# Patient Record
Sex: Male | Born: 1955
Health system: Southern US, Community
[De-identification: ages and names within clinical notes are randomized; demographics above are authoritative.]

## PROBLEM LIST (undated history)

## (undated) DIAGNOSIS — G4733 Obstructive sleep apnea (adult) (pediatric): Secondary | ICD-10-CM

## (undated) DIAGNOSIS — J45909 Unspecified asthma, uncomplicated: Secondary | ICD-10-CM

## (undated) DIAGNOSIS — E039 Hypothyroidism, unspecified: Secondary | ICD-10-CM

## (undated) DIAGNOSIS — I82409 Acute embolism and thrombosis of unspecified deep veins of unspecified lower extremity: Secondary | ICD-10-CM

## (undated) DIAGNOSIS — A078 Other specified protozoal intestinal diseases: Secondary | ICD-10-CM

## (undated) DIAGNOSIS — Z9289 Personal history of other medical treatment: Secondary | ICD-10-CM

## (undated) DIAGNOSIS — B354 Tinea corporis: Secondary | ICD-10-CM

## (undated) DIAGNOSIS — I498 Other specified cardiac arrhythmias: Secondary | ICD-10-CM

## (undated) DIAGNOSIS — E119 Type 2 diabetes mellitus without complications: Secondary | ICD-10-CM

## (undated) DIAGNOSIS — M1990 Unspecified osteoarthritis, unspecified site: Secondary | ICD-10-CM

## (undated) DIAGNOSIS — G5600 Carpal tunnel syndrome, unspecified upper limb: Secondary | ICD-10-CM

## (undated) DIAGNOSIS — F17200 Nicotine dependence, unspecified, uncomplicated: Secondary | ICD-10-CM

## (undated) DIAGNOSIS — T7840XA Allergy, unspecified, initial encounter: Secondary | ICD-10-CM

## (undated) DIAGNOSIS — E785 Hyperlipidemia, unspecified: Secondary | ICD-10-CM

## (undated) DIAGNOSIS — N419 Inflammatory disease of prostate, unspecified: Secondary | ICD-10-CM

## (undated) DIAGNOSIS — K219 Gastro-esophageal reflux disease without esophagitis: Secondary | ICD-10-CM

## (undated) DIAGNOSIS — G4730 Sleep apnea, unspecified: Secondary | ICD-10-CM

## (undated) HISTORY — DX: Personal history of other medical treatment: Z92.89

## (undated) HISTORY — DX: Other specified cardiac arrhythmias: I49.8

## (undated) HISTORY — DX: Inflammatory disease of prostate, unspecified: N41.9

## (undated) HISTORY — DX: Gastro-esophageal reflux disease without esophagitis: K21.9

## (undated) HISTORY — DX: Unspecified osteoarthritis, unspecified site: M19.90

## (undated) HISTORY — DX: Obstructive sleep apnea (adult) (pediatric): G47.33

## (undated) HISTORY — PX: COLONOSCOPY: SHX174

## (undated) HISTORY — DX: Type 2 diabetes mellitus without complications: E11.9

## (undated) HISTORY — DX: Allergy, unspecified, initial encounter: T78.40XA

## (undated) HISTORY — DX: Hypothyroidism, unspecified: E03.9

## (undated) HISTORY — DX: Tinea corporis: B35.4

## (undated) HISTORY — DX: Sleep apnea, unspecified: G47.30

## (undated) HISTORY — DX: Other specified protozoal intestinal diseases: A07.8

## (undated) HISTORY — DX: Unspecified asthma, uncomplicated: J45.909

## (undated) HISTORY — DX: Nicotine dependence, unspecified, uncomplicated: F17.200

## (undated) HISTORY — DX: Acute embolism and thrombosis of unspecified deep veins of unspecified lower extremity: I82.409

## (undated) HISTORY — DX: Carpal tunnel syndrome, unspecified upper limb: G56.00

## (undated) HISTORY — DX: Hyperlipidemia, unspecified: E78.5

## (undated) HISTORY — PX: TONSILLECTOMY: SUR1361

## (undated) HISTORY — PX: BILATERAL KNEE ARTHROSCOPY: SUR91

## (undated) HISTORY — PX: UPPER GASTROINTESTINAL ENDOSCOPY: SHX188

---

## 1998-07-01 ENCOUNTER — Ambulatory Visit (HOSPITAL_COMMUNITY): Admission: RE | Admit: 1998-07-01 | Discharge: 1998-07-01 | Payer: Self-pay | Admitting: Surgery

## 1998-07-01 ENCOUNTER — Encounter: Payer: Self-pay | Admitting: Surgery

## 1998-08-05 ENCOUNTER — Encounter: Payer: Self-pay | Admitting: Surgery

## 1998-08-09 ENCOUNTER — Ambulatory Visit (HOSPITAL_COMMUNITY): Admission: RE | Admit: 1998-08-09 | Discharge: 1998-08-09 | Payer: Self-pay | Admitting: Surgery

## 1999-01-17 ENCOUNTER — Other Ambulatory Visit: Admission: RE | Admit: 1999-01-17 | Discharge: 1999-01-17 | Payer: Self-pay | Admitting: Orthopedic Surgery

## 2000-05-16 ENCOUNTER — Emergency Department (HOSPITAL_COMMUNITY): Admission: EM | Admit: 2000-05-16 | Discharge: 2000-05-16 | Payer: Self-pay | Admitting: Emergency Medicine

## 2000-05-16 ENCOUNTER — Encounter: Payer: Self-pay | Admitting: Emergency Medicine

## 2000-10-29 ENCOUNTER — Emergency Department (HOSPITAL_COMMUNITY): Admission: EM | Admit: 2000-10-29 | Discharge: 2000-10-29 | Payer: Self-pay | Admitting: Emergency Medicine

## 2002-05-14 HISTORY — PX: ROTATOR CUFF REPAIR: SHX139

## 2003-05-15 HISTORY — PX: PARATHYROIDECTOMY: SHX19

## 2004-05-14 HISTORY — PX: TOTAL HIP ARTHROPLASTY: SHX124

## 2004-05-14 HISTORY — PX: OTHER SURGICAL HISTORY: SHX169

## 2004-12-21 ENCOUNTER — Inpatient Hospital Stay (HOSPITAL_COMMUNITY): Admission: RE | Admit: 2004-12-21 | Discharge: 2004-12-24 | Payer: Self-pay | Admitting: Orthopedic Surgery

## 2005-10-04 ENCOUNTER — Emergency Department (HOSPITAL_COMMUNITY): Admission: EM | Admit: 2005-10-04 | Discharge: 2005-10-04 | Payer: Self-pay | Admitting: Emergency Medicine

## 2007-02-12 ENCOUNTER — Emergency Department (HOSPITAL_COMMUNITY): Admission: EM | Admit: 2007-02-12 | Discharge: 2007-02-13 | Payer: Self-pay | Admitting: Emergency Medicine

## 2007-05-15 HISTORY — PX: OTHER SURGICAL HISTORY: SHX169

## 2007-05-15 HISTORY — PX: TOTAL HIP ARTHROPLASTY: SHX124

## 2007-12-02 ENCOUNTER — Inpatient Hospital Stay (HOSPITAL_COMMUNITY): Admission: RE | Admit: 2007-12-02 | Discharge: 2007-12-07 | Payer: Self-pay | Admitting: Orthopedic Surgery

## 2009-01-28 ENCOUNTER — Ambulatory Visit (HOSPITAL_COMMUNITY): Admission: RE | Admit: 2009-01-28 | Discharge: 2009-01-28 | Payer: Self-pay | Admitting: Orthopedic Surgery

## 2010-09-26 NOTE — Op Note (Signed)
NAMEBRAXTON, [name]               ACCOUNT NO.:  [id number]   MEDICAL RECORD NO.:  [id number]          PATIENT TYPE:  INP   LOCATION:  0003                         FACILITY:  [facility]   PHYSICIAN:  Deidre Ala, M.D.    DATE OF BIRTH:  January 13, 1956   DATE OF PROCEDURE:  12/02/2007  DATE OF DISCHARGE:                               OPERATIVE REPORT   PREOPERATIVE DIAGNOSIS:  End-stage degenerative joint disease left hip.   POSTOPERATIVE DIAGNOSIS:  End-stage degenerative joint disease left hip.   PROCEDURE:  Left total hip arthroplasty using metal-on-metal DePuy  components ASR uncemented.   SURGEON:  1. Charlesetta Delacruz, M.D.   ASSISTANT:  Phineas Semen, P.A.-C.   ANESTHESIA:  General with endotracheal.   CULTURES:  None.   DRAINS:  Two medium Hemovacs.   ESTIMATED BLOOD LOSS:  700 mL.   REPLACED:  Without.   PATHOLOGIC FINDINGS AND HISTORY:  Kyle Delacruz has had a longstanding history  of end-stage DJD left hip, finally getting to the symptomatic point  where he could not stand it any longer.  He had a successful right total  hip arthroplasty about 3 years ago using DePuy components with a ceramic  on poly head with a 10 degree liner.  We achieved excellent stability  with similar sizing on the left using metal-on-metal ASR cup size 56, a  size 49 femoral head and a #4 stem with the appropriate anteversion on  the cup and flexion as well as on the femoral component with no push-  pull instability and with hip flexion and internal rotation stability up  to 75-80 degrees.   PROCEDURE:  With adequate anesthesia obtained using endotracheal  technique, 2 grams Ancef given IV prophylaxis and Foley catheter placed,  the patient was placed in the right lateral decubitus position with the  left side up with the hip positioner.  After standard prepping and  draping, a posterior Romilda Joy type incision was made.  Incision was  deepened sharply with a knife and hemostasis attained using the  Bovie  electrocoagulator.  Dissection was carried down through the rather thick  subcutaneum to the iliotibial band and gluteus fascia which was incised  longitudinally.  We then placed Charnley retractor and retractors with  Homan's and cobra's around the hip.  We then identified the piriformis,  tagged it and released it.  I then removed the short external rotators,  then tagged the hip capsule and took it off at the neck and reflected it  medially.  We then dislocated the femoral head.  Using the template for  the neck cut, we made a neck cut at 1 fingerbreadth in the appropriate  angle and took out the femoral head.  We then exposed the acetabulum and  a labrectomy was carried out.  To wing retractors were placed and later  removed.  We then further debrided labrum and any additional material  and started reaming and reamed up to a 55.  The 56 cup then barely did  not bottom out with the 56 trial so we in appropriate anteversion and  flexion, impacted in  the ASR cup with good fit and tightness.  Then  exposed the proximal femur, used the starter reamer, lateralizing  reamer, canal finder and subsequently reamed up to a 4.5 and broached to  a 4 with the appropriate anteversion.  We trialed with a +5 neck which  we had done on the other side with excellent stability.  Trial  components were then removed.  We then irrigated the canal and cleared  the prosthetic acetabulum.  I then implanted the femoral stem 12/14  taper, DuoFix high offset 140 mm size 4 stem in the appropriate  anteversion and impacted it down.  I then placed on the size 49 head and  articulated the hip through the range of motion with the above listed  findings.  Additional irrigation was carried out with jet lavage on the  adipose sidewalls and wounds.  Hemovac drains were placed deep and  brought out the distal portals.  The wound was then closed in layers on  the iliotibial band and gluteus fascia with #1 Vicryl  vertical mattress  sutures, then a layer of 0 quills and 2-0 quills on the deep subcu, some  3-0 Vicryl and skin staples.  We then charged one limb of the Hemovac  into the wound with 20 mL of a mixture with 40 mg of gentamicin in that  amount.  Bulky sterile compressive dressing was applied.  Hemovac was  attached but not charged and will be charged 2 hours later with rubber  shod clamp put in place so that the antibiotic will be in the wound.  A  knee immobilizer was placed on the left leg.  The patient then, having  the procedure well, was awakened and taken to the recovery room in  satisfactory condition for routine postoperative care and management.           ______________________________  Kyle Delacruz, M.D.     VEP/MEDQ  D:  12/02/2007  T:  12/02/2007  Job:  [id number]   cc:   Dr. ___________

## 2010-09-26 NOTE — Consult Note (Signed)
Kyle Delacruz, Kyle Delacruz               ACCOUNT NO.:  [id number]   MEDICAL RECORD NO.:  [id number]          PATIENT TYPE:  INP   LOCATION:  1540                         FACILITY:  [facility]   PHYSICIAN:  Isidor Holts, M.D.  DATE OF BIRTH:  [birth year]   DATE OF CONSULTATION:  12/04/2007  DATE OF DISCHARGE:                                 CONSULTATION   PRIMARY MEDICAL DOCTOR:  Dr. Charlesetta Shanks.   REASON FOR CONSULTATION:  Tachycardia.   HISTORY OF PRESENT ILLNESS:  This is a 55 year old male. For past  medical history, see below.  The patient was admitted on December 02, 2007  for end-stage osteoarthritis of the left hip.  He is now status post  left total hip arthroplasty December 02, 2007.  We were requested to consult  for tachycardia up to 140 per minute, without chest pain, this p.m.   PAST MEDICAL HISTORY:  1. Osteoarthritis status post right hip arthroplasty August 2006.      Status post bilateral knee arthroscopies.  2. Gout.  3. Dyslipidemia.  4. Primary hyperparathyroidism, status post partial parathyroidectomy      in 2004.  5. GERD.  6. Low back pain/radiculopathy.  7. Smoking history.   PREADMISSION MEDICATIONS:  1. Lipitor 20 mg p.o. daily.  2. Allopurinol 300 mg p.o. daily.  3. Nexium 40 mg p.o. daily.  4. Neurontin 600 mg p.o. nightly.  5. Etodolac 300 mg p.o. daily.   CURRENT MEDICATIONS:  1. Colace 100 mg p.o. b.i.d.  2. Senna 1 p.o. b.i.d.  3. Zocor 40 mg p.o. nightly.  4. Protonix 80 mg p.o. nightly.  5. Ferrous sulfate 325 mg p.o. t.i.d.  6. Neurontin 600 mg p.o. nightly.  7. Allopurinol 300 mg p.o. daily.  8. Dilaudid discontinued December 03, 2007.   ALLERGIES:  PENICILLIN.   REVIEW OF SYSTEMS:  The patient complains of constipation.  As it turns  out, he was being seen by the physical therapist this p.m., and on  attempting to get up, developed tachycardia, which was documented to be  140 per minute.  He denies any chest pain or shortness of breath at that  time, and has had no further symptoms since.   SOCIAL HISTORY:  The patient is a smoker.  Smokes about 4 cigarettes a  day since age 42 years.   FAMILY HISTORY:  Noncontributory.   PHYSICAL EXAMINATION:  VITAL SIGNS:  Temperature 99.9, pulse 106 per  minute and regular, respirations 18, BP 110/73 mmHg.  Pulse oxymetry 98%  on room air.  CONSTITUTIONAL:  The patient did not appear to be in acute discomfort at  the time of this evaluation.  Alert and communicative.  HEENT:  Mild pallor.  No jaundice.  No conjunctival injection.  Hydration status is fair.  NECK:  Supple.  JVP is not seen.  No palpable lymphadenopathy.  No  palpable goiter.  CHEST:  Completely clear to auscultation.  No wheezes or crackles.  HEART:  Sounds S1 and S2 heard.  Normal, regular.  No murmurs.  ABDOMEN:  Full, soft, mild epigastric discomfort.  No palpable  organomegaly.  No palpable masses.  Normal bowel sounds.  LOWER EXTREMITIES:  The patient has ice pack over the left hip at the  surgical site, a mild postoperative edema of the left thigh.  Right  lower extremity appears unremarkable.  CNS:  There was no focal neurologic deficit on gross examination.   INVESTIGATIONS:  CBC, WBC 11.8, hemoglobin 10.5, hematocrit 30.5,  platelets 172.  Electrolytes sodium 135, potassium 3.8, chloride 100,  CO2 27, BUN 7, creatinine 0.9, glucose 132.   ASSESSMENT AND PLAN/RECOMMENDATIONS:  1. Transient sinus tachycardia.  This occurred on getting up to work      with PT/OT, and appears to have resolved.  The patient has no      symptoms of shortness of breath or chest pain.  A 12-lead EKG shows      sinus rhythm, regular, 107 per minute.  Normal axis.  No acute      ischemic changes.  The patient has already been started on      intravenous fluids, which I agree with, although clinically and per      BUN/creatinine, does not appear to be dehydrated.  Differential      diagnostic considerations, however, include myocardial  ischemia,      pulmonary embolism, and deconditioning.  We shall, therefore, do a      chest x-ray, chest CT, angiogram, cycle cardiac enzymes, do TSH,      and observe for now.   1. Constipation.  This likely secondary to opioid analgesics.  We      shall add Miralax to the patient's laxatives.   1. Gastroesophageal reflux disease.  The patient is currently on high      dose proton pump inhibitor, which we shall continue.   1. Dyslipidemia.  The patient is on a Statin.   Note:  I do not think telemetric monitoring is indicated at this time.  We shall follow up with you.  Thank you for the consultation.      Isidor Holts, M.D.  Electronically Signed    CO/MEDQ  D:  12/04/2007  T:  12/04/2007  Job:  [id number]   cc:   Dr. Charlesetta Shanks

## 2010-09-29 NOTE — Discharge Summary (Signed)
NAMEBOYDE, GRIECO               ACCOUNT NO.:  [id number]   MEDICAL RECORD NO.:  [id number]          PATIENT TYPE:  INP   LOCATION:  5031                         FACILITY:  [facility]   PHYSICIAN:  Deidre Ala, M.D.    DATE OF BIRTH:  05/09/1956   DATE OF ADMISSION:  12/21/2004  DATE OF DISCHARGE:  12/24/2004                                 DISCHARGE SUMMARY   ADMISSION DIAGNOSES:  1.  End-stage osteoarthritis of the right hip.  2.  Hypercholesterolemia.  3.  History of gout.   DISCHARGE DIAGNOSES:  1.  End-stage osteoarthritis of the right hip.  2.  Hypercholesterolemia.  3.  History of gout.  4.  Postoperative hemorrhagic anemia stable at the time of discharge.   PROCEDURE:  Patient was taken to the operating room on December 21, 2004 and  underwent a right total hip arthroplasty DePuy type.  Surgeon was Dr. Charlesetta Shanks.  Assistant was [employer], P.A.-C.  Surgery was done under general  anesthesia.  A Hemovac drain x1 was placed at the time of surgery.   CONSULTS:  1.  Physical therapy  2.  Occupational therapy  3.  Social work case management   BRIEF HISTORY:  Patient is a 55 year old male with a history of right hip  pain.  Radiographs were taken in the office which showed basically bone on  bone contact of the right hip.  Upon reviewing these findings and failure of  conservative treatment and patient's continued discomfort Dr. Renae Fickle felt it  was best to proceed with a right total hip arthroplasty.  The patient  agreed.  The risks and benefits of the surgery were discussed with the  patient and patient wished to proceed.   LABORATORY DATA:  CBC on admission showed a hemoglobin of 14.6, hematocrit  43.3, white blood cell count 8.1, red blood cell count 4.71.  Serial H&Hs  were followed throughout hospital stay.  Hemoglobin and hematocrit did  decline to 10.9 and 30.4, respectively at the time of discharge and were  stable.  Differential on admission was all within  normal limits.  Coagulation studies on admission were all within normal limits.  PT/INR at  the time of were 17.5 and 1.4, respectively on Coumadin therapy.  Routine  chemistry on admission showed glucose high at 108.  Serial chemistries were  followed throughout hospital stay.  Sodium declined to 134 on August 11,  back in the normal range the following day and then declined to 132 on  August 13, but was stable at the time of discharge.  Glucose ranged from a  low of 108 at the time of admission to a high of 148.  BUN fell to a low of  5, but was stable at the time of discharge.  Urinalysis on admission showed  moderate amount of bilirubin.  Patient's blood type is B+ with antibody  screen negative.  EKG showed normal sinus rhythm with a normal EKG.  Postoperative hip films showed no complications status post right total hip  arthroplasty with severe left-sided osteoarthritis.   HOSPITAL COURSE:  The  patient was admitted to [facility] and taken  to the operating room.  He underwent the above stated procedure without  complications.  Patient tolerated procedure well.  Allowed to return to  recovery room, orthopedic floor for continued postoperative care.  Postoperative day #1 patient was resting comfortably.  Tmax was 102.4.  Hemoglobin and hematocrit were 11.7 and 33.5.  His dressing was clean, dry,  and intact.  Patient worked with physical therapy and occupational therapy.  Postoperative day #2 patient was doing okay.  Tmax was 101.3.  Hemoglobin  and hematocrit were 10.6 and 30.9.  Foley drain and PCA were discontinued at  this date.  Dressing was changed.  August 13 postoperative day #3 patient  was doing well.  Had progressed well with therapy and was ready to go home.  Tmax was 101.3.  Hemoglobin and hematocrit 10.5 and 30.4.  Incision was  clean, dry, and intact.  He was neurovascularly intact to right lower  extremity.  Patient was discharged home on this date.    DISPOSITION:  Patient was discharged home on December 24, 2004.   DISCHARGE MEDICATIONS:  1.  Percocet 5/325 one to two p.o. q.4-6h. p.r.n. pain.  2.  Robaxin 750 mg one p.o. q.6-8h. p.r.n. spasm.  3.  Coumadin per pharmacy protocol.   DIET:  As tolerated.   ACTIVITY:  Patient is partial weightbearing to the right lower extremity.   WOUND CARE:  Patient is to perform daily dressing changes __________ no  drainage.  He may shower when there is no drainage.   FOLLOW-UP:  Patient is to follow up with Dr. Renae Fickle two weeks from the date of  surgery.  He is to call the office for an appointment at [phone].   CONDITION ON DISCHARGE:  Stable and improved.      Clarene Reamer, P.A.-C.    ______________________________  Seth Bake. Charlesetta Shanks, M.D.    SW/MEDQ  D:  02/23/2005  T:  02/23/2005  Job:  [id number]

## 2010-09-29 NOTE — Op Note (Signed)
NAMEHUEY, SCALIA               ACCOUNT NO.:  [id number]   MEDICAL RECORD NO.:  [id number]          PATIENT TYPE:  INP   LOCATION:  2856                         FACILITY:  [facility]   PHYSICIAN:  Deidre Ala, M.D.    DATE OF BIRTH:  May 02, 1956   DATE OF PROCEDURE:  12/21/2004  DATE OF DISCHARGE:                                 OPERATIVE REPORT   PREOPERATIVE DIAGNOSIS:  End stage degenerative joint disease, right hip.   POSTOPERATIVE DIAGNOSIS:  End stage degenerative joint disease, right hip.   OPERATION PERFORMED:  Right total hip arthroplasty using Depuy uncemented  components, metal back acetabulum, poly liner with lip and  HA coated stem  with ceramic head.   SURGEON:  1.  Charlesetta Shanks, M.D.   ASSISTANT:  Clarene Reamer, P.A.-C.   ANESTHESIA:  General endotracheal.   CULTURES:  None.   DRAINS:  Two medium Hemovacs to self suction.   ESTIMATED BLOOD LOSS:  400 mL.  Replacement without.   PATHOLOGIC FINDINGS AND HISTORY:  Kyle Delacruz has had a long term history of  bilateral hip and osteoarthritis, mitigated and actually enhanced somewhat  by gout.  At surgery, he was a very stout fellow with extremely hard  subchondral bone that made his case somewhat laborious.  We did end up with  appropriate geometry, placing in a 56 acetabular cup, Pinnacle type. We  elected not to go metal on metal because I felt his geometry was not quite  enough with his tight wound.  In order to get the pure geometry I would like  so that when he was in flexion, internal rotation, he came out at about 45  degrees with a standard lip, so was added an acetabular liner lip 20  degrees, dialed at 9 o'clock, 32 mm inner diameter, used a 32 mm ceramic  femoral head, a +5 tapered Summit stem size 4 and with the appropriate  anteversion with a very stable hip with flexion internal rotation up to  about 80 degrees.   DESCRIPTION OF PROCEDURE:  With adequate anesthesia obtained using  endotracheal technique,  1 g vancomycin given IV prophylaxis, the patient was  placed in left lateral decubitus position with the right side up.  After  standard prepping and draping of the right hip, the standard Romilda Joy  type hip procedure incision was made.  Incision was deepened sharply with a  knife and hemostasis obtained using Bovie electrocoagulator.  We then  dissected down to the iliotibial band and gluteus fibers and split them  longitudinally.  Charnley retractor was in place.  Key retractors were  placed around the hip. We then released the capsule with a tag as well as  the piriformis.  We then dislocated the hip and made our neck cut and  freshened it. We then exposed the acetabulum  doing a labrectomy.  We then  successively reamed up to a 55 for placement of a 56 cup.  We lightly  touched the rim with a 56 and then impacted in the appropriate geometry, the  acetabular cup, Pinnacle type with firm fit.  The subchondral bone was  extremely dense and it took a lot of reaming to get it to our position with  bleeding bone.  We then put the non-lipped liner in, then exposed the femur.  We used a starter reamer and then reamed up to a 4, broached to a 4 with  anteversion, placed the trials in, felt we needed more stability in internal  rotation and flexion, so then placed the lip liner in place, tied the 32 mm  head.  We were happy with this.  We then implanted the lipped acetabular  liner.  We then implanted the tapered stem and then trialed the head with a  +5 ceramic 32, then placed the ceramic head actual component in place,  tapped it on, reduced the hip with no push pull instability, full hip  extension with knee flexion, no instability in external rotation and  extension and hip flexion internal rotation brought up to about 80 degrees.  Thorough irrigation was carried out.  Hemovac drains were laid deep in the  wound.  The posterior capsule was brought back to the posterior abductors  and  trochanter with a drill hole and horizontal mattress suture made.  Wound  was then closed in layers with #1 Vicryl and #1 PDS on the fascial layer, 0  and 2-0 Vicyrl and #1 Vicryl running locking #1 0, 2-0 in order from deep to  superficial and then skin staples.  Hemovac hooked up to Autovac.  Bulky  sterile compressive dressing was applied.  The patient then had a knee  immobilizer placed to disallow hip flexion and the patient having tolerated  the procedure well was awakened and taken to recovery room in satisfactory  condition for routine postoperative care.       VEP/MEDQ  D:  12/21/2004  T:  12/21/2004  Job:  [id number]

## 2010-09-29 NOTE — Discharge Summary (Signed)
[name], Kyle Delacruz               ACCOUNT NO.:  [id number]   MEDICAL RECORD NO.:  [id number]          PATIENT TYPE:  INP   LOCATION:  1540                         FACILITY:  [facility]   PHYSICIAN:  Deidre Ala, M.D.    DATE OF BIRTH:  09-01-1955   DATE OF ADMISSION:  12/02/2007  DATE OF DISCHARGE:  12/07/2007                               DISCHARGE SUMMARY   FINAL DIAGNOSES:  1. Degenerative joint disease, right hip.  2. Gout.  3. Hyperlipidemia.  4. Primary hyperparathyroidism status post partial parathyroidectomy      in 2004.  5. Gastroesophageal reflux disease.  6. Low neck pain with radiculopathy.  7. History of smoking.   PROCEDURES:  December 02, 2007:  Total hip arthroplasty, left hip.   SURGEON:  1. Charlesetta Shanks, M.D.   HISTORY:  This is a 55 year old African American male patient of Dr.  Ollen Gross who was diagnosed as having DJD of the left hip.  He has a  positive history of injury to his right hip in the past and had total  hip arthroplasty on that side.  He has had right rotator cuff repair in  the past.  He reported his hip pain finally got to the point where  medical management is no longer helping and he is ready for a total hip  arthroplasty.  He was subsequently scheduled and on December 02, 2007,  admitted to [facility] for a total hip arthroplasty of the  left hip.  The patient tolerated the procedure well.  No intraoperative  complications occurred.  The patient's hospital course was complicated  only by transient tachycardia.  Consultation was done and Dr.  Isidor Holts saw this patient.  Workup was noted for his tachycardia  which did resolve any further problems.  No other untoward events  occurred during his stay.  He did not have to receive any blood  products.  He was working with Physical Therapy and was up and walking  by the 3rd postoperative day.  He continued to do well.  On July 26th,  he was ready for discharge.  At that time, the patient was  doing well.  He was having no complications.   DISCHARGE MEDICATIONS:  1. Lipitor 20 mg daily.  2. Allopurinol 300 mg daily.  3. Nexium 40 mg daily.  4. Neurontin 600 mg nightly.  5. Etodolac 300 mg daily.  6. He will continue with Lovenox 40 mg subcu daily for the next 10      days.  7. He was given Percocet 500/325 1 to 2 p.o. q.4-6 hours p.r.n. for      pain.  8. He was given Robaxin 500 mg 1 p.o. q.i.d. p.r.n.  for muscle spasm.   DISCHARGE FOLLOWUP:  He is to follow up with Dr. Renae Fickle in 10 days.  He is  subsequently discharged home in satisfactory condition on 12/07/2007.      Phineas Semen, P.A.    ______________________________  Seth Bake. Charlesetta Shanks, M.D.    CL/MEDQ  D:  12/30/2007  T:  12/30/2007  Job:  [id number]

## 2011-02-09 LAB — CBC
HCT: 30.5 — ABNORMAL LOW
HCT: 32.1 — ABNORMAL LOW
HCT: 41.8
Hemoglobin: 10.5 — ABNORMAL LOW
Hemoglobin: 11.2 — ABNORMAL LOW
Hemoglobin: 9.4 — ABNORMAL LOW
MCHC: 33.7
MCHC: 33.9
MCHC: 34.6
MCV: 95.1
MCV: 96
Platelets: 172
Platelets: 201
Platelets: 211
RBC: 2.9 — ABNORMAL LOW
RBC: 3.42 — ABNORMAL LOW
RDW: 12.8
RDW: 13.6
RDW: 13.7
RDW: 13.8
WBC: 10.9 — ABNORMAL HIGH

## 2011-02-09 LAB — BASIC METABOLIC PANEL
BUN: 7
CO2: 27
CO2: 29
Calcium: 8.4
Calcium: 8.6
Chloride: 100
Creatinine, Ser: 0.77
GFR calc Af Amer: >60
GFR calc Af Amer: >60
GFR calc non Af Amer: >60
GFR calc non Af Amer: >60
GFR calc non Af Amer: >60
Glucose, Bld: 132 — ABNORMAL HIGH
Glucose, Bld: 135 — ABNORMAL HIGH
Potassium: 3.8
Potassium: 4
Sodium: 135
Sodium: 136
Sodium: 138

## 2011-02-09 LAB — URINALYSIS, ROUTINE W REFLEX MICROSCOPIC
Glucose, UA: NEGATIVE
Hgb urine dipstick: NEGATIVE
Ketones, ur: NEGATIVE
Protein, ur: NEGATIVE

## 2011-02-09 LAB — APTT: aPTT: 25

## 2011-02-09 LAB — URINE CULTURE

## 2011-02-09 LAB — ABO/RH: ABO/RH(D): AB POS

## 2011-02-09 LAB — DIFFERENTIAL
Basophils Absolute: 0
Lymphocytes Relative: 30
Monocytes Absolute: 0.5
Monocytes Relative: 7
Neutro Abs: 4.9
Neutrophils Relative %: 57

## 2011-02-09 LAB — CARDIAC PANEL(CRET KIN+CKTOT+MB+TROPI)
CK, MB: 1.2
CK, MB: 1.4
CK, MB: 1.8
Relative Index: 0.6
Total CK: 197
Total CK: 273 — ABNORMAL HIGH
Troponin I: 0.02
Troponin I: <0.01

## 2011-02-09 LAB — COMPREHENSIVE METABOLIC PANEL
Albumin: 4
BUN: 13
Creatinine, Ser: 0.96
Total Protein: 6.5

## 2011-02-09 LAB — TYPE AND SCREEN: ABO/RH(D): AB POS

## 2011-02-09 LAB — PROTIME-INR: INR: 0.9

## 2011-02-22 LAB — URINALYSIS, ROUTINE W REFLEX MICROSCOPIC
Ketones, ur: NEGATIVE
Nitrite: NEGATIVE
Specific Gravity, Urine: 1.02
pH: 5.5

## 2011-02-22 LAB — DIFFERENTIAL
Eosinophils Relative: 5
Lymphocytes Relative: 34
Lymphs Abs: 3.3

## 2011-02-22 LAB — CBC
HCT: 42.8
Platelets: 218
WBC: 9.7

## 2011-02-22 LAB — BASIC METABOLIC PANEL
BUN: 8
GFR calc non Af Amer: >60
Potassium: 3.8
Sodium: 137

## 2011-06-13 DIAGNOSIS — I10 Essential (primary) hypertension: Secondary | ICD-10-CM | POA: Diagnosis present

## 2011-06-27 ENCOUNTER — Encounter: Payer: Self-pay | Admitting: Gastroenterology

## 2011-07-10 ENCOUNTER — Encounter: Payer: Self-pay | Admitting: Gastroenterology

## 2011-07-16 ENCOUNTER — Encounter: Payer: Self-pay | Admitting: Gastroenterology

## 2011-07-16 ENCOUNTER — Ambulatory Visit (AMBULATORY_SURGERY_CENTER): Payer: Federal, State, Local not specified - PPO | Admitting: *Deleted

## 2011-07-16 VITALS — Ht 70.0 in | Wt 246.3 lb

## 2011-07-16 DIAGNOSIS — Z1211 Encounter for screening for malignant neoplasm of colon: Secondary | ICD-10-CM

## 2011-07-16 MED ORDER — PEG-KCL-NACL-NASULF-NA ASC-C 100 G PO SOLR: ORAL | Status: DC

## 2011-07-30 ENCOUNTER — Encounter: Payer: Self-pay | Admitting: Gastroenterology

## 2011-07-30 ENCOUNTER — Ambulatory Visit (AMBULATORY_SURGERY_CENTER): Payer: Federal, State, Local not specified - PPO | Admitting: Gastroenterology

## 2011-07-30 VITALS — BP 148/93 | HR 69 | Temp 97.2°F | Resp 18 | Ht 70.0 in | Wt 246.0 lb

## 2011-07-30 DIAGNOSIS — Z1211 Encounter for screening for malignant neoplasm of colon: Secondary | ICD-10-CM

## 2011-07-30 DIAGNOSIS — K5730 Diverticulosis of large intestine without perforation or abscess without bleeding: Secondary | ICD-10-CM | POA: Insufficient documentation

## 2011-07-30 MED ORDER — SODIUM CHLORIDE 0.9 % IV SOLN: 500.0000 mL | INTRAVENOUS | Status: DC

## 2011-07-30 NOTE — Progress Notes (Signed)
Patient did not experience any of the following events: a burn prior to discharge; a fall within the facility; wrong site/side/patient/procedure/implant event; or a hospital transfer or hospital admission upon discharge from the facility. (G8907) Patient did not have preoperative order for IV antibiotic SSI prophylaxis. (G8918)  

## 2011-07-30 NOTE — Patient Instructions (Signed)

## 2011-07-30 NOTE — Op Note (Addendum)
[facility] [address] [place], [place]  [address]  COLONOSCOPY PROCEDURE REPORT  PATIENT:  Kyle Delacruz, Kyle Delacruz  MR#:  [id number] BIRTHDATE:  May 08, 1956, 56 yrs. old  GENDER:  male ENDOSCOPIST:  Vania Rea. Jarold Motto, MD, [facility] REF. BY: PROCEDURE DATE:  07/30/2011 PROCEDURE:  Average-risk screening colonoscopy G0121 ASA CLASS:  Class II INDICATIONS:  Routine Risk Screening MEDICATIONS:   propofol (Diprivan) 250 mg IV  DESCRIPTION OF PROCEDURE:   After the risks and benefits and of the procedure were explained, informed consent was obtained. Digital rectal exam was performed and revealed no abnormalities. The [id number] endoscope was introduced through the anus and advanced to the cecum, which was identified by both the appendix and ileocecal valve.  The quality of the prep was excellent, using MoviPrep.  The instrument was then slowly withdrawn as the colon was fully examined. <<PROCEDUREIMAGES>>  FINDINGS:  There were mild diverticular changes in left colon. diverticulosis was found.  This was otherwise a normal examination of the colon.   Retroflexed views in the rectum revealed no abnormalities.    The scope was then withdrawn from the patient and the procedure completed.  COMPLICATIONS:  None ENDOSCOPIC IMPRESSION: 1) Diverticulosis,mild,left sided diverticulosis 2) Otherwise normal examination RECOMMENDATIONS: 1) metamucil or benefiber 2) High fiber diet 3) Continue current colorectal screening recommendations for "routine risk" patients with a repeat colonoscopy in 10 years.  REPEAT EXAM:  No  ______________________________ Vania Rea. Jarold Motto, MD, [facility]  CC:  Chilton Greathouse, MD  n. REVISED:  07/30/2011 12:01 PM eSIGNED:   Vania Rea. Oseph Imburgia at 07/30/2011 12:01 PM  Felton Clinton, [id number]

## 2011-07-30 NOTE — Progress Notes (Signed)
Pt had a burst of sinus tach hr 122 and within seconds the pt converted to sinus rhythm at hr 68.  Dr. Jarold Motto was made aware of this and he spoke with the pt asking the pt if he has had any irregular of his heart or palpitations and the pt denied any sx. Maw

## 2011-07-31 ENCOUNTER — Telehealth: Payer: Self-pay | Admitting: *Deleted

## 2011-07-31 NOTE — Telephone Encounter (Signed)
  Follow up Call-  Call back number 07/30/2011  Post procedure Call Back phone  # [phone]/ [phone]  Permission to leave phone message Yes     No answer at number patient left in admitting.

## 2012-05-14 HISTORY — PX: KNEE SURGERY: SHX244

## 2012-08-08 ENCOUNTER — Other Ambulatory Visit: Payer: Self-pay | Admitting: Orthopedic Surgery

## 2012-08-08 DIAGNOSIS — M25551 Pain in right hip: Secondary | ICD-10-CM

## 2012-08-16 ENCOUNTER — Other Ambulatory Visit: Payer: Federal, State, Local not specified - PPO

## 2012-08-18 ENCOUNTER — Ambulatory Visit
Admission: RE | Admit: 2012-08-18 | Discharge: 2012-08-18 | Disposition: A | Discharge status: Home / Self Care | Payer: Federal, State, Local not specified - PPO | Source: Ambulatory Visit | Attending: Orthopedic Surgery | Admitting: Orthopedic Surgery

## 2012-08-18 DIAGNOSIS — M25551 Pain in right hip: Secondary | ICD-10-CM

## 2013-07-31 ENCOUNTER — Other Ambulatory Visit: Payer: Self-pay | Admitting: Orthopedic Surgery

## 2013-07-31 ENCOUNTER — Other Ambulatory Visit: Payer: Self-pay | Admitting: *Deleted

## 2013-07-31 DIAGNOSIS — M25552 Pain in left hip: Secondary | ICD-10-CM

## 2014-02-22 ENCOUNTER — Emergency Department (INDEPENDENT_AMBULATORY_CARE_PROVIDER_SITE_OTHER)
Admission: EM | Admit: 2014-02-22 | Discharge: 2014-02-22 | Disposition: A | Discharge status: Home / Self Care | Payer: Federal, State, Local not specified - PPO | Source: Home / Self Care | Attending: Emergency Medicine | Admitting: Emergency Medicine

## 2014-02-22 ENCOUNTER — Encounter (HOSPITAL_COMMUNITY): Payer: Self-pay | Admitting: Emergency Medicine

## 2014-02-22 DIAGNOSIS — T17208A Unspecified foreign body in pharynx causing other injury, initial encounter: Secondary | ICD-10-CM

## 2014-02-22 NOTE — Discharge Instructions (Signed)
Swallowed Foreign Body, Adult °You have swallowed an object (foreign body). Once the foreign body has passed through the food tube (esophagus), which leads from the mouth to the stomach, it will usually continue through the body without problems. This is because the point where the esophagus enters into the stomach is the narrowest place through which the foreign body must pass. °Sometimes the foreign body gets stuck. The most common type of foreign body obstruction in adults is food impaction. Many times, bones from fish or meat products may become lodged in the esophagus or injure the throat on the way down. When there is an object that obstructs the esophagus, the most obvious symptoms are pain and the inability to swallow normally. In some cases, foreign bodies that can be life threatening are swallowed. Examples of these are certain medications and illicit drugs. Often in these instances, patients are afraid of telling what they swallowed. However, it is extremely important to tell the emergency caregiver what was swallowed because life-saving treatment may be needed.  °X-ray exams may be taken to find the location of the foreign body. However, some objects do not show up well or may be too small to be seen on an X-ray image. If the foreign body is too large or too sharp, it may be too dangerous to allow it to pass on its own. You may need to see a caregiver who specializes in the digestive system (gastroenterologist). In a few cases, a specialist may need to remove the object using a method called "endoscopy".  This involves passing a thin, soft, flexible tube into the food pipe to locate and remove the object. Follow up with your primary doctor or the referral you were given by the emergency caregiver. °HOME CARE INSTRUCTIONS  °· If your caregiver says it is safe for you to eat, then only have liquids and soft foods until your symptoms improve. °· Once you are eating normally: °¨ Cut food into small  pieces. °¨ Remove small bones from food. °¨ Remove large seeds and pits from fruit. °¨ Chew your food well. °¨ Do not talk, laugh, or engage in physical activity while eating or swallowing. °SEEK MEDICAL CARE IF: °· You develop worsening shortness of breath, uncontrollable coughing, chest pains or high fever, greater than 102° F (38.9° C). °· You are unable to eat or drink or you feel that food is getting stuck in your throat. °· You have choking symptoms or cannot stop drooling. °· You develop abdominal pain, vomiting (especially of blood), or rectal bleeding. °MAKE SURE YOU:  °· Understand these instructions. °· Will watch your condition. °· Will get help right away if you are not doing well or get worse. °Document Released: 10/18/2009 Document Revised: 07/23/2011 Document Reviewed: 10/18/2009 °[employer]® Patient Information ©2015 [employer]. This information is not intended to replace advice given to you by your health care provider. Make sure you discuss any questions you have with your health care provider. ° °

## 2014-02-22 NOTE — ED Provider Notes (Signed)
  Chief Complaint   Swallowed Foreign Body   History of Present Illness   Kyle Delacruz is a 58 year old male who 3 days ago was biting off the corner of a bag of [employer] chicken wings when he accidentally swallowed a small piece of plastic and it seemed to hang in his throat. It feels like it's just the left of the midline. It hurts slightly to swallow although he is able to swallow food, liquids, and has no trouble speaking or breathing. He went to Dr. Berle Mull office today and was sent over here. He denies any hemoptysis or shortness of breath. He estimates that the piece of plastic was no better than half of the posterior sac.  Review of Systems   Other than as noted above, the patient denies any of the following symptoms: Systemic:  No fevers or chills. Eye:  No redness, pain, discharge, itching, blurred vision, or diplopia. ENT:  No headache, nasal congestion, sneezing, itching, epistaxis, ear pain, decreased hearing, ringing in ears, vertigo, or tinnitus.  No oral lesions, sore throat, or hoarseness. Neck:  No neck pain or adenopathy. Skin:  No rash or itching.  [place]   Past medical history, family history, social history, meds, and allergies were reviewed.   Physical Examination     Vital signs:  BP 134/86  Pulse 84  Temp(Src) 99.6 F (37.6 C) (Oral)  Resp 16  SpO2 98% General:  Alert and oriented.  In no distress.  Skin warm and dry. Eye:  PERRL, full EOMs, lids and conjunctiva normal.   ENT:  TMs and canals clear.  Nasal mucosa not congested and without drainage.  Mucous membranes moist, no oral lesions, normal dentition, pharynx clear.  Nose unable to see any visible foreign body or piece of plastic. No cranial or facial pain to palplation. There is no pain or swelling over the mastoid. Neck:  Supple, full ROM.  No adenopathy, tenderness or mass.  Thyroid normal. Lungs:  Breath sounds clear and equal bilaterally.  No wheezes, rales or rhonchi. Heart:  Rhythm  regular, without extrasystoles.  No gallops or murmers. Skin:  Clear, warm and dry.   Assessment   The encounter diagnosis was Foreign body in throat, initial encounter.  Dr. Melony Overly who was on-call for Dr. Scherry Ran was called. He did not feel that there was any need for emergently removing the plastic foreign body and suggested he call Dr. Berle Mull office tomorrow morning at 9 AM.  Plan    1.  Meds:  The following meds were prescribed:   Discharge Medication List as of 02/22/2014  6:12 PM      2.  Patient Education/Counseling:  The patient was given appropriate handouts, self care instructions, and instructed in symptomatic relief.  Suggested a soft diet tonight.  3.  Follow up:  The patient was told to follow up here if no better in 3 to 4 days, or sooner if becoming worse in any way, and given some red flag symptoms such as difficulty breathing which would prompt immediate return.       Harden Mo, MD 02/22/14 [id number]

## 2014-02-22 NOTE — ED Notes (Signed)
Opening package of [employer] frozen chicken wings and swallowed a small piece of it on Saturday at 1700.  No cough.  Still feels something in his L side of his neck.  Tried eating bread and drinking fluids without relief.

## 2014-04-26 ENCOUNTER — Encounter (HOSPITAL_COMMUNITY): Payer: Self-pay | Admitting: Emergency Medicine

## 2014-04-26 ENCOUNTER — Emergency Department (HOSPITAL_COMMUNITY)
Admission: EM | Admit: 2014-04-26 | Discharge: 2014-04-26 | Disposition: A | Discharge status: Home / Self Care | Payer: Federal, State, Local not specified - PPO | Source: Home / Self Care | Attending: Emergency Medicine | Admitting: Emergency Medicine

## 2014-04-26 DIAGNOSIS — N451 Epididymitis: Secondary | ICD-10-CM

## 2014-04-26 LAB — POCT URINALYSIS DIP (DEVICE)
Bilirubin Urine: NEGATIVE
Glucose, UA: NEGATIVE mg/dL
Hgb urine dipstick: NEGATIVE
KETONES UR: NEGATIVE mg/dL
LEUKOCYTES UA: NEGATIVE
NITRITE: NEGATIVE
PH: 5.5 (ref 5.0–8.0)
PROTEIN: 100 mg/dL — AB
Specific Gravity, Urine: >=1.03 (ref 1.005–1.030)
UROBILINOGEN UA: 0.2 mg/dL (ref 0.0–1.0)

## 2014-04-26 MED ORDER — DOXYCYCLINE HYCLATE 100 MG PO CAPS: 100.0000 mg | ORAL_CAPSULE | Freq: Two times a day (BID) | ORAL | Status: DC

## 2014-04-26 NOTE — Discharge Instructions (Signed)
Epididymitis Epididymitis is a swelling (inflammation) of the epididymis. The epididymis is a cord-like structure along the back part of the testicle. Epididymitis is usually, but not always, caused by infection. This is usually a sudden problem beginning with chills, fever and pain behind the scrotum and in the testicle. There may be swelling and redness of the testicle. DIAGNOSIS  Physical examination will reveal a tender, swollen epididymis. Sometimes, cultures are obtained from the urine or from prostate secretions to help find out if there is an infection or if the cause is a different problem. Sometimes, blood work is performed to see if your white blood cell count is elevated and if a germ (bacterial) or viral infection is present. Using this knowledge, an appropriate medicine which kills germs (antibiotic) can be chosen by your caregiver. A viral infection causing epididymitis will most often go away (resolve) without treatment. HOME CARE INSTRUCTIONS   Ibuprofen for pain as needed.  Hot sitz baths for 20 minutes, 4 times per day, may help relieve pain.  Only take over-the-counter or prescription medicines for pain, discomfort or fever as directed by your caregiver.  Take all medicines, including antibiotics, as directed. Take the antibiotics for the full prescribed length of time even if you are feeling better.  It is very important to keep all follow-up appointments. SEEK IMMEDIATE MEDICAL CARE IF:   You have a fever.  You have pain not relieved with medicines.  You have any worsening of your problems.  Your pain seems to come and go.  You develop pain, redness, and swelling in the scrotum and surrounding areas. MAKE SURE YOU:   Understand these instructions.  Will watch your condition.  Will get help right away if you are not doing well or get worse. Document Released: 04/27/2000 Document Revised: 07/23/2011 Document Reviewed: 03/17/2009 [facility] Patient Information 2015  [place], [place]. This information is not intended to replace advice given to you by your health care provider. Make sure you discuss any questions you have with your health care provider.

## 2014-04-26 NOTE — ED Provider Notes (Signed)
CSN: [id number]     Arrival date & time 04/26/14  1029 History   First MD Initiated Contact with Patient 04/26/14 1144     Chief Complaint  Patient presents with  . Groin Pain   (Consider location/radiation/quality/duration/timing/severity/associated sxs/prior Treatment) HPI Comments: 58 year old male is complaining of pain in the groin with a gradual onset for 4 days. States he has been doing more sit-ups lately and this has been making it worse. He denies penile pain. Denies penile discharge. Denies dysuria. He has had some gradual increasing urinary frequency for 1 month. He has a history of prostatitis but states this does not feel like that episode that he had in 2011. Denies trauma.   Past Medical History  Diagnosis Date  . GERD (gastroesophageal reflux disease)   . Hyperlipidemia   . Gout   . Sleep apnea     cpap   Past Surgical History  Procedure Laterality Date  . Left hip replacement  2009  . Right hip replacement  2006  . Bilateral knee arthroscopy      right- 1997; left 2001  . Rotator cuff repair  2004    right  . Parathyroidectomy  2005  . Colonoscopy      screening, 2002  . Knee surgery Right 2014    torn R medial meniscus- arthroscopy done   Family History  Problem Relation Age of Onset  . Colon cancer Neg Hx   . Stomach cancer Neg Hx   . Stroke Mother   . Diabetes Father    History  Substance Use Topics  . Smoking status: Current Every Day Smoker -- 0.50 packs/day    Types: Cigarettes  . Smokeless tobacco: Never Used  . Alcohol Use: 8.4 oz/week    14 Cans of beer per week    Review of Systems  Constitutional: Positive for activity change. Negative for fever and fatigue.  Gastrointestinal: Negative.   Genitourinary: Positive for frequency and testicular pain. Negative for dysuria, urgency, hematuria, decreased urine volume, discharge, penile swelling, scrotal swelling, difficulty urinating and penile pain.  All other systems reviewed and are  negative.   Allergies  Iohexol and Penicillins  Home Medications   Prior to Admission medications   Medication Sig Start Date End Date Taking? Authorizing Provider  allopurinol (ZYLOPRIM) 300 MG tablet Take 300 mg by mouth daily.  06/30/11   Historical Provider, MD  aspirin 81 MG tablet Take 81 mg by mouth daily.    Historical Provider, MD  CRESTOR 20 MG tablet Take 20 mg by mouth daily.  06/30/11   Historical Provider, MD  doxycycline (VIBRAMYCIN) 100 MG capsule Take 1 capsule (100 mg total) by mouth 2 (two) times daily. 04/26/14   Janne Napoleon, NP  fluticasone (FLONASE) 50 MCG/ACT nasal spray Place 1 spray into the nose 2 (two) times daily.  06/30/11   Historical Provider, MD  hyoscyamine (LEVSIN SL) 0.125 MG SL tablet Take 0.125 mg by mouth every 6 (six) hours as needed.  07/10/11   Historical Provider, MD  levothyroxine (SYNTHROID, LEVOTHROID) 100 MCG tablet Take 100 mcg by mouth daily before breakfast.    Historical Provider, MD  naproxen (NAPROSYN) 500 MG tablet Take 500 mg by mouth daily.  05/14/11   Historical Provider, MD  NEXIUM 40 MG capsule Take 40 mg by mouth daily before breakfast.  06/30/11   Historical Provider, MD   BP 130/78 mmHg  Pulse 89  Temp(Src) 98 F (36.7 C) (Oral)  Resp 18  SpO2 97% Physical  Exam  Constitutional: He is oriented to person, place, and time. He appears well-developed and well-nourished. No distress.  Neck: Normal range of motion. Neck supple.  Pulmonary/Chest: Effort normal.  Genitourinary: Penis normal.  Prostate with minimal enlargement and some increasing firmness. Patient denies direct tenderness. Testicles descended, symmetric and oriented vertically. No evidence of fluid, cyst or other masses within the scrotum. Testicular palpation reveals tenderness to the bilateral posterior aspects. No upper pole epididymal tenderness. Negative for intra-inguinal hernia Reflex. No palpable inguinal lymphadenopathy. No swelling. No penile drainage. No external  lesions.  Musculoskeletal: He exhibits no edema.  Neurological: He is alert and oriented to person, place, and time.  Skin: Skin is warm and dry.  Psychiatric: He has a normal mood and affect.  Nursing note and vitals reviewed.   ED Course  Procedures (including critical care time) Labs Review Labs Reviewed  POCT URINALYSIS DIP (DEVICE) - Abnormal; Notable for the following:    Protein, ur 100 (*)    All other components within normal limits   Results for orders placed or performed during the hospital encounter of 04/26/14  POCT urinalysis dip (device)  Result Value Ref Range   Glucose, UA NEGATIVE NEGATIVE mg/dL   Bilirubin Urine NEGATIVE NEGATIVE   Ketones, ur NEGATIVE NEGATIVE mg/dL   Specific Gravity, Urine >=1.030 1.005 - 1.030   Hgb urine dipstick NEGATIVE NEGATIVE   pH 5.5 5.0 - 8.0   Protein, ur 100 (A) NEGATIVE mg/dL   Urobilinogen, UA 0.2 0.0 - 1.0 mg/dL   Nitrite NEGATIVE NEGATIVE   Leukocytes, UA NEGATIVE NEGATIVE    Imaging Review No results found.   MDM   1. Epididymitis, bilateral    Doxy 100 bid Warm compresses/bathes Ibuprofen F/u with your PCP or Dr. Kellie Simmering. Return if needed or worse.     Janne Napoleon, NP 04/26/14 1230

## 2014-04-26 NOTE — ED Notes (Signed)
Pt states that he has been working out and he has felt a strain in his groin area.

## 2014-05-24 ENCOUNTER — Other Ambulatory Visit: Payer: Self-pay | Admitting: Physical Medicine and Rehabilitation

## 2014-05-24 DIAGNOSIS — M545 Low back pain: Principal | ICD-10-CM

## 2014-05-24 DIAGNOSIS — G8929 Other chronic pain: Secondary | ICD-10-CM

## 2014-06-01 ENCOUNTER — Ambulatory Visit
Admission: RE | Admit: 2014-06-01 | Discharge: 2014-06-01 | Disposition: A | Discharge status: Home / Self Care | Payer: Federal, State, Local not specified - PPO | Source: Ambulatory Visit | Attending: Physical Medicine and Rehabilitation | Admitting: Physical Medicine and Rehabilitation

## 2014-06-01 DIAGNOSIS — G8929 Other chronic pain: Secondary | ICD-10-CM

## 2014-06-01 DIAGNOSIS — M545 Low back pain, unspecified: Secondary | ICD-10-CM

## 2014-09-13 DIAGNOSIS — H6062 Unspecified chronic otitis externa, left ear: Secondary | ICD-10-CM | POA: Diagnosis not present

## 2015-01-25 ENCOUNTER — Encounter: Payer: Self-pay | Admitting: Gastroenterology

## 2015-01-28 DIAGNOSIS — M25562 Pain in left knee: Secondary | ICD-10-CM | POA: Diagnosis not present

## 2015-01-28 DIAGNOSIS — R269 Unspecified abnormalities of gait and mobility: Secondary | ICD-10-CM | POA: Diagnosis not present

## 2015-01-28 DIAGNOSIS — M25561 Pain in right knee: Secondary | ICD-10-CM | POA: Diagnosis not present

## 2015-01-28 DIAGNOSIS — M170 Bilateral primary osteoarthritis of knee: Secondary | ICD-10-CM | POA: Diagnosis not present

## 2015-02-02 DIAGNOSIS — M25561 Pain in right knee: Secondary | ICD-10-CM | POA: Diagnosis not present

## 2015-02-02 DIAGNOSIS — M1711 Unilateral primary osteoarthritis, right knee: Secondary | ICD-10-CM | POA: Diagnosis not present

## 2015-02-04 DIAGNOSIS — M1712 Unilateral primary osteoarthritis, left knee: Secondary | ICD-10-CM | POA: Diagnosis not present

## 2015-02-04 DIAGNOSIS — M25562 Pain in left knee: Secondary | ICD-10-CM | POA: Diagnosis not present

## 2015-02-09 DIAGNOSIS — M25561 Pain in right knee: Secondary | ICD-10-CM | POA: Diagnosis not present

## 2015-02-09 DIAGNOSIS — M1711 Unilateral primary osteoarthritis, right knee: Secondary | ICD-10-CM | POA: Diagnosis not present

## 2015-02-11 DIAGNOSIS — M1712 Unilateral primary osteoarthritis, left knee: Secondary | ICD-10-CM | POA: Diagnosis not present

## 2015-02-11 DIAGNOSIS — M25562 Pain in left knee: Secondary | ICD-10-CM | POA: Diagnosis not present

## 2015-02-16 DIAGNOSIS — M25561 Pain in right knee: Secondary | ICD-10-CM | POA: Diagnosis not present

## 2015-02-16 DIAGNOSIS — M1711 Unilateral primary osteoarthritis, right knee: Secondary | ICD-10-CM | POA: Diagnosis not present

## 2015-02-18 DIAGNOSIS — M1712 Unilateral primary osteoarthritis, left knee: Secondary | ICD-10-CM | POA: Diagnosis not present

## 2015-02-18 DIAGNOSIS — M25562 Pain in left knee: Secondary | ICD-10-CM | POA: Diagnosis not present

## 2015-02-21 ENCOUNTER — Encounter (HOSPITAL_COMMUNITY): Payer: Self-pay | Admitting: *Deleted

## 2015-02-21 ENCOUNTER — Emergency Department (HOSPITAL_COMMUNITY): Payer: Medicare Other

## 2015-02-21 ENCOUNTER — Emergency Department (INDEPENDENT_AMBULATORY_CARE_PROVIDER_SITE_OTHER)
Admission: EM | Admit: 2015-02-21 | Discharge: 2015-02-21 | Disposition: A | Discharge status: Other Acute Inpatient Hospital | Payer: Medicare Other | Source: Home / Self Care | Attending: Family Medicine | Admitting: Family Medicine

## 2015-02-21 ENCOUNTER — Emergency Department (HOSPITAL_COMMUNITY)
Admission: EM | Admit: 2015-02-21 | Discharge: 2015-02-21 | Disposition: A | Discharge status: Home / Self Care | Payer: Medicare Other | Attending: Emergency Medicine | Admitting: Emergency Medicine

## 2015-02-21 DIAGNOSIS — Z79899 Other long term (current) drug therapy: Secondary | ICD-10-CM | POA: Diagnosis not present

## 2015-02-21 DIAGNOSIS — Z7982 Long term (current) use of aspirin: Secondary | ICD-10-CM | POA: Diagnosis not present

## 2015-02-21 DIAGNOSIS — R0789 Other chest pain: Secondary | ICD-10-CM

## 2015-02-21 DIAGNOSIS — Z880 Allergy status to penicillin: Secondary | ICD-10-CM | POA: Diagnosis not present

## 2015-02-21 DIAGNOSIS — Z9981 Dependence on supplemental oxygen: Secondary | ICD-10-CM | POA: Diagnosis not present

## 2015-02-21 DIAGNOSIS — Z720 Tobacco use: Secondary | ICD-10-CM | POA: Diagnosis not present

## 2015-02-21 DIAGNOSIS — Z8719 Personal history of other diseases of the digestive system: Secondary | ICD-10-CM | POA: Insufficient documentation

## 2015-02-21 DIAGNOSIS — Z8639 Personal history of other endocrine, nutritional and metabolic disease: Secondary | ICD-10-CM | POA: Diagnosis not present

## 2015-02-21 DIAGNOSIS — G4730 Sleep apnea, unspecified: Secondary | ICD-10-CM | POA: Insufficient documentation

## 2015-02-21 DIAGNOSIS — M109 Gout, unspecified: Secondary | ICD-10-CM | POA: Insufficient documentation

## 2015-02-21 DIAGNOSIS — R079 Chest pain, unspecified: Secondary | ICD-10-CM | POA: Diagnosis present

## 2015-02-21 LAB — CBC WITH DIFFERENTIAL/PLATELET
BASOS ABS: 0 10*3/uL (ref 0.0–0.1)
BASOS PCT: 0 %
Eosinophils Absolute: 0.3 10*3/uL (ref 0.0–0.7)
Eosinophils Relative: 4 %
HEMATOCRIT: 42.6 % (ref 39.0–52.0)
HEMOGLOBIN: 14.5 g/dL (ref 13.0–17.0)
Lymphocytes Relative: 31 %
Lymphs Abs: 2.4 10*3/uL (ref 0.7–4.0)
MCH: 31.7 pg (ref 26.0–34.0)
MCHC: 34 g/dL (ref 30.0–36.0)
MCV: 93 fL (ref 78.0–100.0)
Monocytes Absolute: 0.5 10*3/uL (ref 0.1–1.0)
Monocytes Relative: 7 %
NEUTROS ABS: 4.5 10*3/uL (ref 1.7–7.7)
NEUTROS PCT: 58 %
Platelets: 152 10*3/uL (ref 150–400)
RBC: 4.58 MIL/uL (ref 4.22–5.81)
RDW: 13.2 % (ref 11.5–15.5)
WBC: 7.8 10*3/uL (ref 4.0–10.5)

## 2015-02-21 LAB — TROPONIN I

## 2015-02-21 LAB — LIPASE, BLOOD: LIPASE: 35 U/L (ref 22–51)

## 2015-02-21 LAB — BRAIN NATRIURETIC PEPTIDE: B NATRIURETIC PEPTIDE 5: 18.2 pg/mL (ref 0.0–100.0)

## 2015-02-21 MED ORDER — GI COCKTAIL ~~LOC~~
30.0000 mL | Freq: Once | ORAL | Status: AC
Start: 2015-02-21 — End: 2015-02-21
  Administered 2015-02-21: 30 mL via ORAL
  Filled 2015-02-21: qty 30

## 2015-02-21 MED ORDER — SUCRALFATE 1 GM/10ML PO SUSP: 1.0000 g | Freq: Three times a day (TID) | ORAL | Status: DC

## 2015-02-21 NOTE — ED Notes (Signed)
Pt  Reports   Symptoms  Of  Chest  Tightness     And  Pain  Around  To  Back   With  Symptoms    X  4   Days   Pt  Has  A  History  Of  gerd      And  Has  Taken  nexium in past   For  The  Symptoms    Pt    Reports   Symptoms  Not        releived  By  pepsid    gax  s  X

## 2015-02-21 NOTE — Discharge Instructions (Signed)
As discussed, today's evaluation is largely reassuring. Your pain is likely coming to you from irritation of your stomach and esophagus. It is important to take all medication as directed, and be sure to follow-up with both your primary care physician and our gastroenterology colleagues.  Return here for concerning changes in your condition.

## 2015-02-21 NOTE — ED Provider Notes (Signed)
CSN: [id number]     Arrival date & time 02/21/15  1358 History   First MD Initiated Contact with Patient 02/21/15 1522     Chief Complaint  Patient presents with  . Chest Pain   (Consider location/radiation/quality/duration/timing/severity/associated sxs/prior Treatment) Patient is a 59 y.o. male presenting with chest pain. The history is provided by the patient.  Chest Pain Pain location:  Substernal area Pain quality: pressure   Pain quality comment:  Heaviness. Pain radiates to:  Does not radiate Pain radiates to the back: no   Pain severity:  Mild Onset quality:  Gradual Duration:  5 days Progression:  Unchanged Chronicity:  New Context comment:  Has had recent ent eval by dr Ernesto Rutherford., does swim regularly without sx. Relieved by:  None tried Ineffective treatments:  Antacids Associated symptoms: dizziness   Associated symptoms: no anorexia, no cough, no fever, no palpitations, no shortness of breath, not vomiting and no weakness   Risk factors: smoking     Past Medical History  Diagnosis Date  . GERD (gastroesophageal reflux disease)   . Hyperlipidemia   . Gout   . Sleep apnea     cpap   Past Surgical History  Procedure Laterality Date  . Left hip replacement  2009  . Right hip replacement  2006  . Bilateral knee arthroscopy      right- 1997; left 2001  . Rotator cuff repair  2004    right  . Parathyroidectomy  2005  . Colonoscopy      screening, 2002  . Knee surgery Right 2014    torn R medial meniscus- arthroscopy done   Family History  Problem Relation Age of Onset  . Colon cancer Neg Hx   . Stomach cancer Neg Hx   . Stroke Mother   . Diabetes Father    Social History  Substance Use Topics  . Smoking status: Current Every Day Smoker -- 0.50 packs/day    Types: Cigarettes  . Smokeless tobacco: Never Used  . Alcohol Use: 8.4 oz/week    14 Cans of beer per week    Review of Systems  Constitutional: Negative.  Negative for fever.  Respiratory:  Negative for cough, shortness of breath and wheezing.   Cardiovascular: Positive for chest pain. Negative for palpitations and leg swelling.  Gastrointestinal: Negative.  Negative for vomiting and anorexia.  Genitourinary: Negative.   Neurological: Positive for dizziness. Negative for weakness.  All other systems reviewed and are negative.   Allergies  Iohexol and Penicillins  Home Medications   Prior to Admission medications   Medication Sig Start Date End Date Taking? Authorizing Provider  allopurinol (ZYLOPRIM) 300 MG tablet Take 300 mg by mouth daily.  06/30/11   Historical Provider, MD  aspirin 81 MG tablet Take 81 mg by mouth daily.    Historical Provider, MD  CRESTOR 20 MG tablet Take 20 mg by mouth daily.  06/30/11   Historical Provider, MD  doxycycline (VIBRAMYCIN) 100 MG capsule Take 1 capsule (100 mg total) by mouth 2 (two) times daily. 04/26/14   Janne Napoleon, NP  fluticasone (FLONASE) 50 MCG/ACT nasal spray Place 1 spray into the nose 2 (two) times daily.  06/30/11   Historical Provider, MD  hyoscyamine (LEVSIN SL) 0.125 MG SL tablet Take 0.125 mg by mouth every 6 (six) hours as needed.  07/10/11   Historical Provider, MD  levothyroxine (SYNTHROID, LEVOTHROID) 100 MCG tablet Take 100 mcg by mouth daily before breakfast.    Historical Provider, MD  naproxen (NAPROSYN) 500 MG tablet Take 500 mg by mouth daily.  05/14/11   Historical Provider, MD  NEXIUM 40 MG capsule Take 40 mg by mouth daily before breakfast.  06/30/11   Historical Provider, MD   Meds Ordered and Administered this Visit  Medications - No data to display  BP 135/83 mmHg  Pulse 83  Temp(Src) 98.4 F (36.9 C) (Oral)  Resp 16  SpO2 98% No data found.   Physical Exam  Constitutional: He is oriented to person, place, and time. He appears well-developed and well-nourished.  HENT:  Mouth/Throat: Oropharynx is clear and moist.  Eyes: Pupils are equal, round, and reactive to light.  Neck: Normal range of motion.  Neck supple.  Cardiovascular: Normal rate, regular rhythm, normal heart sounds and intact distal pulses.   Pulmonary/Chest: Effort normal and breath sounds normal. He has no rales. He exhibits no tenderness.  Abdominal: Soft. Bowel sounds are normal. There is no tenderness.  Lymphadenopathy:    He has no cervical adenopathy.  Neurological: He is alert and oriented to person, place, and time.  Skin: Skin is warm and dry.  Nursing note and vitals reviewed.   ED Course  Procedures (including critical care time)  Labs Review Labs Reviewed - No data to display ED ECG REPORT   Date: 02/21/2015  Rate: 79  Rhythm: sinus arrhythmia  QRS Axis: normal  Intervals: normal  ST/T Wave abnormalities: normal  Conduction Disutrbances:none  Narrative Interpretation: bigeminy  Old EKG Reviewed: none available  I have personally reviewed the EKG tracing and agree with the computerized printout as noted.     Imaging Review No results found.   Visual Acuity Review  Right Eye Distance:   Left Eye Distance:   Bilateral Distance:    Right Eye Near:   Left Eye Near:    Bilateral Near:         MDM   1. Atypical chest pain    Sent for cardiac eval of cp --heaviness, not responding to usual nexium,pepcid or gas-x, is a smoker.    Billy Fischer, MD 02/21/15 [phone]

## 2015-02-21 NOTE — ED Notes (Signed)
MD at bedside. 

## 2015-02-21 NOTE — ED Notes (Signed)
Pt arrives via [facility] from [facility] c/o CP x 5 days with no radiation. No history of the same.

## 2015-02-21 NOTE — ED Provider Notes (Signed)
CSN: [id number]     Arrival date & time 02/21/15  1650 History   First MD Initiated Contact with Patient 02/21/15 1651     Chief Complaint  Patient presents with  . Chest Pain     (Consider location/radiation/quality/duration/timing/severity/associated sxs/prior Treatment) HPI Patient presents as a transfer from urgent care. Patient complains of ongoing sternal chest discomfort, described as pressure. Onset was about 4 days ago, since onset has been persistent, with no radiation. There is mild associated dyspnea, though the patient has continued to have daily swimming sessions, without increased chest pain, or dyspnea. He does describe some grease to dyspnea with walking down stairs. No new weight loss, weight gain, fever, chills, cough. Patient has a history of GERD, has been chronically taking PPI. He notes one prior similar episode, though that resolved with resumption of PPI. This episode, no clear alleviating, exacerbating factors beyond walking up stairs.  Past Medical History  Diagnosis Date  . GERD (gastroesophageal reflux disease)   . Hyperlipidemia   . Gout   . Sleep apnea     cpap   Past Surgical History  Procedure Laterality Date  . Left hip replacement  2009  . Right hip replacement  2006  . Bilateral knee arthroscopy      right- 1997; left 2001  . Rotator cuff repair  2004    right  . Parathyroidectomy  2005  . Colonoscopy      screening, 2002  . Knee surgery Right 2014    torn R medial meniscus- arthroscopy done   Family History  Problem Relation Age of Onset  . Colon cancer Neg Hx   . Stomach cancer Neg Hx   . Stroke Mother   . Diabetes Father    Social History  Substance Use Topics  . Smoking status: Current Every Day Smoker -- 0.50 packs/day    Types: Cigarettes  . Smokeless tobacco: Never Used  . Alcohol Use: 8.4 oz/week    14 Cans of beer per week    Review of Systems  Constitutional:       Per HPI, otherwise negative  HENT:   Per HPI, otherwise negative  Respiratory:       Per HPI, otherwise negative  Cardiovascular:       Per HPI, otherwise negative  Gastrointestinal: Negative for vomiting.  Endocrine:       Negative aside from HPI  Genitourinary:       Neg aside from HPI   Musculoskeletal:       Per HPI, otherwise negative  Skin: Negative.   Neurological: Negative for syncope.      Allergies  Iohexol; Penicillins; and Shellfish allergy  Home Medications   Prior to Admission medications   Medication Sig Start Date End Date Taking? Authorizing Provider  allopurinol (ZYLOPRIM) 300 MG tablet Take 300 mg by mouth daily.  06/30/11   Historical Provider, MD  aspirin 81 MG tablet Take 81 mg by mouth daily.    Historical Provider, MD  CRESTOR 20 MG tablet Take 20 mg by mouth daily.  06/30/11   Historical Provider, MD  doxycycline (VIBRAMYCIN) 100 MG capsule Take 1 capsule (100 mg total) by mouth 2 (two) times daily. 04/26/14   Janne Napoleon, NP  fluticasone (FLONASE) 50 MCG/ACT nasal spray Place 1 spray into the nose 2 (two) times daily.  06/30/11   Historical Provider, MD  hyoscyamine (LEVSIN SL) 0.125 MG SL tablet Take 0.125 mg by mouth every 6 (six) hours as needed.  07/10/11  Historical Provider, MD  levothyroxine (SYNTHROID, LEVOTHROID) 100 MCG tablet Take 100 mcg by mouth daily before breakfast.    Historical Provider, MD  naproxen (NAPROSYN) 500 MG tablet Take 500 mg by mouth daily.  05/14/11   Historical Provider, MD  NEXIUM 40 MG capsule Take 40 mg by mouth daily before breakfast.  06/30/11   Historical Provider, MD   BP 134/90 mmHg  Pulse 84  Temp(Src) 97.9 F (36.6 C) (Oral)  Resp 17  Ht 5' 9.5" (1.765 m)  SpO2 98% Physical Exam  Constitutional: He is oriented to person, place, and time. He appears well-developed. No distress.  HENT:  Head: Normocephalic and atraumatic.  Eyes: Conjunctivae and EOM are normal.  Cardiovascular: Normal rate and regular rhythm.   Pulmonary/Chest: Effort normal. No  stridor. No respiratory distress.  Abdominal: He exhibits no distension.  Musculoskeletal: He exhibits no edema.  Neurological: He is alert and oriented to person, place, and time.  Skin: Skin is warm and dry.  Psychiatric: He has a normal mood and affect.  Nursing note and vitals reviewed.  I discussed patient's initial presentation, care during transfer with our EMS colleagues. Patient was hemodynamically stable en route, with no new complaints.  ED Course  Procedures (including critical care time) Labs Review Labs Reviewed  LIPASE, BLOOD  BRAIN NATRIURETIC PEPTIDE  CBC WITH DIFFERENTIAL/PLATELET  TROPONIN I    Imaging Review Dg Chest 2 View  02/21/2015   CLINICAL DATA:  Chest discomfort for 4 days.  Congestion.  EXAM: CHEST  2 VIEW  COMPARISON:  12/04/2007  FINDINGS: Moderate thoracic spondylosis. Lateral view degraded by patient arm position. Mild right hemidiaphragm elevation and eventration anteriorly. Midline trachea. Mild cardiomegaly with a tortuous thoracic aorta. No pleural effusion or pneumothorax. Mild right base volume loss. No congestive failure.  IMPRESSION: No acute cardiopulmonary disease.  Cardiomegaly without congestive failure.  Right hemidiaphragm elevation and eventration.   Electronically Signed   By: Abigail Miyamoto M.D.   On: 02/21/2015 18:26   I have personally reviewed and evaluated these images and lab results as part of my medical decision-making.   EKG Interpretation   Date/Time:  Monday February 21 2015 16:59:13 EDT Ventricular Rate:  83 PR Interval:  154 QRS Duration: 76 QT Interval:  378 QTC Calculation: 444 R Axis:   55 Text Interpretation:  Sinus rhythm Atrial premature complexes Low voltage,  precordial leads Baseline wander in lead(s) V6 Sinus rhythm Artifact No  significant change since last tracing Abnormal ekg Confirmed by Carmin Muskrat  MD [phone]) on 02/21/2015 5:24:11 PM     I read EMR from [facility] for [facility]  8:02 PM Patient states  that he feels substantially better, no ongoing complaints.  MDM  History presents with ongoing chest and epigastric pain. Patient is awake, alert, in no distress. Patient's evaluation here, including troponin, EKG reassuring, with low suspicion for ongoing coronary ischemia. In addition, the patient's given history of continued exertion without discomfort or disability suggest no angina. No hypoxia / tachypnea / tachycardia c/w PE. Patient has a history of gastric irritation, started on a new course of topical medication, will continue his PPI, follow up with gastroenterology.   Carmin Muskrat, MD 02/21/15 2004

## 2015-02-21 NOTE — ED Notes (Signed)
Patient transported to X-ray 

## 2015-02-21 NOTE — ED Notes (Signed)
Iv  Ns  tko   20  Angio    r  Hand    1 att       Site patent        Cardiac  Monitor     Nasal  o2  2  l  /  Min

## 2015-02-23 DIAGNOSIS — M1711 Unilateral primary osteoarthritis, right knee: Secondary | ICD-10-CM | POA: Diagnosis not present

## 2015-02-23 DIAGNOSIS — M25561 Pain in right knee: Secondary | ICD-10-CM | POA: Diagnosis not present

## 2015-02-25 DIAGNOSIS — M1712 Unilateral primary osteoarthritis, left knee: Secondary | ICD-10-CM | POA: Diagnosis not present

## 2015-02-25 DIAGNOSIS — M25562 Pain in left knee: Secondary | ICD-10-CM | POA: Diagnosis not present

## 2015-02-28 ENCOUNTER — Telehealth: Payer: Self-pay | Admitting: Gastroenterology

## 2015-02-28 NOTE — Telephone Encounter (Signed)
Spoke with patient and told him we have no appointments for this week. He states he will call his PCP and see what they can do. Declined to schedule in December.

## 2015-03-15 ENCOUNTER — Ambulatory Visit (INDEPENDENT_AMBULATORY_CARE_PROVIDER_SITE_OTHER): Payer: Medicare Other | Admitting: Physician Assistant

## 2015-03-15 ENCOUNTER — Encounter: Payer: Self-pay | Admitting: Physician Assistant

## 2015-03-15 VITALS — BP 112/80 | HR 88 | Ht 68.5 in | Wt 248.0 lb

## 2015-03-15 DIAGNOSIS — K219 Gastro-esophageal reflux disease without esophagitis: Secondary | ICD-10-CM | POA: Diagnosis not present

## 2015-03-15 MED ORDER — ESOMEPRAZOLE MAGNESIUM 40 MG PO CPDR: 40.0000 mg | DELAYED_RELEASE_CAPSULE | Freq: Every day | ORAL | Status: DC

## 2015-03-15 NOTE — Progress Notes (Signed)
Patient ID: Kyle Delacruz, male   DOB: 1955/08/25, 59 y.o.   MRN: [id number]   Subjective:    Patient ID: Kyle Delacruz, male    DOB: 1956/04/07, 59 y.o.   MRN: [id number]  HPI  Harinder is a pleasant 59 year old male referred by Dr. Dagmar Hait after recent ER visit in October with complaints of chest pain which were felt to be esophageal in origin. Patient known to Dr. Sharlett Iles in last seen here in 2013 for colonoscopy which was done for screening. He did have left-sided diverticulosis, no polyps were found. He has not had prior EGD. Patient states that at the time of his ER visit he had been having a burning type chest pain ongoing for about a month. He says his will ease up at times but keeps coming back. Pain is located in his mid central chest without radiation to his back neck or arms. He does not have any exertional symptoms and is able to exercise without any change in his discomfort. He swims on a regular basis. Is not had any associated nausea or vomiting no dysphagia. He has noted an increased need for belching. He denies any abdominal pain. After ER visit he was given a course of Carafate though he is not sure this helped. He did have an episode last evening which seemed to be worse than usual after eating some "pepper [place]". He had previously been on Nexium for 6 or 7 years and more recently on OTC Nexium as his insurance would not cover and therefore was taking 20 mg per day. Over the past week he has been on proton X I believe 40 mg and thus far has not had any significant change in his symptoms. He does not take any regular aspirin or NSAIDs, he does drink some alcohol daily and is  a smoker.  Review of Systems Pertinent positive and negative review of systems were noted in the above HPI section.  All other review of systems was otherwise negative.  Outpatient Encounter Prescriptions as of 03/15/2015  Medication Sig  . allopurinol (ZYLOPRIM) 300 MG tablet Take 300 mg by mouth daily.   Marland Kitchen  aspirin 81 MG tablet Take 81 mg by mouth daily.  . CRESTOR 20 MG tablet Take 20 mg by mouth daily.   . fluticasone (FLONASE) 50 MCG/ACT nasal spray Place 1 spray into the nose 2 (two) times daily.   . hyoscyamine (LEVSIN SL) 0.125 MG SL tablet Take 0.125 mg by mouth every 6 (six) hours as needed.   Marland Kitchen levothyroxine (SYNTHROID, LEVOTHROID) 50 MCG tablet Take 1 tablet by mouth daily.  . pantoprazole (PROTONIX) 40 MG tablet Take 1 tablet by mouth daily.  Marland Kitchen esomeprazole (NEXIUM) 40 MG capsule Take 1 capsule (40 mg total) by mouth daily at 12 noon.  . [DISCONTINUED] levothyroxine (SYNTHROID, LEVOTHROID) 100 MCG tablet Take 100 mcg by mouth daily before breakfast.  . [DISCONTINUED] NEXIUM 40 MG capsule Take 40 mg by mouth daily before breakfast.   . [DISCONTINUED] sucralfate (CARAFATE) 1 GM/10ML suspension Take 10 mLs (1 g total) by mouth 4 (four) times daily -  with meals and at bedtime.   No facility-administered encounter medications on file as of 03/15/2015.   Allergies  Allergen Reactions  . Iohexol      Code: RASH, Desc: rash 2 years ago, needs 13 hr prep for future exams   . Penicillins Hives  . Shellfish Allergy Swelling   Patient Active Problem List   Diagnosis Date Noted  .  Special screening for malignant neoplasms, colon 07/30/2011  . Diverticulosis of colon (without mention of hemorrhage) 07/30/2011   Social History   Social History  . Marital Status: Single    Spouse Name: N/A  . Number of Children: 0  . Years of Education: N/A   Occupational History  . retired Therapist, sports    Social History Main Topics  . Smoking status: Current Every Day Smoker -- 0.50 packs/day    Types: Cigarettes  . Smokeless tobacco: Never Used  . Alcohol Use: 8.4 oz/week    14 Cans of beer, 0 Standard drinks or equivalent per week     Comment: 14 cans a week  . Drug Use: No  . Sexual Activity: Not on file   Other Topics Concern  . Not on file   Social History Narrative    [name]  family history includes Diabetes in his father and paternal grandmother; Heart disease in his father; Stroke in his mother. There is no history of Colon cancer or Stomach cancer.      Objective:    Filed Vitals:   03/15/15 0958  BP: 112/80  Pulse: 88    Physical Exam  well-developed older African-American male in no acute distress, blood pressure 112/80 pulse 88 height 5 foot 8 weight 248, BMI 37.1. HEENT; nontraumatic normocephalic EOMI PERRLA sclera anicteric, Supple ;no JVD, Cardiovascular; regular rate and rhythm with S1-S2 no murmur rub or gallop, Pulmonary; clear bilaterally, Abdomen ;soft nontender nondistended bowel sounds are active there is no palpable mass or hepatosplenomegaly bowel sounds are present, Rectal ;exam not done, Ext; no clubbing cyanosis or edema skin warm dry, Neuropsych ;mood and affect appropriate       Assessment & Plan:   #1 59 yo male with hx of chronic GERD with one month of burning chest pain, refractory to OTC Nexium- suspect GERD with esophagitis.  R/O Barretts , lesion- no prior EGD  #2 Diverticulosis- up to date with colon screening last done 2013   Plan; Antireflux regimen discussed and reinforced Schedule for EGD with Dr. Carlean Purl- procedure discussed in detail and he is agreeable to proceed. Start trial of Nexium 40 mg po qam -rx sent     Amy S Esterwood PA-C 03/15/2015   Cc: Kyle Solian, MD

## 2015-03-15 NOTE — Patient Instructions (Addendum)
You have been given a separate informational sheet regarding your tobacco use, the importance of quitting and local resources to help you quit. We sent a prescription for Nexium 40 mg to [facility] [address].  You have been scheduled for an endoscopy. Please follow written instructions given to you at your visit today. If you use inhalers (even only as needed), please bring them with you on the day of your procedure.

## 2015-03-17 ENCOUNTER — Ambulatory Visit (AMBULATORY_SURGERY_CENTER): Payer: Medicare Other | Admitting: Internal Medicine

## 2015-03-17 ENCOUNTER — Encounter: Payer: Self-pay | Admitting: Internal Medicine

## 2015-03-17 VITALS — BP 133/93 | HR 80 | Temp 98.0°F | Resp 31 | Ht 68.0 in | Wt 248.0 lb

## 2015-03-17 DIAGNOSIS — K219 Gastro-esophageal reflux disease without esophagitis: Secondary | ICD-10-CM

## 2015-03-17 DIAGNOSIS — G4733 Obstructive sleep apnea (adult) (pediatric): Secondary | ICD-10-CM | POA: Diagnosis not present

## 2015-03-17 MED ORDER — SODIUM CHLORIDE 0.9 % IV SOLN: 500.0000 mL | INTRAVENOUS | Status: DC

## 2015-03-17 NOTE — Patient Instructions (Addendum)
This looks normal but we still think acid reflux is causing the symptoms - I hope the Nexium makes the difference. If it does not make you better in 1-2 months let us know.  I appreciate the opportunity to care for you. Gatha Mayer, MD, FACG YOU HAD AN ENDOSCOPIC PROCEDURE TODAY AT [facility]:   Refer to the procedure report that was given to you for any specific questions about what was found during the examination.  If the procedure report does not answer your questions, please call your gastroenterologist to clarify.  If you requested that your care partner not be given the details of your procedure findings, then the procedure report has been included in a sealed envelope for you to review at your convenience later.  YOU SHOULD EXPECT: Some feelings of bloating in the abdomen. Passage of more gas than usual.  Walking can help get rid of the air that was put into your GI tract during the procedure and reduce the bloating. If you had a lower endoscopy (such as a colonoscopy or flexible sigmoidoscopy) you may notice spotting of blood in your stool or on the toilet paper. If you underwent a bowel prep for your procedure, you may not have a normal bowel movement for a few days.  Please Note:  You might notice some irritation and congestion in your nose or some drainage.  This is from the oxygen used during your procedure.  There is no need for concern and it should clear up in a day or so.  SYMPTOMS TO REPORT IMMEDIATELY:    Following upper endoscopy (EGD)  Vomiting of blood or coffee ground material  New chest pain or pain under the shoulder blades  Painful or persistently difficult swallowing  New shortness of breath  Fever of 100F or higher  Black, tarry-looking stools  For urgent or emergent issues, a gastroenterologist can be reached at any hour by calling [phone].   DIET: Your first meal following the procedure should be a small meal and then it is  ok to progress to your normal diet. Heavy or fried foods are harder to digest and may make you feel nauseous or bloated.  Likewise, meals heavy in dairy and vegetables can increase bloating.  Drink plenty of fluids but you should avoid alcoholic beverages for 24 hours.  ACTIVITY:  You should plan to take it easy for the rest of today and you should NOT DRIVE or use heavy machinery until tomorrow (because of the sedation medicines used during the test).    FOLLOW UP: Our staff will call the number listed on your records the next business day following your procedure to check on you and address any questions or concerns that you may have regarding the information given to you following your procedure. If we do not reach you, we will leave a message.  However, if you are feeling well and you are not experiencing any problems, there is no need to return our call.  We will assume that you have returned to your regular daily activities without incident.  If any biopsies were taken you will be contacted by phone or by letter within the next 1-3 weeks.  Please call us at [phone] if you have not heard about the biopsies in 3 weeks.    SIGNATURES/CONFIDENTIALITY: You and/or your care partner have signed paperwork which will be entered into your electronic medical record.  These signatures attest to the fact that that the  information above on your After Visit Summary has been reviewed and is understood.  Full responsibility of the confidentiality of this discharge information lies with you and/or your care-partner

## 2015-03-17 NOTE — Op Note (Signed)
[place]  [address] [place], [id number]   ENDOSCOPY PROCEDURE REPORT  PATIENT: Quince, Santana  MR#: [id number] BIRTHDATE: 09-03-1955 , 41  yrs. old GENDER: male ENDOSCOPIST: Gatha Mayer, MD, [facility] PROCEDURE DATE:  03/17/2015 PROCEDURE:  EGD, diagnostic ASA CLASS:     Class II INDICATIONS:  heartburn. MEDICATIONS: Propofol 200 mg IV and Monitored anesthesia care TOPICAL ANESTHETIC: none  DESCRIPTION OF PROCEDURE: After the risks benefits and alternatives of the procedure were thoroughly explained, informed consent was obtained.  The LB [id number] endoscope was introduced through the mouth and advanced to the second portion of the duodenum , Without limitations.  The instrument was slowly withdrawn as the mucosa was fully examined.      EXAM: The esophagus and gastroesophageal junction were completely normal in appearance.  The stomach was entered and closely examined.The antrum, angularis, and lesser curvature were well visualized, including a retroflexed view of the cardia and fundus. The stomach wall was normally distensable.  The scope passed easily through the pylorus into the duodenum.  Retroflexed views revealed no abnormalities.     The scope was then withdrawn from the patient and the procedure completed.  COMPLICATIONS: There were no immediate complications.  ENDOSCOPIC IMPRESSION: Normal appearing esophagus and GE junction, the stomach was well visualized and normal in appearance, normal appearing duodenum  RECOMMENDATIONS: Let's see if returning to Nexium 40 mg daily relieves symptoms he just filled Rx- if not after 1-2 months call back   eSigned:  Gatha Mayer, MD, [facility] 03/17/2015 4:10 PM    CC:the Patient

## 2015-03-17 NOTE — Progress Notes (Signed)
A/ox3 pleased with MAC, report to Jane RN 

## 2015-03-18 ENCOUNTER — Telehealth: Payer: Self-pay | Admitting: *Deleted

## 2015-03-18 NOTE — Telephone Encounter (Signed)
No answer, left message to call if questions or concerns. 

## 2015-03-23 NOTE — Progress Notes (Signed)
Agree with Ms. Esterwood's assessment and plan. Brendy Ficek E. Ilona Colley, MD, FACG   

## 2015-03-24 DIAGNOSIS — R2689 Other abnormalities of gait and mobility: Secondary | ICD-10-CM | POA: Diagnosis not present

## 2015-03-24 DIAGNOSIS — M25561 Pain in right knee: Secondary | ICD-10-CM | POA: Diagnosis not present

## 2015-03-24 DIAGNOSIS — M1711 Unilateral primary osteoarthritis, right knee: Secondary | ICD-10-CM | POA: Diagnosis not present

## 2015-06-01 DIAGNOSIS — M170 Bilateral primary osteoarthritis of knee: Secondary | ICD-10-CM | POA: Diagnosis not present

## 2015-06-02 DIAGNOSIS — M5416 Radiculopathy, lumbar region: Secondary | ICD-10-CM | POA: Diagnosis not present

## 2015-06-07 DIAGNOSIS — I10 Essential (primary) hypertension: Secondary | ICD-10-CM | POA: Diagnosis not present

## 2015-06-07 DIAGNOSIS — M545 Low back pain: Secondary | ICD-10-CM | POA: Diagnosis not present

## 2015-06-07 DIAGNOSIS — R102 Pelvic and perineal pain: Secondary | ICD-10-CM | POA: Diagnosis not present

## 2015-06-16 DIAGNOSIS — Z6835 Body mass index (BMI) 35.0-35.9, adult: Secondary | ICD-10-CM | POA: Diagnosis not present

## 2015-06-16 DIAGNOSIS — R102 Pelvic and perineal pain: Secondary | ICD-10-CM | POA: Diagnosis not present

## 2015-06-21 DIAGNOSIS — M47816 Spondylosis without myelopathy or radiculopathy, lumbar region: Secondary | ICD-10-CM | POA: Diagnosis not present

## 2015-06-28 DIAGNOSIS — R1030 Lower abdominal pain, unspecified: Secondary | ICD-10-CM | POA: Diagnosis not present

## 2015-06-28 DIAGNOSIS — Z96643 Presence of artificial hip joint, bilateral: Secondary | ICD-10-CM | POA: Diagnosis not present

## 2015-07-26 ENCOUNTER — Other Ambulatory Visit (HOSPITAL_COMMUNITY): Payer: Self-pay | Admitting: Orthopedic Surgery

## 2015-07-26 DIAGNOSIS — M25559 Pain in unspecified hip: Secondary | ICD-10-CM

## 2015-08-01 ENCOUNTER — Other Ambulatory Visit (HOSPITAL_COMMUNITY): Payer: Self-pay | Admitting: Orthopedic Surgery

## 2015-08-01 DIAGNOSIS — M25551 Pain in right hip: Secondary | ICD-10-CM

## 2015-08-01 DIAGNOSIS — M25552 Pain in left hip: Principal | ICD-10-CM

## 2015-08-19 ENCOUNTER — Other Ambulatory Visit: Payer: Self-pay | Admitting: Orthopedic Surgery

## 2015-08-22 ENCOUNTER — Other Ambulatory Visit: Payer: Self-pay | Admitting: Orthopedic Surgery

## 2015-08-22 DIAGNOSIS — M25552 Pain in left hip: Secondary | ICD-10-CM

## 2015-08-22 DIAGNOSIS — M25551 Pain in right hip: Secondary | ICD-10-CM

## 2015-08-26 ENCOUNTER — Ambulatory Visit
Admission: RE | Admit: 2015-08-26 | Discharge: 2015-08-26 | Disposition: A | Discharge status: Home / Self Care | Payer: Medicare Other | Source: Ambulatory Visit | Attending: Orthopedic Surgery | Admitting: Orthopedic Surgery

## 2015-08-26 DIAGNOSIS — M25552 Pain in left hip: Secondary | ICD-10-CM

## 2015-08-26 DIAGNOSIS — M25551 Pain in right hip: Secondary | ICD-10-CM | POA: Diagnosis not present

## 2015-09-01 DIAGNOSIS — M9902 Segmental and somatic dysfunction of thoracic region: Secondary | ICD-10-CM | POA: Diagnosis not present

## 2015-09-01 DIAGNOSIS — M5431 Sciatica, right side: Secondary | ICD-10-CM | POA: Diagnosis not present

## 2015-09-01 DIAGNOSIS — M9903 Segmental and somatic dysfunction of lumbar region: Secondary | ICD-10-CM | POA: Diagnosis not present

## 2015-09-01 DIAGNOSIS — M5135 Other intervertebral disc degeneration, thoracolumbar region: Secondary | ICD-10-CM | POA: Diagnosis not present

## 2015-09-01 DIAGNOSIS — M5137 Other intervertebral disc degeneration, lumbosacral region: Secondary | ICD-10-CM | POA: Diagnosis not present

## 2015-09-01 DIAGNOSIS — M9905 Segmental and somatic dysfunction of pelvic region: Secondary | ICD-10-CM | POA: Diagnosis not present

## 2015-09-06 DIAGNOSIS — M5137 Other intervertebral disc degeneration, lumbosacral region: Secondary | ICD-10-CM | POA: Diagnosis not present

## 2015-09-06 DIAGNOSIS — M5431 Sciatica, right side: Secondary | ICD-10-CM | POA: Diagnosis not present

## 2015-09-06 DIAGNOSIS — M9905 Segmental and somatic dysfunction of pelvic region: Secondary | ICD-10-CM | POA: Diagnosis not present

## 2015-09-06 DIAGNOSIS — M9903 Segmental and somatic dysfunction of lumbar region: Secondary | ICD-10-CM | POA: Diagnosis not present

## 2015-09-06 DIAGNOSIS — M5135 Other intervertebral disc degeneration, thoracolumbar region: Secondary | ICD-10-CM | POA: Diagnosis not present

## 2015-09-06 DIAGNOSIS — M9902 Segmental and somatic dysfunction of thoracic region: Secondary | ICD-10-CM | POA: Diagnosis not present

## 2015-09-08 DIAGNOSIS — M5431 Sciatica, right side: Secondary | ICD-10-CM | POA: Diagnosis not present

## 2015-09-08 DIAGNOSIS — M9903 Segmental and somatic dysfunction of lumbar region: Secondary | ICD-10-CM | POA: Diagnosis not present

## 2015-09-08 DIAGNOSIS — M9905 Segmental and somatic dysfunction of pelvic region: Secondary | ICD-10-CM | POA: Diagnosis not present

## 2015-09-08 DIAGNOSIS — M9902 Segmental and somatic dysfunction of thoracic region: Secondary | ICD-10-CM | POA: Diagnosis not present

## 2015-09-08 DIAGNOSIS — M5135 Other intervertebral disc degeneration, thoracolumbar region: Secondary | ICD-10-CM | POA: Diagnosis not present

## 2015-09-08 DIAGNOSIS — M5137 Other intervertebral disc degeneration, lumbosacral region: Secondary | ICD-10-CM | POA: Diagnosis not present

## 2015-09-13 DIAGNOSIS — M5137 Other intervertebral disc degeneration, lumbosacral region: Secondary | ICD-10-CM | POA: Diagnosis not present

## 2015-09-13 DIAGNOSIS — M9902 Segmental and somatic dysfunction of thoracic region: Secondary | ICD-10-CM | POA: Diagnosis not present

## 2015-09-13 DIAGNOSIS — M5431 Sciatica, right side: Secondary | ICD-10-CM | POA: Diagnosis not present

## 2015-09-13 DIAGNOSIS — M9905 Segmental and somatic dysfunction of pelvic region: Secondary | ICD-10-CM | POA: Diagnosis not present

## 2015-09-13 DIAGNOSIS — M9903 Segmental and somatic dysfunction of lumbar region: Secondary | ICD-10-CM | POA: Diagnosis not present

## 2015-09-13 DIAGNOSIS — M5135 Other intervertebral disc degeneration, thoracolumbar region: Secondary | ICD-10-CM | POA: Diagnosis not present

## 2015-09-15 DIAGNOSIS — M5137 Other intervertebral disc degeneration, lumbosacral region: Secondary | ICD-10-CM | POA: Diagnosis not present

## 2015-09-15 DIAGNOSIS — M9903 Segmental and somatic dysfunction of lumbar region: Secondary | ICD-10-CM | POA: Diagnosis not present

## 2015-09-15 DIAGNOSIS — M5431 Sciatica, right side: Secondary | ICD-10-CM | POA: Diagnosis not present

## 2015-09-15 DIAGNOSIS — M9902 Segmental and somatic dysfunction of thoracic region: Secondary | ICD-10-CM | POA: Diagnosis not present

## 2015-09-15 DIAGNOSIS — M9905 Segmental and somatic dysfunction of pelvic region: Secondary | ICD-10-CM | POA: Diagnosis not present

## 2015-09-15 DIAGNOSIS — M5135 Other intervertebral disc degeneration, thoracolumbar region: Secondary | ICD-10-CM | POA: Diagnosis not present

## 2015-09-20 DIAGNOSIS — M9902 Segmental and somatic dysfunction of thoracic region: Secondary | ICD-10-CM | POA: Diagnosis not present

## 2015-09-20 DIAGNOSIS — M9903 Segmental and somatic dysfunction of lumbar region: Secondary | ICD-10-CM | POA: Diagnosis not present

## 2015-09-20 DIAGNOSIS — M5431 Sciatica, right side: Secondary | ICD-10-CM | POA: Diagnosis not present

## 2015-09-20 DIAGNOSIS — M5135 Other intervertebral disc degeneration, thoracolumbar region: Secondary | ICD-10-CM | POA: Diagnosis not present

## 2015-09-20 DIAGNOSIS — M5137 Other intervertebral disc degeneration, lumbosacral region: Secondary | ICD-10-CM | POA: Diagnosis not present

## 2015-09-20 DIAGNOSIS — M9905 Segmental and somatic dysfunction of pelvic region: Secondary | ICD-10-CM | POA: Diagnosis not present

## 2015-09-22 DIAGNOSIS — M9905 Segmental and somatic dysfunction of pelvic region: Secondary | ICD-10-CM | POA: Diagnosis not present

## 2015-09-22 DIAGNOSIS — M9903 Segmental and somatic dysfunction of lumbar region: Secondary | ICD-10-CM | POA: Diagnosis not present

## 2015-09-22 DIAGNOSIS — M9902 Segmental and somatic dysfunction of thoracic region: Secondary | ICD-10-CM | POA: Diagnosis not present

## 2015-09-22 DIAGNOSIS — M5137 Other intervertebral disc degeneration, lumbosacral region: Secondary | ICD-10-CM | POA: Diagnosis not present

## 2015-09-22 DIAGNOSIS — M5135 Other intervertebral disc degeneration, thoracolumbar region: Secondary | ICD-10-CM | POA: Diagnosis not present

## 2015-09-22 DIAGNOSIS — M5431 Sciatica, right side: Secondary | ICD-10-CM | POA: Diagnosis not present

## 2015-09-27 DIAGNOSIS — M9905 Segmental and somatic dysfunction of pelvic region: Secondary | ICD-10-CM | POA: Diagnosis not present

## 2015-09-27 DIAGNOSIS — M9903 Segmental and somatic dysfunction of lumbar region: Secondary | ICD-10-CM | POA: Diagnosis not present

## 2015-09-27 DIAGNOSIS — M5137 Other intervertebral disc degeneration, lumbosacral region: Secondary | ICD-10-CM | POA: Diagnosis not present

## 2015-09-27 DIAGNOSIS — M5431 Sciatica, right side: Secondary | ICD-10-CM | POA: Diagnosis not present

## 2015-09-27 DIAGNOSIS — M5135 Other intervertebral disc degeneration, thoracolumbar region: Secondary | ICD-10-CM | POA: Diagnosis not present

## 2015-09-27 DIAGNOSIS — M9902 Segmental and somatic dysfunction of thoracic region: Secondary | ICD-10-CM | POA: Diagnosis not present

## 2015-09-29 DIAGNOSIS — M9902 Segmental and somatic dysfunction of thoracic region: Secondary | ICD-10-CM | POA: Diagnosis not present

## 2015-09-29 DIAGNOSIS — M5137 Other intervertebral disc degeneration, lumbosacral region: Secondary | ICD-10-CM | POA: Diagnosis not present

## 2015-09-29 DIAGNOSIS — M9903 Segmental and somatic dysfunction of lumbar region: Secondary | ICD-10-CM | POA: Diagnosis not present

## 2015-09-29 DIAGNOSIS — M9905 Segmental and somatic dysfunction of pelvic region: Secondary | ICD-10-CM | POA: Diagnosis not present

## 2015-09-29 DIAGNOSIS — M5431 Sciatica, right side: Secondary | ICD-10-CM | POA: Diagnosis not present

## 2015-09-29 DIAGNOSIS — M5135 Other intervertebral disc degeneration, thoracolumbar region: Secondary | ICD-10-CM | POA: Diagnosis not present

## 2015-10-04 DIAGNOSIS — M9905 Segmental and somatic dysfunction of pelvic region: Secondary | ICD-10-CM | POA: Diagnosis not present

## 2015-10-04 DIAGNOSIS — M5431 Sciatica, right side: Secondary | ICD-10-CM | POA: Diagnosis not present

## 2015-10-04 DIAGNOSIS — M9903 Segmental and somatic dysfunction of lumbar region: Secondary | ICD-10-CM | POA: Diagnosis not present

## 2015-10-04 DIAGNOSIS — M5137 Other intervertebral disc degeneration, lumbosacral region: Secondary | ICD-10-CM | POA: Diagnosis not present

## 2015-10-04 DIAGNOSIS — M9902 Segmental and somatic dysfunction of thoracic region: Secondary | ICD-10-CM | POA: Diagnosis not present

## 2015-10-04 DIAGNOSIS — M5135 Other intervertebral disc degeneration, thoracolumbar region: Secondary | ICD-10-CM | POA: Diagnosis not present

## 2015-10-05 DIAGNOSIS — M9905 Segmental and somatic dysfunction of pelvic region: Secondary | ICD-10-CM | POA: Diagnosis not present

## 2015-10-05 DIAGNOSIS — M9903 Segmental and somatic dysfunction of lumbar region: Secondary | ICD-10-CM | POA: Diagnosis not present

## 2015-10-05 DIAGNOSIS — M9902 Segmental and somatic dysfunction of thoracic region: Secondary | ICD-10-CM | POA: Diagnosis not present

## 2015-10-05 DIAGNOSIS — M5135 Other intervertebral disc degeneration, thoracolumbar region: Secondary | ICD-10-CM | POA: Diagnosis not present

## 2015-10-05 DIAGNOSIS — M5137 Other intervertebral disc degeneration, lumbosacral region: Secondary | ICD-10-CM | POA: Diagnosis not present

## 2015-10-05 DIAGNOSIS — M5431 Sciatica, right side: Secondary | ICD-10-CM | POA: Diagnosis not present

## 2015-12-09 DIAGNOSIS — G5602 Carpal tunnel syndrome, left upper limb: Secondary | ICD-10-CM | POA: Diagnosis not present

## 2015-12-09 DIAGNOSIS — M25512 Pain in left shoulder: Secondary | ICD-10-CM | POA: Diagnosis not present

## 2016-01-13 ENCOUNTER — Other Ambulatory Visit: Payer: Self-pay | Admitting: *Deleted

## 2016-01-13 DIAGNOSIS — K219 Gastro-esophageal reflux disease without esophagitis: Secondary | ICD-10-CM

## 2016-01-13 MED ORDER — ESOMEPRAZOLE MAGNESIUM 40 MG PO CPDR: 40.0000 mg | DELAYED_RELEASE_CAPSULE | Freq: Every day | ORAL | 3 refills | Status: DC

## 2016-02-10 ENCOUNTER — Telehealth: Payer: Self-pay | Admitting: *Deleted

## 2016-02-13 NOTE — Telephone Encounter (Signed)
Patient returned phone call. Best # [phone]

## 2016-02-13 NOTE — Telephone Encounter (Signed)
Left message for patient to call back  

## 2016-02-14 MED ORDER — PANTOPRAZOLE SODIUM 40 MG PO TBEC: 40.0000 mg | DELAYED_RELEASE_TABLET | Freq: Every day | ORAL | 3 refills | Status: DC

## 2016-02-14 NOTE — Telephone Encounter (Signed)
Patient reports nexium is no longer working.  He is having reflux type symptoms again.  Please advise alternate PPI.

## 2016-02-14 NOTE — Telephone Encounter (Signed)
Pantoprazole 40 mg qd before breakfast # 90 [phone] RF

## 2016-02-14 NOTE — Telephone Encounter (Signed)
Patient notified rx sent 

## 2016-03-05 ENCOUNTER — Telehealth: Payer: Self-pay | Admitting: Physician Assistant

## 2016-03-05 NOTE — Telephone Encounter (Signed)
Two PPI's not working suggest that his problem may not be reflux  Please ask him for an update on his sxs - heartburn? Chest pain? Daily sxs, intermittent?

## 2016-03-05 NOTE — Telephone Encounter (Signed)
He has been on Nexium and Pantoprazole. He is having reflux symptoms again. Please advise.

## 2016-03-06 NOTE — Telephone Encounter (Signed)
LEFT MESSAGE FOR THE PATIENT TO CALL BACK AND DISCUSS.

## 2016-03-06 NOTE — Telephone Encounter (Signed)
Left message for patient to call back  

## 2016-03-06 NOTE — Telephone Encounter (Signed)
Spoke with the patient. He agrees to come in for evaluation. He works out daily at [employer]. Feels better when he is swimming. He admits to eating late at night and lying down soon after. His complaint is pressure and burning that makes him feel like he needs to belch. Belching does give him temporary relief. Antiacids give him temporary relief.  Appointment Thursday 03/08/16 at 3 pm with Nicoletta Ba, PA-C

## 2016-03-08 ENCOUNTER — Ambulatory Visit (INDEPENDENT_AMBULATORY_CARE_PROVIDER_SITE_OTHER): Payer: Medicare Other | Admitting: Physician Assistant

## 2016-03-08 ENCOUNTER — Encounter: Payer: Self-pay | Admitting: Physician Assistant

## 2016-03-08 VITALS — BP 114/72 | HR 76 | Ht 68.5 in | Wt 235.4 lb

## 2016-03-08 DIAGNOSIS — R079 Chest pain, unspecified: Secondary | ICD-10-CM | POA: Diagnosis not present

## 2016-03-08 DIAGNOSIS — K219 Gastro-esophageal reflux disease without esophagitis: Secondary | ICD-10-CM

## 2016-03-08 MED ORDER — ESOMEPRAZOLE MAGNESIUM 40 MG PO CPDR: 40.0000 mg | DELAYED_RELEASE_CAPSULE | Freq: Two times a day (BID) | ORAL | 2 refills | Status: DC

## 2016-03-08 NOTE — Patient Instructions (Signed)
We have sent the following medications to your pharmacy for you to pick up at your convenience: generic Nexium (esomeprazole) 40mg  take 1 capsule twice a day for 1 month then back to once daily.  You have been scheduled for a Cardiology appointment with Dr. Richardson Dopp on 03/14/2016 at 3:15 pm at [place] on [facility].  We have given you information on Acid reflux to read and follow.

## 2016-03-08 NOTE — Progress Notes (Signed)
Subjective:    Patient ID: Kyle Delacruz, male    DOB: 09-Jun-1955, 60 y.o.   MRN: [id number]  HPI Kyle Delacruz is a pleasant 60 year old African-American male known to Dr. Carlean Purl. He has history of GERD and diverticulosis. He had undergone an EGD in November 2016 which was a normal exam no hiatal hernia. He had been placed on Nexium 40 mg by mouth every morning. Colonoscopy in March 2013 negative with the exception of mild left sided diverticulosis and was slated for 10 year interval follow-up. Patient comes in today with intermittent complaints of chest tightness. He says he had similar symptoms last year before he came to our office and had gone to the ER for evaluation in October 2016. At that time he had EKG chest x-ray and troponins which were negative area and he has not had any other cardiac evaluation. Says after he had been on placed on Nexium he didn't have any problems for a long time. Over the past week and a half he has been having intermittent chest discomfort during the day. He says he is able to do his swimming and does not get chest tightness with that or shortness of breath and has been walking on the treadmill about a mile a day without this exacerbating any symptoms. However he says that he has noticed more chest tightness with walking any distance and has had some mild exertional dyspnea as well. He says whenever he gets a chest tightness if he lays down he feels better. No complaints of dysphagia or odynophagia, he does not feel that his chest discomfort is worse postprandially or  after heavy meals. No abdominal pain ,no nausea. He has had some increased belching. He tried taking Protonix for a short period of time instead of Nexium with no change in symptoms ,and went back to Nexium a couple of days ago but has not been off of PPI therapy at all. No regular ASA or NSAIDS He is a smoker, is obese, and has family history of coronary artery disease in his father who had bypass surgery in  his early 60s.  Review of Systems Pertinent positive and negative review of systems were noted in the above HPI section.  All other review of systems was otherwise negative.  Outpatient Encounter Prescriptions as of 03/08/2016  Medication Sig  . allopurinol (ZYLOPRIM) 300 MG tablet Take 300 mg by mouth daily.   Marland Kitchen aspirin 81 MG tablet Take 81 mg by mouth daily.  . CRESTOR 20 MG tablet Take 20 mg by mouth daily.   . fluticasone (FLONASE) 50 MCG/ACT nasal spray Place 1 spray into the nose 2 (two) times daily.   . hyoscyamine (LEVSIN SL) 0.125 MG SL tablet Take 0.125 mg by mouth every 6 (six) hours as needed.   Marland Kitchen levothyroxine (SYNTHROID, LEVOTHROID) 50 MCG tablet Take 1 tablet by mouth daily.  . pantoprazole (PROTONIX) 40 MG tablet Take 1 tablet (40 mg total) by mouth daily.  . [DISCONTINUED] esomeprazole (NEXIUM) 40 MG capsule Take 1 capsule (40 mg total) by mouth daily at 12 noon.  Marland Kitchen esomeprazole (NEXIUM) 40 MG capsule Take 1 capsule (40 mg total) by mouth 2 (two) times daily before a meal.   No facility-administered encounter medications on file as of 03/08/2016.    Allergies  Allergen Reactions  . Iohexol      Code: RASH, Desc: rash 2 years ago, needs 13 hr prep for future exams   . Penicillins Hives  . Shellfish Allergy Swelling  Patient Active Problem List   Diagnosis Date Noted  . Special screening for malignant neoplasms, colon 07/30/2011  . Diverticulosis of colon (without mention of hemorrhage) 07/30/2011   Social History   Social History  . Marital status: Single    Spouse name: N/A  . Number of children: 0  . Years of education: N/A   Occupational History  . retired Therapist, sports    Social History Main Topics  . Smoking status: Current Every Day Smoker    Packs/day: 0.50    Types: Cigarettes  . Smokeless tobacco: Never Used  . Alcohol use 8.4 oz/week    14 Cans of beer per week     Comment: 14 cans a week  . Drug use: No  . Sexual activity: Not on file   Other  Topics Concern  . Not on file   Social History Narrative  . No narrative on file    [name] family history includes Diabetes in his father and paternal grandmother; Heart disease in his father; Stroke in his mother.      Objective:    Vitals:   03/08/16 1452  BP: 114/72  Pulse: 76    Physical Exam  well-developed older African-American male in no acute distress, pleasant blood pressure 114/72 pulse 76, Height 5 foot 8, weight 235, BMI 35.2. HEENT; nontraumatic normocephalic EOMI PERRLA sclera anicteric, Cardiovascular; regular rate and rhythm with S1-S2 no murmur or gallop, Pulmonary; clear bilaterally, Abdomen ;soft nontender nondistended bowel sounds are active there is no palpable mass or hepatosplenomegaly on Rectal ;exam not done, Extremities ;no clubbing cyanosis or edema skin warm and dry, Neuropsych; mood and affect appropriate       Assessment & Plan:   #32 60 year old African-American male with history of GERD on chronic Nexium who comes in with 1-1/2 week history of intermittent chest tightness. His chest tightness is somewhat atypical with some exertional features i.e. exacerbation with walking any distance and has noticed mild shortness of breath as well, but has been able to walk on the treadmill thus far without exacerbating his chest tightness. While this may be poorly controlled GERD, patient does have risk factors for coronary artery disease including tobacco use, obesity, hyperlipidemia, and family history of coronary artery disease in his father who had bypass surgery in his early 73s. I think he needs further cardiac evaluation with stress testing to be sure he is not having anginal symptoms  Plan; we will increase Nexium to 40 mg by mouth twice a day before meals breakfast and before meals dinner for a month to see if this has any effect on his symptoms. He has  tightened up his antireflux regimen and will continue to follow a stricter antireflux  regimen.  We will obtain cardiology consultation for him for further cardiac evaluation stress test. Patient was cautioned that should he have any worsening or prolonged chest pain or shortness of breath that he should proceed to the emergency room.  He will follow-up with Dr. Carlean Purl or myself on an as-needed basis, and asked him to follow up in the office if cardiac eval is negative and above regimen not helpful.    Subrina Vecchiarelli S Docia Klar PA-C 03/08/2016   Cc: Prince Solian, MD

## 2016-03-13 NOTE — Progress Notes (Signed)
Agree with Ms. Esterwood's assessment and plan. Inaya Gillham E. Luciano Cinquemani, MD, FACG   

## 2016-03-14 ENCOUNTER — Encounter: Payer: Self-pay | Admitting: Physician Assistant

## 2016-03-14 ENCOUNTER — Ambulatory Visit (INDEPENDENT_AMBULATORY_CARE_PROVIDER_SITE_OTHER): Payer: Medicare Other | Admitting: Physician Assistant

## 2016-03-14 VITALS — BP 136/78 | HR 77 | Ht 69.0 in | Wt 232.0 lb

## 2016-03-14 DIAGNOSIS — E7800 Pure hypercholesterolemia, unspecified: Secondary | ICD-10-CM | POA: Diagnosis not present

## 2016-03-14 DIAGNOSIS — K219 Gastro-esophageal reflux disease without esophagitis: Secondary | ICD-10-CM

## 2016-03-14 DIAGNOSIS — F1010 Alcohol abuse, uncomplicated: Secondary | ICD-10-CM

## 2016-03-14 DIAGNOSIS — Z720 Tobacco use: Secondary | ICD-10-CM

## 2016-03-14 DIAGNOSIS — R072 Precordial pain: Secondary | ICD-10-CM

## 2016-03-14 NOTE — Patient Instructions (Addendum)
Medication Instructions:  Your physician recommends that you continue on your current medications as directed. Please refer to the Current Medication list given to you today.  Labwork: NONE  Testing/Procedures: Your physician has requested that you have en exercise stress myoview. For further information please visit [email or web address]. Please follow instruction sheet, as given.  Follow-Up: AS NEEDED AT THIS TIME WITH DR. Radford Pax  Any Other Special Instructions Will Be Listed Below (If Applicable).  If you need a refill on your cardiac medications before your next appointment, please call your pharmacy.

## 2016-03-14 NOTE — Progress Notes (Signed)
Cardiology Office Note:    Date:  03/14/2016   ID:  Kyle Delacruz, DOB [birth year], MRN [id number]  PCP:  Tivis Ringer, MD  Cardiologist:  New - seen by Dr. Fransico Him today   Electrophysiologist:  n/a Gastroenterology:  Dr. Carlean Purl  Referring MD: Alfredia Ferguson, PA-C   Chief Complaint  Patient presents with  . Chest Pain    Referred by Nicoletta Ba, PA-C    History of Present Illness:    Kyle Delacruz is a 60 y.o. male with a hx of tobacco abuse, OSA, HL, FHx of CAD, hypothyroidism, GERD.   Followed by GI for GERD and diverticulosis.  He was seen by Nicoletta Ba, PA-C last week for complaints of chest pain with exertion.  He is referred for further evaluation.    He had chest pain 1 year ago that sent him to the ED for evaluation.  He was treated for GERD with improved symptoms.  He started having similar symptoms a few weeks ago.  He has been having substernal chest tightness.  This is present for most of the day and seems to go away when he lays down to rest at night. He swims at the [facility] 5 days a week for 30'.  He denies chest pain or shortness of breath with exercise.  He denies chest pain related to meals.  He denies dysphagia.  He denies melena.  He denies orthopnea, PND, edema.  He denies syncope.  He has gotten dizzy/lightheaded with straining to have a bowel movement in the past.    PAD Screen 03/14/2016  Previous PAD dx? No  Previous surgical procedure? No  Pain with walking? No  Feet/toe relief with dangling? No  Painful, non-healing ulcers? No  Extremities discolored? No     Prior CV studies that were reviewed today include:    None   Past Medical History:  Diagnosis Date  . Allergy   . Arthritis    osteo  . GERD (gastroesophageal reflux disease)   . Gout   . Hyperlipidemia   . Hypothyroidism   . Sleep apnea    cpap- not wearing    Past Surgical History:  Procedure Laterality Date  . BILATERAL KNEE ARTHROSCOPY     right- 1997; left 2001   . COLONOSCOPY  multiple last 2013  . KNEE SURGERY Right 2014   torn R medial meniscus- arthroscopy done  . left hip replacement  2009  . PARATHYROIDECTOMY  2005  . right hip replacement  2006  . ROTATOR CUFF REPAIR  2004   right  . TONSILLECTOMY    . UPPER GASTROINTESTINAL ENDOSCOPY      Current Medications: Current Meds  Medication Sig  . allopurinol (ZYLOPRIM) 300 MG tablet Take 300 mg by mouth daily.   Marland Kitchen aspirin 81 MG tablet Take 81 mg by mouth daily.  . CRESTOR 20 MG tablet Take 20 mg by mouth daily.   Marland Kitchen esomeprazole (NEXIUM) 40 MG capsule Take 1 capsule (40 mg total) by mouth 2 (two) times daily before a meal.  . fluticasone (FLONASE) 50 MCG/ACT nasal spray Place 1 spray into the nose 2 (two) times daily.   Marland Kitchen levothyroxine (SYNTHROID, LEVOTHROID) 50 MCG tablet Take 1 tablet by mouth daily.  . naproxen (NAPROSYN) 500 MG tablet TAKE 1 TABLET TWICE A DAY AS NEEDED FOR PAIN  . pantoprazole (PROTONIX) 40 MG tablet Take 1 tablet (40 mg total) by mouth daily.  . Probiotic Product (ALIGN PO) Take 1 tablet by  mouth daily. PATIENT NOT SURE OF DOSE (OTC PROBIOTIC)     Allergies:   Iohexol; Penicillins; and Shellfish allergy   Social History   Social History  . Marital status: Single    Spouse name: N/A  . Number of children: 0  . Years of education: N/A   Occupational History  . retired Therapist, sports    Social History Main Topics  . Smoking status: Current Every Day Smoker    Packs/day: 0.50    Types: Cigarettes  . Smokeless tobacco: Never Used  . Alcohol use 12.6 oz/week    21 Cans of beer per week     Comment: 14 cans a week  . Drug use: No  . Sexual activity: Not Asked   Other Topics Concern  . None   Social History Narrative   Retired - Astronomer   Single   No children   4 years in [employer], [place]        Family History:  The patient's family history includes Diabetes in his father and paternal grandmother; Heart disease in his father; Stroke  in his mother.   ROS:   Please see the history of present illness.    ROS All other systems reviewed and are negative.   EKGs/Labs/Other Test Reviewed:    EKG:  EKG is  ordered today.  The ekg ordered today demonstrates NSR, HR 77, normal axis, PACs, QTc 445 ms, no acute ST changes.   Recent Labs: No results found for requested labs within last 8760 hours.   Recent Lipid Panel No results found for: CHOL, TRIG, HDL, CHOLHDL, VLDL, LDLCALC, LDLDIRECT   Physical Exam:    VS:  BP 136/78 (BP Location: Left Arm, Patient Position: Sitting, Cuff Size: Normal)   Pulse 77   Ht 5\' 9"  (1.753 m)   Wt 232 lb (105.2 kg)   BMI 34.26 kg/m     Wt Readings from Last 3 Encounters:  03/14/16 232 lb (105.2 kg)  03/08/16 235 lb 6 oz (106.8 kg)  03/17/15 248 lb (112.5 kg)     Physical Exam  ASSESSMENT:    1. Precordial pain   2. Pure hypercholesterolemia   3. Gastroesophageal reflux disease without esophagitis   4. Tobacco abuse   5. Alcohol abuse    PLAN:    In order of problems listed above:  1. Chest pain - He has atypical > typical chest pain.  He is able to exercise without symptoms.  However, he does have CRFs including FHx of CAD, HL, smoking.  His ECG is normal.  Resuming treatment for GERD has not provided much relief.  -  Arrange ETT-Myoview  -  FU with Cardiology if abnormal or Gastroenterology if abnormal.  2. HL - Continue statin.  Managed by PCP.  3. GERD - Continue proton pump inhibitor and FU with Gastroenterology as planned.  4. Tobacco abuse - I have recommended cessation.  5. ETOH abuse - I have recommended he reduce his ETOH use to no more than 1 drink a day.   Medication Adjustments/Labs and Tests Ordered: Current medicines are reviewed at length with the patient today.  Concerns regarding medicines are outlined above.  Medication changes, Labs and Tests ordered today are outlined in the Patient Instructions noted below. Patient Instructions  Medication  Instructions:  Your physician recommends that you continue on your current medications as directed. Please refer to the Current Medication list given to you today.  Labwork: NONE  Testing/Procedures:  Your physician has requested that you have en exercise stress myoview. For further information please visit [email or web address]. Please follow instruction sheet, as given.  Follow-Up: AS NEEDED AT THIS TIME WITH DR. Radford Pax  Any Other Special Instructions Will Be Listed Below (If Applicable).  If you need a refill on your cardiac medications before your next appointment, please call your pharmacy.  Signed, Richardson Dopp, PA-C  03/14/2016 4:18 PM    [facility] [address], [place], [place]  [address] Phone: [phone]; Fax: [phone]

## 2016-03-15 DIAGNOSIS — Z125 Encounter for screening for malignant neoplasm of prostate: Secondary | ICD-10-CM | POA: Diagnosis not present

## 2016-03-15 DIAGNOSIS — M109 Gout, unspecified: Secondary | ICD-10-CM | POA: Diagnosis not present

## 2016-03-15 DIAGNOSIS — I10 Essential (primary) hypertension: Secondary | ICD-10-CM | POA: Diagnosis not present

## 2016-03-15 DIAGNOSIS — E119 Type 2 diabetes mellitus without complications: Secondary | ICD-10-CM | POA: Diagnosis not present

## 2016-03-15 DIAGNOSIS — E784 Other hyperlipidemia: Secondary | ICD-10-CM | POA: Diagnosis not present

## 2016-03-15 DIAGNOSIS — E038 Other specified hypothyroidism: Secondary | ICD-10-CM | POA: Diagnosis not present

## 2016-03-19 ENCOUNTER — Telehealth (HOSPITAL_COMMUNITY): Payer: Self-pay | Admitting: *Deleted

## 2016-03-19 NOTE — Telephone Encounter (Signed)
Left message on voicemail in reference to upcoming appointment scheduled for 03/21/16. Phone number given for a call back so details instructions can be given.  Hubbard Robinson. RN

## 2016-03-21 ENCOUNTER — Ambulatory Visit (HOSPITAL_COMMUNITY): Payer: Medicare Other | Attending: Cardiology

## 2016-03-21 ENCOUNTER — Telehealth: Payer: Self-pay | Admitting: *Deleted

## 2016-03-21 ENCOUNTER — Encounter: Payer: Self-pay | Admitting: Physician Assistant

## 2016-03-21 DIAGNOSIS — R072 Precordial pain: Secondary | ICD-10-CM

## 2016-03-21 DIAGNOSIS — R079 Chest pain, unspecified: Secondary | ICD-10-CM | POA: Diagnosis not present

## 2016-03-21 DIAGNOSIS — Z8249 Family history of ischemic heart disease and other diseases of the circulatory system: Secondary | ICD-10-CM | POA: Diagnosis not present

## 2016-03-21 LAB — MYOCARDIAL PERFUSION IMAGING
CHL CUP MPHR: 162 {beats}/min
CHL CUP NUCLEAR SDS: 1
CHL CUP NUCLEAR SRS: 2
CHL CUP RESTING HR STRESS: 82 {beats}/min
CSEPED: 6 min
CSEPEW: 7 METS
CSEPHR: 106 %
CSEPPHR: 173 {beats}/min
Exercise duration (sec): 0 s
LHR: 0.27
LV dias vol: 115 mL (ref 62–150)
LV sys vol: 53 mL
SSS: 3
TID: 0.92

## 2016-03-21 MED ORDER — TECHNETIUM TC 99M TETROFOSMIN IV KIT
10.2000 | PACK | Freq: Once | INTRAVENOUS | Status: AC | PRN
  Administered 2016-03-21: 10.2 via INTRAVENOUS
  Filled 2016-03-21: qty 11

## 2016-03-21 MED ORDER — TECHNETIUM TC 99M TETROFOSMIN IV KIT
30.0000 | PACK | Freq: Once | INTRAVENOUS | Status: AC | PRN
  Administered 2016-03-21: 30 via INTRAVENOUS
  Filled 2016-03-21: qty 30

## 2016-03-21 NOTE — Telephone Encounter (Signed)
Pt notified of Myoview results and findings about elevated BP during exercise on test. Pt is agreeable to f/u in regards to BP with is PCP, he will also f/u with GI.

## 2016-03-22 DIAGNOSIS — E038 Other specified hypothyroidism: Secondary | ICD-10-CM | POA: Diagnosis not present

## 2016-03-22 DIAGNOSIS — Z1389 Encounter for screening for other disorder: Secondary | ICD-10-CM | POA: Diagnosis not present

## 2016-03-22 DIAGNOSIS — E784 Other hyperlipidemia: Secondary | ICD-10-CM | POA: Diagnosis not present

## 2016-03-22 DIAGNOSIS — M109 Gout, unspecified: Secondary | ICD-10-CM | POA: Diagnosis not present

## 2016-03-22 DIAGNOSIS — I10 Essential (primary) hypertension: Secondary | ICD-10-CM | POA: Diagnosis not present

## 2016-03-22 DIAGNOSIS — R0789 Other chest pain: Secondary | ICD-10-CM | POA: Diagnosis not present

## 2016-03-22 DIAGNOSIS — E668 Other obesity: Secondary | ICD-10-CM | POA: Diagnosis not present

## 2016-03-22 DIAGNOSIS — R972 Elevated prostate specific antigen [PSA]: Secondary | ICD-10-CM | POA: Diagnosis not present

## 2016-03-22 DIAGNOSIS — G4733 Obstructive sleep apnea (adult) (pediatric): Secondary | ICD-10-CM | POA: Diagnosis not present

## 2016-03-22 DIAGNOSIS — Z0000 Encounter for general adult medical examination without abnormal findings: Secondary | ICD-10-CM | POA: Diagnosis not present

## 2016-03-22 DIAGNOSIS — Z1212 Encounter for screening for malignant neoplasm of rectum: Secondary | ICD-10-CM | POA: Diagnosis not present

## 2016-03-22 DIAGNOSIS — Z6833 Body mass index (BMI) 33.0-33.9, adult: Secondary | ICD-10-CM | POA: Diagnosis not present

## 2016-03-22 DIAGNOSIS — K219 Gastro-esophageal reflux disease without esophagitis: Secondary | ICD-10-CM | POA: Diagnosis not present

## 2016-04-13 DIAGNOSIS — K219 Gastro-esophageal reflux disease without esophagitis: Secondary | ICD-10-CM | POA: Diagnosis not present

## 2016-04-13 DIAGNOSIS — I10 Essential (primary) hypertension: Secondary | ICD-10-CM | POA: Diagnosis not present

## 2016-04-13 DIAGNOSIS — R0789 Other chest pain: Secondary | ICD-10-CM | POA: Diagnosis not present

## 2016-04-13 DIAGNOSIS — Z6833 Body mass index (BMI) 33.0-33.9, adult: Secondary | ICD-10-CM | POA: Diagnosis not present

## 2016-05-25 DIAGNOSIS — E119 Type 2 diabetes mellitus without complications: Secondary | ICD-10-CM | POA: Diagnosis not present

## 2016-05-25 DIAGNOSIS — Z0100 Encounter for examination of eyes and vision without abnormal findings: Secondary | ICD-10-CM | POA: Diagnosis not present

## 2016-05-27 ENCOUNTER — Emergency Department (HOSPITAL_COMMUNITY)
Admission: EM | Admit: 2016-05-27 | Discharge: 2016-05-27 | Disposition: A | Discharge status: Home / Self Care | Payer: Medicare Other | Attending: Dermatology | Admitting: Dermatology

## 2016-05-27 ENCOUNTER — Encounter (HOSPITAL_COMMUNITY): Payer: Self-pay | Admitting: Oncology

## 2016-05-27 DIAGNOSIS — Z5321 Procedure and treatment not carried out due to patient leaving prior to being seen by health care provider: Secondary | ICD-10-CM | POA: Insufficient documentation

## 2016-05-27 DIAGNOSIS — R05 Cough: Secondary | ICD-10-CM | POA: Insufficient documentation

## 2016-05-27 NOTE — ED Triage Notes (Signed)
Per pt he developed chills, body aches, cough and congestion as well as fever today.  Per pt he stopped at [employer] and the cashier told him to come to the ER immediately.  Pt is A&O x 4.  VSS.  Rates pain 7/10.

## 2016-05-28 DIAGNOSIS — J320 Chronic maxillary sinusitis: Secondary | ICD-10-CM | POA: Diagnosis not present

## 2016-05-28 DIAGNOSIS — J322 Chronic ethmoidal sinusitis: Secondary | ICD-10-CM | POA: Diagnosis not present

## 2016-05-28 DIAGNOSIS — J040 Acute laryngitis: Secondary | ICD-10-CM | POA: Diagnosis not present

## 2016-05-28 DIAGNOSIS — K210 Gastro-esophageal reflux disease with esophagitis: Secondary | ICD-10-CM | POA: Diagnosis not present

## 2016-05-28 DIAGNOSIS — R490 Dysphonia: Secondary | ICD-10-CM | POA: Diagnosis not present

## 2016-06-18 DIAGNOSIS — E119 Type 2 diabetes mellitus without complications: Secondary | ICD-10-CM | POA: Diagnosis not present

## 2016-06-18 DIAGNOSIS — R972 Elevated prostate specific antigen [PSA]: Secondary | ICD-10-CM | POA: Diagnosis not present

## 2016-06-18 DIAGNOSIS — I10 Essential (primary) hypertension: Secondary | ICD-10-CM | POA: Diagnosis not present

## 2016-06-25 DIAGNOSIS — E669 Obesity, unspecified: Secondary | ICD-10-CM | POA: Diagnosis not present

## 2016-06-25 DIAGNOSIS — F17200 Nicotine dependence, unspecified, uncomplicated: Secondary | ICD-10-CM | POA: Diagnosis not present

## 2016-06-25 DIAGNOSIS — M109 Gout, unspecified: Secondary | ICD-10-CM | POA: Diagnosis not present

## 2016-06-25 DIAGNOSIS — Z6832 Body mass index (BMI) 32.0-32.9, adult: Secondary | ICD-10-CM | POA: Diagnosis not present

## 2016-06-25 DIAGNOSIS — I10 Essential (primary) hypertension: Secondary | ICD-10-CM | POA: Diagnosis not present

## 2016-06-25 DIAGNOSIS — E119 Type 2 diabetes mellitus without complications: Secondary | ICD-10-CM | POA: Diagnosis not present

## 2016-06-25 DIAGNOSIS — K219 Gastro-esophageal reflux disease without esophagitis: Secondary | ICD-10-CM | POA: Diagnosis not present

## 2016-06-25 DIAGNOSIS — R972 Elevated prostate specific antigen [PSA]: Secondary | ICD-10-CM | POA: Diagnosis not present

## 2016-06-25 DIAGNOSIS — Z1389 Encounter for screening for other disorder: Secondary | ICD-10-CM | POA: Diagnosis not present

## 2016-07-03 DIAGNOSIS — M5431 Sciatica, right side: Secondary | ICD-10-CM | POA: Diagnosis not present

## 2016-07-03 DIAGNOSIS — M9905 Segmental and somatic dysfunction of pelvic region: Secondary | ICD-10-CM | POA: Diagnosis not present

## 2016-07-03 DIAGNOSIS — M5137 Other intervertebral disc degeneration, lumbosacral region: Secondary | ICD-10-CM | POA: Diagnosis not present

## 2016-07-03 DIAGNOSIS — M5135 Other intervertebral disc degeneration, thoracolumbar region: Secondary | ICD-10-CM | POA: Diagnosis not present

## 2016-07-03 DIAGNOSIS — M9902 Segmental and somatic dysfunction of thoracic region: Secondary | ICD-10-CM | POA: Diagnosis not present

## 2016-07-03 DIAGNOSIS — M9903 Segmental and somatic dysfunction of lumbar region: Secondary | ICD-10-CM | POA: Diagnosis not present

## 2016-07-05 DIAGNOSIS — M9905 Segmental and somatic dysfunction of pelvic region: Secondary | ICD-10-CM | POA: Diagnosis not present

## 2016-07-05 DIAGNOSIS — M9902 Segmental and somatic dysfunction of thoracic region: Secondary | ICD-10-CM | POA: Diagnosis not present

## 2016-07-05 DIAGNOSIS — M9903 Segmental and somatic dysfunction of lumbar region: Secondary | ICD-10-CM | POA: Diagnosis not present

## 2016-07-05 DIAGNOSIS — M5137 Other intervertebral disc degeneration, lumbosacral region: Secondary | ICD-10-CM | POA: Diagnosis not present

## 2016-07-05 DIAGNOSIS — M5431 Sciatica, right side: Secondary | ICD-10-CM | POA: Diagnosis not present

## 2016-07-05 DIAGNOSIS — M5135 Other intervertebral disc degeneration, thoracolumbar region: Secondary | ICD-10-CM | POA: Diagnosis not present

## 2016-07-09 DIAGNOSIS — M9902 Segmental and somatic dysfunction of thoracic region: Secondary | ICD-10-CM | POA: Diagnosis not present

## 2016-07-09 DIAGNOSIS — M5135 Other intervertebral disc degeneration, thoracolumbar region: Secondary | ICD-10-CM | POA: Diagnosis not present

## 2016-07-09 DIAGNOSIS — M5137 Other intervertebral disc degeneration, lumbosacral region: Secondary | ICD-10-CM | POA: Diagnosis not present

## 2016-07-09 DIAGNOSIS — M5431 Sciatica, right side: Secondary | ICD-10-CM | POA: Diagnosis not present

## 2016-07-09 DIAGNOSIS — M9903 Segmental and somatic dysfunction of lumbar region: Secondary | ICD-10-CM | POA: Diagnosis not present

## 2016-07-09 DIAGNOSIS — M9905 Segmental and somatic dysfunction of pelvic region: Secondary | ICD-10-CM | POA: Diagnosis not present

## 2016-07-12 DIAGNOSIS — M9905 Segmental and somatic dysfunction of pelvic region: Secondary | ICD-10-CM | POA: Diagnosis not present

## 2016-07-12 DIAGNOSIS — M9903 Segmental and somatic dysfunction of lumbar region: Secondary | ICD-10-CM | POA: Diagnosis not present

## 2016-07-12 DIAGNOSIS — M5431 Sciatica, right side: Secondary | ICD-10-CM | POA: Diagnosis not present

## 2016-07-12 DIAGNOSIS — M5137 Other intervertebral disc degeneration, lumbosacral region: Secondary | ICD-10-CM | POA: Diagnosis not present

## 2016-07-12 DIAGNOSIS — M5135 Other intervertebral disc degeneration, thoracolumbar region: Secondary | ICD-10-CM | POA: Diagnosis not present

## 2016-07-12 DIAGNOSIS — M9902 Segmental and somatic dysfunction of thoracic region: Secondary | ICD-10-CM | POA: Diagnosis not present

## 2016-07-17 DIAGNOSIS — M5137 Other intervertebral disc degeneration, lumbosacral region: Secondary | ICD-10-CM | POA: Diagnosis not present

## 2016-07-17 DIAGNOSIS — M5135 Other intervertebral disc degeneration, thoracolumbar region: Secondary | ICD-10-CM | POA: Diagnosis not present

## 2016-07-17 DIAGNOSIS — M9905 Segmental and somatic dysfunction of pelvic region: Secondary | ICD-10-CM | POA: Diagnosis not present

## 2016-07-17 DIAGNOSIS — M5431 Sciatica, right side: Secondary | ICD-10-CM | POA: Diagnosis not present

## 2016-07-17 DIAGNOSIS — M9902 Segmental and somatic dysfunction of thoracic region: Secondary | ICD-10-CM | POA: Diagnosis not present

## 2016-07-17 DIAGNOSIS — M9903 Segmental and somatic dysfunction of lumbar region: Secondary | ICD-10-CM | POA: Diagnosis not present

## 2016-07-24 DIAGNOSIS — M9903 Segmental and somatic dysfunction of lumbar region: Secondary | ICD-10-CM | POA: Diagnosis not present

## 2016-07-24 DIAGNOSIS — M5137 Other intervertebral disc degeneration, lumbosacral region: Secondary | ICD-10-CM | POA: Diagnosis not present

## 2016-07-24 DIAGNOSIS — M9902 Segmental and somatic dysfunction of thoracic region: Secondary | ICD-10-CM | POA: Diagnosis not present

## 2016-07-24 DIAGNOSIS — M5431 Sciatica, right side: Secondary | ICD-10-CM | POA: Diagnosis not present

## 2016-07-24 DIAGNOSIS — M5135 Other intervertebral disc degeneration, thoracolumbar region: Secondary | ICD-10-CM | POA: Diagnosis not present

## 2016-07-24 DIAGNOSIS — M9905 Segmental and somatic dysfunction of pelvic region: Secondary | ICD-10-CM | POA: Diagnosis not present

## 2016-07-26 DIAGNOSIS — R972 Elevated prostate specific antigen [PSA]: Secondary | ICD-10-CM | POA: Diagnosis not present

## 2016-07-26 DIAGNOSIS — M9902 Segmental and somatic dysfunction of thoracic region: Secondary | ICD-10-CM | POA: Diagnosis not present

## 2016-07-26 DIAGNOSIS — M5431 Sciatica, right side: Secondary | ICD-10-CM | POA: Diagnosis not present

## 2016-07-26 DIAGNOSIS — M5137 Other intervertebral disc degeneration, lumbosacral region: Secondary | ICD-10-CM | POA: Diagnosis not present

## 2016-07-26 DIAGNOSIS — M9905 Segmental and somatic dysfunction of pelvic region: Secondary | ICD-10-CM | POA: Diagnosis not present

## 2016-07-26 DIAGNOSIS — M5135 Other intervertebral disc degeneration, thoracolumbar region: Secondary | ICD-10-CM | POA: Diagnosis not present

## 2016-07-26 DIAGNOSIS — M9903 Segmental and somatic dysfunction of lumbar region: Secondary | ICD-10-CM | POA: Diagnosis not present

## 2016-07-31 DIAGNOSIS — M9905 Segmental and somatic dysfunction of pelvic region: Secondary | ICD-10-CM | POA: Diagnosis not present

## 2016-07-31 DIAGNOSIS — M9903 Segmental and somatic dysfunction of lumbar region: Secondary | ICD-10-CM | POA: Diagnosis not present

## 2016-07-31 DIAGNOSIS — M5137 Other intervertebral disc degeneration, lumbosacral region: Secondary | ICD-10-CM | POA: Diagnosis not present

## 2016-07-31 DIAGNOSIS — M9902 Segmental and somatic dysfunction of thoracic region: Secondary | ICD-10-CM | POA: Diagnosis not present

## 2016-07-31 DIAGNOSIS — M5431 Sciatica, right side: Secondary | ICD-10-CM | POA: Diagnosis not present

## 2016-07-31 DIAGNOSIS — M5135 Other intervertebral disc degeneration, thoracolumbar region: Secondary | ICD-10-CM | POA: Diagnosis not present

## 2016-08-07 DIAGNOSIS — M9905 Segmental and somatic dysfunction of pelvic region: Secondary | ICD-10-CM | POA: Diagnosis not present

## 2016-08-07 DIAGNOSIS — M5431 Sciatica, right side: Secondary | ICD-10-CM | POA: Diagnosis not present

## 2016-08-07 DIAGNOSIS — M5137 Other intervertebral disc degeneration, lumbosacral region: Secondary | ICD-10-CM | POA: Diagnosis not present

## 2016-08-07 DIAGNOSIS — M9903 Segmental and somatic dysfunction of lumbar region: Secondary | ICD-10-CM | POA: Diagnosis not present

## 2016-08-07 DIAGNOSIS — M9902 Segmental and somatic dysfunction of thoracic region: Secondary | ICD-10-CM | POA: Diagnosis not present

## 2016-08-07 DIAGNOSIS — M5135 Other intervertebral disc degeneration, thoracolumbar region: Secondary | ICD-10-CM | POA: Diagnosis not present

## 2016-08-14 DIAGNOSIS — M5431 Sciatica, right side: Secondary | ICD-10-CM | POA: Diagnosis not present

## 2016-08-14 DIAGNOSIS — M5135 Other intervertebral disc degeneration, thoracolumbar region: Secondary | ICD-10-CM | POA: Diagnosis not present

## 2016-08-14 DIAGNOSIS — M5137 Other intervertebral disc degeneration, lumbosacral region: Secondary | ICD-10-CM | POA: Diagnosis not present

## 2016-08-14 DIAGNOSIS — M9902 Segmental and somatic dysfunction of thoracic region: Secondary | ICD-10-CM | POA: Diagnosis not present

## 2016-08-14 DIAGNOSIS — M9905 Segmental and somatic dysfunction of pelvic region: Secondary | ICD-10-CM | POA: Diagnosis not present

## 2016-08-14 DIAGNOSIS — M9903 Segmental and somatic dysfunction of lumbar region: Secondary | ICD-10-CM | POA: Diagnosis not present

## 2016-08-21 DIAGNOSIS — M5431 Sciatica, right side: Secondary | ICD-10-CM | POA: Diagnosis not present

## 2016-08-21 DIAGNOSIS — M9903 Segmental and somatic dysfunction of lumbar region: Secondary | ICD-10-CM | POA: Diagnosis not present

## 2016-08-21 DIAGNOSIS — M9902 Segmental and somatic dysfunction of thoracic region: Secondary | ICD-10-CM | POA: Diagnosis not present

## 2016-08-21 DIAGNOSIS — M5137 Other intervertebral disc degeneration, lumbosacral region: Secondary | ICD-10-CM | POA: Diagnosis not present

## 2016-08-21 DIAGNOSIS — M5135 Other intervertebral disc degeneration, thoracolumbar region: Secondary | ICD-10-CM | POA: Diagnosis not present

## 2016-08-21 DIAGNOSIS — M9905 Segmental and somatic dysfunction of pelvic region: Secondary | ICD-10-CM | POA: Diagnosis not present

## 2016-09-10 DIAGNOSIS — N400 Enlarged prostate without lower urinary tract symptoms: Secondary | ICD-10-CM | POA: Diagnosis not present

## 2016-09-10 DIAGNOSIS — R972 Elevated prostate specific antigen [PSA]: Secondary | ICD-10-CM | POA: Diagnosis not present

## 2016-10-19 DIAGNOSIS — R972 Elevated prostate specific antigen [PSA]: Secondary | ICD-10-CM | POA: Diagnosis not present

## 2016-12-07 DIAGNOSIS — R972 Elevated prostate specific antigen [PSA]: Secondary | ICD-10-CM | POA: Diagnosis not present

## 2016-12-07 DIAGNOSIS — C61 Malignant neoplasm of prostate: Secondary | ICD-10-CM | POA: Diagnosis not present

## 2017-03-14 DIAGNOSIS — M940 Chondrocostal junction syndrome [Tietze]: Secondary | ICD-10-CM | POA: Diagnosis not present

## 2017-04-19 DIAGNOSIS — K219 Gastro-esophageal reflux disease without esophagitis: Secondary | ICD-10-CM | POA: Diagnosis not present

## 2017-04-19 DIAGNOSIS — E038 Other specified hypothyroidism: Secondary | ICD-10-CM | POA: Diagnosis not present

## 2017-04-19 DIAGNOSIS — R0789 Other chest pain: Secondary | ICD-10-CM | POA: Diagnosis not present

## 2017-04-19 DIAGNOSIS — Z6834 Body mass index (BMI) 34.0-34.9, adult: Secondary | ICD-10-CM | POA: Diagnosis not present

## 2017-04-19 DIAGNOSIS — R5383 Other fatigue: Secondary | ICD-10-CM | POA: Diagnosis not present

## 2017-04-19 DIAGNOSIS — M940 Chondrocostal junction syndrome [Tietze]: Secondary | ICD-10-CM | POA: Diagnosis not present

## 2017-04-19 DIAGNOSIS — I10 Essential (primary) hypertension: Secondary | ICD-10-CM | POA: Diagnosis not present

## 2017-04-19 DIAGNOSIS — G4733 Obstructive sleep apnea (adult) (pediatric): Secondary | ICD-10-CM | POA: Diagnosis not present

## 2017-05-13 DIAGNOSIS — M9903 Segmental and somatic dysfunction of lumbar region: Secondary | ICD-10-CM | POA: Diagnosis not present

## 2017-05-13 DIAGNOSIS — M9902 Segmental and somatic dysfunction of thoracic region: Secondary | ICD-10-CM | POA: Diagnosis not present

## 2017-05-13 DIAGNOSIS — M9905 Segmental and somatic dysfunction of pelvic region: Secondary | ICD-10-CM | POA: Diagnosis not present

## 2017-05-13 DIAGNOSIS — M5135 Other intervertebral disc degeneration, thoracolumbar region: Secondary | ICD-10-CM | POA: Diagnosis not present

## 2017-05-13 DIAGNOSIS — M5431 Sciatica, right side: Secondary | ICD-10-CM | POA: Diagnosis not present

## 2017-05-13 DIAGNOSIS — M5137 Other intervertebral disc degeneration, lumbosacral region: Secondary | ICD-10-CM | POA: Diagnosis not present

## 2017-05-15 DIAGNOSIS — M9903 Segmental and somatic dysfunction of lumbar region: Secondary | ICD-10-CM | POA: Diagnosis not present

## 2017-05-15 DIAGNOSIS — M5135 Other intervertebral disc degeneration, thoracolumbar region: Secondary | ICD-10-CM | POA: Diagnosis not present

## 2017-05-15 DIAGNOSIS — M5431 Sciatica, right side: Secondary | ICD-10-CM | POA: Diagnosis not present

## 2017-05-15 DIAGNOSIS — M9905 Segmental and somatic dysfunction of pelvic region: Secondary | ICD-10-CM | POA: Diagnosis not present

## 2017-05-15 DIAGNOSIS — M5137 Other intervertebral disc degeneration, lumbosacral region: Secondary | ICD-10-CM | POA: Diagnosis not present

## 2017-05-15 DIAGNOSIS — M9902 Segmental and somatic dysfunction of thoracic region: Secondary | ICD-10-CM | POA: Diagnosis not present

## 2017-05-17 DIAGNOSIS — M25551 Pain in right hip: Secondary | ICD-10-CM | POA: Diagnosis not present

## 2017-05-17 DIAGNOSIS — M545 Low back pain: Secondary | ICD-10-CM | POA: Diagnosis not present

## 2017-05-21 DIAGNOSIS — M5137 Other intervertebral disc degeneration, lumbosacral region: Secondary | ICD-10-CM | POA: Diagnosis not present

## 2017-05-21 DIAGNOSIS — M5431 Sciatica, right side: Secondary | ICD-10-CM | POA: Diagnosis not present

## 2017-05-21 DIAGNOSIS — M9903 Segmental and somatic dysfunction of lumbar region: Secondary | ICD-10-CM | POA: Diagnosis not present

## 2017-05-21 DIAGNOSIS — M5135 Other intervertebral disc degeneration, thoracolumbar region: Secondary | ICD-10-CM | POA: Diagnosis not present

## 2017-05-21 DIAGNOSIS — M9902 Segmental and somatic dysfunction of thoracic region: Secondary | ICD-10-CM | POA: Diagnosis not present

## 2017-05-21 DIAGNOSIS — M9905 Segmental and somatic dysfunction of pelvic region: Secondary | ICD-10-CM | POA: Diagnosis not present

## 2017-05-23 DIAGNOSIS — M9902 Segmental and somatic dysfunction of thoracic region: Secondary | ICD-10-CM | POA: Diagnosis not present

## 2017-05-23 DIAGNOSIS — M9903 Segmental and somatic dysfunction of lumbar region: Secondary | ICD-10-CM | POA: Diagnosis not present

## 2017-05-23 DIAGNOSIS — M5137 Other intervertebral disc degeneration, lumbosacral region: Secondary | ICD-10-CM | POA: Diagnosis not present

## 2017-05-23 DIAGNOSIS — M5135 Other intervertebral disc degeneration, thoracolumbar region: Secondary | ICD-10-CM | POA: Diagnosis not present

## 2017-05-23 DIAGNOSIS — M5431 Sciatica, right side: Secondary | ICD-10-CM | POA: Diagnosis not present

## 2017-05-23 DIAGNOSIS — M9905 Segmental and somatic dysfunction of pelvic region: Secondary | ICD-10-CM | POA: Diagnosis not present

## 2017-05-27 DIAGNOSIS — D23112 Other benign neoplasm of skin of right lower eyelid, including canthus: Secondary | ICD-10-CM | POA: Diagnosis not present

## 2017-05-28 DIAGNOSIS — M9902 Segmental and somatic dysfunction of thoracic region: Secondary | ICD-10-CM | POA: Diagnosis not present

## 2017-05-28 DIAGNOSIS — M9903 Segmental and somatic dysfunction of lumbar region: Secondary | ICD-10-CM | POA: Diagnosis not present

## 2017-05-28 DIAGNOSIS — M5137 Other intervertebral disc degeneration, lumbosacral region: Secondary | ICD-10-CM | POA: Diagnosis not present

## 2017-05-28 DIAGNOSIS — M5431 Sciatica, right side: Secondary | ICD-10-CM | POA: Diagnosis not present

## 2017-05-28 DIAGNOSIS — M9905 Segmental and somatic dysfunction of pelvic region: Secondary | ICD-10-CM | POA: Diagnosis not present

## 2017-05-28 DIAGNOSIS — M5135 Other intervertebral disc degeneration, thoracolumbar region: Secondary | ICD-10-CM | POA: Diagnosis not present

## 2017-06-04 DIAGNOSIS — M5431 Sciatica, right side: Secondary | ICD-10-CM | POA: Diagnosis not present

## 2017-06-04 DIAGNOSIS — M9905 Segmental and somatic dysfunction of pelvic region: Secondary | ICD-10-CM | POA: Diagnosis not present

## 2017-06-04 DIAGNOSIS — M5135 Other intervertebral disc degeneration, thoracolumbar region: Secondary | ICD-10-CM | POA: Diagnosis not present

## 2017-06-04 DIAGNOSIS — M5137 Other intervertebral disc degeneration, lumbosacral region: Secondary | ICD-10-CM | POA: Diagnosis not present

## 2017-06-04 DIAGNOSIS — M9902 Segmental and somatic dysfunction of thoracic region: Secondary | ICD-10-CM | POA: Diagnosis not present

## 2017-06-04 DIAGNOSIS — M9903 Segmental and somatic dysfunction of lumbar region: Secondary | ICD-10-CM | POA: Diagnosis not present

## 2017-06-11 DIAGNOSIS — M5135 Other intervertebral disc degeneration, thoracolumbar region: Secondary | ICD-10-CM | POA: Diagnosis not present

## 2017-06-11 DIAGNOSIS — M5137 Other intervertebral disc degeneration, lumbosacral region: Secondary | ICD-10-CM | POA: Diagnosis not present

## 2017-06-11 DIAGNOSIS — M9903 Segmental and somatic dysfunction of lumbar region: Secondary | ICD-10-CM | POA: Diagnosis not present

## 2017-06-11 DIAGNOSIS — M5431 Sciatica, right side: Secondary | ICD-10-CM | POA: Diagnosis not present

## 2017-06-11 DIAGNOSIS — M9905 Segmental and somatic dysfunction of pelvic region: Secondary | ICD-10-CM | POA: Diagnosis not present

## 2017-06-11 DIAGNOSIS — M9902 Segmental and somatic dysfunction of thoracic region: Secondary | ICD-10-CM | POA: Diagnosis not present

## 2017-06-14 DIAGNOSIS — M9902 Segmental and somatic dysfunction of thoracic region: Secondary | ICD-10-CM | POA: Diagnosis not present

## 2017-06-14 DIAGNOSIS — M9905 Segmental and somatic dysfunction of pelvic region: Secondary | ICD-10-CM | POA: Diagnosis not present

## 2017-06-14 DIAGNOSIS — M9903 Segmental and somatic dysfunction of lumbar region: Secondary | ICD-10-CM | POA: Diagnosis not present

## 2017-06-14 DIAGNOSIS — M5137 Other intervertebral disc degeneration, lumbosacral region: Secondary | ICD-10-CM | POA: Diagnosis not present

## 2017-06-14 DIAGNOSIS — M5431 Sciatica, right side: Secondary | ICD-10-CM | POA: Diagnosis not present

## 2017-06-14 DIAGNOSIS — M5135 Other intervertebral disc degeneration, thoracolumbar region: Secondary | ICD-10-CM | POA: Diagnosis not present

## 2017-06-17 DIAGNOSIS — D23112 Other benign neoplasm of skin of right lower eyelid, including canthus: Secondary | ICD-10-CM | POA: Diagnosis not present

## 2017-06-18 DIAGNOSIS — M5431 Sciatica, right side: Secondary | ICD-10-CM | POA: Diagnosis not present

## 2017-06-18 DIAGNOSIS — M5135 Other intervertebral disc degeneration, thoracolumbar region: Secondary | ICD-10-CM | POA: Diagnosis not present

## 2017-06-18 DIAGNOSIS — M9902 Segmental and somatic dysfunction of thoracic region: Secondary | ICD-10-CM | POA: Diagnosis not present

## 2017-06-18 DIAGNOSIS — M5137 Other intervertebral disc degeneration, lumbosacral region: Secondary | ICD-10-CM | POA: Diagnosis not present

## 2017-06-18 DIAGNOSIS — M9905 Segmental and somatic dysfunction of pelvic region: Secondary | ICD-10-CM | POA: Diagnosis not present

## 2017-06-18 DIAGNOSIS — M9903 Segmental and somatic dysfunction of lumbar region: Secondary | ICD-10-CM | POA: Diagnosis not present

## 2017-06-25 DIAGNOSIS — M5135 Other intervertebral disc degeneration, thoracolumbar region: Secondary | ICD-10-CM | POA: Diagnosis not present

## 2017-06-25 DIAGNOSIS — M9902 Segmental and somatic dysfunction of thoracic region: Secondary | ICD-10-CM | POA: Diagnosis not present

## 2017-06-25 DIAGNOSIS — M9903 Segmental and somatic dysfunction of lumbar region: Secondary | ICD-10-CM | POA: Diagnosis not present

## 2017-06-25 DIAGNOSIS — M9905 Segmental and somatic dysfunction of pelvic region: Secondary | ICD-10-CM | POA: Diagnosis not present

## 2017-06-25 DIAGNOSIS — M5137 Other intervertebral disc degeneration, lumbosacral region: Secondary | ICD-10-CM | POA: Diagnosis not present

## 2017-06-25 DIAGNOSIS — M5431 Sciatica, right side: Secondary | ICD-10-CM | POA: Diagnosis not present

## 2017-06-27 DIAGNOSIS — M9902 Segmental and somatic dysfunction of thoracic region: Secondary | ICD-10-CM | POA: Diagnosis not present

## 2017-06-27 DIAGNOSIS — M9905 Segmental and somatic dysfunction of pelvic region: Secondary | ICD-10-CM | POA: Diagnosis not present

## 2017-06-27 DIAGNOSIS — M5431 Sciatica, right side: Secondary | ICD-10-CM | POA: Diagnosis not present

## 2017-06-27 DIAGNOSIS — M5137 Other intervertebral disc degeneration, lumbosacral region: Secondary | ICD-10-CM | POA: Diagnosis not present

## 2017-06-27 DIAGNOSIS — M9903 Segmental and somatic dysfunction of lumbar region: Secondary | ICD-10-CM | POA: Diagnosis not present

## 2017-06-27 DIAGNOSIS — M5135 Other intervertebral disc degeneration, thoracolumbar region: Secondary | ICD-10-CM | POA: Diagnosis not present

## 2017-07-02 DIAGNOSIS — M5431 Sciatica, right side: Secondary | ICD-10-CM | POA: Diagnosis not present

## 2017-07-02 DIAGNOSIS — M5135 Other intervertebral disc degeneration, thoracolumbar region: Secondary | ICD-10-CM | POA: Diagnosis not present

## 2017-07-02 DIAGNOSIS — M5137 Other intervertebral disc degeneration, lumbosacral region: Secondary | ICD-10-CM | POA: Diagnosis not present

## 2017-07-02 DIAGNOSIS — M9903 Segmental and somatic dysfunction of lumbar region: Secondary | ICD-10-CM | POA: Diagnosis not present

## 2017-07-02 DIAGNOSIS — M9905 Segmental and somatic dysfunction of pelvic region: Secondary | ICD-10-CM | POA: Diagnosis not present

## 2017-07-02 DIAGNOSIS — M9902 Segmental and somatic dysfunction of thoracic region: Secondary | ICD-10-CM | POA: Diagnosis not present

## 2017-07-04 DIAGNOSIS — H10813 Pingueculitis, bilateral: Secondary | ICD-10-CM | POA: Diagnosis not present

## 2017-07-09 DIAGNOSIS — M5431 Sciatica, right side: Secondary | ICD-10-CM | POA: Diagnosis not present

## 2017-07-09 DIAGNOSIS — M9902 Segmental and somatic dysfunction of thoracic region: Secondary | ICD-10-CM | POA: Diagnosis not present

## 2017-07-09 DIAGNOSIS — M9903 Segmental and somatic dysfunction of lumbar region: Secondary | ICD-10-CM | POA: Diagnosis not present

## 2017-07-09 DIAGNOSIS — M9905 Segmental and somatic dysfunction of pelvic region: Secondary | ICD-10-CM | POA: Diagnosis not present

## 2017-07-09 DIAGNOSIS — M5135 Other intervertebral disc degeneration, thoracolumbar region: Secondary | ICD-10-CM | POA: Diagnosis not present

## 2017-07-09 DIAGNOSIS — M5137 Other intervertebral disc degeneration, lumbosacral region: Secondary | ICD-10-CM | POA: Diagnosis not present

## 2017-07-16 DIAGNOSIS — M9903 Segmental and somatic dysfunction of lumbar region: Secondary | ICD-10-CM | POA: Diagnosis not present

## 2017-07-16 DIAGNOSIS — M9902 Segmental and somatic dysfunction of thoracic region: Secondary | ICD-10-CM | POA: Diagnosis not present

## 2017-07-16 DIAGNOSIS — M5137 Other intervertebral disc degeneration, lumbosacral region: Secondary | ICD-10-CM | POA: Diagnosis not present

## 2017-07-16 DIAGNOSIS — M9905 Segmental and somatic dysfunction of pelvic region: Secondary | ICD-10-CM | POA: Diagnosis not present

## 2017-07-16 DIAGNOSIS — M5135 Other intervertebral disc degeneration, thoracolumbar region: Secondary | ICD-10-CM | POA: Diagnosis not present

## 2017-07-16 DIAGNOSIS — M5431 Sciatica, right side: Secondary | ICD-10-CM | POA: Diagnosis not present

## 2017-07-23 DIAGNOSIS — M5137 Other intervertebral disc degeneration, lumbosacral region: Secondary | ICD-10-CM | POA: Diagnosis not present

## 2017-07-23 DIAGNOSIS — M9903 Segmental and somatic dysfunction of lumbar region: Secondary | ICD-10-CM | POA: Diagnosis not present

## 2017-07-23 DIAGNOSIS — M9905 Segmental and somatic dysfunction of pelvic region: Secondary | ICD-10-CM | POA: Diagnosis not present

## 2017-07-23 DIAGNOSIS — M9902 Segmental and somatic dysfunction of thoracic region: Secondary | ICD-10-CM | POA: Diagnosis not present

## 2017-07-23 DIAGNOSIS — M5135 Other intervertebral disc degeneration, thoracolumbar region: Secondary | ICD-10-CM | POA: Diagnosis not present

## 2017-07-23 DIAGNOSIS — M5431 Sciatica, right side: Secondary | ICD-10-CM | POA: Diagnosis not present

## 2017-07-30 DIAGNOSIS — M9903 Segmental and somatic dysfunction of lumbar region: Secondary | ICD-10-CM | POA: Diagnosis not present

## 2017-07-30 DIAGNOSIS — M5137 Other intervertebral disc degeneration, lumbosacral region: Secondary | ICD-10-CM | POA: Diagnosis not present

## 2017-07-30 DIAGNOSIS — M5135 Other intervertebral disc degeneration, thoracolumbar region: Secondary | ICD-10-CM | POA: Diagnosis not present

## 2017-07-30 DIAGNOSIS — M9905 Segmental and somatic dysfunction of pelvic region: Secondary | ICD-10-CM | POA: Diagnosis not present

## 2017-07-30 DIAGNOSIS — M5431 Sciatica, right side: Secondary | ICD-10-CM | POA: Diagnosis not present

## 2017-07-30 DIAGNOSIS — M9902 Segmental and somatic dysfunction of thoracic region: Secondary | ICD-10-CM | POA: Diagnosis not present

## 2017-08-23 DIAGNOSIS — M545 Low back pain: Secondary | ICD-10-CM | POA: Diagnosis not present

## 2017-08-27 DIAGNOSIS — M9903 Segmental and somatic dysfunction of lumbar region: Secondary | ICD-10-CM | POA: Diagnosis not present

## 2017-08-27 DIAGNOSIS — M5432 Sciatica, left side: Secondary | ICD-10-CM | POA: Diagnosis not present

## 2017-08-27 DIAGNOSIS — M5136 Other intervertebral disc degeneration, lumbar region: Secondary | ICD-10-CM | POA: Diagnosis not present

## 2017-08-27 DIAGNOSIS — M5134 Other intervertebral disc degeneration, thoracic region: Secondary | ICD-10-CM | POA: Diagnosis not present

## 2017-08-27 DIAGNOSIS — M9905 Segmental and somatic dysfunction of pelvic region: Secondary | ICD-10-CM | POA: Diagnosis not present

## 2017-08-27 DIAGNOSIS — M9902 Segmental and somatic dysfunction of thoracic region: Secondary | ICD-10-CM | POA: Diagnosis not present

## 2017-09-03 DIAGNOSIS — M9902 Segmental and somatic dysfunction of thoracic region: Secondary | ICD-10-CM | POA: Diagnosis not present

## 2017-09-03 DIAGNOSIS — M5134 Other intervertebral disc degeneration, thoracic region: Secondary | ICD-10-CM | POA: Diagnosis not present

## 2017-09-03 DIAGNOSIS — M5136 Other intervertebral disc degeneration, lumbar region: Secondary | ICD-10-CM | POA: Diagnosis not present

## 2017-09-03 DIAGNOSIS — M5432 Sciatica, left side: Secondary | ICD-10-CM | POA: Diagnosis not present

## 2017-09-03 DIAGNOSIS — M9903 Segmental and somatic dysfunction of lumbar region: Secondary | ICD-10-CM | POA: Diagnosis not present

## 2017-09-03 DIAGNOSIS — M9905 Segmental and somatic dysfunction of pelvic region: Secondary | ICD-10-CM | POA: Diagnosis not present

## 2017-09-09 DIAGNOSIS — Z716 Tobacco abuse counseling: Secondary | ICD-10-CM | POA: Diagnosis not present

## 2017-09-09 DIAGNOSIS — F17210 Nicotine dependence, cigarettes, uncomplicated: Secondary | ICD-10-CM | POA: Diagnosis not present

## 2017-09-09 DIAGNOSIS — M47816 Spondylosis without myelopathy or radiculopathy, lumbar region: Secondary | ICD-10-CM | POA: Diagnosis not present

## 2017-09-12 DIAGNOSIS — M47816 Spondylosis without myelopathy or radiculopathy, lumbar region: Secondary | ICD-10-CM | POA: Diagnosis not present

## 2017-09-30 DIAGNOSIS — M791 Myalgia, unspecified site: Secondary | ICD-10-CM | POA: Diagnosis not present

## 2017-09-30 DIAGNOSIS — M47816 Spondylosis without myelopathy or radiculopathy, lumbar region: Secondary | ICD-10-CM | POA: Diagnosis not present

## 2017-10-02 DIAGNOSIS — M19011 Primary osteoarthritis, right shoulder: Secondary | ICD-10-CM | POA: Diagnosis not present

## 2017-10-11 ENCOUNTER — Other Ambulatory Visit: Payer: Self-pay | Admitting: Physical Medicine and Rehabilitation

## 2017-10-11 DIAGNOSIS — M5440 Lumbago with sciatica, unspecified side: Principal | ICD-10-CM

## 2017-10-11 DIAGNOSIS — G8929 Other chronic pain: Secondary | ICD-10-CM

## 2017-10-13 ENCOUNTER — Ambulatory Visit
Admission: RE | Admit: 2017-10-13 | Discharge: 2017-10-13 | Disposition: A | Discharge status: Home / Self Care | Payer: Medicare Other | Source: Ambulatory Visit | Attending: Physical Medicine and Rehabilitation | Admitting: Physical Medicine and Rehabilitation

## 2017-10-13 DIAGNOSIS — M48061 Spinal stenosis, lumbar region without neurogenic claudication: Secondary | ICD-10-CM | POA: Diagnosis not present

## 2017-10-13 DIAGNOSIS — M5440 Lumbago with sciatica, unspecified side: Principal | ICD-10-CM

## 2017-10-13 DIAGNOSIS — G8929 Other chronic pain: Secondary | ICD-10-CM

## 2017-10-15 DIAGNOSIS — M5416 Radiculopathy, lumbar region: Secondary | ICD-10-CM | POA: Diagnosis not present

## 2017-10-16 DIAGNOSIS — M5416 Radiculopathy, lumbar region: Secondary | ICD-10-CM | POA: Diagnosis not present

## 2017-10-29 DIAGNOSIS — M25552 Pain in left hip: Secondary | ICD-10-CM | POA: Diagnosis not present

## 2017-10-31 DIAGNOSIS — Z96643 Presence of artificial hip joint, bilateral: Secondary | ICD-10-CM | POA: Diagnosis not present

## 2017-10-31 DIAGNOSIS — M25552 Pain in left hip: Secondary | ICD-10-CM | POA: Diagnosis not present

## 2017-11-04 DIAGNOSIS — Z96643 Presence of artificial hip joint, bilateral: Secondary | ICD-10-CM | POA: Diagnosis not present

## 2017-11-04 DIAGNOSIS — M25552 Pain in left hip: Secondary | ICD-10-CM | POA: Diagnosis not present

## 2017-11-06 DIAGNOSIS — Z96643 Presence of artificial hip joint, bilateral: Secondary | ICD-10-CM | POA: Diagnosis not present

## 2017-11-06 DIAGNOSIS — M25552 Pain in left hip: Secondary | ICD-10-CM | POA: Diagnosis not present

## 2017-11-11 DIAGNOSIS — M25552 Pain in left hip: Secondary | ICD-10-CM | POA: Diagnosis not present

## 2017-11-11 DIAGNOSIS — Z96643 Presence of artificial hip joint, bilateral: Secondary | ICD-10-CM | POA: Diagnosis not present

## 2017-11-13 DIAGNOSIS — Z96643 Presence of artificial hip joint, bilateral: Secondary | ICD-10-CM | POA: Diagnosis not present

## 2017-11-13 DIAGNOSIS — M25552 Pain in left hip: Secondary | ICD-10-CM | POA: Diagnosis not present

## 2017-11-21 DIAGNOSIS — Z96643 Presence of artificial hip joint, bilateral: Secondary | ICD-10-CM | POA: Diagnosis not present

## 2017-11-21 DIAGNOSIS — M25552 Pain in left hip: Secondary | ICD-10-CM | POA: Diagnosis not present

## 2017-11-25 DIAGNOSIS — M25552 Pain in left hip: Secondary | ICD-10-CM | POA: Diagnosis not present

## 2017-11-25 DIAGNOSIS — Z96643 Presence of artificial hip joint, bilateral: Secondary | ICD-10-CM | POA: Diagnosis not present

## 2017-11-27 DIAGNOSIS — M25552 Pain in left hip: Secondary | ICD-10-CM | POA: Diagnosis not present

## 2017-11-27 DIAGNOSIS — Z96643 Presence of artificial hip joint, bilateral: Secondary | ICD-10-CM | POA: Diagnosis not present

## 2018-02-03 DIAGNOSIS — I10 Essential (primary) hypertension: Secondary | ICD-10-CM | POA: Diagnosis not present

## 2018-02-03 DIAGNOSIS — I499 Cardiac arrhythmia, unspecified: Secondary | ICD-10-CM | POA: Diagnosis not present

## 2018-02-03 DIAGNOSIS — J309 Allergic rhinitis, unspecified: Secondary | ICD-10-CM | POA: Diagnosis not present

## 2018-02-03 DIAGNOSIS — K219 Gastro-esophageal reflux disease without esophagitis: Secondary | ICD-10-CM | POA: Diagnosis not present

## 2018-02-03 DIAGNOSIS — Z6832 Body mass index (BMI) 32.0-32.9, adult: Secondary | ICD-10-CM | POA: Diagnosis not present

## 2018-02-19 DIAGNOSIS — Z6832 Body mass index (BMI) 32.0-32.9, adult: Secondary | ICD-10-CM | POA: Diagnosis not present

## 2018-02-19 DIAGNOSIS — M898X8 Other specified disorders of bone, other site: Secondary | ICD-10-CM | POA: Diagnosis not present

## 2018-02-19 DIAGNOSIS — Z23 Encounter for immunization: Secondary | ICD-10-CM | POA: Diagnosis not present

## 2018-02-24 DIAGNOSIS — Z23 Encounter for immunization: Secondary | ICD-10-CM | POA: Diagnosis not present

## 2018-09-04 DIAGNOSIS — M25551 Pain in right hip: Secondary | ICD-10-CM | POA: Diagnosis not present

## 2018-09-04 DIAGNOSIS — M545 Low back pain: Secondary | ICD-10-CM | POA: Diagnosis not present

## 2018-09-09 DIAGNOSIS — Z96641 Presence of right artificial hip joint: Secondary | ICD-10-CM | POA: Diagnosis not present

## 2018-09-09 DIAGNOSIS — M25651 Stiffness of right hip, not elsewhere classified: Secondary | ICD-10-CM | POA: Diagnosis not present

## 2018-09-10 DIAGNOSIS — M25651 Stiffness of right hip, not elsewhere classified: Secondary | ICD-10-CM | POA: Diagnosis not present

## 2018-09-10 DIAGNOSIS — Z96641 Presence of right artificial hip joint: Secondary | ICD-10-CM | POA: Diagnosis not present

## 2018-09-16 DIAGNOSIS — M25651 Stiffness of right hip, not elsewhere classified: Secondary | ICD-10-CM | POA: Diagnosis not present

## 2018-09-16 DIAGNOSIS — Z96641 Presence of right artificial hip joint: Secondary | ICD-10-CM | POA: Diagnosis not present

## 2018-09-18 DIAGNOSIS — Z96641 Presence of right artificial hip joint: Secondary | ICD-10-CM | POA: Diagnosis not present

## 2018-09-18 DIAGNOSIS — M25651 Stiffness of right hip, not elsewhere classified: Secondary | ICD-10-CM | POA: Diagnosis not present

## 2018-09-23 DIAGNOSIS — M7061 Trochanteric bursitis, right hip: Secondary | ICD-10-CM | POA: Diagnosis not present

## 2018-09-30 DIAGNOSIS — Z96641 Presence of right artificial hip joint: Secondary | ICD-10-CM | POA: Diagnosis not present

## 2018-09-30 DIAGNOSIS — M25651 Stiffness of right hip, not elsewhere classified: Secondary | ICD-10-CM | POA: Diagnosis not present

## 2018-10-09 DIAGNOSIS — M25651 Stiffness of right hip, not elsewhere classified: Secondary | ICD-10-CM | POA: Diagnosis not present

## 2018-10-09 DIAGNOSIS — Z96641 Presence of right artificial hip joint: Secondary | ICD-10-CM | POA: Diagnosis not present

## 2018-10-13 DIAGNOSIS — M25651 Stiffness of right hip, not elsewhere classified: Secondary | ICD-10-CM | POA: Diagnosis not present

## 2018-10-13 DIAGNOSIS — Z96641 Presence of right artificial hip joint: Secondary | ICD-10-CM | POA: Diagnosis not present

## 2018-10-15 DIAGNOSIS — Z96641 Presence of right artificial hip joint: Secondary | ICD-10-CM | POA: Diagnosis not present

## 2018-10-15 DIAGNOSIS — M25651 Stiffness of right hip, not elsewhere classified: Secondary | ICD-10-CM | POA: Diagnosis not present

## 2018-10-20 DIAGNOSIS — M25651 Stiffness of right hip, not elsewhere classified: Secondary | ICD-10-CM | POA: Diagnosis not present

## 2018-10-20 DIAGNOSIS — Z96641 Presence of right artificial hip joint: Secondary | ICD-10-CM | POA: Diagnosis not present

## 2018-10-21 DIAGNOSIS — N5201 Erectile dysfunction due to arterial insufficiency: Secondary | ICD-10-CM | POA: Diagnosis not present

## 2018-10-22 DIAGNOSIS — M25651 Stiffness of right hip, not elsewhere classified: Secondary | ICD-10-CM | POA: Diagnosis not present

## 2018-10-22 DIAGNOSIS — Z96641 Presence of right artificial hip joint: Secondary | ICD-10-CM | POA: Diagnosis not present

## 2018-10-29 DIAGNOSIS — M25651 Stiffness of right hip, not elsewhere classified: Secondary | ICD-10-CM | POA: Diagnosis not present

## 2018-10-29 DIAGNOSIS — Z96641 Presence of right artificial hip joint: Secondary | ICD-10-CM | POA: Diagnosis not present

## 2018-11-04 DIAGNOSIS — M25651 Stiffness of right hip, not elsewhere classified: Secondary | ICD-10-CM | POA: Diagnosis not present

## 2018-11-04 DIAGNOSIS — Z96641 Presence of right artificial hip joint: Secondary | ICD-10-CM | POA: Diagnosis not present

## 2018-11-07 DIAGNOSIS — Z96641 Presence of right artificial hip joint: Secondary | ICD-10-CM | POA: Diagnosis not present

## 2018-11-07 DIAGNOSIS — M25651 Stiffness of right hip, not elsewhere classified: Secondary | ICD-10-CM | POA: Diagnosis not present

## 2018-11-10 DIAGNOSIS — G4733 Obstructive sleep apnea (adult) (pediatric): Secondary | ICD-10-CM | POA: Diagnosis not present

## 2018-11-10 DIAGNOSIS — E669 Obesity, unspecified: Secondary | ICD-10-CM | POA: Diagnosis not present

## 2018-11-10 DIAGNOSIS — R102 Pelvic and perineal pain: Secondary | ICD-10-CM | POA: Diagnosis not present

## 2018-11-10 DIAGNOSIS — E038 Other specified hypothyroidism: Secondary | ICD-10-CM | POA: Diagnosis not present

## 2018-11-10 DIAGNOSIS — E785 Hyperlipidemia, unspecified: Secondary | ICD-10-CM | POA: Diagnosis not present

## 2018-11-10 DIAGNOSIS — E119 Type 2 diabetes mellitus without complications: Secondary | ICD-10-CM | POA: Diagnosis not present

## 2018-11-10 DIAGNOSIS — E039 Hypothyroidism, unspecified: Secondary | ICD-10-CM | POA: Diagnosis not present

## 2018-11-10 DIAGNOSIS — M109 Gout, unspecified: Secondary | ICD-10-CM | POA: Diagnosis not present

## 2018-11-10 DIAGNOSIS — R251 Tremor, unspecified: Secondary | ICD-10-CM | POA: Diagnosis not present

## 2018-11-10 DIAGNOSIS — R809 Proteinuria, unspecified: Secondary | ICD-10-CM | POA: Diagnosis not present

## 2018-11-10 DIAGNOSIS — I10 Essential (primary) hypertension: Secondary | ICD-10-CM | POA: Diagnosis not present

## 2018-11-10 DIAGNOSIS — F17200 Nicotine dependence, unspecified, uncomplicated: Secondary | ICD-10-CM | POA: Diagnosis not present

## 2018-11-10 DIAGNOSIS — Z1331 Encounter for screening for depression: Secondary | ICD-10-CM | POA: Diagnosis not present

## 2018-11-11 DIAGNOSIS — M25651 Stiffness of right hip, not elsewhere classified: Secondary | ICD-10-CM | POA: Diagnosis not present

## 2018-11-11 DIAGNOSIS — Z96641 Presence of right artificial hip joint: Secondary | ICD-10-CM | POA: Diagnosis not present

## 2018-12-23 DIAGNOSIS — M25552 Pain in left hip: Secondary | ICD-10-CM | POA: Diagnosis not present

## 2019-01-21 DIAGNOSIS — I10 Essential (primary) hypertension: Secondary | ICD-10-CM | POA: Diagnosis not present

## 2019-01-21 DIAGNOSIS — E119 Type 2 diabetes mellitus without complications: Secondary | ICD-10-CM | POA: Diagnosis not present

## 2019-01-21 DIAGNOSIS — M5380 Other specified dorsopathies, site unspecified: Secondary | ICD-10-CM | POA: Diagnosis not present

## 2019-01-21 DIAGNOSIS — J309 Allergic rhinitis, unspecified: Secondary | ICD-10-CM | POA: Diagnosis not present

## 2019-01-22 DIAGNOSIS — M545 Low back pain: Secondary | ICD-10-CM | POA: Diagnosis not present

## 2019-01-23 DIAGNOSIS — H2513 Age-related nuclear cataract, bilateral: Secondary | ICD-10-CM | POA: Diagnosis not present

## 2019-01-23 DIAGNOSIS — H04123 Dry eye syndrome of bilateral lacrimal glands: Secondary | ICD-10-CM | POA: Diagnosis not present

## 2019-01-23 DIAGNOSIS — H524 Presbyopia: Secondary | ICD-10-CM | POA: Diagnosis not present

## 2019-02-02 DIAGNOSIS — M47816 Spondylosis without myelopathy or radiculopathy, lumbar region: Secondary | ICD-10-CM | POA: Diagnosis not present

## 2019-02-05 DIAGNOSIS — M47816 Spondylosis without myelopathy or radiculopathy, lumbar region: Secondary | ICD-10-CM | POA: Diagnosis not present

## 2019-02-09 DIAGNOSIS — Z716 Tobacco abuse counseling: Secondary | ICD-10-CM | POA: Diagnosis not present

## 2019-02-09 DIAGNOSIS — F17210 Nicotine dependence, cigarettes, uncomplicated: Secondary | ICD-10-CM | POA: Diagnosis not present

## 2019-02-09 DIAGNOSIS — M48061 Spinal stenosis, lumbar region without neurogenic claudication: Secondary | ICD-10-CM | POA: Diagnosis not present

## 2019-02-12 DIAGNOSIS — M48061 Spinal stenosis, lumbar region without neurogenic claudication: Secondary | ICD-10-CM | POA: Diagnosis not present

## 2019-02-27 DIAGNOSIS — R102 Pelvic and perineal pain: Secondary | ICD-10-CM | POA: Diagnosis not present

## 2019-02-27 DIAGNOSIS — N3000 Acute cystitis without hematuria: Secondary | ICD-10-CM | POA: Diagnosis not present

## 2019-03-02 DIAGNOSIS — E119 Type 2 diabetes mellitus without complications: Secondary | ICD-10-CM | POA: Diagnosis not present

## 2019-03-02 DIAGNOSIS — H04123 Dry eye syndrome of bilateral lacrimal glands: Secondary | ICD-10-CM | POA: Diagnosis not present

## 2019-03-02 DIAGNOSIS — H35371 Puckering of macula, right eye: Secondary | ICD-10-CM | POA: Diagnosis not present

## 2019-03-02 DIAGNOSIS — H2513 Age-related nuclear cataract, bilateral: Secondary | ICD-10-CM | POA: Diagnosis not present

## 2019-03-03 DIAGNOSIS — M47817 Spondylosis without myelopathy or radiculopathy, lumbosacral region: Secondary | ICD-10-CM | POA: Diagnosis not present

## 2019-03-03 DIAGNOSIS — M549 Dorsalgia, unspecified: Secondary | ICD-10-CM | POA: Diagnosis not present

## 2019-03-03 DIAGNOSIS — M545 Low back pain: Secondary | ICD-10-CM | POA: Diagnosis not present

## 2019-03-09 ENCOUNTER — Other Ambulatory Visit: Payer: Self-pay | Admitting: Orthopaedic Surgery

## 2019-03-09 DIAGNOSIS — M47817 Spondylosis without myelopathy or radiculopathy, lumbosacral region: Secondary | ICD-10-CM

## 2019-03-10 ENCOUNTER — Other Ambulatory Visit: Payer: Self-pay

## 2019-03-10 ENCOUNTER — Ambulatory Visit
Admission: RE | Admit: 2019-03-10 | Discharge: 2019-03-10 | Disposition: A | Discharge status: Home / Self Care | Payer: Medicare Other | Source: Ambulatory Visit | Attending: Orthopaedic Surgery | Admitting: Orthopaedic Surgery

## 2019-03-10 DIAGNOSIS — M48061 Spinal stenosis, lumbar region without neurogenic claudication: Secondary | ICD-10-CM | POA: Diagnosis not present

## 2019-03-10 DIAGNOSIS — M47817 Spondylosis without myelopathy or radiculopathy, lumbosacral region: Secondary | ICD-10-CM

## 2019-03-19 DIAGNOSIS — M47817 Spondylosis without myelopathy or radiculopathy, lumbosacral region: Secondary | ICD-10-CM | POA: Diagnosis not present

## 2019-03-19 DIAGNOSIS — M48062 Spinal stenosis, lumbar region with neurogenic claudication: Secondary | ICD-10-CM | POA: Diagnosis not present

## 2019-03-19 DIAGNOSIS — Z6832 Body mass index (BMI) 32.0-32.9, adult: Secondary | ICD-10-CM | POA: Diagnosis not present

## 2019-03-19 DIAGNOSIS — M545 Low back pain: Secondary | ICD-10-CM | POA: Diagnosis not present

## 2019-03-24 DIAGNOSIS — M48062 Spinal stenosis, lumbar region with neurogenic claudication: Secondary | ICD-10-CM | POA: Diagnosis not present

## 2019-04-06 DIAGNOSIS — M67912 Unspecified disorder of synovium and tendon, left shoulder: Secondary | ICD-10-CM | POA: Diagnosis not present

## 2019-04-07 DIAGNOSIS — M545 Low back pain: Secondary | ICD-10-CM | POA: Diagnosis not present

## 2019-04-07 DIAGNOSIS — M47817 Spondylosis without myelopathy or radiculopathy, lumbosacral region: Secondary | ICD-10-CM | POA: Diagnosis not present

## 2019-04-07 DIAGNOSIS — M48062 Spinal stenosis, lumbar region with neurogenic claudication: Secondary | ICD-10-CM | POA: Diagnosis not present

## 2019-04-13 DIAGNOSIS — M48062 Spinal stenosis, lumbar region with neurogenic claudication: Secondary | ICD-10-CM | POA: Diagnosis not present

## 2019-05-22 DIAGNOSIS — M67912 Unspecified disorder of synovium and tendon, left shoulder: Secondary | ICD-10-CM | POA: Diagnosis not present

## 2019-05-26 ENCOUNTER — Other Ambulatory Visit: Payer: Self-pay | Admitting: Orthopedic Surgery

## 2019-05-26 DIAGNOSIS — M25512 Pain in left shoulder: Secondary | ICD-10-CM

## 2019-05-28 ENCOUNTER — Other Ambulatory Visit: Payer: Self-pay

## 2019-05-28 ENCOUNTER — Ambulatory Visit
Admission: RE | Admit: 2019-05-28 | Discharge: 2019-05-28 | Disposition: A | Discharge status: Home / Self Care | Payer: Medicare Other | Source: Ambulatory Visit | Attending: Orthopedic Surgery | Admitting: Orthopedic Surgery

## 2019-05-28 DIAGNOSIS — M75112 Incomplete rotator cuff tear or rupture of left shoulder, not specified as traumatic: Secondary | ICD-10-CM | POA: Diagnosis not present

## 2019-05-28 DIAGNOSIS — M25512 Pain in left shoulder: Secondary | ICD-10-CM

## 2019-06-03 DIAGNOSIS — M67912 Unspecified disorder of synovium and tendon, left shoulder: Secondary | ICD-10-CM | POA: Diagnosis not present

## 2019-06-04 DIAGNOSIS — R05 Cough: Secondary | ICD-10-CM | POA: Diagnosis not present

## 2019-06-04 DIAGNOSIS — Z20818 Contact with and (suspected) exposure to other bacterial communicable diseases: Secondary | ICD-10-CM | POA: Diagnosis not present

## 2019-08-03 DIAGNOSIS — K219 Gastro-esophageal reflux disease without esophagitis: Secondary | ICD-10-CM | POA: Diagnosis not present

## 2019-08-03 DIAGNOSIS — J45998 Other asthma: Secondary | ICD-10-CM | POA: Diagnosis not present

## 2019-08-03 DIAGNOSIS — R0789 Other chest pain: Secondary | ICD-10-CM | POA: Diagnosis not present

## 2019-08-03 DIAGNOSIS — F17200 Nicotine dependence, unspecified, uncomplicated: Secondary | ICD-10-CM | POA: Diagnosis not present

## 2019-08-18 ENCOUNTER — Encounter: Payer: Self-pay | Admitting: Internal Medicine

## 2019-08-19 ENCOUNTER — Encounter (HOSPITAL_COMMUNITY): Payer: Self-pay | Admitting: Emergency Medicine

## 2019-08-19 ENCOUNTER — Emergency Department (HOSPITAL_COMMUNITY)
Admission: EM | Admit: 2019-08-19 | Discharge: 2019-08-20 | Disposition: A | Discharge status: Home / Self Care | Payer: Medicare Other | Attending: Emergency Medicine | Admitting: Emergency Medicine

## 2019-08-19 ENCOUNTER — Other Ambulatory Visit: Payer: Self-pay

## 2019-08-19 ENCOUNTER — Emergency Department (HOSPITAL_COMMUNITY): Payer: Medicare Other

## 2019-08-19 DIAGNOSIS — R079 Chest pain, unspecified: Secondary | ICD-10-CM

## 2019-08-19 DIAGNOSIS — Z79899 Other long term (current) drug therapy: Secondary | ICD-10-CM | POA: Diagnosis not present

## 2019-08-19 DIAGNOSIS — K219 Gastro-esophageal reflux disease without esophagitis: Secondary | ICD-10-CM | POA: Diagnosis not present

## 2019-08-19 DIAGNOSIS — E039 Hypothyroidism, unspecified: Secondary | ICD-10-CM | POA: Diagnosis not present

## 2019-08-19 DIAGNOSIS — R0789 Other chest pain: Secondary | ICD-10-CM | POA: Insufficient documentation

## 2019-08-19 DIAGNOSIS — F17210 Nicotine dependence, cigarettes, uncomplicated: Secondary | ICD-10-CM | POA: Insufficient documentation

## 2019-08-19 DIAGNOSIS — Z7982 Long term (current) use of aspirin: Secondary | ICD-10-CM | POA: Diagnosis not present

## 2019-08-19 LAB — CBC
HCT: 44.5 % (ref 39.0–52.0)
Hemoglobin: 14.9 g/dL (ref 13.0–17.0)
MCH: 31.6 pg (ref 26.0–34.0)
MCHC: 33.5 g/dL (ref 30.0–36.0)
MCV: 94.3 fL (ref 80.0–100.0)
Platelets: 161 10*3/uL (ref 150–400)
RBC: 4.72 MIL/uL (ref 4.22–5.81)
RDW: 12.6 % (ref 11.5–15.5)
WBC: 6.5 10*3/uL (ref 4.0–10.5)
nRBC: 0 % (ref 0.0–0.2)

## 2019-08-19 LAB — TROPONIN I (HIGH SENSITIVITY)
Troponin I (High Sensitivity): 5 ng/L (ref <18)
Troponin I (High Sensitivity): 5 ng/L (ref <18)

## 2019-08-19 LAB — BASIC METABOLIC PANEL
Anion gap: 12 (ref 5–15)
BUN: 8 mg/dL (ref 8–23)
CO2: 25 mmol/L (ref 22–32)
Calcium: 9.4 mg/dL (ref 8.9–10.3)
Chloride: 103 mmol/L (ref 98–111)
Creatinine, Ser: 0.94 mg/dL (ref 0.61–1.24)
GFR calc Af Amer: >60 mL/min (ref >60)
GFR calc non Af Amer: >60 mL/min (ref >60)
Glucose, Bld: 112 mg/dL — ABNORMAL HIGH (ref 70–99)
Potassium: 4.4 mmol/L (ref 3.5–5.1)
Sodium: 140 mmol/L (ref 135–145)

## 2019-08-19 MED ORDER — ALUM & MAG HYDROXIDE-SIMETH 200-200-20 MG/5ML PO SUSP
30.0000 mL | Freq: Once | ORAL | Status: AC
  Administered 2019-08-20: 30 mL via ORAL
  Filled 2019-08-19: qty 30

## 2019-08-19 MED ORDER — LIDOCAINE VISCOUS HCL 2 % MT SOLN
15.0000 mL | Freq: Once | OROMUCOSAL | Status: AC
  Administered 2019-08-20: 15 mL via ORAL
  Filled 2019-08-19: qty 15

## 2019-08-19 NOTE — ED Provider Notes (Signed)
[facility] EMERGENCY DEPARTMENT Provider Note   CSN: [id number] Arrival date & time: 08/19/19  1351     History Chief Complaint  Patient presents with  . Chest Pain    Kyle Delacruz is a 64 y.o. male.  The history is provided by the patient and medical records.  Chest Pain   64 year old male with history of seasonal allergies, arthritis, GERD, hyperlipidemia, hypothyroidism, sleep apnea, presenting to the ED with intermittent chest pain for the past 3 weeks.  States he thinks it is related to his acid reflux.  Pain is localized to midsternal region when occurring, usually associated with a lot of belching and a burning sensation.  He has noticed certain foods seem to make pain worse such as tomatoes or anything spicy.  He denies any shortness of breath.  No radiation of the pain to the back.  He has noticed some relief when drinking milk.  He was recently switched from Prilosec to [place] which seems to be helping as well.  He has no known cardiac history.  He is an occasional smoker, usually if drinking alcohol.  He walks for exercise, no exertional chest pain with it.  He has an appointment set up with GI for endoscopy, however is transitioning his care to the [facility].  He spoke with his primary care doctor today who encouraged him to be seen to rule out any cardiac disease prior to going forward with endoscopy.  Past Medical History:  Diagnosis Date  . Allergy   . Arthritis    osteo  . GERD (gastroesophageal reflux disease)   . Gout   . History of nuclear stress test    Myoview 11/17: EF 54, no ST changes, hypertensive blood pressure response, no ischemia, low risk  . Hyperlipidemia   . Hypothyroidism   . Sleep apnea    cpap- not wearing    Patient Active Problem List   Diagnosis Date Noted  . Special screening for malignant neoplasms, colon 07/30/2011  . Diverticulosis of colon (without mention of hemorrhage) 07/30/2011    Past Surgical History:   Procedure Laterality Date  . BILATERAL KNEE ARTHROSCOPY     right- 1997; left 2001  . COLONOSCOPY  multiple last 2013  . KNEE SURGERY Right 2014   torn R medial meniscus- arthroscopy done  . left hip replacement  2009  . PARATHYROIDECTOMY  2005  . right hip replacement  2006  . ROTATOR CUFF REPAIR  2004   right  . TONSILLECTOMY    . UPPER GASTROINTESTINAL ENDOSCOPY         Family History  Problem Relation Age of Onset  . Stroke Mother   . Diabetes Father   . Heart disease Father        sp CABG  . Diabetes Paternal Grandmother   . Colon cancer Neg Hx   . Stomach cancer Neg Hx   . Esophageal cancer Neg Hx   . Rectal cancer Neg Hx   . Prostate cancer Neg Hx     Social History   Tobacco Use  . Smoking status: Current Every Day Smoker    Packs/day: 0.50    Types: Cigarettes  . Smokeless tobacco: Never Used  Substance Use Topics  . Alcohol use: Yes    Alcohol/week: 21.0 standard drinks    Types: 21 Cans of beer per week    Comment: 14 cans a week  . Drug use: No    Home Medications Prior to Admission medications  Medication Sig Start Date End Date Taking? Authorizing Provider  allopurinol (ZYLOPRIM) 300 MG tablet Take 300 mg by mouth daily.  06/30/11   [provider]  aspirin 81 MG tablet Take 81 mg by mouth daily.    [provider]  CRESTOR 20 MG tablet Take 20 mg by mouth daily.  06/30/11   [provider]  esomeprazole (NEXIUM) 40 MG capsule Take 1 capsule (40 mg total) by mouth 2 (two) times daily before a meal. 03/08/16   Esterwood, Amy S, PA-C  fluticasone (FLONASE) 50 MCG/ACT nasal spray Place 1 spray into the nose 2 (two) times daily.  06/30/11   [provider]  levothyroxine (SYNTHROID, LEVOTHROID) 50 MCG tablet Take 1 tablet by mouth daily. 02/18/15   [provider]  naproxen (NAPROSYN) 500 MG tablet TAKE 1 TABLET TWICE A DAY AS NEEDED FOR PAIN 12/29/15   [provider]  pantoprazole (PROTONIX) 40 MG  tablet Take 1 tablet (40 mg total) by mouth daily. 02/14/16   Gatha Mayer, MD  Probiotic Product (ALIGN PO) Take 1 tablet by mouth daily. PATIENT NOT SURE OF DOSE (OTC PROBIOTIC)    [provider]    Allergies    Iohexol, Penicillins, and Shellfish allergy  Review of Systems   Review of Systems  Cardiovascular: Positive for chest pain.  All other systems reviewed and are negative.   Physical Exam Updated Vital Signs BP 130/70 (BP Location: Left Arm)   Pulse 89   Temp 98.2 F (36.8 C) (Oral)   Resp 14   Ht 5\' 10"  (1.778 m)   Wt 99.8 kg   SpO2 100%   BMI 31.57 kg/m   Physical Exam Vitals and nursing note reviewed.  Constitutional:      Appearance: He is well-developed.  HENT:     Head: Normocephalic and atraumatic.  Eyes:     Conjunctiva/sclera: Conjunctivae normal.     Pupils: Pupils are equal, round, and reactive to light.  Cardiovascular:     Rate and Rhythm: Normal rate and regular rhythm.     Heart sounds: Normal heart sounds.  Pulmonary:     Effort: Pulmonary effort is normal.     Breath sounds: Normal breath sounds. No decreased breath sounds or wheezing.  Abdominal:     General: Bowel sounds are normal.     Palpations: Abdomen is soft.  Musculoskeletal:        General: Normal range of motion.     Cervical back: Normal range of motion.  Skin:    General: Skin is warm and dry.  Neurological:     Mental Status: He is alert and oriented to person, place, and time.     ED Results / Procedures / Treatments   Labs (all labs ordered are listed, but only abnormal results are displayed) Labs Reviewed  BASIC METABOLIC PANEL - Abnormal; Notable for the following components:      Result Value   Glucose, Bld 112 (*)    All other components within normal limits  CBC  TROPONIN I (HIGH SENSITIVITY)  TROPONIN I (HIGH SENSITIVITY)    EKG None  Radiology DG Chest 2 View  Result Date: 08/19/2019 CLINICAL DATA:  Chest pain and tightness for the  past 2 weeks. EXAM: CHEST - 2 VIEW COMPARISON:  Chest x-ray dated February 27, 2015. FINDINGS: The heart size and mediastinal contours are within normal limits. Atherosclerotic calcification of the aortic arch. Normal pulmonary vascularity. Unchanged elevation of the right hemidiaphragm. Chronic  scarring at the right lung base. No focal consolidation, pleural effusion, or pneumothorax. No acute osseous abnormality. IMPRESSION: No active cardiopulmonary disease. Electronically Signed   By: Titus Dubin M.D.   On: 08/19/2019 14:40    Procedures Procedures (including critical care time)  Medications Ordered in ED Medications  alum & mag hydroxide-simeth (MAALOX/MYLANTA) 200-200-20 MG/5ML suspension 30 mL (30 mLs Oral Given 08/20/19 0033)    And  lidocaine (XYLOCAINE) 2 % viscous mouth solution 15 mL (15 mLs Oral Given 08/20/19 0033)    ED Course  I have reviewed the triage vital signs and the nursing notes.  Pertinent labs & imaging results that were available during my care of the patient were reviewed by me and considered in my medical decision making (see chart for details).    MDM Rules/Calculators/A&P  64 year old male presenting to the ED with chest pain.  This is been intermittent for several weeks now.  He feels like it is related to his acid reflux.  He was recently switched to [place] from [place] and has been taking Pepcid in the evening which has helped.  He also drinks milk of pain is severe which has been helping to.  He is due for EGD with GI at the [place] [facility] very soon, however his PCP was concerned about his symptoms and wanted him evaluated from a cardiac standpoint first.  He denies any known cardiac history.  He walks for exercise, no exertional pain.  No shortness of breath.  His EKG here is nonischemic.  Labs are reassuring including troponin x2.  Chest x-ray is clear.  After talking with patient about his symptoms, I feel this is likely GI related.  Will give GI cocktail and  reassess.  12:55 AM Total relief of symptoms with GI cocktail.  Feel strongly that this is GI related.  Lower suspicion for ACS, PE, dissection, or other acute cardiac event at this time given negative work-up.  Can follow-up with PCP and GI for endoscopy as scheduled.  Copies of labs given for [facility] review.  Patient will continue his home Dexilant and Pepcid, advised he can continue drinking milk or use over-the-counter Maalox for immediate relief if needed.  Should monitor diet to reduce any acidic or spicy foods as this will likely exacerbate symptoms.  He will return here for any new or acute changes.  Final Clinical Impression(s) / ED Diagnoses Final diagnoses:  Chest pain in adult  Gastroesophageal reflux disease without esophagitis    Rx / DC Orders ED Discharge Orders    None       Larene Pickett, PA-C 08/20/19 0130    Mesner, Corene Cornea, MD 08/20/19 [phone]

## 2019-08-19 NOTE — ED Triage Notes (Signed)
Pt endorses chest tightness and SOB intermittently for 3 weeks. Has GI consult for endoscope soon.

## 2019-08-20 DIAGNOSIS — R0789 Other chest pain: Secondary | ICD-10-CM | POA: Diagnosis not present

## 2019-08-20 NOTE — ED Notes (Signed)
Patient verbalizes understanding of discharge instructions. Opportunity for questioning and answers were provided. Armband removed by staff, pt discharged from ED ambulatory.   

## 2019-08-20 NOTE — Discharge Instructions (Addendum)
Can continue your dexilent and pepcid.  If you need immediate relief can try over the counter maalox or drink a glass of milk like you have been doing. Follow-up with GI for your endoscopy.  Copies of cardiac labs on back for physician review. Return here for any new/acute changes.

## 2019-09-09 DIAGNOSIS — Z125 Encounter for screening for malignant neoplasm of prostate: Secondary | ICD-10-CM | POA: Diagnosis not present

## 2019-09-09 DIAGNOSIS — E038 Other specified hypothyroidism: Secondary | ICD-10-CM | POA: Diagnosis not present

## 2019-09-09 DIAGNOSIS — E119 Type 2 diabetes mellitus without complications: Secondary | ICD-10-CM | POA: Diagnosis not present

## 2019-09-09 DIAGNOSIS — E7849 Other hyperlipidemia: Secondary | ICD-10-CM | POA: Diagnosis not present

## 2019-09-09 DIAGNOSIS — M109 Gout, unspecified: Secondary | ICD-10-CM | POA: Diagnosis not present

## 2019-09-14 DIAGNOSIS — E669 Obesity, unspecified: Secondary | ICD-10-CM | POA: Diagnosis not present

## 2019-09-14 DIAGNOSIS — E785 Hyperlipidemia, unspecified: Secondary | ICD-10-CM | POA: Diagnosis not present

## 2019-09-14 DIAGNOSIS — G4733 Obstructive sleep apnea (adult) (pediatric): Secondary | ICD-10-CM | POA: Diagnosis not present

## 2019-09-14 DIAGNOSIS — Z1331 Encounter for screening for depression: Secondary | ICD-10-CM | POA: Diagnosis not present

## 2019-09-14 DIAGNOSIS — E039 Hypothyroidism, unspecified: Secondary | ICD-10-CM | POA: Diagnosis not present

## 2019-09-14 DIAGNOSIS — E119 Type 2 diabetes mellitus without complications: Secondary | ICD-10-CM | POA: Diagnosis not present

## 2019-09-14 DIAGNOSIS — J45998 Other asthma: Secondary | ICD-10-CM | POA: Diagnosis not present

## 2019-09-14 DIAGNOSIS — F17200 Nicotine dependence, unspecified, uncomplicated: Secondary | ICD-10-CM | POA: Diagnosis not present

## 2019-09-14 DIAGNOSIS — R0789 Other chest pain: Secondary | ICD-10-CM | POA: Diagnosis not present

## 2019-09-14 DIAGNOSIS — R82998 Other abnormal findings in urine: Secondary | ICD-10-CM | POA: Diagnosis not present

## 2019-09-14 DIAGNOSIS — M109 Gout, unspecified: Secondary | ICD-10-CM | POA: Diagnosis not present

## 2019-09-14 DIAGNOSIS — Z0000 Encounter for general adult medical examination without abnormal findings: Secondary | ICD-10-CM | POA: Diagnosis not present

## 2019-09-14 DIAGNOSIS — R809 Proteinuria, unspecified: Secondary | ICD-10-CM | POA: Diagnosis not present

## 2019-09-14 DIAGNOSIS — I10 Essential (primary) hypertension: Secondary | ICD-10-CM | POA: Diagnosis not present

## 2019-09-17 DIAGNOSIS — Z1212 Encounter for screening for malignant neoplasm of rectum: Secondary | ICD-10-CM | POA: Diagnosis not present

## 2019-09-24 DIAGNOSIS — M545 Low back pain: Secondary | ICD-10-CM | POA: Diagnosis not present

## 2019-10-01 ENCOUNTER — Ambulatory Visit: Payer: Medicare Other | Admitting: Internal Medicine

## 2019-10-06 DIAGNOSIS — M25552 Pain in left hip: Secondary | ICD-10-CM | POA: Diagnosis not present

## 2019-10-07 ENCOUNTER — Other Ambulatory Visit: Payer: Self-pay | Admitting: Orthopedic Surgery

## 2019-10-07 DIAGNOSIS — M25552 Pain in left hip: Secondary | ICD-10-CM

## 2019-10-08 DIAGNOSIS — M545 Low back pain: Secondary | ICD-10-CM | POA: Diagnosis not present

## 2019-10-08 DIAGNOSIS — M48062 Spinal stenosis, lumbar region with neurogenic claudication: Secondary | ICD-10-CM | POA: Diagnosis not present

## 2019-10-08 DIAGNOSIS — M7918 Myalgia, other site: Secondary | ICD-10-CM | POA: Diagnosis not present

## 2019-10-26 DIAGNOSIS — M48062 Spinal stenosis, lumbar region with neurogenic claudication: Secondary | ICD-10-CM | POA: Diagnosis not present

## 2019-11-11 ENCOUNTER — Ambulatory Visit
Admission: RE | Admit: 2019-11-11 | Discharge: 2019-11-11 | Disposition: A | Discharge status: Home / Self Care | Payer: Medicare Other | Source: Ambulatory Visit | Attending: Orthopedic Surgery | Admitting: Orthopedic Surgery

## 2019-11-11 ENCOUNTER — Other Ambulatory Visit: Payer: Self-pay

## 2019-11-11 DIAGNOSIS — M25552 Pain in left hip: Secondary | ICD-10-CM

## 2019-11-17 ENCOUNTER — Encounter: Payer: Self-pay | Admitting: Internal Medicine

## 2019-11-17 ENCOUNTER — Ambulatory Visit (INDEPENDENT_AMBULATORY_CARE_PROVIDER_SITE_OTHER): Payer: Medicare Other | Admitting: Internal Medicine

## 2019-11-17 VITALS — BP 134/73 | HR 73 | Ht 70.0 in | Wt 226.0 lb

## 2019-11-17 DIAGNOSIS — R195 Other fecal abnormalities: Secondary | ICD-10-CM | POA: Diagnosis not present

## 2019-11-17 DIAGNOSIS — K219 Gastro-esophageal reflux disease without esophagitis: Secondary | ICD-10-CM

## 2019-11-17 MED ORDER — PEG-KCL-NACL-NASULF-NA ASC-C 100 G PO SOLR: 1.0000 | Freq: Once | ORAL | 0 refills | Status: AC

## 2019-11-17 NOTE — Progress Notes (Signed)
Kyle Delacruz 64 y.o. [birth year] [id number]  Assessment & Plan:   Encounter Diagnoses  Name Primary?  . Heme + stool Yes  . Gastroesophageal reflux disease, unspecified whether esophagitis present     Evaluate with colonoscopy. The risks and benefits as well as alternatives of endoscopic procedure(s) have been discussed and reviewed. All questions answered. The patient agrees to proceed.  Continue PPI (Dexilant) for GERD.  I appreciate the opportunity to care for this patient. CC: [name], [name], MD  Subjective:   Chief Complaint: Heme positive stool  HPI Patient is here both because he had a heme positive stool on recent testing through primary care and he has been having some chest pressure or tightness. 08/19/2019 ED chest pain neg cardiac tests - relief w/ GI cocktail He has been changed from Prilosec to [place] and he is asymptomatic regarding the chest tightness now.  He is not having any rectal bleeding or change in bowel habits. He had a negative screening colonoscopy by Dr. Verl Blalock in 2013.  Prior GI and other history as below 2016 EGD NL me - GERD "chest tightness" 2017 neg Myoview  Qd then bid PPI 2017 2013 colonoscopy NL DRP  Allergies  Allergen Reactions  . Iohexol      Code: RASH, Desc: rash 2 years ago, needs 13 hr prep for future exams   . Penicillins Hives  . Shellfish Allergy Swelling   Current Meds  Medication Sig  . allopurinol (ZYLOPRIM) 300 MG tablet Take 300 mg by mouth daily.   Marland Kitchen aspirin 81 MG tablet Take 81 mg by mouth daily.  . CRESTOR 20 MG tablet Take 20 mg by mouth daily.   Marland Kitchen esomeprazole (NEXIUM) 40 MG capsule Take 1 capsule (40 mg total) by mouth 2 (two) times daily before a meal.  . fluticasone (FLONASE) 50 MCG/ACT nasal spray Place 1 spray into the nose 2 (two) times daily.   Marland Kitchen levothyroxine (SYNTHROID, LEVOTHROID) 50 MCG tablet Take 1 tablet by mouth daily.  . naproxen (NAPROSYN) 500 MG tablet TAKE 1 TABLET TWICE  A DAY AS NEEDED FOR PAIN  . Probiotic Product (ALIGN PO) Take 1 tablet by mouth daily. PATIENT NOT SURE OF DOSE (OTC PROBIOTIC)   Past Medical History:  Diagnosis Date  . Allergy   . Arthritis    osteo  . CTS (carpal tunnel syndrome)   . DJD (degenerative joint disease)   . DM2 (diabetes mellitus, type 2) ([place])   . GERD (gastroesophageal reflux disease)   . Gout   . History of nuclear stress test    Myoview 11/17: EF 54, no ST changes, hypertensive blood pressure response, no ischemia, low risk  . Hyperlipidemia   . Hypothyroidism   . Nicotine addiction   . OSA (obstructive sleep apnea)   . Prostatitis   . Reactive airway disease   . Sarcocystosis   . Sinus arrhythmia   . Sleep apnea    cpap- not wearing  . Tinea corporis    Past Surgical History:  Procedure Laterality Date  . BILATERAL KNEE ARTHROSCOPY     right- 1997; left 2001  . COLONOSCOPY  multiple last 2013  . KNEE SURGERY Right 2014   torn R medial meniscus- arthroscopy done  . PARATHYROIDECTOMY  2005  . ROTATOR CUFF REPAIR  2004   right  . TONSILLECTOMY    . TOTAL HIP ARTHROPLASTY Left 2009  . TOTAL HIP ARTHROPLASTY Right 2006  . UPPER GASTROINTESTINAL ENDOSCOPY     Social  History   Social History Narrative   Retired/disabled (bilateral hip replacements)- Astronomer   Single   No children   4 years in [place] [employer] - [employer] [place], [place]   Former smoker 1 alcoholic beverage daily no other tobacco or drug use   family history includes Diabetes in his father and paternal grandmother; Heart disease in his father; Stroke in his mother.   Review of Systems As above. The remainder of the review of systems is negative.  Objective:   Physical Exam BP 134/73   Pulse 73   Ht 5\' 10"  (1.778 m)   Wt 226 lb (102.5 kg)   BMI 32.43 kg/m  NAD Lungs CTA Cor NL abd soft NT BS + no HSM/mass Ext no c/c/e

## 2019-11-17 NOTE — Patient Instructions (Signed)
If you are age 62 or older, your body mass index should be between 23-30. Your Body mass index is 32.43 kg/m. If this is out of the aforementioned range listed, please consider follow up with your Primary Care Provider.  If you are age 68 or younger, your body mass index should be between 19-25. Your Body mass index is 32.43 kg/m. If this is out of the aformentioned range listed, please consider follow up with your Primary Care Provider.   You have been scheduled for a colonoscopy. Please follow written instructions given to you at your visit today.  Please pick up your prep supplies at the pharmacy within the next 1-3 days. If you use inhalers (even only as needed), please bring them with you on the day of your procedure.  Due to recent changes in healthcare laws, you may see the results of your imaging and laboratory studies on MyChart before your provider has had a chance to review them.  We understand that in some cases there may be results that are confusing or concerning to you. Not all laboratory results come back in the same time frame and the provider may be waiting for multiple results in order to interpret others.  Please give [place] 48 hours in order for your provider to thoroughly review all the results before contacting the office for clarification of your results.   I appreciate the opportunity to care for you. Silvano Rusk, MD, [facility]

## 2019-12-21 ENCOUNTER — Encounter: Payer: Self-pay | Admitting: Internal Medicine

## 2019-12-21 ENCOUNTER — Other Ambulatory Visit: Payer: Self-pay

## 2019-12-21 ENCOUNTER — Ambulatory Visit (AMBULATORY_SURGERY_CENTER): Payer: Medicare Other | Admitting: Internal Medicine

## 2019-12-21 VITALS — BP 113/77 | HR 77 | Temp 97.9°F | Resp 19 | Ht 70.0 in | Wt 226.0 lb

## 2019-12-21 DIAGNOSIS — K219 Gastro-esophageal reflux disease without esophagitis: Secondary | ICD-10-CM | POA: Diagnosis not present

## 2019-12-21 DIAGNOSIS — R195 Other fecal abnormalities: Secondary | ICD-10-CM | POA: Diagnosis not present

## 2019-12-21 DIAGNOSIS — K5730 Diverticulosis of large intestine without perforation or abscess without bleeding: Secondary | ICD-10-CM | POA: Diagnosis not present

## 2019-12-21 DIAGNOSIS — D122 Benign neoplasm of ascending colon: Secondary | ICD-10-CM

## 2019-12-21 DIAGNOSIS — G4733 Obstructive sleep apnea (adult) (pediatric): Secondary | ICD-10-CM | POA: Diagnosis not present

## 2019-12-21 DIAGNOSIS — E039 Hypothyroidism, unspecified: Secondary | ICD-10-CM | POA: Diagnosis not present

## 2019-12-21 DIAGNOSIS — D125 Benign neoplasm of sigmoid colon: Secondary | ICD-10-CM

## 2019-12-21 MED ORDER — SODIUM CHLORIDE 0.9 % IV SOLN: 500.0000 mL | Freq: Once | INTRAVENOUS | Status: DC

## 2019-12-21 NOTE — Progress Notes (Signed)
Report to PACU, RN, vss, BBS= Clear.  

## 2019-12-21 NOTE — Patient Instructions (Addendum)
I found and removed two polyps that look benign - at least one is likely pre-cancerous (not cancer but might have become if not removed).  You also have a condition called diverticulosis - common and not usually a problem. Please read the handout provided.  I will let you know pathology results and when to have another routine colonoscopy by mail and/or My Chart.  I appreciate the opportunity to care for you. Gatha Mayer, MD, [facility]  No aspirin, ibuprofen, naproxen, or other non-steriodal anti-inflammatory drugs for 2 weeks after polyp removal. (Including daily aspirin 81mg .)  Handouts provided on polyps and diverticulosis.    YOU HAD AN ENDOSCOPIC PROCEDURE TODAY AT [facility]:   Refer to the procedure report that was given to you for any specific questions about what was found during the examination.  If the procedure report does not answer your questions, please call your gastroenterologist to clarify.  If you requested that your care partner not be given the details of your procedure findings, then the procedure report has been included in a sealed envelope for you to review at your convenience later.  YOU SHOULD EXPECT: Some feelings of bloating in the abdomen. Passage of more gas than usual.  Walking can help get rid of the air that was put into your GI tract during the procedure and reduce the bloating. If you had a lower endoscopy (such as a colonoscopy or flexible sigmoidoscopy) you may notice spotting of blood in your stool or on the toilet paper. If you underwent a bowel prep for your procedure, you may not have a normal bowel movement for a few days.  Please Note:  You might notice some irritation and congestion in your nose or some drainage.  This is from the oxygen used during your procedure.  There is no need for concern and it should clear up in a day or so.  SYMPTOMS TO REPORT IMMEDIATELY:   Following lower endoscopy (colonoscopy or flexible  sigmoidoscopy):  Excessive amounts of blood in the stool  Significant tenderness or worsening of abdominal pains  Swelling of the abdomen that is new, acute  Fever of 100F or higher  For urgent or emergent issues, a gastroenterologist can be reached at any hour by calling [phone]. Do not use MyChart messaging for urgent concerns.    DIET:  We do recommend a small meal at first, but then you may proceed to your regular diet.  Drink plenty of fluids but you should avoid alcoholic beverages for 24 hours.  ACTIVITY:  You should plan to take it easy for the rest of today and you should NOT DRIVE or use heavy machinery until tomorrow (because of the sedation medicines used during the test).    FOLLOW UP: Our staff will call the number listed on your records 48-72 hours following your procedure to check on you and address any questions or concerns that you may have regarding the information given to you following your procedure. If we do not reach you, we will leave a message.  We will attempt to reach you two times.  During this call, we will ask if you have developed any symptoms of COVID 19. If you develop any symptoms (ie: fever, flu-like symptoms, shortness of breath, cough etc.) before then, please call [phone].  If you test positive for Covid 19 in the 2 weeks post procedure, please call and report this information to [place].    If any biopsies were taken you will  be contacted by phone or by letter within the next 1-3 weeks.  Please call us at [phone] if you have not heard about the biopsies in 3 weeks.    SIGNATURES/CONFIDENTIALITY: You and/or your care partner have signed paperwork which will be entered into your electronic medical record.  These signatures attest to the fact that that the information above on your After Visit Summary has been reviewed and is understood.  Full responsibility of the confidentiality of this discharge information lies with you and/or your  care-partner.

## 2019-12-21 NOTE — Progress Notes (Signed)
Called to room to assist during endoscopic procedure.  Patient ID and intended procedure confirmed with present staff. Received instructions for my participation in the procedure from the performing physician.  

## 2019-12-21 NOTE — Op Note (Signed)
[place] Patient Name: Kyle Delacruz Procedure Date: 12/21/2019 10:20 AM MRN: [id number] Endoscopist: Gatha Mayer , MD Age: 64 Referring MD:  Date of Birth: Jan 15, 1956 Gender: Male Account #: [id number] Procedure:                Colonoscopy Indications:              Positive fecal immunochemical test Medicines:                Propofol per Anesthesia, Monitored Anesthesia Care Procedure:                Pre-Anesthesia Assessment:                           - Prior to the procedure, a History and Physical                            was performed, and patient medications and                            allergies were reviewed. The patient's tolerance of                            previous anesthesia was also reviewed. The risks                            and benefits of the procedure and the sedation                            options and risks were discussed with the patient.                            All questions were answered, and informed consent                            was obtained. Prior Anticoagulants: The patient has                            taken no previous anticoagulant or antiplatelet                            agents. ASA Grade Assessment: III - A patient with                            severe systemic disease. After reviewing the risks                            and benefits, the patient was deemed in                            satisfactory condition to undergo the procedure.                           After obtaining informed consent, the colonoscope  was passed under direct vision. Throughout the                            procedure, the patient's blood pressure, pulse, and                            oxygen saturations were monitored continuously. The                            Colonoscope was introduced through the anus and                            advanced to the the cecum, identified by                             appendiceal orifice and ileocecal valve. The                            colonoscopy was performed without difficulty. The                            patient tolerated the procedure well. The quality                            of the bowel preparation was good. The ileocecal                            valve, appendiceal orifice, and rectum were                            photographed. The bowel preparation used was                            MoviPrep via split dose instruction. Scope In: 10:44:55 AM Scope Out: 10:58:56 AM Scope Withdrawal Time: 0 hours 10 minutes 2 seconds  Total Procedure Duration: 0 hours 14 minutes 1 second  Findings:                 The perianal and digital rectal examinations were                            normal. Pertinent negatives include normal prostate                            (size, shape, and consistency).                           A 10 mm polyp was found in the proximal sigmoid                            colon. The polyp was pedunculated. The polyp was                            removed with a hot snare. Resection and retrieval  were complete. Verification of patient                            identification for the specimen was done. Estimated                            blood loss: none.                           A diminutive polyp was found in the ascending                            colon. The polyp was pedunculated. The polyp was                            removed with a cold snare. Resection and retrieval                            were complete. Verification of patient                            identification for the specimen was done. Estimated                            blood loss was minimal.                           Multiple diverticula were found in the sigmoid                            colon.                           The exam was otherwise without abnormality on                            direct and retroflexion  views. Complications:            No immediate complications. Estimated Blood Loss:     Estimated blood loss was minimal. Impression:               - One 10 mm polyp in the proximal sigmoid colon,                            removed with a hot snare. Resected and retrieved.                           - One diminutive polyp in the ascending colon,                            removed with a cold snare. Resected and retrieved.                           - Diverticulosis in the sigmoid colon.                           -  The examination was otherwise normal on direct                            and retroflexion views. Recommendation:           - Patient has a contact number available for                            emergencies. The signs and symptoms of potential                            delayed complications were discussed with the                            patient. Return to normal activities tomorrow.                            Written discharge instructions were provided to the                            patient.                           - Resume previous diet.                           - Continue present medications.                           - No aspirin, ibuprofen, naproxen, or other                            non-steroidal anti-inflammatory drugs for 2 weeks                            after polyp removal.                           - Repeat colonoscopy is recommended for                            surveillance. The colonoscopy date will be                            determined after pathology results from today's                            exam become available for review. Gatha Mayer, MD 12/21/2019 11:07:58 AM This report has been signed electronically.

## 2019-12-21 NOTE — Progress Notes (Signed)
Pt's states no medical or surgical changes since previsit or office visit.  Courtney- Vitals 

## 2019-12-23 ENCOUNTER — Telehealth: Payer: Self-pay | Admitting: *Deleted

## 2019-12-23 ENCOUNTER — Telehealth: Payer: Self-pay

## 2019-12-23 NOTE — Telephone Encounter (Signed)
1. Have you developed a fever since your procedure? no  2.   Have you had an respiratory symptoms (SOB or cough) since your procedure? no  3.   Have you tested positive for COVID 19 since your procedure no  4.   Have you had any family members/close contacts diagnosed with the COVID 19 since your procedure?  no   If yes to any of these questions please route to Joylene John, RN and Joella Prince, RN   Follow up Call-  Call back number 12/21/2019  Post procedure Call Back phone  # [phone]  Permission to leave phone message Yes  Some recent data might be hidden     Patient questions:  Do you have a fever, pain , or abdominal swelling? No. Pain Score  0 *  Have you tolerated food without any problems? Yes.    Have you been able to return to your normal activities? Yes.    Do you have any questions about your discharge instructions: Diet   No. Medications  No. Follow up visit  No.  Do you have questions or concerns about your Care? No.  Actions: * If pain score is 4 or above: No action needed, pain <4.

## 2019-12-23 NOTE — Telephone Encounter (Signed)
First post procedure follow up call, no answer 

## 2019-12-29 ENCOUNTER — Encounter: Payer: Self-pay | Admitting: Internal Medicine

## 2019-12-29 DIAGNOSIS — Z860101 Personal history of adenomatous and serrated colon polyps: Secondary | ICD-10-CM | POA: Insufficient documentation

## 2019-12-29 DIAGNOSIS — Z86010 Personal history of colonic polyps: Secondary | ICD-10-CM

## 2019-12-29 HISTORY — DX: Personal history of colonic polyps: Z86.010

## 2019-12-29 HISTORY — DX: Personal history of adenomatous and serrated colon polyps: Z86.0101

## 2020-02-01 DIAGNOSIS — F17210 Nicotine dependence, cigarettes, uncomplicated: Secondary | ICD-10-CM | POA: Diagnosis not present

## 2020-02-01 DIAGNOSIS — M48061 Spinal stenosis, lumbar region without neurogenic claudication: Secondary | ICD-10-CM | POA: Diagnosis not present

## 2020-02-16 DIAGNOSIS — M48061 Spinal stenosis, lumbar region without neurogenic claudication: Secondary | ICD-10-CM | POA: Diagnosis not present

## 2020-02-23 DIAGNOSIS — M5442 Lumbago with sciatica, left side: Secondary | ICD-10-CM | POA: Diagnosis not present

## 2020-02-23 DIAGNOSIS — R0789 Other chest pain: Secondary | ICD-10-CM | POA: Diagnosis not present

## 2020-02-23 DIAGNOSIS — J40 Bronchitis, not specified as acute or chronic: Secondary | ICD-10-CM | POA: Diagnosis not present

## 2020-02-23 DIAGNOSIS — R0602 Shortness of breath: Secondary | ICD-10-CM | POA: Diagnosis not present

## 2020-04-18 DIAGNOSIS — J189 Pneumonia, unspecified organism: Secondary | ICD-10-CM | POA: Diagnosis not present

## 2020-04-18 DIAGNOSIS — R0602 Shortness of breath: Secondary | ICD-10-CM | POA: Diagnosis not present

## 2020-04-18 DIAGNOSIS — Z1152 Encounter for screening for COVID-19: Secondary | ICD-10-CM | POA: Diagnosis not present

## 2020-04-18 DIAGNOSIS — J45998 Other asthma: Secondary | ICD-10-CM | POA: Diagnosis not present

## 2020-04-23 IMAGING — MR MR SHOULDER*L* W/O CM
4 of 5 series · 20 of 40 positions shown · non-contrast
Comparison: 04/06/2019 radiographs

CLINICAL DATA: Worsening left shoulder pain over the last month.

EXAM:
MRI OF THE LEFT SHOULDER WITHOUT CONTRAST
TECHNIQUE: Multiplanar, multisequence MR imaging of the shoulder was performed.
No intravenous contrast was administered.

[Series 6: PD fat-sat · axial · left · 4.0mm · 0.44mm/px · z∈[-110,-3]mm · 8 of 24 slices shown (1 of 2)]
[im 1/24]
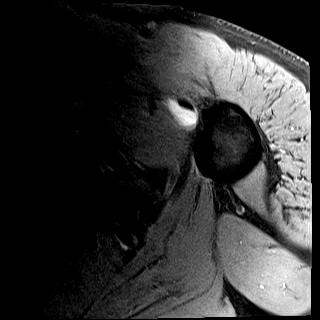
[im 4/24]
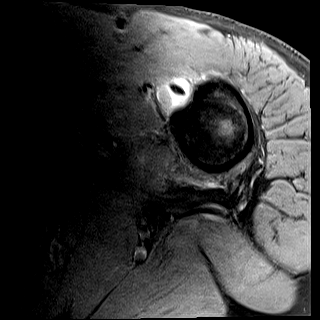
[im 7/24]
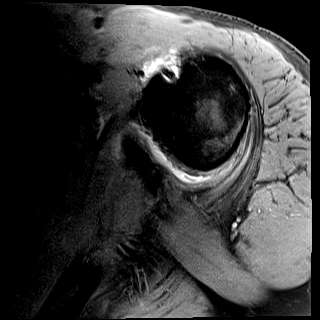
[im 10/24]
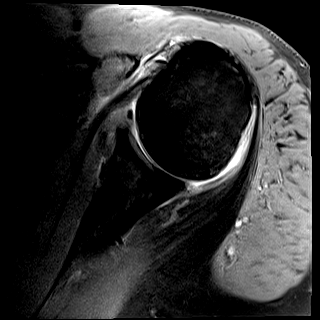
[im 14/24]
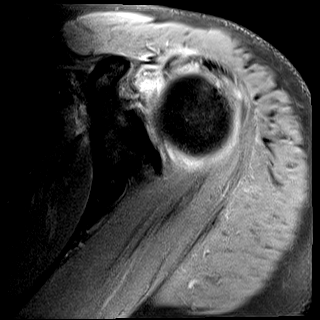
[im 17/24]
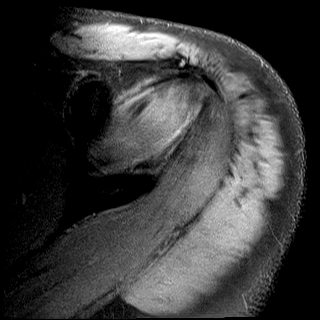
[im 20/24]
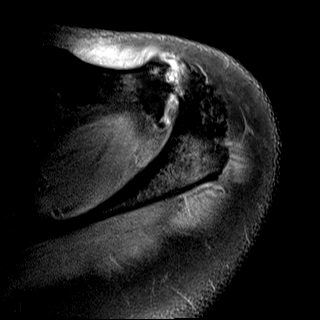
[im 24/24]
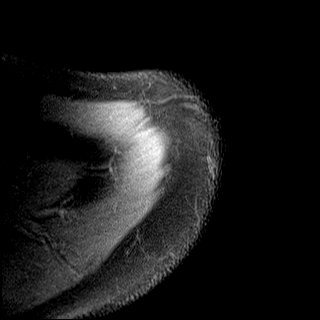

[Series 9: T2 fat-sat · oblique · left · 4.0mm · 0.22mm/px · 3 of 21 slices shown (1 of 2)]
[im 4/21]
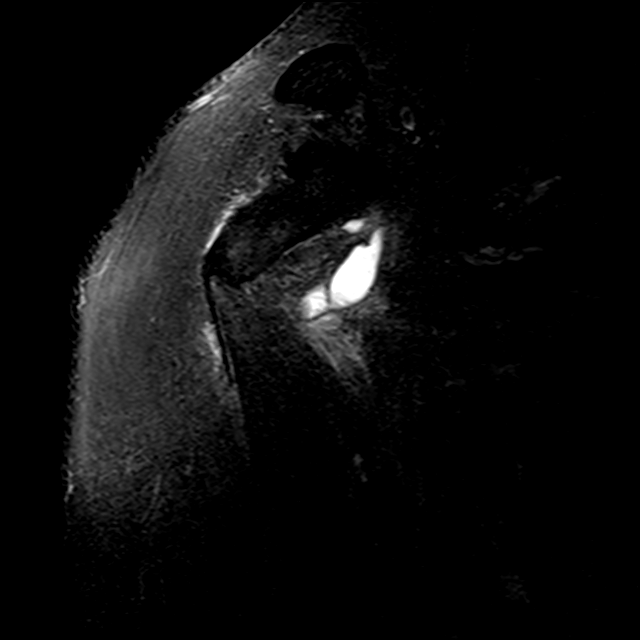
[im 11/21]
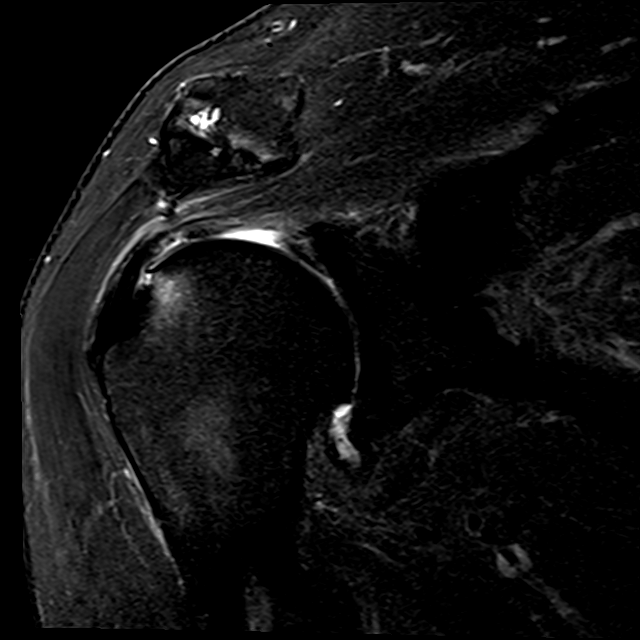
[im 17/21]
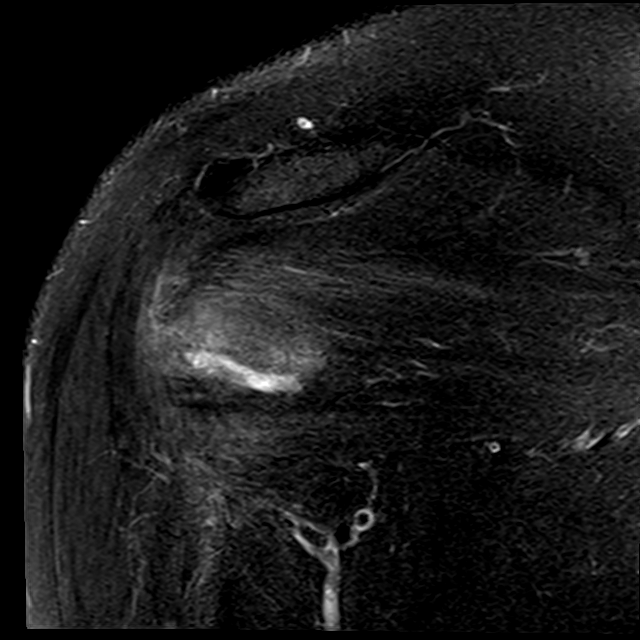

[Series 10: PD fat-sat · oblique · left · 4.0mm · 0.22mm/px · 6 of 21 slices shown (2 of 2)]
[im 1/21]
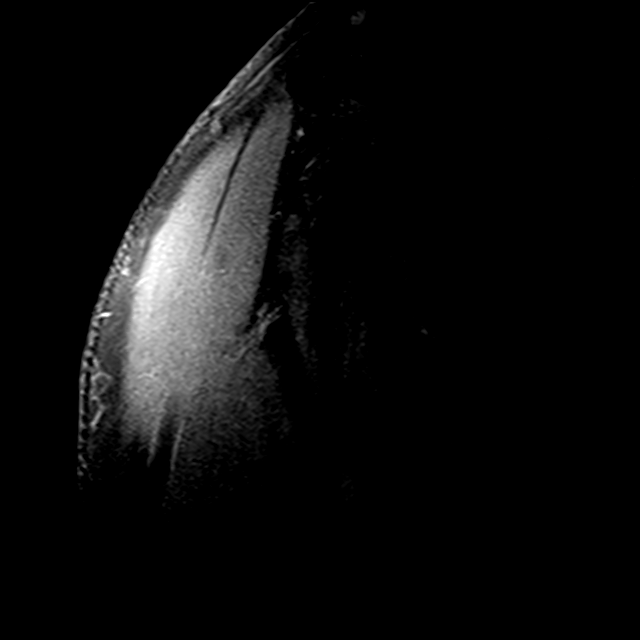
[im 4/21]
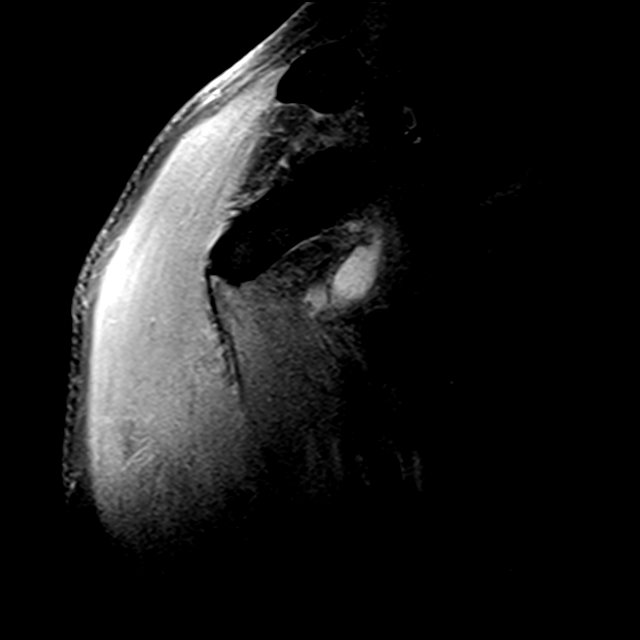
[im 7/21]
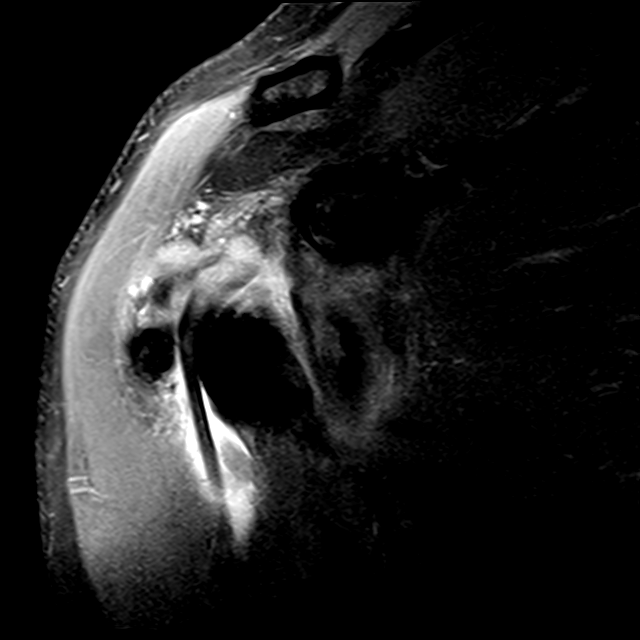
[im 11/21]
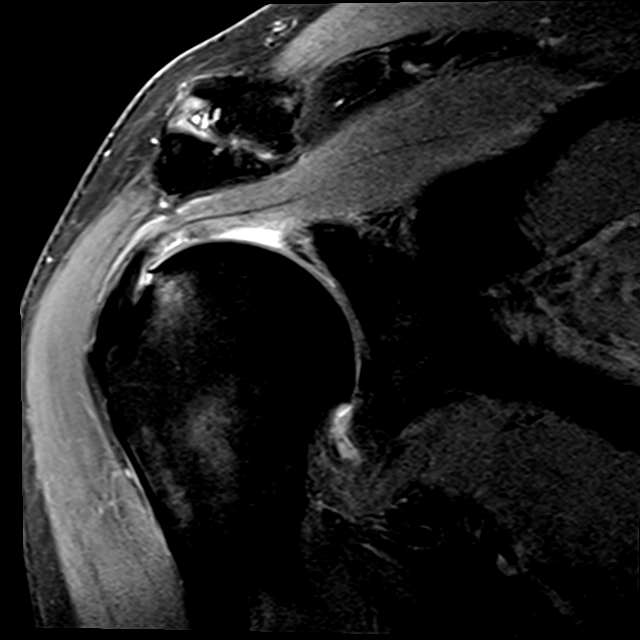
[im 14/21]
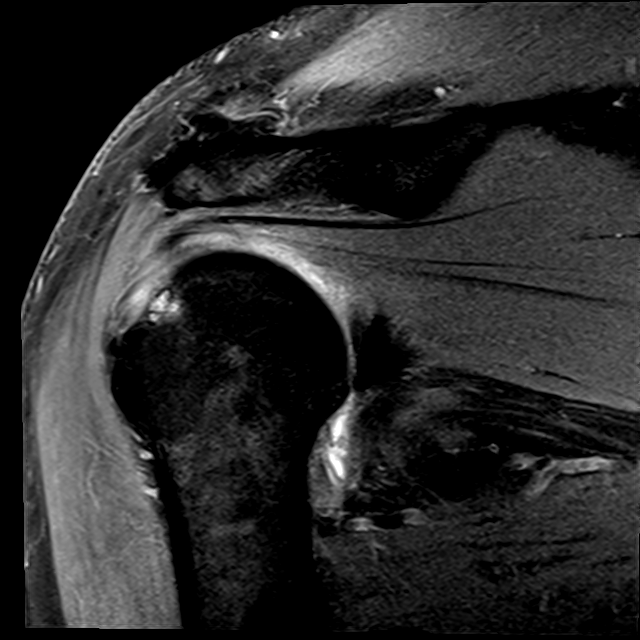
[im 17/21]
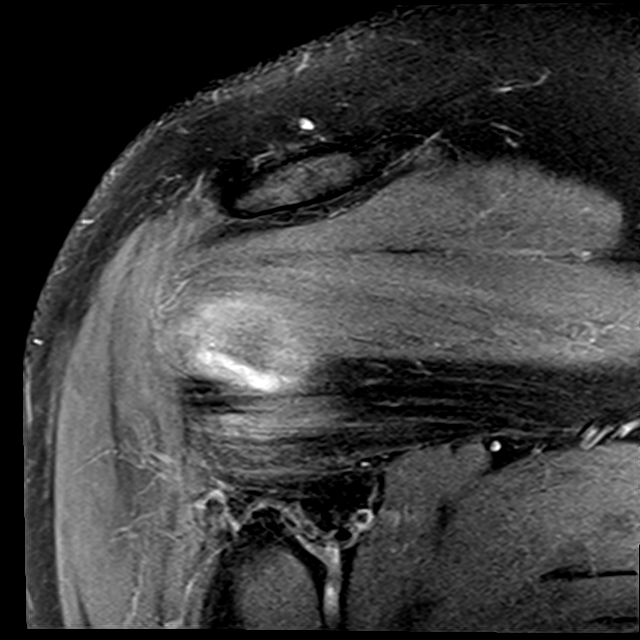

[Series 11: T2 fat-sat · oblique · left · 4.0mm · 0.44mm/px · 3 of 25 slices shown (2 of 2)]
[im 4/25]
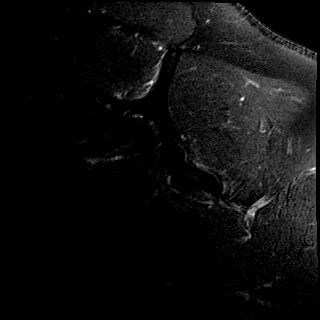
[im 13/25]
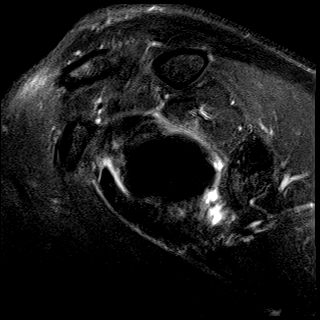
[im 22/25]
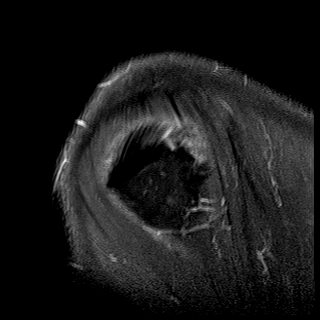

[20 of 40 positions shown; findings below may reference images not displayed]

FINDINGS: Rotator cuff: Partial thickness articular surface tearing of the
distal supraspinatus tendon for example on images 11-12 of series 9
with adjacent moderate supraspinatus tendinopathy. There is moderate
infraspinatus tendinopathy.

Muscles: Marked fatty replacement of the teres minor muscle, likely
the result of prior quadrilateral space syndrome. Low-level edema
tracks along the myotendinous junction of the infraspinatus muscle.

Biceps long head: Mild to moderate tendinopathy of the
intra-articular segment.

Acromioclavicular Joint: Considerable AC joint spurring with small
amount of fluid in the joint as well as subcortical marrow edema.
Type I acromion. Trace subacromial subdeltoid bursitis

Glenohumeral Joint: Mild degenerative chondral thinning. Mild
spurring of the humeral head and glenoid rim. Upper normal amount of
fluid in the glenohumeral joint and biceps recess potentially with
mild associated synovitis.

Labrum: Accentuated signal along the posterior and posterosuperior
labrum favoring labral degeneration but without a well-defined tear.

Bones: No significant extra-articular osseous abnormalities
identified.

Other: No supplemental non-categorized findings.
IMPRESSION: 1. Partial thickness articular surface tearing of the distal
supraspinatus tendon with adjacent moderate supraspinatus
tendinopathy.
2. Moderate infraspinatus tendinopathy.
3. Mild to moderate tendinopathy of the intra-articular segment of
the long head of the biceps.
4. Marked fatty replacement of the teres minor muscle, likely the
result of prior quadrilateral space syndrome.
5. Mild degenerative chondral thinning in the glenohumeral joint
with mild spurring of the humeral head and glenoid rim. Moderate
degenerative AC joint arthropathy.
6. Upper normal amount of fluid in the glenohumeral joint and biceps
recess potentially with mild associated synovitis.
7. Trace subacromial subdeltoid bursitis.

## 2020-04-29 DIAGNOSIS — M7661 Achilles tendinitis, right leg: Secondary | ICD-10-CM | POA: Diagnosis not present

## 2020-08-23 DIAGNOSIS — M654 Radial styloid tenosynovitis [de Quervain]: Secondary | ICD-10-CM | POA: Diagnosis not present

## 2020-08-23 DIAGNOSIS — M25532 Pain in left wrist: Secondary | ICD-10-CM | POA: Diagnosis not present

## 2020-09-07 DIAGNOSIS — M6281 Muscle weakness (generalized): Secondary | ICD-10-CM | POA: Diagnosis not present

## 2020-09-07 DIAGNOSIS — M6289 Other specified disorders of muscle: Secondary | ICD-10-CM | POA: Diagnosis not present

## 2020-09-13 DIAGNOSIS — M6281 Muscle weakness (generalized): Secondary | ICD-10-CM | POA: Diagnosis not present

## 2020-09-13 DIAGNOSIS — M6289 Other specified disorders of muscle: Secondary | ICD-10-CM | POA: Diagnosis not present

## 2020-09-20 DIAGNOSIS — M6289 Other specified disorders of muscle: Secondary | ICD-10-CM | POA: Diagnosis not present

## 2020-09-20 DIAGNOSIS — M6281 Muscle weakness (generalized): Secondary | ICD-10-CM | POA: Diagnosis not present

## 2020-10-06 DIAGNOSIS — M25531 Pain in right wrist: Secondary | ICD-10-CM | POA: Diagnosis not present

## 2020-10-06 DIAGNOSIS — M67911 Unspecified disorder of synovium and tendon, right shoulder: Secondary | ICD-10-CM | POA: Diagnosis not present

## 2020-10-17 DIAGNOSIS — M5136 Other intervertebral disc degeneration, lumbar region: Secondary | ICD-10-CM | POA: Diagnosis not present

## 2020-10-17 DIAGNOSIS — M5032 Other cervical disc degeneration, mid-cervical region, unspecified level: Secondary | ICD-10-CM | POA: Diagnosis not present

## 2020-10-17 DIAGNOSIS — M9901 Segmental and somatic dysfunction of cervical region: Secondary | ICD-10-CM | POA: Diagnosis not present

## 2020-10-17 DIAGNOSIS — M5134 Other intervertebral disc degeneration, thoracic region: Secondary | ICD-10-CM | POA: Diagnosis not present

## 2020-10-17 DIAGNOSIS — M9903 Segmental and somatic dysfunction of lumbar region: Secondary | ICD-10-CM | POA: Diagnosis not present

## 2020-10-17 DIAGNOSIS — M9902 Segmental and somatic dysfunction of thoracic region: Secondary | ICD-10-CM | POA: Diagnosis not present

## 2020-10-18 DIAGNOSIS — M6281 Muscle weakness (generalized): Secondary | ICD-10-CM | POA: Diagnosis not present

## 2020-10-18 DIAGNOSIS — M6289 Other specified disorders of muscle: Secondary | ICD-10-CM | POA: Diagnosis not present

## 2020-10-19 DIAGNOSIS — M5134 Other intervertebral disc degeneration, thoracic region: Secondary | ICD-10-CM | POA: Diagnosis not present

## 2020-10-19 DIAGNOSIS — M9903 Segmental and somatic dysfunction of lumbar region: Secondary | ICD-10-CM | POA: Diagnosis not present

## 2020-10-19 DIAGNOSIS — M9901 Segmental and somatic dysfunction of cervical region: Secondary | ICD-10-CM | POA: Diagnosis not present

## 2020-10-19 DIAGNOSIS — M5136 Other intervertebral disc degeneration, lumbar region: Secondary | ICD-10-CM | POA: Diagnosis not present

## 2020-10-19 DIAGNOSIS — M5032 Other cervical disc degeneration, mid-cervical region, unspecified level: Secondary | ICD-10-CM | POA: Diagnosis not present

## 2020-10-19 DIAGNOSIS — M9902 Segmental and somatic dysfunction of thoracic region: Secondary | ICD-10-CM | POA: Diagnosis not present

## 2020-10-20 DIAGNOSIS — M6281 Muscle weakness (generalized): Secondary | ICD-10-CM | POA: Diagnosis not present

## 2020-10-20 DIAGNOSIS — M6289 Other specified disorders of muscle: Secondary | ICD-10-CM | POA: Diagnosis not present

## 2020-10-24 DIAGNOSIS — M5032 Other cervical disc degeneration, mid-cervical region, unspecified level: Secondary | ICD-10-CM | POA: Diagnosis not present

## 2020-10-24 DIAGNOSIS — M9901 Segmental and somatic dysfunction of cervical region: Secondary | ICD-10-CM | POA: Diagnosis not present

## 2020-10-24 DIAGNOSIS — M5136 Other intervertebral disc degeneration, lumbar region: Secondary | ICD-10-CM | POA: Diagnosis not present

## 2020-10-24 DIAGNOSIS — M9903 Segmental and somatic dysfunction of lumbar region: Secondary | ICD-10-CM | POA: Diagnosis not present

## 2020-10-24 DIAGNOSIS — M9902 Segmental and somatic dysfunction of thoracic region: Secondary | ICD-10-CM | POA: Diagnosis not present

## 2020-10-24 DIAGNOSIS — M5134 Other intervertebral disc degeneration, thoracic region: Secondary | ICD-10-CM | POA: Diagnosis not present

## 2020-10-26 DIAGNOSIS — M5136 Other intervertebral disc degeneration, lumbar region: Secondary | ICD-10-CM | POA: Diagnosis not present

## 2020-10-26 DIAGNOSIS — M5134 Other intervertebral disc degeneration, thoracic region: Secondary | ICD-10-CM | POA: Diagnosis not present

## 2020-10-26 DIAGNOSIS — M5032 Other cervical disc degeneration, mid-cervical region, unspecified level: Secondary | ICD-10-CM | POA: Diagnosis not present

## 2020-10-26 DIAGNOSIS — M9901 Segmental and somatic dysfunction of cervical region: Secondary | ICD-10-CM | POA: Diagnosis not present

## 2020-10-26 DIAGNOSIS — M9902 Segmental and somatic dysfunction of thoracic region: Secondary | ICD-10-CM | POA: Diagnosis not present

## 2020-10-26 DIAGNOSIS — M9903 Segmental and somatic dysfunction of lumbar region: Secondary | ICD-10-CM | POA: Diagnosis not present

## 2020-10-27 DIAGNOSIS — M6289 Other specified disorders of muscle: Secondary | ICD-10-CM | POA: Diagnosis not present

## 2020-10-27 DIAGNOSIS — M6281 Muscle weakness (generalized): Secondary | ICD-10-CM | POA: Diagnosis not present

## 2020-10-31 DIAGNOSIS — M9902 Segmental and somatic dysfunction of thoracic region: Secondary | ICD-10-CM | POA: Diagnosis not present

## 2020-10-31 DIAGNOSIS — M5136 Other intervertebral disc degeneration, lumbar region: Secondary | ICD-10-CM | POA: Diagnosis not present

## 2020-10-31 DIAGNOSIS — M9901 Segmental and somatic dysfunction of cervical region: Secondary | ICD-10-CM | POA: Diagnosis not present

## 2020-10-31 DIAGNOSIS — M9903 Segmental and somatic dysfunction of lumbar region: Secondary | ICD-10-CM | POA: Diagnosis not present

## 2020-10-31 DIAGNOSIS — M5134 Other intervertebral disc degeneration, thoracic region: Secondary | ICD-10-CM | POA: Diagnosis not present

## 2020-10-31 DIAGNOSIS — M5032 Other cervical disc degeneration, mid-cervical region, unspecified level: Secondary | ICD-10-CM | POA: Diagnosis not present

## 2020-11-02 DIAGNOSIS — M5032 Other cervical disc degeneration, mid-cervical region, unspecified level: Secondary | ICD-10-CM | POA: Diagnosis not present

## 2020-11-02 DIAGNOSIS — M9902 Segmental and somatic dysfunction of thoracic region: Secondary | ICD-10-CM | POA: Diagnosis not present

## 2020-11-02 DIAGNOSIS — M5136 Other intervertebral disc degeneration, lumbar region: Secondary | ICD-10-CM | POA: Diagnosis not present

## 2020-11-02 DIAGNOSIS — M9901 Segmental and somatic dysfunction of cervical region: Secondary | ICD-10-CM | POA: Diagnosis not present

## 2020-11-02 DIAGNOSIS — M9903 Segmental and somatic dysfunction of lumbar region: Secondary | ICD-10-CM | POA: Diagnosis not present

## 2020-11-02 DIAGNOSIS — M5134 Other intervertebral disc degeneration, thoracic region: Secondary | ICD-10-CM | POA: Diagnosis not present

## 2020-11-04 DIAGNOSIS — M6281 Muscle weakness (generalized): Secondary | ICD-10-CM | POA: Diagnosis not present

## 2020-11-04 DIAGNOSIS — M6289 Other specified disorders of muscle: Secondary | ICD-10-CM | POA: Diagnosis not present

## 2020-11-07 DIAGNOSIS — M5134 Other intervertebral disc degeneration, thoracic region: Secondary | ICD-10-CM | POA: Diagnosis not present

## 2020-11-07 DIAGNOSIS — M9902 Segmental and somatic dysfunction of thoracic region: Secondary | ICD-10-CM | POA: Diagnosis not present

## 2020-11-07 DIAGNOSIS — M5032 Other cervical disc degeneration, mid-cervical region, unspecified level: Secondary | ICD-10-CM | POA: Diagnosis not present

## 2020-11-07 DIAGNOSIS — M9903 Segmental and somatic dysfunction of lumbar region: Secondary | ICD-10-CM | POA: Diagnosis not present

## 2020-11-07 DIAGNOSIS — M5136 Other intervertebral disc degeneration, lumbar region: Secondary | ICD-10-CM | POA: Diagnosis not present

## 2020-11-07 DIAGNOSIS — M9901 Segmental and somatic dysfunction of cervical region: Secondary | ICD-10-CM | POA: Diagnosis not present

## 2020-11-08 DIAGNOSIS — M9903 Segmental and somatic dysfunction of lumbar region: Secondary | ICD-10-CM | POA: Diagnosis not present

## 2020-11-08 DIAGNOSIS — M5136 Other intervertebral disc degeneration, lumbar region: Secondary | ICD-10-CM | POA: Diagnosis not present

## 2020-11-08 DIAGNOSIS — M5032 Other cervical disc degeneration, mid-cervical region, unspecified level: Secondary | ICD-10-CM | POA: Diagnosis not present

## 2020-11-08 DIAGNOSIS — M9902 Segmental and somatic dysfunction of thoracic region: Secondary | ICD-10-CM | POA: Diagnosis not present

## 2020-11-08 DIAGNOSIS — M9901 Segmental and somatic dysfunction of cervical region: Secondary | ICD-10-CM | POA: Diagnosis not present

## 2020-11-08 DIAGNOSIS — M5134 Other intervertebral disc degeneration, thoracic region: Secondary | ICD-10-CM | POA: Diagnosis not present

## 2020-11-15 DIAGNOSIS — M6281 Muscle weakness (generalized): Secondary | ICD-10-CM | POA: Diagnosis not present

## 2020-11-15 DIAGNOSIS — M6289 Other specified disorders of muscle: Secondary | ICD-10-CM | POA: Diagnosis not present

## 2020-11-16 DIAGNOSIS — M5136 Other intervertebral disc degeneration, lumbar region: Secondary | ICD-10-CM | POA: Diagnosis not present

## 2020-11-16 DIAGNOSIS — M9902 Segmental and somatic dysfunction of thoracic region: Secondary | ICD-10-CM | POA: Diagnosis not present

## 2020-11-16 DIAGNOSIS — M9903 Segmental and somatic dysfunction of lumbar region: Secondary | ICD-10-CM | POA: Diagnosis not present

## 2020-11-16 DIAGNOSIS — M5032 Other cervical disc degeneration, mid-cervical region, unspecified level: Secondary | ICD-10-CM | POA: Diagnosis not present

## 2020-11-16 DIAGNOSIS — M5134 Other intervertebral disc degeneration, thoracic region: Secondary | ICD-10-CM | POA: Diagnosis not present

## 2020-11-16 DIAGNOSIS — M9901 Segmental and somatic dysfunction of cervical region: Secondary | ICD-10-CM | POA: Diagnosis not present

## 2020-11-21 DIAGNOSIS — M9902 Segmental and somatic dysfunction of thoracic region: Secondary | ICD-10-CM | POA: Diagnosis not present

## 2020-11-21 DIAGNOSIS — M9901 Segmental and somatic dysfunction of cervical region: Secondary | ICD-10-CM | POA: Diagnosis not present

## 2020-11-21 DIAGNOSIS — M9903 Segmental and somatic dysfunction of lumbar region: Secondary | ICD-10-CM | POA: Diagnosis not present

## 2020-11-21 DIAGNOSIS — M5136 Other intervertebral disc degeneration, lumbar region: Secondary | ICD-10-CM | POA: Diagnosis not present

## 2020-11-21 DIAGNOSIS — M5032 Other cervical disc degeneration, mid-cervical region, unspecified level: Secondary | ICD-10-CM | POA: Diagnosis not present

## 2020-11-21 DIAGNOSIS — M5134 Other intervertebral disc degeneration, thoracic region: Secondary | ICD-10-CM | POA: Diagnosis not present

## 2020-11-22 DIAGNOSIS — M6281 Muscle weakness (generalized): Secondary | ICD-10-CM | POA: Diagnosis not present

## 2020-11-22 DIAGNOSIS — M6289 Other specified disorders of muscle: Secondary | ICD-10-CM | POA: Diagnosis not present

## 2020-11-29 DIAGNOSIS — M6281 Muscle weakness (generalized): Secondary | ICD-10-CM | POA: Diagnosis not present

## 2020-11-29 DIAGNOSIS — M6289 Other specified disorders of muscle: Secondary | ICD-10-CM | POA: Diagnosis not present

## 2020-12-05 DIAGNOSIS — M9902 Segmental and somatic dysfunction of thoracic region: Secondary | ICD-10-CM | POA: Diagnosis not present

## 2020-12-05 DIAGNOSIS — M9903 Segmental and somatic dysfunction of lumbar region: Secondary | ICD-10-CM | POA: Diagnosis not present

## 2020-12-05 DIAGNOSIS — M5134 Other intervertebral disc degeneration, thoracic region: Secondary | ICD-10-CM | POA: Diagnosis not present

## 2020-12-05 DIAGNOSIS — M5136 Other intervertebral disc degeneration, lumbar region: Secondary | ICD-10-CM | POA: Diagnosis not present

## 2020-12-05 DIAGNOSIS — M9901 Segmental and somatic dysfunction of cervical region: Secondary | ICD-10-CM | POA: Diagnosis not present

## 2020-12-05 DIAGNOSIS — M5032 Other cervical disc degeneration, mid-cervical region, unspecified level: Secondary | ICD-10-CM | POA: Diagnosis not present

## 2020-12-06 DIAGNOSIS — M6289 Other specified disorders of muscle: Secondary | ICD-10-CM | POA: Diagnosis not present

## 2020-12-06 DIAGNOSIS — M6281 Muscle weakness (generalized): Secondary | ICD-10-CM | POA: Diagnosis not present

## 2020-12-07 DIAGNOSIS — M9901 Segmental and somatic dysfunction of cervical region: Secondary | ICD-10-CM | POA: Diagnosis not present

## 2020-12-07 DIAGNOSIS — M5134 Other intervertebral disc degeneration, thoracic region: Secondary | ICD-10-CM | POA: Diagnosis not present

## 2020-12-07 DIAGNOSIS — M5032 Other cervical disc degeneration, mid-cervical region, unspecified level: Secondary | ICD-10-CM | POA: Diagnosis not present

## 2020-12-07 DIAGNOSIS — M5136 Other intervertebral disc degeneration, lumbar region: Secondary | ICD-10-CM | POA: Diagnosis not present

## 2020-12-07 DIAGNOSIS — M9902 Segmental and somatic dysfunction of thoracic region: Secondary | ICD-10-CM | POA: Diagnosis not present

## 2020-12-07 DIAGNOSIS — M9903 Segmental and somatic dysfunction of lumbar region: Secondary | ICD-10-CM | POA: Diagnosis not present

## 2020-12-08 DIAGNOSIS — M6289 Other specified disorders of muscle: Secondary | ICD-10-CM | POA: Diagnosis not present

## 2020-12-08 DIAGNOSIS — M6281 Muscle weakness (generalized): Secondary | ICD-10-CM | POA: Diagnosis not present

## 2020-12-09 DIAGNOSIS — M25532 Pain in left wrist: Secondary | ICD-10-CM | POA: Diagnosis not present

## 2020-12-09 DIAGNOSIS — M19012 Primary osteoarthritis, left shoulder: Secondary | ICD-10-CM | POA: Diagnosis not present

## 2020-12-09 DIAGNOSIS — M654 Radial styloid tenosynovitis [de Quervain]: Secondary | ICD-10-CM | POA: Diagnosis not present

## 2020-12-12 DIAGNOSIS — M5032 Other cervical disc degeneration, mid-cervical region, unspecified level: Secondary | ICD-10-CM | POA: Diagnosis not present

## 2020-12-12 DIAGNOSIS — M9902 Segmental and somatic dysfunction of thoracic region: Secondary | ICD-10-CM | POA: Diagnosis not present

## 2020-12-12 DIAGNOSIS — M9901 Segmental and somatic dysfunction of cervical region: Secondary | ICD-10-CM | POA: Diagnosis not present

## 2020-12-12 DIAGNOSIS — M9903 Segmental and somatic dysfunction of lumbar region: Secondary | ICD-10-CM | POA: Diagnosis not present

## 2020-12-12 DIAGNOSIS — M5136 Other intervertebral disc degeneration, lumbar region: Secondary | ICD-10-CM | POA: Diagnosis not present

## 2020-12-12 DIAGNOSIS — M5134 Other intervertebral disc degeneration, thoracic region: Secondary | ICD-10-CM | POA: Diagnosis not present

## 2020-12-14 DIAGNOSIS — M5032 Other cervical disc degeneration, mid-cervical region, unspecified level: Secondary | ICD-10-CM | POA: Diagnosis not present

## 2020-12-14 DIAGNOSIS — M5136 Other intervertebral disc degeneration, lumbar region: Secondary | ICD-10-CM | POA: Diagnosis not present

## 2020-12-14 DIAGNOSIS — M5134 Other intervertebral disc degeneration, thoracic region: Secondary | ICD-10-CM | POA: Diagnosis not present

## 2020-12-14 DIAGNOSIS — M9902 Segmental and somatic dysfunction of thoracic region: Secondary | ICD-10-CM | POA: Diagnosis not present

## 2020-12-14 DIAGNOSIS — M9903 Segmental and somatic dysfunction of lumbar region: Secondary | ICD-10-CM | POA: Diagnosis not present

## 2020-12-14 DIAGNOSIS — M9901 Segmental and somatic dysfunction of cervical region: Secondary | ICD-10-CM | POA: Diagnosis not present

## 2020-12-16 DIAGNOSIS — Z20822 Contact with and (suspected) exposure to covid-19: Secondary | ICD-10-CM | POA: Diagnosis not present

## 2020-12-28 DIAGNOSIS — M6289 Other specified disorders of muscle: Secondary | ICD-10-CM | POA: Diagnosis not present

## 2020-12-28 DIAGNOSIS — M6281 Muscle weakness (generalized): Secondary | ICD-10-CM | POA: Diagnosis not present

## 2021-01-03 DIAGNOSIS — M6289 Other specified disorders of muscle: Secondary | ICD-10-CM | POA: Diagnosis not present

## 2021-01-03 DIAGNOSIS — M6281 Muscle weakness (generalized): Secondary | ICD-10-CM | POA: Diagnosis not present

## 2021-01-05 DIAGNOSIS — M6289 Other specified disorders of muscle: Secondary | ICD-10-CM | POA: Diagnosis not present

## 2021-01-05 DIAGNOSIS — M6281 Muscle weakness (generalized): Secondary | ICD-10-CM | POA: Diagnosis not present

## 2021-01-10 DIAGNOSIS — M6289 Other specified disorders of muscle: Secondary | ICD-10-CM | POA: Diagnosis not present

## 2021-01-10 DIAGNOSIS — M6281 Muscle weakness (generalized): Secondary | ICD-10-CM | POA: Diagnosis not present

## 2021-01-11 DIAGNOSIS — M5032 Other cervical disc degeneration, mid-cervical region, unspecified level: Secondary | ICD-10-CM | POA: Diagnosis not present

## 2021-01-11 DIAGNOSIS — M5134 Other intervertebral disc degeneration, thoracic region: Secondary | ICD-10-CM | POA: Diagnosis not present

## 2021-01-11 DIAGNOSIS — M5136 Other intervertebral disc degeneration, lumbar region: Secondary | ICD-10-CM | POA: Diagnosis not present

## 2021-01-11 DIAGNOSIS — M9901 Segmental and somatic dysfunction of cervical region: Secondary | ICD-10-CM | POA: Diagnosis not present

## 2021-01-11 DIAGNOSIS — M9902 Segmental and somatic dysfunction of thoracic region: Secondary | ICD-10-CM | POA: Diagnosis not present

## 2021-01-11 DIAGNOSIS — M9903 Segmental and somatic dysfunction of lumbar region: Secondary | ICD-10-CM | POA: Diagnosis not present

## 2021-01-18 DIAGNOSIS — M9902 Segmental and somatic dysfunction of thoracic region: Secondary | ICD-10-CM | POA: Diagnosis not present

## 2021-01-18 DIAGNOSIS — M6281 Muscle weakness (generalized): Secondary | ICD-10-CM | POA: Diagnosis not present

## 2021-01-18 DIAGNOSIS — M5032 Other cervical disc degeneration, mid-cervical region, unspecified level: Secondary | ICD-10-CM | POA: Diagnosis not present

## 2021-01-18 DIAGNOSIS — M5134 Other intervertebral disc degeneration, thoracic region: Secondary | ICD-10-CM | POA: Diagnosis not present

## 2021-01-18 DIAGNOSIS — M5136 Other intervertebral disc degeneration, lumbar region: Secondary | ICD-10-CM | POA: Diagnosis not present

## 2021-01-18 DIAGNOSIS — M9903 Segmental and somatic dysfunction of lumbar region: Secondary | ICD-10-CM | POA: Diagnosis not present

## 2021-01-18 DIAGNOSIS — M6289 Other specified disorders of muscle: Secondary | ICD-10-CM | POA: Diagnosis not present

## 2021-01-18 DIAGNOSIS — M9901 Segmental and somatic dysfunction of cervical region: Secondary | ICD-10-CM | POA: Diagnosis not present

## 2021-01-25 DIAGNOSIS — M9903 Segmental and somatic dysfunction of lumbar region: Secondary | ICD-10-CM | POA: Diagnosis not present

## 2021-01-25 DIAGNOSIS — M5032 Other cervical disc degeneration, mid-cervical region, unspecified level: Secondary | ICD-10-CM | POA: Diagnosis not present

## 2021-01-25 DIAGNOSIS — M5136 Other intervertebral disc degeneration, lumbar region: Secondary | ICD-10-CM | POA: Diagnosis not present

## 2021-01-25 DIAGNOSIS — M5134 Other intervertebral disc degeneration, thoracic region: Secondary | ICD-10-CM | POA: Diagnosis not present

## 2021-01-25 DIAGNOSIS — M9902 Segmental and somatic dysfunction of thoracic region: Secondary | ICD-10-CM | POA: Diagnosis not present

## 2021-01-25 DIAGNOSIS — M9901 Segmental and somatic dysfunction of cervical region: Secondary | ICD-10-CM | POA: Diagnosis not present

## 2021-02-01 DIAGNOSIS — M9902 Segmental and somatic dysfunction of thoracic region: Secondary | ICD-10-CM | POA: Diagnosis not present

## 2021-02-01 DIAGNOSIS — M5032 Other cervical disc degeneration, mid-cervical region, unspecified level: Secondary | ICD-10-CM | POA: Diagnosis not present

## 2021-02-01 DIAGNOSIS — M5136 Other intervertebral disc degeneration, lumbar region: Secondary | ICD-10-CM | POA: Diagnosis not present

## 2021-02-01 DIAGNOSIS — M9901 Segmental and somatic dysfunction of cervical region: Secondary | ICD-10-CM | POA: Diagnosis not present

## 2021-02-01 DIAGNOSIS — M5134 Other intervertebral disc degeneration, thoracic region: Secondary | ICD-10-CM | POA: Diagnosis not present

## 2021-02-01 DIAGNOSIS — M9903 Segmental and somatic dysfunction of lumbar region: Secondary | ICD-10-CM | POA: Diagnosis not present

## 2021-02-03 DIAGNOSIS — M791 Myalgia, unspecified site: Secondary | ICD-10-CM | POA: Diagnosis not present

## 2021-02-03 DIAGNOSIS — M48061 Spinal stenosis, lumbar region without neurogenic claudication: Secondary | ICD-10-CM | POA: Diagnosis not present

## 2021-02-08 DIAGNOSIS — M5136 Other intervertebral disc degeneration, lumbar region: Secondary | ICD-10-CM | POA: Diagnosis not present

## 2021-02-08 DIAGNOSIS — M5032 Other cervical disc degeneration, mid-cervical region, unspecified level: Secondary | ICD-10-CM | POA: Diagnosis not present

## 2021-02-08 DIAGNOSIS — M9901 Segmental and somatic dysfunction of cervical region: Secondary | ICD-10-CM | POA: Diagnosis not present

## 2021-02-08 DIAGNOSIS — M9902 Segmental and somatic dysfunction of thoracic region: Secondary | ICD-10-CM | POA: Diagnosis not present

## 2021-02-08 DIAGNOSIS — M5134 Other intervertebral disc degeneration, thoracic region: Secondary | ICD-10-CM | POA: Diagnosis not present

## 2021-02-08 DIAGNOSIS — M9903 Segmental and somatic dysfunction of lumbar region: Secondary | ICD-10-CM | POA: Diagnosis not present

## 2021-02-15 DIAGNOSIS — M9903 Segmental and somatic dysfunction of lumbar region: Secondary | ICD-10-CM | POA: Diagnosis not present

## 2021-02-15 DIAGNOSIS — M9901 Segmental and somatic dysfunction of cervical region: Secondary | ICD-10-CM | POA: Diagnosis not present

## 2021-02-15 DIAGNOSIS — M5134 Other intervertebral disc degeneration, thoracic region: Secondary | ICD-10-CM | POA: Diagnosis not present

## 2021-02-15 DIAGNOSIS — M5032 Other cervical disc degeneration, mid-cervical region, unspecified level: Secondary | ICD-10-CM | POA: Diagnosis not present

## 2021-02-15 DIAGNOSIS — M9902 Segmental and somatic dysfunction of thoracic region: Secondary | ICD-10-CM | POA: Diagnosis not present

## 2021-02-15 DIAGNOSIS — M5136 Other intervertebral disc degeneration, lumbar region: Secondary | ICD-10-CM | POA: Diagnosis not present

## 2021-02-20 DIAGNOSIS — Z96641 Presence of right artificial hip joint: Secondary | ICD-10-CM | POA: Diagnosis not present

## 2021-02-20 DIAGNOSIS — Z471 Aftercare following joint replacement surgery: Secondary | ICD-10-CM | POA: Diagnosis not present

## 2021-02-20 DIAGNOSIS — Z96642 Presence of left artificial hip joint: Secondary | ICD-10-CM | POA: Diagnosis not present

## 2021-03-07 DIAGNOSIS — J209 Acute bronchitis, unspecified: Secondary | ICD-10-CM | POA: Diagnosis not present

## 2021-03-10 ENCOUNTER — Emergency Department (HOSPITAL_COMMUNITY): Payer: Medicare Other

## 2021-03-10 ENCOUNTER — Encounter (HOSPITAL_COMMUNITY): Payer: Self-pay | Admitting: *Deleted

## 2021-03-10 ENCOUNTER — Ambulatory Visit (HOSPITAL_COMMUNITY)
Admission: EM | Admit: 2021-03-10 | Discharge: 2021-03-10 | Disposition: A | Discharge status: Home / Self Care | Payer: Medicare Other

## 2021-03-10 ENCOUNTER — Other Ambulatory Visit: Payer: Self-pay

## 2021-03-10 ENCOUNTER — Encounter (HOSPITAL_COMMUNITY): Payer: Self-pay | Admitting: Emergency Medicine

## 2021-03-10 ENCOUNTER — Observation Stay (HOSPITAL_COMMUNITY): Payer: Medicare Other

## 2021-03-10 ENCOUNTER — Observation Stay (HOSPITAL_COMMUNITY)
Admission: EM | Admit: 2021-03-10 | Discharge: 2021-03-11 | Disposition: A | Discharge status: Home / Self Care | Payer: Medicare Other | Attending: Internal Medicine | Admitting: Internal Medicine

## 2021-03-10 DIAGNOSIS — Z79899 Other long term (current) drug therapy: Secondary | ICD-10-CM | POA: Insufficient documentation

## 2021-03-10 DIAGNOSIS — J45909 Unspecified asthma, uncomplicated: Secondary | ICD-10-CM | POA: Insufficient documentation

## 2021-03-10 DIAGNOSIS — E785 Hyperlipidemia, unspecified: Secondary | ICD-10-CM | POA: Diagnosis present

## 2021-03-10 DIAGNOSIS — Z96643 Presence of artificial hip joint, bilateral: Secondary | ICD-10-CM | POA: Insufficient documentation

## 2021-03-10 DIAGNOSIS — S199XXA Unspecified injury of neck, initial encounter: Secondary | ICD-10-CM | POA: Diagnosis not present

## 2021-03-10 DIAGNOSIS — M545 Low back pain, unspecified: Secondary | ICD-10-CM | POA: Diagnosis not present

## 2021-03-10 DIAGNOSIS — S0990XA Unspecified injury of head, initial encounter: Secondary | ICD-10-CM | POA: Diagnosis not present

## 2021-03-10 DIAGNOSIS — R109 Unspecified abdominal pain: Secondary | ICD-10-CM | POA: Diagnosis not present

## 2021-03-10 DIAGNOSIS — Z20822 Contact with and (suspected) exposure to covid-19: Secondary | ICD-10-CM | POA: Diagnosis not present

## 2021-03-10 DIAGNOSIS — E039 Hypothyroidism, unspecified: Secondary | ICD-10-CM | POA: Diagnosis not present

## 2021-03-10 DIAGNOSIS — M542 Cervicalgia: Secondary | ICD-10-CM | POA: Diagnosis not present

## 2021-03-10 DIAGNOSIS — S3991XA Unspecified injury of abdomen, initial encounter: Secondary | ICD-10-CM | POA: Diagnosis not present

## 2021-03-10 DIAGNOSIS — K4020 Bilateral inguinal hernia, without obstruction or gangrene, not specified as recurrent: Secondary | ICD-10-CM | POA: Diagnosis not present

## 2021-03-10 DIAGNOSIS — Z87891 Personal history of nicotine dependence: Secondary | ICD-10-CM | POA: Insufficient documentation

## 2021-03-10 DIAGNOSIS — R55 Syncope and collapse: Principal | ICD-10-CM | POA: Insufficient documentation

## 2021-03-10 DIAGNOSIS — M47812 Spondylosis without myelopathy or radiculopathy, cervical region: Secondary | ICD-10-CM | POA: Diagnosis not present

## 2021-03-10 DIAGNOSIS — Z96653 Presence of artificial knee joint, bilateral: Secondary | ICD-10-CM | POA: Insufficient documentation

## 2021-03-10 DIAGNOSIS — I498 Other specified cardiac arrhythmias: Secondary | ICD-10-CM

## 2021-03-10 DIAGNOSIS — N200 Calculus of kidney: Secondary | ICD-10-CM | POA: Diagnosis not present

## 2021-03-10 DIAGNOSIS — M2578 Osteophyte, vertebrae: Secondary | ICD-10-CM | POA: Diagnosis not present

## 2021-03-10 DIAGNOSIS — H0520 Unspecified exophthalmos: Secondary | ICD-10-CM | POA: Diagnosis not present

## 2021-03-10 DIAGNOSIS — M546 Pain in thoracic spine: Secondary | ICD-10-CM

## 2021-03-10 DIAGNOSIS — G8929 Other chronic pain: Secondary | ICD-10-CM | POA: Diagnosis present

## 2021-03-10 DIAGNOSIS — E119 Type 2 diabetes mellitus without complications: Secondary | ICD-10-CM | POA: Insufficient documentation

## 2021-03-10 DIAGNOSIS — M549 Dorsalgia, unspecified: Secondary | ICD-10-CM | POA: Diagnosis present

## 2021-03-10 LAB — BASIC METABOLIC PANEL
Anion gap: 11 (ref 5–15)
BUN: 17 mg/dL (ref 8–23)
CO2: 26 mmol/L (ref 22–32)
Calcium: 9.9 mg/dL (ref 8.9–10.3)
Chloride: 100 mmol/L (ref 98–111)
Creatinine, Ser: 1.11 mg/dL (ref 0.61–1.24)
GFR, Estimated: >60 mL/min (ref >60)
Glucose, Bld: 158 mg/dL — ABNORMAL HIGH (ref 70–99)
Potassium: 4.1 mmol/L (ref 3.5–5.1)
Sodium: 137 mmol/L (ref 135–145)

## 2021-03-10 LAB — CBC WITH DIFFERENTIAL/PLATELET
Abs Immature Granulocytes: 0.04 10*3/uL (ref 0.00–0.07)
Basophils Absolute: 0 10*3/uL (ref 0.0–0.1)
Basophils Relative: 0 %
Eosinophils Absolute: 0 10*3/uL (ref 0.0–0.5)
Eosinophils Relative: 0 %
HCT: 42.7 % (ref 39.0–52.0)
Hemoglobin: 14.4 g/dL (ref 13.0–17.0)
Immature Granulocytes: 0 %
Lymphocytes Relative: 16 %
Lymphs Abs: 1.6 10*3/uL (ref 0.7–4.0)
MCH: 31.4 pg (ref 26.0–34.0)
MCHC: 33.7 g/dL (ref 30.0–36.0)
MCV: 93.2 fL (ref 80.0–100.0)
Monocytes Absolute: 0.8 10*3/uL (ref 0.1–1.0)
Monocytes Relative: 8 %
Neutro Abs: 7.4 10*3/uL (ref 1.7–7.7)
Neutrophils Relative %: 76 %
Platelets: 161 10*3/uL (ref 150–400)
RBC: 4.58 MIL/uL (ref 4.22–5.81)
RDW: 13 % (ref 11.5–15.5)
WBC: 9.7 10*3/uL (ref 4.0–10.5)
nRBC: 0 % (ref 0.0–0.2)

## 2021-03-10 LAB — RESP PANEL BY RT-PCR (FLU A&B, COVID) ARPGX2
Influenza A by PCR: NEGATIVE
Influenza B by PCR: NEGATIVE
SARS Coronavirus 2 by RT PCR: NEGATIVE

## 2021-03-10 LAB — TROPONIN I (HIGH SENSITIVITY): Troponin I (High Sensitivity): 5 ng/L (ref <18)

## 2021-03-10 MED ORDER — PANTOPRAZOLE SODIUM 40 MG PO TBEC
40.0000 mg | DELAYED_RELEASE_TABLET | Freq: Every day | ORAL | Status: DC
  Administered 2021-03-11: 40 mg via ORAL
  Filled 2021-03-10: qty 1

## 2021-03-10 MED ORDER — ACETAMINOPHEN 650 MG RE SUPP: 650.0000 mg | Freq: Four times a day (QID) | RECTAL | Status: DC | PRN

## 2021-03-10 MED ORDER — UMECLIDINIUM BROMIDE 62.5 MCG/ACT IN AEPB
1.0000 | INHALATION_SPRAY | Freq: Every day | RESPIRATORY_TRACT | Status: DC
  Administered 2021-03-11: 1 via RESPIRATORY_TRACT
  Filled 2021-03-10: qty 7

## 2021-03-10 MED ORDER — ROSUVASTATIN CALCIUM 20 MG PO TABS
20.0000 mg | ORAL_TABLET | Freq: Every day | ORAL | Status: DC
  Administered 2021-03-11: 20 mg via ORAL
  Filled 2021-03-10: qty 1

## 2021-03-10 MED ORDER — IBUPROFEN 400 MG PO TABS
400.0000 mg | ORAL_TABLET | Freq: Once | ORAL | Status: AC
  Administered 2021-03-10: 400 mg via ORAL
  Filled 2021-03-10: qty 1

## 2021-03-10 MED ORDER — ACETAMINOPHEN 325 MG PO TABS
650.0000 mg | ORAL_TABLET | Freq: Four times a day (QID) | ORAL | Status: DC | PRN
  Administered 2021-03-11: 650 mg via ORAL
  Filled 2021-03-10: qty 2

## 2021-03-10 MED ORDER — ACETAMINOPHEN 325 MG PO TABS
650.0000 mg | ORAL_TABLET | Freq: Once | ORAL | Status: AC
  Administered 2021-03-10: 650 mg via ORAL
  Filled 2021-03-10: qty 2

## 2021-03-10 MED ORDER — ENOXAPARIN SODIUM 40 MG/0.4ML IJ SOSY
40.0000 mg | PREFILLED_SYRINGE | Freq: Every day | INTRAMUSCULAR | Status: DC
  Administered 2021-03-11: 40 mg via SUBCUTANEOUS
  Filled 2021-03-10: qty 0.4

## 2021-03-10 MED ORDER — ALLOPURINOL 300 MG PO TABS
300.0000 mg | ORAL_TABLET | Freq: Every day | ORAL | Status: DC
  Administered 2021-03-11: 300 mg via ORAL
  Filled 2021-03-10: qty 3

## 2021-03-10 MED ORDER — LEVOTHYROXINE SODIUM 50 MCG PO TABS
50.0000 ug | ORAL_TABLET | Freq: Every day | ORAL | Status: DC
  Administered 2021-03-11: 50 ug via ORAL
  Filled 2021-03-10: qty 1

## 2021-03-10 MED ORDER — MOMETASONE FURO-FORMOTEROL FUM 200-5 MCG/ACT IN AERO
2.0000 | INHALATION_SPRAY | Freq: Two times a day (BID) | RESPIRATORY_TRACT | Status: DC
  Administered 2021-03-11: 2 via RESPIRATORY_TRACT
  Filled 2021-03-10: qty 8.8

## 2021-03-10 NOTE — ED Provider Notes (Signed)
Emergency Medicine Provider Triage Evaluation Note  Kyle Delacruz , a 65 y.o. male  was evaluated in triage.  Pt complains of syncope. States on Wednesday he was standing in the kitchen doing dishes and woke up on the floor. Endorses hitting his head on the floor and having some initial headache which has subsided. Denies any pre-syncopal symptoms such as chest pain, shortness of breath, headache, auras, tunnel vision, diaphoresis, lightheadedness or dizziness. States he was recently started on prednisone and Zpack day prior, and had consumed one alcoholic beverage prior to incident. Denies any symptoms since. States he does have some back stiffness since.   Review of Systems  Positive: Syncope, back pain Negative: Headache, neck pain, dizziness, lightheadedness, chest pain, shortness of breath   Physical Exam  BP (!) 131/91 (BP Location: Right Arm)   Pulse 80   Temp 98.4 F (36.9 C) (Oral)   Resp 16   SpO2 98%  Gen:   Awake, no distress   Resp:  Normal effort, CTA bilaterally  MSK:   Moves extremities without difficulty  Other:  S1/S2 without murmurs, rubs, gallops. Pulses 2+ and equal. No focal neurological deficits. No head trauma noted on exam No midline spinal tenderness  TTP of L-spine paraspinal muscles without obvious injury.   Medical Decision Making  Medically screening exam initiated at 9:37 AM.  Appropriate orders placed.  [name] [facility] was informed that the remainder of the evaluation will be completed by another provider, this initial triage assessment does not replace that evaluation, and the importance of remaining in the ED until their evaluation is complete.     Mickie Hillier, PA-C 03/10/21 1594    Regan Lemming, MD 03/10/21 1048

## 2021-03-10 NOTE — ED Notes (Signed)
Stepdaughter Kyle Delacruz [phone] would like an update immediately

## 2021-03-10 NOTE — H&P (Addendum)
And possib  History and Physical    Kyle Delacruz KKX:[id number] DOB: 06-24-1955 DOA: 03/10/2021  PCP: Prince Solian, MD  Patient coming from: Home.  Chief Complaint: Loss of consciousness and mid back pain.  HPI: Kyle Delacruz is a 65 y.o. male with history of hypothyroidism, hyperlipidemia, alcohol use with possible bronchitis was recently started on Z-Pak and prednisone for acute bronchitis had a syncopal episode about 3 days ago when patient was at his home.  Patient states he had lunch and was standing in his kitchen when he suddenly lost consciousness and fell onto the floor hit his head and regained consciousness.  He did not have any prodrome.  Denies of any chest pain or palpitations or shortness of breath prior to the episode or after.  Since the fall patient has been having mid back pain.  The pain has been worsening.  Has no associated incontinence of urine or bowel.  The pain does not radiate to the legs.  ED Course: In the ER EKG shows normal sinus rhythm with premature atrial complexes.  QTC of 429 ms.  High sensitive troponins were negative.  CT head was unremarkable.  CT spine shows severe spinal canal stenosis with impingement of the spinal cord.  CT abdomen pelvis was done which was unremarkable.  CBC and metabolic panel unremarkable COVID test was negative patient admitted for further management of syncope and mid back pain.  Review of Systems: As per HPI, rest all negative.   Past Medical History:  Diagnosis Date   Allergy    Arthritis    osteo   CTS (carpal tunnel syndrome)    DJD (degenerative joint disease)    DM2 (diabetes mellitus, type 2) (HCC)    GERD (gastroesophageal reflux disease)    Gout    History of adenomatous polyps of colon 12/29/2019   History of nuclear stress test    Myoview 11/17: EF 54, no ST changes, hypertensive blood pressure response, no ischemia, low risk   Hyperlipidemia    Hypothyroidism    Nicotine addiction    OSA (obstructive  sleep apnea)    Prostatitis    Reactive airway disease    Sarcocystosis    Sinus arrhythmia    Sleep apnea    cpap- not wearing   Tinea corporis     Past Surgical History:  Procedure Laterality Date   BILATERAL KNEE ARTHROSCOPY     right- 1997; left 2001   COLONOSCOPY  multiple last 2013   KNEE SURGERY Right 2014   torn R medial meniscus- arthroscopy done   PARATHYROIDECTOMY  2005   ROTATOR CUFF REPAIR  2004   right   TONSILLECTOMY     TOTAL HIP ARTHROPLASTY Left 2009   TOTAL HIP ARTHROPLASTY Right 2006   UPPER GASTROINTESTINAL ENDOSCOPY       reports that he has quit smoking. His smoking use included cigarettes. He smoked an average of .5 packs per day. He has never used smokeless tobacco. He reports current alcohol use of about 21.0 standard drinks per week. He reports that he does not use drugs.  Allergies  Allergen Reactions   Iohexol      Code: RASH, Desc: rash 2 years ago, needs 13 hr prep for future exams    Penicillins Hives   Shellfish Allergy Swelling    Family History  Problem Relation Age of Onset   Stroke Mother    Diabetes Father    Heart disease Father  sp CABG   Diabetes Paternal Grandmother    Colon cancer Neg Hx    Stomach cancer Neg Hx    Esophageal cancer Neg Hx    Rectal cancer Neg Hx    Prostate cancer Neg Hx     Prior to Admission medications   Medication Sig Start Date End Date Taking? Authorizing Provider  albuterol (VENTOLIN HFA) 108 (90 Base) MCG/ACT inhaler Inhale 2 puffs into the lungs every 4 (four) hours. 03/07/21  Yes [provider]  allopurinol (ZYLOPRIM) 300 MG tablet Take 300 mg by mouth daily.  06/30/11  Yes [provider]  azithromycin (ZITHROMAX) 250 MG tablet Take 250-500 mg by mouth as directed. 03/07/21  Yes [provider]  CRESTOR 20 MG tablet Take 20 mg by mouth daily.  06/30/11  Yes [provider]  ergocalciferol (VITAMIN D2) 1.25 MG (50000 UT) capsule Take 50,000 Units by  mouth once a week. Monday 08/15/20  Yes [provider]  fluticasone (FLONASE) 50 MCG/ACT nasal spray Place 1 spray into the nose 2 (two) times daily as needed for allergies. 06/30/11  Yes [provider]  levothyroxine (SYNTHROID, LEVOTHROID) 50 MCG tablet Take 50 mcg by mouth daily. 02/18/15  Yes [provider]  methylPREDNISolone (MEDROL DOSEPAK) 4 MG TBPK tablet Take 4-20 mg by mouth See admin instructions. 03/07/21  Yes [provider]  naproxen sodium (ALEVE) 220 MG tablet Take 440 mg by mouth 2 (two) times daily as needed (pain).   Yes [provider]  omeprazole (PRILOSEC) 40 MG capsule Take 40 mg by mouth 2 (two) times daily. 01/31/21  Yes [provider]  SPIRIVA HANDIHALER 18 MCG inhalation capsule Place 1 capsule into inhaler and inhale daily. 01/30/21  Yes [provider]  Grant Ruts INHUB 250-50 MCG/ACT AEPB Inhale 1 puff into the lungs 2 (two) times daily. 01/30/21  Yes [provider]  chlorpheniramine-HYDROcodone (TUSSIONEX) 10-8 MG/5ML SUER Take 5 mLs by mouth every 12 (twelve) hours as needed for cough. 03/07/21   [provider]    Physical Exam: Constitutional: Moderately built and nourished. Vitals:   03/10/21 1800 03/10/21 1815 03/10/21 1850 03/10/21 2011  BP: (!) 135/94 (!) 132/96 (!) 157/113   Pulse: 76 78 93   Resp:   (!) 21   Temp:      TempSrc:      SpO2: 99% 99% 100% 100%   Eyes: Anicteric no pallor. ENMT: No discharge from the ears eyes nose and mouth. Neck: No mass felt.  No neck rigidity. Respiratory: No rhonchi or crepitations. Cardiovascular: S1-S2 heard. Abdomen: Soft nontender bowel sound present. Musculoskeletal: No edema.  Patient has pain on palpation of the mid back. Skin: No rash. Neurologic: Alert awake oriented to time place and person.  Moves all extremities 5 x 5. Psychiatric: Appears normal.  Normal affect.   Labs on Admission: I have personally reviewed following  labs and imaging studies  CBC: Recent Labs  Lab 03/10/21 0937  WBC 9.7  NEUTROABS 7.4  HGB 14.4  HCT 42.7  MCV 93.2  PLT 956   Basic Metabolic Panel: Recent Labs  Lab 03/10/21 0937  NA 137  K 4.1  CL 100  CO2 26  GLUCOSE 158*  BUN 17  CREATININE 1.11  CALCIUM 9.9   GFR: CrCl cannot be calculated (Unknown ideal weight.). Liver Function Tests: No results for input(s): AST, ALT, ALKPHOS, BILITOT, PROT, ALBUMIN in the last 168 hours. No results for input(s): LIPASE, AMYLASE in the last 168  hours. No results for input(s): AMMONIA in the last 168 hours. Coagulation Profile: No results for input(s): INR, PROTIME in the last 168 hours. Cardiac Enzymes: No results for input(s): CKTOTAL, CKMB, CKMBINDEX, TROPONINI in the last 168 hours. BNP (last 3 results) No results for input(s): PROBNP in the last 8760 hours. HbA1C: No results for input(s): HGBA1C in the last 72 hours. CBG: No results for input(s): GLUCAP in the last 168 hours. Lipid Profile: No results for input(s): CHOL, HDL, LDLCALC, TRIG, CHOLHDL, LDLDIRECT in the last 72 hours. Thyroid Function Tests: No results for input(s): TSH, T4TOTAL, FREET4, T3FREE, THYROIDAB in the last 72 hours. Anemia Panel: No results for input(s): VITAMINB12, FOLATE, FERRITIN, TIBC, IRON, RETICCTPCT in the last 72 hours. Urine analysis:    Component Value Date/Time   COLORURINE YELLOW 11/27/2007 0820   APPEARANCEUR CLEAR 11/27/2007 0820   LABSPEC >=1.030 04/26/2014 1212   PHURINE 5.5 04/26/2014 1212   GLUCOSEU NEGATIVE 04/26/2014 1212   HGBUR NEGATIVE 04/26/2014 1212   BILIRUBINUR NEGATIVE 04/26/2014 1212   KETONESUR NEGATIVE 04/26/2014 1212   PROTEINUR 100 (A) 04/26/2014 1212   UROBILINOGEN 0.2 04/26/2014 1212   NITRITE NEGATIVE 04/26/2014 1212   LEUKOCYTESUR NEGATIVE 04/26/2014 1212   Sepsis Labs: @LABRCNTIP (procalcitonin:4,lacticidven:4) ) Recent Results (from the past 240 hour(s))  Resp Panel by RT-PCR (Flu A&B, Covid)  Nasopharyngeal Swab     Status: None   Collection Time: 03/10/21  5:27 PM   Specimen: Nasopharyngeal Swab; Nasopharyngeal(NP) swabs in vial transport medium  Result Value Ref Range Status   SARS Coronavirus 2 by RT PCR NEGATIVE NEGATIVE Final    Comment: (NOTE) SARS-CoV-2 target nucleic acids are NOT DETECTED.  The SARS-CoV-2 RNA is generally detectable in upper respiratory specimens during the acute phase of infection. The lowest concentration of SARS-CoV-2 viral copies this assay can detect is 138 copies/mL. A negative result does not preclude SARS-Cov-2 infection and should not be used as the sole basis for treatment or other patient management decisions. A negative result may occur with  improper specimen collection/handling, submission of specimen other than nasopharyngeal swab, presence of viral mutation(s) within the areas targeted by this assay, and inadequate number of viral copies(<138 copies/mL). A negative result must be combined with clinical observations, patient history, and epidemiological information. The expected result is Negative.  Fact Sheet for Patients:  EntrepreneurPulse.com.au  Fact Sheet for Healthcare Providers:  IncredibleEmployment.be  This test is no t yet approved or cleared by the [place] FDA and  has been authorized for detection and/or diagnosis of SARS-CoV-2 by FDA under an Emergency Use Authorization (EUA). This EUA will remain  in effect (meaning this test can be used) for the duration of the COVID-19 declaration under Section 564(b)(1) of the Act, 21 U.S.C.section 360bbb-3(b)(1), unless the authorization is terminated  or revoked sooner.       Influenza A by PCR NEGATIVE NEGATIVE Final   Influenza B by PCR NEGATIVE NEGATIVE Final    Comment: (NOTE) The Xpert Xpress SARS-CoV-2/FLU/RSV plus assay is intended as an aid in the diagnosis of influenza from Nasopharyngeal swab specimens and should not be  used as a sole basis for treatment. Nasal washings and aspirates are unacceptable for Xpert Xpress SARS-CoV-2/FLU/RSV testing.  Fact Sheet for Patients: EntrepreneurPulse.com.au  Fact Sheet for Healthcare Providers: IncredibleEmployment.be  This test is not yet approved or cleared by the [place] FDA and has been authorized for detection and/or diagnosis of SARS-CoV-2 by FDA under an Emergency Use Authorization (EUA). This EUA will remain  in effect (meaning this test can be used) for the duration of the COVID-19 declaration under Section 564(b)(1) of the Act, 21 U.S.C. section 360bbb-3(b)(1), unless the authorization is terminated or revoked.  Performed at [facility], [place] [address], [place], [place] [address]      Radiological Exams on Admission: CT Abdomen Pelvis Wo Contrast  Result Date: 03/10/2021 CLINICAL DATA:  Abdominal trauma. Bilateral flank pain after fall. Back pain after syncope. EXAM: CT ABDOMEN AND PELVIS WITHOUT CONTRAST TECHNIQUE: Multidetector CT imaging of the abdomen and pelvis was performed following the standard protocol without IV contrast. COMPARISON:  None. FINDINGS: Lower chest: Linear atelectasis in the right lower lobe. No basilar pleural effusion or pneumothorax. Coronary artery calcifications/stent. Mild elevation of right hemidiaphragm. Hepatobiliary: Mild hepatic steatosis. No evidence of focal hepatic abnormality or hepatic injury, evaluation for injury is limited in the absence of IV contrast. Gallbladder physiologically distended, no calcified stone. No biliary dilatation. Pancreas: No evidence of injury. No ductal dilatation or inflammation. Spleen: No evidence of injury on this unenhanced exam. Normal in size. Adrenals/Urinary Tract: No adrenal hemorrhage or nodule. There are multiple bilateral intrarenal calculi. No hydronephrosis or renal obstruction. Slight bilateral perinephric edema is symmetric in  typically chronic. Low-density lesion in the anterior upper left kidney measures approximately 17 mm. This cannot be definitively characterized as simple cyst. No evidence of renal hematoma. Unremarkable urinary bladder. Stomach/Bowel: No evidence of bowel injury or mesenteric hematoma. There is no bowel wall thickening, free air or free fluid. The stomach is decompressed. Normal appendix. Moderate colonic stool burden with colonic redundancy. Occasional descending colonic diverticula. No diverticulitis. Vascular/Lymphatic: Aortic atherosclerosis. There is no retroperitoneal fluid to suggest vascular injury. No abdominopelvic adenopathy. Reproductive: Enlarged prostate gland. Other: No free air, free fluid, or focal fluid collection. No confluent body wall contusion. Fat containing bilateral inguinal hernias. Musculoskeletal: Bilateral hip arthroplasties without periprosthetic lucency or fracture. Diffuse degenerative change throughout the spine. No evidence of spine or pelvic fracture. No fracture of the included ribs. IMPRESSION: 1. No evidence of acute traumatic injury to the abdomen or pelvis. 2. Bilateral nonobstructing nephrolithiasis. 3. Low-density lesion in the anterior upper left kidney measures 17 mm, cannot be definitively characterized as simple cyst. Recommend nonemergent renal protocol MRI on an elective basis for further assessment. 4. Incidental findings of colonic diverticulosis without diverticulitis, enlarged prostate gland, and fat containing bilateral inguinal hernias. Aortic Atherosclerosis (ICD10-I70.0). Electronically Signed   By: Keith Rake M.D.   On: 03/10/2021 19:15   DG Thoracic Spine 2 View  Result Date: 03/10/2021 CLINICAL DATA:  Fall, back pain EXAM: THORACIC SPINE 2 VIEWS COMPARISON:  None. FINDINGS: Normal alignment. No acute fracture or listhesis. Vertebral body height is preserved. Degenerative changes with flowing disc osteophytes throughout the thoracic spine are  identified. The paraspinal soft tissues are unremarkable. Incidental note is made of eventration of the right hemidiaphragm. IMPRESSION: Degenerative change.  No acute fracture or listhesis. Electronically Signed   By: Fidela Salisbury M.D.   On: 03/10/2021 22:00   CT Head Wo Contrast  Result Date: 03/10/2021 CLINICAL DATA:  Head trauma, minor, normal mental status. Neck trauma, uncomplicated. Additional history provided: syncope (with head trauma) EXAM: CT HEAD WITHOUT CONTRAST CT CERVICAL SPINE WITHOUT CONTRAST TECHNIQUE: Multidetector CT imaging of the head and cervical spine was performed following the standard protocol without intravenous contrast. Multiplanar CT image reconstructions of the cervical spine were also generated. COMPARISON:  None FINDINGS: CT HEAD FINDINGS Brain: Cerebral volume is normal  for age. There is no acute intracranial hemorrhage. No demarcated cortical infarct. No extra-axial fluid collection. No evidence of an intracranial mass. No midline shift. Vascular: No hyperdense vessel. Atherosclerotic calcifications Skull: Normal. Negative for fracture or focal lesion. Sinuses/Orbits: Bilateral proptosis. Trace mucosal thickening within the bilateral ethmoid and right maxillary sinuses at the imaged levels. CT CERVICAL SPINE FINDINGS Alignment: Straightening of the expected cervical lordosis. No significant spondylolisthesis. Skull base and vertebrae: The basion-dental and atlanto-dental intervals are maintained.No evidence of acute fracture to the cervical spine. Soft tissues and spinal canal: No prevertebral fluid or swelling. No visible canal hematoma. Disc levels: Cervical spondylosis. No more than mild disc space narrowing. Multilevel shallow disc bulges, endplate spurring and uncovertebral hypertrophy. Ventrolateral osteophytes throughout the cervical and visualized upper thoracic spine, some of which are bridging. These osteophytes are most prominent at the C2-C3 through C7-T1  levels. A disc bulge, endplate spurring and prominent ossification of the posterior longitudinal ligament contribute to severe spinal canal stenosis at C4-C5. Multilevel bony neural foraminal narrowing. Probable facet joint ankylosis on the right at T1-T2 and T2-T3. Upper chest: No consolidation within the imaged lung apices. No visible pneumothorax. IMPRESSION: CT head: 1. No evidence of acute intracranial abnormality. 2. Incidentally noted bilateral proptosis. CT cervical spine: 1. No evidence of acute fracture to the cervical spine. 2. Straightening of the expected cervical lordosis. 3. Cervical spondylosis and ossification of the posterior longitudinal ligament, as described. Spondylosis and prominent ossification of the posterior longitudinal ligament at C4-C5 result in severe spinal canal stenosis with suspected spinal cord impingement. A cervical spine MRI is recommended for further evaluation. Also of note, there are ventrolateral osteophytes throughout the cervical and visualized upper thoracic spine, some of which are bridging and many of which are prominent. These findings raise suspicion for diffuse idiopathic skeletal hyperostosis (DISH). Suspected facet joint ankylosis on the right at T1-T2 and T2-T3. Electronically Signed   By: Kellie Simmering D.O.   On: 03/10/2021 11:11   CT Cervical Spine Wo Contrast  Result Date: 03/10/2021 CLINICAL DATA:  Head trauma, minor, normal mental status. Neck trauma, uncomplicated. Additional history provided: syncope (with head trauma) EXAM: CT HEAD WITHOUT CONTRAST CT CERVICAL SPINE WITHOUT CONTRAST TECHNIQUE: Multidetector CT imaging of the head and cervical spine was performed following the standard protocol without intravenous contrast. Multiplanar CT image reconstructions of the cervical spine were also generated. COMPARISON:  None FINDINGS: CT HEAD FINDINGS Brain: Cerebral volume is normal for age. There is no acute intracranial hemorrhage. No demarcated cortical  infarct. No extra-axial fluid collection. No evidence of an intracranial mass. No midline shift. Vascular: No hyperdense vessel. Atherosclerotic calcifications Skull: Normal. Negative for fracture or focal lesion. Sinuses/Orbits: Bilateral proptosis. Trace mucosal thickening within the bilateral ethmoid and right maxillary sinuses at the imaged levels. CT CERVICAL SPINE FINDINGS Alignment: Straightening of the expected cervical lordosis. No significant spondylolisthesis. Skull base and vertebrae: The basion-dental and atlanto-dental intervals are maintained.No evidence of acute fracture to the cervical spine. Soft tissues and spinal canal: No prevertebral fluid or swelling. No visible canal hematoma. Disc levels: Cervical spondylosis. No more than mild disc space narrowing. Multilevel shallow disc bulges, endplate spurring and uncovertebral hypertrophy. Ventrolateral osteophytes throughout the cervical and visualized upper thoracic spine, some of which are bridging. These osteophytes are most prominent at the C2-C3 through C7-T1 levels. A disc bulge, endplate spurring and prominent ossification of the posterior longitudinal ligament contribute to severe spinal canal stenosis at C4-C5. Multilevel bony neural foraminal narrowing. Probable facet  joint ankylosis on the right at T1-T2 and T2-T3. Upper chest: No consolidation within the imaged lung apices. No visible pneumothorax. IMPRESSION: CT head: 1. No evidence of acute intracranial abnormality. 2. Incidentally noted bilateral proptosis. CT cervical spine: 1. No evidence of acute fracture to the cervical spine. 2. Straightening of the expected cervical lordosis. 3. Cervical spondylosis and ossification of the posterior longitudinal ligament, as described. Spondylosis and prominent ossification of the posterior longitudinal ligament at C4-C5 result in severe spinal canal stenosis with suspected spinal cord impingement. A cervical spine MRI is recommended for further  evaluation. Also of note, there are ventrolateral osteophytes throughout the cervical and visualized upper thoracic spine, some of which are bridging and many of which are prominent. These findings raise suspicion for diffuse idiopathic skeletal hyperostosis (DISH). Suspected facet joint ankylosis on the right at T1-T2 and T2-T3. Electronically Signed   By: Kellie Simmering D.O.   On: 03/10/2021 11:11    EKG: Independently reviewed.  Normal sinus rhythm with premature atrial complexes QTC of 429 ms.  Assessment/Plan Principal Problem:   Syncope Active Problems:   HLD (hyperlipidemia)   Hypothyroidism   Back pain    Syncope cause not clear.  Has had no prodrome.  We will closely monitor telemetry likely will need Holter monitor. Mid back pain after fall.  Patient does not want MRI to be done.  We will get x-ray of the T-spine. Abnormal lesion in the left kidney will need further work-up as outpatient. Spinal canal stenosis with impingement of the cord will need follow-up with neurosurgery.  Patient does complain of some numbness in the fourth finger of both hands. Hyperlipidemia on statins. Hypothyroidism on Synthroid. Alcohol use we will keep patient on CIWA. History of gout on allopurinol. Recently treated for bronchitis. History of prostate cancer being followed at [place].  Presently not on any medications.   DVT prophylaxis: Lovenox. Code Status: Full code. Family Communication: Discussed with patient. Disposition Plan: Home. Consults called: Physical therapy. Admission status: Observation.   Rise Patience MD [facility] Pager [phone].  If 7PM-7AM, please contact night-coverage [email or web address] Password [facility]  03/10/2021, 10:38 PM

## 2021-03-10 NOTE — ED Provider Notes (Signed)
[place]    CSN: [id number] Arrival date & time: 03/10/21  4782      History   Chief Complaint Chief Complaint  Patient presents with   Loss of Consciousness   Neck Pain    HPI Kyle Delacruz is a 66 y.o. male.   Patient here for evaluation for having a syncopal episode on Wednesday.  Reports that he was standing in his kitchen when he suddenly passed out, fell backwards, and hit his head.  Reports still having a headache as well as neck and back pain, and tingling in his hands.  Denies any dizziness, blurred vision, nausea, or vomiting.  Denies any specific alleviating or aggravating factors.  Denies any fevers, chest pain, shortness of breath, N/V/D, weakness, abdominal pain.    The history is provided by the patient.  Loss of Consciousness Associated symptoms: headaches   Associated symptoms: no fever   Neck Pain Associated symptoms: headaches and syncope   Associated symptoms: no fever    Past Medical History:  Diagnosis Date   Allergy    Arthritis    osteo   CTS (carpal tunnel syndrome)    DJD (degenerative joint disease)    DM2 (diabetes mellitus, type 2) (HCC)    GERD (gastroesophageal reflux disease)    Gout    History of adenomatous polyps of colon 12/29/2019   History of nuclear stress test    Myoview 11/17: EF 54, no ST changes, hypertensive blood pressure response, no ischemia, low risk   Hyperlipidemia    Hypothyroidism    Nicotine addiction    OSA (obstructive sleep apnea)    Prostatitis    Reactive airway disease    Sarcocystosis    Sinus arrhythmia    Sleep apnea    cpap- not wearing   Tinea corporis     Patient Active Problem List   Diagnosis Date Noted   History of adenomatous polyps of colon 12/29/2019    Past Surgical History:  Procedure Laterality Date   BILATERAL KNEE ARTHROSCOPY     right- 1997; left 2001   COLONOSCOPY  multiple last 2013   KNEE SURGERY Right 2014   torn R medial meniscus- arthroscopy done    PARATHYROIDECTOMY  2005   ROTATOR CUFF REPAIR  2004   right   TONSILLECTOMY     TOTAL HIP ARTHROPLASTY Left 2009   TOTAL HIP ARTHROPLASTY Right 2006   UPPER GASTROINTESTINAL ENDOSCOPY         Home Medications    Prior to Admission medications   Medication Sig Start Date End Date Taking? Authorizing Provider  allopurinol (ZYLOPRIM) 300 MG tablet Take 300 mg by mouth daily.  06/30/11   [provider]  aspirin 81 MG tablet Take 81 mg by mouth daily.    [provider]  CRESTOR 20 MG tablet Take 20 mg by mouth daily.  06/30/11   [provider]  esomeprazole (NEXIUM) 40 MG capsule Take 1 capsule (40 mg total) by mouth 2 (two) times daily before a meal. 03/08/16   Esterwood, Amy S, PA-C  fluticasone (FLONASE) 50 MCG/ACT nasal spray Place 1 spray into the nose 2 (two) times daily.  Patient not taking: Reported on 12/21/2019 06/30/11   [provider]  levothyroxine (SYNTHROID, LEVOTHROID) 50 MCG tablet Take 1 tablet by mouth daily. 02/18/15   [provider]  naproxen (NAPROSYN) 500 MG tablet TAKE 1 TABLET TWICE A DAY AS NEEDED FOR PAIN Patient not taking: Reported on 12/21/2019 12/29/15  [provider]  Probiotic Product (ALIGN PO) Take 1 tablet by mouth daily. PATIENT NOT SURE OF DOSE (OTC PROBIOTIC)    [provider]    Family History Family History  Problem Relation Age of Onset   Stroke Mother    Diabetes Father    Heart disease Father        sp CABG   Diabetes Paternal Grandmother    Colon cancer Neg Hx    Stomach cancer Neg Hx    Esophageal cancer Neg Hx    Rectal cancer Neg Hx    Prostate cancer Neg Hx     Social History Social History   Tobacco Use   Smoking status: Former    Packs/day: 0.50    Types: Cigarettes   Smokeless tobacco: Never   Tobacco comments:    Quit March 2020  Vaping Use   Vaping Use: Never used  Substance Use Topics   Alcohol use: Yes    Alcohol/week: 21.0 standard drinks     Types: 21 Cans of beer per week    Comment: 14 cans a week   Drug use: No     Allergies   Iohexol, Penicillins, and Shellfish allergy   Review of Systems Review of Systems  Constitutional:  Negative for fever.  Cardiovascular:  Positive for syncope.  Musculoskeletal:  Positive for back pain and neck pain.  Neurological:  Positive for syncope and headaches.  All other systems reviewed and are negative.   Physical Exam Triage Vital Signs ED Triage Vitals  Enc Vitals Group     BP 03/10/21 0837 (!) 147/95     Pulse Rate 03/10/21 0837 93     Resp 03/10/21 0837 20     Temp 03/10/21 0837 98.1 F (36.7 C)     Temp src --      SpO2 03/10/21 0837 97 %     Weight --      Height --      Head Circumference --      Peak Flow --      Pain Score 03/10/21 0838 9     Pain Loc --      Pain Edu? --      Excl. in [place]? --    No data found.  Updated Vital Signs BP (!) 147/95   Pulse 93   Temp 98.1 F (36.7 C)   Resp 20   SpO2 97%   Visual Acuity Right Eye Distance:   Left Eye Distance:   Bilateral Distance:    Right Eye Near:   Left Eye Near:    Bilateral Near:     Physical Exam Vitals and nursing note reviewed.  Constitutional:      General: He is not in acute distress.    Appearance: Normal appearance. He is not ill-appearing, toxic-appearing or diaphoretic.  HENT:     Head: Normocephalic and atraumatic.  Eyes:     Conjunctiva/sclera: Conjunctivae normal.  Cardiovascular:     Rate and Rhythm: Normal rate.     Pulses: Normal pulses.     Heart sounds: Normal heart sounds.  Pulmonary:     Effort: Pulmonary effort is normal.     Breath sounds: Normal breath sounds.  Abdominal:     General: Abdomen is flat.  Musculoskeletal:        General: Normal range of motion.     Cervical back: Normal range of motion.  Skin:    General: Skin is warm and dry.  Neurological:  General: No focal deficit present.     Mental Status: He is alert and oriented to person, place,  and time.  Psychiatric:        Mood and Affect: Mood normal.     UC Treatments / Results  Labs (all labs ordered are listed, but only abnormal results are displayed) Labs Reviewed - No data to display  EKG   Radiology No results found.  Procedures Procedures (including critical care time)  Medications Ordered in UC Medications - No data to display  Initial Impression / Assessment and Plan / UC Course  I have reviewed the triage vital signs and the nursing notes.  Pertinent labs & imaging results that were available during my care of the patient were reviewed by me and considered in my medical decision making (see chart for details).    As patient is still having a headache following a syncopal episode and hitting his head, it was recommended that he go to the emergency room for further evaluation.  Patient is also complaining of some numbness and tingling in his hands as well as neck and back pain.  Patient is in need of evaluation at a higher level of care.  Patient verbalized understanding.  Vital signs are stable so patient may transport himself to ED via private vehicle with family member. Final Clinical Impressions(s) / UC Diagnoses   Final diagnoses:  Syncope, unspecified syncope type  Neck pain   Discharge Instructions   None    ED Prescriptions   None    PDMP not reviewed this encounter.   Pearson Forster, NP 03/10/21 [phone]

## 2021-03-10 NOTE — ED Notes (Signed)
Transported to radiology 

## 2021-03-10 NOTE — ED Notes (Signed)
Admit provider at bedside 

## 2021-03-10 NOTE — ED Provider Notes (Signed)
[facility] EMERGENCY DEPARTMENT Provider Note   CSN: [id number] Arrival date & time: 03/10/21  4268     History Chief Complaint  Patient presents with   Loss of Consciousness    Kyle Delacruz is a 65 y.o. male.  Patient presents chief complaint of bilateral low back pain and syncopal episode.  He states that 3 days ago he was standing up in his house looking at the window and the next he knew he was waking up on the ground.  He thinks he hurt his back at the time has been having back pain for the past 2 days now.  Denies headache or neck pain.  Denies any chest pain.  Denies fevers or cough or vomiting or diarrhea.  No prior syncopal episodes per patient.      Past Medical History:  Diagnosis Date   Allergy    Arthritis    osteo   CTS (carpal tunnel syndrome)    DJD (degenerative joint disease)    DM2 (diabetes mellitus, type 2) (HCC)    GERD (gastroesophageal reflux disease)    Gout    History of adenomatous polyps of colon 12/29/2019   History of nuclear stress test    Myoview 11/17: EF 54, no ST changes, hypertensive blood pressure response, no ischemia, low risk   Hyperlipidemia    Hypothyroidism    Nicotine addiction    OSA (obstructive sleep apnea)    Prostatitis    Reactive airway disease    Sarcocystosis    Sinus arrhythmia    Sleep apnea    cpap- not wearing   Tinea corporis     Patient Active Problem List   Diagnosis Date Noted   Syncope 03/10/2021   History of adenomatous polyps of colon 12/29/2019    Past Surgical History:  Procedure Laterality Date   BILATERAL KNEE ARTHROSCOPY     right- 1997; left 2001   COLONOSCOPY  multiple last 2013   KNEE SURGERY Right 2014   torn R medial meniscus- arthroscopy done   PARATHYROIDECTOMY  2005   [place]  2004   right   TONSILLECTOMY     TOTAL HIP ARTHROPLASTY Left 2009   TOTAL HIP ARTHROPLASTY Right 2006   UPPER GASTROINTESTINAL ENDOSCOPY         Family History   Problem Relation Age of Onset   Stroke Mother    Diabetes Father    Heart disease Father        sp CABG   Diabetes Paternal Grandmother    Colon cancer Neg Hx    Stomach cancer Neg Hx    Esophageal cancer Neg Hx    Rectal cancer Neg Hx    Prostate cancer Neg Hx     Social History   Tobacco Use   Smoking status: Former    Packs/day: 0.50    Types: Cigarettes   Smokeless tobacco: Never   Tobacco comments:    Quit March 2020  Vaping Use   Vaping Use: Never used  Substance Use Topics   Alcohol use: Yes    Alcohol/week: 21.0 standard drinks    Types: 21 Cans of beer per week    Comment: 14 cans a week   Drug use: No    Home Medications Prior to Admission medications   Medication Sig Start Date End Date Taking? Authorizing Provider  albuterol (VENTOLIN HFA) 108 (90 Base) MCG/ACT inhaler Inhale 2 puffs into the lungs every 4 (four) hours. 03/07/21  Yes  [provider]  allopurinol (ZYLOPRIM) 300 MG tablet Take 300 mg by mouth daily.  06/30/11  Yes [provider]  azithromycin (ZITHROMAX) 250 MG tablet Take 250-500 mg by mouth as directed. 03/07/21  Yes [provider]  CRESTOR 20 MG tablet Take 20 mg by mouth daily.  06/30/11  Yes [provider]  ergocalciferol (VITAMIN D2) 1.25 MG (50000 UT) capsule Take 50,000 Units by mouth once a week. Monday 08/15/20  Yes [provider]  fluticasone (FLONASE) 50 MCG/ACT nasal spray Place 1 spray into the nose 2 (two) times daily as needed for allergies. 06/30/11  Yes [provider]  levothyroxine (SYNTHROID, LEVOTHROID) 50 MCG tablet Take 50 mcg by mouth daily. 02/18/15  Yes [provider]  methylPREDNISolone (MEDROL DOSEPAK) 4 MG TBPK tablet Take 4-20 mg by mouth See admin instructions. 03/07/21  Yes [provider]  naproxen sodium (ALEVE) 220 MG tablet Take 440 mg by mouth 2 (two) times daily as needed (pain).   Yes [provider]  omeprazole (PRILOSEC) 40  MG capsule Take 40 mg by mouth 2 (two) times daily. 01/31/21  Yes [provider]  SPIRIVA HANDIHALER 18 MCG inhalation capsule Place 1 capsule into inhaler and inhale daily. 01/30/21  Yes [provider]  Grant Ruts INHUB 250-50 MCG/ACT AEPB Inhale 1 puff into the lungs 2 (two) times daily. 01/30/21  Yes [provider]  chlorpheniramine-HYDROcodone (TUSSIONEX) 10-8 MG/5ML SUER Take 5 mLs by mouth every 12 (twelve) hours as needed for cough. 03/07/21   [provider]    Allergies    Iohexol, Penicillins, and Shellfish allergy  Review of Systems   Review of Systems  Constitutional:  Negative for fever.  HENT:  Negative for ear pain and sore throat.   Eyes:  Negative for pain.  Respiratory:  Negative for cough.   Cardiovascular:  Negative for chest pain.  Gastrointestinal:  Negative for abdominal pain.  Genitourinary:  Negative for flank pain.  Musculoskeletal:  Positive for back pain.  Skin:  Negative for color change and rash.  Neurological:  Negative for syncope.  All other systems reviewed and are negative.  Physical Exam Updated Vital Signs BP (!) 157/113   Pulse 93   Temp 98.4 F (36.9 C) (Oral)   Resp (!) 21   SpO2 100%   Physical Exam Constitutional:      Appearance: He is well-developed.  HENT:     Head: Normocephalic.     Nose: Nose normal.  Eyes:     Extraocular Movements: Extraocular movements intact.  Cardiovascular:     Rate and Rhythm: Normal rate.  Pulmonary:     Effort: Pulmonary effort is normal.  Musculoskeletal:     Comments: No C or T spine midline step-offs or tenderness noted.  No L-spine midline step-offs or tenderness noted.  He does have L3-4 bilateral paraspinal tenderness present.  Otherwise no extremity pain or discomfort or abnormal range of motion.  Skin:    Coloration: Skin is not jaundiced.  Neurological:     General: No focal deficit present.     Mental Status: He is alert and oriented to person,  place, and time. Mental status is at baseline.    ED Results / Procedures / Treatments   Labs (all labs ordered are listed, but only abnormal results are displayed) Labs Reviewed  BASIC METABOLIC PANEL - Abnormal; Notable for the following components:      Result Value   Glucose, Bld 158 (*)  All other components within normal limits  RESP PANEL BY RT-PCR (FLU A&B, COVID) ARPGX2  CBC WITH DIFFERENTIAL/PLATELET  CBG MONITORING, ED  TROPONIN I (HIGH SENSITIVITY)    EKG EKG Interpretation  Date/Time:  Friday March 10 2021 09:28:25 EDT Ventricular Rate:  97 PR Interval:  118 QRS Duration: 64 QT Interval:  338 QTC Calculation: 429 R Axis:   3 Text Interpretation: Sinus rhythm with Premature atrial complexes Inferior infarct , age undetermined Abnormal ECG Confirmed by Thamas Jaegers ([id number]) on 03/10/2021 5:18:58 PM  Radiology CT Abdomen Pelvis Wo Contrast  Result Date: 03/10/2021 CLINICAL DATA:  Abdominal trauma. Bilateral flank pain after fall. Back pain after syncope. EXAM: CT ABDOMEN AND PELVIS WITHOUT CONTRAST TECHNIQUE: Multidetector CT imaging of the abdomen and pelvis was performed following the standard protocol without IV contrast. COMPARISON:  None. FINDINGS: Lower chest: Linear atelectasis in the right lower lobe. No basilar pleural effusion or pneumothorax. Coronary artery calcifications/stent. Mild elevation of right hemidiaphragm. Hepatobiliary: Mild hepatic steatosis. No evidence of focal hepatic abnormality or hepatic injury, evaluation for injury is limited in the absence of IV contrast. Gallbladder physiologically distended, no calcified stone. No biliary dilatation. Pancreas: No evidence of injury. No ductal dilatation or inflammation. Spleen: No evidence of injury on this unenhanced exam. Normal in size. Adrenals/Urinary Tract: No adrenal hemorrhage or nodule. There are multiple bilateral intrarenal calculi. No hydronephrosis or renal obstruction. Slight bilateral  perinephric edema is symmetric in typically chronic. Low-density lesion in the anterior upper left kidney measures approximately 17 mm. This cannot be definitively characterized as simple cyst. No evidence of renal hematoma. Unremarkable urinary bladder. Stomach/Bowel: No evidence of bowel injury or mesenteric hematoma. There is no bowel wall thickening, free air or free fluid. The stomach is decompressed. Normal appendix. Moderate colonic stool burden with colonic redundancy. Occasional descending colonic diverticula. No diverticulitis. Vascular/Lymphatic: Aortic atherosclerosis. There is no retroperitoneal fluid to suggest vascular injury. No abdominopelvic adenopathy. Reproductive: Enlarged prostate gland. Other: No free air, free fluid, or focal fluid collection. No confluent body wall contusion. Fat containing bilateral inguinal hernias. Musculoskeletal: Bilateral hip arthroplasties without periprosthetic lucency or fracture. Diffuse degenerative change throughout the spine. No evidence of spine or pelvic fracture. No fracture of the included ribs. IMPRESSION: 1. No evidence of acute traumatic injury to the abdomen or pelvis. 2. Bilateral nonobstructing nephrolithiasis. 3. Low-density lesion in the anterior upper left kidney measures 17 mm, cannot be definitively characterized as simple cyst. Recommend nonemergent renal protocol MRI on an elective basis for further assessment. 4. Incidental findings of colonic diverticulosis without diverticulitis, enlarged prostate gland, and fat containing bilateral inguinal hernias. Aortic Atherosclerosis (ICD10-I70.0). Electronically Signed   By: Keith Rake M.D.   On: 03/10/2021 19:15   CT Head Wo Contrast  Result Date: 03/10/2021 CLINICAL DATA:  Head trauma, minor, normal mental status. Neck trauma, uncomplicated. Additional history provided: syncope (with head trauma) EXAM: CT HEAD WITHOUT CONTRAST CT CERVICAL SPINE WITHOUT CONTRAST TECHNIQUE: Multidetector CT  imaging of the head and cervical spine was performed following the standard protocol without intravenous contrast. Multiplanar CT image reconstructions of the cervical spine were also generated. COMPARISON:  None FINDINGS: CT HEAD FINDINGS Brain: Cerebral volume is normal for age. There is no acute intracranial hemorrhage. No demarcated cortical infarct. No extra-axial fluid collection. No evidence of an intracranial mass. No midline shift. Vascular: No hyperdense vessel. Atherosclerotic calcifications Skull: Normal. Negative for fracture or focal lesion. Sinuses/Orbits: Bilateral proptosis. Trace mucosal thickening within the bilateral ethmoid and right maxillary  sinuses at the imaged levels. CT CERVICAL SPINE FINDINGS Alignment: Straightening of the expected cervical lordosis. No significant spondylolisthesis. Skull base and vertebrae: The basion-dental and atlanto-dental intervals are maintained.No evidence of acute fracture to the cervical spine. Soft tissues and spinal canal: No prevertebral fluid or swelling. No visible canal hematoma. Disc levels: Cervical spondylosis. No more than mild disc space narrowing. Multilevel shallow disc bulges, endplate spurring and uncovertebral hypertrophy. Ventrolateral osteophytes throughout the cervical and visualized upper thoracic spine, some of which are bridging. These osteophytes are most prominent at the C2-C3 through C7-T1 levels. A disc bulge, endplate spurring and prominent ossification of the posterior longitudinal ligament contribute to severe spinal canal stenosis at C4-C5. Multilevel bony neural foraminal narrowing. Probable facet joint ankylosis on the right at T1-T2 and T2-T3. Upper chest: No consolidation within the imaged lung apices. No visible pneumothorax. IMPRESSION: CT head: 1. No evidence of acute intracranial abnormality. 2. Incidentally noted bilateral proptosis. CT cervical spine: 1. No evidence of acute fracture to the cervical spine. 2.  Straightening of the expected cervical lordosis. 3. Cervical spondylosis and ossification of the posterior longitudinal ligament, as described. Spondylosis and prominent ossification of the posterior longitudinal ligament at C4-C5 result in severe spinal canal stenosis with suspected spinal cord impingement. A cervical spine MRI is recommended for further evaluation. Also of note, there are ventrolateral osteophytes throughout the cervical and visualized upper thoracic spine, some of which are bridging and many of which are prominent. These findings raise suspicion for diffuse idiopathic skeletal hyperostosis (DISH). Suspected facet joint ankylosis on the right at T1-T2 and T2-T3. Electronically Signed   By: Kellie Simmering D.O.   On: 03/10/2021 11:11   CT Cervical Spine Wo Contrast  Result Date: 03/10/2021 CLINICAL DATA:  Head trauma, minor, normal mental status. Neck trauma, uncomplicated. Additional history provided: syncope (with head trauma) EXAM: CT HEAD WITHOUT CONTRAST CT CERVICAL SPINE WITHOUT CONTRAST TECHNIQUE: Multidetector CT imaging of the head and cervical spine was performed following the standard protocol without intravenous contrast. Multiplanar CT image reconstructions of the cervical spine were also generated. COMPARISON:  None FINDINGS: CT HEAD FINDINGS Brain: Cerebral volume is normal for age. There is no acute intracranial hemorrhage. No demarcated cortical infarct. No extra-axial fluid collection. No evidence of an intracranial mass. No midline shift. Vascular: No hyperdense vessel. Atherosclerotic calcifications Skull: Normal. Negative for fracture or focal lesion. Sinuses/Orbits: Bilateral proptosis. Trace mucosal thickening within the bilateral ethmoid and right maxillary sinuses at the imaged levels. CT CERVICAL SPINE FINDINGS Alignment: Straightening of the expected cervical lordosis. No significant spondylolisthesis. Skull base and vertebrae: The basion-dental and atlanto-dental  intervals are maintained.No evidence of acute fracture to the cervical spine. Soft tissues and spinal canal: No prevertebral fluid or swelling. No visible canal hematoma. Disc levels: Cervical spondylosis. No more than mild disc space narrowing. Multilevel shallow disc bulges, endplate spurring and uncovertebral hypertrophy. Ventrolateral osteophytes throughout the cervical and visualized upper thoracic spine, some of which are bridging. These osteophytes are most prominent at the C2-C3 through C7-T1 levels. A disc bulge, endplate spurring and prominent ossification of the posterior longitudinal ligament contribute to severe spinal canal stenosis at C4-C5. Multilevel bony neural foraminal narrowing. Probable facet joint ankylosis on the right at T1-T2 and T2-T3. Upper chest: No consolidation within the imaged lung apices. No visible pneumothorax. IMPRESSION: CT head: 1. No evidence of acute intracranial abnormality. 2. Incidentally noted bilateral proptosis. CT cervical spine: 1. No evidence of acute fracture to the cervical spine. 2. Straightening of  the expected cervical lordosis. 3. Cervical spondylosis and ossification of the posterior longitudinal ligament, as described. Spondylosis and prominent ossification of the posterior longitudinal ligament at C4-C5 result in severe spinal canal stenosis with suspected spinal cord impingement. A cervical spine MRI is recommended for further evaluation. Also of note, there are ventrolateral osteophytes throughout the cervical and visualized upper thoracic spine, some of which are bridging and many of which are prominent. These findings raise suspicion for diffuse idiopathic skeletal hyperostosis (DISH). Suspected facet joint ankylosis on the right at T1-T2 and T2-T3. Electronically Signed   By: Kellie Simmering D.O.   On: 03/10/2021 11:11    Procedures Procedures   Medications Ordered in ED Medications  acetaminophen (TYLENOL) tablet 650 mg (650 mg Oral Given 03/10/21  1847)  ibuprofen (ADVIL) tablet 400 mg (400 mg Oral Given 03/10/21 1847)    ED Course  I have reviewed the triage vital signs and the nursing notes.  Pertinent labs & imaging results that were available during my care of the patient were reviewed by me and considered in my medical decision making (see chart for details).    MDM Rules/Calculators/A&P                           EKG is concerning for sinus arrhythmia and episodes of sinus bigeminy.  Labs otherwise unremarkable troponin is negative CT imaging shows evidence of cervical disease but the patient has no neck pain or discomfort and no focal weakness further imaging will be deferred for this time.  Given abnormal EKG and syncopal episode, medicine consulted for admission. Final Clinical Impression(s) / ED Diagnoses Final diagnoses:  Syncope and collapse  Sinus arrhythmia    Rx / DC Orders ED Discharge Orders     None        Luna Fuse, MD 03/10/21 1949

## 2021-03-10 NOTE — ED Notes (Signed)
Received verbal report from Bailey B RN at this time 

## 2021-03-10 NOTE — ED Notes (Signed)
This RN received approval from the pt to give his step daughter Isabel Caprice an update

## 2021-03-10 NOTE — ED Notes (Signed)
Chanel Edwards (step daughter) updated

## 2021-03-10 NOTE — ED Triage Notes (Signed)
Pt reports he fell while in his kitchen on Wednesday.Pt hit his head and had LOC. Pt reports Neck pain ,tingling to bil. Ring fingers and back pain.

## 2021-03-10 NOTE — ED Triage Notes (Signed)
Patient here for evaluation of back pain after an unexpected syncope that caused him fall onto his back Wednesday. Patient states he was looking out a window his kitchen when suddenly blacked out, patient states he was standing and was not changing positions. Patient states his recollection is limited to remember looking out a window and then waking up on the floor.

## 2021-03-11 ENCOUNTER — Observation Stay (HOSPITAL_BASED_OUTPATIENT_CLINIC_OR_DEPARTMENT_OTHER): Payer: Medicare Other

## 2021-03-11 ENCOUNTER — Other Ambulatory Visit: Payer: Self-pay | Admitting: Medical

## 2021-03-11 DIAGNOSIS — M4802 Spinal stenosis, cervical region: Secondary | ICD-10-CM | POA: Diagnosis not present

## 2021-03-11 DIAGNOSIS — R55 Syncope and collapse: Secondary | ICD-10-CM

## 2021-03-11 DIAGNOSIS — Z9989 Dependence on other enabling machines and devices: Secondary | ICD-10-CM | POA: Diagnosis not present

## 2021-03-11 DIAGNOSIS — G4733 Obstructive sleep apnea (adult) (pediatric): Secondary | ICD-10-CM

## 2021-03-11 LAB — CBC
HCT: 39.7 % (ref 39.0–52.0)
Hemoglobin: 13.2 g/dL (ref 13.0–17.0)
MCH: 31.1 pg (ref 26.0–34.0)
MCHC: 33.2 g/dL (ref 30.0–36.0)
MCV: 93.4 fL (ref 80.0–100.0)
Platelets: 145 10*3/uL — ABNORMAL LOW (ref 150–400)
RBC: 4.25 MIL/uL (ref 4.22–5.81)
RDW: 13.1 % (ref 11.5–15.5)
WBC: 9.3 10*3/uL (ref 4.0–10.5)
nRBC: 0 % (ref 0.0–0.2)

## 2021-03-11 LAB — BASIC METABOLIC PANEL
Anion gap: 10 (ref 5–15)
BUN: 19 mg/dL (ref 8–23)
CO2: 26 mmol/L (ref 22–32)
Calcium: 9.3 mg/dL (ref 8.9–10.3)
Chloride: 101 mmol/L (ref 98–111)
Creatinine, Ser: 1.02 mg/dL (ref 0.61–1.24)
GFR, Estimated: >60 mL/min (ref >60)
Glucose, Bld: 153 mg/dL — ABNORMAL HIGH (ref 70–99)
Potassium: 3.2 mmol/L — ABNORMAL LOW (ref 3.5–5.1)
Sodium: 137 mmol/L (ref 135–145)

## 2021-03-11 LAB — CREATININE, SERUM
Creatinine, Ser: 1.02 mg/dL (ref 0.61–1.24)
GFR, Estimated: >60 mL/min (ref >60)

## 2021-03-11 LAB — ECHOCARDIOGRAM COMPLETE
Area-P 1/2: 2.8 cm2
S' Lateral: 3.1 cm

## 2021-03-11 LAB — HIV ANTIBODY (ROUTINE TESTING W REFLEX): HIV Screen 4th Generation wRfx: NONREACTIVE

## 2021-03-11 MED ORDER — OXYCODONE-ACETAMINOPHEN 5-325 MG PO TABS
1.0000 | ORAL_TABLET | Freq: Three times a day (TID) | ORAL | Status: DC | PRN
  Administered 2021-03-11: 1 via ORAL
  Filled 2021-03-11: qty 1

## 2021-03-11 MED ORDER — FOLIC ACID 1 MG PO TABS: 1.0000 mg | ORAL_TABLET | Freq: Every day | ORAL | Status: DC

## 2021-03-11 MED ORDER — CYCLOBENZAPRINE HCL 5 MG PO TABS
5.0000 mg | ORAL_TABLET | Freq: Three times a day (TID) | ORAL | Status: DC | PRN
  Administered 2021-03-11: 5 mg via ORAL
  Filled 2021-03-11: qty 1

## 2021-03-11 MED ORDER — OXYCODONE-ACETAMINOPHEN 5-325 MG PO TABS: 1.0000 | ORAL_TABLET | Freq: Three times a day (TID) | ORAL | 0 refills | Status: DC | PRN

## 2021-03-11 MED ORDER — THIAMINE HCL 100 MG PO TABS
100.0000 mg | ORAL_TABLET | Freq: Every day | ORAL | Status: DC
  Administered 2021-03-11: 100 mg via ORAL
  Filled 2021-03-11: qty 1

## 2021-03-11 MED ORDER — THIAMINE HCL 100 MG PO TABS: 100.0000 mg | ORAL_TABLET | Freq: Every day | ORAL | Status: DC

## 2021-03-11 MED ORDER — THIAMINE HCL 100 MG/ML IJ SOLN: 100.0000 mg | Freq: Every day | INTRAMUSCULAR | Status: DC

## 2021-03-11 MED ORDER — PERFLUTREN LIPID MICROSPHERE
1.0000 mL | INTRAVENOUS | Status: AC | PRN
  Administered 2021-03-11: 2 mL via INTRAVENOUS
  Filled 2021-03-11: qty 10

## 2021-03-11 MED ORDER — POTASSIUM CHLORIDE CRYS ER 20 MEQ PO TBCR
40.0000 meq | EXTENDED_RELEASE_TABLET | Freq: Once | ORAL | Status: AC
  Administered 2021-03-11: 40 meq via ORAL
  Filled 2021-03-11: qty 2

## 2021-03-11 MED ORDER — ADULT MULTIVITAMIN W/MINERALS CH
1.0000 | ORAL_TABLET | Freq: Every day | ORAL | Status: DC
  Administered 2021-03-11: 1 via ORAL
  Filled 2021-03-11: qty 1

## 2021-03-11 MED ORDER — LORAZEPAM 2 MG/ML IJ SOLN: 1.0000 mg | INTRAMUSCULAR | Status: DC | PRN

## 2021-03-11 MED ORDER — FOLIC ACID 1 MG PO TABS
1.0000 mg | ORAL_TABLET | Freq: Every day | ORAL | Status: DC
  Administered 2021-03-11: 1 mg via ORAL
  Filled 2021-03-11: qty 1

## 2021-03-11 MED ORDER — LORAZEPAM 1 MG PO TABS: 1.0000 mg | ORAL_TABLET | ORAL | Status: DC | PRN

## 2021-03-11 NOTE — ED Notes (Signed)
Report called  

## 2021-03-11 NOTE — Discharge Summary (Signed)
Discharge Summary  Kyle Delacruz [facility] Kyle Delacruz DOB: May 17, 1955  PCP: Prince Solian, MD  Admit date: 03/10/2021 Discharge date: 03/11/2021  Time spent: 75mins, more than 50% time spent on coordination of care.  Case discussed with neurosurgery and cardiology. Wife updated at bedside.  Recommendations for Outpatient Follow-up:  F/u with PCP within a week  for hospital discharge follow up, repeat cbc/bmp at follow up.  PCP to follow-up on left renal lesion Follow-up with [place] [facility] in 2 weeks for severe cervical spine stenosis Follow-up with cardiology for outpatient cardiac monitoring    Discharge Diagnoses:  Active Hospital Problems   Diagnosis Date Noted   Syncope 03/10/2021   HLD (hyperlipidemia) 03/10/2021   Hypothyroidism 03/10/2021   Back pain 03/10/2021    Resolved Hospital Problems  No resolved problems to display.    Discharge Condition: stable  Diet recommendation: heart healthy  There were no vitals filed for this visit.  History of present illness: (Per admitting MD Dr. Hal Hope) Chief Complaint: Loss of consciousness and mid back pain.   HPI: Kyle Delacruz is a 65 y.o. male with history of hypothyroidism, hyperlipidemia, alcohol use with possible bronchitis was recently started on Z-Pak and prednisone for acute bronchitis had a syncopal episode about 3 days ago when patient was at his home.  Patient states he had lunch and was standing in his kitchen when he suddenly lost consciousness and fell onto the floor hit his head and regained consciousness.  He did not have any prodrome.  Denies of any chest pain or palpitations or shortness of breath prior to the episode or after.  Since the fall patient has been having mid back pain.  The pain has been worsening.  Has no associated incontinence of urine or bowel.  The pain does not radiate to the legs.   ED Course: In the ER EKG shows normal sinus rhythm with premature atrial  complexes.  QTC of 429 ms.  High sensitive troponins were negative.  CT head was unremarkable.  CT spine shows severe spinal canal stenosis with impingement of the spinal cord.  CT abdomen pelvis was done which was unremarkable.  CBC and metabolic panel unremarkable COVID test was negative patient admitted for further management of syncope and mid back pain.    Hospital Course:  Principal Problem:   Syncope Active Problems:   HLD (hyperlipidemia)   Hypothyroidism   Back pain  Syncope x1 without prodromal, no recurrence in hospital -CT head no acute findings -Troponin negative, EKG no acute findings, per chart review had a cardiac stress test in 2017 which is unremarkable -Orthostatic vital sign unremarkable -Echocardiogram no acute findings -Cardiac ultrasound unremarkable -Patient is feeling better, desires to go home, I have contacted cardiology who will arrange outpatient cardiac monitor and follow-up.  Severe C4-C5 cervical canal stenosis, does endorse numbness in fingers but does not appear to have weakness -Case discussed with neurosurgery on-call [name] over the phone who recommend outpatient follow up with neurosurgery in 2 weeks, patient's information/ chart sent to neurosurgery on call through secure chat as well  Mid back pain after fall -He declined MRI of thoracic spine, x-ray of T-spine no acute findings -Back pain has improved -F/u with neurosurgery, consider outpatient MRI if pain persists, currently ambulating without issues, no bowel and bladder incontinence   Low-density lesion in the anterior upper left kidney measures 17 mm, incidental finding on CT abdomen -Follow-up with PCP for Outpatient renal protocol MRI  Bilateral nonobstructing nephrolithiasis  Cr wnl  enlarged prostate gland, reports h/o prostate cancer  Has an appointment to have a prostate biopsy next week on Monday  Hypokalemia, replaced K  Hyperlipidemia on statins. Hypothyroidism on  Synthroid. Alcohol use , 14cans of beer weekly. History of gout on allopurinol. Recently treated for bronchitis.  OSA , encourage cpap compliance.  Procedures: Echocardiogram Carotid ultrasound  Consultations: Phone conversation with neurosurgery Secure chat messages with cardiology  Discharge Exam: BP 138/90 (BP Location: Right Arm)   Pulse (!) 101   Temp 98 F (36.7 C) (Oral)   Resp 16   SpO2 96%   General: NAD, very pleasant  Cardiovascular: RRR Respiratory: normal respiratory effort   Discharge Instructions You were cared for by a hospitalist during your hospital stay. If you have any questions about your discharge medications or the care you received while you were in the hospital after you are discharged, you can call the unit and asked to speak with the hospitalist on call if the hospitalist that took care of you is not available. Once you are discharged, your primary care physician will handle any further medical issues. Please note that NO REFILLS for any discharge medications will be authorized once you are discharged, as it is imperative that you return to your primary care physician (or establish a relationship with a primary care physician if you do not have one) for your aftercare needs so that they can reassess your need for medications and monitor your lab values.  Discharge Instructions     Diet - low sodium heart healthy   Complete by: As directed    Increase activity slowly   Complete by: As directed       Allergies as of 03/11/2021       Reactions   Iohexol     Code: RASH, Desc: rash 2 years ago, needs 13 hr prep for future exams   Penicillins Hives   Shellfish Allergy Swelling        Medication List     TAKE these medications    albuterol 108 (90 Base) MCG/ACT inhaler Commonly known as: VENTOLIN HFA Inhale 2 puffs into the lungs every 4 (four) hours.   allopurinol 300 MG tablet Commonly known as: ZYLOPRIM Take 300 mg by mouth daily.    azithromycin 250 MG tablet Commonly known as: ZITHROMAX Take 250-500 mg by mouth as directed.   chlorpheniramine-HYDROcodone 10-8 MG/5ML Suer Commonly known as: TUSSIONEX Take 5 mLs by mouth every 12 (twelve) hours as needed for cough.   Crestor 20 MG tablet Generic drug: rosuvastatin Take 20 mg by mouth daily.   ergocalciferol 1.25 MG (50000 UT) capsule Commonly known as: VITAMIN D2 Take 50,000 Units by mouth once a week. Monday   fluticasone 50 MCG/ACT nasal spray Commonly known as: FLONASE Place 1 spray into the nose 2 (two) times daily as needed for allergies.   folic acid 1 MG tablet Commonly known as: FOLVITE Take 1 tablet (1 mg total) by mouth daily. Start taking on: March 12, 2021   levothyroxine 50 MCG tablet Commonly known as: SYNTHROID Take 50 mcg by mouth daily.   methylPREDNISolone 4 MG Tbpk tablet Commonly known as: MEDROL DOSEPAK Take 4-20 mg by mouth See admin instructions.   naproxen sodium 220 MG tablet Commonly known as: ALEVE Take 440 mg by mouth 2 (two) times daily as needed (pain).   omeprazole 40 MG capsule Commonly known as: PRILOSEC Take 40 mg by mouth 2 (two) times daily.   oxyCODONE-acetaminophen 5-325  MG tablet Commonly known as: PERCOCET/ROXICET Take 1 tablet by mouth every 8 (eight) hours as needed for severe pain.   Spiriva HandiHaler 18 MCG inhalation capsule Generic drug: tiotropium Place 1 capsule into inhaler and inhale daily.   thiamine 100 MG tablet Take 1 tablet (100 mg total) by mouth daily. Start taking on: March 12, 2021   Wixela Inhub 250-50 MCG/ACT Aepb Generic drug: fluticasone-salmeterol Inhale 1 puff into the lungs 2 (two) times daily.       Allergies  Allergen Reactions   Iohexol      Code: RASH, Desc: rash 2 years ago, needs 13 hr prep for future exams    Penicillins Hives   Shellfish Allergy Swelling    Follow-up Information     [name], [name], MD Follow up in 1 week(s).   Specialty:  Internal Medicine Why: hospital discharge follow up, pcp to refer you to have outpatient MRI Low-density lesion in the anterior upper left kidney measures 31mm Contact information: [place] [address] [phone]         [place], [place] Follow up in 2 week(s).   Specialty: Neurosurgery Why: severe c4-5 spinal canal stenosis Contact information: [address] [place] [address] [phone]         Eleonore Chiquito, NP Follow up.   Specialty: Neurosurgery Contact information: [place] [address] [phone]         [place] [facility] Follow up.   Specialty: Cardiology Why: Our office will contact you to arrange the outpatient cardiac monitor and follow-up appointment to review your monitor results. Contact information: [address] [place] [phone]                 The results of significant diagnostics from this hospitalization (including imaging, microbiology, ancillary and laboratory) are listed below for reference.    Significant Diagnostic Studies: CT Abdomen Pelvis Wo Contrast  Result Date: 03/10/2021 CLINICAL DATA:  Abdominal trauma. Bilateral flank pain after fall. Back pain after syncope. EXAM: CT ABDOMEN AND PELVIS WITHOUT CONTRAST TECHNIQUE: Multidetector CT imaging of the abdomen and pelvis was performed following the standard protocol without IV contrast. COMPARISON:  None. FINDINGS: Lower chest: Linear atelectasis in the right lower lobe. No basilar pleural effusion or pneumothorax. Coronary artery calcifications/stent. Mild elevation of right hemidiaphragm. Hepatobiliary: Mild hepatic steatosis. No evidence of focal hepatic abnormality or hepatic injury, evaluation for injury is limited in the absence of IV contrast. Gallbladder physiologically distended, no calcified stone. No biliary  dilatation. Pancreas: No evidence of injury. No ductal dilatation or inflammation. Spleen: No evidence of injury on this unenhanced exam. Normal in size. Adrenals/Urinary Tract: No adrenal hemorrhage or nodule. There are multiple bilateral intrarenal calculi. No hydronephrosis or renal obstruction. Slight bilateral perinephric edema is symmetric in typically chronic. Low-density lesion in the anterior upper left kidney measures approximately 17 mm. This cannot be definitively characterized as simple cyst. No evidence of renal hematoma. Unremarkable urinary bladder. Stomach/Bowel: No evidence of bowel injury or mesenteric hematoma. There is no bowel wall thickening, free air or free fluid. The stomach is decompressed. Normal appendix. Moderate colonic stool burden with colonic redundancy. Occasional descending colonic diverticula. No diverticulitis. Vascular/Lymphatic: Aortic atherosclerosis. There is no retroperitoneal fluid to suggest vascular injury. No abdominopelvic adenopathy. Reproductive: Enlarged prostate gland. Other: No free air, free fluid, or focal fluid collection. No confluent body wall contusion.  Fat containing bilateral inguinal hernias. Musculoskeletal: Bilateral hip arthroplasties without periprosthetic lucency or fracture. Diffuse degenerative change throughout the spine. No evidence of spine or pelvic fracture. No fracture of the included ribs. IMPRESSION: 1. No evidence of acute traumatic injury to the abdomen or pelvis. 2. Bilateral nonobstructing nephrolithiasis. 3. Low-density lesion in the anterior upper left kidney measures 17 mm, cannot be definitively characterized as simple cyst. Recommend nonemergent renal protocol MRI on an elective basis for further assessment. 4. Incidental findings of colonic diverticulosis without diverticulitis, enlarged prostate gland, and fat containing bilateral inguinal hernias. Aortic Atherosclerosis (ICD10-I70.0). Electronically Signed   By: Keith Rake  M.D.   On: 03/10/2021 19:15   DG Thoracic Spine 2 View  Result Date: 03/10/2021 CLINICAL DATA:  Fall, back pain EXAM: THORACIC SPINE 2 VIEWS COMPARISON:  None. FINDINGS: Normal alignment. No acute fracture or listhesis. Vertebral body height is preserved. Degenerative changes with flowing disc osteophytes throughout the thoracic spine are identified. The paraspinal soft tissues are unremarkable. Incidental note is made of eventration of the right hemidiaphragm. IMPRESSION: Degenerative change.  No acute fracture or listhesis. Electronically Signed   By: Fidela Salisbury M.D.   On: 03/10/2021 22:00   CT Head Wo Contrast  Result Date: 03/10/2021 CLINICAL DATA:  Head trauma, minor, normal mental status. Neck trauma, uncomplicated. Additional history provided: syncope (with head trauma) EXAM: CT HEAD WITHOUT CONTRAST CT CERVICAL SPINE WITHOUT CONTRAST TECHNIQUE: Multidetector CT imaging of the head and cervical spine was performed following the standard protocol without intravenous contrast. Multiplanar CT image reconstructions of the cervical spine were also generated. COMPARISON:  None FINDINGS: CT HEAD FINDINGS Brain: Cerebral volume is normal for age. There is no acute intracranial hemorrhage. No demarcated cortical infarct. No extra-axial fluid collection. No evidence of an intracranial mass. No midline shift. Vascular: No hyperdense vessel. Atherosclerotic calcifications Skull: Normal. Negative for fracture or focal lesion. Sinuses/Orbits: Bilateral proptosis. Trace mucosal thickening within the bilateral ethmoid and right maxillary sinuses at the imaged levels. CT CERVICAL SPINE FINDINGS Alignment: Straightening of the expected cervical lordosis. No significant spondylolisthesis. Skull base and vertebrae: The basion-dental and atlanto-dental intervals are maintained.No evidence of acute fracture to the cervical spine. Soft tissues and spinal canal: No prevertebral fluid or swelling. No visible canal  hematoma. Disc levels: Cervical spondylosis. No more than mild disc space narrowing. Multilevel shallow disc bulges, endplate spurring and uncovertebral hypertrophy. Ventrolateral osteophytes throughout the cervical and visualized upper thoracic spine, some of which are bridging. These osteophytes are most prominent at the C2-C3 through C7-T1 levels. A disc bulge, endplate spurring and prominent ossification of the posterior longitudinal ligament contribute to severe spinal canal stenosis at C4-C5. Multilevel bony neural foraminal narrowing. Probable facet joint ankylosis on the right at T1-T2 and T2-T3. Upper chest: No consolidation within the imaged lung apices. No visible pneumothorax. IMPRESSION: CT head: 1. No evidence of acute intracranial abnormality. 2. Incidentally noted bilateral proptosis. CT cervical spine: 1. No evidence of acute fracture to the cervical spine. 2. Straightening of the expected cervical lordosis. 3. Cervical spondylosis and ossification of the posterior longitudinal ligament, as described. Spondylosis and prominent ossification of the posterior longitudinal ligament at C4-C5 result in severe spinal canal stenosis with suspected spinal cord impingement. A cervical spine MRI is recommended for further evaluation. Also of note, there are ventrolateral osteophytes throughout the cervical and visualized upper thoracic spine, some of which are bridging and many of which are prominent. These findings raise suspicion for diffuse idiopathic skeletal hyperostosis (DISH).  Suspected facet joint ankylosis on the right at T1-T2 and T2-T3. Electronically Signed   By: Kellie Simmering D.O.   On: 03/10/2021 11:11   CT Cervical Spine Wo Contrast  Result Date: 03/10/2021 CLINICAL DATA:  Head trauma, minor, normal mental status. Neck trauma, uncomplicated. Additional history provided: syncope (with head trauma) EXAM: CT HEAD WITHOUT CONTRAST CT CERVICAL SPINE WITHOUT CONTRAST TECHNIQUE: Multidetector CT  imaging of the head and cervical spine was performed following the standard protocol without intravenous contrast. Multiplanar CT image reconstructions of the cervical spine were also generated. COMPARISON:  None FINDINGS: CT HEAD FINDINGS Brain: Cerebral volume is normal for age. There is no acute intracranial hemorrhage. No demarcated cortical infarct. No extra-axial fluid collection. No evidence of an intracranial mass. No midline shift. Vascular: No hyperdense vessel. Atherosclerotic calcifications Skull: Normal. Negative for fracture or focal lesion. Sinuses/Orbits: Bilateral proptosis. Trace mucosal thickening within the bilateral ethmoid and right maxillary sinuses at the imaged levels. CT CERVICAL SPINE FINDINGS Alignment: Straightening of the expected cervical lordosis. No significant spondylolisthesis. Skull base and vertebrae: The basion-dental and atlanto-dental intervals are maintained.No evidence of acute fracture to the cervical spine. Soft tissues and spinal canal: No prevertebral fluid or swelling. No visible canal hematoma. Disc levels: Cervical spondylosis. No more than mild disc space narrowing. Multilevel shallow disc bulges, endplate spurring and uncovertebral hypertrophy. Ventrolateral osteophytes throughout the cervical and visualized upper thoracic spine, some of which are bridging. These osteophytes are most prominent at the C2-C3 through C7-T1 levels. A disc bulge, endplate spurring and prominent ossification of the posterior longitudinal ligament contribute to severe spinal canal stenosis at C4-C5. Multilevel bony neural foraminal narrowing. Probable facet joint ankylosis on the right at T1-T2 and T2-T3. Upper chest: No consolidation within the imaged lung apices. No visible pneumothorax. IMPRESSION: CT head: 1. No evidence of acute intracranial abnormality. 2. Incidentally noted bilateral proptosis. CT cervical spine: 1. No evidence of acute fracture to the cervical spine. 2.  Straightening of the expected cervical lordosis. 3. Cervical spondylosis and ossification of the posterior longitudinal ligament, as described. Spondylosis and prominent ossification of the posterior longitudinal ligament at C4-C5 result in severe spinal canal stenosis with suspected spinal cord impingement. A cervical spine MRI is recommended for further evaluation. Also of note, there are ventrolateral osteophytes throughout the cervical and visualized upper thoracic spine, some of which are bridging and many of which are prominent. These findings raise suspicion for diffuse idiopathic skeletal hyperostosis (DISH). Suspected facet joint ankylosis on the right at T1-T2 and T2-T3. Electronically Signed   By: Kellie Simmering D.O.   On: 03/10/2021 11:11   ECHOCARDIOGRAM COMPLETE  Result Date: 03/11/2021    ECHOCARDIOGRAM REPORT   Patient Name:   OLLIE ESTY [facility] Date of Exam: 03/11/2021 Medical Rec #:  [id number]       Height:       70.0 in Accession #:    [id number]      Weight:       226.0 lb Date of Birth:  1955/09/03       BSA:          2.199 m Patient Age:    65 years        BP:           138/90 mmHg Patient Gender: M               HR:           88 bpm. Exam Location:  Inpatient Procedure: 2D Echo,  Cardiac Doppler and Color Doppler Indications:    Syncope  History:        Patient has no prior history of Echocardiogram examinations.                 Risk Factors:Dyslipidemia. Nicotine addiction (From Hx), OSA                 (obstructive sleep apnea) (From Hx).  Sonographer:    Alvino Chapel RCS Referring Phys: [id number] [place]  1. Left ventricular ejection fraction, by estimation, is 60%. The left ventricle has normal function. Left ventricular endocardial border not optimally defined to evaluate regional wall motion. There is mild concentric left ventricular hypertrophy. Left  ventricular diastolic parameters are consistent with Grade I diastolic dysfunction (impaired relaxation).  2. Right ventricular  systolic function is normal. The right ventricular size is normal. Mildly increased right ventricular wall thickness. Tricuspid regurgitation signal is inadequate for assessing PA pressure.  3. The mitral valve is normal in structure. No evidence of mitral valve regurgitation. No evidence of mitral stenosis.  4. The aortic valve is tricuspid. Aortic valve regurgitation is not visualized. No aortic stenosis is present.  5. The inferior vena cava is normal in size with greater than 50% respiratory variability, suggesting right atrial pressure of 3 mmHg. Comparison(s): No prior Echocardiogram. FINDINGS  Left Ventricle: Left ventricular ejection fraction, by estimation, is 60%. The left ventricle has normal function. Left ventricular endocardial border not optimally defined to evaluate regional wall motion. Definity contrast agent was given IV to delineate the left ventricular endocardial borders. The left ventricular internal cavity size was normal in size. There is mild concentric left ventricular hypertrophy. Left ventricular diastolic parameters are consistent with Grade I diastolic dysfunction (impaired relaxation). Right Ventricle: The right ventricular size is normal. Mildly increased right ventricular wall thickness. Right ventricular systolic function is normal. Tricuspid regurgitation signal is inadequate for assessing PA pressure. Left Atrium: Left atrial size was normal in size. Right Atrium: Right atrial size was normal in size. Pericardium: Trivial pericardial effusion is present. Mitral Valve: The mitral valve is normal in structure. No evidence of mitral valve regurgitation. No evidence of mitral valve stenosis. Tricuspid Valve: The tricuspid valve is normal in structure. Tricuspid valve regurgitation is not demonstrated. No evidence of tricuspid stenosis. Aortic Valve: The aortic valve is tricuspid. Aortic valve regurgitation is not visualized. No aortic stenosis is present. Pulmonic Valve: The pulmonic  valve was not well visualized. Pulmonic valve regurgitation is not visualized. No evidence of pulmonic stenosis. Aorta: The aortic root is normal in size and structure. Venous: The inferior vena cava is normal in size with greater than 50% respiratory variability, suggesting right atrial pressure of 3 mmHg. IAS/Shunts: The atrial septum is grossly normal.  LEFT VENTRICLE PLAX 2D LVIDd:         4.70 cm   Diastology LVIDs:         3.10 cm   LV e' medial:    4.35 cm/s LV PW:         1.10 cm   LV E/e' medial:  11.4 LV IVS:        1.10 cm   LV e' lateral:   4.46 cm/s LVOT diam:     1.90 cm   LV E/e' lateral: 11.1 LV SV:         45 LV SV Index:   20 LVOT Area:     2.84 cm  RIGHT VENTRICLE RV S prime:  12.60 cm/s TAPSE (M-mode): 2.0 cm LEFT ATRIUM             Index        RIGHT ATRIUM           Index LA diam:        3.00 cm 1.36 cm/m   RA Area:     13.50 cm LA Vol (A2C):   43.6 ml 19.83 ml/m  RA Volume:   31.00 ml  14.10 ml/m LA Vol (A4C):   36.6 ml 16.65 ml/m LA Biplane Vol: 41.7 ml 18.97 ml/m  AORTIC VALVE LVOT Vmax:   90.60 cm/s LVOT Vmean:  55.000 cm/s LVOT VTI:    0.158 m  AORTA Ao Root diam: 3.80 cm MITRAL VALVE MV Area (PHT): 2.80 cm    SHUNTS MV Decel Time: 271 msec    Systemic VTI:  0.16 m MV E velocity: 49.60 cm/s  Systemic Diam: 1.90 cm MV A velocity: 60.90 cm/s MV E/A ratio:  0.81 Rudean Haskell MD Electronically signed by Rudean Haskell MD Signature Date/Time: 03/11/2021/5:09:47 PM    Final    VAS US CAROTID  Result Date: 03/11/2021 Carotid Arterial Duplex Study Patient Name:  CHRISTINA WALDROP [facility]  Date of Exam:   03/11/2021 Medical Rec #: [id number]        Accession #:    [id number] Date of Birth: Jan 15, 1956        Patient Gender: M Patient Age:   75 years Exam Location:  [facility] Procedure:      VAS US CAROTID Referring Phys: Annamaria Boots Marysue Fait --------------------------------------------------------------------------------  Indications:       Syncope. Risk Factors:      Hyperlipidemia,  Diabetes, past history of smoking. Comparison Study:  No prior study Performing Technologist: Sharion Dove RVS  Examination Guidelines: A complete evaluation includes B-mode imaging, spectral Doppler, color Doppler, and power Doppler as needed of all accessible portions of each vessel. Bilateral testing is considered an integral part of a complete examination. Limited examinations for reoccurring indications may be performed as noted.  Right Carotid Findings: +----------+--------+--------+--------+------------------+------------------+           PSV cm/sEDV cm/sStenosisPlaque DescriptionComments           +----------+--------+--------+--------+------------------+------------------+ CCA Prox  103     19                                intimal thickening +----------+--------+--------+--------+------------------+------------------+ CCA Distal87      13                                intimal thickening +----------+--------+--------+--------+------------------+------------------+ ICA Prox  42      17              calcific                             +----------+--------+--------+--------+------------------+------------------+ ICA Distal80      29                                                   +----------+--------+--------+--------+------------------+------------------+ ECA       81      7                                                    +----------+--------+--------+--------+------------------+------------------+ +----------+--------+-------+--------+-------------------+  PSV cm/sEDV cmsDescribeArm Pressure (mmHG) +----------+--------+-------+--------+-------------------+ [id number]                                         +----------+--------+-------+--------+-------------------+ +---------+--------+--+--------+-+ VertebralPSV cm/s33EDV cm/s6 +---------+--------+--+--------+-+  Left Carotid Findings:  +----------+--------+--------+--------+------------------+------------------+           PSV cm/sEDV cm/sStenosisPlaque DescriptionComments           +----------+--------+--------+--------+------------------+------------------+ CCA Prox  94      14                                intimal thickening +----------+--------+--------+--------+------------------+------------------+ CCA Distal71      17                                intimal thickening +----------+--------+--------+--------+------------------+------------------+ ICA Prox  53      20              calcific                             +----------+--------+--------+--------+------------------+------------------+ ICA Distal72      27                                                   +----------+--------+--------+--------+------------------+------------------+ ECA       80      13                                                   +----------+--------+--------+--------+------------------+------------------+ +----------+--------+--------+--------+-------------------+           PSV cm/sEDV cm/sDescribeArm Pressure (mmHG) +----------+--------+--------+--------+-------------------+ [id number]                                         +----------+--------+--------+--------+-------------------+ +---------+--------+--+--------+--+ VertebralPSV cm/s48EDV cm/s15 +---------+--------+--+--------+--+   Summary: Right Carotid: Velocities in the right ICA are consistent with a 1-39% stenosis. Left Carotid: Velocities in the left ICA are consistent with a 1-39% stenosis. Vertebrals:  Bilateral vertebral arteries demonstrate antegrade flow. Subclavians: Normal flow hemodynamics were seen in bilateral subclavian              arteries. *See table(s) above for measurements and observations.     Preliminary     Microbiology: Recent Results (from the past 240 hour(s))  Resp Panel by RT-PCR (Flu A&B, Covid) Nasopharyngeal  Swab     Status: None   Collection Time: 03/10/21  5:27 PM   Specimen: Nasopharyngeal Swab; Nasopharyngeal(NP) swabs in vial transport medium  Result Value Ref Range Status   SARS Coronavirus 2 by RT PCR NEGATIVE NEGATIVE Final    Comment: (NOTE) SARS-CoV-2 target nucleic acids are NOT DETECTED.  The SARS-CoV-2 RNA is generally detectable in upper respiratory specimens during the acute phase of infection. The lowest concentration of SARS-CoV-2 viral copies this assay can detect is 138 copies/mL. A negative result does not preclude SARS-Cov-2 infection and  should not be used as the sole basis for treatment or other patient management decisions. A negative result may occur with  improper specimen collection/handling, submission of specimen other than nasopharyngeal swab, presence of viral mutation(s) within the areas targeted by this assay, and inadequate number of viral copies(<138 copies/mL). A negative result must be combined with clinical observations, patient history, and epidemiological information. The expected result is Negative.  Fact Sheet for Patients:  EntrepreneurPulse.com.au  Fact Sheet for Healthcare Providers:  IncredibleEmployment.be  This test is no t yet approved or cleared by the [place] FDA and  has been authorized for detection and/or diagnosis of SARS-CoV-2 by FDA under an Emergency Use Authorization (EUA). This EUA will remain  in effect (meaning this test can be used) for the duration of the COVID-19 declaration under Section 564(b)(1) of the Act, 21 U.S.C.section 360bbb-3(b)(1), unless the authorization is terminated  or revoked sooner.       Influenza A by PCR NEGATIVE NEGATIVE Final   Influenza B by PCR NEGATIVE NEGATIVE Final    Comment: (NOTE) The Xpert Xpress SARS-CoV-2/FLU/RSV plus assay is intended as an aid in the diagnosis of influenza from Nasopharyngeal swab specimens and should not be used as a sole  basis for treatment. Nasal washings and aspirates are unacceptable for Xpert Xpress SARS-CoV-2/FLU/RSV testing.  Fact Sheet for Patients: EntrepreneurPulse.com.au  Fact Sheet for Healthcare Providers: IncredibleEmployment.be  This test is not yet approved or cleared by the [place] FDA and has been authorized for detection and/or diagnosis of SARS-CoV-2 by FDA under an Emergency Use Authorization (EUA). This EUA will remain in effect (meaning this test can be used) for the duration of the COVID-19 declaration under Section 564(b)(1) of the Act, 21 U.S.C. section 360bbb-3(b)(1), unless the authorization is terminated or revoked.  Performed at [facility], [address] [place], [place] [address]      Labs: Basic Metabolic Panel: Recent Labs  Lab 03/10/21 0937 03/11/21 0336  NA 137 137  K 4.1 3.2*  CL 100 101  CO2 26 26  GLUCOSE 158* 153*  BUN 17 19  CREATININE 1.11 1.02  1.02  CALCIUM 9.9 9.3   Liver Function Tests: No results for input(s): AST, ALT, ALKPHOS, BILITOT, PROT, ALBUMIN in the last 168 hours. No results for input(s): LIPASE, AMYLASE in the last 168 hours. No results for input(s): AMMONIA in the last 168 hours. CBC: Recent Labs  Lab 03/10/21 0937 03/11/21 0336  WBC 9.7 9.3  NEUTROABS 7.4  --   HGB 14.4 13.2  HCT 42.7 39.7  MCV 93.2 93.4  PLT 161 145*   Cardiac Enzymes: No results for input(s): CKTOTAL, CKMB, CKMBINDEX, TROPONINI in the last 168 hours. BNP: BNP (last 3 results) No results for input(s): BNP in the last 8760 hours.  ProBNP (last 3 results) No results for input(s): PROBNP in the last 8760 hours.  CBG: No results for input(s): GLUCAP in the last 168 hours.     Signed:  Florencia Reasons MD, PhD, FACP  [facility] 03/11/2021, 5:50 PM

## 2021-03-11 NOTE — Progress Notes (Signed)
VASCULAR LAB    Carotid duplex has been performed.  See CV proc for preliminary results.   Dakayla Disanti, RVT 03/11/2021, 2:11 PM

## 2021-03-11 NOTE — Progress Notes (Signed)
   Cardiology asked to arrange a cardiac monitor to further evaluate syncopal episode. 30-day monitor order placed. Patient has remotely seen Dr. Radford Pax in the past. Will route to Dr. Radford Pax to read as she is covering the hospital this weekend. Will send a message to schedulers to arrange a new patient appt with Dr. Radford Pax for follow-up of his monitor in 2 months.   Abigail Butts, PA-C 03/11/21; 9:17 AM

## 2021-03-11 NOTE — Progress Notes (Signed)
*  PRELIMINARY RESULTS* Echocardiogram 2D Echocardiogram has been performed with Definity.  Kyle Delacruz 03/11/2021, 4:55 PM

## 2021-03-11 NOTE — Evaluation (Signed)
Physical Therapy Evaluation Patient Details Name: Kyle Delacruz MRN: [id number] DOB: [birth year] Today's Date: 03/11/2021  History of Present Illness  pt is a 65 y/o male admitted 10/28 with worsening back pain following  a syncopal episode about 3 days PTA with LOC and head hitting the floor.  PMHx:  DJD, DM2, gout, OSA, bil THA, bil knee arthroscopy, rotator cuff repair.  Clinical Impression  Pt is close to baseline functioning and should be safe at home with PRN assist as back strain pain decreases. There are no further acute PT needs.  Will sign off at this time.        Recommendations for follow up therapy are one component of a multi-disciplinary discharge planning process, led by the attending physician.  Recommendations may be updated based on patient status, additional functional criteria and insurance authorization.  Follow Up Recommendations No PT follow up    Assistance Recommended at Discharge PRN  Functional Status Assessment    Equipment Recommendations  None recommended by PT    Recommendations for Other Services       Precautions / Restrictions Precautions Precautions: None      Mobility  Bed Mobility               General bed mobility comments: OOB in chair on arrival    Transfers Overall transfer level: Needs assistance   Transfers: Sit to/from Stand Sit to Stand: Min guard           General transfer comment: guarded, but no assist needed    Ambulation/Gait Ambulation/Gait assistance: Min guard Gait Distance (Feet): 250 Feet Assistive device: None Gait Pattern/deviations: Step-through pattern   Gait velocity interpretation: <1.8 ft/sec, indicate of risk for recurrent falls General Gait Details: steady, but guarded.  Stairs Stairs: Yes Stairs assistance: Supervision Stair Management: One rail Right;Alternating pattern;Forwards Number of Stairs: 4 General stair comments: safe with rail  Wheelchair Mobility    Modified Rankin  (Stroke Patients Only)       Balance Overall balance assessment: No apparent balance deficits (not formally assessed)                                           Pertinent Vitals/Pain Pain Assessment: Faces Faces Pain Scale: Hurts even more Pain Location: mid back bil flanks Pain Descriptors / Indicators: Aching;Sharp;Sore Pain Intervention(s): Monitored during session;Heat applied    Home Living Family/patient expects to be discharged to:: Private residence Living Arrangements: Spouse/significant other;Other relatives Available Help at Discharge: Family;Available PRN/intermittently Type of Home: House Home Access: Stairs to enter Entrance Stairs-Rails: Right;Left Entrance Stairs-Number of Steps: 3 Alternate Level Stairs-Number of Steps: flight Home Layout: Two level;Bed/bath upstairs Home Equipment: Grab bars - toilet;Grab bars - tub/shower;Cane - single point;Shower seat      Prior Function Prior Level of Function : Independent/Modified Independent                     Hand Dominance        Extremity/Trunk Assessment   Upper Extremity Assessment Upper Extremity Assessment: Overall WFL for tasks assessed    Lower Extremity Assessment Lower Extremity Assessment: Overall WFL for tasks assessed    Cervical / Trunk Assessment Cervical / Trunk Assessment: Other exceptions (flexed posture due to back strain)  Communication   Communication: No difficulties  Cognition Arousal/Alertness: Awake/alert Behavior During Therapy: WFL for tasks assessed/performed Overall Cognitive  Status: Within Functional Limits for tasks assessed                                          General Comments General comments (skin integrity, edema, etc.): vss, no SOB, guarded posture    Exercises     Assessment/Plan    PT Assessment Patient does not need any further PT services  PT Problem List         PT Treatment Interventions      PT  Goals (Current goals can be found in the Care Plan section)  Acute Rehab PT Goals Patient Stated Goal: home with less pain PT Goal Formulation: All assessment and education complete, DC therapy    Frequency     Barriers to discharge        Co-evaluation               AM-PAC PT "6 Clicks" Mobility  Outcome Measure Help needed turning from your back to your side while in a flat bed without using bedrails?: A Little Help needed moving from lying on your back to sitting on the side of a flat bed without using bedrails?: A Little Help needed moving to and from a bed to a chair (including a wheelchair)?: A Little Help needed standing up from a chair using your arms (e.g., wheelchair or bedside chair)?: A Little Help needed to walk in hospital room?: A Little Help needed climbing 3-5 steps with a railing? : A Little 6 Click Score: 18    End of Session   Activity Tolerance: Patient tolerated treatment well;Patient limited by pain Patient left: in chair;with call bell/phone within reach;with family/visitor present Nurse Communication: Mobility status PT Visit Diagnosis: Pain;Other abnormalities of gait and mobility (R26.89) Pain - part of body:  (back)    Time: 6381-7711 PT Time Calculation (min) (ACUTE ONLY): 37 min   Charges:   PT Evaluation $PT Eval Low Complexity: 1 Low PT Treatments $Gait Training: 8-22 mins        03/11/2021  Kyle Delacruz., PT Acute Rehabilitation Services [phone]  (pager) [phone]  (office)  Kyle Delacruz 03/11/2021, 6:35 PM

## 2021-03-12 NOTE — Progress Notes (Signed)
Discharge instructions reviewed and given, pt/wife acknowledge understanding.

## 2021-03-22 DIAGNOSIS — E669 Obesity, unspecified: Secondary | ICD-10-CM | POA: Diagnosis not present

## 2021-03-22 DIAGNOSIS — M109 Gout, unspecified: Secondary | ICD-10-CM | POA: Diagnosis not present

## 2021-03-22 DIAGNOSIS — M4802 Spinal stenosis, cervical region: Secondary | ICD-10-CM | POA: Diagnosis not present

## 2021-03-22 DIAGNOSIS — C61 Malignant neoplasm of prostate: Secondary | ICD-10-CM | POA: Diagnosis not present

## 2021-03-22 DIAGNOSIS — E785 Hyperlipidemia, unspecified: Secondary | ICD-10-CM | POA: Diagnosis not present

## 2021-03-22 DIAGNOSIS — N289 Disorder of kidney and ureter, unspecified: Secondary | ICD-10-CM | POA: Diagnosis not present

## 2021-03-22 DIAGNOSIS — E119 Type 2 diabetes mellitus without complications: Secondary | ICD-10-CM | POA: Diagnosis not present

## 2021-03-22 DIAGNOSIS — E039 Hypothyroidism, unspecified: Secondary | ICD-10-CM | POA: Diagnosis not present

## 2021-03-22 DIAGNOSIS — I10 Essential (primary) hypertension: Secondary | ICD-10-CM | POA: Diagnosis not present

## 2021-03-22 DIAGNOSIS — R55 Syncope and collapse: Secondary | ICD-10-CM | POA: Diagnosis not present

## 2021-03-22 DIAGNOSIS — G4733 Obstructive sleep apnea (adult) (pediatric): Secondary | ICD-10-CM | POA: Diagnosis not present

## 2021-03-28 ENCOUNTER — Ambulatory Visit (INDEPENDENT_AMBULATORY_CARE_PROVIDER_SITE_OTHER): Payer: Medicare Other

## 2021-03-28 DIAGNOSIS — R55 Syncope and collapse: Secondary | ICD-10-CM | POA: Diagnosis not present

## 2021-04-10 ENCOUNTER — Encounter (HOSPITAL_COMMUNITY): Payer: Self-pay | Admitting: Radiology

## 2021-04-19 DIAGNOSIS — M25532 Pain in left wrist: Secondary | ICD-10-CM | POA: Diagnosis not present

## 2021-04-19 DIAGNOSIS — M19012 Primary osteoarthritis, left shoulder: Secondary | ICD-10-CM | POA: Diagnosis not present

## 2021-04-19 DIAGNOSIS — M654 Radial styloid tenosynovitis [de Quervain]: Secondary | ICD-10-CM | POA: Diagnosis not present

## 2021-04-24 ENCOUNTER — Ambulatory Visit: Payer: Medicare Other | Admitting: Cardiology

## 2021-04-24 DIAGNOSIS — M48062 Spinal stenosis, lumbar region with neurogenic claudication: Secondary | ICD-10-CM | POA: Diagnosis not present

## 2021-04-24 DIAGNOSIS — M9931 Osseous stenosis of neural canal of cervical region: Secondary | ICD-10-CM | POA: Diagnosis not present

## 2021-04-24 DIAGNOSIS — M545 Low back pain, unspecified: Secondary | ICD-10-CM | POA: Diagnosis not present

## 2021-04-25 ENCOUNTER — Other Ambulatory Visit: Payer: Self-pay | Admitting: Rehabilitation

## 2021-04-25 DIAGNOSIS — M542 Cervicalgia: Secondary | ICD-10-CM

## 2021-04-25 DIAGNOSIS — M5416 Radiculopathy, lumbar region: Secondary | ICD-10-CM

## 2021-04-26 ENCOUNTER — Other Ambulatory Visit: Payer: Self-pay | Admitting: Rehabilitation

## 2021-04-26 DIAGNOSIS — M48062 Spinal stenosis, lumbar region with neurogenic claudication: Secondary | ICD-10-CM

## 2021-05-02 DIAGNOSIS — M654 Radial styloid tenosynovitis [de Quervain]: Secondary | ICD-10-CM | POA: Diagnosis not present

## 2021-05-15 NOTE — Progress Notes (Signed)
Cardiology Office Note:    Date:  05/17/2021   ID:  Kyle Delacruz, DOB 12/09/1955, MRN [id number]  PCP:  Prince Solian, MD  Cardiologist:  None   Referring MD: Prince Solian, MD   Chief Complaint  Patient presents with   Loss of Consciousness    History of Present Illness:    Kyle Delacruz is a 66 y.o. male with a hx of tobacco abuse, OSA, HL, FHx of CAD, hypothyroidism, GERD, DM II, and recent syncope..   Since discharge from the hospital, he has had no recurrence of syncope.  Overall, he has done well.  He does admit to having sleep apnea but not being compliant with therapy/CPAP.  He completed the 30-day monitor  Past Medical History:  Diagnosis Date   Allergy    Arthritis    osteo   CTS (carpal tunnel syndrome)    DJD (degenerative joint disease)    DM2 (diabetes mellitus, type 2) (HCC)    GERD (gastroesophageal reflux disease)    Gout    History of adenomatous polyps of colon 12/29/2019   History of nuclear stress test    Myoview 11/17: EF 54, no ST changes, hypertensive blood pressure response, no ischemia, low risk   Hyperlipidemia    Hypothyroidism    Nicotine addiction    OSA (obstructive sleep apnea)    Prostatitis    Reactive airway disease    Sarcocystosis    Sinus arrhythmia    Sleep apnea    cpap- not wearing   Tinea corporis     Past Surgical History:  Procedure Laterality Date   BILATERAL KNEE ARTHROSCOPY     right- 1997; left 2001   COLONOSCOPY  multiple last 2013   KNEE SURGERY Right 2014   torn R medial meniscus- arthroscopy done   PARATHYROIDECTOMY  2005   ROTATOR CUFF REPAIR  2004   right   TONSILLECTOMY     TOTAL HIP ARTHROPLASTY Left 2009   TOTAL HIP ARTHROPLASTY Right 2006   UPPER GASTROINTESTINAL ENDOSCOPY      Current Medications: Current Meds  Medication Sig   albuterol (VENTOLIN HFA) 108 (90 Base) MCG/ACT inhaler Inhale 2 puffs into the lungs every 4 (four) hours.   allopurinol (ZYLOPRIM) 300 MG tablet Take 300  mg by mouth daily.    chlorpheniramine-HYDROcodone (TUSSIONEX) 10-8 MG/5ML SUER Take 5 mLs by mouth every 12 (twelve) hours as needed for cough.   CRESTOR 20 MG tablet Take 20 mg by mouth daily.    diazepam (VALIUM) 5 MG tablet Take 5 mg by mouth as needed.   fluticasone (FLONASE) 50 MCG/ACT nasal spray Place 1 spray into the nose 2 (two) times daily as needed for allergies.   levothyroxine (SYNTHROID, LEVOTHROID) 50 MCG tablet Take 50 mcg by mouth daily.   methylPREDNISolone (MEDROL DOSEPAK) 4 MG TBPK tablet Take 4-20 mg by mouth See admin instructions.   naproxen sodium (ALEVE) 220 MG tablet Take 440 mg by mouth 2 (two) times daily as needed (pain).   omeprazole (PRILOSEC) 40 MG capsule Take 40 mg by mouth 2 (two) times daily.   SPIRIVA HANDIHALER 18 MCG inhalation capsule Place 1 capsule into inhaler and inhale daily.   WIXELA INHUB 250-50 MCG/ACT AEPB Inhale 1 puff into the lungs 2 (two) times daily.     Allergies:   Iohexol, Penicillins, and Shellfish allergy   Social History   Socioeconomic History   Marital status: Married    Spouse name: Not on file  Number of children: 0   Years of education: Not on file   Highest education level: Not on file  Occupational History   Occupation: retired postal  Tobacco Use   Smoking status: Former    Packs/day: 0.50    Types: Cigarettes   Smokeless tobacco: Never   Tobacco comments:    Quit March 2020  Vaping Use   Vaping Use: Never used  Substance and Sexual Activity   Alcohol use: Yes    Alcohol/week: 21.0 standard drinks    Types: 21 Cans of beer per week    Comment: 14 cans a week   Drug use: No   Sexual activity: Not on file  Other Topics Concern   Not on file  Social History Narrative   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 There is no history of Colon cancer, Stomach cancer, Esophageal cancer, Rectal cancer, or Prostate cancer.  ROS:   Please see the history of present illness.    Does not use CPAP as prescribed.  Stop smoking 2 years ago.  All other systems reviewed and are negative.  EKGs/Labs/Other Studies Reviewed:    The following studies were reviewed today:  ECHOCARDIOGRAM 02/2021: IMPRESSIONS   1. Left ventricular ejection fraction, by estimation, is 60%. The left  ventricle has normal function. Left ventricular endocardial border not  optimally defined to evaluate regional wall motion. There is mild  concentric left ventricular hypertrophy. Left   ventricular diastolic parameters are consistent with Grade I diastolic  dysfunction (impaired relaxation).   2. Right ventricular systolic function is normal. The right ventricular  size is normal. Mildly increased right ventricular wall thickness.  Tricuspid regurgitation signal is inadequate for assessing PA pressure.   3. The mitral valve is normal in structure. No evidence of mitral valve  regurgitation. No evidence of mitral stenosis.   4. The aortic valve is tricuspid. Aortic valve regurgitation is not  visualized. No aortic stenosis is present.   5. The inferior vena cava is normal in size with greater than 50%  respiratory variability, suggesting right atrial pressure of 3 mmHg.   Comparison(s): No prior Echocardiogram.   30-day Monitor 2022:  Study Highlights    Basic underlying rhythm is normal sinus rhythm with heart rate range 52 to 154 bpm PVC burden 1% PAC burden 3% No symptoms  or events during the 7-day period of monitoring   Overall normal study with low grade burden of PACs and PVCs.    EKG:  EKG performed on March 10, 2021 demonstrates normal sinus rhythm with frequent PACs.  Recent Labs: 03/11/2021: BUN 19; Creatinine, Ser 1.02; Creatinine, Ser 1.02; Hemoglobin 13.2; Platelets 145; Potassium 3.2; Sodium 137  Recent Lipid Panel No results found for: CHOL, TRIG, HDL, CHOLHDL, VLDL, LDLCALC, LDLDIRECT  Physical Exam:    VS:  BP 100/64    Pulse 87    Ht 5\' 10"  (1.778  m)    Wt 235 lb (106.6 kg)    SpO2 98%    BMI 33.72 kg/m     Wt Readings from Last 3 Encounters:  05/17/21 235 lb (106.6 kg)  12/21/19 226 lb (102.5 kg)  11/17/19 226 lb (102.5 kg)     GEN: Overweight. No acute distress HEENT: Normal NECK: No JVD. LYMPHATICS: No lymphadenopathy CARDIAC: No murmur. RRR no gallop, or edema. VASCULAR:  Normal Pulses. No bruits. RESPIRATORY:  Clear to auscultation without rales, wheezing or rhonchi  ABDOMEN: Soft, non-tender, non-distended, No pulsatile mass, MUSCULOSKELETAL: No deformity  SKIN: Warm and dry NEUROLOGIC:  Alert and oriented x 3 PSYCHIATRIC:  Normal affect   ASSESSMENT:    1. Syncope, unspecified syncope type   2. Mixed hyperlipidemia   3. Chronic diastolic heart failure ([place])   4. OSA (obstructive sleep apnea)   5. DM (diabetes mellitus) type 2, uncontrolled (HCC)    PLAN:    In order of problems listed above:  Unknown etiology.  No symptoms to suggest coronary disease.  LV function is intact.  Monitor did not reveal significant bradycardia or sustained tacky arrhythmias.  Clinical observation is recommended. Continue statin therapy No symptoms of heart failure. Encouraged the patient to resume therapy if sleep apnea. Encouraged the patient to be mindful of hemoglobin A1c and we may need to consider adding an SGLT2.   Medication Adjustments/Labs and Tests Ordered: Current medicines are reviewed at length with the patient  today.  Concerns regarding medicines are outlined above.  No orders of the defined types were placed in this encounter.  No orders of the defined types were placed in this encounter.   Patient Instructions  Medication Instructions:  Your physician recommends that you continue on your current medications as directed. Please refer to the Current Medication list given to you today.  *If you need a refill on your cardiac medications before your next appointment, please call your pharmacy*   Lab Work: None If you have labs (blood work) drawn today and your tests are completely normal, you will receive your results only by: [place] (if you have MyChart) OR A paper copy in the mail If you have any lab test that is abnormal or we need to change your treatment, we will call you to review the results.   Testing/Procedures: None   Follow-Up: At [facility], you and your health needs are our priority.  As part of our continuing mission to provide you with exceptional heart care, we have created designated Provider Care Teams.  These Care Teams include your primary Cardiologist (physician) and Advanced Practice Providers (APPs -  Physician Assistants and Nurse Practitioners) who all work together to provide you with the care you need, when you need it.  We recommend signing up for the patient portal called "MyChart".  Sign up information is provided on this After Visit Summary.  MyChart is used to connect with patients for Virtual Visits (Telemedicine).  Patients are able to view lab/test results, encounter notes, upcoming appointments, etc.  Non-urgent messages can be sent to your provider as well.   To learn more about what you can do with MyChart, go to [email or web address].    Your next appointment:   As needed  The format for your next appointment:   In Person  Provider:   Farrel Gordon, MD   Other Instructions     Signed, Sinclair Grooms, MD  05/17/2021  12:50 PM    [facility]

## 2021-05-17 ENCOUNTER — Encounter: Payer: Self-pay | Admitting: Interventional Cardiology

## 2021-05-17 ENCOUNTER — Other Ambulatory Visit: Payer: Self-pay

## 2021-05-17 ENCOUNTER — Ambulatory Visit (INDEPENDENT_AMBULATORY_CARE_PROVIDER_SITE_OTHER): Payer: Medicare Other | Admitting: Interventional Cardiology

## 2021-05-17 VITALS — BP 100/64 | HR 87 | Ht 70.0 in | Wt 235.0 lb

## 2021-05-17 DIAGNOSIS — I5032 Chronic diastolic (congestive) heart failure: Secondary | ICD-10-CM | POA: Diagnosis not present

## 2021-05-17 DIAGNOSIS — G4733 Obstructive sleep apnea (adult) (pediatric): Secondary | ICD-10-CM

## 2021-05-17 DIAGNOSIS — R55 Syncope and collapse: Secondary | ICD-10-CM

## 2021-05-17 DIAGNOSIS — E782 Mixed hyperlipidemia: Secondary | ICD-10-CM | POA: Diagnosis not present

## 2021-05-17 DIAGNOSIS — E1110 Type 2 diabetes mellitus with ketoacidosis without coma: Secondary | ICD-10-CM | POA: Diagnosis not present

## 2021-05-17 NOTE — Patient Instructions (Signed)
Medication Instructions:  Your physician recommends that you continue on your current medications as directed. Please refer to the Current Medication list given to you today.  *If you need a refill on your cardiac medications before your next appointment, please call your pharmacy*   Lab Work: None If you have labs (blood work) drawn today and your tests are completely normal, you will receive your results only by: [place] (if you have MyChart) OR A paper copy in the mail If you have any lab test that is abnormal or we need to change your treatment, we will call you to review the results.   Testing/Procedures: None   Follow-Up: At [facility], you and your health needs are our priority.  As part of our continuing mission to provide you with exceptional heart care, we have created designated Provider Care Teams.  These Care Teams include your primary Cardiologist (physician) and Advanced Practice Providers (APPs -  Physician Assistants and Nurse Practitioners) who all work together to provide you with the care you need, when you need it.  We recommend signing up for the patient portal called "MyChart".  Sign up information is provided on this After Visit Summary.  MyChart is used to connect with patients for Virtual Visits (Telemedicine).  Patients are able to view lab/test results, encounter notes, upcoming appointments, etc.  Non-urgent messages can be sent to your provider as well.   To learn more about what you can do with MyChart, go to [email or web address].    Your next appointment:   As needed  The format for your next appointment:   In Person  Provider:   Farrel Gordon, MD   Other Instructions

## 2021-05-24 ENCOUNTER — Ambulatory Visit
Admission: RE | Admit: 2021-05-24 | Discharge: 2021-05-24 | Disposition: A | Discharge status: Home / Self Care | Payer: Medicare Other | Source: Ambulatory Visit | Attending: Rehabilitation | Admitting: Rehabilitation

## 2021-05-24 ENCOUNTER — Other Ambulatory Visit: Payer: Self-pay

## 2021-05-24 DIAGNOSIS — M48061 Spinal stenosis, lumbar region without neurogenic claudication: Secondary | ICD-10-CM | POA: Diagnosis not present

## 2021-05-24 DIAGNOSIS — M2578 Osteophyte, vertebrae: Secondary | ICD-10-CM | POA: Diagnosis not present

## 2021-05-24 DIAGNOSIS — M4802 Spinal stenosis, cervical region: Secondary | ICD-10-CM | POA: Diagnosis not present

## 2021-05-24 DIAGNOSIS — M48062 Spinal stenosis, lumbar region with neurogenic claudication: Secondary | ICD-10-CM

## 2021-05-24 DIAGNOSIS — M542 Cervicalgia: Secondary | ICD-10-CM | POA: Diagnosis not present

## 2021-05-24 DIAGNOSIS — M545 Low back pain, unspecified: Secondary | ICD-10-CM | POA: Diagnosis not present

## 2021-05-30 DIAGNOSIS — Z6833 Body mass index (BMI) 33.0-33.9, adult: Secondary | ICD-10-CM | POA: Diagnosis not present

## 2021-05-30 DIAGNOSIS — M4712 Other spondylosis with myelopathy, cervical region: Secondary | ICD-10-CM | POA: Diagnosis not present

## 2021-05-30 DIAGNOSIS — M4802 Spinal stenosis, cervical region: Secondary | ICD-10-CM | POA: Diagnosis not present

## 2021-05-30 DIAGNOSIS — M4722 Other spondylosis with radiculopathy, cervical region: Secondary | ICD-10-CM | POA: Diagnosis not present

## 2021-06-07 ENCOUNTER — Telehealth: Payer: Self-pay | Admitting: *Deleted

## 2021-06-07 NOTE — Telephone Encounter (Signed)
° °  Name: Kyle Delacruz  DOB: 04/14/1956  MRN: [id number]   Primary Cardiologist: Sinclair Grooms, MD  Chart reviewed as part of pre-operative protocol coverage. Patient was contacted 06/07/2021 in reference to pre-operative risk assessment for pending surgery as outlined below.  Kyle Delacruz was last seen on 05/17/21 by Dr. Tamala Julian.  Since that day, Kyle Delacruz has done well.  He does not have a history of ischemic heart disease. Heart monitor was unrevealing. He can complete more than 4.0 METS without angina.   Therefore, based on ACC/AHA guidelines, the patient would be at acceptable risk for the planned procedure without further cardiovascular testing.   The patient was advised that if he develops new symptoms prior to surgery to contact our office to arrange for a follow-up visit, and he verbalized understanding.  I will route this recommendation to the requesting party via Epic fax function and remove from pre-op pool. Please call with questions.  Ledora Bottcher, PA 06/07/2021, 4:38 PM

## 2021-06-07 NOTE — Telephone Encounter (Signed)
° °  Pre-operative Risk Assessment    Patient Name: Kyle Delacruz  DOB: 1955-12-17 MRN: [id number]      Request for Surgical Clearance    Procedure:   C3-7 LAMINOPLASTY  Date of Surgery:  Clearance TBD                                 Surgeon:  DR. Ivan Croft Surgeon's Group or Practice Name:  [facility] Phone number:  [phone] Fax number:  [phone]   Type of Clearance Requested:   - Medical    Type of Anesthesia:  General    Additional requests/questions:    Jiles Prows   06/07/2021, 12:45 PM

## 2021-07-07 DIAGNOSIS — M4802 Spinal stenosis, cervical region: Secondary | ICD-10-CM | POA: Diagnosis not present

## 2021-07-07 DIAGNOSIS — Z6834 Body mass index (BMI) 34.0-34.9, adult: Secondary | ICD-10-CM | POA: Diagnosis not present

## 2021-07-10 DIAGNOSIS — Z01810 Encounter for preprocedural cardiovascular examination: Secondary | ICD-10-CM | POA: Diagnosis not present

## 2021-07-10 DIAGNOSIS — Z01818 Encounter for other preprocedural examination: Secondary | ICD-10-CM | POA: Diagnosis not present

## 2021-07-10 DIAGNOSIS — I5032 Chronic diastolic (congestive) heart failure: Secondary | ICD-10-CM | POA: Diagnosis not present

## 2021-07-10 DIAGNOSIS — M4722 Other spondylosis with radiculopathy, cervical region: Secondary | ICD-10-CM | POA: Diagnosis not present

## 2021-07-10 DIAGNOSIS — I498 Other specified cardiac arrhythmias: Secondary | ICD-10-CM | POA: Diagnosis not present

## 2021-07-17 DIAGNOSIS — Z79899 Other long term (current) drug therapy: Secondary | ICD-10-CM | POA: Diagnosis not present

## 2021-07-17 DIAGNOSIS — M4812 Ankylosing hyperostosis [Forestier], cervical region: Secondary | ICD-10-CM | POA: Diagnosis not present

## 2021-07-17 DIAGNOSIS — K219 Gastro-esophageal reflux disease without esophagitis: Secondary | ICD-10-CM | POA: Diagnosis not present

## 2021-07-17 DIAGNOSIS — Z96643 Presence of artificial hip joint, bilateral: Secondary | ICD-10-CM | POA: Diagnosis not present

## 2021-07-17 DIAGNOSIS — G9589 Other specified diseases of spinal cord: Secondary | ICD-10-CM | POA: Diagnosis not present

## 2021-07-17 DIAGNOSIS — J449 Chronic obstructive pulmonary disease, unspecified: Secondary | ICD-10-CM | POA: Diagnosis not present

## 2021-07-17 DIAGNOSIS — Z885 Allergy status to narcotic agent status: Secondary | ICD-10-CM | POA: Diagnosis not present

## 2021-07-17 DIAGNOSIS — Z7982 Long term (current) use of aspirin: Secondary | ICD-10-CM | POA: Diagnosis not present

## 2021-07-17 DIAGNOSIS — M4802 Spinal stenosis, cervical region: Secondary | ICD-10-CM | POA: Diagnosis not present

## 2021-07-17 DIAGNOSIS — G9529 Other cord compression: Secondary | ICD-10-CM | POA: Diagnosis not present

## 2021-07-17 DIAGNOSIS — M47817 Spondylosis without myelopathy or radiculopathy, lumbosacral region: Secondary | ICD-10-CM | POA: Diagnosis not present

## 2021-07-17 DIAGNOSIS — M2578 Osteophyte, vertebrae: Secondary | ICD-10-CM | POA: Diagnosis not present

## 2021-07-17 DIAGNOSIS — G8929 Other chronic pain: Secondary | ICD-10-CM | POA: Diagnosis not present

## 2021-07-17 DIAGNOSIS — M4712 Other spondylosis with myelopathy, cervical region: Secondary | ICD-10-CM | POA: Diagnosis not present

## 2021-07-17 DIAGNOSIS — Z9889 Other specified postprocedural states: Secondary | ICD-10-CM | POA: Diagnosis not present

## 2021-07-17 DIAGNOSIS — M109 Gout, unspecified: Secondary | ICD-10-CM | POA: Diagnosis not present

## 2021-07-17 DIAGNOSIS — E039 Hypothyroidism, unspecified: Secondary | ICD-10-CM | POA: Diagnosis not present

## 2021-07-17 DIAGNOSIS — K589 Irritable bowel syndrome without diarrhea: Secondary | ICD-10-CM | POA: Diagnosis not present

## 2021-07-17 DIAGNOSIS — M542 Cervicalgia: Secondary | ICD-10-CM | POA: Diagnosis not present

## 2021-07-17 DIAGNOSIS — M4602 Spinal enthesopathy, cervical region: Secondary | ICD-10-CM | POA: Diagnosis not present

## 2021-07-17 DIAGNOSIS — E785 Hyperlipidemia, unspecified: Secondary | ICD-10-CM | POA: Diagnosis not present

## 2021-07-17 DIAGNOSIS — Z6834 Body mass index (BMI) 34.0-34.9, adult: Secondary | ICD-10-CM | POA: Diagnosis not present

## 2021-07-17 DIAGNOSIS — M4722 Other spondylosis with radiculopathy, cervical region: Secondary | ICD-10-CM | POA: Diagnosis not present

## 2021-07-17 DIAGNOSIS — F17210 Nicotine dependence, cigarettes, uncomplicated: Secondary | ICD-10-CM | POA: Diagnosis not present

## 2021-07-17 DIAGNOSIS — E669 Obesity, unspecified: Secondary | ICD-10-CM | POA: Diagnosis not present

## 2021-07-17 DIAGNOSIS — M48061 Spinal stenosis, lumbar region without neurogenic claudication: Secondary | ICD-10-CM | POA: Diagnosis not present

## 2021-07-17 DIAGNOSIS — Z8546 Personal history of malignant neoplasm of prostate: Secondary | ICD-10-CM | POA: Diagnosis not present

## 2021-07-17 DIAGNOSIS — E119 Type 2 diabetes mellitus without complications: Secondary | ICD-10-CM | POA: Diagnosis not present

## 2021-07-26 ENCOUNTER — Emergency Department (HOSPITAL_BASED_OUTPATIENT_CLINIC_OR_DEPARTMENT_OTHER): Payer: Medicare Other

## 2021-07-26 ENCOUNTER — Encounter (HOSPITAL_BASED_OUTPATIENT_CLINIC_OR_DEPARTMENT_OTHER): Payer: Self-pay | Admitting: Emergency Medicine

## 2021-07-26 ENCOUNTER — Other Ambulatory Visit: Payer: Self-pay

## 2021-07-26 ENCOUNTER — Inpatient Hospital Stay (HOSPITAL_BASED_OUTPATIENT_CLINIC_OR_DEPARTMENT_OTHER)
Admission: EM | Admit: 2021-07-26 | Discharge: 2021-07-28 | DRG: 175 | Disposition: A | Discharge status: Home / Self Care | Payer: Medicare Other | Attending: Internal Medicine | Admitting: Internal Medicine

## 2021-07-26 DIAGNOSIS — I2609 Other pulmonary embolism with acute cor pulmonale: Secondary | ICD-10-CM | POA: Diagnosis not present

## 2021-07-26 DIAGNOSIS — Z79890 Hormone replacement therapy (postmenopausal): Secondary | ICD-10-CM

## 2021-07-26 DIAGNOSIS — I82431 Acute embolism and thrombosis of right popliteal vein: Secondary | ICD-10-CM | POA: Diagnosis present

## 2021-07-26 DIAGNOSIS — I82411 Acute embolism and thrombosis of right femoral vein: Secondary | ICD-10-CM | POA: Diagnosis present

## 2021-07-26 DIAGNOSIS — Z91013 Allergy to seafood: Secondary | ICD-10-CM | POA: Diagnosis not present

## 2021-07-26 DIAGNOSIS — Z833 Family history of diabetes mellitus: Secondary | ICD-10-CM

## 2021-07-26 DIAGNOSIS — J449 Chronic obstructive pulmonary disease, unspecified: Secondary | ICD-10-CM | POA: Diagnosis present

## 2021-07-26 DIAGNOSIS — C61 Malignant neoplasm of prostate: Secondary | ICD-10-CM | POA: Diagnosis not present

## 2021-07-26 DIAGNOSIS — Z880 Allergy status to penicillin: Secondary | ICD-10-CM

## 2021-07-26 DIAGNOSIS — Z8546 Personal history of malignant neoplasm of prostate: Secondary | ICD-10-CM | POA: Diagnosis not present

## 2021-07-26 DIAGNOSIS — E039 Hypothyroidism, unspecified: Secondary | ICD-10-CM | POA: Diagnosis present

## 2021-07-26 DIAGNOSIS — Z87891 Personal history of nicotine dependence: Secondary | ICD-10-CM

## 2021-07-26 DIAGNOSIS — K219 Gastro-esophageal reflux disease without esophagitis: Secondary | ICD-10-CM | POA: Diagnosis present

## 2021-07-26 DIAGNOSIS — R0602 Shortness of breath: Secondary | ICD-10-CM | POA: Diagnosis not present

## 2021-07-26 DIAGNOSIS — I10 Essential (primary) hypertension: Secondary | ICD-10-CM | POA: Diagnosis not present

## 2021-07-26 DIAGNOSIS — M7989 Other specified soft tissue disorders: Secondary | ICD-10-CM | POA: Diagnosis not present

## 2021-07-26 DIAGNOSIS — M109 Gout, unspecified: Secondary | ICD-10-CM | POA: Diagnosis present

## 2021-07-26 DIAGNOSIS — Z79899 Other long term (current) drug therapy: Secondary | ICD-10-CM

## 2021-07-26 DIAGNOSIS — Z91030 Bee allergy status: Secondary | ICD-10-CM | POA: Diagnosis not present

## 2021-07-26 DIAGNOSIS — E785 Hyperlipidemia, unspecified: Secondary | ICD-10-CM | POA: Diagnosis present

## 2021-07-26 DIAGNOSIS — J9811 Atelectasis: Secondary | ICD-10-CM | POA: Diagnosis present

## 2021-07-26 DIAGNOSIS — J9601 Acute respiratory failure with hypoxia: Secondary | ICD-10-CM

## 2021-07-26 DIAGNOSIS — G4733 Obstructive sleep apnea (adult) (pediatric): Secondary | ICD-10-CM | POA: Diagnosis present

## 2021-07-26 DIAGNOSIS — Z823 Family history of stroke: Secondary | ICD-10-CM

## 2021-07-26 DIAGNOSIS — M79661 Pain in right lower leg: Secondary | ICD-10-CM | POA: Diagnosis not present

## 2021-07-26 DIAGNOSIS — Z20822 Contact with and (suspected) exposure to covid-19: Secondary | ICD-10-CM | POA: Diagnosis present

## 2021-07-26 DIAGNOSIS — Z888 Allergy status to other drugs, medicaments and biological substances status: Secondary | ICD-10-CM

## 2021-07-26 DIAGNOSIS — Z86010 Personal history of colonic polyps: Secondary | ICD-10-CM | POA: Diagnosis not present

## 2021-07-26 DIAGNOSIS — Z9889 Other specified postprocedural states: Secondary | ICD-10-CM

## 2021-07-26 DIAGNOSIS — E119 Type 2 diabetes mellitus without complications: Secondary | ICD-10-CM | POA: Diagnosis present

## 2021-07-26 DIAGNOSIS — Z8249 Family history of ischemic heart disease and other diseases of the circulatory system: Secondary | ICD-10-CM | POA: Diagnosis not present

## 2021-07-26 DIAGNOSIS — I82401 Acute embolism and thrombosis of unspecified deep veins of right lower extremity: Secondary | ICD-10-CM

## 2021-07-26 DIAGNOSIS — I2699 Other pulmonary embolism without acute cor pulmonale: Principal | ICD-10-CM | POA: Diagnosis present

## 2021-07-26 DIAGNOSIS — M1990 Unspecified osteoarthritis, unspecified site: Secondary | ICD-10-CM | POA: Diagnosis present

## 2021-07-26 DIAGNOSIS — Z96643 Presence of artificial hip joint, bilateral: Secondary | ICD-10-CM | POA: Diagnosis present

## 2021-07-26 LAB — CBC
HCT: 39.7 % (ref 39.0–52.0)
Hemoglobin: 13.1 g/dL (ref 13.0–17.0)
MCH: 30.1 pg (ref 26.0–34.0)
MCHC: 33 g/dL (ref 30.0–36.0)
MCV: 91.3 fL (ref 80.0–100.0)
Platelets: 155 10*3/uL (ref 150–400)
RBC: 4.35 MIL/uL (ref 4.22–5.81)
RDW: 13.2 % (ref 11.5–15.5)
WBC: 9.6 10*3/uL (ref 4.0–10.5)
nRBC: 0 % (ref 0.0–0.2)

## 2021-07-26 LAB — BASIC METABOLIC PANEL
Anion gap: 12 (ref 5–15)
BUN: 10 mg/dL (ref 8–23)
CO2: 28 mmol/L (ref 22–32)
Calcium: 9.8 mg/dL (ref 8.9–10.3)
Chloride: 100 mmol/L (ref 98–111)
Creatinine, Ser: 0.91 mg/dL (ref 0.61–1.24)
GFR, Estimated: >60 mL/min (ref >60)
Glucose, Bld: 108 mg/dL — ABNORMAL HIGH (ref 70–99)
Potassium: 4.1 mmol/L (ref 3.5–5.1)
Sodium: 140 mmol/L (ref 135–145)

## 2021-07-26 MED ORDER — HEPARIN (PORCINE) 25000 UT/250ML-% IV SOLN
1650.0000 [IU]/h | INTRAVENOUS | Status: DC
  Administered 2021-07-26 – 2021-07-28 (×3): 1650 [IU]/h via INTRAVENOUS
  Filled 2021-07-26 (×3): qty 250

## 2021-07-26 MED ORDER — APIXABAN 2.5 MG PO TABS
10.0000 mg | ORAL_TABLET | Freq: Once | ORAL | Status: AC
  Administered 2021-07-26: 10 mg via ORAL
  Filled 2021-07-26: qty 4

## 2021-07-26 MED ORDER — HEPARIN BOLUS VIA INFUSION
2000.0000 [IU] | Freq: Once | INTRAVENOUS | Status: AC
  Administered 2021-07-26: 2000 [IU] via INTRAVENOUS

## 2021-07-26 MED ORDER — HYDROCORTISONE SOD SUC (PF) 100 MG IJ SOLR
200.0000 mg | Freq: Once | INTRAMUSCULAR | Status: AC
  Administered 2021-07-26: 200 mg via INTRAVENOUS
  Filled 2021-07-26: qty 4

## 2021-07-26 MED ORDER — CEPHALEXIN 250 MG PO CAPS
1000.0000 mg | ORAL_CAPSULE | Freq: Once | ORAL | Status: AC
  Administered 2021-07-26: 1000 mg via ORAL
  Filled 2021-07-26: qty 4

## 2021-07-26 MED ORDER — DIPHENHYDRAMINE HCL 50 MG/ML IJ SOLN
50.0000 mg | Freq: Once | INTRAMUSCULAR | Status: AC
  Administered 2021-07-26: 50 mg via INTRAVENOUS
  Filled 2021-07-26 (×2): qty 1

## 2021-07-26 MED ORDER — IOHEXOL 350 MG/ML SOLN
100.0000 mL | Freq: Once | INTRAVENOUS | Status: AC | PRN
  Administered 2021-07-26: 76 mL via INTRAVENOUS

## 2021-07-26 MED ORDER — DIPHENHYDRAMINE HCL 25 MG PO CAPS
50.0000 mg | ORAL_CAPSULE | Freq: Once | ORAL | Status: AC
  Filled 2021-07-26: qty 2

## 2021-07-26 NOTE — ED Notes (Addendum)
O2 drops to 88-89% on RA // pt placed on 2L [place] (STAT 95%) ? ?MD made aware ?

## 2021-07-26 NOTE — Progress Notes (Addendum)
ANTICOAGULATION CONSULT NOTE - Initial Consult ? ?Pharmacy Consult for heparin ?Indication: Pulmonary embolism and DVT ? ?Allergies  ?Allergen Reactions  ? Iohexol   ?   Code: RASH, Desc: rash 2 years ago, needs 13 hr prep for future exams ?  ? Penicillins Hives  ? Shellfish Allergy Swelling  ? ? ?Patient Measurements: ?Height: '5\' 10"'$  (177.8 cm) ?Weight: 106.6 kg (235 lb) ?IBW/kg (Calculated) : 73 ?Heparin Dosing Weight: 95.9 kg ? ?Vital Signs: ?Temp: 98.3 ?F (36.8 ?C) (03/15 1605) ?Temp Source: Oral (03/15 1605) ?BP: 115/56 (03/15 2230) ?Pulse Rate: 91 (03/15 2230) ? ?Labs: ?Recent Labs  ?  07/26/21 ?1830  ?HGB 13.1  ?HCT 39.7  ?PLT 155  ?CREATININE 0.91  ? ? ?Estimated Creatinine Clearance: 100.2 mL/min (by C-G formula based on SCr of 0.91 mg/dL). ? ? ?Medical History: ?Past Medical History:  ?Diagnosis Date  ? Allergy   ? Arthritis   ? osteo  ? CTS (carpal tunnel syndrome)   ? DJD (degenerative joint disease)   ? DM2 (diabetes mellitus, type 2) ([place])   ? GERD (gastroesophageal reflux disease)   ? Gout   ? History of adenomatous polyps of colon 12/29/2019  ? History of nuclear stress test   ? Myoview 11/17: EF 54, no ST changes, hypertensive blood pressure response, no ischemia, low risk  ? Hyperlipidemia   ? Hypothyroidism   ? Nicotine addiction   ? OSA (obstructive sleep apnea)   ? Prostatitis   ? Reactive airway disease   ? Sarcocystosis   ? Sinus arrhythmia   ? Sleep apnea   ? cpap- not wearing  ? Tinea corporis   ? ? ?Medications:  ?(Not in a hospital admission)  ?Scheduled:  ?Infusions:  ? ?Assessment: ?Patient presents with leg swelling, redness, and warmth. CTA  07/26/21 revealed submassive PE with right heart strain and Doppler 07/26/21 revealed DVT in right femoral, popliteal, and calf. Patient not on anticoagulation PTA. Pharmacy consulted to dose heparin. A one time dose of Eliquis was given at 1800 on 07/26/21. The patient decompensated with soft pressures and 2L oxygen requirement. Given recent DOAC  administration and severity of PE, will still give bolus at a reduced dose. ? ?CrCl 100.2 ml/min, BP 113/70, HR 93, 2L O2, RR 20, H/H stable ?  ?Goal of Therapy:  ?Heparin level 0.3-0.7 units/ml or aPTT 66-102 ?Monitor platelets by anticoagulation protocol: Yes ?  ?Plan:  ?Bolus 2000 units of heparin ?Start heparin infusion at 1650 units/hour ?6 hour heparin level and aPTT ?Daily heparin level and CBC ? ?Thank you for allowing pharmacy to participate in this patient's care. ? ?Reatha Harps, PharmD ?PGY1 Pharmacy Resident ?07/26/2021 11:23 PM ?Check [email or web address] for unit specific pharmacy number ? ? ? ?

## 2021-07-26 NOTE — ED Provider Notes (Addendum)
?[facility] ?Provider Note ? ? ?CSN: [id number] ?Arrival date & time: 07/26/21  1557 ? ?  ? ?History ? ?Chief Complaint  ?Patient presents with  ? Post-op Problem  ? ? ?Kyle Delacruz is a 66 y.o. male. ? ?Patient c/o right leg swelling for the past few days. Symptoms acute onset, moderate, persistent. Area w mild erythema and increased warmth. No hx dvt. Had cervical disc surgery 3/6 at [facility] - denies complication. Pain improved at site. No chest pain or discomfort. No sob. No cough or uri symptoms. No fever or chills.  ? ?The history is provided by the patient, the spouse and medical records.  ? ?  ? ?Home Medications ?Prior to Admission medications   ?Medication Sig Start Date End Date Taking? Authorizing Provider  ?albuterol (VENTOLIN HFA) 108 (90 Base) MCG/ACT inhaler Inhale 2 puffs into the lungs every 4 (four) hours. 03/07/21   [provider]  ?allopurinol (ZYLOPRIM) 300 MG tablet Take 300 mg by mouth daily.  06/30/11   [provider]  ?azithromycin (ZITHROMAX) 250 MG tablet Take 250-500 mg by mouth as directed. ?Patient not taking: Reported on 05/17/2021 03/07/21   [provider]  ?chlorpheniramine-HYDROcodone (TUSSIONEX) 10-8 MG/5ML SUER Take 5 mLs by mouth every 12 (twelve) hours as needed for cough. 03/07/21   [provider]  ?CRESTOR 20 MG tablet Take 20 mg by mouth daily.  06/30/11   [provider]  ?diazepam (VALIUM) 5 MG tablet Take 5 mg by mouth as needed. 04/24/21   [provider]  ?ergocalciferol (VITAMIN D2) 1.25 MG (50000 UT) capsule Take 50,000 Units by mouth once a week. Monday ?Patient not taking: Reported on 05/17/2021 08/15/20   [provider]  ?fluticasone (FLONASE) 50 MCG/ACT nasal spray Place 1 spray into the nose 2 (two) times daily as needed for allergies. 06/30/11   [provider]  ?folic acid (FOLVITE) 1 MG tablet Take 1 tablet (1 mg total) by mouth daily. ?Patient not taking: Reported on  05/17/2021 03/12/21   Florencia Reasons, MD  ?levothyroxine (SYNTHROID, LEVOTHROID) 50 MCG tablet Take 50 mcg by mouth daily. 02/18/15   [provider]  ?methylPREDNISolone (MEDROL DOSEPAK) 4 MG TBPK tablet Take 4-20 mg by mouth See admin instructions. 03/07/21   [provider]  ?naproxen sodium (ALEVE) 220 MG tablet Take 440 mg by mouth 2 (two) times daily as needed (pain).    [provider]  ?omeprazole (PRILOSEC) 40 MG capsule Take 40 mg by mouth 2 (two) times daily. 01/31/21   [provider]  ?oxyCODONE-acetaminophen (PERCOCET/ROXICET) 5-325 MG tablet Take 1 tablet by mouth every 8 (eight) hours as needed for severe pain. ?Patient not taking: Reported on 05/17/2021 03/11/21   Florencia Reasons, MD  ?[facility] HANDIHALER 18 MCG inhalation capsule Place 1 capsule into inhaler and inhale daily. 01/30/21   [provider]  ?thiamine 100 MG tablet Take 1 tablet (100 mg total) by mouth daily. ?Patient not taking: Reported on 05/17/2021 03/12/21   Florencia Reasons, MD  ?[facility] INHUB 250-50 MCG/ACT AEPB Inhale 1 puff into the lungs 2 (two) times daily. 01/30/21   [provider]  ?   ? ?Allergies    ?Iohexol, Penicillins, and Shellfish allergy   ? ?Review of Systems   ?Review of Systems  ?Constitutional:  Negative for fever.  ?HENT:  Negative for trouble swallowing.   ?Eyes:  Negative for redness.  ?Respiratory:  Negative for shortness of breath.   ?Cardiovascular:  Positive for  leg swelling. Negative for chest pain.  ?Gastrointestinal:  Negative for abdominal pain.  ?Genitourinary:  Negative for flank pain.  ?Musculoskeletal:  Negative for back pain.  ?Skin:  Negative for rash.  ?Neurological:  Negative for headaches.  ?Hematological:  Does not bruise/bleed easily.  ?Psychiatric/Behavioral:  Negative for confusion.   ? ?Physical Exam ?Updated Vital Signs ?BP 119/81 (BP Location: Left Arm)   Pulse (!) 110   Temp 98.3 ?F (36.8 ?C) (Oral)   Resp 18   Ht 1.778 m ('5\' 10"'$ )   Wt 106.6 kg   SpO2 95%    BMI 33.72 kg/m?  ?Physical Exam ?Vitals and nursing note reviewed.  ?Constitutional:   ?   Appearance: Normal appearance. He is well-developed.  ?HENT:  ?   Head: Atraumatic.  ?   Nose: Nose normal.  ?   Mouth/Throat:  ?   Mouth: Mucous membranes are moist.  ?Eyes:  ?   General: No scleral icterus. ?   Conjunctiva/sclera: Conjunctivae normal.  ?Neck:  ?   Trachea: No tracheal deviation.  ?   Comments: Soft cervical collar.  ?Cardiovascular:  ?   Rate and Rhythm: Normal rate and regular rhythm.  ?   Pulses: Normal pulses.  ?   Heart sounds: Normal heart sounds. No murmur heard. ?  No friction rub. No gallop.  ?Pulmonary:  ?   Effort: Pulmonary effort is normal. No accessory muscle usage or respiratory distress.  ?   Breath sounds: Normal breath sounds.  ?Abdominal:  ?   General: There is no distension.  ?   Tenderness: There is no abdominal tenderness.  ?Genitourinary: ?   Comments: No cva tenderness. ?Musculoskeletal:  ?   Comments: Mild RLE swelling, and skin of RLE anteriorly with mild erythema, and increased warmth. Distal pulses palp. Compartments of leg soft, not tense. Dry, cracked skin to heel, ankle and foot area ?source of mild cellulitis.   ?Skin: ?   General: Skin is warm and dry.  ?   Findings: No rash.  ?Neurological:  ?   Mental Status: He is alert.  ?   Comments: Alert, speech clear.   ?Psychiatric:     ?   Mood and Affect: Mood normal.  ? ? ?ED Results / Procedures / Treatments   ?Labs ?(all labs ordered are listed, but only abnormal results are displayed) ?Results for orders placed or performed during the hospital encounter of 07/26/21  ?Basic metabolic panel  ?Result Value Ref Range  ? Sodium 140 135 - 145 mmol/L  ? Potassium 4.1 3.5 - 5.1 mmol/L  ? Chloride 100 98 - 111 mmol/L  ? CO2 28 22 - 32 mmol/L  ? Glucose, Bld 108 (H) 70 - 99 mg/dL  ? BUN 10 8 - 23 mg/dL  ? Creatinine, Ser 0.91 0.61 - 1.24 mg/dL  ? Calcium 9.8 8.9 - 10.3 mg/dL  ? GFR, Estimated >60 >60 mL/min  ? Anion gap 12 5 - 15  ?CBC   ?Result Value Ref Range  ? WBC 9.6 4.0 - 10.5 K/uL  ? RBC 4.35 4.22 - 5.81 MIL/uL  ? Hemoglobin 13.1 13.0 - 17.0 g/dL  ? HCT 39.7 39.0 - 52.0 %  ? MCV 91.3 80.0 - 100.0 fL  ? MCH 30.1 26.0 - 34.0 pg  ? MCHC 33.0 30.0 - 36.0 g/dL  ? RDW 13.2 11.5 - 15.5 %  ? Platelets 155 150 - 400 K/uL  ? nRBC 0.0 0.0 - 0.2 %  ? ?CT Angio Chest  PE W/Cm &/Or Wo Cm ? ?Result Date: 07/26/2021 ?CLINICAL DATA:  Pulmonary embolus suspected with high probability. Recent surgery with shortness of breath since discharge. EXAM: CT ANGIOGRAPHY CHEST WITH CONTRAST TECHNIQUE: Multidetector CT imaging of the chest was performed using the standard protocol during bolus administration of intravenous contrast. Multiplanar CT image reconstructions and MIPs were obtained to evaluate the vascular anatomy. RADIATION DOSE REDUCTION: This exam was performed according to the departmental dose-optimization program which includes automated exposure control, adjustment of the mA and/or kV according to patient size and/or use of iterative reconstruction technique. CONTRAST:  77m OMNIPAQUE IOHEXOL 350 MG/ML SOLN COMPARISON:  None. FINDINGS: Cardiovascular: Good opacification of the central and segmental pulmonary arteries. Multiple filling defects are demonstrated in the distal main pulmonary arteries bilaterally and extending into multiple bilateral upper and lower lobe segmental branches. This indicates acute pulmonary embolus. Measurement evidence of right heart strain with RV to LV ratio measuring 1.5. Dilatation of the right atrium. No pericardial effusions. Normal caliber thoracic aorta without dissection. Great vessel origins are patent. Calcification of the aorta and coronary arteries. Mediastinum/Nodes: No enlarged mediastinal, hilar, or axillary lymph nodes. Thyroid gland, trachea, and esophagus demonstrate no significant findings. Lungs/Pleura: Focal consolidation or atelectasis in the right lower lung. No pleural effusions. Upper Abdomen: Layering  density in the gallbladder likely represents sludge. Small stones are demonstrated in both kidneys. No hydronephrosis. Musculoskeletal: Degenerative changes.  No destructive bone lesions. Review of the MIP

## 2021-07-26 NOTE — ED Notes (Signed)
Patient transported to CT 

## 2021-07-26 NOTE — ED Triage Notes (Signed)
Patient arrives ambulatory. Reports neck surgery 3/6. C/o shortness of breath since he was discharged from hospital. 2-3 days ago right leg began swelling along with redness and warmth.  ?

## 2021-07-26 NOTE — Progress Notes (Signed)
Plan of Care Note for accepted transfer ? ? ?Patient: Kyle Delacruz MRN: [id number]   DOA: 07/26/2021 ? ?Facility requesting transfer: [facility] ?Requesting Provider: Dr. Lajean Saver ?Reason for transfer: Acute large PE ?Facility course:  ?Patient is a 66 year old male with a past medical history of hypothyroidism, hyperlipidemia, alcohol use, OSA, type 2 diabetes, severe C4-C5 cervical canal stenosis status post recent surgery on 3/6 presented to the ED complaining of right leg swelling and dyspnea.  Right lower extremity Doppler positive for DVT.  CT angiogram revealed acute large PE with evidence of right heart strain (RV/LV ratio = 1.5).  Patient sats dropped to 88-89% on room air, improved with 2 L supplemental oxygen.  Blood pressure stable and not tachycardic.  Started on IV heparin.  CT also showing focal consolidation or atelectasis in the right lower lung.  No fever or leukocytosis.  ED physician discussed the case with critical care, recommending admission to progressive care unit under hospitalist service, can be consulted once patient arrives to the hospital.  I have requested troponin and BNP to be added onto labs. ? ?Plan of care: ?The patient is accepted for admission to Progressive unit, at [facility]..  ? ?Author: ?Shela Leff, MD ?07/26/2021 ? ?Check [email or web address] for on-call coverage. ? ?Nursing staff, Please call [place] number on Amion as soon as patient's arrival, so appropriate admitting provider can evaluate the pt. ?

## 2021-07-27 ENCOUNTER — Inpatient Hospital Stay (HOSPITAL_COMMUNITY): Payer: Medicare Other

## 2021-07-27 ENCOUNTER — Other Ambulatory Visit (HOSPITAL_COMMUNITY): Payer: Self-pay

## 2021-07-27 ENCOUNTER — Encounter (HOSPITAL_COMMUNITY): Payer: Self-pay | Admitting: Internal Medicine

## 2021-07-27 DIAGNOSIS — Z888 Allergy status to other drugs, medicaments and biological substances status: Secondary | ICD-10-CM | POA: Diagnosis not present

## 2021-07-27 DIAGNOSIS — J9811 Atelectasis: Secondary | ICD-10-CM | POA: Diagnosis present

## 2021-07-27 DIAGNOSIS — I82431 Acute embolism and thrombosis of right popliteal vein: Secondary | ICD-10-CM | POA: Diagnosis present

## 2021-07-27 DIAGNOSIS — I10 Essential (primary) hypertension: Secondary | ICD-10-CM | POA: Diagnosis present

## 2021-07-27 DIAGNOSIS — J449 Chronic obstructive pulmonary disease, unspecified: Secondary | ICD-10-CM | POA: Diagnosis present

## 2021-07-27 DIAGNOSIS — I82401 Acute embolism and thrombosis of unspecified deep veins of right lower extremity: Secondary | ICD-10-CM | POA: Diagnosis not present

## 2021-07-27 DIAGNOSIS — E119 Type 2 diabetes mellitus without complications: Secondary | ICD-10-CM | POA: Diagnosis present

## 2021-07-27 DIAGNOSIS — Z8546 Personal history of malignant neoplasm of prostate: Secondary | ICD-10-CM | POA: Diagnosis not present

## 2021-07-27 DIAGNOSIS — E039 Hypothyroidism, unspecified: Secondary | ICD-10-CM | POA: Diagnosis present

## 2021-07-27 DIAGNOSIS — I82411 Acute embolism and thrombosis of right femoral vein: Secondary | ICD-10-CM | POA: Diagnosis present

## 2021-07-27 DIAGNOSIS — Z87891 Personal history of nicotine dependence: Secondary | ICD-10-CM | POA: Diagnosis not present

## 2021-07-27 DIAGNOSIS — Z20822 Contact with and (suspected) exposure to covid-19: Secondary | ICD-10-CM | POA: Diagnosis present

## 2021-07-27 DIAGNOSIS — Z96643 Presence of artificial hip joint, bilateral: Secondary | ICD-10-CM | POA: Diagnosis present

## 2021-07-27 DIAGNOSIS — Z86010 Personal history of colonic polyps: Secondary | ICD-10-CM | POA: Diagnosis not present

## 2021-07-27 DIAGNOSIS — M7989 Other specified soft tissue disorders: Secondary | ICD-10-CM | POA: Diagnosis present

## 2021-07-27 DIAGNOSIS — Z91013 Allergy to seafood: Secondary | ICD-10-CM | POA: Diagnosis not present

## 2021-07-27 DIAGNOSIS — C61 Malignant neoplasm of prostate: Secondary | ICD-10-CM | POA: Diagnosis not present

## 2021-07-27 DIAGNOSIS — Z833 Family history of diabetes mellitus: Secondary | ICD-10-CM | POA: Diagnosis not present

## 2021-07-27 DIAGNOSIS — Z8249 Family history of ischemic heart disease and other diseases of the circulatory system: Secondary | ICD-10-CM | POA: Diagnosis not present

## 2021-07-27 DIAGNOSIS — Z823 Family history of stroke: Secondary | ICD-10-CM | POA: Diagnosis not present

## 2021-07-27 DIAGNOSIS — I2609 Other pulmonary embolism with acute cor pulmonale: Secondary | ICD-10-CM | POA: Diagnosis not present

## 2021-07-27 DIAGNOSIS — Z880 Allergy status to penicillin: Secondary | ICD-10-CM | POA: Diagnosis not present

## 2021-07-27 DIAGNOSIS — E785 Hyperlipidemia, unspecified: Secondary | ICD-10-CM | POA: Diagnosis present

## 2021-07-27 DIAGNOSIS — I2699 Other pulmonary embolism without acute cor pulmonale: Secondary | ICD-10-CM | POA: Diagnosis present

## 2021-07-27 DIAGNOSIS — M109 Gout, unspecified: Secondary | ICD-10-CM | POA: Diagnosis present

## 2021-07-27 DIAGNOSIS — Z91030 Bee allergy status: Secondary | ICD-10-CM | POA: Diagnosis not present

## 2021-07-27 DIAGNOSIS — J9601 Acute respiratory failure with hypoxia: Secondary | ICD-10-CM | POA: Diagnosis present

## 2021-07-27 LAB — CBC WITH DIFFERENTIAL/PLATELET
Abs Immature Granulocytes: 0.03 10*3/uL (ref 0.00–0.07)
Basophils Absolute: 0 10*3/uL (ref 0.0–0.1)
Basophils Relative: 0 %
Eosinophils Absolute: 0 10*3/uL (ref 0.0–0.5)
Eosinophils Relative: 0 %
HCT: 36.7 % — ABNORMAL LOW (ref 39.0–52.0)
Hemoglobin: 12.1 g/dL — ABNORMAL LOW (ref 13.0–17.0)
Immature Granulocytes: 0 %
Lymphocytes Relative: 15 %
Lymphs Abs: 1.4 10*3/uL (ref 0.7–4.0)
MCH: 29.8 pg (ref 26.0–34.0)
MCHC: 33 g/dL (ref 30.0–36.0)
MCV: 90.4 fL (ref 80.0–100.0)
Monocytes Absolute: 0.7 10*3/uL (ref 0.1–1.0)
Monocytes Relative: 7 %
Neutro Abs: 7.3 10*3/uL (ref 1.7–7.7)
Neutrophils Relative %: 78 %
Platelets: 162 10*3/uL (ref 150–400)
RBC: 4.06 MIL/uL — ABNORMAL LOW (ref 4.22–5.81)
RDW: 13.1 % (ref 11.5–15.5)
WBC: 9.4 10*3/uL (ref 4.0–10.5)
nRBC: 0 % (ref 0.0–0.2)

## 2021-07-27 LAB — COMPREHENSIVE METABOLIC PANEL
ALT: 13 U/L (ref 0–44)
AST: 14 U/L — ABNORMAL LOW (ref 15–41)
Albumin: 2.9 g/dL — ABNORMAL LOW (ref 3.5–5.0)
Alkaline Phosphatase: 50 U/L (ref 38–126)
Anion gap: 14 (ref 5–15)
BUN: 11 mg/dL (ref 8–23)
CO2: 25 mmol/L (ref 22–32)
Calcium: 9.1 mg/dL (ref 8.9–10.3)
Chloride: 99 mmol/L (ref 98–111)
Creatinine, Ser: 1.06 mg/dL (ref 0.61–1.24)
GFR, Estimated: >60 mL/min (ref >60)
Glucose, Bld: 147 mg/dL — ABNORMAL HIGH (ref 70–99)
Potassium: 4.3 mmol/L (ref 3.5–5.1)
Sodium: 138 mmol/L (ref 135–145)
Total Bilirubin: 1.3 mg/dL — ABNORMAL HIGH (ref 0.3–1.2)
Total Protein: 6.3 g/dL — ABNORMAL LOW (ref 6.5–8.1)

## 2021-07-27 LAB — GLUCOSE, CAPILLARY
Glucose-Capillary: 109 mg/dL — ABNORMAL HIGH (ref 70–99)
Glucose-Capillary: 103 mg/dL — ABNORMAL HIGH (ref 70–99)
Glucose-Capillary: 112 mg/dL — ABNORMAL HIGH (ref 70–99)

## 2021-07-27 LAB — TSH: TSH: 0.936 u[IU]/mL (ref 0.350–4.500)

## 2021-07-27 LAB — ECHOCARDIOGRAM COMPLETE
AR max vel: 3.52 cm2
AV Area VTI: 3.4 cm2
AV Area mean vel: 3.37 cm2
AV Mean grad: 2 mmHg
AV Peak grad: 4.1 mmHg
Ao pk vel: 1.01 m/s
Area-P 1/2: 4.8 cm2
Height: 70 in
MV VTI: 4.06 cm2
S' Lateral: 3.2 cm
Weight: 3798.97 oz

## 2021-07-27 LAB — HEPARIN LEVEL (UNFRACTIONATED): Heparin Unfractionated: >1.1 IU/mL — ABNORMAL HIGH (ref 0.30–0.70)

## 2021-07-27 LAB — TROPONIN I (HIGH SENSITIVITY)
Troponin I (High Sensitivity): 10 ng/L (ref <18)
Troponin I (High Sensitivity): 6 ng/L (ref <18)

## 2021-07-27 LAB — APTT
aPTT: 84 seconds — ABNORMAL HIGH (ref 24–36)
aPTT: 86 seconds — ABNORMAL HIGH (ref 24–36)

## 2021-07-27 LAB — LACTIC ACID, PLASMA: Lactic Acid, Venous: 1.3 mmol/L (ref 0.5–1.9)

## 2021-07-27 LAB — BRAIN NATRIURETIC PEPTIDE: B Natriuretic Peptide: 46.5 pg/mL (ref 0.0–100.0)

## 2021-07-27 LAB — MAGNESIUM: Magnesium: 1.9 mg/dL (ref 1.7–2.4)

## 2021-07-27 MED ORDER — HYDROCODONE-ACETAMINOPHEN 5-325 MG PO TABS
1.0000 | ORAL_TABLET | ORAL | Status: DC | PRN
Start: 2021-07-27 — End: 2021-07-28
  Administered 2021-07-27: 1 via ORAL
  Filled 2021-07-27: qty 1

## 2021-07-27 MED ORDER — LEVOTHYROXINE SODIUM 50 MCG PO TABS
50.0000 ug | ORAL_TABLET | Freq: Every day | ORAL | Status: DC
  Administered 2021-07-27 – 2021-07-28 (×2): 50 ug via ORAL
  Filled 2021-07-27 (×2): qty 1

## 2021-07-27 MED ORDER — PERFLUTREN LIPID MICROSPHERE
1.0000 mL | INTRAVENOUS | Status: AC | PRN
  Administered 2021-07-27: 2 mL via INTRAVENOUS

## 2021-07-27 MED ORDER — ACETAMINOPHEN 325 MG PO TABS
650.0000 mg | ORAL_TABLET | Freq: Four times a day (QID) | ORAL | Status: DC | PRN
Start: 2021-07-27 — End: 2021-07-28
  Administered 2021-07-27: 650 mg via ORAL
  Filled 2021-07-27: qty 2

## 2021-07-27 MED ORDER — PANTOPRAZOLE SODIUM 40 MG PO TBEC
40.0000 mg | DELAYED_RELEASE_TABLET | Freq: Every day | ORAL | Status: DC
  Administered 2021-07-27 – 2021-07-28 (×2): 40 mg via ORAL
  Filled 2021-07-27 (×2): qty 1

## 2021-07-27 MED ORDER — ONDANSETRON HCL 4 MG/2ML IJ SOLN
4.0000 mg | Freq: Four times a day (QID) | INTRAMUSCULAR | Status: DC | PRN
Start: 2021-07-27 — End: 2021-07-28

## 2021-07-27 MED ORDER — ACETAMINOPHEN 650 MG RE SUPP: 650.0000 mg | Freq: Four times a day (QID) | RECTAL | Status: DC | PRN

## 2021-07-27 MED ORDER — ONDANSETRON HCL 4 MG PO TABS: 4.0000 mg | ORAL_TABLET | Freq: Four times a day (QID) | ORAL | Status: DC | PRN

## 2021-07-27 NOTE — Progress Notes (Signed)
ANTICOAGULATION CONSULT NOTE - Follow Up Consult ? ?Pharmacy Consult for heparin ?Indication: pulmonary embolus ? ?Labs: ?Recent Labs  ?  07/26/21 ?1830 07/26/21 ?2351 07/27/21 ?1950  ?HGB 13.1  --  12.1*  ?HCT 39.7  --  36.7*  ?PLT 155  --  162  ?APTT  --   --  84*  ?HEPARINUNFRC  --   --  >1.10*  ?CREATININE 0.91  --  1.06  ?TROPONINIHS  --  6 10  ? ? ?Assessment/Plan:  ?66yo male therapeutic on heparin with initial dosing for PE. Will continue infusion at current rate of 1650 units/hr and confirm stable with additional PTT.  ? ?Wynona Neat, PharmD, BCPS  ?07/27/2021,7:11 AM ? ? ?

## 2021-07-27 NOTE — Assessment & Plan Note (Signed)
Admit to progressive care bed. Continue with IV heparin. PCCM consult. Doubt he will need TPA. Check echo. Pt had c-spine surgery on 07-17-2021. ?

## 2021-07-27 NOTE — Assessment & Plan Note (Signed)
stable °

## 2021-07-27 NOTE — ED Notes (Signed)
Report given to [facility] 

## 2021-07-27 NOTE — TOC Progression Note (Signed)
Transition of Care (TOC) - Progression Note  ? ? ?Patient Details  ?Name: Cordarryl Monrreal [facility] ?MRN: [id number] ?Date of Birth: 12-Mar-1956 ? ?Transition of Care (TOC) CM/SW Contact  ?Ulanda Tackett Renold Don, LCSWA ?Phone Number: ?07/27/2021, 9:50 AM ? ?Clinical Narrative:    ? ?Transition of Care (TOC) Screening Note ? ? ?Patient Details  ?Name: Olney Monier [facility] ?Date of Birth: 1956-03-13 ? ? ?Transition of Care (TOC) CM/SW Contact:    ?Reece Agar, LCSWA ?Phone Number: ?07/27/2021, 9:51 AM ? ? ? ?Transition of Care Department [facility]) has reviewed patient and no TOC needs have been identified at this time. We will continue to monitor patient advancement through interdisciplinary progression rounds. If new patient transition needs arise, please place a TOC consult. ? ? ? ? ?  ?  ? ?Expected Discharge Plan and Services ?  ?  ?  ?  ?  ?                ?  ?  ?  ?  ?  ?  ?  ?  ?  ?  ? ? ?Social Determinants of Health (SDOH) Interventions ?  ? ?Readmission Risk Interventions ?No flowsheet data found. ? ?

## 2021-07-27 NOTE — Progress Notes (Signed)
ANTICOAGULATION CONSULT NOTE ? ?Pharmacy Consult for Heparin ?Indication: pulmonary embolus & DVT ? ?Allergies  ?Allergen Reactions  ? Iohexol   ?   Code: RASH, Desc: rash 2 years ago, needs 13 hr prep for future exams ?  ? Penicillins Hives  ? Shellfish Allergy Swelling  ? ? ?Patient Measurements: ?Height: '5\' 10"'$  (177.8 cm) ?Weight: 107.7 kg (237 lb 7 oz) ?IBW/kg (Calculated) : 73 ? ?Heparin Dosing Weight: 96 kg ? ?Vital Signs: ?Temp: 97.7 ?F (36.5 ?C) (03/16 1125) ?Temp Source: Oral (03/16 1125) ?BP: 106/67 (03/16 1125) ?Pulse Rate: 93 (03/16 1125) ? ?Labs: ?Recent Labs  ?  07/26/21 ?1830 07/26/21 ?2351 07/27/21 ?3149 07/27/21 ?1328  ?HGB 13.1  --  12.1*  --   ?HCT 39.7  --  36.7*  --   ?PLT 155  --  162  --   ?APTT  --   --  84* 86*  ?HEPARINUNFRC  --   --  >1.10*  --   ?CREATININE 0.91  --  1.06  --   ?TROPONINIHS  --  6 10  --   ? ? ?Estimated Creatinine Clearance: 86.5 mL/min (by C-G formula based on SCr of 1.06 mg/dL). ? ? ?Assessment: ?66 yo male who presented with leg swelling and SOB found to have a submassive PE w/ RHS and DVTs in right femoral, popliteal, and calf. Patient not on anticoagulation PTA. Pharmacy consulted to dose heparin.  ? ?While in [facility] ED, x1 dose of apixaban '10mg'$  PO was given at 1800 on 03/15. The patient decompensated with soft pressures and worsening oxygen requirements. Decision was then to transition patient to IV heparin for PE management.   ? ?Heparin level remains artificially elevated. aPTT within goal range at 84 seconds. Repeat aPTT remains therapeutic at 86 seconds. CBC stable, no s/sx bleeding reported.  ? ?Goal of Therapy:  ?Heparin level 0.3-0.7 units/ml ?aPTT 66-102 seconds ?Monitor platelets by anticoagulation protocol: Yes ?  ?Plan:  ?Continue heparin infusion at 1650 units/hr ?Check heparin level daily while on heparin ?Continue to monitor H&H and platelets ? ? ? ?Thank you for allowing pharmacy to be a part of this patient?s care. ? ?Ardyth Harps, PharmD ?Clinical  Pharmacist ? ?

## 2021-07-27 NOTE — Consult Note (Addendum)
? ?NAME:  Kyle Delacruz, MRN:  [id number], DOB:  1956-03-07, LOS: 0 ?ADMISSION DATE:  07/26/2021, CONSULTATION DATE: 07/27/21 ?REFERRING MD:  Bridgett Larsson, CHIEF COMPLAINT:  Acute PE  ? ?History of Present Illness:  ?Kyle Delacruz is a 66 y.o. M with PMH significant for Type 2 DM, GERD, HL, Hypothyroidism, OSA, prostate Ca who recently underwent C 4-7 laminoplasty and hemilaminoplasty on 3/6 and had been mostly immobile post-surgery.  He developed shortness of breath, dyspnea and chest pain approximately 4 days before admission.  ? ?On presentation to the ED at [facility] he was tachycardic in the 110's, normotensive and hypoxic to 89%.  CTA chest revealed multiple filling defects in the distal main pulmonary arteries with RV to LV ratio of 1.5.  He was started on heparin and transferred to [name] and PCCM consulted.  At the time of evaluation, pt was resting without dyspnea or chest pain.  Stable on 3L [place] with HR 90-120 and normal blood pressure.  He denies any prior of family history of blood clots.  ? ?Pertinent  Medical History  ? has a past medical history of Allergy, Arthritis, CTS (carpal tunnel syndrome), DJD (degenerative joint disease), DM2 (diabetes mellitus, type 2) ([place]), GERD (gastroesophageal reflux disease), Gout, History of adenomatous polyps of colon (12/29/2019), History of nuclear stress test, Hyperlipidemia, Hypothyroidism, Nicotine addiction, OSA (obstructive sleep apnea), Prostatitis, Reactive airway disease, Sarcocystosis, Sinus arrhythmia, Sleep apnea, and Tinea corporis. ? ? ?Significant Hospital Events: ?Including procedures, antibiotic start and stop dates in addition to other pertinent events   ?3/16 arrived to [facility] on nasal cannula, on heparin gtt ? ?Interim History / Subjective:  ?Pt denies further chest pain or dyspnea, on nasal cannula ? ?Objective   ?Blood pressure 120/76, pulse 96, temperature 97.6 ?F (36.4 ?C), temperature source Oral, resp. rate 20, height '5\' 10"'$   (1.778 m), weight 107.7 kg, SpO2 95 %. ?   ?   ?No intake or output data in the 24 hours ending 07/27/21 0556 ?Filed Weights  ? 07/26/21 1605 07/27/21 0300  ?Weight: 106.6 kg 107.7 kg  ? ? ?General:  well-nourished M, resting in bed in no acute distress ?HEENT: MM pink/moist, sclera anciteric  ?Neuro: awake and alert, oriented x3 and following commands ?CV: s1s2 tachycardic, regular, no m/r/g ?PULM:  clear bilaterally without rhonchi or wheezing on 3L  ?GI: soft, bsx4 active  ?Extremities: warm/dry, 2+ RLE edema  ?Skin: no rashes or lesions ? ? ?Resolved Hospital Problem list   ? ? ?Assessment & Plan:  ? ? ?Acute Hypoxic Respiratory Failure secondary to Pulmonary Embolism ?RV:LV ratio 1.5, PESI class V, high risk ?RLE with Deep venous thrombosis of the right femoral, popliteal, and calf ?veins. ?-continue heparin and check echo, pt is high risk but is currently comfortable on 3L, normotensive and with intermittent tachycardia.  trop 6 and BNP 46.5 ?-do not think patient currently requires lytics or IR intervention, though if worsens would consider TPA ?-check repeat trop and obtain lactic acid ?-Echo pending ?-continue nasal cannula O2 to maintain sats >92% ?-PCCM will continue to follow with you ? ? ? ? ?Best Practice (right click and "Reselect all SmartList Selections" daily)  ? ?Per primary ? ?Labs   ?CBC: ?Recent Labs  ?Lab 07/26/21 ?1830  ?WBC 9.6  ?HGB 13.1  ?HCT 39.7  ?MCV 91.3  ?PLT 155  ? ? ?Basic Metabolic Panel: ?Recent Labs  ?Lab 07/26/21 ?1830  ?NA 140  ?K 4.1  ?CL 100  ?CO2  28  ?GLUCOSE 108*  ?BUN 10  ?CREATININE 0.91  ?CALCIUM 9.8  ? ?GFR: ?Estimated Creatinine Clearance: 100.8 mL/min (by C-G formula based on SCr of 0.91 mg/dL). ?Recent Labs  ?Lab 07/26/21 ?1830  ?WBC 9.6  ? ? ?Liver Function Tests: ?No results for input(s): AST, ALT, ALKPHOS, BILITOT, PROT, ALBUMIN in the last 168 hours. ?No results for input(s): LIPASE, AMYLASE in the last 168 hours. ?No results for input(s): AMMONIA in the last  168 hours. ? ?ABG ?No results found for: PHART, PCO2ART, PO2ART, HCO3, TCO2, ACIDBASEDEF, O2SAT  ? ?Coagulation Profile: ?No results for input(s): INR, PROTIME in the last 168 hours. ? ?Cardiac Enzymes: ?No results for input(s): CKTOTAL, CKMB, CKMBINDEX, TROPONINI in the last 168 hours. ? ?HbA1C: ?No results found for: HGBA1C ? ?CBG: ?No results for input(s): GLUCAP in the last 168 hours. ? ?Review of Systems:   ?Please see the history of present illness. All other systems reviewed and are negative  ? ? ?Past Medical History:  ?He,  has a past medical history of Allergy, Arthritis, CTS (carpal tunnel syndrome), DJD (degenerative joint disease), DM2 (diabetes mellitus, type 2) ([place]), GERD (gastroesophageal reflux disease), Gout, History of adenomatous polyps of colon (12/29/2019), History of nuclear stress test, Hyperlipidemia, Hypothyroidism, Nicotine addiction, OSA (obstructive sleep apnea), Prostatitis, Reactive airway disease, Sarcocystosis, Sinus arrhythmia, Sleep apnea, and Tinea corporis.  ? ?Surgical History:  ? ?Past Surgical History:  ?Procedure Laterality Date  ? BILATERAL KNEE ARTHROSCOPY    ? right- 1997; left 2001  ? COLONOSCOPY  multiple last 2013  ? KNEE SURGERY Right 2014  ? torn R medial meniscus- arthroscopy done  ? PARATHYROIDECTOMY  2005  ? ROTATOR CUFF REPAIR  2004  ? right  ? TONSILLECTOMY    ? TOTAL HIP ARTHROPLASTY Left 2009  ? TOTAL HIP ARTHROPLASTY Right 2006  ? UPPER GASTROINTESTINAL ENDOSCOPY    ?  ? ?Social History:  ? reports that he has quit smoking. His smoking use included cigarettes. He smoked an average of .5 packs per day. He has never used smokeless tobacco. He reports current alcohol use of about 21.0 standard drinks per week. He reports that he does not use drugs.  ? ?Family History:  ?His family history includes Diabetes in his father and paternal grandmother; Heart disease in his father; Stroke in his mother. There is no history of Colon cancer, Stomach cancer, Esophageal  cancer, Rectal cancer, or Prostate cancer.  ? ?Allergies ?Allergies  ?Allergen Reactions  ? Iohexol   ?   Code: RASH, Desc: rash 2 years ago, needs 13 hr prep for future exams ?  ? Penicillins Hives  ? Shellfish Allergy Swelling  ?  ? ?Home Medications  ?Prior to Admission medications   ?Medication Sig Start Date End Date Taking? Authorizing Provider  ?albuterol (VENTOLIN HFA) 108 (90 Base) MCG/ACT inhaler Inhale 2 puffs into the lungs every 4 (four) hours. 03/07/21   [provider]  ?allopurinol (ZYLOPRIM) 300 MG tablet Take 300 mg by mouth daily.  06/30/11   [provider]  ?azithromycin (ZITHROMAX) 250 MG tablet Take 250-500 mg by mouth as directed. ?Patient not taking: Reported on 05/17/2021 03/07/21   [provider]  ?chlorpheniramine-HYDROcodone (TUSSIONEX) 10-8 MG/5ML SUER Take 5 mLs by mouth every 12 (twelve) hours as needed for cough. 03/07/21   [provider]  ?CRESTOR 20 MG tablet Take 20 mg by mouth daily.  06/30/11   [provider]  ?diazepam (VALIUM) 5 MG tablet Take 5 mg by  mouth as needed. 04/24/21   [provider]  ?ergocalciferol (VITAMIN D2) 1.25 MG (50000 UT) capsule Take 50,000 Units by mouth once a week. Monday ?Patient not taking: Reported on 05/17/2021 08/15/20   [provider]  ?fluticasone (FLONASE) 50 MCG/ACT nasal spray Place 1 spray into the nose 2 (two) times daily as needed for allergies. 06/30/11   [provider]  ?folic acid (FOLVITE) 1 MG tablet Take 1 tablet (1 mg total) by mouth daily. ?Patient not taking: Reported on 05/17/2021 03/12/21   Florencia Reasons, MD  ?levothyroxine (SYNTHROID, LEVOTHROID) 50 MCG tablet Take 50 mcg by mouth daily. 02/18/15   [provider]  ?methylPREDNISolone (MEDROL DOSEPAK) 4 MG TBPK tablet Take 4-20 mg by mouth See admin instructions. 03/07/21   [provider]  ?naproxen sodium (ALEVE) 220 MG tablet Take 440 mg by mouth 2 (two) times daily as needed (pain).    [provider]  ?omeprazole (PRILOSEC) 40 MG capsule Take 40 mg by mouth 2 (two) times daily. 01/31/21   [provider]  ?oxyCODONE-acetaminophen (PERCOCET/ROXICET) 5-325 MG tablet Take 1 tablet b

## 2021-07-27 NOTE — Progress Notes (Signed)
This is a no charge noticed patient was admitted this AM.  Patient seen and examined H&P reviewed. ?66 year old African-American male with a history of hypertension, hypothyroidism, history of prostate cancer presents to the ER with 3 to 4 days of worsening shortness of breath, right leg swelling, chest pain with exertion.  Patient had neurosurgery surgery on 3/6 and has been less mobile.  He was found with pulmonary embolism and right lower extremity DVT.  Started on heparin drip.  Critical care consulted. ? ?Patient denies any chest pain this AM.  Has not walked much to see if he has any shortness of breath.  No recent.  Wife at bedside. ? ?Cta no w/r ?Reg s1/s2 no gallop ?Soft benign +bs ?Positive edema b/l R.L ? ?A/P ?Continue heparin gtt, until able to wean off 02, then transition to Eliquis on dc. ?Ambulatory 02 ? ?

## 2021-07-27 NOTE — Consult Note (Signed)
? ?  NAME:  Kyle Delacruz, MRN:  [id number], DOB:  09/26/1955, LOS: 0 ?ADMISSION DATE:  07/26/2021, CONSULTATION DATE: 07/27/21 ?REFERRING MD:  Bridgett Larsson, CHIEF COMPLAINT:  Acute PE  ? ?History of Present Illness:  ?Kyle Delacruz is a 66 y.o. M with PMH significant for Type 2 DM, GERD, HL, Hypothyroidism, OSA, prostate Ca who recently underwent C 4-7 laminoplasty and hemilaminoplasty on 3/6 and had been mostly immobile post-surgery.  He developed shortness of breath, dyspnea and chest pain approximately 4 days before admission.  ? ?On presentation to the ED at [facility] he was tachycardic in the 110's, normotensive and hypoxic to 89%.  CTA chest revealed multiple filling defects in the distal main pulmonary arteries with RV to LV ratio of 1.5.  He was started on heparin and transferred to [name] and PCCM consulted.  At the time of evaluation, pt was resting without dyspnea or chest pain.  Stable on 3L [place] with HR 90-120 and normal blood pressure.  He denies any prior of family history of blood clots.  ? ?Pertinent  Medical History  ? has a past medical history of Allergy, Arthritis, CTS (carpal tunnel syndrome), DJD (degenerative joint disease), DM2 (diabetes mellitus, type 2) ([place]), GERD (gastroesophageal reflux disease), Gout, History of adenomatous polyps of colon (12/29/2019), History of nuclear stress test, Hyperlipidemia, Hypothyroidism, Nicotine addiction, OSA (obstructive sleep apnea), Prostatitis, Reactive airway disease, Sarcocystosis, Sinus arrhythmia, Sleep apnea, and Tinea corporis. ? ? ?Significant Hospital Events: ?Including procedures, antibiotic start and stop dates in addition to other pertinent events   ?3/16 arrived to [facility] on nasal cannula, on heparin gtt ? ?Interim History / Subjective:  ?Thinks he's feeling a little better since he got here. ? ?Objective   ?Blood pressure 106/67, pulse 93, temperature 97.7 ?F (36.5 ?C), temperature source Oral, resp. rate 16, height '5\' 10"'$   (1.778 m), weight 107.7 kg, SpO2 96 %. ?   ?   ? ?Intake/Output Summary (Last 24 hours) at 07/27/2021 1526 ?Last data filed at 07/27/2021 1100 ?Gross per 24 hour  ?Intake 240 ml  ?Output 400 ml  ?Net -160 ml  ? ?Filed Weights  ? 07/26/21 1605 07/27/21 0300  ?Weight: 106.6 kg 107.7 kg  ? ? ?General appearance: 65 y.o., male, NAD, conversant  ?Eyes: PERRL, tracking appropriately ?HENT: NCAT; MMM ?Neck: Trachea midline; no lymphadenopathy, no JVD ?Lungs: CTAB, no crackles, no wheeze, with normal respiratory effort ?CV: RRR, no murmur  ?Abdomen: Soft, non-tender; non-distended, BS present  ?Extremities: +BLE edema, warm ?Skin: Normal turgor and texture; no rash ?Psych: Appropriate affect ?Neuro: Alert and oriented to person and place, no focal deficit  ? ? ?TTE with normal appearing RV ? ?Resolved Hospital Problem list   ? ? ?Assessment & Plan:  ? ? ?# Provoked pulmonary embolism with radiographic right heart strain ?# RLE DVT ?-heparin gtt ?-agree with primary team plan for transition to [facility] with plan for 3 month course  ?-wean nasal cannula O2 to maintain sats >92% ? ?Will sign off but glad to be reinvolved as condition changes ? ?Best Practice (right click and "Reselect all SmartList Selections" daily)  ? ?Per primary ? ? ?  ? ? ?[place] Shadow  ?[place] ? ? ? ? ?

## 2021-07-27 NOTE — Subjective & Objective (Signed)
CC: SOB, right leg swelling, CP with exertion ?HPI: ?67 year old African-American male with a history of hypertension, hypothyroidism, history of prostate cancer presents to the ER with 3 to 4 days of worsening shortness of breath, right leg swelling, chest pain with exertion.  Patient was recently status post cervical spine surgery on 07/17/2021 at [facility] hospital for C4-7 laminoplasty and a hemilaminectomy.  Patient went to the ER today due to 4 days of shortness of breath, dyspnea exertion, chest pain with exertion, right leg swelling.  Wife states the patient has been basically sitting in a recliner with his feet down for the last week.  He has not moved very much per day.  He is been able to get to the bathroom but that is about all.  Patient was not taking anything for DVT prophylaxis postsurgery. ? ?On arrival to the ER, temp 98.3 heart rate 110 blood pressure 119/81 satting 95% on room air.  O2 saturations dropped to 89% patient started on 2 L of oxygen. ? ?Due to his right leg swelling, right lower extremity ultrasound was performed which did show DVT in the right femoral vein, popliteal vein, calf vein.  Due to the shortness of breath, CTA of the chest was performed which did show multiple bilateral filling defects of the upper and lower arteries.  There is some right heart strain with RV to LV ratio measuring 1.5.  Patient started on IV heparin.  Critical care was consulted.  They recommended admission to the hospitalist service.  Patient transferred to [facility]. ? ?Patient states that he still has some chest pressure but it is improved.  He feels comfortable on oxygen. ? ?Patient admits to right leg swelling.  No pain in his leg. ? ?[facility] hospitalist contacted for admission. ?

## 2021-07-27 NOTE — TOC Benefit Eligibility Note (Signed)
Patient Advocate Encounter ? ?Insurance verification completed.   ? ?The patient is currently admitted and upon discharge could be taking Eliquis Starter Pack. ? ?The current 30 day co-pay is, $198.09.  ? ?The patient is insured through [employer]  ? ? ? ?Lyndel Safe, CPhT ?Pharmacy Patient Advocate Specialist ?[place] Patient Advocate Team ?Direct Number: [phone]  Fax: [phone] ? ? ? ? ? ?  ?

## 2021-07-27 NOTE — H&P (Signed)
?History and Physical  ? ? ?[facility] HBZ:[id number] DOB: [birth year] DOA: 07/26/2021 ? ?DOS: the patient was seen and examined on 07/26/2021 ? ?PCP: Prince Solian, MD  ? ?Patient coming from: Home ? ?I have personally briefly reviewed patient's old medical records in [place] ? ?CC: SOB, right leg swelling, CP with exertion ?HPI: ?66 year old African-American male with a history of hypertension, hypothyroidism, history of prostate cancer presents to the ER with 3 to 4 days of worsening shortness of breath, right leg swelling, chest pain with exertion.  Patient was recently status post cervical spine surgery on 07/17/2021 at [facility] hospital for C4-7 laminoplasty and a hemilaminectomy.  Patient went to the ER today due to 4 days of shortness of breath, dyspnea exertion, chest pain with exertion, right leg swelling.  Wife states the patient has been basically sitting in a recliner with his feet down for the last week.  He has not moved very much per day.  He is been able to get to the bathroom but that is about all.  Patient was not taking anything for DVT prophylaxis postsurgery. ? ?On arrival to the ER, temp 98.3 heart rate 110 blood pressure 119/81 satting 95% on room air.  O2 saturations dropped to 89% patient started on 2 L of oxygen. ? ?Due to his right leg swelling, right lower extremity ultrasound was performed which did show DVT in the right femoral vein, popliteal vein, calf vein.  Due to the shortness of breath, CTA of the chest was performed which did show multiple bilateral filling defects of the upper and lower arteries.  There is some right heart strain with RV to LV ratio measuring 1.5.  Patient started on IV heparin.  Critical care was consulted.  They recommended admission to the hospitalist service.  Patient transferred to [facility]. ? ?Patient states that he still has some chest pressure but it is improved.  He feels comfortable on oxygen. ? ?Patient admits to right leg  swelling.  No pain in his leg. ? ?[facility] hospitalist contacted for admission.  ? ?ED Course: right LE U/S positive for DVT. CTPA positive for PE ? ?Review of Systems:  ?Review of Systems  ?Constitutional: Negative.   ?HENT: Negative.    ?Eyes: Negative.   ?Respiratory:  Positive for shortness of breath.   ?Cardiovascular:  Positive for chest pain and leg swelling.  ?Gastrointestinal: Negative.   ?Genitourinary: Negative.   ?Musculoskeletal:  Positive for neck pain.  ?Skin: Negative.   ?Neurological: Negative.   ?Endo/Heme/Allergies: Negative.   ?Psychiatric/Behavioral: Negative.    ?All other systems reviewed and are negative. ? ?Past Medical History:  ?Diagnosis Date  ? Allergy   ? Arthritis   ? osteo  ? CTS (carpal tunnel syndrome)   ? DJD (degenerative joint disease)   ? DM2 (diabetes mellitus, type 2) ([place])   ? GERD (gastroesophageal reflux disease)   ? Gout   ? History of adenomatous polyps of colon 12/29/2019  ? History of nuclear stress test   ? Myoview 11/17: EF 54, no ST changes, hypertensive blood pressure response, no ischemia, low risk  ? Hyperlipidemia   ? Hypothyroidism   ? Nicotine addiction   ? OSA (obstructive sleep apnea)   ? Prostatitis   ? Reactive airway disease   ? Sarcocystosis   ? Sinus arrhythmia   ? Sleep apnea   ? cpap- not wearing  ? Tinea corporis   ? ? ?Past Surgical History:  ?Procedure Laterality Date  ?  BILATERAL KNEE ARTHROSCOPY    ? right- 1997; left 2001  ? COLONOSCOPY  multiple last 2013  ? KNEE SURGERY Right 2014  ? torn R medial meniscus- arthroscopy done  ? PARATHYROIDECTOMY  2005  ? ROTATOR CUFF REPAIR  2004  ? right  ? TONSILLECTOMY    ? TOTAL HIP ARTHROPLASTY Left 2009  ? TOTAL HIP ARTHROPLASTY Right 2006  ? UPPER GASTROINTESTINAL ENDOSCOPY    ? ? ? reports that he has quit smoking. His smoking use included cigarettes. He smoked an average of .5 packs per day. He has never used smokeless tobacco. He reports current alcohol use of about 21.0 standard drinks per week. He  reports that he does not use drugs. ? ?Allergies  ?Allergen Reactions  ? Iohexol   ?   Code: RASH, Desc: rash 2 years ago, needs 13 hr prep for future exams ?  ? Penicillins Hives  ? Shellfish Allergy Swelling  ? ? ?Family History  ?Problem Relation Age of Onset  ? Stroke Mother   ? Diabetes Father   ? Heart disease Father   ?     sp CABG  ? Diabetes Paternal Grandmother   ? Colon cancer Neg Hx   ? Stomach cancer Neg Hx   ? Esophageal cancer Neg Hx   ? Rectal cancer Neg Hx   ? Prostate cancer Neg Hx   ? ? ?Prior to Admission medications   ?Medication Sig Start Date End Date Taking? Authorizing Provider  ?albuterol (VENTOLIN HFA) 108 (90 Base) MCG/ACT inhaler Inhale 2 puffs into the lungs every 4 (four) hours. 03/07/21   [provider]  ?allopurinol (ZYLOPRIM) 300 MG tablet Take 300 mg by mouth daily.  06/30/11   [provider]  ?azithromycin (ZITHROMAX) 250 MG tablet Take 250-500 mg by mouth as directed. ?Patient not taking: Reported on 05/17/2021 03/07/21   [provider]  ?chlorpheniramine-HYDROcodone (TUSSIONEX) 10-8 MG/5ML SUER Take 5 mLs by mouth every 12 (twelve) hours as needed for cough. 03/07/21   [provider]  ?CRESTOR 20 MG tablet Take 20 mg by mouth daily.  06/30/11   [provider]  ?diazepam (VALIUM) 5 MG tablet Take 5 mg by mouth as needed. 04/24/21   [provider]  ?ergocalciferol (VITAMIN D2) 1.25 MG (50000 UT) capsule Take 50,000 Units by mouth once a week. Monday ?Patient not taking: Reported on 05/17/2021 08/15/20   [provider]  ?fluticasone (FLONASE) 50 MCG/ACT nasal spray Place 1 spray into the nose 2 (two) times daily as needed for allergies. 06/30/11   [provider]  ?folic acid (FOLVITE) 1 MG tablet Take 1 tablet (1 mg total) by mouth daily. ?Patient not taking: Reported on 05/17/2021 03/12/21   Florencia Reasons, MD  ?levothyroxine (SYNTHROID, LEVOTHROID) 50 MCG tablet Take 50 mcg by mouth daily. 02/18/15   [provider]  ?methylPREDNISolone (MEDROL DOSEPAK) 4 MG TBPK tablet Take 4-20 mg by mouth See admin instructions. 03/07/21   [provider]  ?naproxen sodium (ALEVE) 220 MG tablet Take 440 mg by mouth 2 (two) times daily as needed (pain).    [provider]  ?omeprazole (PRILOSEC) 40 MG capsule Take 40 mg by mouth 2 (two) times daily. 01/31/21   [provider]  ?oxyCODONE-acetaminophen (PERCOCET/ROXICET) 5-325 MG tablet Take 1 tablet by mouth every 8 (eight) hours as needed for severe pain. ?Patient not taking: Reported on 05/17/2021 03/11/21   Florencia Reasons, MD  ?[facility] HANDIHALER 18 MCG inhalation capsule Place 1  capsule into inhaler and inhale daily. 01/30/21   [provider]  ?thiamine 100 MG tablet Take 1 tablet (100 mg total) by mouth daily. ?Patient not taking: Reported on 05/17/2021 03/12/21   Florencia Reasons, MD  ?[facility] INHUB 250-50 MCG/ACT AEPB Inhale 1 puff into the lungs 2 (two) times daily. 01/30/21   [provider]  ? ? ?Physical Exam: ?Vitals:  ? 07/27/21 0215 07/27/21 0230 07/27/21 0300 07/27/21 0322  ?BP: 118/86 111/81  120/76  ?Pulse: 88 91  96  ?Resp: '18 18  20  '$ ?Temp:    97.6 ?F (36.4 ?C)  ?TempSrc:    Oral  ?SpO2: 95% 95%  95%  ?Weight:   107.7 kg   ?Height:   '5\' 10"'$  (1.778 m)   ? ? ?Physical Exam ?Vitals and nursing note reviewed.  ?Constitutional:   ?   General: He is not in acute distress. ?   Appearance: Normal appearance. He is obese. He is not ill-appearing, toxic-appearing or diaphoretic.  ?HENT:  ?   Head: Normocephalic and atraumatic.  ?   Nose: Nose normal. No rhinorrhea.  ?Cardiovascular:  ?   Rate and Rhythm: Regular rhythm. Tachycardia present.  ?   Pulses: Normal pulses.  ?Pulmonary:  ?   Effort: Pulmonary effort is normal. No respiratory distress.  ?   Breath sounds: Normal breath sounds. No wheezing or rales.  ?Abdominal:  ?   General: Bowel sounds are normal. There is no distension.  ?   Palpations: Abdomen is soft. There is no mass.  ?    Tenderness: There is no abdominal tenderness. There is no guarding.  ?   Hernia: No hernia is present.  ?Musculoskeletal:  ?   Right lower leg: Edema present.  ?Skin: ?   General: Skin is warm and dry.  ?   Capillary Refill

## 2021-07-27 NOTE — Assessment & Plan Note (Signed)
Continue with supplemental O2 

## 2021-07-28 ENCOUNTER — Other Ambulatory Visit (HOSPITAL_COMMUNITY): Payer: Self-pay

## 2021-07-28 LAB — CBC
HCT: 35.4 % — ABNORMAL LOW (ref 39.0–52.0)
Hemoglobin: 11.6 g/dL — ABNORMAL LOW (ref 13.0–17.0)
MCH: 30.1 pg (ref 26.0–34.0)
MCHC: 32.8 g/dL (ref 30.0–36.0)
MCV: 91.7 fL (ref 80.0–100.0)
Platelets: 168 10*3/uL (ref 150–400)
RBC: 3.86 MIL/uL — ABNORMAL LOW (ref 4.22–5.81)
RDW: 13.2 % (ref 11.5–15.5)
WBC: 8.4 10*3/uL (ref 4.0–10.5)
nRBC: 0 % (ref 0.0–0.2)

## 2021-07-28 LAB — GLUCOSE, CAPILLARY
Glucose-Capillary: 102 mg/dL — ABNORMAL HIGH (ref 70–99)
Glucose-Capillary: 100 mg/dL — ABNORMAL HIGH (ref 70–99)
Glucose-Capillary: 108 mg/dL — ABNORMAL HIGH (ref 70–99)

## 2021-07-28 LAB — APTT: aPTT: 97 seconds — ABNORMAL HIGH (ref 24–36)

## 2021-07-28 LAB — HEPARIN LEVEL (UNFRACTIONATED): Heparin Unfractionated: >1.1 IU/mL — ABNORMAL HIGH (ref 0.30–0.70)

## 2021-07-28 MED ORDER — APIXABAN 5 MG PO TABS
5.0000 mg | ORAL_TABLET | Freq: Two times a day (BID) | ORAL | Status: DC
Start: 2021-08-04 — End: 2021-07-28

## 2021-07-28 MED ORDER — APIXABAN 5 MG PO TABS
10.0000 mg | ORAL_TABLET | Freq: Two times a day (BID) | ORAL | Status: DC
Start: 2021-07-28 — End: 2021-07-28
  Administered 2021-07-28: 10 mg via ORAL
  Filled 2021-07-28: qty 2

## 2021-07-28 MED ORDER — APIXABAN (ELIQUIS) VTE STARTER PACK (10MG AND 5MG)
ORAL_TABLET | ORAL | 0 refills | Status: DC
  Filled 2021-07-28: qty 74, 30d supply, fill #0

## 2021-07-28 NOTE — TOC Progression Note (Addendum)
Transition of Care (TOC) - Progression Note  ? ? ?Patient Details  ?Name: Kyle Delacruz ?MRN: [id number] ?Date of Birth: September 01, 1955 ? ?Transition of Care (TOC) CM/SW Contact  ?Angelita Ingles, RN ?Phone [phone] ? ?07/28/2021, 2:22 PM ? ?Clinical Narrative:    ?CM received message from [place] stating that patients is potential code 24 and will have ambulatory sat check. Per UR nurse  Rod Holler Miringu) patient is not code 5 and per ambulatory sat note patient doesn not require home O2. No TOC needs ? ?1539 CM previously received call from patients nurse stating that patient is discharging and the wife and patient are requesting a walker. CM called [facility] and per [employer] insurance will not pay for walker if there are no PT recommendations.  Currently there are no PT notes documented. Message has been sent to nurse to make her aware.  ? ? ?  ?  ? ?Expected Discharge Plan and Services ?  ?  ?  ?  ?  ?Expected Discharge Date: 07/28/21               ?  ?  ?  ?  ?  ?  ?  ?  ?  ?  ? ? ?Social Determinants of Health (SDOH) Interventions ?  ? ?Readmission Risk Interventions ?No flowsheet data found. ? ?

## 2021-07-28 NOTE — Progress Notes (Signed)
SATURATION QUALIFICATIONS: (This note is used to comply with regulatory documentation for home oxygen) ? ?Patient Saturations on Room Air at Rest = 96% ? ?Patient Saturations on Room Air while Ambulating = 95% ? ?Patient Saturations on 0 Liters of oxygen while Ambulating = 93% ? ?Please briefly explain why patient needs home oxygen:  ?Patient ambulated without complaining of shortness of breath, oxygen level remained above 90%. Jceon Alverio Loel Ro ? ?

## 2021-07-28 NOTE — Progress Notes (Signed)
ANTICOAGULATION CONSULT NOTE ? ?Pharmacy Consult for Heparin ?Indication: pulmonary embolus & DVT ? ?Allergies  ?Allergen Reactions  ? Bee Venom   ?  Other reaction(s): Unknown  ? Iohexol Other (See Comments)  ?   Code: RASH, Desc: rash 2 years ago, needs 13 hr prep for future exams ?  ? Lisinopril   ?  Other reaction(s): Unknown  ? Metoclopramide Other (See Comments)  ?  Other reaction(s): shaking too bad ?"Sweat like crazy" ?  ? Penicillins Hives  ? Shellfish Allergy Swelling  ? ? ?Patient Measurements: ?Height: '5\' 10"'$  (177.8 cm) ?Weight: 106.6 kg (235 lb 0.2 oz) ?IBW/kg (Calculated) : 73 ? ?Heparin Dosing Weight: 96 kg ? ?Vital Signs: ?Temp: 98 ?F (36.7 ?C) (03/17 1119) ?Temp Source: Oral (03/17 1119) ?BP: 100/69 (03/17 1119) ?Pulse Rate: 90 (03/17 1119) ? ?Labs: ?Recent Labs  ?  07/26/21 ?1830 07/26/21 ?2351 07/27/21 ?2355 07/27/21 ?1328 07/28/21 ?0344  ?HGB 13.1  --  12.1*  --  11.6*  ?HCT 39.7  --  36.7*  --  35.4*  ?PLT 155  --  162  --  168  ?APTT  --   --  84* 86* 97*  ?HEPARINUNFRC  --   --  >1.10*  --  >1.10*  ?CREATININE 0.91  --  1.06  --   --   ?TROPONINIHS  --  6 10  --   --   ? ? ?Estimated Creatinine Clearance: 86 mL/min (by C-G formula based on SCr of 1.06 mg/dL). ? ? ?Assessment: ?66 yo male who presented with leg swelling and SOB found to have a submassive PE w/ RHS and DVTs in right femoral, popliteal, and calf. Patient not on anticoagulation PTA. Pharmacy consulted to dose heparin.  ? ?While in [facility] ED, x1 dose of apixaban '10mg'$  PO was given at 1800 on 03/15. The patient decompensated with soft pressures and worsening oxygen requirements. Decision was then to transition patient to IV heparin for PE management.   ? ?Heparin level remains artificially elevated. aPTT within goal range at 97 seconds. CBC stable, no s/sx bleeding reported.  ? ?Goal of Therapy:  ?Heparin level 0.3-0.7 units/ml ?aPTT 66-102 seconds ?Monitor platelets by anticoagulation protocol: Yes ?  ?Plan:  ?Stop heparin infusion at  1300 ?Start apixaban '10mg'$  twice daily x7 days, followed by apixaban '5mg'$  twice daily ?Continue to monitor H&H and platelets ? ? ? ?Thank you for allowing pharmacy to be a part of this patient?s care. ? ?Ardyth Harps, PharmD ?Clinical Pharmacist ? ?

## 2021-07-28 NOTE — Discharge Summary (Signed)
[facility] FFM:[id number] DOB: 31-Oct-1955 DOA: 07/26/2021 ? ?PCP: Prince Solian, MD ? ?Admit date: 07/26/2021 ?Discharge date: 07/28/2021 ? ?Admitted From: Home ?Disposition: Home ? ?Recommendations for Outpatient Follow-up:  ?Follow up with PCP in 1 week ?Please obtain BMP/CBC in one week ? ? ? ?Discharge Condition:Stable ?CODE STATUS: Full ?Diet recommendation: Heart Healthy ? ?Brief/Interim Summary: ?Per HPI:66 year old African-American male with a history of hypertension, hypothyroidism, history of prostate cancer presents to the ER with 3 to 4 days of worsening shortness of breath, right leg swelling, chest pain with exertion.  Patient was recently status post cervical spine surgery on 07/17/2021 at [facility] hospital for C4-7 laminoplasty and a hemilaminectomy.  Patient went to the ER today due to 4 days of shortness of breath, dyspnea exertion, chest pain with exertion, right leg swelling.  Wife states the patient has been basically sitting in a recliner with his feet down for the last week.  He has not moved very much per day.  He is been able to get to the bathroom but that is about all.  Patient was not taking anything for DVT prophylaxis postsurgery. ?  ?On arrival to the ER, temp 98.3 heart rate 110 blood pressure 119/81 satting 95% on room air.  O2 saturations dropped to 89% patient started on 2 L of oxygen. ?  ?Due to his right leg swelling, right lower extremity ultrasound was performed which did show DVT in the right femoral vein, popliteal vein, calf vein.  Due to the shortness of breath, CTA of the chest was performed which did show multiple bilateral filling defects of the upper and lower arteries.  There is some right heart strain with RV to LV ratio measuring 1.5.  Patient started on IV heparin.  Critical care was consulted.  They recommended admission to the hospitalist service.  Patient transferred to [facility]. ?He was started on heparin drip. ?Weaned off oxygen with 02 sats 93%.  He is transitioned to Eliquis and stable for discharge. ? ?# Provoked pulmonary embolism with radiographic right heart strain ?# RLE DVT ?PCCM  consulted ?Started on iV heparin, transitioned to [place] with plan for 3 month course at least. Needs to f/u with pcp for further management. ?Echocardiogram with normal EF.  Right ventricle systolic function is normal.  Right ventricular size is normal. ?Where ECS 20 to 30 mmHg to prevent post thrombotic syndrome ?  ?Acute respiratory failure with hypoxia ([place]) ?Due to pulmonary embolism ?Weaned off of to room air satting above 92% ?  ?Essential hypertension ?stable ?  ?Chronic obstructive pulmonary disease (HCC) ?stable ?  ?Carcinoma of prostate ([place]) ?stable ?  ?Hypothyroidism ?Continue home meds ? ? ? ?Discharge Diagnoses:  ?Principal Problem: ?  Acute pulmonary embolism ([place]) ?Active Problems: ?  Acute respiratory failure with hypoxia ([place]) ?  Hypothyroidism ?  Carcinoma of prostate ([place]) ?  Chronic obstructive pulmonary disease (HCC) ?  Essential hypertension ? ? ? ?Discharge Instructions ? ?Discharge Instructions   ? ? Call MD for:  difficulty breathing, headache or visual disturbances   Complete by: As directed ?  ? Diet - low sodium heart healthy   Complete by: As directed ?  ? Discharge instructions   Complete by: As directed ?  ? Avoid NSAIDS ?Avoid drinking ?F/u with pcp in one week, will need blood thinners for 3 months. She will need to give you refills. ?Wear compression stockings knee high 20-31mHg. Brands: sigvaris, medi, juzo.  ? Increase activity slowly   Complete  by: As directed ?  ? ?  ? ?Allergies as of 07/28/2021   ? ?   Reactions  ? Bee Venom   ? Other reaction(s): Unknown  ? Iohexol Other (See Comments)  ?  Code: RASH, Desc: rash 2 years ago, needs 13 hr prep for future exams  ? Lisinopril   ? Other reaction(s): Unknown  ? Metoclopramide Other (See Comments)  ? Other reaction(s): shaking too bad ?"Sweat like crazy"  ? Penicillins Hives  ? Shellfish  Allergy Swelling  ? ?  ? ?  ?Medication List  ?  ? ?TAKE these medications   ? ?allopurinol 300 MG tablet ?Commonly known as: ZYLOPRIM ?Take 300 mg by mouth daily. ?  ?Crestor 20 MG tablet ?Generic drug: rosuvastatin ?Take 20 mg by mouth daily. ?  ?cyclobenzaprine 10 MG tablet ?Commonly known as: FLEXERIL ?Take 10 mg by mouth 3 (three) times daily as needed for muscle spasms. ?  ?Eliquis DVT/PE Starter Pack ?Generic drug: Apixaban Starter Pack ('10mg'$  and '5mg'$ ) ?Take as directed on package: start with two-'5mg'$  tablets twice daily for 7 days. On day 8, switch to one-'5mg'$  tablet twice daily. ?  ?HYDROcodone-acetaminophen 10-325 MG tablet ?Commonly known as: NORCO ?Take 1 tablet by mouth every 4 (four) hours as needed for pain. ?  ?levothyroxine 50 MCG tablet ?Commonly known as: SYNTHROID ?Take 50 mcg by mouth daily. ?  ?omeprazole 40 MG capsule ?Commonly known as: PRILOSEC ?Take 40 mg by mouth 2 (two) times daily. ?  ? ?  ? ? Follow-up Information   ? ? [name], [name], MD Follow up in 1 week(s).   ?Specialty: Internal Medicine ?Why: Please follow in a week. ?Contact information: ?[address] ?[place] [address] ?[phone] ? ? ?  ?  ? ?  ?  ? ?  ? ?Allergies  ?Allergen Reactions  ? Bee Venom   ?  Other reaction(s): Unknown  ? Iohexol Other (See Comments)  ?   Code: RASH, Desc: rash 2 years ago, needs 13 hr prep for future exams ?  ? Lisinopril   ?  Other reaction(s): Unknown  ? Metoclopramide Other (See Comments)  ?  Other reaction(s): shaking too bad ?"Sweat like crazy" ?  ? Penicillins Hives  ? Shellfish Allergy Swelling  ? ? ?Consultations: ?PCCM ? ? ?Procedures/Studies: ?CT Angio Chest PE W/Cm &/Or Wo Cm ? ?Result Date: 07/26/2021 ?CLINICAL DATA:  Pulmonary embolus suspected with high probability. Recent surgery with shortness of breath since discharge. EXAM: CT ANGIOGRAPHY CHEST WITH CONTRAST TECHNIQUE: Multidetector CT imaging of the chest was performed using the standard protocol during bolus  administration of intravenous contrast. Multiplanar CT image reconstructions and MIPs were obtained to evaluate the vascular anatomy. RADIATION DOSE REDUCTION: This exam was performed according to the departmental dose-optimization program which includes automated exposure control, adjustment of the mA and/or kV according to patient size and/or use of iterative reconstruction technique. CONTRAST:  58m OMNIPAQUE IOHEXOL 350 MG/ML SOLN COMPARISON:  None. FINDINGS: Cardiovascular: Good opacification of the central and segmental pulmonary arteries. Multiple filling defects are demonstrated in the distal main pulmonary arteries bilaterally and extending into multiple bilateral upper and lower lobe segmental branches. This indicates acute pulmonary embolus. Measurement evidence of right heart strain with RV to LV ratio measuring 1.5. Dilatation of the right atrium. No pericardial effusions. Normal caliber thoracic aorta without dissection. Great vessel origins are patent. Calcification of the aorta and coronary arteries. Mediastinum/Nodes: No enlarged mediastinal, hilar, or axillary lymph nodes. Thyroid gland, trachea, and  esophagus demonstrate no significant findings. Lungs/Pleura: Focal consolidation or atelectasis in the right lower lung. No pleural effusions. Upper Abdomen: Layering density in the gallbladder likely represents sludge. Small stones are demonstrated in both kidneys. No hydronephrosis. Musculoskeletal: Degenerative changes.  No destructive bone lesions. Review of the MIP images confirms the above findings. IMPRESSION: Positive for acute PE with CT evidence of right heart strain (RV/LV Ratio = 1.5) consistent with at least submassive (intermediate risk) PE. The presence of right heart strain has been associated with an increased risk of morbidity and mortality. Please refer to the "PE Focused" order set in EPIC. Focal consolidation or atelectasis in the right lower lung. Probable sludge in the  gallbladder. Nonobstructing bilateral renal stones. Critical Value/emergent results were called by telephone at the time of interpretation on 07/26/2021 at 10:50 pm to provider Dr. Estevan Ryder, who verbally acknowledged these result

## 2021-07-28 NOTE — Plan of Care (Signed)

## 2021-07-28 NOTE — Discharge Instructions (Signed)
Information on my medicine - ELIQUIS? (apixaban) ? ?This medication education was reviewed with me or my healthcare representative as part of my discharge preparation.  ? ?Why was Eliquis? prescribed for you? ?Eliquis? was prescribed to treat blood clots that may have been found in the veins of your legs (deep vein thrombosis) or in your lungs (pulmonary embolism) and to reduce the risk of them occurring again. ? ?What do You need to know about Eliquis? ? ?The starting dose is 10 mg (two 5 mg tablets) taken TWICE daily for the FIRST SEVEN (7) DAYS, then on 08/04/2021  the dose is reduced to ONE 5 mg tablet taken TWICE daily.  Eliquis? may be taken with or without food.  ? ?Try to take the dose about the same time in the morning and in the evening. If you have difficulty swallowing the tablet whole please discuss with your pharmacist how to take the medication safely. ? ?Take Eliquis? exactly as prescribed and DO NOT stop taking Eliquis? without talking to the doctor who prescribed the medication.  Stopping may increase your risk of developing a new blood clot.  Refill your prescription before you run out. ? ?After discharge, you should have regular check-up appointments with your healthcare provider that is prescribing your Eliquis?. ?   ?What do you do if you miss a dose? ?If a dose of ELIQUIS? is not taken at the scheduled time, take it as soon as possible on the same day and twice-daily administration should be resumed. The dose should not be doubled to make up for a missed dose. ? ?Important Safety Information ?A possible side effect of Eliquis? is bleeding. You should call your healthcare provider right away if you experience any of the following: ?Bleeding from an injury or your nose that does not stop. ?Unusual colored urine (red or dark brown) or unusual colored stools (red or black). ?Unusual bruising for unknown reasons. ?A serious fall or if you hit your head (even if there is no bleeding). ? ?Some  medicines may interact with Eliquis? and might increase your risk of bleeding or clotting while on Eliquis?Marland Kitchen To help avoid this, consult your healthcare provider or pharmacist prior to using any new prescription or non-prescription medications, including herbals, vitamins, non-steroidal anti-inflammatory drugs (NSAIDs) and supplements. ? ?This website has more information on Eliquis? (apixaban): http://www.eliquis.com/eliquis/home ? ?

## 2021-08-01 ENCOUNTER — Other Ambulatory Visit (HOSPITAL_COMMUNITY): Payer: Self-pay

## 2021-08-01 ENCOUNTER — Telehealth (HOSPITAL_BASED_OUTPATIENT_CLINIC_OR_DEPARTMENT_OTHER): Payer: Self-pay

## 2021-08-01 NOTE — Telephone Encounter (Signed)
Pharmacy Transitions of Care Follow-up Telephone Call ? ?Date of discharge: 07/28/21  ?Discharge Diagnosis: Pe/dvt ? ?How have you been since you were released from the hospital? Patient has been doing well now that he is home. Understood how to take Eliquis. No other drug changes were made.  ? ?Medication changes made at discharge: ?START taking these medications ? ?START taking these medications  ?Eliquis DVT/PE Starter Pack ?Generic drug: Apixaban Starter Pack ('10mg'$  and '5mg'$ ) ?Take as directed on package: start with two-'5mg'$  tablets twice daily for 7 days. On day 8, switch to one-'5mg'$  tablet twice daily.  ? ?CONTINUE taking these medications ? ?CONTINUE taking these medications  ?allopurinol 300 MG tablet ?Commonly known as: ZYLOPRIM  ?Crestor 20 MG tablet ?Generic drug: rosuvastatin  ?cyclobenzaprine 10 MG tablet ?Commonly known as: FLEXERIL  ?HYDROcodone-acetaminophen 10-325 MG tablet ?Commonly known as: NORCO  ?levothyroxine 50 MCG tablet ?Commonly known as: SYNTHROID  ?omeprazole 40 MG capsule ?Commonly known as: PRILOSEC  ? ? ?Medication changes verified by the patient? Yes  ?  ? ?Medication Accessibility: ? ?Home Pharmacy: [facility] [id number]  ? ?Was the patient provided with refills on discharged medications? no  ? ?Have all prescriptions been transferred from [facility] to home pharmacy? N/a  ? ?Medication Review:  ?APIXABAN (ELIQUIS)  ?Apixaban 10 mg BID initiated on 07/28/21. Will switch to apixaban 5 mg BID after 7 days (DATE 08/04/21). Continue x 3 months. ?- Discussed importance of taking medication around the same time everyday  ?- Reviewed potential DDIs with patient  ?- Advised patient of medications to avoid (NSAIDs, ASA)  ?- Educated that Tylenol (acetaminophen) will be the preferred analgesic to prevent risk of bleeding  ?- Emphasized importance of monitoring for signs and symptoms of bleeding (abnormal bruising, prolonged bleeding, nose bleeds, bleeding from gums, discolored urine, black tarry stools)  ?- Advised  patient to alert all providers of anticoagulation therapy prior to starting a new medication or having a procedure  ? ?Follow-up Appointments: ?Appointment tomorrow at [place] with Berneta Sages. ? ?If their condition worsens, is the pt aware to call PCP or go to the Emergency Dept.? yes ? ?Final Patient Assessment: ?Patient has been doing well now that he is home. Understood how to take Eliquis. No other drug changes were made.  ? ?Darcus Austin, PharmD ?Clinical Pharmacist ?[facility] ?08/01/2021 4:18 PM ? ?

## 2021-08-02 DIAGNOSIS — Z9889 Other specified postprocedural states: Secondary | ICD-10-CM | POA: Diagnosis not present

## 2021-08-02 DIAGNOSIS — I2609 Other pulmonary embolism with acute cor pulmonale: Secondary | ICD-10-CM | POA: Diagnosis not present

## 2021-08-02 DIAGNOSIS — I82411 Acute embolism and thrombosis of right femoral vein: Secondary | ICD-10-CM | POA: Diagnosis not present

## 2021-08-02 DIAGNOSIS — I10 Essential (primary) hypertension: Secondary | ICD-10-CM | POA: Diagnosis not present

## 2021-08-11 DIAGNOSIS — M542 Cervicalgia: Secondary | ICD-10-CM | POA: Diagnosis not present

## 2021-08-14 DIAGNOSIS — M25532 Pain in left wrist: Secondary | ICD-10-CM | POA: Diagnosis not present

## 2021-08-14 DIAGNOSIS — M654 Radial styloid tenosynovitis [de Quervain]: Secondary | ICD-10-CM | POA: Diagnosis not present

## 2021-09-05 DIAGNOSIS — R5383 Other fatigue: Secondary | ICD-10-CM | POA: Diagnosis not present

## 2021-09-05 DIAGNOSIS — Z1152 Encounter for screening for COVID-19: Secondary | ICD-10-CM | POA: Diagnosis not present

## 2021-09-05 DIAGNOSIS — G4733 Obstructive sleep apnea (adult) (pediatric): Secondary | ICD-10-CM | POA: Diagnosis not present

## 2021-09-05 DIAGNOSIS — R0981 Nasal congestion: Secondary | ICD-10-CM | POA: Diagnosis not present

## 2021-09-05 DIAGNOSIS — J309 Allergic rhinitis, unspecified: Secondary | ICD-10-CM | POA: Diagnosis not present

## 2021-09-05 DIAGNOSIS — R051 Acute cough: Secondary | ICD-10-CM | POA: Diagnosis not present

## 2021-09-11 DIAGNOSIS — M654 Radial styloid tenosynovitis [de Quervain]: Secondary | ICD-10-CM | POA: Diagnosis not present

## 2021-10-02 DIAGNOSIS — N200 Calculus of kidney: Secondary | ICD-10-CM | POA: Diagnosis not present

## 2021-10-02 DIAGNOSIS — K5730 Diverticulosis of large intestine without perforation or abscess without bleeding: Secondary | ICD-10-CM | POA: Diagnosis not present

## 2021-10-02 DIAGNOSIS — K3189 Other diseases of stomach and duodenum: Secondary | ICD-10-CM | POA: Diagnosis not present

## 2021-10-06 ENCOUNTER — Ambulatory Visit (HOSPITAL_COMMUNITY)
Admission: RE | Admit: 2021-10-06 | Discharge: 2021-10-06 | Disposition: A | Discharge status: Home / Self Care | Payer: Medicare Other | Source: Ambulatory Visit | Attending: Internal Medicine | Admitting: Internal Medicine

## 2021-10-06 ENCOUNTER — Other Ambulatory Visit (HOSPITAL_COMMUNITY): Payer: Self-pay | Admitting: Internal Medicine

## 2021-10-06 DIAGNOSIS — I82401 Acute embolism and thrombosis of unspecified deep veins of right lower extremity: Secondary | ICD-10-CM

## 2021-10-12 DIAGNOSIS — M48062 Spinal stenosis, lumbar region with neurogenic claudication: Secondary | ICD-10-CM | POA: Diagnosis not present

## 2021-10-25 DIAGNOSIS — M48062 Spinal stenosis, lumbar region with neurogenic claudication: Secondary | ICD-10-CM | POA: Diagnosis not present

## 2021-11-06 ENCOUNTER — Encounter (HOSPITAL_COMMUNITY): Payer: Medicare Other

## 2021-11-16 DIAGNOSIS — M48062 Spinal stenosis, lumbar region with neurogenic claudication: Secondary | ICD-10-CM | POA: Diagnosis not present

## 2021-11-28 DIAGNOSIS — M48062 Spinal stenosis, lumbar region with neurogenic claudication: Secondary | ICD-10-CM | POA: Diagnosis not present

## 2021-12-22 ENCOUNTER — Other Ambulatory Visit (HOSPITAL_COMMUNITY): Payer: Self-pay | Admitting: Internal Medicine

## 2021-12-22 ENCOUNTER — Ambulatory Visit (HOSPITAL_COMMUNITY)
Admission: RE | Admit: 2021-12-22 | Discharge: 2021-12-22 | Disposition: A | Discharge status: Home / Self Care | Payer: Medicare Other | Source: Ambulatory Visit | Attending: Vascular Surgery | Admitting: Vascular Surgery

## 2021-12-22 DIAGNOSIS — Z86718 Personal history of other venous thrombosis and embolism: Secondary | ICD-10-CM

## 2021-12-29 DIAGNOSIS — M5431 Sciatica, right side: Secondary | ICD-10-CM | POA: Diagnosis not present

## 2021-12-29 DIAGNOSIS — M5432 Sciatica, left side: Secondary | ICD-10-CM | POA: Diagnosis not present

## 2021-12-29 DIAGNOSIS — M48062 Spinal stenosis, lumbar region with neurogenic claudication: Secondary | ICD-10-CM | POA: Diagnosis not present

## 2022-01-02 DIAGNOSIS — J069 Acute upper respiratory infection, unspecified: Secondary | ICD-10-CM | POA: Diagnosis not present

## 2022-01-02 DIAGNOSIS — R051 Acute cough: Secondary | ICD-10-CM | POA: Diagnosis not present

## 2022-01-02 DIAGNOSIS — R5383 Other fatigue: Secondary | ICD-10-CM | POA: Diagnosis not present

## 2022-01-02 DIAGNOSIS — R062 Wheezing: Secondary | ICD-10-CM | POA: Diagnosis not present

## 2022-01-02 DIAGNOSIS — Z1152 Encounter for screening for COVID-19: Secondary | ICD-10-CM | POA: Diagnosis not present

## 2022-01-02 DIAGNOSIS — J309 Allergic rhinitis, unspecified: Secondary | ICD-10-CM | POA: Diagnosis not present

## 2022-01-16 DIAGNOSIS — M4802 Spinal stenosis, cervical region: Secondary | ICD-10-CM | POA: Diagnosis not present

## 2022-01-16 DIAGNOSIS — E039 Hypothyroidism, unspecified: Secondary | ICD-10-CM | POA: Diagnosis not present

## 2022-01-16 DIAGNOSIS — C61 Malignant neoplasm of prostate: Secondary | ICD-10-CM | POA: Diagnosis not present

## 2022-01-16 DIAGNOSIS — R7989 Other specified abnormal findings of blood chemistry: Secondary | ICD-10-CM | POA: Diagnosis not present

## 2022-01-16 DIAGNOSIS — N289 Disorder of kidney and ureter, unspecified: Secondary | ICD-10-CM | POA: Diagnosis not present

## 2022-01-16 DIAGNOSIS — I5032 Chronic diastolic (congestive) heart failure: Secondary | ICD-10-CM | POA: Diagnosis not present

## 2022-01-16 DIAGNOSIS — Z01818 Encounter for other preprocedural examination: Secondary | ICD-10-CM | POA: Diagnosis not present

## 2022-01-16 DIAGNOSIS — E785 Hyperlipidemia, unspecified: Secondary | ICD-10-CM | POA: Diagnosis not present

## 2022-01-16 DIAGNOSIS — I82411 Acute embolism and thrombosis of right femoral vein: Secondary | ICD-10-CM | POA: Diagnosis not present

## 2022-01-16 DIAGNOSIS — I110 Hypertensive heart disease with heart failure: Secondary | ICD-10-CM | POA: Diagnosis not present

## 2022-01-16 DIAGNOSIS — E669 Obesity, unspecified: Secondary | ICD-10-CM | POA: Diagnosis not present

## 2022-01-16 DIAGNOSIS — I2510 Atherosclerotic heart disease of native coronary artery without angina pectoris: Secondary | ICD-10-CM | POA: Diagnosis not present

## 2022-01-16 DIAGNOSIS — E119 Type 2 diabetes mellitus without complications: Secondary | ICD-10-CM | POA: Diagnosis not present

## 2022-01-30 ENCOUNTER — Telehealth: Payer: Self-pay | Admitting: Interventional Cardiology

## 2022-01-30 NOTE — Telephone Encounter (Signed)
Patient called stating his PCP needs a letter stating that he is cleared surgery.  His PCP is Dr. Joyce Copa at [facility]. Patient stated the [place] that is doing the surgery didn't require one but the PCP is before they give him clearance.

## 2022-01-31 NOTE — Telephone Encounter (Signed)
Pt has been scheduled with Richardson Dopp, [facility] for pre op clearance 02/05/22 @ 8:50

## 2022-01-31 NOTE — Telephone Encounter (Signed)
   Name: Kyle Delacruz  DOB: February 14, 1956  MRN: [id number]  Primary Cardiologist: Sinclair Grooms, MD  Chart reviewed as part of pre-operative protocol coverage. Because of Kyle Delacruz's past medical history and time since last visit, he will require a follow-up in-office visit in order to better assess preoperative cardiovascular risk. Since last visit 05/2021, patient was admitted with hypoxic respiratory failure and PE in 07/2021 so would benefit from in-office evaluation.  Pre-op covering staff: - Please schedule appointment and call patient to inform them. If patient already had an upcoming appointment within acceptable timeframe, please add "pre-op clearance" to the appointment notes so provider is aware. - Regarding anticoagulation, he is on this for h/o PE and we do not manage this for him; started by medicine team. Please contact requesting surgeon's office via preferred method (i.e, phone, fax) to inform them of need for appointment prior to surgery and recommend they reach out to the provider managing his Eliquis for clearance to hold.  Charlie Pitter, PA-C  01/31/2022, 8:36 AM

## 2022-01-31 NOTE — Telephone Encounter (Signed)
   Pre-operative Risk Assessment    Patient Name: Kyle Delacruz  DOB: 1955/09/01 MRN: [id number]      Request for Surgical Clearance    Procedure:   L2-5 DECOMPRESSION / FUSION & TLIF  Date of Surgery:  Clearance TBD                                 Surgeon:  DR. Sherlyn Lick Surgeon's Group or Practice Name:  [place] Phone number:  [phone]  Fax number:  [phone]   Type of Clearance Requested:   - Medical  - Pharmacy:  Hold Apixaban (Eliquis) NOT INDICATED ON CLEARANCE    Type of Anesthesia:  General    Additional requests/questions:    Astrid Divine   01/31/2022, 7:16 AM

## 2022-02-04 DIAGNOSIS — Z01810 Encounter for preprocedural cardiovascular examination: Secondary | ICD-10-CM | POA: Insufficient documentation

## 2022-02-04 NOTE — Progress Notes (Signed)
Cardiology Office Note:    Date:  02/05/2022   ID:  Kyle Delacruz, Kyle Delacruz [birth year], MRN [id number]  PCP:  Kyle Delacruz, [place] [facility] Cardiologist:  Kyle Grooms, MD    Referring MD: Kyle Solian, MD   Chief Complaint:  Surgical Clearance    Patient Profile: FHx of CAD Myoview in 2017: low risk  Pre-Diabetes mellitus  Hyperlipidemia  Syncope  Echocardiogram 10/22: EF 60 Monitor 12/22: no arrhythmias  Hx of pulmonary embolism in March 2023 Echocardiogram 3/23: EF 60-65, normal RVSF  OSA +Cigs Hypothyroidism  GERD  Prostate CA   Prior CV Studies: GATED SPECT MYO PERF W/EXERCISE STRESS 1D 03/21/2016 EF 54, normal perfusion; low risk   ECHO COMPLETE WITH IMAGING ENHANCING AGENT 07/27/2021 EF 60-65, global HK, mild LVH, normal RVSF, trivial MR   ZIO MONITOR 05/02/2021 Overall normal study with low grade burden of PACs and PVCs.  VAS US CAROTID DUPLEX BILATERAL 03/11/2021 Right Carotid: Velocities in the right ICA are consistent with a 1-39% stenosis. Left Carotid: Velocities in the left ICA are consistent with a 1-39% stenosis.  History of Present Illness:   Kyle Delacruz is a 66 y.o. male with the above problem list.  He was seen by Dr. Tamala Delacruz in January 2023.  He returns for surgical clearance. He needs to undergo L2-5 decompression and fusion with Dr. Sherlyn Delacruz under general anesthesia. His Eliquis will need to be held. However, his Eliquis is for a hx of pulmonary embolism and this is not managed by Cardiology.  He is here today with his wife.  His back does limit him significantly.  He is having trouble walking now.  However, [facility]  He was [facility].  He has not had chest pain.  He does have shortness of breath with some activities.  He has not had syncope, orthopnea, significant leg edema.    Past Medical History:  Diagnosis Date   Allergy    Arthritis    osteo   CTS (carpal tunnel  syndrome)    DJD (degenerative joint disease)    DM2 (diabetes mellitus, type 2) (HCC)    GERD (gastroesophageal reflux disease)    Gout    History of adenomatous polyps of colon 12/29/2019   History of nuclear stress test    Myoview 11/17: EF 54, no ST changes, hypertensive blood pressure response, no ischemia, low risk   Hyperlipidemia    Hypothyroidism    Nicotine addiction    OSA (obstructive sleep apnea)    Prostatitis    Reactive airway disease    Sarcocystosis    Sinus arrhythmia    Sleep apnea    cpap- not wearing   Tinea corporis    Current Medications: Current Meds  Medication Sig   allopurinol (ZYLOPRIM) 300 MG tablet Take 300 mg by mouth daily.    APIXABAN (ELIQUIS) VTE STARTER PACK ('10MG'$  AND '5MG'$ ) Take as directed on package: start with two-'5mg'$  tablets twice daily for 7 days. On day 8, switch to one-'5mg'$  tablet twice daily.   CRESTOR 20 MG tablet Take 20 mg by mouth daily.    cyclobenzaprine (FLEXERIL) 10 MG tablet Take 10 mg by mouth 3 (three) times daily as needed for muscle spasms.   fluticasone (FLONASE) 50 MCG/ACT nasal spray INSTILL 2 SPRAYS IN EACH NOSTRIL DAILY   HYDROcodone-acetaminophen (NORCO) 10-325 MG tablet Take 1 tablet by mouth every 4 (four) hours as needed for  pain.   levothyroxine (SYNTHROID, LEVOTHROID) 50 MCG tablet Take 50 mcg by mouth daily.   omeprazole (PRILOSEC) 40 MG capsule Take 40 mg by mouth 2 (two) times daily.    Allergies:   Bee venom, Iohexol, Lisinopril, Metoclopramide, Penicillins, and Shellfish allergy   Social History   Tobacco Use   Smoking status: Former    Packs/day: 0.50    Types: Cigarettes   Smokeless tobacco: Never   Tobacco comments:    Quit March 2020  Vaping Use   Vaping Use: Never used  Substance Use Topics   Alcohol use: Yes    Alcohol/week: 21.0 standard drinks of alcohol    Types: 21 Cans of beer per week    Comment: 2 beers per day   Drug use: No    Family Hx: The patient's family history includes  Diabetes in his father and paternal grandmother; Heart disease in his father; Stroke in his mother. There is no history of Colon cancer, Stomach cancer, Esophageal cancer, Rectal cancer, or Prostate cancer.  Review of Systems  Constitutional: Negative for fever.  Respiratory:  Negative for cough.   Gastrointestinal:  Negative for hematochezia.     EKGs/Labs/Other Test Reviewed:    EKG:  EKG is  ordered today.  The ekg ordered today demonstrates NSR, HR 81, normal axis, no ST-TW changes, QTc 455 ms, no change from prior tracing   Recent Labs: 07/26/2021: B Natriuretic Peptide 46.5 07/27/2021: ALT 13; BUN 11; Creatinine, Ser 1.06; Magnesium 1.9; Potassium 4.3; Sodium 138; TSH 0.936 07/28/2021: Hemoglobin 11.6; Platelets 168   Recent Lipid Panel No results for input(s): "CHOL", "TRIG", "HDL", "VLDL", "LDLCALC", "LDLDIRECT" in the last 8760 hours.   Risk Assessment/Calculations/Metrics:              Physical Exam:    VS:  BP 130/64   Pulse 81   Ht 5' 9.5" (1.765 m)   Wt 235 lb 12.8 oz (107 kg)   SpO2 96%   BMI 34.32 kg/m     Wt Readings from Last 3 Encounters:  02/05/22 235 lb 12.8 oz (107 kg)  07/28/21 235 lb 0.2 oz (106.6 kg)  05/17/21 235 lb (106.6 kg)    Constitutional:      Appearance: Healthy appearance. Not in distress.  Neck:     Vascular: JVD normal.  Pulmonary:     Effort: Pulmonary effort is normal.     Breath sounds: No wheezing. No rales.  Cardiovascular:     Normal rate. Regular rhythm. Normal S1. Normal S2.      Murmurs: There is no murmur.  Edema:    Peripheral edema present.    Pretibial: trace edema of the right pretibial area. Abdominal:     Palpations: Abdomen is soft.  Skin:    General: Skin is warm and dry.  Neurological:     General: No focal deficit present.     Mental Status: Alert and oriented to person, place and time.         ASSESSMENT & PLAN:   Preoperative cardiovascular examination [name] perioperative risk of a major  cardiac event is 0.4% according to the Revised Cardiac Risk Index (RCRI).  Therefore, he is at low risk for perioperative complications.    Recommendations: According to ACC/AHA guidelines, no further cardiovascular testing needed.  The patient may proceed to surgery at acceptable risk.   Antiplatelet and/or Anticoagulation Recommendations: His anticoagulation was prescribed for pulmonary embolism. Management per primary care. However, he tells me this was  stopped recently.             Dispo:  Return for Follow up as needed.   Medication Adjustments/Labs and Tests Ordered: Current medicines are reviewed at length with the patient today.  Concerns regarding medicines are outlined above.  Tests Ordered: Orders Placed This Encounter  Procedures   EKG 12-Lead   Medication Changes: No orders of the defined types were placed in this encounter.  Signed, Richardson Dopp, PA-C  02/05/2022 9:34 AM    [facility] [place], [place],   [address] Phone: [phone]; Fax: [phone]

## 2022-02-05 ENCOUNTER — Encounter: Payer: Self-pay | Admitting: Physician Assistant

## 2022-02-05 ENCOUNTER — Ambulatory Visit: Payer: Medicare Other | Attending: Physician Assistant | Admitting: Physician Assistant

## 2022-02-05 VITALS — BP 130/64 | HR 81 | Ht 69.5 in | Wt 235.8 lb

## 2022-02-05 DIAGNOSIS — Z01810 Encounter for preprocedural cardiovascular examination: Secondary | ICD-10-CM | POA: Insufficient documentation

## 2022-02-05 NOTE — Patient Instructions (Signed)
Medication Instructions:  Your physician recommends that you continue on your current medications as directed. Please refer to the Current Medication list given to you today.  *If you need a refill on your cardiac medications before your next appointment, please call your pharmacy*   Lab Work: None ordered  If you have labs (blood work) drawn today and your tests are completely normal, you will receive your results only by: [place] (if you have MyChart) OR A paper copy in the mail If you have any lab test that is abnormal or we need to change your treatment, we will call you to review the results.   Testing/Procedures: None ordered   Follow-Up: At [facility], you and your health needs are our priority.  As part of our continuing mission to provide you with exceptional heart care, we have created designated Provider Care Teams.  These Care Teams include your primary Cardiologist (physician) and Advanced Practice Providers (APPs -  Physician Assistants and Nurse Practitioners) who all work together to provide you with the care you need, when you need it.  We recommend signing up for the patient portal called "MyChart".  Sign up information is provided on this After Visit Summary.  MyChart is used to connect with patients for Virtual Visits (Telemedicine).  Patients are able to view lab/test results, encounter notes, upcoming appointments, etc.  Non-urgent messages can be sent to your provider as well.   To learn more about what you can do with MyChart, go to [email or web address].    Your next appointment:   AS NEEDED  The format for your next appointment:     Provider:       Other Instructions   Important Information About Sugar

## 2022-02-05 NOTE — Assessment & Plan Note (Addendum)
Kyle Delacruz perioperative risk of a major cardiac event is 0.4% according to the Revised Cardiac Risk Index (RCRI).  Therefore, he is at low risk for perioperative complications.    Recommendations: According to ACC/AHA guidelines, no further cardiovascular testing needed.  The patient may proceed to surgery at acceptable risk.   Antiplatelet and/or Anticoagulation Recommendations: His anticoagulation was prescribed for pulmonary embolism. Management per primary care. However, he tells me this was stopped recently.

## 2022-02-13 ENCOUNTER — Ambulatory Visit (HOSPITAL_COMMUNITY)
Admission: RE | Admit: 2022-02-13 | Discharge: 2022-02-13 | Disposition: A | Discharge status: Home / Self Care | Payer: Medicare Other | Source: Ambulatory Visit | Attending: Vascular Surgery | Admitting: Vascular Surgery

## 2022-02-13 ENCOUNTER — Other Ambulatory Visit: Payer: Self-pay | Admitting: *Deleted

## 2022-02-13 DIAGNOSIS — Z86718 Personal history of other venous thrombosis and embolism: Secondary | ICD-10-CM | POA: Diagnosis not present

## 2022-02-14 ENCOUNTER — Encounter: Payer: Self-pay | Admitting: Vascular Surgery

## 2022-02-14 ENCOUNTER — Ambulatory Visit (INDEPENDENT_AMBULATORY_CARE_PROVIDER_SITE_OTHER): Payer: Medicare Other | Admitting: Vascular Surgery

## 2022-02-14 VITALS — BP 129/83 | HR 69 | Temp 98.2°F | Resp 20 | Ht 69.0 in | Wt 235.0 lb

## 2022-02-14 DIAGNOSIS — I82511 Chronic embolism and thrombosis of right femoral vein: Secondary | ICD-10-CM | POA: Diagnosis not present

## 2022-02-14 NOTE — Progress Notes (Signed)
Patient ID: Kyle Delacruz, male   DOB: Nov 08, 1955, 66 y.o.   MRN: [id number]  Reason for Consult: New Patient (Initial Visit)   Referred by Prince Solian, MD  Subjective:     HPI:  Kyle Delacruz is a 66 y.o. male with spine surgery in last March and developed right lower extremity DVT and PE for which she was on Eliquis and just recently stopped after 6 months of therapy.  He is now scheduled for lumbar spine surgery and is here for clearance from an anticoagulation standpoint.  States at the time of the original clot he had significant swelling in the right lower extremity and very short distance shortness of breath when trying to walk around the block.  This is all resolved.  He does have pain in the low back radiating down mostly the left leg at this point.  Past Medical History:  Diagnosis Date   Allergy    Arthritis    osteo   CTS (carpal tunnel syndrome)    DJD (degenerative joint disease)    DM2 (diabetes mellitus, type 2) (HCC)    DVT (deep venous thrombosis) (HCC)    GERD (gastroesophageal reflux disease)    Gout    History of adenomatous polyps of colon 12/29/2019   History of nuclear stress test    Myoview 11/17: EF 54, no ST changes, hypertensive blood pressure response, no ischemia, low risk   Hyperlipidemia    Hypothyroidism    Nicotine addiction    OSA (obstructive sleep apnea)    Prostatitis    Reactive airway disease    Sarcocystosis    Sinus arrhythmia    Sleep apnea    cpap- not wearing   Tinea corporis    Family History  Problem Relation Age of Onset   Stroke Mother    Diabetes Father    Heart disease Father        sp CABG   Diabetes Paternal Grandmother    Colon cancer Neg Hx    Stomach cancer Neg Hx    Esophageal cancer Neg Hx    Rectal cancer Neg Hx    Prostate cancer Neg Hx    Past Surgical History:  Procedure Laterality Date   BILATERAL KNEE ARTHROSCOPY     right- 1997; left 2001   COLONOSCOPY  multiple last 2013   KNEE SURGERY  Right 2014   torn R medial meniscus- arthroscopy done   PARATHYROIDECTOMY  2005   ROTATOR CUFF REPAIR  2004   right   TONSILLECTOMY     TOTAL HIP ARTHROPLASTY Left 2009   TOTAL HIP ARTHROPLASTY Right 2006   UPPER GASTROINTESTINAL ENDOSCOPY      Short Social History:  Social History   Tobacco Use   Smoking status: Former    Packs/day: 0.50    Types: Cigarettes   Smokeless tobacco: Never   Tobacco comments:    Quit March 2020  Substance Use Topics   Alcohol use: Yes    Alcohol/week: 21.0 standard drinks of alcohol    Types: 21 Cans of beer per week    Comment: 2 beers per day    Allergies  Allergen Reactions   Bee Venom     Other reaction(s): Unknown   Iohexol Other (See Comments)     Code: RASH, Desc: rash 2 years ago, needs 13 hr prep for future exams    Lisinopril     Other reaction(s): Unknown   Metoclopramide Other (See Comments)    Other  reaction(s): shaking too bad "Sweat like crazy"    Penicillins Hives   Shellfish Allergy Swelling    Current Outpatient Medications  Medication Sig Dispense Refill   allopurinol (ZYLOPRIM) 300 MG tablet Take 300 mg by mouth daily.      CRESTOR 20 MG tablet Take 20 mg by mouth daily.      cyclobenzaprine (FLEXERIL) 10 MG tablet Take 10 mg by mouth 3 (three) times daily as needed for muscle spasms.     fluticasone (FLONASE) 50 MCG/ACT nasal spray INSTILL 2 SPRAYS IN EACH NOSTRIL DAILY     HYDROcodone-acetaminophen (NORCO/VICODIN) 5-325 MG tablet Take 1 tablet by mouth 2 (two) times daily as needed.     levothyroxine (SYNTHROID, LEVOTHROID) 50 MCG tablet Take 50 mcg by mouth daily.     omeprazole (PRILOSEC) 40 MG capsule Take 40 mg by mouth 2 (two) times daily.     No current facility-administered medications for this visit.    Review of Systems  Constitutional:  Constitutional negative. HENT: HENT negative.  Eyes: Eyes negative.  Respiratory: Positive for shortness of breath.  GI: Gastrointestinal negative.   Musculoskeletal: Positive for back pain, gait problem and leg pain.  Skin: Skin negative.  Hematologic: Hematologic/lymphatic negative.  Psychiatric: Psychiatric negative.        Objective:  Objective   Vitals:   02/14/22 0945  BP: 129/83  Pulse: 69  Resp: 20  Temp: 98.2 F (36.8 C)  SpO2: 94%  Weight: 235 lb (106.6 kg)  Height: '5\' 9"'$  (1.753 m)   Body mass index is 34.7 kg/m.  Physical Exam HENT:     Head: Normocephalic.     Nose: Nose normal.  Eyes:     Pupils: Pupils are equal, round, and reactive to light.  Cardiovascular:     Rate and Rhythm: Normal rate.     Pulses: Normal pulses.  Pulmonary:     Effort: Pulmonary effort is normal.  Abdominal:     General: Abdomen is flat.     Palpations: Abdomen is soft.  Musculoskeletal:     Cervical back: Normal range of motion and neck supple.     Right lower leg: No edema.     Left lower leg: No edema.  Skin:    General: Skin is warm.     Capillary Refill: Capillary refill takes less than 2 seconds.  Neurological:     General: No focal deficit present.     Mental Status: He is alert.  Psychiatric:        Mood and Affect: Mood normal.        Behavior: Behavior normal.        Thought Content: Thought content normal.        Judgment: Judgment normal.     Data: RIGHT    CompressibilityPhasicitySpontaneityProperties     Thrombus                                                                    Aging           +---------+---------------+---------+-----------+---------------+----------  ---+  CFV      Full           Yes      Yes                                        +---------+---------------+---------+-----------+---------------+----------  ---+  SFJ      Full                    Yes                                        +---------+---------------+---------+-----------+---------------+----------  ---+  FV Prox  Full           Yes      Yes                                         +---------+---------------+---------+-----------+---------------+----------  ---+  FV Mid   Full           Yes      Yes                                        +---------+---------------+---------+-----------+---------------+----------  ---+  FV DistalFull           Yes      Yes                                        +---------+---------------+---------+-----------+---------------+----------  ---+  POP      Partial        Yes      Yes        rigid                                                                       w/compression                   +---------+---------------+---------+-----------+---------------+----------  ---+  PTV      Full                    Yes                                        +---------+---------------+---------+-----------+---------------+----------  ---+  PERO     Full                    Yes                                        +---------+---------------+---------+-----------+---------------+----------  ---+  Gastroc  Full                                                               +---------+---------------+---------+-----------+---------------+----------  ---+  GSV      Full  Yes      Yes                                        +---------+---------------+---------+-----------+---------------+----------  ---+      +----+---------------+---------+-----------+----------+--------------+  LEFTCompressibilityPhasicitySpontaneityPropertiesThrombus Aging  +----+---------------+---------+-----------+----------+--------------+  CFV Full           Yes      Yes                                  +----+---------------+---------+-----------+----------+--------------+  SFJ Full                    Yes                                  +----+---------------+---------+-----------+----------+--------------+          Findings reported to Patient returning tomorrow  with MD visit.     Summary:  RIGHT:  - Continued thrombus in the popliteal vein with reconstitution. All other  veins appear patent.     LEFT:  - No evidence of common femoral vein obstruction.      Assessment/Plan:    66 year old male with a history of extensive DVT after spine surgery complicated by PE now has completed Eliquis.  He is now scheduled for a larger spine surgery.  No previous personal or family history of DVTs with some residual chronic thrombosis identified in his popliteal vein today.  I discussed with the patient that as long as he does not have any contraindication anticoagulation I would recommend bridging with Lovenox therapy or could be given heparin at the time of surgery when safe from a surgical standpoint and/or transition into a DOAC when safe.  I have also offered IVC filter placement which would not prevent any clots but would prevent travel to the lungs as he had PE in the past.  Patient is hopeful that his surgeon will clear him for perioperative anticoagulation which along with early ambulation will help prevent any further DVT or PE.  He does not need any further evaluation of current clot in the right lower extremity unless he has new symptoms and can see me on an as-needed basis.     Waynetta Sandy MD [facility]

## 2022-02-22 ENCOUNTER — Telehealth: Payer: Self-pay

## 2022-02-22 NOTE — Telephone Encounter (Signed)
[name] wants to know if he can reschedule his back surgery now.  He spoke with Dr. Danna Hefty office today and they instructed him to call us.  I left a message with Dr. Sherrell Puller office on voicemail at [phone] to see what they needed by way of clearance for the surgery to be rescheduled. Aimar Borghi [employer]

## 2022-02-26 ENCOUNTER — Telehealth: Payer: Self-pay

## 2022-02-26 NOTE — Telephone Encounter (Signed)
Pt called asking about back surgery.  Called pt and informed him that his clearance paperwork was faxed successfully to [place] in the surgery dept of [facility].

## 2022-03-07 ENCOUNTER — Telehealth: Payer: Self-pay

## 2022-03-07 NOTE — Telephone Encounter (Signed)
Received fax from Gabriela Eves at [facility] for anticoagulation management post back surgery.  Reviewed pt's chart, called Joy, and left a detailed msg stating that this office did not prescribe any Eliquis, the pt stated that he had not been taking it, and she needed to contact the prescriber, Dr. Kurtis Bushman.  Joy returned call, stated that Dr. Kurtis Bushman was the hospitalist who prescribed it and it not managing it. She reached out to his PCP, Dr. Dagmar Hait and was informed that they would not sign off on anticoagulation management.  Upon further review of pt's chart, this pt was referred to this office by Dr Dagmar Hait for an evaluation for anticoagulation clearance for spine surgery d/t hx of R LE DVT. An office visit and DVT study was scheduled. Dr. Donzetta Matters signed off that the pt was cleared for surgery, but would not manage anticoagulation after surgery. Called [employer] and lf vm with Margaretann Loveless.  Margaretann Loveless called and informed her of the situation and she would need to get the anticoagulation clarified between her office and [facility]. Confirmed understanding.

## 2022-03-14 HISTORY — PX: ILEOSTOMY: SHX1783

## 2022-03-16 DIAGNOSIS — M5431 Sciatica, right side: Secondary | ICD-10-CM | POA: Diagnosis not present

## 2022-03-16 DIAGNOSIS — M48062 Spinal stenosis, lumbar region with neurogenic claudication: Secondary | ICD-10-CM | POA: Diagnosis not present

## 2022-03-16 DIAGNOSIS — M5432 Sciatica, left side: Secondary | ICD-10-CM | POA: Diagnosis not present

## 2022-03-21 DIAGNOSIS — R051 Acute cough: Secondary | ICD-10-CM | POA: Diagnosis not present

## 2022-03-21 DIAGNOSIS — I110 Hypertensive heart disease with heart failure: Secondary | ICD-10-CM | POA: Diagnosis not present

## 2022-03-21 DIAGNOSIS — Z1152 Encounter for screening for COVID-19: Secondary | ICD-10-CM | POA: Diagnosis not present

## 2022-03-21 DIAGNOSIS — J069 Acute upper respiratory infection, unspecified: Secondary | ICD-10-CM | POA: Diagnosis not present

## 2022-03-21 DIAGNOSIS — R5383 Other fatigue: Secondary | ICD-10-CM | POA: Diagnosis not present

## 2022-03-26 DIAGNOSIS — I10 Essential (primary) hypertension: Secondary | ICD-10-CM | POA: Diagnosis not present

## 2022-03-26 DIAGNOSIS — M4726 Other spondylosis with radiculopathy, lumbar region: Secondary | ICD-10-CM | POA: Diagnosis not present

## 2022-03-26 DIAGNOSIS — J45909 Unspecified asthma, uncomplicated: Secondary | ICD-10-CM | POA: Diagnosis not present

## 2022-03-26 DIAGNOSIS — Z01812 Encounter for preprocedural laboratory examination: Secondary | ICD-10-CM | POA: Diagnosis not present

## 2022-03-26 DIAGNOSIS — E119 Type 2 diabetes mellitus without complications: Secondary | ICD-10-CM | POA: Diagnosis not present

## 2022-03-26 DIAGNOSIS — G4730 Sleep apnea, unspecified: Secondary | ICD-10-CM | POA: Diagnosis not present

## 2022-03-26 DIAGNOSIS — Z87891 Personal history of nicotine dependence: Secondary | ICD-10-CM | POA: Diagnosis not present

## 2022-03-26 DIAGNOSIS — Z7901 Long term (current) use of anticoagulants: Secondary | ICD-10-CM | POA: Diagnosis not present

## 2022-04-04 DIAGNOSIS — J449 Chronic obstructive pulmonary disease, unspecified: Secondary | ICD-10-CM | POA: Diagnosis not present

## 2022-04-04 DIAGNOSIS — M4306 Spondylolysis, lumbar region: Secondary | ICD-10-CM | POA: Diagnosis not present

## 2022-04-04 DIAGNOSIS — M4726 Other spondylosis with radiculopathy, lumbar region: Secondary | ICD-10-CM | POA: Diagnosis not present

## 2022-04-04 DIAGNOSIS — M1711 Unilateral primary osteoarthritis, right knee: Secondary | ICD-10-CM | POA: Diagnosis not present

## 2022-04-04 DIAGNOSIS — Z87891 Personal history of nicotine dependence: Secondary | ICD-10-CM | POA: Diagnosis not present

## 2022-04-04 DIAGNOSIS — M4326 Fusion of spine, lumbar region: Secondary | ICD-10-CM | POA: Diagnosis not present

## 2022-04-04 DIAGNOSIS — Z981 Arthrodesis status: Secondary | ICD-10-CM | POA: Diagnosis not present

## 2022-04-04 DIAGNOSIS — M5431 Sciatica, right side: Secondary | ICD-10-CM | POA: Diagnosis not present

## 2022-04-04 DIAGNOSIS — M48062 Spinal stenosis, lumbar region with neurogenic claudication: Secondary | ICD-10-CM | POA: Diagnosis not present

## 2022-04-04 DIAGNOSIS — R7303 Prediabetes: Secondary | ICD-10-CM | POA: Diagnosis not present

## 2022-04-04 DIAGNOSIS — M5416 Radiculopathy, lumbar region: Secondary | ICD-10-CM | POA: Diagnosis not present

## 2022-04-04 DIAGNOSIS — M5432 Sciatica, left side: Secondary | ICD-10-CM | POA: Diagnosis not present

## 2022-04-04 DIAGNOSIS — Z8546 Personal history of malignant neoplasm of prostate: Secondary | ICD-10-CM | POA: Diagnosis not present

## 2022-04-04 DIAGNOSIS — Z8739 Personal history of other diseases of the musculoskeletal system and connective tissue: Secondary | ICD-10-CM | POA: Diagnosis not present

## 2022-04-04 DIAGNOSIS — Z9889 Other specified postprocedural states: Secondary | ICD-10-CM | POA: Diagnosis not present

## 2022-04-04 DIAGNOSIS — R251 Tremor, unspecified: Secondary | ICD-10-CM | POA: Diagnosis not present

## 2022-04-05 DIAGNOSIS — M5432 Sciatica, left side: Secondary | ICD-10-CM | POA: Diagnosis not present

## 2022-04-05 DIAGNOSIS — M48062 Spinal stenosis, lumbar region with neurogenic claudication: Secondary | ICD-10-CM | POA: Diagnosis not present

## 2022-04-05 DIAGNOSIS — Z981 Arthrodesis status: Secondary | ICD-10-CM | POA: Diagnosis not present

## 2022-04-05 DIAGNOSIS — M4726 Other spondylosis with radiculopathy, lumbar region: Secondary | ICD-10-CM | POA: Diagnosis not present

## 2022-04-05 DIAGNOSIS — J449 Chronic obstructive pulmonary disease, unspecified: Secondary | ICD-10-CM | POA: Diagnosis not present

## 2022-04-05 DIAGNOSIS — R7303 Prediabetes: Secondary | ICD-10-CM | POA: Diagnosis not present

## 2022-04-05 DIAGNOSIS — M5431 Sciatica, right side: Secondary | ICD-10-CM | POA: Diagnosis not present

## 2022-04-07 DIAGNOSIS — I10 Essential (primary) hypertension: Secondary | ICD-10-CM | POA: Diagnosis not present

## 2022-04-07 DIAGNOSIS — M4726 Other spondylosis with radiculopathy, lumbar region: Secondary | ICD-10-CM | POA: Diagnosis not present

## 2022-04-07 DIAGNOSIS — Z4789 Encounter for other orthopedic aftercare: Secondary | ICD-10-CM | POA: Diagnosis not present

## 2022-04-07 DIAGNOSIS — E119 Type 2 diabetes mellitus without complications: Secondary | ICD-10-CM | POA: Diagnosis not present

## 2022-04-07 DIAGNOSIS — J449 Chronic obstructive pulmonary disease, unspecified: Secondary | ICD-10-CM | POA: Diagnosis not present

## 2022-04-07 DIAGNOSIS — K219 Gastro-esophageal reflux disease without esophagitis: Secondary | ICD-10-CM | POA: Diagnosis not present

## 2022-04-07 DIAGNOSIS — J45909 Unspecified asthma, uncomplicated: Secondary | ICD-10-CM | POA: Diagnosis not present

## 2022-04-07 DIAGNOSIS — G8929 Other chronic pain: Secondary | ICD-10-CM | POA: Diagnosis not present

## 2022-04-07 DIAGNOSIS — E559 Vitamin D deficiency, unspecified: Secondary | ICD-10-CM | POA: Diagnosis not present

## 2022-04-07 DIAGNOSIS — Z86718 Personal history of other venous thrombosis and embolism: Secondary | ICD-10-CM | POA: Diagnosis not present

## 2022-04-07 DIAGNOSIS — R251 Tremor, unspecified: Secondary | ICD-10-CM | POA: Diagnosis not present

## 2022-04-07 DIAGNOSIS — E039 Hypothyroidism, unspecified: Secondary | ICD-10-CM | POA: Diagnosis not present

## 2022-04-07 DIAGNOSIS — M5432 Sciatica, left side: Secondary | ICD-10-CM | POA: Diagnosis not present

## 2022-04-07 DIAGNOSIS — K589 Irritable bowel syndrome without diarrhea: Secondary | ICD-10-CM | POA: Diagnosis not present

## 2022-04-07 DIAGNOSIS — M48062 Spinal stenosis, lumbar region with neurogenic claudication: Secondary | ICD-10-CM | POA: Diagnosis not present

## 2022-04-07 DIAGNOSIS — M109 Gout, unspecified: Secondary | ICD-10-CM | POA: Diagnosis not present

## 2022-04-07 DIAGNOSIS — Z981 Arthrodesis status: Secondary | ICD-10-CM | POA: Diagnosis not present

## 2022-04-07 DIAGNOSIS — M5431 Sciatica, right side: Secondary | ICD-10-CM | POA: Diagnosis not present

## 2022-04-07 DIAGNOSIS — Z8546 Personal history of malignant neoplasm of prostate: Secondary | ICD-10-CM | POA: Diagnosis not present

## 2022-04-07 DIAGNOSIS — E7800 Pure hypercholesterolemia, unspecified: Secondary | ICD-10-CM | POA: Diagnosis not present

## 2022-04-07 DIAGNOSIS — M4722 Other spondylosis with radiculopathy, cervical region: Secondary | ICD-10-CM | POA: Diagnosis not present

## 2022-04-08 ENCOUNTER — Emergency Department (HOSPITAL_BASED_OUTPATIENT_CLINIC_OR_DEPARTMENT_OTHER): Payer: Medicare Other

## 2022-04-08 ENCOUNTER — Encounter (HOSPITAL_BASED_OUTPATIENT_CLINIC_OR_DEPARTMENT_OTHER): Payer: Self-pay | Admitting: Emergency Medicine

## 2022-04-08 ENCOUNTER — Other Ambulatory Visit: Payer: Self-pay

## 2022-04-08 ENCOUNTER — Encounter (HOSPITAL_COMMUNITY): Payer: Self-pay

## 2022-04-08 ENCOUNTER — Inpatient Hospital Stay (HOSPITAL_BASED_OUTPATIENT_CLINIC_OR_DEPARTMENT_OTHER)
Admission: EM | Admit: 2022-04-08 | Discharge: 2022-04-22 | DRG: 981 | Disposition: A | Discharge status: Home Health | Payer: Medicare Other | Attending: Internal Medicine | Admitting: Internal Medicine

## 2022-04-08 DIAGNOSIS — J449 Chronic obstructive pulmonary disease, unspecified: Secondary | ICD-10-CM | POA: Diagnosis not present

## 2022-04-08 DIAGNOSIS — G4733 Obstructive sleep apnea (adult) (pediatric): Secondary | ICD-10-CM | POA: Diagnosis present

## 2022-04-08 DIAGNOSIS — E039 Hypothyroidism, unspecified: Secondary | ICD-10-CM | POA: Diagnosis not present

## 2022-04-08 DIAGNOSIS — M4326 Fusion of spine, lumbar region: Secondary | ICD-10-CM | POA: Diagnosis not present

## 2022-04-08 DIAGNOSIS — E876 Hypokalemia: Secondary | ICD-10-CM | POA: Diagnosis present

## 2022-04-08 DIAGNOSIS — Z8546 Personal history of malignant neoplasm of prostate: Secondary | ICD-10-CM

## 2022-04-08 DIAGNOSIS — Z91013 Allergy to seafood: Secondary | ICD-10-CM

## 2022-04-08 DIAGNOSIS — Z87891 Personal history of nicotine dependence: Secondary | ICD-10-CM | POA: Diagnosis not present

## 2022-04-08 DIAGNOSIS — N200 Calculus of kidney: Secondary | ICD-10-CM | POA: Diagnosis not present

## 2022-04-08 DIAGNOSIS — E873 Alkalosis: Secondary | ICD-10-CM | POA: Diagnosis not present

## 2022-04-08 DIAGNOSIS — K219 Gastro-esophageal reflux disease without esophagitis: Secondary | ICD-10-CM | POA: Diagnosis present

## 2022-04-08 DIAGNOSIS — J9601 Acute respiratory failure with hypoxia: Secondary | ICD-10-CM | POA: Diagnosis present

## 2022-04-08 DIAGNOSIS — R578 Other shock: Secondary | ICD-10-CM | POA: Diagnosis present

## 2022-04-08 DIAGNOSIS — Z96643 Presence of artificial hip joint, bilateral: Secondary | ICD-10-CM | POA: Diagnosis present

## 2022-04-08 DIAGNOSIS — E1165 Type 2 diabetes mellitus with hyperglycemia: Secondary | ICD-10-CM | POA: Diagnosis not present

## 2022-04-08 DIAGNOSIS — I82501 Chronic embolism and thrombosis of unspecified deep veins of right lower extremity: Secondary | ICD-10-CM | POA: Diagnosis present

## 2022-04-08 DIAGNOSIS — I2699 Other pulmonary embolism without acute cor pulmonale: Principal | ICD-10-CM | POA: Diagnosis present

## 2022-04-08 DIAGNOSIS — T402X5A Adverse effect of other opioids, initial encounter: Secondary | ICD-10-CM | POA: Diagnosis present

## 2022-04-08 DIAGNOSIS — I82411 Acute embolism and thrombosis of right femoral vein: Secondary | ICD-10-CM | POA: Diagnosis present

## 2022-04-08 DIAGNOSIS — R140 Abdominal distension (gaseous): Secondary | ICD-10-CM | POA: Diagnosis not present

## 2022-04-08 DIAGNOSIS — K560 Paralytic ileus: Secondary | ICD-10-CM | POA: Diagnosis not present

## 2022-04-08 DIAGNOSIS — D649 Anemia, unspecified: Secondary | ICD-10-CM | POA: Insufficient documentation

## 2022-04-08 DIAGNOSIS — M48062 Spinal stenosis, lumbar region with neurogenic claudication: Secondary | ICD-10-CM | POA: Diagnosis not present

## 2022-04-08 DIAGNOSIS — K661 Hemoperitoneum: Secondary | ICD-10-CM | POA: Diagnosis not present

## 2022-04-08 DIAGNOSIS — Y838 Other surgical procedures as the cause of abnormal reaction of the patient, or of later complication, without mention of misadventure at the time of the procedure: Secondary | ICD-10-CM | POA: Diagnosis present

## 2022-04-08 DIAGNOSIS — K5939 Other megacolon: Secondary | ICD-10-CM | POA: Diagnosis not present

## 2022-04-08 DIAGNOSIS — I4891 Unspecified atrial fibrillation: Secondary | ICD-10-CM | POA: Diagnosis not present

## 2022-04-08 DIAGNOSIS — E669 Obesity, unspecified: Secondary | ICD-10-CM | POA: Diagnosis not present

## 2022-04-08 DIAGNOSIS — Z79899 Other long term (current) drug therapy: Secondary | ICD-10-CM

## 2022-04-08 DIAGNOSIS — S3692XA Contusion of unspecified intra-abdominal organ, initial encounter: Secondary | ICD-10-CM | POA: Diagnosis present

## 2022-04-08 DIAGNOSIS — Z6837 Body mass index (BMI) 37.0-37.9, adult: Secondary | ICD-10-CM

## 2022-04-08 DIAGNOSIS — I4729 Other ventricular tachycardia: Secondary | ICD-10-CM | POA: Diagnosis not present

## 2022-04-08 DIAGNOSIS — M4726 Other spondylosis with radiculopathy, lumbar region: Secondary | ICD-10-CM | POA: Diagnosis not present

## 2022-04-08 DIAGNOSIS — F419 Anxiety disorder, unspecified: Secondary | ICD-10-CM | POA: Diagnosis present

## 2022-04-08 DIAGNOSIS — I82431 Acute embolism and thrombosis of right popliteal vein: Secondary | ICD-10-CM | POA: Diagnosis present

## 2022-04-08 DIAGNOSIS — Z86010 Personal history of colonic polyps: Secondary | ICD-10-CM

## 2022-04-08 DIAGNOSIS — R579 Shock, unspecified: Secondary | ICD-10-CM

## 2022-04-08 DIAGNOSIS — R609 Edema, unspecified: Secondary | ICD-10-CM | POA: Diagnosis not present

## 2022-04-08 DIAGNOSIS — I2602 Saddle embolus of pulmonary artery with acute cor pulmonale: Secondary | ICD-10-CM | POA: Diagnosis not present

## 2022-04-08 DIAGNOSIS — I82401 Acute embolism and thrombosis of unspecified deep veins of right lower extremity: Secondary | ICD-10-CM | POA: Diagnosis not present

## 2022-04-08 DIAGNOSIS — M5431 Sciatica, right side: Secondary | ICD-10-CM | POA: Diagnosis not present

## 2022-04-08 DIAGNOSIS — N390 Urinary tract infection, site not specified: Secondary | ICD-10-CM | POA: Diagnosis not present

## 2022-04-08 DIAGNOSIS — Z7901 Long term (current) use of anticoagulants: Secondary | ICD-10-CM

## 2022-04-08 DIAGNOSIS — K55039 Acute (reversible) ischemia of large intestine, extent unspecified: Secondary | ICD-10-CM | POA: Diagnosis not present

## 2022-04-08 DIAGNOSIS — I10 Essential (primary) hypertension: Secondary | ICD-10-CM | POA: Diagnosis present

## 2022-04-08 DIAGNOSIS — I2609 Other pulmonary embolism with acute cor pulmonale: Secondary | ICD-10-CM

## 2022-04-08 DIAGNOSIS — K567 Ileus, unspecified: Secondary | ICD-10-CM | POA: Diagnosis not present

## 2022-04-08 DIAGNOSIS — M109 Gout, unspecified: Secondary | ICD-10-CM | POA: Diagnosis present

## 2022-04-08 DIAGNOSIS — Z4789 Encounter for other orthopedic aftercare: Secondary | ICD-10-CM | POA: Diagnosis not present

## 2022-04-08 DIAGNOSIS — I2510 Atherosclerotic heart disease of native coronary artery without angina pectoris: Secondary | ICD-10-CM | POA: Diagnosis present

## 2022-04-08 DIAGNOSIS — D638 Anemia in other chronic diseases classified elsewhere: Secondary | ICD-10-CM | POA: Diagnosis not present

## 2022-04-08 DIAGNOSIS — R509 Fever, unspecified: Secondary | ICD-10-CM | POA: Diagnosis not present

## 2022-04-08 DIAGNOSIS — K6389 Other specified diseases of intestine: Secondary | ICD-10-CM | POA: Diagnosis not present

## 2022-04-08 DIAGNOSIS — I700 Atherosclerosis of aorta: Secondary | ICD-10-CM | POA: Diagnosis not present

## 2022-04-08 DIAGNOSIS — Z888 Allergy status to other drugs, medicaments and biological substances status: Secondary | ICD-10-CM

## 2022-04-08 DIAGNOSIS — R109 Unspecified abdominal pain: Secondary | ICD-10-CM | POA: Diagnosis not present

## 2022-04-08 DIAGNOSIS — K9189 Other postprocedural complications and disorders of digestive system: Secondary | ICD-10-CM | POA: Diagnosis present

## 2022-04-08 DIAGNOSIS — Z20822 Contact with and (suspected) exposure to covid-19: Secondary | ICD-10-CM | POA: Diagnosis present

## 2022-04-08 DIAGNOSIS — K5903 Drug induced constipation: Secondary | ICD-10-CM | POA: Diagnosis not present

## 2022-04-08 DIAGNOSIS — N179 Acute kidney failure, unspecified: Secondary | ICD-10-CM | POA: Diagnosis present

## 2022-04-08 DIAGNOSIS — K5981 Ogilvie syndrome: Secondary | ICD-10-CM | POA: Insufficient documentation

## 2022-04-08 DIAGNOSIS — E871 Hypo-osmolality and hyponatremia: Secondary | ICD-10-CM | POA: Diagnosis present

## 2022-04-08 DIAGNOSIS — R0902 Hypoxemia: Secondary | ICD-10-CM | POA: Diagnosis not present

## 2022-04-08 DIAGNOSIS — D62 Acute posthemorrhagic anemia: Secondary | ICD-10-CM | POA: Diagnosis present

## 2022-04-08 DIAGNOSIS — Z79890 Hormone replacement therapy (postmenopausal): Secondary | ICD-10-CM

## 2022-04-08 DIAGNOSIS — J9811 Atelectasis: Secondary | ICD-10-CM | POA: Diagnosis not present

## 2022-04-08 DIAGNOSIS — Z981 Arthrodesis status: Secondary | ICD-10-CM

## 2022-04-08 DIAGNOSIS — E785 Hyperlipidemia, unspecified: Secondary | ICD-10-CM | POA: Diagnosis present

## 2022-04-08 DIAGNOSIS — Z880 Allergy status to penicillin: Secondary | ICD-10-CM

## 2022-04-08 DIAGNOSIS — Z8249 Family history of ischemic heart disease and other diseases of the circulatory system: Secondary | ICD-10-CM

## 2022-04-08 DIAGNOSIS — Z91030 Bee allergy status: Secondary | ICD-10-CM

## 2022-04-08 DIAGNOSIS — R198 Other specified symptoms and signs involving the digestive system and abdomen: Secondary | ICD-10-CM

## 2022-04-08 LAB — BASIC METABOLIC PANEL
Anion gap: 11 (ref 5–15)
BUN: 18 mg/dL (ref 8–23)
CO2: 27 mmol/L (ref 22–32)
Calcium: 9.4 mg/dL (ref 8.9–10.3)
Chloride: 95 mmol/L — ABNORMAL LOW (ref 98–111)
Creatinine, Ser: 1.41 mg/dL — ABNORMAL HIGH (ref 0.61–1.24)
GFR, Estimated: 56 mL/min — ABNORMAL LOW (ref >60)
Glucose, Bld: 178 mg/dL — ABNORMAL HIGH (ref 70–99)
Potassium: 3.7 mmol/L (ref 3.5–5.1)
Sodium: 133 mmol/L — ABNORMAL LOW (ref 135–145)

## 2022-04-08 LAB — TROPONIN I (HIGH SENSITIVITY)
Troponin I (High Sensitivity): 8 ng/L (ref <18)
Troponin I (High Sensitivity): 11 ng/L (ref <18)

## 2022-04-08 LAB — CBC WITH DIFFERENTIAL/PLATELET
Abs Immature Granulocytes: 0.05 10*3/uL (ref 0.00–0.07)
Basophils Absolute: 0 10*3/uL (ref 0.0–0.1)
Basophils Relative: 0 %
Eosinophils Absolute: 0.1 10*3/uL (ref 0.0–0.5)
Eosinophils Relative: 1 %
HCT: 31.5 % — ABNORMAL LOW (ref 39.0–52.0)
Hemoglobin: 10.4 g/dL — ABNORMAL LOW (ref 13.0–17.0)
Immature Granulocytes: 0 %
Lymphocytes Relative: 14 %
Lymphs Abs: 1.7 10*3/uL (ref 0.7–4.0)
MCH: 30.9 pg (ref 26.0–34.0)
MCHC: 33 g/dL (ref 30.0–36.0)
MCV: 93.5 fL (ref 80.0–100.0)
Monocytes Absolute: 1.3 10*3/uL — ABNORMAL HIGH (ref 0.1–1.0)
Monocytes Relative: 11 %
Neutro Abs: 8.9 10*3/uL — ABNORMAL HIGH (ref 1.7–7.7)
Neutrophils Relative %: 74 %
Platelets: 184 10*3/uL (ref 150–400)
RBC: 3.37 MIL/uL — ABNORMAL LOW (ref 4.22–5.81)
RDW: 13.5 % (ref 11.5–15.5)
WBC: 12.1 10*3/uL — ABNORMAL HIGH (ref 4.0–10.5)
nRBC: 0 % (ref 0.0–0.2)

## 2022-04-08 LAB — BRAIN NATRIURETIC PEPTIDE: B Natriuretic Peptide: 40.5 pg/mL (ref 0.0–100.0)

## 2022-04-08 LAB — LACTIC ACID, PLASMA
Lactic Acid, Venous: 2.1 mmol/L (ref 0.5–1.9)
Lactic Acid, Venous: 2.3 mmol/L (ref 0.5–1.9)

## 2022-04-08 LAB — HEPARIN LEVEL (UNFRACTIONATED)
Heparin Unfractionated: >1.1 IU/mL — ABNORMAL HIGH (ref 0.30–0.70)
Heparin Unfractionated: 0.57 IU/mL (ref 0.30–0.70)

## 2022-04-08 LAB — PROTIME-INR
INR: 1.1 (ref 0.8–1.2)
Prothrombin Time: 13.9 seconds (ref 11.4–15.2)

## 2022-04-08 LAB — LIPASE, BLOOD: Lipase: 42 U/L (ref 11–51)

## 2022-04-08 LAB — APTT: aPTT: 29 seconds (ref 24–36)

## 2022-04-08 LAB — MRSA NEXT GEN BY PCR, NASAL: MRSA by PCR Next Gen: NOT DETECTED

## 2022-04-08 LAB — RESP PANEL BY RT-PCR (FLU A&B, COVID) ARPGX2
Influenza A by PCR: NEGATIVE
Influenza B by PCR: NEGATIVE
SARS Coronavirus 2 by RT PCR: NEGATIVE

## 2022-04-08 MED ORDER — ORAL CARE MOUTH RINSE: 15.0000 mL | OROMUCOSAL | Status: DC | PRN

## 2022-04-08 MED ORDER — HEPARIN BOLUS VIA INFUSION
5600.0000 [IU] | Freq: Once | INTRAVENOUS | Status: AC
  Administered 2022-04-08: 5600 [IU] via INTRAVENOUS

## 2022-04-08 MED ORDER — LACTATED RINGERS IV BOLUS
1000.0000 mL | Freq: Once | INTRAVENOUS | Status: AC
  Administered 2022-04-08: 1000 mL via INTRAVENOUS

## 2022-04-08 MED ORDER — NALOXEGOL OXALATE 25 MG PO TABS
25.0000 mg | ORAL_TABLET | Freq: Every day | ORAL | Status: AC
  Administered 2022-04-08 – 2022-04-10 (×3): 25 mg via ORAL
  Filled 2022-04-08 (×3): qty 1

## 2022-04-08 MED ORDER — ONDANSETRON HCL 4 MG/2ML IJ SOLN
4.0000 mg | Freq: Once | INTRAMUSCULAR | Status: AC
  Administered 2022-04-08: 4 mg via INTRAVENOUS
  Filled 2022-04-08: qty 2

## 2022-04-08 MED ORDER — FENTANYL CITRATE PF 50 MCG/ML IJ SOSY
50.0000 ug | PREFILLED_SYRINGE | Freq: Once | INTRAMUSCULAR | Status: AC
  Administered 2022-04-08: 50 ug via INTRAVENOUS
  Filled 2022-04-08: qty 1

## 2022-04-08 MED ORDER — CHLORHEXIDINE GLUCONATE CLOTH 2 % EX PADS
6.0000 | MEDICATED_PAD | Freq: Every day | CUTANEOUS | Status: DC
  Administered 2022-04-08 – 2022-04-17 (×10): 6 via TOPICAL

## 2022-04-08 MED ORDER — POLYETHYLENE GLYCOL 3350 17 G PO PACK: 17.0000 g | PACK | Freq: Two times a day (BID) | ORAL | Status: DC

## 2022-04-08 MED ORDER — OXYCODONE HCL 5 MG PO TABS
10.0000 mg | ORAL_TABLET | Freq: Once | ORAL | Status: AC
  Administered 2022-04-08: 10 mg via ORAL
  Filled 2022-04-08: qty 2

## 2022-04-08 MED ORDER — HEPARIN (PORCINE) 25000 UT/250ML-% IV SOLN
1400.0000 [IU]/h | INTRAVENOUS | Status: DC
  Administered 2022-04-08 – 2022-04-09 (×4): 1500 [IU]/h via INTRAVENOUS
  Administered 2022-04-10: 1400 [IU]/h via INTRAVENOUS
  Filled 2022-04-08 (×3): qty 250

## 2022-04-08 MED ORDER — LACTATED RINGERS IV SOLN: INTRAVENOUS | Status: DC

## 2022-04-08 MED ORDER — IOHEXOL 300 MG/ML  SOLN
100.0000 mL | Freq: Once | INTRAMUSCULAR | Status: AC | PRN
  Administered 2022-04-08: 100 mL via INTRAVENOUS

## 2022-04-08 MED ORDER — BISACODYL 5 MG PO TBEC
20.0000 mg | DELAYED_RELEASE_TABLET | Freq: Once | ORAL | Status: AC
  Administered 2022-04-08: 20 mg via ORAL
  Filled 2022-04-08: qty 4

## 2022-04-08 NOTE — Assessment & Plan Note (Signed)
Chronic. 

## 2022-04-08 NOTE — Assessment & Plan Note (Addendum)
Patient started on Movantik this admission. -Continue Movantik -SMOG enema

## 2022-04-08 NOTE — ED Notes (Signed)
Patient placed on O2 at 2L due to low O2 sats.

## 2022-04-08 NOTE — Progress Notes (Signed)
ANTICOAGULATION CONSULT NOTE - Follow Up Consult  Pharmacy Consult for Heparin Indication: pulmonary embolus and DVT  Allergies  Allergen Reactions   Bee Venom     Other reaction(s): Unknown   Lisinopril     Other reaction(s): Unknown   Metoclopramide Other (See Comments)    Other reaction(s): shaking too bad "Sweat like crazy"    Penicillins Hives   Shellfish Allergy Swelling    Patient Measurements: Height: 5' 9.02" (175.3 cm) Weight: 106.6 kg (235 lb) IBW/kg (Calculated) : 70.74 Heparin Dosing Weight: 94 kg  Vital Signs: Temp: 98.3 F (36.8 C) (11/26 1717) Temp Source: Oral (11/26 1717) BP: 126/64 (11/26 1823) Pulse Rate: 115 (11/26 1823)  Labs: Recent Labs    04/08/22 0818 04/08/22 1021 04/08/22 1132 04/08/22 1314  HGB 10.4*  --   --   --   HCT 31.5*  --   --   --   PLT 184  --   --   --   APTT  --  29  --   --   LABPROT  --  13.9  --   --   INR  --  1.1  --   --   HEPARINUNFRC  --   --  >1.10*  --   CREATININE 1.41*  --   --   --   TROPONINIHS  --  8  --  11    Estimated Creatinine Clearance: 63.7 mL/min (A) (by C-G formula based on SCr of 1.41 mg/dL (H)).   Medications:  Infusions:   heparin 1,500 Units/hr (04/08/22 1827)   lactated ringers 125 mL/hr at 04/08/22 1828    Assessment: 75 yoM presented on 11/26 for cough, SOB, abdominal pain.  PMH significant for DVT/PE after previous cervical spine surgery 07/17/21 and he completed a course of apixaban in August.  He had a recent L2-L5 fusion on 04/04/22 and had a postop plan to be on Lovenox '40mg'$  (filled 04/04/22 x7 days).  Today, CTa is positive for acute PE with right heart strain and postop ileus.  Dopplers are positive for DVT in RLE.   Heparin level 0.57, therapeutic on heparin at 1500 units/hr  No bleeding or complications reported.   Goal of Therapy:  Heparin level 0.3-0.7 units/ml Monitor platelets by anticoagulation protocol: Yes   Plan:  Continue heparin IV infusion at 1500  units/hr Confirmatory Heparin level in 6 hours Daily heparin level and CBC   Gretta Arab PharmD, BCPS [facility] main pharmacy [phone] 04/08/2022 7:24 PM

## 2022-04-08 NOTE — Assessment & Plan Note (Addendum)
Secondary to acute PE. Patient requiring 2 L/min of supplemental oxygen. -Wean to room air as able -Ambulatory pulse ox prior to discharge

## 2022-04-08 NOTE — Assessment & Plan Note (Addendum)
Not on antihypertensive drug regimen. Stable.

## 2022-04-08 NOTE — Assessment & Plan Note (Signed)
Stable.  Continue Synthroid 50 mcg daily.

## 2022-04-08 NOTE — ED Notes (Signed)
Dr Armandina Gemma aware of lactic acid of 2.1

## 2022-04-08 NOTE — Assessment & Plan Note (Addendum)
Radiology report suggests right LE DVT is chronic. Discussed with Dr. Carlis Abbott, vascular surgery, who agrees that DVT is likely chronic; no intervention.

## 2022-04-08 NOTE — Progress Notes (Signed)
ANTICOAGULATION CONSULT NOTE - Initial Consult  Pharmacy Consult for heparin gtt Indication: pulmonary embolus  Allergies  Allergen Reactions   Bee Venom     Other reaction(s): Unknown   Lisinopril     Other reaction(s): Unknown   Metoclopramide Other (See Comments)    Other reaction(s): shaking too bad "Sweat like crazy"    Penicillins Hives   Shellfish Allergy Swelling    Patient Measurements: Height: 5' 9.02" (175.3 cm) Weight: 106.6 kg (235 lb) IBW/kg (Calculated) : 70.74 Heparin Dosing Weight: 93.9 kg   Vital Signs: Temp: 99.1 F (37.3 C) (11/26 0748) Temp Source: Oral (11/26 0748) BP: 115/78 (11/26 0900) Pulse Rate: 113 (11/26 0900)  Labs: Recent Labs    04/08/22 0818  HGB 10.4*  HCT 31.5*  PLT 184  CREATININE 1.41*    Estimated Creatinine Clearance: 63.7 mL/min (A) (by C-G formula based on SCr of 1.41 mg/dL (H)).   Medical History: Past Medical History:  Diagnosis Date   Allergy    Arthritis    osteo   CTS (carpal tunnel syndrome)    DJD (degenerative joint disease)    DM2 (diabetes mellitus, type 2) (HCC)    DVT (deep venous thrombosis) (HCC)    GERD (gastroesophageal reflux disease)    Gout    History of adenomatous polyps of colon 12/29/2019   History of nuclear stress test    Myoview 11/17: EF 54, no ST changes, hypertensive blood pressure response, no ischemia, low risk   Hyperlipidemia    Hypothyroidism    Nicotine addiction    OSA (obstructive sleep apnea)    Prostatitis    Reactive airway disease    Sarcocystosis    Sinus arrhythmia    Sleep apnea    cpap- not wearing   Tinea corporis     Assessment: 66 yo M previously on Eliquis '5mg'$  BID for DVT/PE after spinal surgery - completed 6 months therapy. Per patient he is no longer taking (LF 10/13/21 #180). Found to have PE on CT Angio - pharmacy consulted to dose heparin infusion  CBC Hgb 10.4/Plt 184  Goal of Therapy:  Heparin level 0.3-0.7 units/ml Monitor platelets by  anticoagulation protocol: Yes   Plan:  Heparin bolus 5600 units IV x1 followed by heparin infusion at 1500 units/hr 8hr HL at 1600 Daily HL, CBC F/u s/sx bleeding and transition to enteral options   Wilson Singer, PharmD Clinical Pharmacist 04/08/2022 10:17 AM

## 2022-04-08 NOTE — ED Provider Notes (Signed)
[facility] EMERGENCY DEPT Provider Note   CSN: [id number] Arrival date & time: 04/08/22  5537     History  Chief Complaint  Patient presents with   Constipation    Kyle Delacruz is a 66 y.o. male.   Constipation Associated symptoms: abdominal pain and nausea   Associated symptoms: no fever      66 year old male with medical history significant for GERD, HLD, gout, hypothyroidism, DM 2, DJD, OSA, DVT not currently on anticoagulation who presents to the emergency department with abdominal distention, abdominal pain, cough, shortness of breath.  The patient states that he is almost 1 week out postoperatively from L2-L5 decompression and fusion with TLIF instrumentation and bone grafting with orthopedics at [facility] in [facility].  The patient states that he has not had a significant bowel movement since discharge from the hospital.  He endorses generalized abdominal pain and distention.  He denies any vomiting.  He endorses mild nausea.  Additionally, his anticoagulation has been on hold for surgery and he was scheduled to restart Lovenox this week.  He has a history of DVT in the right lower extremity has chronic swelling in that extremity.  He endorses a new cough and shortness of breath.  Denies any fevers or chills.  He is worried about constipation as he has had worsening abdominal distention and pain and has had no success with home MiraLAX, senna, prune juice and 2 at home enemas.  He denies any new numbness or weakness in the lower extremities, denies any urinary or fecal incontinence, denies any saddle anesthesia, denies any recent falls.   Home Medications Prior to Admission medications   Medication Sig Start Date End Date Taking? Authorizing Provider  allopurinol (ZYLOPRIM) 300 MG tablet Take 300 mg by mouth daily.  06/30/11   [provider]  CRESTOR 20 MG tablet Take 20 mg by mouth daily.  06/30/11   [provider]  cyclobenzaprine  (FLEXERIL) 10 MG tablet Take 10 mg by mouth 3 (three) times daily as needed for muscle spasms. 07/18/21   [provider]  fluticasone (FLONASE) 50 MCG/ACT nasal spray INSTILL 2 SPRAYS IN EACH NOSTRIL DAILY 12/13/21   [provider]  HYDROcodone-acetaminophen (NORCO/VICODIN) 5-325 MG tablet Take 1 tablet by mouth 2 (two) times daily as needed. 02/05/22   [provider]  levothyroxine (SYNTHROID, LEVOTHROID) 50 MCG tablet Take 50 mcg by mouth daily. 02/18/15   [provider]  omeprazole (PRILOSEC) 40 MG capsule Take 40 mg by mouth 2 (two) times daily. 01/31/21   [provider]      Allergies    Bee venom, Lisinopril, Metoclopramide, Penicillins, and Shellfish allergy    Review of Systems   Review of Systems  Constitutional:  Negative for chills and fever.  Respiratory:  Positive for cough and shortness of breath.   Cardiovascular:  Negative for chest pain.  Gastrointestinal:  Positive for abdominal pain, constipation and nausea.  All other systems reviewed and are negative.   Physical Exam Updated Vital Signs BP 122/82   Pulse (!) 127   Temp 98.2 F (36.8 C) (Oral)   Resp 12   Ht 5' 9.02" (1.753 m)   Wt 106.6 kg   SpO2 98%   BMI 34.69 kg/m  Physical Exam Vitals and nursing note reviewed.  Constitutional:      General: He is not in acute distress.    Appearance: He is well-developed.  HENT:     Head: Normocephalic and atraumatic.  Eyes:     Conjunctiva/sclera: Conjunctivae normal.  Cardiovascular:     Rate and Rhythm: Regular rhythm. Tachycardia present.  Pulmonary:     Effort: Pulmonary effort is normal. No respiratory distress.     Breath sounds: Normal breath sounds.  Abdominal:     General: There is distension.     Palpations: Abdomen is soft.     Tenderness: There is abdominal tenderness.     Comments: Abdominal distention with mild generalized abdominal tenderness to palpation, no rebound or guarding  Genitourinary:     Comments: Rectal exam without fecal impaction Musculoskeletal:        General: No swelling.     Cervical back: Neck supple.     Right lower leg: Edema present.     Left lower leg: No edema.     Comments: Compression sock in place in the right lower extremity which appears slightly swollen compared to the left, no clear pitting edema.  Spine incision with dressing in place, appears c/d/i  Skin:    General: Skin is warm and dry.     Capillary Refill: Capillary refill takes less than 2 seconds.  Neurological:     Mental Status: He is alert.     Comments: MENTAL STATUS EXAM:    Orientation: Alert and oriented to person, place and time.  Memory: Cooperative, follows commands well.  Language: Speech is clear and language is normal.   CRANIAL NERVES:    CN 2 (Optic): Visual fields intact to confrontation.  CN 3,4,6 (EOM): Pupils equal and reactive to light. Full extraocular eye movement without nystagmus.  CN 5 (Trigeminal): Facial sensation is normal, no weakness of masticatory muscles.  CN 7 (Facial): No facial weakness or asymmetry.  CN 8 (Auditory): Auditory acuity grossly normal.  CN 9,10 (Glossophar): The uvula is midline, the palate elevates symmetrically.  CN 11 (spinal access): Normal sternocleidomastoid and trapezius strength.  CN 12 (Hypoglossal): The tongue is midline. No atrophy or fasciculations.Marland Kitchen   MOTOR:  Muscle Strength: 5/5RUE, 5/5LUE, 5/5RLE, 5/5LLE.   COORDINATION:  No tremor.   SENSATION:   Intact to light touch all four extremities.    Psychiatric:        Mood and Affect: Mood normal.     ED Results / Procedures / Treatments   Labs (all labs ordered are listed, but only abnormal results are displayed) Labs Reviewed  CBC WITH DIFFERENTIAL/PLATELET - Abnormal; Notable for the following components:      Result Value   WBC 12.1 (*)    RBC 3.37 (*)    Hemoglobin 10.4 (*)    HCT 31.5 (*)    Neutro Abs 8.9 (*)    Monocytes Absolute 1.3 (*)    All other  components within normal limits  BASIC METABOLIC PANEL - Abnormal; Notable for the following components:   Sodium 133 (*)    Chloride 95 (*)    Glucose, Bld 178 (*)    Creatinine, Ser 1.41 (*)    GFR, Estimated 56 (*)    All other components within normal limits  LACTIC ACID, PLASMA - Abnormal; Notable for the following components:   Lactic Acid, Venous 2.1 (*)    All other components within normal limits  LACTIC ACID, PLASMA - Abnormal; Notable for the following components:   Lactic Acid, Venous 2.3 (*)    All other components within normal limits  HEPARIN LEVEL (UNFRACTIONATED) - Abnormal; Notable for the following components:   Heparin Unfractionated >1.10 (*)    All  other components within normal limits  RESP PANEL BY RT-PCR (FLU A&B, COVID) ARPGX2  LIPASE, BLOOD  BRAIN NATRIURETIC PEPTIDE  PROTIME-INR  APTT  URINALYSIS, ROUTINE W REFLEX MICROSCOPIC  HEPARIN LEVEL (UNFRACTIONATED)  TROPONIN I (HIGH SENSITIVITY)  TROPONIN I (HIGH SENSITIVITY)    EKG None  Radiology US Venous Img Lower Unilateral Right  Result Date: 04/08/2022 CLINICAL DATA:  Right lower extremity swelling. EXAM: Right LOWER EXTREMITY VENOUS DOPPLER ULTRASOUND TECHNIQUE: Gray-scale sonography with graded compression, as well as color Doppler and duplex ultrasound were performed to evaluate the lower extremity deep venous systems from the level of the common femoral vein and including the common femoral, femoral, profunda femoral, popliteal and calf veins including the posterior tibial, peroneal and gastrocnemius veins when visible. The superficial great saphenous vein was also interrogated. Spectral Doppler was utilized to evaluate flow at rest and with distal augmentation maneuvers in the common femoral, femoral and popliteal veins. COMPARISON:  July 26, 2021. FINDINGS: Contralateral Common Femoral Vein: Respiratory phasicity is normal and symmetric with the symptomatic side. No evidence of thrombus. Normal  compressibility. Common Femoral Vein: No evidence of thrombus. Normal compressibility, respiratory phasicity and response to augmentation. Saphenofemoral Junction: No evidence of thrombus. Normal compressibility and flow on color Doppler imaging. Profunda Femoral Vein: No evidence of thrombus. Normal compressibility and flow on color Doppler imaging. Femoral Vein: Noncompressible with no flow consistent with occlusive thrombus. Popliteal Vein: Noncompressible with no flow consistent with occlusive thrombus. Calf Veins: No evidence of thrombus. Normal compressibility and flow on color Doppler imaging. Superficial Great Saphenous Vein: No evidence of thrombus. Normal compressibility. Venous Reflux:  None. Other Findings:  None. IMPRESSION: Occlusive deep venous thrombosis is noted in the right femoral and popliteal veins. This was present on the prior exam of July 26, 2021. Electronically Signed   By: Marijo Conception M.D.   On: 04/08/2022 12:22   CT L-SPINE NO CHARGE  Result Date: 04/08/2022 CLINICAL DATA:  66 year old male status post lumbar spine fusion last week. EXAM: CT LUMBAR SPINE WITH CONTRAST TECHNIQUE: Technique: Multiplanar CT images of the lumbar spine were reconstructed from contemporary CT of the Abdomen and Pelvis. RADIATION DOSE REDUCTION: This exam was performed according to the departmental dose-optimization program which includes automated exposure control, adjustment of the mA and/or kV according to patient size and/or use of iterative reconstruction technique. CONTRAST:  No additional. COMPARISON:  CTA Chest, Abdomen, and Pelvis today are reported separately. Lumbar MRI 05/24/2021. FINDINGS: Segmentation: Normal, the same numbering system used on the MRI this year. Alignment: Lumbar lordosis not significantly changed. No significant scoliosis or spondylolisthesis. Vertebrae: Postoperative details are below. Lower thoracic spine ankylosis through L1-L2 related to flowing endplate osteophytes.  Associated T12 costovertebral junction ankylosis. Interbody ankylosis also at L3-L4 and L5-S1 secondary to bulky endplate osteophytes. Visible sacrum and SI joints appear intact. No acute osseous abnormality identified. Paraspinal and other soft tissues: Abdomen and pelvis are reported separately today. Postoperative lumbar paraspinal soft tissue changes with no adverse features. Disc levels: Lower thoracic through L1-L2 interbody ankylosis. L2-L3: Bilateral pedicle screws and right side interbody implant. Satisfactory hardware appearance. Right side posterior decompression. L3-L4: Bilateral pedicle screws and right side interbody implant. Satisfactory hardware appearance. Pre-existing interbody ankylosis at this level. L4-L5: Bilateral pedicle screws. Right side interbody implant and some bone graft material is somewhat posteriorly situated on the right (series 6, image 34). Involvement of some of the right L4 neural foramen, although some right side posterior element decompression has also occurred at  this level. Furthermore, the right L5 pedicle screw partially traverses the right L5 neural foramen as seen on sagittal image 32 and coronal image 58. No other adverse hardware features. L5-S1:  Chronic interbody ankylosis. IMPRESSION: 1. Underlying lower thoracic through L1-L2 ankylosis, and ankylosis also at L3-L4 and L5-S1, due to bulky endplate osteophytes. 2. Superimposed L2-L3 through L4-L5 posterior and interbody fusion hardware placement. The L4-L5 interbody implant is somewhat posteriorly positioned and encroaches on the right L4 neural foramen, while the right L5 pedicle screw partially traverses the right L5 neural foramen. 3. CTA Chest, Abdomen And Pelvis today are reported separately. Electronically Signed   By: Genevie Ann M.D.   On: 04/08/2022 10:13   CT Angio Chest PE W and/or Wo Contrast  Result Date: 04/08/2022 CLINICAL DATA:  High probability of pulmonary embolism. EXAM: CT ANGIOGRAPHY CHEST WITH  CONTRAST TECHNIQUE: Multidetector CT imaging of the chest was performed using the standard protocol during bolus administration of intravenous contrast. Multiplanar CT image reconstructions and MIPs were obtained to evaluate the vascular anatomy. RADIATION DOSE REDUCTION: This exam was performed according to the departmental dose-optimization program which includes automated exposure control, adjustment of the mA and/or kV according to patient size and/or use of iterative reconstruction technique. CONTRAST:  122m OMNIPAQUE IOHEXOL 300 MG/ML  SOLN COMPARISON:  July 26, 2021. FINDINGS: Cardiovascular: Large filling defect is seen in the filling defect of the upper lobe branch of the right pulmonary artery, consistent with acute pulmonary embolus. RV/LV ratio of 1.3 is noted suggesting right heart strain. Mild cardiomegaly is noted. No pericardial effusion. Moderate to severe coronary artery calcifications are noted. Mediastinum/Nodes: No enlarged mediastinal, hilar, or axillary lymph nodes. Thyroid gland, trachea, and esophagus demonstrate no significant findings. Lungs/Pleura: No pneumothorax or pleural effusion is noted. Hypoinflation of the lungs is noted with mild bibasilar subsegmental atelectasis. Musculoskeletal: No chest wall abnormality. No acute or significant osseous findings. Review of the MIP images confirms the above findings. IMPRESSION: Acute pulmonary embolus is noted in the upper lobe branch of the right pulmonary artery. Positive for acute PE with CT evidence of right heart strain (RV/LV Ratio = 1.3) consistent with at least submassive (intermediate risk) PE. The presence of right heart strain has been associated with an increased risk of morbidity and mortality. Please refer to the "Code PE Focused" order set in EPIC. Critical Value/emergent results were called by telephone at the time of interpretation on 04/08/2022 at 10:07 am to [facility], who verbally acknowledged these results.  Moderate to severe coronary calcifications are noted suggesting coronary artery disease. Hypoinflation of the lungs is noted with mild bibasilar subsegmental atelectasis. Aortic Atherosclerosis (ICD10-I70.0). Electronically Signed   By: JMarijo ConceptionM.D.   On: 04/08/2022 10:07   CT ABDOMEN PELVIS W CONTRAST  Result Date: 04/08/2022 CLINICAL DATA:  Abdominal pain.  Bowel obstruction suspected. EXAM: CT ABDOMEN AND PELVIS WITH CONTRAST TECHNIQUE: Multidetector CT imaging of the abdomen and pelvis was performed using the standard protocol following bolus administration of intravenous contrast. RADIATION DOSE REDUCTION: This exam was performed according to the departmental dose-optimization program which includes automated exposure control, adjustment of the mA and/or kV according to patient size and/or use of iterative reconstruction technique. CONTRAST:  1028mOMNIPAQUE IOHEXOL 300 MG/ML  SOLN COMPARISON:  10/02/2021 FINDINGS: Lower chest: See report for chest CTA performed at the same time and dictated separately. Hepatobiliary: No suspicious focal abnormality within the liver parenchyma. There is no evidence for gallstones, gallbladder wall thickening, or  pericholecystic fluid. No intrahepatic or extrahepatic biliary dilation. Pancreas: No focal mass lesion. No dilatation of the main duct. No intraparenchymal cyst. No peripancreatic edema. Spleen: No splenomegaly. No focal mass lesion. Adrenals/Urinary Tract: No adrenal nodule or mass. Several small nonobstructing stones are seen in the right kidney measuring up to 4 mm. 3 tiny 1-2 mm nonobstructing stones are seen in the left kidney. Tiny interpolar left renal cyst evident. No followup imaging is recommended. No evidence for hydroureter. Bladder obscured by beam hardening artifact from bilateral hip replacement. Stomach/Bowel: Stomach is unremarkable. No gastric wall thickening. No evidence of outlet obstruction. Duodenum is normally positioned as is the  ligament of Treitz. No small bowel wall thickening. No small bowel dilatation. The terminal ileum is normal. The appendix is normal. Right and transverse colon is markedly distended with gas and stool. Ascending colon measures up to 9.2 cm diameter. Transverse colon measures up to 8.1 cm diameter. Colon gradually tapers through the descending and sigmoid segments into the rectum. There is no abrupt or discrete transition zone evident. Fluid is visible in the rectum. No colonic wall thickening or colonic wall pneumatosis. No substantial pericolonic edema or inflammation. Vascular/Lymphatic: There is moderate atherosclerotic calcification of the abdominal aorta without aneurysm. There is no gastrohepatic or hepatoduodenal ligament lymphadenopathy. No retroperitoneal or mesenteric lymphadenopathy. No pelvic sidewall lymphadenopathy. Reproductive: Prostate gland is obscured by beam hardening artifact. Other: Trace fluid is seen in the para colic gutters bilaterally. Musculoskeletal: Status post bilateral hip replacement. Lumbar fusion hardware evident. IMPRESSION: 1. Right and transverse colon is markedly distended with gas and stool. Colon gradually tapers through the descending and sigmoid segments into the rectum. There is no abrupt or discrete transition zone evident. Fluid is visible in the rectum. 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 2. Trace fluid in the para colic gutters bilaterally. 3. Bilateral nonobstructing renal stones. 4. See report for chest CTA performed at the same time and dictated separately. 5.  Aortic Atherosclerosis (ICD10-I70.0). Electronically Signed   By: Misty Stanley M.D.   On: 04/08/2022 10:06    Procedures .Critical Care  Performed by: Regan Lemming, MD Authorized by: Regan Lemming, MD   Critical care provider statement:    Critical care time (minutes):  45   Critical care was time spent personally by me on the following activities:  Development  of treatment plan with patient or surrogate, discussions with consultants, evaluation of patient's response to treatment, examination of patient, ordering and review of laboratory studies, ordering and review of radiographic studies, ordering and performing treatments and interventions, pulse oximetry, re-evaluation of patient's condition and review of old charts     Medications Ordered in ED Medications  heparin ADULT infusion 100 units/mL (25000 units/29m) (1,500 Units/hr Intravenous New Bag/Given 04/08/22 1057)  lactated ringers infusion ( Intravenous New Bag/Given 04/08/22 1224)  lactated ringers bolus 1,000 mL (1,000 mLs Intravenous New Bag/Given 04/08/22 0833)  ondansetron (ZOFRAN) injection 4 mg (4 mg Intravenous Given 04/08/22 0834)  fentaNYL (SUBLIMAZE) injection 50 mcg (50 mcg Intravenous Given 04/08/22 0906)  iohexol (OMNIPAQUE) 300 MG/ML solution 100 mL (100 mLs Intravenous Contrast Given 04/08/22 0955)  fentaNYL (SUBLIMAZE) injection 50 mcg (50 mcg Intravenous Given 04/08/22 1015)  heparin bolus via infusion 5,600 Units (5,600 Units Intravenous Bolus from Bag 04/08/22 1057)    ED Course/ Medical Decision Making/ A&P  Medical Decision Making Amount and/or Complexity of Data Reviewed Labs: ordered. Radiology: ordered.  Risk Prescription drug management. Decision regarding hospitalization.    66 year old male with medical history significant for GERD, HLD, gout, hypothyroidism, DM 2, DJD, OSA, DVT not currently on anticoagulation who presents to the emergency department with abdominal distention, abdominal pain, cough, shortness of breath.  The patient states that he is almost 1 week out postoperatively from L2-L5 decompression and fusion with TLIF instrumentation and bone grafting with orthopedics at [facility] in [facility].  The patient states that he has not had a significant bowel movement since discharge from the hospital.  He endorses  generalized abdominal pain and distention.  He denies any vomiting.  He endorses mild nausea.  Additionally, his anticoagulation has been on hold for surgery and he was scheduled to restart Lovenox this week.  He has a history of DVT in the right lower extremity has chronic swelling in that extremity.  He endorses a new cough and shortness of breath.  Denies any fevers or chills.  He is worried about constipation as he has had worsening abdominal distention and pain and has had no success with home MiraLAX, senna, prune juice and 2 at home enemas.  He denies any new numbness or weakness in the lower extremities, denies any urinary or fecal incontinence, denies any saddle anesthesia, denies any recent falls.   On arrival, the patient was afebrile, low-grade elevated temperature to 99.1, tachycardic P1 35, not tachypneic RR 16, BP 122/83, saturating 93% on room air.  Sinus tachycardia noted on cardiac telemetry.  Physical exam significant for an abdomen that was distended, mildly tender to palpation, sinus tachycardia noted, some right lower extremity swelling.  Differential diagnosis includes DVT/PE, COVID-19, pneumonia, pneumothorax, viral URI, constipation, small bowel obstruction, bowel perforation.  Patient's wife states that she has tried to at home enemas and there is been no stool in the rectal vault.  Patient has no red flag symptoms other than recent spinal surgery and has a normal neurologic exam.  Laboratory evaluation concerning for lactic acidosis to 2.2 and subsequently to 2.3, initial troponin negative, BNP unremarkable, CBC with a mild leukocytosis to 4.1, anemia to 10.4, BMP with evidence of an AKI with creatinine of 1.41 from baseline of normal, COVID-19 influenza PCR testing was collected and resulted normal.  CTA imaging PE was concerning for acute PE with right heart strain: IMPRESSION:  Acute pulmonary embolus is noted in the upper lobe branch of the  right pulmonary artery. Positive for  acute PE with CT evidence of  right heart strain (RV/LV Ratio = 1.3) consistent with at least  submassive (intermediate risk) PE. The presence of right heart  strain has been associated with an increased risk of morbidity and  mortality. Please refer to the "Code PE Focused" order set in EPIC.  Critical Value/emergent results were called by telephone at the time  of interpretation on 04/08/2022 at 10:07 am to provider [facility] , who verbally acknowledged these results.    Moderate to severe coronary calcifications are noted suggesting  coronary artery disease.    Hypoinflation of the lungs is noted with mild bibasilar subsegmental  atelectasis.    Aortic Atherosclerosis (ICD10-I70.0).   I discussed the patient's care with on-call critical care who agreed patient based on CT imaging could be de-escalated to stepdown care rather than ICU care.  The patient was started on heparin gtt.  CT abdomen pelvis was performed which revealed evidence of an  ileus: IMPRESSION:  1. Right and transverse colon is markedly distended with gas and  stool. Colon gradually tapers through the descending and sigmoid  segments into the rectum. There is no abrupt or discrete transition  zone evident. Fluid is visible in the rectum. Imaging features are  most suggestive of with colonic dysmotility/ileus.  2. Trace fluid in the para colic gutters bilaterally.  3. Bilateral nonobstructing renal stones.  4. See report for chest CTA performed at the same time and dictated  separately.  5.  Aortic Atherosclerosis (ICD10-I70.0).    Ileus present, likely postoperative, no bowel obstruction on CT, patient will likely benefit from inpatient bowel regimen.  CT L Spine: IMPRESSION:  1. Underlying lower thoracic through L1-L2 ankylosis, and ankylosis  also at L3-L4 and L5-S1, due to bulky endplate osteophytes.    2. Superimposed L2-L3 through L4-L5 posterior and interbody fusion  hardware placement.  The L4-L5  interbody implant is somewhat posteriorly positioned and  encroaches on the right L4 neural foramen, while the right L5  pedicle screw partially traverses the right L5 neural foramen.    3. CTA Chest, Abdomen And Pelvis today are reported separately.    DVT US RLE:  IMPRESSION:  Occlusive deep venous thrombosis is noted in the right femoral and  popliteal veins. This was present on the prior exam of July 26, 2021.   Patient was informed of the diagnostic testing results and ultimate plan of care.  Hospitalist medicine consulted for admission for further management.  Dr. Marylyn Ishihara of hospitalist medicine subsequently excepted the patient in admission.  The patient was escalated to 2 L O2 via nasal cannula prior to admission.  He remained hemodynamically stable at time of admission, tachycardic but normotensive.   Final Clinical Impression(s) / ED Diagnoses Final diagnoses:  AKI (acute kidney injury) ([place])  PE (pulmonary thromboembolism) ([place])  Ileus ([place])  Deep vein thrombosis (DVT) of femoral vein of right lower extremity, unspecified chronicity ([place])    Rx / DC Orders ED Discharge Orders     None         Regan Lemming, MD 04/08/22 1317

## 2022-04-08 NOTE — Assessment & Plan Note (Addendum)
Recurrent. Patient with recent DVT/PE in 07/2021 and completed 6 months of Eliquis. CTA chest this admission confirms right acute PE. Right LE venous duplex is significant for chronic DVT. LLE venous duplex negative for acute DVT. Patient started on heparin IV for treatment. Transthoracic Echocardiogram ordered and significant for mild RV dilation with normal function. Hematology consulted and recommended lifelong anticoagulation; discussed resumption of Eliquis with patient who is in agreement. -Continue Heparin IV until hemoglobin shows stability; likely transition to Eliquis on 11/29

## 2022-04-08 NOTE — ED Triage Notes (Signed)
Pt had rods/screws placed l4-5 on Tuesday and dc Wednesday. Lbm Tuesday. Tried miralax, senna, prune juice, 2 enemas, .

## 2022-04-08 NOTE — H&P (Addendum)
History and Physical    Kyle Delacruz JYN:[id number] DOB: 1955-07-16 DOA: 04/08/2022  DOS: the patient was seen and examined on 04/08/2022  PCP: Prince Solian, MD   Patient coming from: Home  I have personally briefly reviewed patient's old medical records in [place]  CC: SOB, tachycardia, constipation HPI: Sick 66-year-old African-American male history of DVT in March 2023 after cervical spine surgery, history of hypertension, hypothyroidism, prior history of prostate cancer, OSA, presents to the ER today with tachycardia, shortness of breath and constipation.  Patient had lumbar fusion at [facility] on April 04, 2022.  He was released from the hospital on Thanksgiving 2023.  He was discharged home with aspirin 81 mg daily with instructions to start Lovenox 4 days later.  Patient and wife report that the patient was noted to have lots of bleeding during his lumbar surgery.  Patient had been treated at [facility] in March 2023 for a DVT and a PE.  This was in the setting of him having cervical spine surgery.  This was considered a provoked attack.  He was on Eliquis for 6 months and completed his therapy at the end of September/beginning of October.  His surgeon knew about his prior DVT/PE history.  Patient states that he got home on Thanksgiving.  Due to his back pain, he has been not as ambulatory as he normally is.  He had physical therapy come to his house on Saturday.  Therapist noted him to be tachycardic along with a swollen right leg.  He has been also constipated since Tuesday of last week.  Patient has tried oral laxatives and multiple enemas at home without success and having a normal bowel movement.  Due to worsening constipation, and abdominal distention, he came to the ER for evaluation.  During his evaluation, he was noted to have a heart rate of 135.  His O2 saturations dropped to 86%.  He is placed on oxygen.  He underwent lower  extremity ultrasound and CT PA.  Ultrasound showed a DVT of the right femoral popliteal and calf veins.  This is the same like he had his prior DVT in.  CT angio demonstrated acute PE of the distal main pulmonary arteries bilaterally extending into multiple upper and lower segmental branches.  Patient started on heparin and transferred to [facility].   ED Course: LE U/S shows R LE DVT. CTPA shows bilateral PE.  Review of Systems:  Review of Systems  Constitutional: Negative.   HENT: Negative.    Eyes: Negative.   Respiratory:  Positive for shortness of breath.   Cardiovascular:  Positive for leg swelling.  Gastrointestinal: Negative.   Genitourinary: Negative.   Musculoskeletal:  Positive for back pain.  Skin: Negative.   Neurological: Negative.   Endo/Heme/Allergies: Negative.   Psychiatric/Behavioral: Negative.    All other systems reviewed and are negative.   Past Medical History:  Diagnosis Date   Allergy    Arthritis    osteo   CTS (carpal tunnel syndrome)    DJD (degenerative joint disease)    DM2 (diabetes mellitus, type 2) (HCC)    DVT (deep venous thrombosis) (HCC)    GERD (gastroesophageal reflux disease)    Gout    History of adenomatous polyps of colon 12/29/2019   History of nuclear stress test    Myoview 11/17: EF 54, no ST changes, hypertensive blood pressure response, no ischemia, low risk   Hyperlipidemia    Hypothyroidism  Nicotine addiction    OSA (obstructive sleep apnea)    Prostatitis    Reactive airway disease    Sarcocystosis    Sinus arrhythmia    Sleep apnea    cpap- not wearing   Tinea corporis     Past Surgical History:  Procedure Laterality Date   BILATERAL KNEE ARTHROSCOPY     right- 1997; left 2001   COLONOSCOPY  multiple last 2013   KNEE SURGERY Right 2014   torn R medial meniscus- arthroscopy done   PARATHYROIDECTOMY  2005   ROTATOR CUFF REPAIR  2004   right   TONSILLECTOMY     TOTAL HIP ARTHROPLASTY Left 2009    TOTAL HIP ARTHROPLASTY Right 2006   UPPER GASTROINTESTINAL ENDOSCOPY       reports that he has quit smoking. His smoking use included cigarettes. He smoked an average of .5 packs per day. He has never used smokeless tobacco. He reports current alcohol use of about 21.0 standard drinks of alcohol per week. He reports that he does not use drugs.  Allergies  Allergen Reactions   Bee Venom     Other reaction(s): Unknown   Lisinopril     Other reaction(s): Unknown   Metoclopramide Other (See Comments)    Other reaction(s): shaking too bad "Sweat like crazy"    Penicillins Hives   Shellfish Allergy Swelling    Family History  Problem Relation Age of Onset   Stroke Mother    Diabetes Father    Heart disease Father        sp CABG   Diabetes Paternal Grandmother    Colon cancer Neg Hx    Stomach cancer Neg Hx    Esophageal cancer Neg Hx    Rectal cancer Neg Hx    Prostate cancer Neg Hx     Prior to Admission medications   Medication Sig Start Date End Date Taking? Authorizing Provider  allopurinol (ZYLOPRIM) 300 MG tablet Take 300 mg by mouth daily.  06/30/11   [provider]  CRESTOR 20 MG tablet Take 20 mg by mouth daily.  06/30/11   [provider]  cyclobenzaprine (FLEXERIL) 10 MG tablet Take 10 mg by mouth 3 (three) times daily as needed for muscle spasms. 07/18/21   [provider]  fluticasone (FLONASE) 50 MCG/ACT nasal spray INSTILL 2 SPRAYS IN EACH NOSTRIL DAILY 12/13/21   [provider]  HYDROcodone-acetaminophen (NORCO/VICODIN) 5-325 MG tablet Take 1 tablet by mouth 2 (two) times daily as needed. 02/05/22   [provider]  levothyroxine (SYNTHROID, LEVOTHROID) 50 MCG tablet Take 50 mcg by mouth daily. 02/18/15   [provider]  omeprazole (PRILOSEC) 40 MG capsule Take 40 mg by mouth 2 (two) times daily. 01/31/21   [provider]    Physical Exam: Vitals:   04/08/22 1500 04/08/22 1717 04/08/22 1823 04/08/22  1900  BP: 122/83  126/64   Pulse: (!) 112  (!) 115   Resp: 12     Temp:  98.3 F (36.8 C)  98.6 F (37 C)  TempSrc:  Oral  Oral  SpO2: 97%  96%   Weight:      Height:        Physical Exam Vitals and nursing note reviewed.  Constitutional:      General: He is not in acute distress.    Appearance: He is obese. He is not ill-appearing, toxic-appearing or diaphoretic.  HENT:     Head: Normocephalic and atraumatic.  Nose: Nose normal.  Eyes:     General: No scleral icterus. Cardiovascular:     Rate and Rhythm: Regular rhythm. Tachycardia present.  Pulmonary:     Effort: Pulmonary effort is normal. No respiratory distress.     Breath sounds: Normal breath sounds. No wheezing or rales.  Abdominal:     General: Bowel sounds are normal. There is distension.     Palpations: Abdomen is soft.     Tenderness: There is no abdominal tenderness. There is no guarding or rebound.     Comments: Bowel sounds are present and normal  Musculoskeletal:     Right lower leg: Edema present.     Left lower leg: Edema present.     Comments: R LE edema > L LE edema  Skin:    General: Skin is warm and dry.     Capillary Refill: Capillary refill takes less than 2 seconds.  Neurological:     General: No focal deficit present.     Mental Status: He is alert and oriented to person, place, and time.      Labs on Admission: I have personally reviewed following labs and imaging studies  CBC: Recent Labs  Lab 04/08/22 0818  WBC 12.1*  NEUTROABS 8.9*  HGB 10.4*  HCT 31.5*  MCV 93.5  PLT 998   Basic Metabolic Panel: Recent Labs  Lab 04/08/22 0818  NA 133*  K 3.7  CL 95*  CO2 27  GLUCOSE 178*  BUN 18  CREATININE 1.41*  CALCIUM 9.4   GFR: Estimated Creatinine Clearance: 63.7 mL/min (A) (by C-G formula based on SCr of 1.41 mg/dL (H)). Liver Function Tests: No results for input(s): "AST", "ALT", "ALKPHOS", "BILITOT", "PROT", "ALBUMIN" in the last 168 hours. Recent Labs  Lab  04/08/22 0818  LIPASE 42   No results for input(s): "AMMONIA" in the last 168 hours. Coagulation Profile: Recent Labs  Lab 04/08/22 1021  INR 1.1   Cardiac Enzymes: Recent Labs  Lab 04/08/22 1021 04/08/22 1314  TROPONINIHS 8 11   BNP (last 3 results) No results for input(s): "PROBNP" in the last 8760 hours. HbA1C: No results for input(s): "HGBA1C" in the last 72 hours. CBG: No results for input(s): "GLUCAP" in the last 168 hours. Lipid Profile: No results for input(s): "CHOL", "HDL", "LDLCALC", "TRIG", "CHOLHDL", "LDLDIRECT" in the last 72 hours. Thyroid Function Tests: No results for input(s): "TSH", "T4TOTAL", "FREET4", "T3FREE", "THYROIDAB" in the last 72 hours. Anemia Panel: No results for input(s): "VITAMINB12", "FOLATE", "FERRITIN", "TIBC", "IRON", "RETICCTPCT" in the last 72 hours. Urine analysis:    Component Value Date/Time   COLORURINE YELLOW 11/27/2007 0820   APPEARANCEUR CLEAR 11/27/2007 0820   LABSPEC >=1.030 04/26/2014 1212   PHURINE 5.5 04/26/2014 1212   GLUCOSEU NEGATIVE 04/26/2014 1212   HGBUR NEGATIVE 04/26/2014 1212   BILIRUBINUR NEGATIVE 04/26/2014 1212   KETONESUR NEGATIVE 04/26/2014 1212   PROTEINUR 100 (A) 04/26/2014 1212   UROBILINOGEN 0.2 04/26/2014 1212   NITRITE NEGATIVE 04/26/2014 1212   LEUKOCYTESUR NEGATIVE 04/26/2014 1212    Radiological Exams on Admission: I have personally reviewed images US Venous Img Lower Unilateral Right  Result Date: 04/08/2022 CLINICAL DATA:  Right lower extremity swelling. EXAM: Right LOWER EXTREMITY VENOUS DOPPLER ULTRASOUND TECHNIQUE: Gray-scale sonography with graded compression, as well as color Doppler and duplex ultrasound were performed to evaluate the lower extremity deep venous systems from the level of the common femoral vein and including the common femoral, femoral, profunda femoral, popliteal and calf veins including the  posterior tibial, peroneal and gastrocnemius veins when visible. The  superficial great saphenous vein was also interrogated. Spectral Doppler was utilized to evaluate flow at rest and with distal augmentation maneuvers in the common femoral, femoral and popliteal veins. COMPARISON:  July 26, 2021. FINDINGS: Contralateral Common Femoral Vein: Respiratory phasicity is normal and symmetric with the symptomatic side. No evidence of thrombus. Normal compressibility. Common Femoral Vein: No evidence of thrombus. Normal compressibility, respiratory phasicity and response to augmentation. Saphenofemoral Junction: No evidence of thrombus. Normal compressibility and flow on color Doppler imaging. Profunda Femoral Vein: No evidence of thrombus. Normal compressibility and flow on color Doppler imaging. Femoral Vein: Noncompressible with no flow consistent with occlusive thrombus. Popliteal Vein: Noncompressible with no flow consistent with occlusive thrombus. Calf Veins: No evidence of thrombus. Normal compressibility and flow on color Doppler imaging. Superficial Great Saphenous Vein: No evidence of thrombus. Normal compressibility. Venous Reflux:  None. Other Findings:  None. IMPRESSION: Occlusive deep venous thrombosis is noted in the right femoral and popliteal veins. This was present on the prior exam of July 26, 2021. Electronically Signed   By: Marijo Conception M.D.   On: 04/08/2022 12:22   CT L-SPINE NO CHARGE  Result Date: 04/08/2022 CLINICAL DATA:  66 year old male status post lumbar spine fusion last week. EXAM: CT LUMBAR SPINE WITH CONTRAST TECHNIQUE: Technique: Multiplanar CT images of the lumbar spine were reconstructed from contemporary CT of the Abdomen and Pelvis. RADIATION DOSE REDUCTION: This exam was performed according to the departmental dose-optimization program which includes automated exposure control, adjustment of the mA and/or kV according to patient size and/or use of iterative reconstruction technique. CONTRAST:  No additional. COMPARISON:  CTA Chest, Abdomen,  and Pelvis today are reported separately. Lumbar MRI 05/24/2021. FINDINGS: Segmentation: Normal, the same numbering system used on the MRI this year. Alignment: Lumbar lordosis not significantly changed. No significant scoliosis or spondylolisthesis. Vertebrae: Postoperative details are below. Lower thoracic spine ankylosis through L1-L2 related to flowing endplate osteophytes. Associated T12 costovertebral junction ankylosis. Interbody ankylosis also at L3-L4 and L5-S1 secondary to bulky endplate osteophytes. Visible sacrum and SI joints appear intact. No acute osseous abnormality identified. Paraspinal and other soft tissues: Abdomen and pelvis are reported separately today. Postoperative lumbar paraspinal soft tissue changes with no adverse features. Disc levels: Lower thoracic through L1-L2 interbody ankylosis. L2-L3: Bilateral pedicle screws and right side interbody implant. Satisfactory hardware appearance. Right side posterior decompression. L3-L4: Bilateral pedicle screws and right side interbody implant. Satisfactory hardware appearance. Pre-existing interbody ankylosis at this level. L4-L5: Bilateral pedicle screws. Right side interbody implant and some bone graft material is somewhat posteriorly situated on the right (series 6, image 34). Involvement of some of the right L4 neural foramen, although some right side posterior element decompression has also occurred at this level. Furthermore, the right L5 pedicle screw partially traverses the right L5 neural foramen as seen on sagittal image 32 and coronal image 58. No other adverse hardware features. L5-S1:  Chronic interbody ankylosis. IMPRESSION: 1. Underlying lower thoracic through L1-L2 ankylosis, and ankylosis also at L3-L4 and L5-S1, due to bulky endplate osteophytes. 2. Superimposed L2-L3 through L4-L5 posterior and interbody fusion hardware placement. The L4-L5 interbody implant is somewhat posteriorly positioned and encroaches on the right L4  neural foramen, while the right L5 pedicle screw partially traverses the right L5 neural foramen. 3. CTA Chest, Abdomen And Pelvis today are reported separately. Electronically Signed   By: Genevie Ann M.D.   On: 04/08/2022 10:13  CT Angio Chest PE W and/or Wo Contrast  Result Date: 04/08/2022 CLINICAL DATA:  High probability of pulmonary embolism. EXAM: CT ANGIOGRAPHY CHEST WITH CONTRAST TECHNIQUE: Multidetector CT imaging of the chest was performed using the standard protocol during bolus administration of intravenous contrast. Multiplanar CT image reconstructions and MIPs were obtained to evaluate the vascular anatomy. RADIATION DOSE REDUCTION: This exam was performed according to the departmental dose-optimization program which includes automated exposure control, adjustment of the mA and/or kV according to patient size and/or use of iterative reconstruction technique. CONTRAST:  175m OMNIPAQUE IOHEXOL 300 MG/ML  SOLN COMPARISON:  July 26, 2021. FINDINGS: Cardiovascular: Large filling defect is seen in the filling defect of the upper lobe branch of the right pulmonary artery, consistent with acute pulmonary embolus. RV/LV ratio of 1.3 is noted suggesting right heart strain. Mild cardiomegaly is noted. No pericardial effusion. Moderate to severe coronary artery calcifications are noted. Mediastinum/Nodes: No enlarged mediastinal, hilar, or axillary lymph nodes. Thyroid gland, trachea, and esophagus demonstrate no significant findings. Lungs/Pleura: No pneumothorax or pleural effusion is noted. Hypoinflation of the lungs is noted with mild bibasilar subsegmental atelectasis. Musculoskeletal: No chest wall abnormality. No acute or significant osseous findings. Review of the MIP images confirms the above findings. IMPRESSION: Acute pulmonary embolus is noted in the upper lobe branch of the right pulmonary artery. Positive for acute PE with CT evidence of right heart strain (RV/LV Ratio = 1.3) consistent with at  least submassive (intermediate risk) PE. The presence of right heart strain has been associated with an increased risk of morbidity and mortality. Please refer to the "Code PE Focused" order set in EPIC. Critical Value/emergent results were called by telephone at the time of interpretation on 04/08/2022 at 10:07 am to [facility], who verbally acknowledged these results. Moderate to severe coronary calcifications are noted suggesting coronary artery disease. Hypoinflation of the lungs is noted with mild bibasilar subsegmental atelectasis. Aortic Atherosclerosis (ICD10-I70.0). Electronically Signed   By: JMarijo ConceptionM.D.   On: 04/08/2022 10:07   CT ABDOMEN PELVIS W CONTRAST  Result Date: 04/08/2022 CLINICAL DATA:  Abdominal pain.  Bowel obstruction suspected. EXAM: CT ABDOMEN AND PELVIS WITH CONTRAST TECHNIQUE: Multidetector CT imaging of the abdomen and pelvis was performed using the standard protocol following bolus administration of intravenous contrast. RADIATION DOSE REDUCTION: This exam was performed according to the departmental dose-optimization program which includes automated exposure control, adjustment of the mA and/or kV according to patient size and/or use of iterative reconstruction technique. CONTRAST:  1045mOMNIPAQUE IOHEXOL 300 MG/ML  SOLN COMPARISON:  10/02/2021 FINDINGS: Lower chest: See report for chest CTA performed at the same time and dictated separately. Hepatobiliary: No suspicious focal abnormality within the liver parenchyma. There is no evidence for gallstones, gallbladder wall thickening, or pericholecystic fluid. No intrahepatic or extrahepatic biliary dilation. Pancreas: No focal mass lesion. No dilatation of the main duct. No intraparenchymal cyst. No peripancreatic edema. Spleen: No splenomegaly. No focal mass lesion. Adrenals/Urinary Tract: No adrenal nodule or mass. Several small nonobstructing stones are seen in the right kidney measuring up to 4 mm. 3 tiny  1-2 mm nonobstructing stones are seen in the left kidney. Tiny interpolar left renal cyst evident. No followup imaging is recommended. No evidence for hydroureter. Bladder obscured by beam hardening artifact from bilateral hip replacement. Stomach/Bowel: Stomach is unremarkable. No gastric wall thickening. No evidence of outlet obstruction. Duodenum is normally positioned as is the ligament of Treitz. No small bowel wall thickening. No small  bowel dilatation. The terminal ileum is normal. The appendix is normal. Right and transverse colon is markedly distended with gas and stool. Ascending colon measures up to 9.2 cm diameter. Transverse colon measures up to 8.1 cm diameter. Colon gradually tapers through the descending and sigmoid segments into the rectum. There is no abrupt or discrete transition zone evident. Fluid is visible in the rectum. No colonic wall thickening or colonic wall pneumatosis. No substantial pericolonic edema or inflammation. Vascular/Lymphatic: There is moderate atherosclerotic calcification of the abdominal aorta without aneurysm. There is no gastrohepatic or hepatoduodenal ligament lymphadenopathy. No retroperitoneal or mesenteric lymphadenopathy. No pelvic sidewall lymphadenopathy. Reproductive: Prostate gland is obscured by beam hardening artifact. Other: Trace fluid is seen in the para colic gutters bilaterally. Musculoskeletal: Status post bilateral hip replacement. Lumbar fusion hardware evident. IMPRESSION: 1. Right and transverse colon is markedly distended with gas and stool. Colon gradually tapers through the descending and sigmoid segments into the rectum. There is no abrupt or discrete transition zone evident. Fluid is visible in the rectum. 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 2. Trace fluid in the para colic gutters bilaterally. 3. Bilateral nonobstructing renal stones. 4. See report for chest CTA performed at the same time and dictated  separately. 5.  Aortic Atherosclerosis (ICD10-I70.0). Electronically Signed   By: Misty Stanley M.D.   On: 04/08/2022 10:06    EKG: My personal interpretation of EKG shows: no EKG performed  Assessment/Plan Principal Problem:   Acute pulmonary embolism (HCC) Active Problems:   Acute respiratory failure with hypoxia (HCC)   Leg DVT (deep venous thromboembolism), acute, right (HCC)   Hypothyroidism   Essential hypertension   Gastro-esophageal reflux disease without esophagitis   Obstructive sleep apnea (adult) (pediatric)   Constipation due to opioid therapy    Assessment and Plan: * Acute pulmonary embolism ([place]) Admit to stepdown unit.  On IV heparin infusion.  Check echocardiogram.  Since he had another DVT after surgery, I would not consider this a unprovoked VTE.  I did warn the patient and his wife that he is prone to developing DVTs when he is immobile.  I think 6 to 12 months of systemic anticoagulation is reasonable for him.  I did warn the patient and his wife that in the event that he becomes immobilized again either with another surgery or another illness that requires him to be nonambulatory for more than 12 hours, that they take precautions to keep him from developing another DVT in his right leg.  This may include wearing compression stockings, sequential compression devices, etc.  Wife and patient's state that they did see Dr. Donzetta Matters with vascular surgery for consideration of an IVC filter but after discussions with the vascular surgeon, they decided not to proceed with this.  It may be beneficial for the patient to be screened for hypercoagulable state after he has completed 6 to 12 months of systemic anticoagulation.  Patient tolerated Eliquis earlier this year without any difficulty.  Patient and wife did not mention any financial difficulty affording Eliquis.  He could transition to Eliquis after his echocardiogram tomorrow.  Leg DVT (deep venous thromboembolism), acute, right  [facility]) Patient has another DVT of his right leg.  Again this was a provoked attack since he had surgery this past Wednesday for lumbar fusion.  Continue IV heparin.  Transition to p.o. Eliquis when able.  Acute respiratory failure with hypoxia (HCC) Due to his acute PE.  Patient does not wear supplemental oxygen at home.  Wean as tolerated.  Constipation due to opioid therapy Patient does not had a bowel movement since Tuesday.  He has been taking oxycodone 10 mg for his pain 3-4 times a day.  Patient has taken multiple laxatives along with multiple enemas at home without any results.  Start Movantik 25 mg daily.  Will also prescribe Dulcolax.  Hopefully will get his bowels moving this evening.  I don't think he has a post-op ileus but rather opioid-induced constipation.  Obstructive sleep apnea (adult) (pediatric) Chronic.  Gastro-esophageal reflux disease without esophagitis Stable.  Essential hypertension Stable.  Hypothyroidism Stable.  Continue Synthroid 50 mcg daily.   DVT prophylaxis: IV heparin gtts Code Status: Full Code Family Communication: discussed at length with pt and his wife Felicia  Disposition Plan: return home  Consults called: none  Admission status: Inpatient, Step Down Unit   Kristopher Oppenheim, DO [facility] 04/08/2022, 8:38 PM

## 2022-04-08 NOTE — Subjective & Objective (Signed)
CC: SOB, tachycardia, constipation HPI: Sick 66-year-old African-American male history of DVT in March 2023 after cervical spine surgery, history of hypertension, hypothyroidism, prior history of prostate cancer, OSA, presents to the ER today with tachycardia, shortness of breath and constipation.  Patient had lumbar fusion at [facility] on April 04, 2022.  He was released from the hospital on Thanksgiving 2023.  He was discharged home with aspirin 81 mg daily with instructions to start Lovenox 4 days later.  Patient and wife report that the patient was noted to have lots of bleeding during his lumbar surgery.  Patient had been treated at [facility] in March 2023 for a DVT and a PE.  This was in the setting of him having cervical spine surgery.  This was considered a provoked attack.  He was on Eliquis for 6 months and completed his therapy at the end of September/beginning of October.  His surgeon knew about his prior DVT/PE history.  Patient states that he got home on Thanksgiving.  Due to his back pain, he has been not as ambulatory as he normally is.  He had physical therapy come to his house on Saturday.  Therapist noted him to be tachycardic along with a swollen right leg.  He has been also constipated since Tuesday of last week.  Patient has tried oral laxatives and multiple enemas at home without success and having a normal bowel movement.  Due to worsening constipation, and abdominal distention, he came to the ER for evaluation.  During his evaluation, he was noted to have a heart rate of 135.  His O2 saturations dropped to 86%.  He is placed on oxygen.  He underwent lower extremity ultrasound and CT PA.  Ultrasound showed a DVT of the right femoral popliteal and calf veins.  This is the same like he had his prior DVT in.  CT angio demonstrated acute PE of the distal main pulmonary arteries bilaterally extending into multiple upper and lower segmental  branches.  Patient started on heparin and transferred to [facility] long hospital.

## 2022-04-08 NOTE — Progress Notes (Signed)
Plan of Care Note for accepted transfer   Patient: Kyle Delacruz MRN: [id number]   DOA: 04/08/2022  Facility requesting transfer: [facility] Requesting Provider: Dr. Armandina Gemma Reason for transfer: Acute PE Facility course: 66 yo M w/ PMHx of DVT, DM2, hypothyroidism, prostate CA, HTN. Presenting w/ cough and shortness of breath. He had a recent L2- L5 fusion w/ [facility] 1 week ago. Since that time he has not had a BM and he has become increasingly short of breath. W/u revealed acute PE. His BNP is 40. His initial trp is 8. CT show some evidence of right heart strain. He was started on heparin gtt. He was also found to have a post-op ileus. EDP spoke with PCCM, rec'd SDU admission to medicine.   Plan of care: The patient is accepted for admission to [facility] unit, at [facility]. While holding at [facility], medical decision making responsibilities remain with the [place] [facility]. Upon arrival to [facility], [facility] will assume care. Thank you.   Author: Jonnie Finner, DO 04/08/2022  Check [email or web address] for on-call coverage.  Nursing staff, Please call [place] number on Amion as soon as patient's arrival, so appropriate admitting provider can evaluate the pt.

## 2022-04-08 NOTE — Assessment & Plan Note (Signed)
Stable

## 2022-04-09 ENCOUNTER — Inpatient Hospital Stay (HOSPITAL_COMMUNITY): Payer: Medicare Other

## 2022-04-09 DIAGNOSIS — I2602 Saddle embolus of pulmonary artery with acute cor pulmonale: Secondary | ICD-10-CM

## 2022-04-09 DIAGNOSIS — R609 Edema, unspecified: Secondary | ICD-10-CM

## 2022-04-09 DIAGNOSIS — I82501 Chronic embolism and thrombosis of unspecified deep veins of right lower extremity: Secondary | ICD-10-CM

## 2022-04-09 DIAGNOSIS — I2699 Other pulmonary embolism without acute cor pulmonale: Secondary | ICD-10-CM

## 2022-04-09 DIAGNOSIS — J9601 Acute respiratory failure with hypoxia: Secondary | ICD-10-CM | POA: Diagnosis not present

## 2022-04-09 DIAGNOSIS — E039 Hypothyroidism, unspecified: Secondary | ICD-10-CM

## 2022-04-09 DIAGNOSIS — I82411 Acute embolism and thrombosis of right femoral vein: Secondary | ICD-10-CM

## 2022-04-09 DIAGNOSIS — I10 Essential (primary) hypertension: Secondary | ICD-10-CM | POA: Diagnosis not present

## 2022-04-09 LAB — ECHOCARDIOGRAM COMPLETE
Area-P 1/2: 4.1 cm2
Calc EF: 69.7 %
Height: 69.016 in
S' Lateral: 2.5 cm
Single Plane A2C EF: 68 %
Single Plane A4C EF: 71.6 %
Weight: 4042.35 oz

## 2022-04-09 LAB — HIV ANTIBODY (ROUTINE TESTING W REFLEX): HIV Screen 4th Generation wRfx: NONREACTIVE

## 2022-04-09 LAB — CBC
HCT: 29.2 % — ABNORMAL LOW (ref 39.0–52.0)
Hemoglobin: 9.4 g/dL — ABNORMAL LOW (ref 13.0–17.0)
MCH: 31.3 pg (ref 26.0–34.0)
MCHC: 32.2 g/dL (ref 30.0–36.0)
MCV: 97.3 fL (ref 80.0–100.0)
Platelets: 188 10*3/uL (ref 150–400)
RBC: 3 MIL/uL — ABNORMAL LOW (ref 4.22–5.81)
RDW: 13.6 % (ref 11.5–15.5)
WBC: 10.4 10*3/uL (ref 4.0–10.5)
nRBC: 0 % (ref 0.0–0.2)

## 2022-04-09 LAB — HEPARIN LEVEL (UNFRACTIONATED): Heparin Unfractionated: 0.65 IU/mL (ref 0.30–0.70)

## 2022-04-09 MED ORDER — PANTOPRAZOLE SODIUM 40 MG PO TBEC
40.0000 mg | DELAYED_RELEASE_TABLET | Freq: Two times a day (BID) | ORAL | Status: DC
  Administered 2022-04-09 – 2022-04-10 (×4): 40 mg via ORAL
  Filled 2022-04-09 (×4): qty 1

## 2022-04-09 MED ORDER — ONDANSETRON HCL 4 MG PO TABS: 4.0000 mg | ORAL_TABLET | Freq: Four times a day (QID) | ORAL | Status: DC | PRN

## 2022-04-09 MED ORDER — OXYCODONE HCL 5 MG PO TABS
10.0000 mg | ORAL_TABLET | ORAL | Status: DC | PRN
  Administered 2022-04-09 (×3): 10 mg via ORAL
  Filled 2022-04-09 (×3): qty 2

## 2022-04-09 MED ORDER — ACETAMINOPHEN 325 MG PO TABS: 650.0000 mg | ORAL_TABLET | Freq: Four times a day (QID) | ORAL | Status: DC | PRN

## 2022-04-09 MED ORDER — HYDROMORPHONE HCL 1 MG/ML IJ SOLN
0.5000 mg | INTRAMUSCULAR | Status: DC | PRN
  Administered 2022-04-09 – 2022-04-10 (×3): 1 mg via INTRAVENOUS
  Administered 2022-04-12 (×2): 0.5 mg via INTRAVENOUS
  Administered 2022-04-13 – 2022-04-15 (×4): 1 mg via INTRAVENOUS
  Filled 2022-04-09 (×10): qty 1

## 2022-04-09 MED ORDER — ROSUVASTATIN CALCIUM 20 MG PO TABS
20.0000 mg | ORAL_TABLET | Freq: Every day | ORAL | Status: DC
  Administered 2022-04-09 – 2022-04-10 (×2): 20 mg via ORAL
  Filled 2022-04-09 (×2): qty 1

## 2022-04-09 MED ORDER — ACETAMINOPHEN 650 MG RE SUPP: 650.0000 mg | Freq: Four times a day (QID) | RECTAL | Status: DC | PRN

## 2022-04-09 MED ORDER — LEVOTHYROXINE SODIUM 50 MCG PO TABS
50.0000 ug | ORAL_TABLET | Freq: Every day | ORAL | Status: DC
  Administered 2022-04-09 – 2022-04-10 (×2): 50 ug via ORAL
  Filled 2022-04-09 (×2): qty 1

## 2022-04-09 MED ORDER — ONDANSETRON HCL 4 MG/2ML IJ SOLN
4.0000 mg | Freq: Four times a day (QID) | INTRAMUSCULAR | Status: DC | PRN
  Administered 2022-04-17: 4 mg via INTRAVENOUS
  Filled 2022-04-09: qty 2

## 2022-04-09 MED ORDER — SORBITOL 70 % SOLN
960.0000 mL | TOPICAL_OIL | Freq: Once | ORAL | Status: AC
  Administered 2022-04-09: 960 mL via RECTAL
  Filled 2022-04-09: qty 240

## 2022-04-09 NOTE — Consult Note (Signed)
[place]  Telephone:(336) [place]   Mal[place]ki Gasaway [facility]  DOB: 05/22/1955  MR#: [id number]  CSN#: [id number]    Requesting Physician: [facility] Dr. Lonny Prude   Patient Care Team: Prince Solian, MD as PCP - General (Internal Medicine) Belva Crome, MD as PCP - Cardiology (Cardiology)  Reason for consult: recurrent DVT/PE  History of present illness:   Kyle Delacruz is a 66 yo male with past medical history of hypertension, hypothyroidism, prostate cancer on watchful wait, OSA, obesity, who was admitted for recurrent DVT and PE after his recent spine surgery.  He underwent cervical spine surgery in March 2023, developed DVT and PE 2 days after surgery and was admitted to [facility].  He was on Eliquis for 5 to 6 months.  Repeated Doppler of lower extremity showed chronic nonocclusive DVT in the right lower extremity in May, August and again October 2023.  He was seen by vascular surgeon Dr. Donzetta Matters on 02/14/2022 before his lumbar spine surgery, and was cleared for surgery.  Dr. Donzetta Matters recommended lovenox therapy postop (as soon as it's safe to do after surgery).  Patient underwent lumbar spine surgery in [facility] on April 03, 2022, was discharged home on 04/05/2022. He was supposed to start Lovenox 4 days after.  He developed right lower extremity edema 1 to 2 days after the surgery, and he noticed shortness of breath 2 to 3 days after surgery.  He presents to the emergency room, and was admitted to [facility] on 11/26.  Ultrasound showed DVT of the right femoral popliteal and calf veins, CTA showed acute PE of the distal main pulmonary arteries bilaterally extending into multiple upper and lower segmental branches.  He was mildly hypoxic, and was put on oxygen.  He states his right lower extremity edema and dyspnea has improved since admission, his wife and sister were at the bedside when I saw him.  MEDICAL HISTORY:   Past Medical History:  Diagnosis Date   Allergy    Arthritis    osteo   CTS (carpal tunnel syndrome)    DJD (degenerative joint disease)    DM2 (diabetes mellitus, type 2) (HCC)    DVT (deep venous thrombosis) (HCC)    GERD (gastroesophageal reflux disease)    Gout    History of adenomatous polyps of colon 12/29/2019   History of nuclear stress test    Myoview 11/17: EF 54, no ST changes, hypertensive blood pressure response, no ischemia, low risk   Hyperlipidemia    Hypothyroidism    Nicotine addiction    OSA (obstructive sleep apnea)    Prostatitis    Reactive airway disease    Sarcocystosis    Sinus arrhythmia    Sleep apnea    cpap- not wearing   Tinea corporis     SURGICAL HISTORY: Past Surgical History:  Procedure Laterality Date   BILATERAL KNEE ARTHROSCOPY     right- 1997; left 2001   COLONOSCOPY  multiple last 2013   KNEE SURGERY Right 2014   torn R medial meniscus- arthroscopy done   PARATHYROIDECTOMY  2005   ROTATOR CUFF REPAIR  2004   right   TONSILLECTOMY     TOTAL HIP ARTHROPLASTY Left 2009   TOTAL HIP ARTHROPLASTY Right 2006   UPPER GASTROINTESTINAL ENDOSCOPY      SOCIAL HISTORY: Social History   Socioeconomic History   Marital status: Married    Spouse name: Not on file  Number of children: 0   Years of education: Not on file   Highest education level: Not on file  Occupational History   Occupation: retired postal  Tobacco Use   Smoking status: Former    Packs/day: 0.50    Types: Cigarettes   Smokeless tobacco: Never   Tobacco comments:    Quit March 2020  Vaping Use   Vaping Use: Never used  Substance and Sexual Activity   Alcohol use: Yes    Alcohol/week: 21.0 standard drinks of alcohol    Types: 21 Cans of beer per week    Comment: 2 beers per day   Drug use: No   Sexual activity: Not on file  Other Topics Concern   Not on file  Social History Narrative   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    Single   No children   4 years in [place] [employer] [place], [place]   Former smoker 1 alcoholic beverage daily no other tobacco or drug use   Social Determinants of Radio broadcast assistant Strain: Not on file  Food Insecurity: No Food Insecurity (04/09/2022)   Hunger Vital Sign    Worried About Running Out of Food in the Last Year: Never true    [employer] [place] in the Last Year: Never true  Transportation Needs: No Transportation Needs (04/09/2022)   PRAPARE - Hydrologist (Medical): No    Lack of Transportation (Non-Medical): No  Physical Activity: Not on file  Stress: Not on file  Social Connections: Not on file  Intimate Partner Violence: Not At Risk (04/09/2022)   Humiliation, Afraid, Rape, and Kick questionnaire    Fear of Current or Ex-Partner: No    Emotionally Abused: No    Physically Abused: No    Sexually Abused: No    FAMILY HISTORY: Family History  Problem Relation Age of Onset   Stroke Mother    Diabetes Father    Heart disease Father        sp CABG   Diabetes Paternal Grandmother    Colon cancer Neg Hx    Stomach cancer Neg Hx    Esophageal cancer Neg Hx    Rectal cancer Neg Hx    Prostate cancer Neg Hx     ALLERGIES:  is allergic to bee venom, lisinopril, metoclopramide, penicillins, and shellfish allergy.  MEDICATIONS:  Current Facility-Administered Medications  Medication Dose Route Frequency Provider Last Rate Last Admin   acetaminophen (TYLENOL) tablet 650 mg  650 mg Oral Q6H PRN Kristopher Oppenheim, DO       Or   acetaminophen (TYLENOL) suppository 650 mg  650 mg Rectal Q6H PRN Kristopher Oppenheim, DO       Chlorhexidine Gluconate Cloth 2 % PADS 6 each  6 each Topical Daily Kristopher Oppenheim, DO   6 each at 04/09/22 1618   heparin ADULT infusion 100 units/mL (25000 units/223m)  1,500 Units/hr Intravenous Continuous CKristopher Oppenheim DO 15 mL/hr at 04/09/22 1000 1,500 Units/hr at 04/09/22 1000   HYDROmorphone (DILAUDID) injection 0.5-1 mg  0.5-1 mg  Intravenous Q2H PRN CKristopher Oppenheim DO   1 mg at 04/09/22 1520   levothyroxine (SYNTHROID) tablet 50 mcg  50 mcg Oral Daily CKristopher Oppenheim DO   50 mcg at 04/09/22 0558   naloxegol oxalate (MOVANTIK) tablet 25 mg  25 mg Oral Daily CKristopher Oppenheim DO   25 mg at 04/09/22 0832   ondansetron (ZOFRAN) tablet 4 mg  4 mg  Oral Q6H PRN Kristopher Oppenheim, DO       Or   ondansetron [facility]) injection 4 mg  4 mg Intravenous Q6H PRN Kristopher Oppenheim, DO       Oral care mouth rinse  15 mL Mouth Rinse PRN Kristopher Oppenheim, DO       oxyCODONE (Oxy IR/ROXICODONE) immediate release tablet 10 mg  10 mg Oral Q4H PRN Kristopher Oppenheim, DO   10 mg at 04/09/22 0558   pantoprazole (PROTONIX) EC tablet 40 mg  40 mg Oral BID Kristopher Oppenheim, DO   40 mg at 04/09/22 4970   rosuvastatin (CRESTOR) tablet 20 mg  20 mg Oral Daily Kristopher Oppenheim, DO   20 mg at 04/09/22 2637    REVIEW OF SYSTEMS:   Constitutional: Denies fevers, chills or abnormal night sweats Eyes: Denies blurriness of vision, double vision or watery eyes Ears, nose, mouth, throat, and face: Denies mucositis or sore throat Respiratory: Denies cough, dyspnea or wheezes Cardiovascular: Denies palpitation, chest discomfort or lower extremity swelling Gastrointestinal:  Denies nausea, heartburn or change in bowel habits, (+) constipation  Skin: Denies abnormal skin rashes Lymphatics: Denies new lymphadenopathy or easy bruising Neurological:Denies numbness, tingling or new weaknesses Behavioral/Psych: Mood is stable, no new changes  All other systems were reviewed with the patient and are negative.  PHYSICAL EXAMINATION: ECOG PERFORMANCE STATUS: 2 - Symptomatic, <50% confined to bed  Vitals:   04/09/22 1520 04/09/22 1600  BP:  132/71  Pulse:  (!) 109  Resp: (!) 36 20  Temp:  98.3 F (36.8 C)  SpO2:  95%   Filed Weights   04/08/22 1004 04/09/22 0500  Weight: 235 lb (106.6 kg) 252 lb 10.4 oz (114.6 kg)    GENERAL:alert, no distress and comfortable SKIN: skin color, texture, turgor are normal,  no rashes or significant lesions EYES: normal, conjunctiva are pink and non-injected, sclera clear OROPHARYNX:no exudate, no erythema and lips, buccal mucosa, and tongue normal  NECK: supple, thyroid normal size, non-tender, without nodularity LYMPH:  no palpable lymphadenopathy in the cervical, axillary or inguinal LUNGS: clear to auscultation and percussion with normal breathing effort HEART: regular rate & rhythm and no murmurs, mild edema in right low extremity ABDOMEN:abdomen soft, distended, non-tender and normal bowel sounds Musculoskeletal:no cyanosis of digits and no clubbing  PSYCH: alert & oriented x 3 with fluent speech NEURO: no focal motor/sensory deficits  LABORATORY DATA:  I have reviewed the data as listed Lab Results  Component Value Date   WBC 10.4 04/09/2022   HGB 9.4 (L) 04/09/2022   HCT 29.2 (L) 04/09/2022   MCV 97.3 04/09/2022   PLT 188 04/09/2022   Recent Labs    07/26/21 1830 07/27/21 0556 04/08/22 0818  NA 140 138 133*  K 4.1 4.3 3.7  CL 100 99 95*  CO2 '28 25 27  '$ GLUCOSE 108* 147* 178*  BUN '10 11 18  '$ CREATININE 0.91 1.06 1.41*  CALCIUM 9.8 9.1 9.4  GFRNONAA >60 >60 56*  PROT  --  6.3*  --   ALBUMIN  --  2.9*  --   AST  --  14*  --   ALT  --  13  --   ALKPHOS  --  50  --   BILITOT  --  1.3*  --     RADIOGRAPHIC STUDIES: I have personally reviewed the radiological images as listed and agreed with the findings in the report. ECHOCARDIOGRAM COMPLETE  Result Date: 04/09/2022    ECHOCARDIOGRAM REPORT   Patient  Name:   Kyle Delacruz [facility] Date of Exam: 04/09/2022 Medical Rec #:  [id number]       Height:       69.0 in Accession #:    [id number]      Weight:       252.6 lb Date of Birth:  02/10/1956       BSA:          2.282 m Patient Age:    72 years        BP:           111/69 mmHg Patient Gender: M               HR:           97 bpm. Exam Location:  Inpatient Procedure: 2D Echo, Cardiac Doppler and Color Doppler Indications:    I26.02 Pulmonary embolus   History:        Patient has prior history of Echocardiogram examinations, most                 recent 07/27/2021. Abnormal ECG, COPD, Signs/Symptoms:Dyspnea and                 Shortness of Breath; Risk Factors:Hypertension, Dyslipidemia,                 Sleep Apnea and Current Smoker.  Sonographer:    Roseanna Rainbow RDCS Referring Phys: [id number] ERIC CHEN  Sonographer Comments: Technically difficult study due to poor echo windows and patient is obese. Image acquisition challenging due to patient body habitus. IMPRESSIONS  1. Left ventricular ejection fraction, by estimation, is 60 to 65%. The left ventricle has normal function. The left ventricle has no regional wall motion abnormalities. Left ventricular diastolic parameters are consistent with Grade I diastolic dysfunction (impaired relaxation).  2. Right ventricular systolic function is normal. The right ventricular size is mildly enlarged. There is mildly elevated pulmonary artery systolic pressure. The estimated right ventricular systolic pressure is 40.3 mmHg.  3. The mitral valve is normal in structure. No evidence of mitral valve regurgitation. No evidence of mitral stenosis.  4. The aortic valve is tricuspid. Aortic valve regurgitation is not visualized. No aortic stenosis is present.  5. Aortic dilatation noted. There is mild dilatation of the ascending aorta, measuring 40 mm.  6. The inferior vena cava is normal in size with greater than 50% respiratory variability, suggesting right atrial pressure of 3 mmHg. FINDINGS  Left Ventricle: Left ventricular ejection fraction, by estimation, is 60 to 65%. The left ventricle has normal function. The left ventricle has no regional wall motion abnormalities. The left ventricular internal cavity size was normal in size. There is  no left ventricular hypertrophy. Left ventricular diastolic parameters are consistent with Grade I diastolic dysfunction (impaired relaxation). Right Ventricle: The right ventricular size is mildly  enlarged. No increase in right ventricular wall thickness. Right ventricular systolic function is normal. There is mildly elevated pulmonary artery systolic pressure. The tricuspid regurgitant velocity is 2.92 m/s, and with an assumed right atrial pressure of 3 mmHg, the estimated right ventricular systolic pressure is 47.4 mmHg. Left Atrium: Left atrial size was normal in size. Right Atrium: Right atrial size was normal in size. Pericardium: There is no evidence of pericardial effusion. Mitral Valve: The mitral valve is normal in structure. No evidence of mitral valve regurgitation. No evidence of mitral valve stenosis. Tricuspid Valve: The tricuspid valve is normal in structure. Tricuspid valve regurgitation is trivial. Aortic Valve: The aortic  valve is tricuspid. Aortic valve regurgitation is not visualized. No aortic stenosis is present. Pulmonic Valve: The pulmonic valve was normal in structure. Pulmonic valve regurgitation is not visualized. Aorta: The aortic root is normal in size and structure and aortic dilatation noted. There is mild dilatation of the ascending aorta, measuring 40 mm. Venous: The inferior vena cava is normal in size with greater than 50% respiratory variability, suggesting right atrial pressure of 3 mmHg. IAS/Shunts: No atrial level shunt detected by color flow Doppler.  LEFT VENTRICLE PLAX 2D LVIDd:         4.40 cm     Diastology LVIDs:         2.50 cm     LV e' medial:    6.64 cm/s LV PW:         1.40 cm     LV E/e' medial:  9.1 LV IVS:        1.10 cm     LV e' lateral:   8.05 cm/s LVOT diam:     2.10 cm     LV E/e' lateral: 7.5 LV SV:         53 LV SV Index:   23 LVOT Area:     3.46 cm  LV Volumes (MOD) LV vol d, MOD A2C: 62.5 ml LV vol d, MOD A4C: 70.5 ml LV vol s, MOD A2C: 20.0 ml LV vol s, MOD A4C: 20.0 ml LV SV MOD A2C:     42.5 ml LV SV MOD A4C:     70.5 ml LV SV MOD BP:      48.4 ml RIGHT VENTRICLE             IVC RV S prime:     13.20 cm/s  IVC diam: 0.90 cm TAPSE (M-mode): 1.6  cm LEFT ATRIUM             Index        RIGHT ATRIUM           Index LA diam:        3.10 cm 1.36 cm/m   RA Area:     10.80 cm LA Vol (A2C):   18.5 ml 8.11 ml/m   RA Volume:   21.70 ml  9.51 ml/m LA Vol (A4C):   27.1 ml 11.88 ml/m LA Biplane Vol: 24.2 ml 10.61 ml/m  AORTIC VALVE LVOT Vmax:   101.45 cm/s LVOT Vmean:  71.550 cm/s LVOT VTI:    0.154 m  AORTA Ao Root diam: 3.50 cm Ao Asc diam:  3.95 cm MITRAL VALVE               TRICUSPID VALVE MV Area (PHT): 4.10 cm    TR Peak grad:   34.1 mmHg MV Decel Time: 185 msec    TR Vmax:        292.00 cm/s MV E velocity: 60.65 cm/s MV A velocity: 62.30 cm/s  SHUNTS MV E/A ratio:  0.97        Systemic VTI:  0.15 m                            Systemic Diam: 2.10 cm Dalton McleanMD Electronically signed by Franki Monte Signature Date/Time: 04/09/2022/2:41:12 PM    Final    VAS [place] LOWER EXTREMITY VENOUS (DVT)  Result Date: 04/09/2022  Lower Venous DVT Study Patient Name:  Kyle Delacruz [facility]  Date of Exam:   04/09/2022 Medical Rec #:  [id number]        Accession #:    [id number] Date of Birth: 08-Mar-1956        Patient Gender: M Patient Age:   89 years Exam Location:  [facility] Procedure:      VAS [place] LOWER EXTREMITY VENOUS (DVT) Referring Phys: RALPH NETTEY --------------------------------------------------------------------------------  Indications: Pulmonary embolism, and Edema. Other Indications: Hx of RLE DVT & PE 07/26/21 post spinal surgery. Continued                    chronic RLE DVT with improvement and reconstituted flow in                    RLEV studies performed on 10/06/21, 12/22/21, and 02/13/22.                    Recent spinal surgery on 04/04/22 now with new PE and                    occlusive RLE DVT. Limitations: Poor ultrasound/tissue interface. Comparison Study: No previous LLEV exams Performing Technologist: Jody Hill RVT, RDMS  Examination Guidelines: A complete evaluation includes B-mode imaging, spectral Doppler, color Doppler, and power  Doppler as needed of all accessible portions of each vessel. Bilateral testing is considered an integral part of a complete examination. Limited examinations for reoccurring indications may be performed as noted. The reflux portion of the exam is performed with the patient in reverse Trendelenburg.  +-----+---------------+---------+-----------+----------+--------------+ RIGHTCompressibilityPhasicitySpontaneityPropertiesThrombus Aging +-----+---------------+---------+-----------+----------+--------------+ CFV  Full           Yes      Yes                                 +-----+---------------+---------+-----------+----------+--------------+   +---------+---------------+---------+-----------+----------+--------------+ LEFT     CompressibilityPhasicitySpontaneityPropertiesThrombus Aging +---------+---------------+---------+-----------+----------+--------------+ CFV      Full           Yes      Yes                                 +---------+---------------+---------+-----------+----------+--------------+ SFJ      Full                                                        +---------+---------------+---------+-----------+----------+--------------+ FV Prox  Full           Yes      Yes                                 +---------+---------------+---------+-----------+----------+--------------+ FV Mid   Full           Yes      Yes                                 +---------+---------------+---------+-----------+----------+--------------+ FV DistalFull           Yes      Yes                                 +---------+---------------+---------+-----------+----------+--------------+  PFV      Full                                                        +---------+---------------+---------+-----------+----------+--------------+ POP      Full           Yes      Yes                                  +---------+---------------+---------+-----------+----------+--------------+ PTV      Full                                                        +---------+---------------+---------+-----------+----------+--------------+ PERO     Full                                                        +---------+---------------+---------+-----------+----------+--------------+     Summary: RIGHT: - No evidence of common femoral vein obstruction.  LEFT: - There is no evidence of deep vein thrombosis in the lower extremity.  - No cystic structure found in the popliteal fossa.  *See table(s) above for measurements and observations. Electronically signed by Monica Martinez MD on 04/09/2022 at 12:41:12 PM.    Final    US Venous Img Lower Unilateral Right  Result Date: 04/08/2022 CLINICAL DATA:  Right lower extremity swelling. EXAM: Right LOWER EXTREMITY VENOUS DOPPLER ULTRASOUND TECHNIQUE: Gray-scale sonography with graded compression, as well as color Doppler and duplex ultrasound were performed to evaluate the lower extremity deep venous systems from the level of the common femoral vein and including the common femoral, femoral, profunda femoral, popliteal and calf veins including the posterior tibial, peroneal and gastrocnemius veins when visible. The superficial great saphenous vein was also interrogated. Spectral Doppler was utilized to evaluate flow at rest and with distal augmentation maneuvers in the common femoral, femoral and popliteal veins. COMPARISON:  July 26, 2021. FINDINGS: Contralateral Common Femoral Vein: Respiratory phasicity is normal and symmetric with the symptomatic side. No evidence of thrombus. Normal compressibility. Common Femoral Vein: No evidence of thrombus. Normal compressibility, respiratory phasicity and response to augmentation. Saphenofemoral Junction: No evidence of thrombus. Normal compressibility and flow on color Doppler imaging. Profunda Femoral Vein: No evidence of  thrombus. Normal compressibility and flow on color Doppler imaging. Femoral Vein: Noncompressible with no flow consistent with occlusive thrombus. Popliteal Vein: Noncompressible with no flow consistent with occlusive thrombus. Calf Veins: No evidence of thrombus. Normal compressibility and flow on color Doppler imaging. Superficial Great Saphenous Vein: No evidence of thrombus. Normal compressibility. Venous Reflux:  None. Other Findings:  None. IMPRESSION: Occlusive deep venous thrombosis is noted in the right femoral and popliteal veins. This was present on the prior exam of July 26, 2021. Electronically Signed   By: Marijo Conception M.D.   On: 04/08/2022 12:22   CT L-SPINE NO CHARGE  Result Date: 04/08/2022 CLINICAL DATA:  66 year old male status post lumbar  spine fusion last week. EXAM: CT LUMBAR SPINE WITH CONTRAST TECHNIQUE: Technique: Multiplanar CT images of the lumbar spine were reconstructed from contemporary CT of the Abdomen and Pelvis. RADIATION DOSE REDUCTION: This exam was performed according to the departmental dose-optimization program which includes automated exposure control, adjustment of the mA and/or kV according to patient size and/or use of iterative reconstruction technique. CONTRAST:  No additional. COMPARISON:  CTA Chest, Abdomen, and Pelvis today are reported separately. Lumbar MRI 05/24/2021. FINDINGS: Segmentation: Normal, the same numbering system used on the MRI this year. Alignment: Lumbar lordosis not significantly changed. No significant scoliosis or spondylolisthesis. Vertebrae: Postoperative details are below. Lower thoracic spine ankylosis through L1-L2 related to flowing endplate osteophytes. Associated T12 costovertebral junction ankylosis. Interbody ankylosis also at L3-L4 and L5-S1 secondary to bulky endplate osteophytes. Visible sacrum and SI joints appear intact. No acute osseous abnormality identified. Paraspinal and other soft tissues: Abdomen and pelvis are  reported separately today. Postoperative lumbar paraspinal soft tissue changes with no adverse features. Disc levels: Lower thoracic through L1-L2 interbody ankylosis. L2-L3: Bilateral pedicle screws and right side interbody implant. Satisfactory hardware appearance. Right side posterior decompression. L3-L4: Bilateral pedicle screws and right side interbody implant. Satisfactory hardware appearance. Pre-existing interbody ankylosis at this level. L4-L5: Bilateral pedicle screws. Right side interbody implant and some bone graft material is somewhat posteriorly situated on the right (series 6, image 34). Involvement of some of the right L4 neural foramen, although some right side posterior element decompression has also occurred at this level. Furthermore, the right L5 pedicle screw partially traverses the right L5 neural foramen as seen on sagittal image 32 and coronal image 58. No other adverse hardware features. L5-S1:  Chronic interbody ankylosis. IMPRESSION: 1. Underlying lower thoracic through L1-L2 ankylosis, and ankylosis also at L3-L4 and L5-S1, due to bulky endplate osteophytes. 2. Superimposed L2-L3 through L4-L5 posterior and interbody fusion hardware placement. The L4-L5 interbody implant is somewhat posteriorly positioned and encroaches on the right L4 neural foramen, while the right L5 pedicle screw partially traverses the right L5 neural foramen. 3. CTA Chest, Abdomen And Pelvis today are reported separately. Electronically Signed   By: Genevie Ann M.D.   On: 04/08/2022 10:13   CT Angio Chest PE W and/or Wo Contrast  Result Date: 04/08/2022 CLINICAL DATA:  High probability of pulmonary embolism. EXAM: CT ANGIOGRAPHY CHEST WITH CONTRAST TECHNIQUE: Multidetector CT imaging of the chest was performed using the standard protocol during bolus administration of intravenous contrast. Multiplanar CT image reconstructions and MIPs were obtained to evaluate the vascular anatomy. RADIATION DOSE REDUCTION: This  exam was performed according to the departmental dose-optimization program which includes automated exposure control, adjustment of the mA and/or kV according to patient size and/or use of iterative reconstruction technique. CONTRAST:  140m OMNIPAQUE IOHEXOL 300 MG/ML  SOLN COMPARISON:  July 26, 2021. FINDINGS: Cardiovascular: Large filling defect is seen in the filling defect of the upper lobe branch of the right pulmonary artery, consistent with acute pulmonary embolus. RV/LV ratio of 1.3 is noted suggesting right heart strain. Mild cardiomegaly is noted. No pericardial effusion. Moderate to severe coronary artery calcifications are noted. Mediastinum/Nodes: No enlarged mediastinal, hilar, or axillary lymph nodes. Thyroid gland, trachea, and esophagus demonstrate no significant findings. Lungs/Pleura: No pneumothorax or pleural effusion is noted. Hypoinflation of the lungs is noted with mild bibasilar subsegmental atelectasis. Musculoskeletal: No chest wall abnormality. No acute or significant osseous findings. Review of the MIP images confirms the above findings. IMPRESSION: Acute pulmonary embolus is  noted in the upper lobe branch of the right pulmonary artery. Positive for acute PE with CT evidence of right heart strain (RV/LV Ratio = 1.3) consistent with at least submassive (intermediate risk) PE. The presence of right heart strain has been associated with an increased risk of morbidity and mortality. Please refer to the "Code PE Focused" order set in EPIC. Critical Value/emergent results were called by telephone at the time of interpretation on 04/08/2022 at 10:07 am to provider [facility] , who verbally acknowledged these results. Moderate to severe coronary calcifications are noted suggesting coronary artery disease. Hypoinflation of the lungs is noted with mild bibasilar subsegmental atelectasis. Aortic Atherosclerosis (ICD10-I70.0). Electronically Signed   By: Marijo Conception M.D.   On: 04/08/2022  10:07   CT ABDOMEN PELVIS W CONTRAST  Result Date: 04/08/2022 CLINICAL DATA:  Abdominal pain.  Bowel obstruction suspected. EXAM: CT ABDOMEN AND PELVIS WITH CONTRAST TECHNIQUE: Multidetector CT imaging of the abdomen and pelvis was performed using the standard protocol following bolus administration of intravenous contrast. RADIATION DOSE REDUCTION: This exam was performed according to the departmental dose-optimization program which includes automated exposure control, adjustment of the mA and/or kV according to patient size and/or use of iterative reconstruction technique. CONTRAST:  149m OMNIPAQUE IOHEXOL 300 MG/ML  SOLN COMPARISON:  10/02/2021 FINDINGS: Lower chest: See report for chest CTA performed at the same time and dictated separately. Hepatobiliary: No suspicious focal abnormality within the liver parenchyma. There is no evidence for gallstones, gallbladder wall thickening, or pericholecystic fluid. No intrahepatic or extrahepatic biliary dilation. Pancreas: No focal mass lesion. No dilatation of the main duct. No intraparenchymal cyst. No peripancreatic edema. Spleen: No splenomegaly. No focal mass lesion. Adrenals/Urinary Tract: No adrenal nodule or mass. Several small nonobstructing stones are seen in the right kidney measuring up to 4 mm. 3 tiny 1-2 mm nonobstructing stones are seen in the left kidney. Tiny interpolar left renal cyst evident. No followup imaging is recommended. No evidence for hydroureter. Bladder obscured by beam hardening artifact from bilateral hip replacement. Stomach/Bowel: Stomach is unremarkable. No gastric wall thickening. No evidence of outlet obstruction. Duodenum is normally positioned as is the ligament of Treitz. No small bowel wall thickening. No small bowel dilatation. The terminal ileum is normal. The appendix is normal. Right and transverse colon is markedly distended with gas and stool. Ascending colon measures up to 9.2 cm diameter. Transverse colon measures  up to 8.1 cm diameter. Colon gradually tapers through the descending and sigmoid segments into the rectum. There is no abrupt or discrete transition zone evident. Fluid is visible in the rectum. No colonic wall thickening or colonic wall pneumatosis. No substantial pericolonic edema or inflammation. Vascular/Lymphatic: There is moderate atherosclerotic calcification of the abdominal aorta without aneurysm. There is no gastrohepatic or hepatoduodenal ligament lymphadenopathy. No retroperitoneal or mesenteric lymphadenopathy. No pelvic sidewall lymphadenopathy. Reproductive: Prostate gland is obscured by beam hardening artifact. Other: Trace fluid is seen in the para colic gutters bilaterally. Musculoskeletal: Status post bilateral hip replacement. Lumbar fusion hardware evident. IMPRESSION: 1. Right and transverse colon is markedly distended with gas and stool. Colon gradually tapers through the descending and sigmoid segments into the rectum. There is no abrupt or discrete transition zone evident. Fluid is visible in the rectum. 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 2. Trace fluid in the para colic gutters bilaterally. 3. Bilateral nonobstructing renal stones. 4. See report for chest CTA performed at the same time and dictated separately. 5.  Aortic Atherosclerosis (  ICD10-I70.0). Electronically Signed   By: Misty Stanley M.D.   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  Chronic lower Vit[place]ou[place] extremity DVT 3.  Acute respite failure with hypoxia 4. Hypertension 5. Hypothyroidism 6. OSA 7. Early stage prostate cancer, on watchful wait    Recommendations: -I personally reviewed his scan images, labs and chart  -He had provoked DVT along with bilateral PE twice, both happened after spinal surgery.  Given the extensiveness of thrombosis, and his chronic right lower extremity DVT, I recommend  lifelong anticoagulation.  I discussed option of oral anticoagulation, such as Eliquis, Coumadin (he previously had it after hip surgery) and lovenox injection.  I do not think his chronic right lower extremity DVT was Eliquis failure, so I think it is okay to use DOA such as Eliquis. Pt prefers DOA, and it will be fine to transition from hepatin drip to Eliquis directly with 7 days of loading dose  -He has untreated prostate cancer, has been watched by his urologist.  His last PSA was slightly elevated 5 to 6 months ago, however repeat PSA tomorrow.  I do not think he has recurrent thrombosis is related to the prostate cancer, but wanted to rule out cancer progression. -I do not think hypercoagulopathy workup is absolutely necessary, since he needs lifelong anticoagulation -I will see him as needed in the future.  He can follow-up with primary care physician.  We did discuss management of anticoagulation around surgery, and he will call me if needed in the future.  All questions were answered. The patient knows to call the clinic with any problems, questions or concerns.      Truitt Merle, MD 04/09/2022 5:40 PM

## 2022-04-09 NOTE — Hospital Course (Signed)
Kyle Delacruz is a 66 y.o. male with a history of DVT/PE, cervical spine surgery, lumbar spine surgery, hypertension, hypothyroidism, prostate cancer, OSA. Patient presented secondary to tachycardia and shortness of breath and found to have evidence of acute saddle PE with possible heart strain. Heparin IV started. RLE venous duplex confirms chronic DVT. Admission complicated by constipation, abdominal distention and abdominal pain. GI consulted.

## 2022-04-09 NOTE — Progress Notes (Signed)
PROGRESS NOTE    Kyle Delacruz  ZOX:[id number] DOB: Aug 17, 1955 DOA: 04/08/2022 PCP: Prince Solian, MD   Brief Narrative: Kyle Delacruz is a 66 y.o. male with a history of DVT/PE, cervical spine surgery, lumbar spine surgery, hypertension, hypothyroidism, prostate cancer, OSA. Patient presented secondary to tachycardia and shortness of breath and found to have evidence of acute saddle PE with possible heart strain. Heparin IV started. RLE venous duplex confirms chronic DVT. Admission complicated by constipation as well.    Assessment and Plan: * Acute pulmonary embolism ([place]) Recurrent. Patient with recent DVT/PE in 07/2021 and completed 6 months of Eliquis. CTA chest this admission confirms bilateral acute PE. Right LE venous duplex is significant for chronic DVT. Patient started on heparin IV for treatment. Transthoracic Echocardiogram ordered and pending. -Continue Heparin IV. Will transition to outpatient regimen pending Transthoracic Echocardiogram results -Hematology consult regarding choice of anticoagulation -Check LLE venous duplex to rule out acute DVT  Leg DVT (deep venous thromboembolism), chronic, right [facility]) Radiology report suggests right LE DVT is chronic. Discussed with Dr. Carlis Abbott, vascular surgery, who agrees that DVT is likely chronic.  Acute respiratory failure with hypoxia (HCC) Secondary to acute PE. Patient requiring 2 L/min of supplemental oxygen. -Wean to room air as able -Ambulatory pulse ox prior to discharge  Constipation due to opioid therapy Patient started on Movantik this admission. -Continue Movantik -SMOG enema  Obstructive sleep apnea (adult) (pediatric) Chronic.  Gastro-esophageal reflux disease without esophagitis Stable.  Essential hypertension Not on antihypertensive drug regimen. Stable.  Hypothyroidism Stable.  Continue Synthroid 50 mcg daily.    DVT prophylaxis: Heparin IV Code Status:   Code Status: Full Code Family  Communication: Wife at bedside Disposition Plan: Discharge home likely in 1-2 days pending ability to transition to outpatient anticoagulation regimen, wean to room air if able, improvement of constipation   Consultants:  Hematology  Procedures:  Lower extremity venous duplex  Antimicrobials: None    Subjective: Patient reports no chest pain today. He does have some epigastric pain. No bowel movements. No flatus.  Objective: BP 113/71   Pulse (!) 102   Temp 97.9 F (36.6 C) (Axillary)   Resp 13   Ht 5' 9.02" (1.753 m)   Wt 114.6 kg   SpO2 (!) 89%   BMI 37.29 kg/m   Examination:  General exam: Appears calm and comfortable Respiratory system: Clear to auscultation. Respiratory effort normal. Cardiovascular system: S1 & S2 heard, RRR. No murmurs, rubs, gallops or clicks. Gastrointestinal system: Abdomen is distended, soft and non-tender. Normal/slightly hyperactive bowel sounds heard. Central nervous system: Alert and oriented. No focal neurological deficits. Musculoskeletal: No edema. No calf tenderness Skin: No cyanosis. No rashes Psychiatry: Judgement and insight appear normal. Mood & affect appropriate.    Data Reviewed: I have personally reviewed following labs and imaging studies  CBC Lab Results  Component Value Date   WBC 10.4 04/09/2022   RBC 3.00 (L) 04/09/2022   HGB 9.4 (L) 04/09/2022   HCT 29.2 (L) 04/09/2022   MCV 97.3 04/09/2022   MCH 31.3 04/09/2022   PLT 188 04/09/2022   MCHC 32.2 04/09/2022   RDW 13.6 04/09/2022   LYMPHSABS 1.7 04/08/2022   MONOABS 1.3 (H) 04/08/2022   EOSABS 0.1 04/08/2022   BASOSABS 0.0 81/19/1478     Last metabolic panel Lab Results  Component Value Date   NA 133 (L) 04/08/2022   K 3.7 04/08/2022   CL 95 (L) 04/08/2022   CO2 27 04/08/2022  BUN 18 04/08/2022   CREATININE 1.41 (H) 04/08/2022   GLUCOSE 178 (H) 04/08/2022   GFRNONAA 56 (L) 04/08/2022   GFRAA >60 08/19/2019   CALCIUM 9.4 04/08/2022   PROT 6.3 (L)  07/27/2021   ALBUMIN 2.9 (L) 07/27/2021   BILITOT 1.3 (H) 07/27/2021   ALKPHOS 50 07/27/2021   AST 14 (L) 07/27/2021   ALT 13 07/27/2021   ANIONGAP 11 04/08/2022    GFR: Estimated Creatinine Clearance: 66.1 mL/min (A) (by C-G formula based on SCr of 1.41 mg/dL (H)).  Recent Results (from the past 240 hour(s))  Resp Panel by RT-PCR (Flu A&B, Covid) Anterior Nasal Swab     Status: None   Collection Time: 04/08/22  8:18 AM   Specimen: Anterior Nasal Swab  Result Value Ref Range Status   SARS Coronavirus 2 by RT PCR NEGATIVE NEGATIVE Final    Comment: (NOTE) SARS-CoV-2 target nucleic acids are NOT DETECTED.  The SARS-CoV-2 RNA is generally detectable in upper respiratory specimens during the acute phase of infection. The lowest concentration of SARS-CoV-2 viral copies this assay can detect is 138 copies/mL. A negative result does not preclude SARS-Cov-2 infection and should not be used as the sole basis for treatment or other patient management decisions. A negative result may occur with  improper specimen collection/handling, submission of specimen other than nasopharyngeal swab, presence of viral mutation(s) within the areas targeted by this assay, and inadequate number of viral copies(<138 copies/mL). A negative result must be combined with clinical observations, patient history, and epidemiological information. The expected result is Negative.  Fact Sheet for Patients:  EntrepreneurPulse.com.au  Fact Sheet for Healthcare Providers:  IncredibleEmployment.be  This test is no t yet approved or cleared by the [place] FDA and  has been authorized for detection and/or diagnosis of SARS-CoV-2 by FDA under an Emergency Use Authorization (EUA). This EUA will remain  in effect (meaning this test can be used) for the duration of the COVID-19 declaration under Section 564(b)(1) of the Act, 21 U.S.C.section 360bbb-3(b)(1), unless the  authorization is terminated  or revoked sooner.       Influenza A by PCR NEGATIVE NEGATIVE Final   Influenza B by PCR NEGATIVE NEGATIVE Final    Comment: (NOTE) The Xpert Xpress SARS-CoV-2/FLU/RSV plus assay is intended as an aid in the diagnosis of influenza from Nasopharyngeal swab specimens and should not be used as a sole basis for treatment. Nasal washings and aspirates are unacceptable for Xpert Xpress SARS-CoV-2/FLU/RSV testing.  Fact Sheet for Patients: EntrepreneurPulse.com.au  Fact Sheet for Healthcare Providers: IncredibleEmployment.be  This test is not yet approved or cleared by the [place] FDA and has been authorized for detection and/or diagnosis of SARS-CoV-2 by FDA under an Emergency Use Authorization (EUA). This EUA will remain in effect (meaning this test can be used) for the duration of the COVID-19 declaration under Section 564(b)(1) of the Act, 21 U.S.C. section 360bbb-3(b)(1), unless the authorization is terminated or revoked.  Performed at [facility], [address], [place], [place] [address]   MRSA Next Gen by PCR, Nasal     Status: None   Collection Time: 04/08/22  6:26 PM   Specimen: Nasal Mucosa; Nasal Swab  Result Value Ref Range Status   MRSA by PCR Next Gen NOT DETECTED NOT DETECTED Final    Comment: (NOTE) The GeneXpert MRSA Assay (FDA approved for NASAL specimens only), is one component of a comprehensive MRSA colonization surveillance program. It is not intended to diagnose MRSA infection nor  to guide or monitor treatment for MRSA infections. Test performance is not FDA approved in patients less than 33 years old. Performed at [facility], [place] [address], [place], [place] [address]       Radiology Studies: US Venous Img Lower Unilateral Right  Result Date: 04/08/2022 CLINICAL DATA:  Right lower extremity swelling. EXAM: Right LOWER EXTREMITY VENOUS  DOPPLER ULTRASOUND TECHNIQUE: Gray-scale sonography with graded compression, as well as color Doppler and duplex ultrasound were performed to evaluate the lower extremity deep venous systems from the level of the common femoral vein and including the common femoral, femoral, profunda femoral, popliteal and calf veins including the posterior tibial, peroneal and gastrocnemius veins when visible. The superficial great saphenous vein was also interrogated. Spectral Doppler was utilized to evaluate flow at rest and with distal augmentation maneuvers in the common femoral, femoral and popliteal veins. COMPARISON:  July 26, 2021. FINDINGS: Contralateral Common Femoral Vein: Respiratory phasicity is normal and symmetric with the symptomatic side. No evidence of thrombus. Normal compressibility. Common Femoral Vein: No evidence of thrombus. Normal compressibility, respiratory phasicity and response to augmentation. Saphenofemoral Junction: No evidence of thrombus. Normal compressibility and flow on color Doppler imaging. Profunda Femoral Vein: No evidence of thrombus. Normal compressibility and flow on color Doppler imaging. Femoral Vein: Noncompressible with no flow consistent with occlusive thrombus. Popliteal Vein: Noncompressible with no flow consistent with occlusive thrombus. Calf Veins: No evidence of thrombus. Normal compressibility and flow on color Doppler imaging. Superficial Great Saphenous Vein: No evidence of thrombus. Normal compressibility. Venous Reflux:  None. Other Findings:  None. IMPRESSION: Occlusive deep venous thrombosis is noted in the right femoral and popliteal veins. This was present on the prior exam of July 26, 2021. Electronically Signed   By: Marijo Conception M.D.   On: 04/08/2022 12:22   CT L-SPINE NO CHARGE  Result Date: 04/08/2022 CLINICAL DATA:  66 year old male status post lumbar spine fusion last week. EXAM: CT LUMBAR SPINE WITH CONTRAST TECHNIQUE: Technique: Multiplanar CT images  of the lumbar spine were reconstructed from contemporary CT of the Abdomen and Pelvis. RADIATION DOSE REDUCTION: This exam was performed according to the departmental dose-optimization program which includes automated exposure control, adjustment of the mA and/or kV according to patient size and/or use of iterative reconstruction technique. CONTRAST:  No additional. COMPARISON:  CTA Chest, Abdomen, and Pelvis today are reported separately. Lumbar MRI 05/24/2021. FINDINGS: Segmentation: Normal, the same numbering system used on the MRI this year. Alignment: Lumbar lordosis not significantly changed. No significant scoliosis or spondylolisthesis. Vertebrae: Postoperative details are below. Lower thoracic spine ankylosis through L1-L2 related to flowing endplate osteophytes. Associated T12 costovertebral junction ankylosis. Interbody ankylosis also at L3-L4 and L5-S1 secondary to bulky endplate osteophytes. Visible sacrum and SI joints appear intact. No acute osseous abnormality identified. Paraspinal and other soft tissues: Abdomen and pelvis are reported separately today. Postoperative lumbar paraspinal soft tissue changes with no adverse features. Disc levels: Lower thoracic through L1-L2 interbody ankylosis. L2-L3: Bilateral pedicle screws and right side interbody implant. Satisfactory hardware appearance. Right side posterior decompression. L3-L4: Bilateral pedicle screws and right side interbody implant. Satisfactory hardware appearance. Pre-existing interbody ankylosis at this level. L4-L5: Bilateral pedicle screws. Right side interbody implant and some bone graft material is somewhat posteriorly situated on the right (series 6, image 34). Involvement of some of the right L4 neural foramen, although some right side posterior element decompression has also occurred at this level. Furthermore, the right L5 pedicle screw partially traverses  the right L5 neural foramen as seen on sagittal image 32 and coronal image  58. No other adverse hardware features. L5-S1:  Chronic interbody ankylosis. IMPRESSION: 1. Underlying lower thoracic through L1-L2 ankylosis, and ankylosis also at L3-L4 and L5-S1, due to bulky endplate osteophytes. 2. Superimposed L2-L3 through L4-L5 posterior and interbody fusion hardware placement. The L4-L5 interbody implant is somewhat posteriorly positioned and encroaches on the right L4 neural foramen, while the right L5 pedicle screw partially traverses the right L5 neural foramen. 3. CTA Chest, Abdomen And Pelvis today are reported separately. Electronically Signed   By: Genevie Ann M.D.   On: 04/08/2022 10:13   CT Angio Chest PE W and/or Wo Contrast  Result Date: 04/08/2022 CLINICAL DATA:  High probability of pulmonary embolism. EXAM: CT ANGIOGRAPHY CHEST WITH CONTRAST TECHNIQUE: Multidetector CT imaging of the chest was performed using the standard protocol during bolus administration of intravenous contrast. Multiplanar CT image reconstructions and MIPs were obtained to evaluate the vascular anatomy. RADIATION DOSE REDUCTION: This exam was performed according to the departmental dose-optimization program which includes automated exposure control, adjustment of the mA and/or kV according to patient size and/or use of iterative reconstruction technique. CONTRAST:  144m OMNIPAQUE IOHEXOL 300 MG/ML  SOLN COMPARISON:  July 26, 2021. FINDINGS: Cardiovascular: Large filling defect is seen in the filling defect of the upper lobe branch of the right pulmonary artery, consistent with acute pulmonary embolus. RV/LV ratio of 1.3 is noted suggesting right heart strain. Mild cardiomegaly is noted. No pericardial effusion. Moderate to severe coronary artery calcifications are noted. Mediastinum/Nodes: No enlarged mediastinal, hilar, or axillary lymph nodes. Thyroid gland, trachea, and esophagus demonstrate no significant findings. Lungs/Pleura: No pneumothorax or pleural effusion is noted. Hypoinflation of the  lungs is noted with mild bibasilar subsegmental atelectasis. Musculoskeletal: No chest wall abnormality. No acute or significant osseous findings. Review of the MIP images confirms the above findings. IMPRESSION: Acute pulmonary embolus is noted in the upper lobe branch of the right pulmonary artery. Positive for acute PE with CT evidence of right heart strain (RV/LV Ratio = 1.3) consistent with at least submassive (intermediate risk) PE. The presence of right heart strain has been associated with an increased risk of morbidity and mortality. Please refer to the "Code PE Focused" order set in EPIC. Critical Value/emergent results were called by telephone at the time of interpretation on 04/08/2022 at 10:07 am [facility] verbally acknowledged these results. Moderate to severe coronary calcifications are noted suggesting coronary artery disease. Hypoinflation of the lungs is noted with mild bibasilar subsegmental atelectasis. Aortic Atherosclerosis (ICD10-I70.0). Electronically Signed   By: JMarijo ConceptionM.D.   On: 04/08/2022 10:07   CT ABDOMEN PELVIS W CONTRAST  Result Date: 04/08/2022 CLINICAL DATA:  Abdominal pain.  Bowel obstruction suspected. EXAM: CT ABDOMEN AND PELVIS WITH CONTRAST TECHNIQUE: Multidetector CT imaging of the abdomen and pelvis was performed using the standard protocol following bolus administration of intravenous contrast. RADIATION DOSE REDUCTION: This exam was performed according to the departmental dose-optimization program which includes automated exposure control, adjustment of the mA and/or kV according to patient size and/or use of iterative reconstruction technique. CONTRAST:  1015mOMNIPAQUE IOHEXOL 300 MG/ML  SOLN COMPARISON:  10/02/2021 FINDINGS: Lower chest: See report for chest CTA performed at the same time and dictated separately. Hepatobiliary: No suspicious focal abnormality within the liver parenchyma. There is no evidence for gallstones, gallbladder  wall thickening, or pericholecystic fluid. No intrahepatic or extrahepatic biliary dilation. Pancreas:  No focal mass lesion. No dilatation of the main duct. No intraparenchymal cyst. No peripancreatic edema. Spleen: No splenomegaly. No focal mass lesion. Adrenals/Urinary Tract: No adrenal nodule or mass. Several small nonobstructing stones are seen in the right kidney measuring up to 4 mm. 3 tiny 1-2 mm nonobstructing stones are seen in the left kidney. Tiny interpolar left renal cyst evident. No followup imaging is recommended. No evidence for hydroureter. Bladder obscured by beam hardening artifact from bilateral hip replacement. Stomach/Bowel: Stomach is unremarkable. No gastric wall thickening. No evidence of outlet obstruction. Duodenum is normally positioned as is the ligament of Treitz. No small bowel wall thickening. No small bowel dilatation. The terminal ileum is normal. The appendix is normal. Right and transverse colon is markedly distended with gas and stool. Ascending colon measures up to 9.2 cm diameter. Transverse colon measures up to 8.1 cm diameter. Colon gradually tapers through the descending and sigmoid segments into the rectum. There is no abrupt or discrete transition zone evident. Fluid is visible in the rectum. No colonic wall thickening or colonic wall pneumatosis. No substantial pericolonic edema or inflammation. Vascular/Lymphatic: There is moderate atherosclerotic calcification of the abdominal aorta without aneurysm. There is no gastrohepatic or hepatoduodenal ligament lymphadenopathy. No retroperitoneal or mesenteric lymphadenopathy. No pelvic sidewall lymphadenopathy. Reproductive: Prostate gland is obscured by beam hardening artifact. Other: Trace fluid is seen in the para colic gutters bilaterally. Musculoskeletal: Status post bilateral hip replacement. Lumbar fusion hardware evident. IMPRESSION: 1. Right and transverse colon is markedly distended with gas and stool. Colon gradually  tapers through the descending and sigmoid segments into the rectum. There is no abrupt or discrete transition zone evident. Fluid is visible in the rectum. 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 2. Trace fluid in the para colic gutters bilaterally. 3. Bilateral nonobstructing renal stones. 4. See report for chest CTA performed at the same time and dictated separately. 5.  Aortic Atherosclerosis (ICD10-I70.0). Electronically Signed   By: Misty Stanley M.D.   On: 04/08/2022 10:06      LOS: 1 day    Cordelia Poche, MD [facility] 04/09/2022, 9:31 AM   If 7PM-7AM, please contact night-coverage [email or web address]

## 2022-04-09 NOTE — Progress Notes (Signed)
LLE venous duplex has been completed.   Results can be found under chart review under CV PROC. 04/09/2022 11:37 AM Vanessa Alesi RVT, RDMS

## 2022-04-09 NOTE — Progress Notes (Signed)
ANTICOAGULATION CONSULT NOTE - Follow Up Consult  Pharmacy Consult for Heparin Indication: pulmonary embolus and DVT  Allergies  Allergen Reactions   Bee Venom     Other reaction(s): Unknown   Lisinopril     Other reaction(s): Unknown   Metoclopramide Other (See Comments)    Other reaction(s): shaking too bad "Sweat like crazy"    Penicillins Hives    Did it involve swelling of the face/tongue/throat, SOB, or low BP? Yes Did it involve sudden or severe rash/hives, skin peeling, or any reaction on the inside of your mouth or nose? Yes Did you need to seek medical attention at a hospital or doctor's office? No When did it last happen?  childhood      If all above answers are "NO", may proceed with cephalosporin use.     Shellfish Allergy Swelling    Patient Measurements: Height: 5' 9.02" (175.3 cm) Weight: 106.6 kg (235 lb) IBW/kg (Calculated) : 70.74 Heparin Dosing Weight: 94 kg  Vital Signs: Temp: 98 F (36.7 C) (11/26 2300) Temp Source: Oral (11/26 2300) BP: 124/66 (11/27 0000) Pulse Rate: 100 (11/27 0000)  Labs: Recent Labs    04/08/22 0818 04/08/22 1021 04/08/22 1132 04/08/22 1314 04/08/22 1859 04/09/22 0140 04/09/22 0234  HGB 10.4*  --   --   --   --   --  9.4*  HCT 31.5*  --   --   --   --   --  29.2*  PLT 184  --   --   --   --   --  188  APTT  --  29  --   --   --   --   --   LABPROT  --  13.9  --   --   --   --   --   INR  --  1.1  --   --   --   --   --   HEPARINUNFRC  --   --  >1.10*  --  0.57 0.65  --   CREATININE 1.41*  --   --   --   --   --   --   TROPONINIHS  --  8  --  11  --   --   --      Estimated Creatinine Clearance: 63.7 mL/min (A) (by C-G formula based on SCr of 1.41 mg/dL (H)).   Medications:  Infusions:   heparin 1,500 Units/hr (04/09/22 0001)    Assessment: 89 yoM presented on 11/26 for cough, SOB, abdominal pain.  PMH significant for DVT/PE after previous cervical spine surgery 07/17/21 and he completed a course of  apixaban in August.  He had a recent L2-L5 fusion on 04/04/22 and had a postop plan to be on Lovenox '40mg'$  (filled 04/04/22 x7 days).  Today, CTa is positive for acute PE with right heart strain and postop ileus.  Dopplers are positive for DVT in RLE.   04/09/22 Heparin level 0.65, therapeutic on heparin at 1500 units/hr  Hgb low 9.4, Pltc wnl No bleeding or complications reported.   Goal of Therapy:  Heparin level 0.3-0.7 units/ml Monitor platelets by anticoagulation protocol: Yes   Plan:  Continue heparin IV infusion at 1500 units/hr Daily heparin level and CBC   Leone Haven, PharmD 04/09/2022 3:02 AM

## 2022-04-09 NOTE — Progress Notes (Signed)
  Echocardiogram 2D Echocardiogram has been performed.  Bobbye Charleston 04/09/2022, 2:03 PM

## 2022-04-10 ENCOUNTER — Inpatient Hospital Stay (HOSPITAL_COMMUNITY): Payer: Medicare Other

## 2022-04-10 ENCOUNTER — Inpatient Hospital Stay (HOSPITAL_COMMUNITY): Payer: Medicare Other | Admitting: Certified Registered Nurse Anesthetist

## 2022-04-10 ENCOUNTER — Encounter (HOSPITAL_COMMUNITY)
Admission: EM | Disposition: A | Discharge status: Home Health | Payer: Self-pay | Source: Home / Self Care | Attending: Internal Medicine

## 2022-04-10 ENCOUNTER — Other Ambulatory Visit: Payer: Self-pay

## 2022-04-10 DIAGNOSIS — K567 Ileus, unspecified: Secondary | ICD-10-CM | POA: Diagnosis not present

## 2022-04-10 DIAGNOSIS — I2609 Other pulmonary embolism with acute cor pulmonale: Secondary | ICD-10-CM

## 2022-04-10 DIAGNOSIS — M4726 Other spondylosis with radiculopathy, lumbar region: Secondary | ICD-10-CM | POA: Diagnosis not present

## 2022-04-10 DIAGNOSIS — Z4789 Encounter for other orthopedic aftercare: Secondary | ICD-10-CM | POA: Diagnosis not present

## 2022-04-10 DIAGNOSIS — Z87891 Personal history of nicotine dependence: Secondary | ICD-10-CM

## 2022-04-10 DIAGNOSIS — M48062 Spinal stenosis, lumbar region with neurogenic claudication: Secondary | ICD-10-CM | POA: Diagnosis not present

## 2022-04-10 DIAGNOSIS — R579 Shock, unspecified: Secondary | ICD-10-CM

## 2022-04-10 DIAGNOSIS — K661 Hemoperitoneum: Secondary | ICD-10-CM

## 2022-04-10 DIAGNOSIS — J9601 Acute respiratory failure with hypoxia: Secondary | ICD-10-CM | POA: Diagnosis not present

## 2022-04-10 DIAGNOSIS — M5431 Sciatica, right side: Secondary | ICD-10-CM | POA: Diagnosis not present

## 2022-04-10 DIAGNOSIS — J449 Chronic obstructive pulmonary disease, unspecified: Secondary | ICD-10-CM | POA: Diagnosis not present

## 2022-04-10 DIAGNOSIS — I2602 Saddle embolus of pulmonary artery with acute cor pulmonale: Secondary | ICD-10-CM | POA: Diagnosis not present

## 2022-04-10 DIAGNOSIS — R140 Abdominal distension (gaseous): Secondary | ICD-10-CM

## 2022-04-10 DIAGNOSIS — I10 Essential (primary) hypertension: Secondary | ICD-10-CM | POA: Diagnosis not present

## 2022-04-10 DIAGNOSIS — D638 Anemia in other chronic diseases classified elsewhere: Secondary | ICD-10-CM

## 2022-04-10 DIAGNOSIS — I82501 Chronic embolism and thrombosis of unspecified deep veins of right lower extremity: Secondary | ICD-10-CM | POA: Diagnosis not present

## 2022-04-10 DIAGNOSIS — E039 Hypothyroidism, unspecified: Secondary | ICD-10-CM

## 2022-04-10 DIAGNOSIS — D649 Anemia, unspecified: Secondary | ICD-10-CM | POA: Insufficient documentation

## 2022-04-10 HISTORY — PX: LAPAROTOMY: SHX154

## 2022-04-10 LAB — BASIC METABOLIC PANEL
Anion gap: 13 (ref 5–15)
BUN: 14 mg/dL (ref 8–23)
CO2: 26 mmol/L (ref 22–32)
Calcium: 8.5 mg/dL — ABNORMAL LOW (ref 8.9–10.3)
Chloride: 96 mmol/L — ABNORMAL LOW (ref 98–111)
Creatinine, Ser: 1.49 mg/dL — ABNORMAL HIGH (ref 0.61–1.24)
GFR, Estimated: 52 mL/min — ABNORMAL LOW (ref >60)
Glucose, Bld: 199 mg/dL — ABNORMAL HIGH (ref 70–99)
Potassium: 3.6 mmol/L (ref 3.5–5.1)
Sodium: 135 mmol/L (ref 135–145)

## 2022-04-10 LAB — CBC
HCT: 26.6 % — ABNORMAL LOW (ref 39.0–52.0)
Hemoglobin: 8.5 g/dL — ABNORMAL LOW (ref 13.0–17.0)
MCH: 30.5 pg (ref 26.0–34.0)
MCHC: 32 g/dL (ref 30.0–36.0)
MCV: 95.3 fL (ref 80.0–100.0)
Platelets: 187 10*3/uL (ref 150–400)
RBC: 2.79 MIL/uL — ABNORMAL LOW (ref 4.22–5.81)
RDW: 13.2 % (ref 11.5–15.5)
WBC: 8.9 10*3/uL (ref 4.0–10.5)
nRBC: 0 % (ref 0.0–0.2)

## 2022-04-10 LAB — HEMOGLOBIN AND HEMATOCRIT, BLOOD
HCT: 27.4 % — ABNORMAL LOW (ref 39.0–52.0)
HCT: 19.9 % — ABNORMAL LOW (ref 39.0–52.0)
Hemoglobin: 8.8 g/dL — ABNORMAL LOW (ref 13.0–17.0)
Hemoglobin: 6.4 g/dL — CL (ref 13.0–17.0)

## 2022-04-10 LAB — POCT I-STAT 7, (LYTES, BLD GAS, ICA,H+H)
Acid-base deficit: 9 mmol/L — ABNORMAL HIGH (ref 0.0–2.0)
Acid-base deficit: 4 mmol/L — ABNORMAL HIGH (ref 0.0–2.0)
Bicarbonate: 17.9 mmol/L — ABNORMAL LOW (ref 20.0–28.0)
Bicarbonate: 20.5 mmol/L (ref 20.0–28.0)
Calcium, Ion: 1.1 mmol/L — ABNORMAL LOW (ref 1.15–1.40)
Calcium, Ion: 1.02 mmol/L — ABNORMAL LOW (ref 1.15–1.40)
HCT: 37 % — ABNORMAL LOW (ref 39.0–52.0)
HCT: 26 % — ABNORMAL LOW (ref 39.0–52.0)
Hemoglobin: 12.6 g/dL — ABNORMAL LOW (ref 13.0–17.0)
Hemoglobin: 8.8 g/dL — ABNORMAL LOW (ref 13.0–17.0)
O2 Saturation: 100 %
O2 Saturation: 98 %
Patient temperature: 37.2
Potassium: 3.9 mmol/L (ref 3.5–5.1)
Potassium: 4 mmol/L (ref 3.5–5.1)
Sodium: 133 mmol/L — ABNORMAL LOW (ref 135–145)
Sodium: 133 mmol/L — ABNORMAL LOW (ref 135–145)
TCO2: 19 mmol/L — ABNORMAL LOW (ref 22–32)
TCO2: 22 mmol/L (ref 22–32)
pCO2 arterial: 41.2 mmHg (ref 32–48)
pCO2 arterial: 34.6 mmHg (ref 32–48)
pH, Arterial: 7.247 — ABNORMAL LOW (ref 7.35–7.45)
pH, Arterial: 7.38 (ref 7.35–7.45)
pO2, Arterial: 451 mmHg — ABNORMAL HIGH (ref 83–108)
pO2, Arterial: 98 mmHg (ref 83–108)

## 2022-04-10 LAB — BLOOD GAS, ARTERIAL
Acid-Base Excess: 0.3 mmol/L (ref 0.0–2.0)
Bicarbonate: 23.5 mmol/L (ref 20.0–28.0)
O2 Saturation: 100 %
Patient temperature: 37.4
pCO2 arterial: 34 mmHg (ref 32–48)
pH, Arterial: 7.45 (ref 7.35–7.45)
pO2, Arterial: 329 mmHg — ABNORMAL HIGH (ref 83–108)

## 2022-04-10 LAB — ECHOCARDIOGRAM LIMITED
Height: 70 in
Weight: 4003.55 oz

## 2022-04-10 LAB — PSA: Prostatic Specific Antigen: 5.07 ng/mL — ABNORMAL HIGH (ref 0.00–4.00)

## 2022-04-10 LAB — HEPARIN LEVEL (UNFRACTIONATED)
Heparin Unfractionated: 0.73 IU/mL — ABNORMAL HIGH (ref 0.30–0.70)
Heparin Unfractionated: 0.59 IU/mL (ref 0.30–0.70)

## 2022-04-10 LAB — TROPONIN I (HIGH SENSITIVITY)
Troponin I (High Sensitivity): 8 ng/L (ref <18)
Troponin I (High Sensitivity): 8 ng/L (ref <18)

## 2022-04-10 LAB — GLUCOSE, CAPILLARY
Glucose-Capillary: 150 mg/dL — ABNORMAL HIGH (ref 70–99)
Glucose-Capillary: 201 mg/dL — ABNORMAL HIGH (ref 70–99)

## 2022-04-10 LAB — LACTIC ACID, PLASMA
Lactic Acid, Venous: 4.4 mmol/L (ref 0.5–1.9)
Lactic Acid, Venous: 2.9 mmol/L (ref 0.5–1.9)

## 2022-04-10 LAB — PREPARE RBC (CROSSMATCH)

## 2022-04-10 SURGERY — LAPAROTOMY, EXPLORATORY
Anesthesia: General

## 2022-04-10 MED ORDER — PANTOPRAZOLE SODIUM 40 MG IV SOLR
40.0000 mg | Freq: Every day | INTRAVENOUS | Status: DC
  Administered 2022-04-11 – 2022-04-19 (×9): 40 mg via INTRAVENOUS
  Filled 2022-04-10 (×9): qty 10

## 2022-04-10 MED ORDER — CEFAZOLIN SODIUM-DEXTROSE 2-4 GM/100ML-% IV SOLN
INTRAVENOUS | Status: AC
  Filled 2022-04-10: qty 100

## 2022-04-10 MED ORDER — SODIUM CHLORIDE 0.9% IV SOLUTION: Freq: Once | INTRAVENOUS | Status: AC

## 2022-04-10 MED ORDER — ETOMIDATE 2 MG/ML IV SOLN
INTRAVENOUS | Status: AC
  Administered 2022-04-10: 20 mg
  Filled 2022-04-10: qty 20

## 2022-04-10 MED ORDER — CEFAZOLIN SODIUM-DEXTROSE 2-3 GM-%(50ML) IV SOLR
INTRAVENOUS | Status: DC | PRN
  Administered 2022-04-10: 2 g via INTRAVENOUS

## 2022-04-10 MED ORDER — POLYETHYLENE GLYCOL 3350 17 G PO PACK: 17.0000 g | PACK | Freq: Every day | ORAL | Status: DC

## 2022-04-10 MED ORDER — PHENYLEPHRINE 80 MCG/ML (10ML) SYRINGE FOR IV PUSH (FOR BLOOD PRESSURE SUPPORT)
PREFILLED_SYRINGE | INTRAVENOUS | Status: AC
  Administered 2022-04-10: 40 ug
  Filled 2022-04-10: qty 10

## 2022-04-10 MED ORDER — VASOPRESSIN 20 UNIT/ML IV SOLN
0.0000 [IU]/min | INTRAVENOUS | Status: DC
  Administered 2022-04-11 (×2): 0.2 [IU]/min via INTRAVENOUS
  Filled 2022-04-10 (×3): qty 2.5

## 2022-04-10 MED ORDER — DEXMEDETOMIDINE HCL IN NACL 200 MCG/50ML IV SOLN
INTRAVENOUS | Status: DC | PRN
  Administered 2022-04-10: .4 ug/kg/h via INTRAVENOUS

## 2022-04-10 MED ORDER — MIDAZOLAM HCL 2 MG/2ML IJ SOLN
INTRAMUSCULAR | Status: AC
  Filled 2022-04-10: qty 2

## 2022-04-10 MED ORDER — FENTANYL CITRATE (PF) 100 MCG/2ML IJ SOLN
INTRAMUSCULAR | Status: AC
  Administered 2022-04-10: 100 ug via INTRAVENOUS
  Filled 2022-04-10: qty 2

## 2022-04-10 MED ORDER — DEXMEDETOMIDINE HCL IN NACL 200 MCG/50ML IV SOLN
INTRAVENOUS | Status: AC
  Administered 2022-04-11: 0.4 ug/kg/h via INTRAVENOUS
  Filled 2022-04-10: qty 50

## 2022-04-10 MED ORDER — SUCCINYLCHOLINE CHLORIDE 200 MG/10ML IV SOSY
PREFILLED_SYRINGE | INTRAVENOUS | Status: AC
  Filled 2022-04-10: qty 10

## 2022-04-10 MED ORDER — LIDOCAINE HCL (PF) 2 % IJ SOLN
INTRAMUSCULAR | Status: AC
  Filled 2022-04-10: qty 5

## 2022-04-10 MED ORDER — FENTANYL CITRATE (PF) 100 MCG/2ML IJ SOLN
INTRAMUSCULAR | Status: AC
  Filled 2022-04-10: qty 2

## 2022-04-10 MED ORDER — ROCURONIUM BROMIDE 50 MG/5ML IV SOLN: 80.0000 mg | Freq: Once | INTRAVENOUS | Status: AC

## 2022-04-10 MED ORDER — NOREPINEPHRINE 4 MG/250ML-% IV SOLN
2.0000 ug/min | INTRAVENOUS | Status: DC
  Administered 2022-04-10: 10 ug/min via INTRAVENOUS
  Administered 2022-04-11: 9 ug/min via INTRAVENOUS
  Administered 2022-04-11: 10 ug/min via INTRAVENOUS
  Filled 2022-04-10 (×3): qty 250

## 2022-04-10 MED ORDER — MIDAZOLAM HCL 2 MG/2ML IJ SOLN
INTRAMUSCULAR | Status: DC | PRN
  Administered 2022-04-10: 2 mg via INTRAVENOUS

## 2022-04-10 MED ORDER — DOCUSATE SODIUM 50 MG/5ML PO LIQD: 100.0000 mg | Freq: Two times a day (BID) | ORAL | Status: DC

## 2022-04-10 MED ORDER — ROCURONIUM BROMIDE 10 MG/ML (PF) SYRINGE
PREFILLED_SYRINGE | INTRAVENOUS | Status: AC
  Filled 2022-04-10: qty 10

## 2022-04-10 MED ORDER — FENTANYL CITRATE PF 50 MCG/ML IJ SOSY: 50.0000 ug | PREFILLED_SYRINGE | Freq: Once | INTRAMUSCULAR | Status: DC

## 2022-04-10 MED ORDER — SORBITOL 70 % SOLN
960.0000 mL | TOPICAL_OIL | Freq: Once | ORAL | Status: AC
  Administered 2022-04-10: 960 mL via RECTAL
  Filled 2022-04-10: qty 240

## 2022-04-10 MED ORDER — SODIUM CHLORIDE 0.9 % IV SOLN
250.0000 mL | INTRAVENOUS | Status: DC
  Administered 2022-04-11: 250 mL via INTRAVENOUS

## 2022-04-10 MED ORDER — METRONIDAZOLE 500 MG/100ML IV SOLN
INTRAVENOUS | Status: DC | PRN
  Administered 2022-04-10: 500 mg via INTRAVENOUS

## 2022-04-10 MED ORDER — ROCURONIUM BROMIDE 10 MG/ML (PF) SYRINGE
PREFILLED_SYRINGE | INTRAVENOUS | Status: DC | PRN
  Administered 2022-04-10: 100 mg via INTRAVENOUS
  Administered 2022-04-10: 50 mg via INTRAVENOUS

## 2022-04-10 MED ORDER — DEXMEDETOMIDINE HCL IN NACL 200 MCG/50ML IV SOLN
0.0000 ug/kg/h | INTRAVENOUS | Status: DC
  Administered 2022-04-10 (×2): 0.4 ug/kg/h via INTRAVENOUS
  Administered 2022-04-11: 0.6 ug/kg/h via INTRAVENOUS
  Administered 2022-04-11: 0.8 ug/kg/h via INTRAVENOUS
  Filled 2022-04-10: qty 100
  Filled 2022-04-10 (×3): qty 50

## 2022-04-10 MED ORDER — LACTATED RINGERS IV BOLUS
2000.0000 mL | Freq: Once | INTRAVENOUS | Status: AC
  Administered 2022-04-10: 2000 mL via INTRAVENOUS

## 2022-04-10 MED ORDER — MIDAZOLAM HCL 2 MG/2ML IJ SOLN
INTRAMUSCULAR | Status: AC
  Administered 2022-04-10: 2 mg via INTRAVENOUS
  Filled 2022-04-10: qty 2

## 2022-04-10 MED ORDER — PHENYLEPHRINE 80 MCG/ML (10ML) SYRINGE FOR IV PUSH (FOR BLOOD PRESSURE SUPPORT)
PREFILLED_SYRINGE | INTRAVENOUS | Status: AC
  Administered 2022-04-10: 800 ug
  Filled 2022-04-10: qty 10

## 2022-04-10 MED ORDER — FENTANYL CITRATE (PF) 100 MCG/2ML IJ SOLN
INTRAMUSCULAR | Status: DC | PRN
  Administered 2022-04-10: 100 ug via INTRAVENOUS

## 2022-04-10 MED ORDER — ETOMIDATE 2 MG/ML IV SOLN: 20.0000 mg | Freq: Once | INTRAVENOUS | Status: AC

## 2022-04-10 MED ORDER — SODIUM CHLORIDE 0.9 % IV BOLUS
1000.0000 mL | Freq: Once | INTRAVENOUS | Status: AC
  Administered 2022-04-10: 1000 mL via INTRAVENOUS

## 2022-04-10 MED ORDER — ETOMIDATE 2 MG/ML IV SOLN
INTRAVENOUS | Status: AC
  Filled 2022-04-10: qty 10

## 2022-04-10 MED ORDER — SODIUM CHLORIDE 0.9 % IV SOLN: INTRAVENOUS | Status: DC | PRN

## 2022-04-10 MED ORDER — METRONIDAZOLE 500 MG/100ML IV SOLN
INTRAVENOUS | Status: AC
  Filled 2022-04-10: qty 100

## 2022-04-10 MED ORDER — FENTANYL 2500MCG IN NS 250ML (10MCG/ML) PREMIX INFUSION
50.0000 ug/h | INTRAVENOUS | Status: DC
  Administered 2022-04-10: 50 ug/h via INTRAVENOUS
  Administered 2022-04-11: 75 ug/h via INTRAVENOUS
  Filled 2022-04-10 (×2): qty 250

## 2022-04-10 MED ORDER — ROCURONIUM BROMIDE 10 MG/ML (PF) SYRINGE
PREFILLED_SYRINGE | INTRAVENOUS | Status: AC
  Administered 2022-04-10: 80 mg via INTRAVENOUS
  Filled 2022-04-10: qty 10

## 2022-04-10 MED ORDER — TRANEXAMIC ACID-NACL 1000-0.7 MG/100ML-% IV SOLN
1000.0000 mg | Freq: Once | INTRAVENOUS | Status: AC
  Administered 2022-04-10: 1000 mg via INTRAVENOUS
  Filled 2022-04-10: qty 100

## 2022-04-10 MED ORDER — SODIUM CHLORIDE (PF) 0.9 % IJ SOLN
INTRAMUSCULAR | Status: AC
  Filled 2022-04-10: qty 10

## 2022-04-10 MED ORDER — ROCURONIUM BROMIDE 10 MG/ML (PF) SYRINGE
PREFILLED_SYRINGE | INTRAVENOUS | Status: AC
  Filled 2022-04-10: qty 20

## 2022-04-10 MED ORDER — FENTANYL BOLUS VIA INFUSION
50.0000 ug | INTRAVENOUS | Status: DC | PRN
  Administered 2022-04-10: 100 ug via INTRAVENOUS
  Administered 2022-04-10 (×2): 50 ug via INTRAVENOUS
  Administered 2022-04-11: 75 ug via INTRAVENOUS
  Administered 2022-04-11 (×2): 50 ug via INTRAVENOUS

## 2022-04-10 MED ORDER — PHENYLEPHRINE HCL-NACL 20-0.9 MG/250ML-% IV SOLN
0.0000 ug/min | INTRAVENOUS | Status: DC
  Filled 2022-04-10: qty 5000

## 2022-04-10 MED ORDER — TRANEXAMIC ACID-NACL 1000-0.7 MG/100ML-% IV SOLN
INTRAVENOUS | Status: AC
  Filled 2022-04-10: qty 100

## 2022-04-10 MED ORDER — VASOPRESSIN 20 UNITS/100 ML INFUSION FOR SHOCK
INTRAVENOUS | Status: AC
  Filled 2022-04-10: qty 100

## 2022-04-10 MED ORDER — FENTANYL CITRATE (PF) 100 MCG/2ML IJ SOLN: 100.0000 ug | Freq: Once | INTRAMUSCULAR | Status: AC

## 2022-04-10 MED ORDER — LACTATED RINGERS IV SOLN: INTRAVENOUS | Status: DC | PRN

## 2022-04-10 MED ORDER — MIDAZOLAM HCL 2 MG/2ML IJ SOLN: 2.0000 mg | Freq: Once | INTRAMUSCULAR | Status: AC

## 2022-04-10 MED ORDER — FAMOTIDINE 20 MG PO TABS: 20.0000 mg | ORAL_TABLET | Freq: Two times a day (BID) | ORAL | Status: DC

## 2022-04-10 MED ORDER — FENTANYL CITRATE (PF) 250 MCG/5ML IJ SOLN
INTRAMUSCULAR | Status: AC
  Filled 2022-04-10: qty 5

## 2022-04-10 MED ORDER — PROPOFOL 10 MG/ML IV BOLUS
INTRAVENOUS | Status: AC
  Filled 2022-04-10: qty 20

## 2022-04-10 SURGICAL SUPPLY — 44 items
APL PRP STRL LF DISP 70% ISPRP (MISCELLANEOUS) ×1
BAG COUNTER SPONGE SURGICOUNT (BAG) IMPLANT
BAG SPNG CNTER NS LX DISP (BAG)
CHLORAPREP W/TINT 26 (MISCELLANEOUS) IMPLANT
COVER MAYO STAND STRL (DRAPES) ×1 IMPLANT
COVER SURGICAL LIGHT HANDLE (MISCELLANEOUS) ×1 IMPLANT
DRAIN CHANNEL 19F RND (DRAIN) IMPLANT
DRAPE LAPAROSCOPIC ABDOMINAL (DRAPES) ×1 IMPLANT
DRSG OPSITE POSTOP 4X10 (GAUZE/BANDAGES/DRESSINGS) IMPLANT
DRSG OPSITE POSTOP 4X6 (GAUZE/BANDAGES/DRESSINGS) IMPLANT
DRSG OPSITE POSTOP 4X8 (GAUZE/BANDAGES/DRESSINGS) IMPLANT
ELECT REM PT RETURN 15FT ADLT (MISCELLANEOUS) ×1 IMPLANT
EVACUATOR SILICONE 100CC (DRAIN) IMPLANT
GLOVE BIO SURGEON STRL SZ 6 (GLOVE) ×2 IMPLANT
GLOVE INDICATOR 6.5 STRL GRN (GLOVE) ×2 IMPLANT
GOWN STRL REUS W/ TWL XL LVL3 (GOWN DISPOSABLE) ×1 IMPLANT
GOWN STRL REUS W/TWL XL LVL3 (GOWN DISPOSABLE) ×2
HANDLE SUCTION POOLE (INSTRUMENTS) IMPLANT
HEMOSTAT ARISTA ABSORB 3G PWDR (HEMOSTASIS) IMPLANT
KIT TURNOVER KIT A (KITS) IMPLANT
LIGASURE IMPACT 36 18CM CVD LR (INSTRUMENTS) IMPLANT
NS IRRIG 1000ML POUR BTL (IV SOLUTION) IMPLANT
PACK GENERAL/GYN (CUSTOM PROCEDURE TRAY) ×1 IMPLANT
RELOAD PROXIMATE 75MM BLUE (ENDOMECHANICALS) ×2 IMPLANT
RELOAD STAPLE 75 3.8 BLU REG (ENDOMECHANICALS) IMPLANT
SPONGE T-LAP 18X18 ~~LOC~~+RFID (SPONGE) IMPLANT
STAPLER PROXIMATE 75MM BLUE (STAPLE) IMPLANT
STAPLER VISISTAT 35W (STAPLE) IMPLANT
SUCTION POOLE HANDLE (INSTRUMENTS) ×3
SUT ETHILON 3 0 PS 1 (SUTURE) IMPLANT
SUT MNCRL AB 4-0 PS2 18 (SUTURE) IMPLANT
SUT NOVA T20/GS 25 (SUTURE) IMPLANT
SUT SILK 2 0 (SUTURE)
SUT SILK 2 0 SH CR/8 (SUTURE) ×1 IMPLANT
SUT SILK 2-0 18XBRD TIE 12 (SUTURE) ×1 IMPLANT
SUT SILK 3 0 (SUTURE)
SUT SILK 3 0 SH CR/8 (SUTURE) ×1 IMPLANT
SUT SILK 3-0 18XBRD TIE 12 (SUTURE) ×1 IMPLANT
SUT VIC AB 2-0 CT1 18 (SUTURE) IMPLANT
SUT VIC AB 3-0 SH 18 (SUTURE) IMPLANT
TOWEL OR 17X26 10 PK STRL BLUE (TOWEL DISPOSABLE) IMPLANT
TOWEL OR NON WOVEN STRL DISP B (DISPOSABLE) ×1 IMPLANT
TRAY FOLEY MTR SLVR 14FR STAT (SET/KITS/TRAYS/PACK) ×1 IMPLANT
TRAY FOLEY MTR SLVR 16FR STAT (SET/KITS/TRAYS/PACK) ×1 IMPLANT

## 2022-04-10 NOTE — Assessment & Plan Note (Signed)
Unclear etiology for chronic anemia. Baseline is around 12. Patient with a hemoglobin of 10.4 on admission with downward drift. Complicated by Heparin use for acute PE, but no obvious bleeding identified. -CBC in AM

## 2022-04-10 NOTE — Progress Notes (Signed)
PCCM Interval Progress Note  Spoke with patient's wife, Solmon Ice, to update her on patient's clinical status.  Updated patient's wife on CT results, which shows large pericolonic hematoma in the RUQ, likely in the anterior pararenal space, tracking into the peritoneum along the R hemiliver. Also concern for extraluminal gastric tube placement.  Explained that in the setting of active hematoma, Jarold continues to hemodynamically decompensate, requiring increased amounts of blood pressure support and blood products. 2U PRBCs and 2U FFP have been ordered and A-line placed for BP monitoring. Levophed was increased to 5mg and bicarb x 2/Ca/Epi x 1 administered. General Surgery was STAT consulted for evaluation.  FSolmon Icewas made aware of these updates and had no further questions at this time. I confirmed patient's FULL CODE status and he/his wife's wishes for full resuscitative efforts (including CPR/MV).  SLestine Mount PA-C [facility] 04/10/22 7:02 PM  Please see [email or web address] for pager details.  From 7A-7P if no response, please call [phone] After hours, please call ELink [phone]

## 2022-04-10 NOTE — Progress Notes (Signed)
Patient with new BP of 73/47. Patient examined at bedside. Slightly pale appearing. Non-diaphoretic, alert, oriented, following commands. Patient reports some dizziness with moving in the bed. He also reports continued abdominal and some back pain as well. Pain is described as sharp. Patient continues to ooze stool while in bed secondary to most recent enema.  BP 113/69   Pulse (!) 114   Temp 97.9 F (36.6 C) (Oral)   Resp 18   Ht 5' 9.02" (1.753 m)   Wt 113.5 kg   SpO2 97%   BMI 36.93 kg/m   General exam: Appears calm and comfortable. Respiratory system: Respiratory effort normal. Cardiovascular system: S1 & S2 heard, tachycardia with normal rhythm. 1+, rapid radial pulses bilaterally Gastrointestinal system: Abdomen is distended, soft and mildly tender. Central nervous system: Alert and oriented. No focal neurological deficits. Follows all commands. Musculoskeletal: back surgical site without noted ecchymosis or tenderness.  Skin: No cyanosis. No rashes Psychiatry: Judgement and insight appear normal. Mood & affect appropriate.   A/P:  Hypotension Afebrile. No infectious symptoms. Patient received dilaudid which possibly could have contributed. Concern is also diagnosis of submassive PE with previous mildly dilated RV. Previous EKG significant for sinus tachycardia. -1L NS bolus and recheck BP -Troponin, lactic acid, BMP -PCCM consult  Plan discussed with patient, wife and brother at bedside.  Cordelia Poche, MD [facility] 04/10/2022, 2:35 PM

## 2022-04-10 NOTE — Progress Notes (Signed)
[facility] Progress Note Patient Name: Kyle Delacruz DOB: 1955/07/10 MRN: [id number]   Date of Service  04/10/2022  HPI/Events of Note  Pt with hemorrhagic shock, secondary to large retroperitoneal bleed, currently on massive transfusion protocol.  Levophed at 70mg/min, he remains tachycardic  (140s) and BP remains borderline low. On 3rd of 6 units pRBC, and he is doe to receive 6 units FFP and 1 unit plts.  He has also received 3 liters in boluses thus far.   eICU Interventions  Will start vasopressin, phenylephrine as second and third pressor. Titrate to maintain MAP >65.  Will monitor respiratory status closely as he it at risk of developing TACO.  Given continued significant hemorrhagic shock, poor overall prognosis, will give one time dose of tranexamic acid.         [name] 04/10/2022, 7:43 PM

## 2022-04-10 NOTE — Progress Notes (Signed)
At approximately 1627 patient began to take agonal breaths over ventilator. BP undetectable at this time. Patient having non-sustained v-tach; [place] Crane, PA at bedside. At [facility] Transfusion Protocol was called by Quillian Quince Aveleen Nevers,CCMD. Verbal order by Ina Homes, CCMD to give 1 amp of bicarb and 1 gram of calcium. Bicarb given at 1830 and calcium given at [place]. CCM and this RN continue to be at bedside with patient.

## 2022-04-10 NOTE — Progress Notes (Addendum)
04/10/2022 Intra-abdominal hematoma  Question NGT extraluminal?  Hemodynamically decompensating  Massive transfusion  Bicarb/calcium  CCS consult  Question ex lap vs. repeat CT with oral contrast if not going directly to OR  Erskine Emery MD PCCM

## 2022-04-10 NOTE — Progress Notes (Signed)
Notified Lab that ABG being sent down for analysis.

## 2022-04-10 NOTE — Consult Note (Addendum)
Consultation  Referring Provider: TRH/ [place] Primary Care Physician:  Prince Solian, MD Primary Gastroenterologist:  Dr.Gessner  Reason for Consultation:   Colonic Ileus  HPI: Kyle Delacruz is a 66 y.o. male ,, known to Dr. Carlean Purl, who last had colonoscopy in August 2021 done for a positive fit test, he had 2 polyps removed largest 10 mm from the sigmoid colon and was noted to have multiple sigmoid diverticuli. Patient had undergone a cervical laminectomy in March 2023 and developed a right lower extremity DVT and PE postoperatively.  He was treated for 6 months with anticoagulation. He then underwent lumbar decompression/fusion at [facility] about 10 days ago, and then was admitted here on 04/08/2022 with complaints of abdominal pain and nausea.  Per admitting notes had not had any significant bowel movements since his surgery . He underwent work-up in the ER with CT angiography which showed a right upper lobe pulmonary embolus with evidence for right heart strain. CT of the abdomen pelvis was also done showing several small nonobstructing stones in the right kidney, the right and transverse colon were markedly dilated to 9.2 cm in the right colon and transverse today 8.1cm,: Gradually tapered with no evidence of transition and there was no colonic wall thickening. He has been using oral and IV narcotics this admission, he had been given an enema earlier today with some results. KUB today 1 view supine showed grossly stable colonic distention measuring up to 12 cm. We were asked to see for help with colonic ileus.  Unfortunately around 2:30 p.m. today he was found hypotensive 73/47, pale, alert and oriented with increased O2 needs, had continued to complain of abdominal pain and back pain described as sharp and he related to critical care that he had a "searing" pain after the enema today. He has since required intubation.  Troponin 8 Lactate 4.4 Sodium 135/potassium 3.6/BUN  14/creatinine 1.49 Hemoglobin earlier today 8.8/hematocrit 27.4 down from 10.4 on 04/08/2022  He is to get stat chest x-ray, KUB and CT abdomen and pelvis.  Patient unable to offer any history, nursing reports that abdomen appears to be more tense and distended than earlier this afternoon.  Remains on IV heparin  Past Medical History:  Diagnosis Date   Allergy    Arthritis    osteo   CTS (carpal tunnel syndrome)    DJD (degenerative joint disease)    DM2 (diabetes mellitus, type 2) (HCC)    DVT (deep venous thrombosis) (HCC)    GERD (gastroesophageal reflux disease)    Gout    History of adenomatous polyps of colon 12/29/2019   History of nuclear stress test    Myoview 11/17: EF 54, no ST changes, hypertensive blood pressure response, no ischemia, low risk   Hyperlipidemia    Hypothyroidism    Nicotine addiction    OSA (obstructive sleep apnea)    Prostatitis    Reactive airway disease    Sarcocystosis    Sinus arrhythmia    Sleep apnea    cpap- not wearing   Tinea corporis     Past Surgical History:  Procedure Laterality Date   BILATERAL KNEE ARTHROSCOPY     right- 1997; left 2001   COLONOSCOPY  multiple last 2013   KNEE SURGERY Right 2014   torn R medial meniscus- arthroscopy done   PARATHYROIDECTOMY  2005   ROTATOR CUFF REPAIR  2004   right   TONSILLECTOMY     TOTAL HIP ARTHROPLASTY Left 2009   TOTAL  HIP ARTHROPLASTY Right 2006   UPPER GASTROINTESTINAL ENDOSCOPY      Prior to Admission medications   Medication Sig Start Date End Date Taking? Authorizing Provider  albuterol (VENTOLIN HFA) 108 (90 Base) MCG/ACT inhaler Inhale 2 puffs into the lungs every 6 (six) hours as needed for wheezing or shortness of breath. 03/07/21  Yes [provider]  allopurinol (ZYLOPRIM) 300 MG tablet Take 300 mg by mouth at bedtime. 06/30/11  Yes [provider]  CRESTOR 20 MG tablet Take 20 mg by mouth daily.  06/30/11  Yes [provider]   cyclobenzaprine (FLEXERIL) 10 MG tablet Take 10 mg by mouth 3 (three) times daily as needed for muscle spasms. 07/18/21  Yes [provider]  enoxaparin (LOVENOX) 40 MG/0.4ML injection Inject 40 mg into the skin daily. 04/04/22  Yes [provider]  fluticasone (FLONASE) 50 MCG/ACT nasal spray INSTILL 2 SPRAYS IN EACH NOSTRIL DAILY 12/13/21  Yes [provider]  HYDROcodone-acetaminophen (NORCO/VICODIN) 5-325 MG tablet Take 1 tablet by mouth 2 (two) times daily as needed for moderate pain or severe pain. 02/05/22  Yes [provider]  levothyroxine (SYNTHROID, LEVOTHROID) 50 MCG tablet Take 50 mcg by mouth daily. 02/18/15  Yes [provider]  omeprazole (PRILOSEC) 40 MG capsule Take 40 mg by mouth 2 (two) times daily. 01/31/21  Yes [provider]  oxyCODONE-acetaminophen (PERCOCET) 10-325 MG tablet Take 1 tablet by mouth every 4 (four) hours as needed for pain. 04/05/22 05/05/22 Yes [provider]  Probiotic Product (ALIGN) 4 MG CAPS Take 1 capsule by mouth daily.   Yes [provider]    Current Facility-Administered Medications  Medication Dose Route Frequency Provider Last Rate Last Admin   etomidate (AMIDATE) 2 MG/ML injection            fentaNYL (SUBLIMAZE) 100 MCG/2ML injection            midazolam (VERSED) 2 MG/2ML injection            phenylephrine 80 mcg/10 mL injection            rocuronium bromide 100 MG/10ML SOSY            0.9 %  sodium chloride infusion  250 mL Intravenous Continuous Nevada Crane M, PA-C       acetaminophen (TYLENOL) tablet 650 mg  650 mg Oral Q6H PRN Kristopher Oppenheim, DO       Or   acetaminophen (TYLENOL) suppository 650 mg  650 mg Rectal Q6H PRN Kristopher Oppenheim, DO       Chlorhexidine Gluconate Cloth 2 % PADS 6 each  6 each Topical Daily Kristopher Oppenheim, DO   6 each at 04/10/22 1130   heparin ADULT infusion 100 units/mL (25000 units/281m)  1,400 Units/hr Intravenous Continuous JAngela Adam RPH 14  mL/hr at 04/10/22 1225 1,400 Units/hr at 04/10/22 1225   HYDROmorphone (DILAUDID) injection 0.5-1 mg  0.5-1 mg Intravenous Q2H PRN CKristopher Oppenheim DO   1 mg at 04/10/22 1502   levothyroxine (SYNTHROID) tablet 50 mcg  50 mcg Oral Daily CKristopher Oppenheim DO   50 mcg at 04/10/22 0551   norepinephrine (LEVOPHED) '4mg'$  in 2527m(0.016 mg/mL) premix infusion  2-10 mcg/min Intravenous Titrated ReNevada Crane, PA-C       ondansetron (Z[facility]t 4 mg  4 mg Oral Q6H PRN ChKristopher OppenheimDO       Or   ondansetron (ZMet[facility] LLCinjection 4 mg  4 mg Intravenous Q6H PRN ChKristopher OppenheimDO  Oral care mouth rinse  15 mL Mouth Rinse PRN Kristopher Oppenheim, DO       oxyCODONE (Oxy IR/ROXICODONE) immediate release tablet 10 mg  10 mg Oral Q4H PRN Kristopher Oppenheim, DO   10 mg at 04/09/22 2005   pantoprazole (PROTONIX) EC tablet 40 mg  40 mg Oral BID Kristopher Oppenheim, DO   40 mg at 04/10/22 1054   rosuvastatin (CRESTOR) tablet 20 mg  20 mg Oral Daily Kristopher Oppenheim, DO   20 mg at 04/10/22 1054    Allergies as of 04/08/2022 - Review Complete 04/08/2022  Allergen Reaction Noted   Bee venom  07/25/2021   Lisinopril  07/25/2021   Metoclopramide Other (See Comments) 03/22/2021   Penicillins Hives 07/16/2011   Shellfish allergy Swelling 02/21/2015    Family History  Problem Relation Age of Onset   Stroke Mother    Diabetes Father    Heart disease Father        sp CABG   Diabetes Paternal Grandmother    Colon cancer Neg Hx    Stomach cancer Neg Hx    Esophageal cancer Neg Hx    Rectal cancer Neg Hx    Prostate cancer Neg Hx     Social History   Socioeconomic History   Marital status: Married    Spouse name: Not on file   Number of children: 0   Years of education: Not on file   Highest education level: Not on file  Occupational History   Occupation: retired Therapist, sports  Tobacco Use   Smoking status: Former    Packs/day: 0.50    Types: Cigarettes   Smokeless tobacco: Never   Tobacco comments:    Quit March 2020  Vaping Use   Vaping  Use: Never used  Substance and Sexual Activity   Alcohol use: Yes    Alcohol/week: 21.0 standard drinks of alcohol    Types: 21 Cans of beer per week    Comment: 2 beers per day   Drug use: No   Sexual activity: Not on file  Other Topics Concern   Not on file  Social History Narrative   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   Single   No children   4 years in [employer], [place]   Former smoker 1 alcoholic beverage daily no other tobacco or drug use   Social Determinants of Radio broadcast assistant Strain: Not on file  Food Insecurity: No Food Insecurity (04/09/2022)   Hunger Vital Sign    Worried About Running Out of Food in the Last Year: Never true    [place] in the Last Year: Never true  Transportation Needs: No Transportation Needs (04/09/2022)   PRAPARE - Hydrologist (Medical): No    Lack of Transportation (Non-Medical): No  Physical Activity: Not on file  Stress: Not on file  Social Connections: Not on file  Intimate Partner Violence: Not At Risk (04/09/2022)   Humiliation, Afraid, Rape, and Kick questionnaire    Fear of Current or Ex-Partner: No    Emotionally Abused: No    Physically Abused: No    Sexually Abused: No    Review of Systems: Pertinent positive and negative review of systems were noted in the above HPI section.  All other review of systems was otherwise negative.   Physical Exam: Vital signs in last 24 hours: Temp:  [97.9 F (36.6 C)-98.3 F (36.8 C)] 97.9 F (36.6 C) (  11/28 1215) Pulse Rate:  [87-143] 114 (11/28 1215) Resp:  [11-28] 18 (11/28 1215) BP: (113-165)/(68-137) 113/69 (11/28 1200) SpO2:  [88 %-100 %] 97 % (11/28 1215) Weight:  [113.5 kg] 113.5 kg (11/28 0400) Last BM Date : 04/09/22 General:   Sedated, intubated well-developed, older African-American male , OG in place Head:  Normocephalic and atraumatic. Eyes:  Sclera clear, no icterus.   Conjunctiva  pale Ears:  Normal auditory acuity. Nose:  No deformity, discharge,  or lesions. Mouth:  No deformity or lesions.  mucosa dry Neck:  Supple; no masses or thyromegaly. Lungs: Coarse breath sounds bilaterally  heart: tachy 130's\regular rate and rhythm; no murmurs, clicks, rubs,  or gallops. Abdomen: Very distended, tense tympanitic unable to appreciate tenderness ,bowel sounds quiet Rectal: Not done Msk:  Symmetrical without gross deformities. . Pulses:  Normal pulses noted. Extremities:  Without clubbing or edema. Neurologic: Intubated and sedated Skin:  Intact without significant lesions or rashes..   Intake/Output from previous day: 11/27 0701 - 11/28 0700 In: 372.6 [I.V.:372.6] Out: 550 [Urine:550] Intake/Output this shift: Total I/O In: 69.9 [I.V.:69.9] Out: -   Lab Results: Recent Labs    04/08/22 0818 04/09/22 0234 04/10/22 0240 04/10/22 1227  WBC 12.1* 10.4 8.9  --   HGB 10.4* 9.4* 8.5* 8.8*  HCT 31.5* 29.2* 26.6* 27.4*  PLT 184 188 187  --    BMET Recent Labs    04/08/22 0818 04/10/22 1430  NA 133* 135  K 3.7 3.6  CL 95* 96*  CO2 27 26  GLUCOSE 178* 199*  BUN 18 14  CREATININE 1.41* 1.49*  CALCIUM 9.4 8.5*   LFT No results for input(s): "PROT", "ALBUMIN", "AST", "ALT", "ALKPHOS", "BILITOT", "BILIDIR", "IBILI" in the last 72 hours. PT/INR Recent Labs    04/08/22 1021  LABPROT 13.9  INR 1.1   Hepatitis Panel No results for input(s): "HEPBSAG", "HCVAB", "HEPAIGM", "HEPBIGM" in the last 72 hours.    IMPRESSION:  #65 66 year old male admitted 04/08/2022 with complaints of abdominal pain, obstipation, shortness of breath He had undergone lumbar decompression/laminectomy about a week prior to admission had not had any bowel movement since  And on admission to have a right upper lobe pulmonary embolus with right heart strain-started on IV heparin Also has a chronic right lower extremity DVT He had been hypoxic requiring 2 L nasal O2  CT imaging  on admission of the abdomen showed a distended right and transverse colon to approximately 9 cm, no evidence of obstruction and no dilation of the small bowel Findings consistent with colonic ileus Patient had been on narcotics at home for pain control postoperatively  Smog enema yesterday, started on Movantik Enema this morning Patient developed increased abdominal pain after the enema Abdominal films earlier today 1 view supine with approximate 12 cm colon/transverse  #2 acute hypoxic respiratory failure, new hypotension, tachycardia this afternoon has required intubation  Elevated lactate at 4.5  Increasing abdominal distention noted per nursing  Exam with very distended tight abdomen quiet worrisome for bowel perforation .    PLAN: NG tube placed Patient to have stat CT abdomen and pelvis Currently remains on heparin   will follow-up on stat CT, expect will need surgical consult   Amy Esterwood PA-C 04/10/2022, 3:31 PM     Attending physician's note   I have taken reviewed the chart/CT and examined the patient. I performed a substantive portion of this encounter, including complete performance of at least one of the key components, in conjunction with  the APP. I agree with the Advanced Practitioner's note, impression and recommendations.   Hemoperitoneum with shock/intubated  Post op PE on heparin Assoc ?bowel peroforation/gastric perforation ?related to ischemic bowel.   Recommend stat Sx consultation Supportive care. No role of any endoscopic intervention.   Carmell Austria, MD [facility] [phone]

## 2022-04-10 NOTE — Progress Notes (Signed)
Arterial Catheter Insertion Procedure Note  Kyle Delacruz  [id number]  1956-01-21  Date:04/10/22  Time:6:41 PM    Provider Performing: Candee Furbish    Procedure: Insertion of Arterial Line [phone]) with US guidance ([id number])   Indication(s) Blood pressure monitoring and/or need for frequent ABGs  Consent Unable to obtain consent due to emergent nature of procedure.  Anesthesia None   Time Out Verified patient identification, verified procedure, site/side was marked, verified correct patient position, special equipment/implants available, medications/allergies/relevant history reviewed, required imaging and test results available.   Sterile Technique Maximal sterile technique was not able to be achieved due to emergent nature of procedure.   Procedure Description Area of catheter insertion was cleaned with chlorhexidine and draped in sterile fashion. Without real-time ultrasound guidance an arterial catheter was placed into the left femoral artery.  Appropriate arterial tracings confirmed on monitor.     Complications/Tolerance None; patient tolerated the procedure well.   EBL Minimal   Specimen(s) None

## 2022-04-10 NOTE — Procedures (Signed)
Intubation Procedure Note  Kyle Delacruz  [id number]  08/16/1955  Date:04/10/22  Time:4:44 PM   Provider Performing:Maeli Spacek C Tamala Julian    Procedure: Intubation (32202)  Indication(s) Respiratory Failure  Consent Unable to obtain consent due to emergent nature of procedure.   Anesthesia Etomidate, Versed, Fentanyl, and Rocuronium   Time Out Verified patient identification, verified procedure, site/side was marked, verified correct patient position, special equipment/implants available, medications/allergies/relevant history reviewed, required imaging and test results available.   Sterile Technique Usual hand hygeine, masks, and gloves were used   Procedure Description Patient positioned in bed supine.  Sedation given as noted above.  Patient was intubated with endotracheal tube using Glidescope.  View was Grade 1 full glottis .  Number of attempts was 1.  Colorimetric CO2 detector was consistent with tracheal placement.   Complications/Tolerance None; patient tolerated the procedure well. Chest X-ray is ordered to verify placement.   EBL Minimal   Specimen(s) None

## 2022-04-10 NOTE — Op Note (Signed)
Patient: Kyle Delacruz (1956/01/30, [id number])  Date of Surgery: 04/10/22  Preoperative Diagnosis: Hemoperitoneum, Colonic ileus   Postoperative Diagnosis: Hemoperitoneum, Colonic ileus   Surgical Procedure: EXPLORATORY LAPAROTOMY, TOTAL COLECTOMY, END ILEOSTOMY: 49000 (CPT)   Operative Team Members:  Surgeon(s) and Role:    * Brentlee Delage, Nickola Major, MD - Primary   Anesthesiologist: Roderic Palau, MD CRNA: Raenette Rover, CRNA   Anesthesia: General   Fluids:  Total I/O In: 3972.6 [I.V.:1000; Blood:2872.6; IV Piggyback:100] Out: 2725 [Urine:225; QIWLN:9892]  Complications: None  Drains:  none   Specimen:  ID Type Source Tests Collected by Time Destination  1 : colon Tissue PATH Other SURGICAL PATHOLOGY Gaylyn Berish, Nickola Major, MD 04/10/2022 2154      Disposition:  ICU - intubated and critically ill.  Plan of Care: Continue inpatient care    Indications for Procedure:  Kyle Delacruz is an 66 y.o. male who is here for shortness of breath, tachycardia, and the constipation after lumbar spine surgery.   On 04/04/22, he underwent lumbar spine surgery by Dr. Sherlyn Lick at [facility].  EBL was noted to be 750 mL in the operative note.  He was prescribed 81 mg aspirin and Lovenox at discharge, but was told to wait 4 days prior to starting Lovenox.  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  He was discharged on Thanksgiving, 04/05/2022.  [facility]  He complained of tachycardia, shortness of breath, and constipation.  He was diagnosed with a pulmonary embolism on CT, and started on heparin.  His heparin was supra-therapeutic and then dropped into a therapeutic range.  Today his blood pressure dropped he was transferred to the ICU.  He was intubated and a CT scan was completed.  He was  started on Levophed.  His hemoglobin was noted to drop from 10 to 6 over the last few days.  The CT scan demonstrated hemoperitoneum so surgery consult was placed.  I wondered if the ileus distended the colon to the point of tearing an attachment to the liver, or a vessel on the colon itself causing this bleeding.  Certainly the heparin predisposed him to a bleeding issue.  I discussed this difficult case with my partner Dr. TGrandville Siloswho is on-call at the trauma center and reviewed the images with me.  This is a complicated and difficult situation, I discussed it in detail with the patient's wife over the phone.  I explained there are 2 options, we could try to resuscitate him in the ICU, and consider attempting interventional radiology or colonoscopic interventions for this bleeding and colonic ileus.  The other option would be to proceed to the operating room for exploratory laparotomy, colectomy, and ostomy creation with control of the bleeding.  As he is in need of anticoagulation for his pulmonary embolisms, I feel definitive surgical treatment is the best option at this time.  The wife agreed.  I described the procedure, its risk, benefits, and alternatives.  After full discussion all questions answered the wife granted consent to proceed.  We will proceed to surgery emergently tonight.   Findings: Massively dilated colon with bleeding from the right colon mesentery near the hepatic flexure   Description of Procedure:   On the date stated above the patient was taken the operating room suite placed in supine position.  General  endotracheal anesthesia was induced.  A timeout was completed verifying the correct patient, procedure, position, and equipment needed for the case.  The patient's abdomen was prepped and draped in usual sterile fashion.    I made a midline laparotomy incision and entered the abdomen without any trauma the underlying viscera.  I encountered a massively dilated colon.  There was a  significant amount of hemoperitoneum with dark red blood.  It appeared this bleeding had been going on for a number of days.  The abdomen was sucked clear.  Approximately 2 L of blood were suctioned out of the abdomen.  There was another few 100 mL of blood likely in the lap pads.  I was unable to explore the abdomen due to the massive distention of the colon.  Laps were placed in the right upper quadrant to pack this area as it seems to be the source of the bleeding on the CT scan preoperatively and there was some bruising in the mesentery in this area.  I then brought the Bookwalter retractor onto the field and set up for retraction.  The transverse colon was then incised with a scalpel and the enterotomy was opened with a hemostat.  The suction was then placed inside the transverse colon to decompress the colon.  The utility enterotomy was then closed with a figure-of-eight Vicryl suture.  And I was now able to mobilize the colon.  The right colon was mobilized out of the retroperitoneum by dividing the attachments of the white line of Toldt.  I was able to fully medialized the right colon.  I was careful to avoid injury to the retroperitoneal structures and the duodenum.  I worked up towards the liver.  There was a large hematoma in the mesentery of the hepatic flexure of the colon in this area and it appeared this was the source of the bleeding issues.  The attachments between the hepatic flexure and the liver were divided and the right colon was fully mobilized.  I divided the terminal ileum using a GIA 75 stapler.  The mesentery of the colon was divided using the LigaSure.  I divided the mesentery across the transverse colon and I divided the gastrocolic attachments.  I carried this dissection as distally as possible on the transverse colon and then I flipped to mobilizing the left colon.  The white line of Toldt was divided and the left colon was medialized.  The sigmoid colon was severely dilated and was  not well decompressed by the utility enterotomy in the transverse colon.  A second enterotomy was made in the sigmoid colon as it was severely dilated and would have to be removed.  The suction was used to decompress this area which helped in mobilizing the sigmoid colon.  I worked from the sigmoid up the descending colon toward the splenic flexure.  I then worked both from the descending colon and from the transverse colon to divide the splenocolic attachments.  The splenic flexure was then able to be fully mobilized and delivered through the wound.  I then continued to divide the mesentery of the colon using the LigaSure device.  I stayed close to the colon to avoid injuring the ureters and other retroperitoneal structures.  I worked down to divide the mesentery all the way down to the sigmoid colon.  Another load of the GIA 75 mm stapler was fired to divide the colon at the rectosigmoid junction.  At this point the colon was fully resected and passed off  the field as a specimen.  Multiple liters of warm saline irrigation was then used to irrigate the abdomen.  There appeared to be good hemostasis.  I inspected in good detail of the right abdomen where the hepatic flexure had been removed.  I do not see any active bleeding in this area, there was some bruising in the retroperitoneum left behind from the hematoma cavity.  I did place some Arista in this area and then directed my attention to closure.  A defect was made in the abdominal wall to the right of the midline just above the umbilicus overlying the rectus muscle.  I made a circular incision in the skin a cruciate incision in the anterior rectus fascia the rectus muscle fibers were split along their length and an incision was made in the posterior rectus sheath and peritoneum.  The defect was dilated to 1 fingerbreadth and the terminal ileum staple line was delivered through this for later creation of the ostomy.  The midline fascia was closed using running  #1 strata fix suture.  The deep dermal layer was closed using 2-0 Vicryl and skin staples were used to close the skin.  The wound was covered with a towel and I matured the ostomy.  The bowel was opened using Metzenbaum scissors.  4 Brooks style 3-0 Vicryl sutures were placed 1 in each quadrant.  Multiple 3-0 Vicryl sutures were placed to reapproximate the mucocutaneous junction.  A sterile dressing was applied.  An ostomy appliance was applied.  All sponge needle counts were correct at the end of this case.  At the end of the case we reviewed the infection status of the case. Patient: [facility] Patient Case: Emergent Infection Present At Time Of Surgery (PATOS):  Some controlled contamination during the case with enterotomies in the colon for decompression  Louanna Raw, MD General, Bariatric, & Minimally Invasive Surgery [facility], [place]

## 2022-04-10 NOTE — Progress Notes (Addendum)
PROGRESS NOTE    Kyle Delacruz  YPP:[id number] DOB: 10-30-1955 DOA: 04/08/2022 PCP: Prince Solian, MD   Brief Narrative: Kyle Delacruz is a 66 y.o. male with a history of DVT/PE, cervical spine surgery, lumbar spine surgery, hypertension, hypothyroidism, prostate cancer, OSA. Patient presented secondary to tachycardia and shortness of breath and found to have evidence of acute saddle PE with possible heart strain. Heparin IV started. RLE venous duplex confirms chronic DVT. Admission complicated by constipation, abdominal distention and abdominal pain. GI consulted.   Assessment and Plan: * Acute pulmonary embolism ([place]) Recurrent. Patient with recent DVT/PE in 07/2021 and completed 6 months of Eliquis. CTA chest this admission confirms right acute PE. Right LE venous duplex is significant for chronic DVT. LLE venous duplex negative for acute DVT. Patient started on heparin IV for treatment. Transthoracic Echocardiogram ordered and significant for mild RV dilation with normal function. Hematology consulted and recommended lifelong anticoagulation; discussed resumption of Eliquis with patient who is in agreement. -Continue Heparin IV until hemoglobin shows stability; likely transition to Eliquis on 11/29  Leg DVT (deep venous thromboembolism), chronic, right [facility]) Radiology report suggests right LE DVT is chronic. Discussed with Dr. Carlis Abbott, vascular surgery, who agrees that DVT is likely chronic; no intervention.  Acute respiratory failure with hypoxia (HCC) Secondary to acute PE. Patient requiring 2 L/min of supplemental oxygen on admission. Weaned to room air at rest -Ambulatory pulse ox prior to discharge  Abdominal distension In setting of as needed opiate use. CT significant for stool and distention seen mostly in transverse colon. Patient started on Movantik this admission. SMOG enema with good result but minimal improvement. Today with associated severe pain which has improved  with change in position. No nausea/vomiting. -Continue Movantik for now -[facility] consult  Obstructive sleep apnea (adult) (pediatric) Chronic.  Gastro-esophageal reflux disease without esophagitis Stable.  Essential hypertension Not on antihypertensive drug regimen. Stable.  Hypothyroidism Stable.  Continue Synthroid 50 mcg daily.  Normocytic anemia Unclear etiology for chronic anemia. Baseline is around 12. Patient with a hemoglobin of 10.4 on admission with downward drift. Complicated by Heparin use for acute PE, but no obvious bleeding identified. -CBC in AM    DVT prophylaxis: Heparin IV Code Status:   Code Status: Full Code Family Communication: Wife at bedside Disposition Plan: Discharge home likely in 1 day pending ability to transition to outpatient anticoagulation regimen, wean to room air if able, improvement of constipation/GI recommendations   Consultants:  Hematology [facility]  Procedures:  Lower extremity venous duplex Transthoracic Echocardiogram  Antimicrobials: None    Subjective: Patient with continued abdominal pain. Pain described as sharp. Initially epigastric then moved to LLQ. He has had bowel movements with enema administration.  Objective: BP 113/69   Pulse (!) 114   Temp 97.9 F (36.6 C) (Oral)   Resp 18   Ht 5' 9.02" (1.753 m)   Wt 113.5 kg   SpO2 97%   BMI 36.93 kg/m   Examination:  General exam: Appears calm and comfortable Respiratory system: Clear to auscultation. Respiratory effort normal. Cardiovascular system: S1 & S2 heard, tachycardia, normal rhythm. Gastrointestinal system: Abdomen is severely distended, soft and slightly tender. Normal bowel sounds heard. Central nervous system: Alert and oriented. No focal neurological deficits. Musculoskeletal: No edema. No calf tenderness Skin: No cyanosis. No rashes Psychiatry: Judgement and insight appear normal. Mood & affect appropriate.    Data Reviewed: I have  personally reviewed following labs and imaging studies  CBC Lab Results  Component Value Date   WBC 8.9 04/10/2022   RBC 2.79 (L) 04/10/2022   HGB 8.8 (L) 04/10/2022   HCT 27.4 (L) 04/10/2022   MCV 95.3 04/10/2022   MCH 30.5 04/10/2022   PLT 187 04/10/2022   MCHC 32.0 04/10/2022   RDW 13.2 04/10/2022   LYMPHSABS 1.7 04/08/2022   MONOABS 1.3 (H) 04/08/2022   EOSABS 0.1 04/08/2022   BASOSABS 0.0 46/80/3212     Last metabolic panel Lab Results  Component Value Date   NA 133 (L) 04/08/2022   K 3.7 04/08/2022   CL 95 (L) 04/08/2022   CO2 27 04/08/2022   BUN 18 04/08/2022   CREATININE 1.41 (H) 04/08/2022   GLUCOSE 178 (H) 04/08/2022   GFRNONAA 56 (L) 04/08/2022   GFRAA >60 08/19/2019   CALCIUM 9.4 04/08/2022   PROT 6.3 (L) 07/27/2021   ALBUMIN 2.9 (L) 07/27/2021   BILITOT 1.3 (H) 07/27/2021   ALKPHOS 50 07/27/2021   AST 14 (L) 07/27/2021   ALT 13 07/27/2021   ANIONGAP 11 04/08/2022    GFR: Estimated Creatinine Clearance: 65.7 mL/min (A) (by C-G formula based on SCr of 1.41 mg/dL (H)).  Recent Results (from the past 240 hour(s))  Resp Panel by RT-PCR (Flu A&B, Covid) Anterior Nasal Swab     Status: None   Collection Time: 04/08/22  8:18 AM   Specimen: Anterior Nasal Swab  Result Value Ref Range Status   SARS Coronavirus 2 by RT PCR NEGATIVE NEGATIVE Final    Comment: (NOTE) SARS-CoV-2 target nucleic acids are NOT DETECTED.  The SARS-CoV-2 RNA is generally detectable in upper respiratory specimens during the acute phase of infection. The lowest concentration of SARS-CoV-2 viral copies this assay can detect is 138 copies/mL. A negative result does not preclude SARS-Cov-2 infection and should not be used as the sole basis for treatment or other patient management decisions. A negative result may occur with  improper specimen collection/handling, submission of specimen other than nasopharyngeal swab, presence of viral mutation(s) within the areas targeted by this  assay, and inadequate number of viral copies(<138 copies/mL). A negative result must be combined with clinical observations, patient history, and epidemiological information. The expected result is Negative.  Fact Sheet for Patients:  EntrepreneurPulse.com.au  Fact Sheet for Healthcare Providers:  IncredibleEmployment.be  This test is no t yet approved or cleared by the [place] FDA and  has been authorized for detection and/or diagnosis of SARS-CoV-2 by FDA under an Emergency Use Authorization (EUA). This EUA will remain  in effect (meaning this test can be used) for the duration of the COVID-19 declaration under Section 564(b)(1) of the Act, 21 U.S.C.section 360bbb-3(b)(1), unless the authorization is terminated  or revoked sooner.       Influenza A by PCR NEGATIVE NEGATIVE Final   Influenza B by PCR NEGATIVE NEGATIVE Final    Comment: (NOTE) The Xpert Xpress SARS-CoV-2/FLU/RSV plus assay is intended as an aid in the diagnosis of influenza from Nasopharyngeal swab specimens and should not be used as a sole basis for treatment. Nasal washings and aspirates are unacceptable for Xpert Xpress SARS-CoV-2/FLU/RSV testing.  Fact Sheet for Patients: EntrepreneurPulse.com.au  Fact Sheet for Healthcare Providers: IncredibleEmployment.be  This test is not yet approved or cleared by the [place] FDA and has been authorized for detection and/or diagnosis of SARS-CoV-2 by FDA under an Emergency Use Authorization (EUA). This EUA will remain in effect (meaning this test can be used) for the duration of the COVID-19 declaration under Section 564(b)(1)  of the Act, 21 U.S.C. section 360bbb-3(b)(1), unless the authorization is terminated or revoked.  Performed at [facility], [address] [place], [place] [address]   MRSA Next Gen by PCR, Nasal     Status: None   Collection Time:  04/08/22  6:26 PM   Specimen: Nasal Mucosa; Nasal Swab  Result Value Ref Range Status   MRSA by PCR Next Gen NOT DETECTED NOT DETECTED Final    Comment: (NOTE) The GeneXpert MRSA Assay (FDA approved for NASAL specimens only), is one component of a comprehensive MRSA colonization surveillance program. It is not intended to diagnose MRSA infection nor to guide or monitor treatment for MRSA infections. Test performance is not FDA approved in patients less than 6 years old. Performed at [facility], [place] [address], [place], [place] [address]       Radiology Studies: DG Abd Portable 1V  Result Date: 04/10/2022 CLINICAL DATA:  [id number] Abdominal pain [id number] EXAM: PORTABLE ABDOMEN - 1 VIEW COMPARISON:  CT abdomen pelvis 04/08/2022 FINDINGS: Persistent marked gaseous dilatation of the colon (up to 12 cm on radiograph). Limited evaluation for free gas on this supine radiograph. No definite radiographic configuration to suggest volvulus. No radio-opaque calculi or other significant radiographic abnormality are seen. Lumbar surgical hardware and bilateral hip total arthroplasty. IMPRESSION: Grossly stable with persistent marked gaseous dilatation of the colon. Finding better evaluated on CT abdomen 04/08/2022. Electronically Signed   By: Iven Finn M.D.   On: 04/10/2022 12:45   ECHOCARDIOGRAM COMPLETE  Result Date: 04/09/2022    ECHOCARDIOGRAM REPORT   Patient Name:   Kyle Delacruz [facility] Date of Exam: 04/09/2022 Medical Rec #:  [id number]       Height:       69.0 in Accession #:    [id number]      Weight:       252.6 lb Date of Birth:  31-Oct-1955       BSA:          2.282 m Patient Age:    73 years        BP:           111/69 mmHg Patient Gender: M               HR:           97 bpm. Exam Location:  Inpatient Procedure: 2D Echo, Cardiac Doppler and Color Doppler Indications:    I26.02 Pulmonary embolus  History:        Patient has prior history of Echocardiogram examinations, most                  recent 07/27/2021. Abnormal ECG, COPD, Signs/Symptoms:Dyspnea and                 Shortness of Breath; Risk Factors:Hypertension, Dyslipidemia,                 Sleep Apnea and Current Smoker.  Sonographer:    Roseanna Rainbow RDCS Referring Phys: [id number] ERIC CHEN  Sonographer Comments: Technically difficult study due to poor echo windows and patient is obese. Image acquisition challenging due to patient body habitus. IMPRESSIONS  1. Left ventricular ejection fraction, by estimation, is 60 to 65%. The left ventricle has normal function. The left ventricle has no regional wall motion abnormalities. Left ventricular diastolic parameters are consistent with Grade I diastolic dysfunction (impaired relaxation).  2. Right ventricular systolic function is normal. The right ventricular size is mildly enlarged. There  is mildly elevated pulmonary artery systolic pressure. The estimated right ventricular systolic pressure is 57.3 mmHg.  3. The mitral valve is normal in structure. No evidence of mitral valve regurgitation. No evidence of mitral stenosis.  4. The aortic valve is tricuspid. Aortic valve regurgitation is not visualized. No aortic stenosis is present.  5. Aortic dilatation noted. There is mild dilatation of the ascending aorta, measuring 40 mm.  6. The inferior vena cava is normal in size with greater than 50% respiratory variability, suggesting right atrial pressure of 3 mmHg. FINDINGS  Left Ventricle: Left ventricular ejection fraction, by estimation, is 60 to 65%. The left ventricle has normal function. The left ventricle has no regional wall motion abnormalities. The left ventricular internal cavity size was normal in size. There is  no left ventricular hypertrophy. Left ventricular diastolic parameters are consistent with Grade I diastolic dysfunction (impaired relaxation). Right Ventricle: The right ventricular size is mildly enlarged. No increase in right ventricular wall thickness. Right ventricular  systolic function is normal. There is mildly elevated pulmonary artery systolic pressure. The tricuspid regurgitant velocity is 2.92 m/s, and with an assumed right atrial pressure of 3 mmHg, the estimated right ventricular systolic pressure is 22.0 mmHg. Left Atrium: Left atrial size was normal in size. Right Atrium: Right atrial size was normal in size. Pericardium: There is no evidence of pericardial effusion. Mitral Valve: The mitral valve is normal in structure. No evidence of mitral valve regurgitation. No evidence of mitral valve stenosis. Tricuspid Valve: The tricuspid valve is normal in structure. Tricuspid valve regurgitation is trivial. Aortic Valve: The aortic valve is tricuspid. Aortic valve regurgitation is not visualized. No aortic stenosis is present. Pulmonic Valve: The pulmonic valve was normal in structure. Pulmonic valve regurgitation is not visualized. Aorta: The aortic root is normal in size and structure and aortic dilatation noted. There is mild dilatation of the ascending aorta, measuring 40 mm. Venous: The inferior vena cava is normal in size with greater than 50% respiratory variability, suggesting right atrial pressure of 3 mmHg. IAS/Shunts: No atrial level shunt detected by color flow Doppler.  LEFT VENTRICLE PLAX 2D LVIDd:         4.40 cm     Diastology LVIDs:         2.50 cm     LV e' medial:    6.64 cm/s LV PW:         1.40 cm     LV E/e' medial:  9.1 LV IVS:        1.10 cm     LV e' lateral:   8.05 cm/s LVOT diam:     2.10 cm     LV E/e' lateral: 7.5 LV SV:         53 LV SV Index:   23 LVOT Area:     3.46 cm  LV Volumes (MOD) LV vol d, MOD A2C: 62.5 ml LV vol d, MOD A4C: 70.5 ml LV vol s, MOD A2C: 20.0 ml LV vol s, MOD A4C: 20.0 ml LV SV MOD A2C:     42.5 ml LV SV MOD A4C:     70.5 ml LV SV MOD BP:      48.4 ml RIGHT VENTRICLE             IVC RV S prime:     13.20 cm/s  IVC diam: 0.90 cm TAPSE (M-mode): 1.6 cm LEFT ATRIUM             Index  RIGHT ATRIUM           Index LA diam:         3.10 cm 1.36 cm/m   RA Area:     10.80 cm LA Vol (A2C):   18.5 ml 8.11 ml/m   RA Volume:   21.70 ml  9.51 ml/m LA Vol (A4C):   27.1 ml 11.88 ml/m LA Biplane Vol: 24.2 ml 10.61 ml/m  AORTIC VALVE LVOT Vmax:   101.45 cm/s LVOT Vmean:  71.550 cm/s LVOT VTI:    0.154 m  AORTA Ao Root diam: 3.50 cm Ao Asc diam:  3.95 cm MITRAL VALVE               TRICUSPID VALVE MV Area (PHT): 4.10 cm    TR Peak grad:   34.1 mmHg MV Decel Time: 185 msec    TR Vmax:        292.00 cm/s MV E velocity: 60.65 cm/s MV A velocity: 62.30 cm/s  SHUNTS MV E/A ratio:  0.97        Systemic VTI:  0.15 m                            Systemic Diam: 2.10 cm Dalton McleanMD Electronically signed by Franki Monte Signature Date/Time: 04/09/2022/2:41:12 PM    Final    VAS [place] LOWER EXTREMITY VENOUS (DVT)  Result Date: 04/09/2022  Lower Venous DVT Study Patient Name:  Kyle Delacruz [facility]  Date of Exam:   04/09/2022 Medical Rec #: [id number]        Accession #:    [id number] Date of Birth: [birth year]        Patient Gender: M Patient Age:   18 years Exam Location:  [facility] Procedure:      VAS [place] LOWER EXTREMITY VENOUS (DVT) Referring Phys: Jullia Mulligan --------------------------------------------------------------------------------  Indications: Pulmonary embolism, and Edema. Other Indications: Hx of RLE DVT & PE 07/26/21 post spinal surgery. Continued                    chronic RLE DVT with improvement and reconstituted flow in                    RLEV studies performed on 10/06/21, 12/22/21, and 02/13/22.                    Recent spinal surgery on 04/04/22 now with new PE and                    occlusive RLE DVT. Limitations: Poor ultrasound/tissue interface. Comparison Study: No previous LLEV exams Performing Technologist: Jody Hill RVT, RDMS  Examination Guidelines: A complete evaluation includes B-mode imaging, spectral Doppler, color Doppler, and power Doppler as needed of all accessible portions of each vessel. Bilateral testing is  considered an integral part of a complete examination. Limited examinations for reoccurring indications may be performed as noted. The reflux portion of the exam is performed with the patient in reverse Trendelenburg.  +-----+---------------+---------+-----------+----------+--------------+ RIGHTCompressibilityPhasicitySpontaneityPropertiesThrombus Aging +-----+---------------+---------+-----------+----------+--------------+ CFV  Full           Yes      Yes                                 +-----+---------------+---------+-----------+----------+--------------+   +---------+---------------+---------+-----------+----------+--------------+ LEFT     CompressibilityPhasicitySpontaneityPropertiesThrombus Aging +---------+---------------+---------+-----------+----------+--------------+ CFV  Full           Yes      Yes                                 +---------+---------------+---------+-----------+----------+--------------+ SFJ      Full                                                        +---------+---------------+---------+-----------+----------+--------------+ FV Prox  Full           Yes      Yes                                 +---------+---------------+---------+-----------+----------+--------------+ FV Mid   Full           Yes      Yes                                 +---------+---------------+---------+-----------+----------+--------------+ FV DistalFull           Yes      Yes                                 +---------+---------------+---------+-----------+----------+--------------+ PFV      Full                                                        +---------+---------------+---------+-----------+----------+--------------+ POP      Full           Yes      Yes                                 +---------+---------------+---------+-----------+----------+--------------+ PTV      Full                                                         +---------+---------------+---------+-----------+----------+--------------+ PERO     Full                                                        +---------+---------------+---------+-----------+----------+--------------+     Summary: RIGHT: - No evidence of common femoral vein obstruction.  LEFT: - There is no evidence of deep vein thrombosis in the lower extremity.  - No cystic structure found in the popliteal fossa.  *See table(s) above for measurements and observations. Electronically signed by Monica Martinez MD on 04/09/2022 at 12:41:12 PM.    Final       LOS: 2 days    Cordelia Poche, MD [facility]  04/10/2022, 1:55 PM   If 7PM-7AM, please contact night-coverage [email or web address]

## 2022-04-10 NOTE — Consult Note (Signed)
Consulting Physician: Nickola Major Quentyn Kolbeck  Referring Provider: Erskine Emery, MD CCM  Chief Complaint: SOB, Tachycardia, Constipation  Reason for Consult: Hemoperitoneum   Subjective   HPI: Kyle Delacruz is an 66 y.o. male who is here for shortness of breath, tachycardia, and the constipation after lumbar spine surgery.  On 04/04/22, he underwent the following surgery by Dr. Sherlyn Lick at [facility]: 1. Right-sided L2-3, L3-4, L4-5 transforaminal lumbar interbody fusion with 1 titanium cage at each level (95093, 22634 x 2, 22853 x 3)  2. Posterior spinal fusion at L2-3, L3-4, L4-5 3. Posterior spinal segmental instrumentation at L2, L3, L4, and L5 with a 1 pedicle screw at each level bilaterally (26712) 4.  Right-sided hemilaminectomy at  L2-3 with decompression of the lateral recess in a level where a transforaminal lumbar interbody fusion was performed (45809) 5. Right-sided hemilaminectomy at L3-4 with decompression of the lateral recess in a level where a transforaminal lumbar interbody fusion was performed(63053) 6. Right-sided hemilaminectomy at L4-5 with decompression of the lateral recess in a level where a transforaminal lumbar interbody fusion was performed(63053) 7. Bone grafting using patient's own autograft,allograft and BMP(20930,20936) 8. Placement of pedicle screws with navigation and stealth as compared with pre-operative [phone]   EBL was noted to be 750 mL in the operative note.  He was prescribed 81 mg aspirin and Lovenox at discharge, but was told to wait 4 days prior to starting Lovenox.  He had a history of DVT after cervical spine surgery, and had completed his 7-monthcourse of Eliquis prior to this operation.  He was discharged on Thanksgiving, 04/05/2022.  [facility].  He complained of tachycardia, shortness of breath, and constipation.  He was diagnosed with a  pulmonary embolism on CT, and started on heparin.  His heparin was supra-therapeutic and then dropped into a therapeutic range.  Today his blood pressure dropped he was transferred to the ICU.  He was intubated and a CT scan was completed.  He was started on Levophed.  His hemoglobin was noted to drop from 10-6 over the last few days.  The CT scan demonstrated hemoperitoneum so surgery consult was placed.      Surgeon: SJuliann Mule and Role:    * RIvan Croft MD - Primary   Past Medical History:  Diagnosis Date   Allergy    Arthritis    osteo   CTS (carpal tunnel syndrome)    DJD (degenerative joint disease)    DM2 (diabetes mellitus, type 2) ([facility]    DVT (deep venous thrombosis) (HCC)    GERD (gastroesophageal reflux disease)    Gout    History of adenomatous polyps of colon 12/29/2019   History of nuclear stress test    Myoview 11/17: EF 54, no ST changes, hypertensive blood pressure response, no ischemia, low risk   Hyperlipidemia    Hypothyroidism    Nicotine addiction    OSA (obstructive sleep apnea)    Prostatitis    Reactive airway disease    Sarcocystosis    Sinus arrhythmia    Sleep apnea    cpap- not wearing   Tinea corporis     Past Surgical History:  Procedure Laterality Date   BILATERAL KNEE ARTHROSCOPY     right- 1997; left 2001   COLONOSCOPY  multiple last 2013   KNEE SURGERY Right 2014   torn R medial meniscus- arthroscopy done   PARATHYROIDECTOMY  2005   ROTATOR CUFF REPAIR  2004   right   TONSILLECTOMY     TOTAL HIP ARTHROPLASTY Left 2009   TOTAL HIP ARTHROPLASTY Right 2006   UPPER GASTROINTESTINAL ENDOSCOPY      Family History  Problem Relation Age of Onset   Stroke Mother    Diabetes Father    Heart disease Father        sp CABG   Diabetes Paternal Grandmother    Colon cancer Neg Hx    Stomach cancer Neg Hx    Esophageal cancer Neg Hx    Rectal cancer Neg Hx    Prostate cancer Neg Hx     Social:  reports that he has quit smoking.  His smoking use included cigarettes. He smoked an average of .5 packs per day. He has never used smokeless tobacco. He reports current alcohol use of about 21.0 standard drinks of alcohol per week. He reports that he does not use drugs.  Allergies:  Allergies  Allergen Reactions   Bee Venom     Other reaction(s): Unknown   Lisinopril     Other reaction(s): Unknown   Metoclopramide Other (See Comments)    Other reaction(s): shaking too bad "Sweat like crazy"    Penicillins Hives    Did it involve swelling of the face/tongue/throat, SOB, or low BP? Yes Did it involve sudden or severe rash/hives, skin peeling, or any reaction on the inside of your mouth or nose? Yes Did you need to seek medical attention at a hospital or doctor's office? No When did it last happen?  childhood      If all above answers are "NO", may proceed with cephalosporin use.     Shellfish Allergy Swelling    Medications: Current Outpatient Medications  Medication Instructions   albuterol (VENTOLIN HFA) 108 (90 Base) MCG/ACT inhaler 2 puffs, Inhalation, Every 6 hours PRN   allopurinol (ZYLOPRIM) 300 mg, Oral, Daily at bedtime   Crestor 20 mg, Oral, Daily   cyclobenzaprine (FLEXERIL) 10 mg, Oral, 3 times daily PRN   enoxaparin (LOVENOX) 40 mg, Subcutaneous, Daily   fluticasone (FLONASE) 50 MCG/ACT nasal spray INSTILL 2 SPRAYS IN EACH NOSTRIL DAILY   HYDROcodone-acetaminophen (NORCO/VICODIN) 5-325 MG tablet 1 tablet, Oral, 2 times daily PRN   levothyroxine (SYNTHROID) 50 mcg, Oral, Daily   omeprazole (PRILOSEC) 40 mg, Oral, 2 times daily   oxyCODONE-acetaminophen (PERCOCET) 10-325 MG tablet 1 tablet, Oral, Every 4 hours PRN   Probiotic Product (ALIGN) 4 MG CAPS 1 capsule, Oral, Daily    ROS - all of the below systems have been reviewed with the patient and positives are indicated with bold text General: chills, fever or night sweats Eyes: blurry vision or double vision ENT: epistaxis or sore  throat Allergy/Immunology: itchy/watery eyes or nasal congestion Hematologic/Lymphatic: bleeding problems, blood clots or swollen lymph nodes Endocrine: temperature intolerance or unexpected weight changes Breast: new or changing breast lumps or nipple discharge Resp: cough, shortness of breath, or wheezing CV: chest pain or dyspnea on exertion GI: as per HPI GU: dysuria, trouble voiding, or hematuria MSK: joint pain or joint stiffness Neuro: TIA or stroke symptoms Derm: pruritus and skin lesion changes Psych: anxiety and depression  Objective   PE Blood pressure (!) 85/55, pulse (!) 140, temperature 98.3 F (36.8 C), temperature source Axillary, resp. rate (!) 26, height '5\' 10"'$  (1.778 m), weight 113.5 kg, SpO2 99 %. Constitutional: Intubated and responsive in the ICU Eyes: Moist conjunctiva; no lid lag; anicteric; PERRL  Neck: Trachea midline; no thyromegaly Lungs: Ventilated, bilateral breath sounds CV: Tachycardia, hypotensive, pressor support in ICU GI: Abd tightly distended, tender MSK: Normal range of motion of extremities; no clubbing/cyanosis Psychiatric: Intubated and responsive in the ICU Lymphatic: No palpable cervical or axillary lymphadenopathy  Results for orders placed or performed during the hospital encounter of 04/08/22 (from the past 24 hour(s))  CBC     Status: Abnormal   Collection Time: 04/10/22  2:40 AM  Result Value Ref Range   WBC 8.9 4.0 - 10.5 K/uL   RBC 2.79 (L) 4.22 - 5.81 MIL/uL   Hemoglobin 8.5 (L) 13.0 - 17.0 g/dL   HCT 26.6 (L) 39.0 - 52.0 %   MCV 95.3 80.0 - 100.0 fL   MCH 30.5 26.0 - 34.0 pg   MCHC 32.0 30.0 - 36.0 g/dL   RDW 13.2 11.5 - 15.5 %   Platelets 187 150 - 400 K/uL   nRBC 0.0 0.0 - 0.2 %  Heparin level (unfractionated)     Status: Abnormal   Collection Time: 04/10/22  2:40 AM  Result Value Ref Range   Heparin Unfractionated 0.73 (H) 0.30 - 0.70 IU/mL  PSA     Status: Abnormal   Collection Time: 04/10/22  2:40 AM  Result  Value Ref Range   Prostatic Specific Antigen 5.07 (H) 0.00 - 4.00 ng/mL  Heparin level (unfractionated)     Status: None   Collection Time: 04/10/22 10:35 AM  Result Value Ref Range   Heparin Unfractionated 0.59 0.30 - 0.70 IU/mL  Hemoglobin and hematocrit, blood     Status: Abnormal   Collection Time: 04/10/22 12:27 PM  Result Value Ref Range   Hemoglobin 8.8 (L) 13.0 - 17.0 g/dL   HCT 27.4 (L) 39.0 - 52.0 %  Troponin I (High Sensitivity)     Status: None   Collection Time: 04/10/22  2:28 PM  Result Value Ref Range   Troponin I (High Sensitivity) 8 <18 ng/L  Lactic acid, plasma     Status: Abnormal   Collection Time: 04/10/22  2:28 PM  Result Value Ref Range   Lactic Acid, Venous 4.4 (HH) 0.5 - 1.9 mmol/L  Basic metabolic panel     Status: Abnormal   Collection Time: 04/10/22  2:30 PM  Result Value Ref Range   Sodium 135 135 - 145 mmol/L   Potassium 3.6 3.5 - 5.1 mmol/L   Chloride 96 (L) 98 - 111 mmol/L   CO2 26 22 - 32 mmol/L   Glucose, Bld 199 (H) 70 - 99 mg/dL   BUN 14 8 - 23 mg/dL   Creatinine, Ser 1.49 (H) 0.61 - 1.24 mg/dL   Calcium 8.5 (L) 8.9 - 10.3 mg/dL   GFR, Estimated 52 (L) >60 mL/min   Anion gap 13 5 - 15  Draw ABG 1 hour after initiation of ventilator     Status: Abnormal   Collection Time: 04/10/22  3:54 PM  Result Value Ref Range   pH, Arterial 7.45 7.35 - 7.45   pCO2 arterial 34 32 - 48 mmHg   pO2, Arterial 329 (H) 83 - 108 mmHg   Bicarbonate 23.5 20.0 - 28.0 mmol/L   Acid-Base Excess 0.3 0.0 - 2.0 mmol/L   O2 Saturation 100 %   Patient temperature 37.4    Allens test (pass/fail) PASS PASS  Lactic acid, plasma     Status: Abnormal   Collection Time: 04/10/22  4:19 PM  Result Value Ref Range   Lactic Acid, Venous  2.9 (HH) 0.5 - 1.9 mmol/L  Type and screen [place]     Status: None (Preliminary result)   Collection Time: 04/10/22  4:19 PM  Result Value Ref Range   ABO/RH(D) AB POS    Antibody Screen NEG    Sample Expiration  04/13/2022,2359    Unit Number [id number]    Blood Component Type RED CELLS,LR    Unit division 00    Status of Unit ISSUED    Transfusion Status OK TO TRANSFUSE    Crossmatch Result Compatible    Unit Number [id number]    Blood Component Type RED CELLS,LR    Unit division 00    Status of Unit ISSUED    Transfusion Status OK TO TRANSFUSE    Crossmatch Result Compatible    Unit Number [id number]    Blood Component Type RED CELLS,LR    Unit division 00    Status of Unit ISSUED    Transfusion Status OK TO TRANSFUSE    Crossmatch Result      Compatible Performed at [facility] [address] [place], [place] [address]    Unit Number [id number]    Blood Component Type RED CELLS,LR    Unit division 00    Status of Unit ISSUED    Transfusion Status OK TO TRANSFUSE    Crossmatch Result Compatible    Unit Number [id number]    Blood Component Type RED CELLS,LR    Unit division 00    Status of Unit ALLOCATED    Transfusion Status OK TO TRANSFUSE    Crossmatch Result Compatible    Unit Number [id number]    Blood Component Type RED CELLS,LR    Unit division 00    Status of Unit ISSUED    Transfusion Status OK TO TRANSFUSE    Crossmatch Result Compatible    Unit Number [id number]    Blood Component Type RED CELLS,LR    Unit division 00    Status of Unit ALLOCATED    Transfusion Status OK TO TRANSFUSE    Crossmatch Result Compatible    Unit Number [id number]    Blood Component Type RED CELLS,LR    Unit division 00    Status of Unit ALLOCATED    Transfusion Status OK TO TRANSFUSE    Crossmatch Result Compatible    Unit Number [id number]    Blood Component Type RED CELLS,LR    Unit division 00    Status of Unit ISSUED    Transfusion Status OK TO TRANSFUSE    Crossmatch Result Compatible   Hemoglobin and hematocrit, blood     Status: Abnormal   Collection Time: 04/10/22  4:19 PM  Result Value Ref Range   Hemoglobin 6.4 (LL)  13.0 - 17.0 g/dL   HCT 19.9 (L) 39.0 - 52.0 %  Troponin I (High Sensitivity)     Status: None   Collection Time: 04/10/22  4:19 PM  Result Value Ref Range   Troponin I (High Sensitivity) 8 <18 ng/L  Prepare RBC (crossmatch)     Status: None   Collection Time: 04/10/22  5:30 PM  Result Value Ref Range   Order Confirmation      ORDER PROCESSED BY BLOOD BANK Performed at [facility] [address], [place], [place] [address]   Prepare RBC (crossmatch)     Status: None   Collection Time: 04/10/22  6:19 PM  Result Value Ref Range   Order Confirmation  ORDER PROCESSED BY BLOOD BANK Performed at [facility], [place] [address] [place], [place] [address]   Prepare fresh frozen plasma     Status: None (Preliminary result)   Collection Time: 04/10/22  6:20 PM  Result Value Ref Range   Unit Number [id number]    Blood Component Type LIQ PLASMA    Unit division 00    Status of Unit ISSUED    Transfusion Status OK TO TRANSFUSE    Unit Number [id number]    Blood Component Type LIQ PLASMA    Unit division 00    Status of Unit ISSUED    Transfusion Status OK TO TRANSFUSE    Unit Number [id number]    Blood Component Type LIQ PLASMA    Unit division 00    Status of Unit ISSUED    Transfusion Status OK TO TRANSFUSE    Unit Number [id number]    Blood Component Type THAWED PLASMA    Unit division 00    Status of Unit ALLOCATED    Transfusion Status OK TO TRANSFUSE    Unit Number [id number]    Blood Component Type THAWED PLASMA    Unit division 00    Status of Unit ALLOCATED    Transfusion Status OK TO TRANSFUSE    Unit Number [id number]    Blood Component Type THW PLS APHR    Unit division B0    Status of Unit ALLOCATED    Transfusion Status OK TO TRANSFUSE    Unit Number [id number]    Blood Component Type THAWED PLASMA    Unit division 00    Status of Unit ALLOCATED    Transfusion Status OK TO TRANSFUSE    Unit Number  [id number]    Blood Component Type THAWED PLASMA    Unit division 00    Status of Unit ALLOCATED    Transfusion Status OK TO TRANSFUSE   Prepare platelet pheresis     Status: None (Preliminary result)   Collection Time: 04/10/22  6:40 PM  Result Value Ref Range   Unit Number [id number]    Blood Component Type PLTP2 PSORALEN TREATED    Unit division 00    Status of Unit ALLOCATED    Transfusion Status OK TO TRANSFUSE      Imaging Orders         CT ABDOMEN PELVIS W CONTRAST         CT L-SPINE NO CHARGE         CT Angio Chest PE W and/or Wo Contrast         US Venous Img Lower Unilateral Right         DG Abd Portable 1V         DG Abd 1 View         CT ABDOMEN PELVIS WO CONTRAST         Portable Chest x-ray         VAS [place] LOWER EXTREMITY VENOUS (DVT)      Assessment and Plan   Kyle Delacruz is an 66 y.o. male, admitted to the medicine service with a pulmonary embolism and colonic ileus after lumbar spine surgery.  He decompensated today and now has hemoperitoneum on CT scan.  I wonder if the ileus distended the colon to the point of tearing an attachment to the liver, or a vessel on the colon itself causing this bleeding.  Certainly the heparin predisposed him to a bleeding issue.  I discussed this difficult  case with my partner Dr. Grandville Silos who is on-call at the trauma center and reviewed the images with me.  This is a complicated and difficult situation, I discussed it in detail with the patient's wife over the phone.  I explained there are 2 options, we could try to resuscitate him in the ICU, and consider attempting interventional radiology or colonoscopic interventions for this bleeding and colonic ileus.  The other option would be to proceed to the operating room for exploratory laparotomy, colectomy, and ostomy creation with control of the bleeding.  As he is in need of anticoagulation for his pulmonary embolisms, I feel definitive surgical treatment is the best option at  this time.  The wife agreed.  I described the procedure, its risk, benefits, and alternatives.  After full discussion all questions answered the wife granted consent to proceed.  We will proceed to surgery emergently tonight.   Felicie Morn, MD  [facility] Use [email or web address] to contact on call provider  New Patient Billing: [phone] - High MDM

## 2022-04-10 NOTE — Progress Notes (Signed)
At 1404 patients BP: 53/41. This RN to bedside BP cuff repositioned. Follow up BP 73/47. MD Cordelia Poche notified. MD to bedside. CCM consulted. See new orders. This RN continues to be at bedside with patient

## 2022-04-10 NOTE — Transfer of Care (Signed)
Immediate Anesthesia Transfer of Care Note  Patient: [place]  Procedure(s) Performed: EXPLORATORY LAPAROTOMY, TOTAL COLECTOMY, END ILEOSTOMY  Patient Location: ICU  Anesthesia Type:General  Level of Consciousness: sedated and Patient remains intubated per anesthesia plan  Airway & Oxygen Therapy: Patient remains intubated per anesthesia plan and Patient placed on Ventilator (see vital sign flow sheet for setting)  Post-op Assessment: Report given to RN and Post -op Vital signs reviewed and stable  Post vital signs: Reviewed and stable  Last Vitals:  Vitals Value Taken Time  BP 132/68 2320  Temp    Pulse 80 04/10/22 2318  Resp 22 04/10/22 2318  SpO2 99 % 04/10/22 2318  Vitals shown include unvalidated device data.  Last Pain:  Vitals:   04/10/22 1926  TempSrc: Axillary  PainSc:          Complications: No notable events documented.

## 2022-04-10 NOTE — Anesthesia Postprocedure Evaluation (Signed)
Anesthesia Post Note  Patient: [place]  Procedure(s) Performed: EXPLORATORY LAPAROTOMY, TOTAL COLECTOMY, END ILEOSTOMY     Patient location during evaluation: SICU Anesthesia Type: General Level of consciousness: sedated Pain management: pain level controlled Vital Signs Assessment: post-procedure vital signs reviewed and stable Respiratory status: patient remains intubated per anesthesia plan Cardiovascular status: stable Postop Assessment: no apparent nausea or vomiting Anesthetic complications: no   No notable events documented.               Saren Corkern,W. EDMOND

## 2022-04-10 NOTE — Procedures (Signed)
Central Venous Catheter Insertion Procedure Note  ELIAM SNAPP  [id number]  1955/06/10  Date:04/10/22  Time:4:45 PM   Provider Performing:Tarun Patchell Cipriano Mile   Procedure: Insertion of Non-tunneled Central Venous [phone]) with US guidance (60109)   Indication(s) Medication administration  Consent Risks of the procedure as well as the alternatives and risks of each were explained to the patient and/or caregiver.  Consent for the procedure was obtained and is signed in the bedside chart  Anesthesia Topical only with 1% lidocaine   Timeout Verified patient identification, verified procedure, site/side was marked, verified correct patient position, special equipment/implants available, medications/allergies/relevant history reviewed, required imaging and test results available.  Sterile Technique Maximal sterile technique including full sterile barrier drape, hand hygiene, sterile gown, sterile gloves, mask, hair covering, sterile ultrasound probe cover (if used).  Procedure Description Area of catheter insertion was cleaned with chlorhexidine and draped in sterile fashion.  With real-time ultrasound guidance a central venous catheter was placed into the left internal jugular vein. Nonpulsatile blood flow and easy flushing noted in all ports.  The catheter was sutured in place and sterile dressing applied.  Complications/Tolerance None; patient tolerated the procedure well. Chest X-ray is ordered to verify placement for internal jugular or subclavian cannulation.   Chest x-ray is not ordered for femoral cannulation.  EBL Minimal  Specimen(s) None

## 2022-04-10 NOTE — Progress Notes (Signed)
Assisted with transporting PT to and from [facility] CT while on vent (100% Fi02).

## 2022-04-10 NOTE — Progress Notes (Signed)
ANTICOAGULATION CONSULT NOTE - Follow Up Consult  Pharmacy Consult for Heparin Indication: pulmonary embolus and DVT  Allergies  Allergen Reactions   Bee Venom     Other reaction(s): Unknown   Lisinopril     Other reaction(s): Unknown   Metoclopramide Other (See Comments)    Other reaction(s): shaking too bad "Sweat like crazy"    Penicillins Hives    Did it involve swelling of the face/tongue/throat, SOB, or low BP? Yes Did it involve sudden or severe rash/hives, skin peeling, or any reaction on the inside of your mouth or nose? Yes Did you need to seek medical attention at a hospital or doctor's office? No When did it last happen?  childhood      If all above answers are "NO", may proceed with cephalosporin use.     Shellfish Allergy Swelling    Patient Measurements: Height: 5' 9.02" (175.3 cm) Weight: 113.5 kg (250 lb 3.6 oz) IBW/kg (Calculated) : 70.74 Heparin Dosing Weight: 94 kg  Vital Signs: Temp: 98.1 F (36.7 C) (11/28 0800) Temp Source: Oral (11/28 0800) BP: 141/85 (11/28 0800) Pulse Rate: 87 (11/28 0800)  Labs: Recent Labs    04/08/22 0818 04/08/22 1021 04/08/22 1132 04/08/22 1314 04/08/22 1859 04/09/22 0140 04/09/22 0234 04/10/22 0240 04/10/22 1035  HGB 10.4*  --   --   --   --   --  9.4* 8.5*  --   HCT 31.5*  --   --   --   --   --  29.2* 26.6*  --   PLT 184  --   --   --   --   --  188 187  --   APTT  --  29  --   --   --   --   --   --   --   LABPROT  --  13.9  --   --   --   --   --   --   --   INR  --  1.1  --   --   --   --   --   --   --   HEPARINUNFRC  --   --    < >  --    < > 0.65  --  0.73* 0.59  CREATININE 1.41*  --   --   --   --   --   --   --   --   TROPONINIHS  --  8  --  11  --   --   --   --   --    < > = values in this interval not displayed.     Estimated Creatinine Clearance: 65.7 mL/min (A) (by C-G formula based on SCr of 1.41 mg/dL (H)).   Medications:  Infusions:   heparin 1,400 Units/hr (04/10/22 0800)     Assessment: 19 yoM presented on 11/26 for cough, SOB, abdominal pain.  PMH significant for DVT/PE after previous cervical spine surgery 07/17/21 and he completed a course of apixaban in August.  He had a recent L2-L5 fusion on 04/04/22 and had a postop plan to be on Lovenox '40mg'$  (filled 04/04/22 x7 days).  Today, CTa is positive for acute PE with right heart strain and postop ileus.  Dopplers are positive for DVT in RLE.   04/10/22 Heparin level 0.59 -therapeutic after heparin rate decreased to 1400 units/hr this morning Hgb low 8.5, Pltc wnl No bleeding or complications reported.   Goal  of Therapy:  Heparin level 0.3-0.7 units/ml Monitor platelets by anticoagulation protocol: Yes   Plan:  Continue heparin drip @ 1400 units/hr Daily heparin level and CBC F/u transition to DOAC: Onc rec lifelong anticoagulation & rec treatment dose Eliquis  Eudelia Bunch, Pharm.D Use secure chat for questions 04/10/2022 11:37 AM

## 2022-04-10 NOTE — Consult Note (Signed)
NAME:  Kyle Delacruz, MRN:  [id number], DOB:  April 19, 1956, LOS: 2 ADMISSION DATE:  04/08/2022, CONSULTATION DATE:  04/10/22 REFERRING MD:  Lonny Prude, CHIEF COMPLAINT:  abd pain   History of Present Illness:  66 year old man with hx of provoked DVT who presents after lumbar surgery with worsening SOB and RLE swelling.  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  Echo/CTA showing some RV strain but nothing alarming.  Hospital course complicated by severe constipation vs. Colonic ileus partially relieved with enema 04/10/22.  Today after enema had searing epigastric pain radiation to shoulder, worse with movement.  Family at bedside states abdomen looks less distended than yesterday but still markedly different from normal.  Pertinent  Medical History  Recurrent VTE DM2 HLD Hypothyroidism OSA  Significant Hospital Events: Including procedures, antibiotic start and stop dates in addition to other pertinent events   11/26 admit 11/28 PCCM consult  Interim History / Subjective:  Consulted  Objective   Blood pressure 113/69, pulse (!) 114, temperature 97.9 F (36.6 C), temperature source Oral, resp. rate 18, height 5' 9.02" (1.753 m), weight 113.5 kg, SpO2 97 %.        Intake/Output Summary (Last 24 hours) at 04/10/2022 1450 Last data filed at 04/10/2022 1200 Gross per 24 hour  Intake 382.51 ml  Output 550 ml  Net -167.49 ml   Filed Weights   04/08/22 1004 04/09/22 0500 04/10/22 0400  Weight: 106.6 kg 114.6 kg 113.5 kg    Examination: General: no distress (just got dilaudid) HENT: MMM, malampatti 3 Lungs: Diminished bases, no accessory muscle use, shallow inspiratory effort Cardiovascular: tachy, regular, ext warm Abdomen: protuberant, tympanic to percussion, no clear peritoneal signs but did just get dialudid Extremities: warm, no edema Neuro: moves all 4 ext to command Psych: RASS -1, AOx3  Resolved Hospital Problem list   N/A  Assessment & Plan:  New hypotension, tachycardia,  worsening O2 need, upper back pain after enema- initial KUB no obvious perf, lactate pending Recurrent provoked VTE on AC- trop flat on admit; echo with some RV dilation but mostly preserved Recent lumbar back surgery Postop colonic ileus- ongoing issue Postop anemia slowly worsening  - Repeat KUB, limited echo - Lactate, CBC, trop - Continue heparin for now - Low threshold to repeat CT A/P - Low threshold for decompressive NGT - O2 titrated to sats 90% - GI to evaluate refractory constipation/ colonic ileus - Will follow  Best Practice (right click and "Reselect all SmartList Selections" daily)   Diet/type: NPO DVT prophylaxis: systemic heparin GI prophylaxis: PPI Lines: N/A Foley:  N/A Code Status:  full code Last date of multidisciplinary goals of care discussion [pending]  Labs   CBC: Recent Labs  Lab 04/08/22 0818 04/09/22 0234 04/10/22 0240 04/10/22 1227  WBC 12.1* 10.4 8.9  --   NEUTROABS 8.9*  --   --   --   HGB 10.4* 9.4* 8.5* 8.8*  HCT 31.5* 29.2* 26.6* 27.4*  MCV 93.5 97.3 95.3  --   PLT 184 188 187  --     Basic Metabolic Panel: Recent Labs  Lab 04/08/22 0818  NA 133*  K 3.7  CL 95*  CO2 27  GLUCOSE 178*  BUN 18  CREATININE 1.41*  CALCIUM 9.4   GFR: Estimated Creatinine Clearance: 65.7 mL/min (A) (by C-G formula based on SCr of 1.41 mg/dL (H)). Recent Labs  Lab 04/08/22 0818 04/08/22 1021 04/09/22 0234 04/10/22 0240  WBC 12.1*  --  10.4 8.9  LATICACIDVEN 2.1* 2.3*  --   --  Liver Function Tests: No results for input(s): "AST", "ALT", "ALKPHOS", "BILITOT", "PROT", "ALBUMIN" in the last 168 hours. Recent Labs  Lab 04/08/22 0818  LIPASE 42   No results for input(s): "AMMONIA" in the last 168 hours.  ABG No results found for: "PHART", "PCO2ART", "PO2ART", "HCO3", "TCO2", "ACIDBASEDEF", "O2SAT"   Coagulation Profile: Recent Labs  Lab 04/08/22 1021  INR 1.1    Cardiac Enzymes: No results for input(s): "CKTOTAL", "CKMB",  "CKMBINDEX", "TROPONINI" in the last 168 hours.  HbA1C: No results found for: "HGBA1C"  CBG: No results for input(s): "GLUCAP" in the last 168 hours.  Review of Systems:    Positive Symptoms in bold:  Constitutional fevers, chills, weight loss, fatigue, anorexia, malaise  Eyes decreased vision, double vision, eye irritation  Ears, Nose, Mouth, Throat sore throat, trouble swallowing, sinus congestion  Cardiovascular chest pain, paroxysmal nocturnal dyspnea, lower ext edema, palpitations   Respiratory SOB, cough, DOE, hemoptysis, wheezing  Gastrointestinal nausea, vomiting, diarrhea  Genitourinary burning with urination, trouble urinating  Musculoskeletal joint aches, joint swelling, back pain  Integumentary  rashes, skin lesions  Neurological focal weakness, focal numbness, trouble speaking, headaches  Psychiatric depression, anxiety, confusion  Endocrine polyuria, polydipsia, cold intolerance, heat intolerance  Hematologic abnormal bruising, abnormal bleeding, unexplained nose bleeds  Allergic/Immunologic recurrent infections, hives, swollen lymph nodes     Past Medical History:  He,  has a past medical history of Allergy, Arthritis, CTS (carpal tunnel syndrome), DJD (degenerative joint disease), DM2 (diabetes mellitus, type 2) ([place]), DVT (deep venous thrombosis) ([place]), GERD (gastroesophageal reflux disease), Gout, History of adenomatous polyps of colon (12/29/2019), History of nuclear stress test, Hyperlipidemia, Hypothyroidism, Nicotine addiction, OSA (obstructive sleep apnea), Prostatitis, Reactive airway disease, Sarcocystosis, Sinus arrhythmia, Sleep apnea, and Tinea corporis.   Surgical History:   Past Surgical History:  Procedure Laterality Date   BILATERAL KNEE ARTHROSCOPY     right- 1997; left 2001   COLONOSCOPY  multiple last 2013   KNEE SURGERY Right 2014   torn R medial meniscus- arthroscopy done   PARATHYROIDECTOMY  2005   ROTATOR CUFF REPAIR  2004   right    TONSILLECTOMY     TOTAL HIP ARTHROPLASTY Left 2009   TOTAL HIP ARTHROPLASTY Right 2006   UPPER GASTROINTESTINAL ENDOSCOPY       Social History:   reports that he has quit smoking. His smoking use included cigarettes. He smoked an average of .5 packs per day. He has never used smokeless tobacco. He reports current alcohol use of about 21.0 standard drinks of alcohol per week. He reports that he does not use drugs.   Family History:  His family history includes Diabetes in his father and paternal grandmother; Heart disease in his father; Stroke in his mother. There is no history of Colon cancer, Stomach cancer, Esophageal cancer, Rectal cancer, or Prostate cancer.   Allergies Allergies  Allergen Reactions   Bee Venom     Other reaction(s): Unknown   Lisinopril     Other reaction(s): Unknown   Metoclopramide Other (See Comments)    Other reaction(s): shaking too bad "Sweat like crazy"    Penicillins Hives    Did it involve swelling of the face/tongue/throat, SOB, or low BP? Yes Did it involve sudden or severe rash/hives, skin peeling, or any reaction on the inside of your mouth or nose? Yes Did you need to seek medical attention at a hospital or doctor's office? No When did it last happen?  childhood      If all  above answers are "NO", may proceed with cephalosporin use.     Shellfish Allergy Swelling     Home Medications  Prior to Admission medications   Medication Sig Start Date End Date Taking? Authorizing Provider  albuterol (VENTOLIN HFA) 108 (90 Base) MCG/ACT inhaler Inhale 2 puffs into the lungs every 6 (six) hours as needed for wheezing or shortness of breath. 03/07/21  Yes [provider]  allopurinol (ZYLOPRIM) 300 MG tablet Take 300 mg by mouth at bedtime. 06/30/11  Yes [provider]  CRESTOR 20 MG tablet Take 20 mg by mouth daily.  06/30/11  Yes [provider]  cyclobenzaprine (FLEXERIL) 10 MG tablet Take 10 mg by mouth 3 (three) times  daily as needed for muscle spasms. 07/18/21  Yes [provider]  enoxaparin (LOVENOX) 40 MG/0.4ML injection Inject 40 mg into the skin daily. 04/04/22  Yes [provider]  fluticasone (FLONASE) 50 MCG/ACT nasal spray INSTILL 2 SPRAYS IN EACH NOSTRIL DAILY 12/13/21  Yes [provider]  HYDROcodone-acetaminophen (NORCO/VICODIN) 5-325 MG tablet Take 1 tablet by mouth 2 (two) times daily as needed for moderate pain or severe pain. 02/05/22  Yes [provider]  levothyroxine (SYNTHROID, LEVOTHROID) 50 MCG tablet Take 50 mcg by mouth daily. 02/18/15  Yes [provider]  omeprazole (PRILOSEC) 40 MG capsule Take 40 mg by mouth 2 (two) times daily. 01/31/21  Yes [provider]  oxyCODONE-acetaminophen (PERCOCET) 10-325 MG tablet Take 1 tablet by mouth every 4 (four) hours as needed for pain. 04/05/22 05/05/22 Yes [provider]  Probiotic Product (ALIGN) 4 MG CAPS Take 1 capsule by mouth daily.   Yes [provider]     Critical care time: 35 mins    Addendum: lactate is 4.5; Cannot lie flat Intubate, NGT to LIS Stat CT A/P  Erskine Emery MD PCCM

## 2022-04-10 NOTE — Progress Notes (Signed)
This RN began to give SMOG enema at approximately 1100. At approximately 1115, patient started to complain about chest tightness and abdominal pain. Patient tachycardic in the 140s-150s, O2 88% on RA. SMOG enema stopped. This RN placed patient in upright position, patient placed on 2L O2.  EKG obtained. Prudence Davidson notified and at bedside. Chest pain subsided at this time. See new orders.

## 2022-04-10 NOTE — Progress Notes (Signed)
  Echocardiogram 2D Echocardiogram has been performed.  Kyle Delacruz 04/10/2022, 3:32 PM

## 2022-04-10 NOTE — Anesthesia Preprocedure Evaluation (Signed)
Anesthesia Evaluation  Patient identified by MRN, date of birth, ID band Patient unresponsive    Reviewed: Allergy & Precautions, H&P , NPO status , Patient's Chart, lab work & pertinent test results  Airway Mallampati: Intubated       Dental no notable dental hx. (+) Teeth Intact, Dental Advisory Given   Pulmonary sleep apnea , COPD, former smoker, PE   Pulmonary exam normal breath sounds clear to auscultation       Cardiovascular hypertension,  Rhythm:Regular Rate:Normal     Neuro/Psych negative neurological ROS  negative psych ROS   GI/Hepatic Neg liver ROS,GERD  Medicated,,  Endo/Other  diabetesHypothyroidism  Morbid obesity  Renal/GU negative Renal ROS  negative genitourinary   Musculoskeletal  (+) Arthritis , Osteoarthritis,    Abdominal   Peds  Hematology  (+) Blood dyscrasia, anemia   Anesthesia Other Findings   Reproductive/Obstetrics negative OB ROS                             Anesthesia Physical Anesthesia Plan  ASA: 4 and emergent  Anesthesia Plan: General   Post-op Pain Management:    Induction: Intravenous  PONV Risk Score and Plan: 2  Airway Management Planned: Oral ETT  Additional Equipment:   Intra-op Plan:   Post-operative Plan: Post-operative intubation/ventilation  Informed Consent: I have reviewed the patients History and Physical, chart, labs and discussed the procedure including the risks, benefits and alternatives for the proposed anesthesia with the patient or authorized representative who has indicated his/her understanding and acceptance.     Dental advisory given  Plan Discussed with: CRNA  Anesthesia Plan Comments:        Anesthesia Quick Evaluation

## 2022-04-10 NOTE — Progress Notes (Signed)
ANTICOAGULATION CONSULT NOTE - Follow Up Consult  Pharmacy Consult for Heparin Indication: pulmonary embolus and DVT  Allergies  Allergen Reactions   Bee Venom     Other reaction(s): Unknown   Lisinopril     Other reaction(s): Unknown   Metoclopramide Other (See Comments)    Other reaction(s): shaking too bad "Sweat like crazy"    Penicillins Hives    Did it involve swelling of the face/tongue/throat, SOB, or low BP? Yes Did it involve sudden or severe rash/hives, skin peeling, or any reaction on the inside of your mouth or nose? Yes Did you need to seek medical attention at a hospital or doctor's office? No When did it last happen?  childhood      If all above answers are "NO", may proceed with cephalosporin use.     Shellfish Allergy Swelling    Patient Measurements: Height: 5' 9.02" (175.3 cm) Weight: 114.6 kg (252 lb 10.4 oz) IBW/kg (Calculated) : 70.74 Heparin Dosing Weight: 94 kg  Vital Signs: Temp: 98 F (36.7 C) (11/28 0300) Temp Source: Oral (11/28 0300) BP: 117/70 (11/28 0200) Pulse Rate: 91 (11/28 0200)  Labs: Recent Labs    04/08/22 0818 04/08/22 1021 04/08/22 1132 04/08/22 1314 04/08/22 1859 04/09/22 0140 04/09/22 0234 04/10/22 0240  HGB 10.4*  --   --   --   --   --  9.4* 8.5*  HCT 31.5*  --   --   --   --   --  29.2* 26.6*  PLT 184  --   --   --   --   --  188 187  APTT  --  29  --   --   --   --   --   --   LABPROT  --  13.9  --   --   --   --   --   --   INR  --  1.1  --   --   --   --   --   --   HEPARINUNFRC  --   --    < >  --  0.57 0.65  --  0.73*  CREATININE 1.41*  --   --   --   --   --   --   --   TROPONINIHS  --  8  --  11  --   --   --   --    < > = values in this interval not displayed.     Estimated Creatinine Clearance: 66.1 mL/min (A) (by C-G formula based on SCr of 1.41 mg/dL (H)).   Medications:  Infusions:   heparin 1,500 Units/hr (04/10/22 0000)    Assessment: 39 yoM presented on 11/26 for cough, SOB, abdominal  pain.  PMH significant for DVT/PE after previous cervical spine surgery 07/17/21 and he completed a course of apixaban in August.  He had a recent L2-L5 fusion on 04/04/22 and had a postop plan to be on Lovenox '40mg'$  (filled 04/04/22 x7 days).  Today, CTa is positive for acute PE with right heart strain and postop ileus.  Dopplers are positive for DVT in RLE.   04/10/22 Heparin level 0.73, supra-therapeutic on heparin at 1500 units/hr  Hgb low 8.5, Pltc wnl No bleeding or complications reported.   Goal of Therapy:  Heparin level 0.3-0.7 units/ml Monitor platelets by anticoagulation protocol: Yes   Plan:  Decrease heparin drip to 1400 units/hr Heparin level in 6 hours Daily heparin level and CBC  Dolly Rias RPh 04/10/2022, 4:30 AM

## 2022-04-11 ENCOUNTER — Encounter (HOSPITAL_COMMUNITY): Payer: Self-pay | Admitting: Surgery

## 2022-04-11 DIAGNOSIS — I2699 Other pulmonary embolism without acute cor pulmonale: Secondary | ICD-10-CM | POA: Diagnosis not present

## 2022-04-11 LAB — BPAM FFP
Blood Product Expiration Date: 202312022359
Blood Product Expiration Date: 202312032359
Blood Product Expiration Date: 202312032359
Blood Product Expiration Date: 202312032359
Blood Product Expiration Date: 202312032359
Blood Product Expiration Date: 202312032359
Blood Product Expiration Date: 202312062359
Blood Product Expiration Date: 202312062359
ISSUE DATE / TIME: 202311281840
ISSUE DATE / TIME: 202311281840
ISSUE DATE / TIME: 202311281840
ISSUE DATE / TIME: 202311282102
Unit Type and Rh: 600
Unit Type and Rh: 6200
Unit Type and Rh: 6200
Unit Type and Rh: 6200
Unit Type and Rh: 6200
Unit Type and Rh: 6200
Unit Type and Rh: 6200
Unit Type and Rh: 6200

## 2022-04-11 LAB — BASIC METABOLIC PANEL
Anion gap: 9 (ref 5–15)
BUN: 18 mg/dL (ref 8–23)
CO2: 24 mmol/L (ref 22–32)
Calcium: 7.6 mg/dL — ABNORMAL LOW (ref 8.9–10.3)
Chloride: 101 mmol/L (ref 98–111)
Creatinine, Ser: 1.57 mg/dL — ABNORMAL HIGH (ref 0.61–1.24)
GFR, Estimated: 49 mL/min — ABNORMAL LOW (ref >60)
Glucose, Bld: 204 mg/dL — ABNORMAL HIGH (ref 70–99)
Potassium: 3.8 mmol/L (ref 3.5–5.1)
Sodium: 134 mmol/L — ABNORMAL LOW (ref 135–145)

## 2022-04-11 LAB — BLOOD GAS, ARTERIAL
Acid-Base Excess: 3 mmol/L — ABNORMAL HIGH (ref 0.0–2.0)
Acid-Base Excess: 3.9 mmol/L — ABNORMAL HIGH (ref 0.0–2.0)
Bicarbonate: 24.2 mmol/L (ref 20.0–28.0)
Bicarbonate: 27.7 mmol/L (ref 20.0–28.0)
Drawn by: 560031
FIO2: 40 %
MECHVT: 580 mL
O2 Saturation: 99.7 %
O2 Saturation: 99.5 %
PEEP: 5 cmH2O
Patient temperature: 37
Patient temperature: 37
RATE: 22 resp/min
pCO2 arterial: 27 mmHg — ABNORMAL LOW (ref 32–48)
pCO2 arterial: 38 mmHg (ref 32–48)
pH, Arterial: 7.56 — ABNORMAL HIGH (ref 7.35–7.45)
pH, Arterial: 7.47 — ABNORMAL HIGH (ref 7.35–7.45)
pO2, Arterial: 97 mmHg (ref 83–108)
pO2, Arterial: 115 mmHg — ABNORMAL HIGH (ref 83–108)

## 2022-04-11 LAB — MAGNESIUM: Magnesium: 1.6 mg/dL — ABNORMAL LOW (ref 1.7–2.4)

## 2022-04-11 LAB — HEMOGLOBIN AND HEMATOCRIT, BLOOD
HCT: 30.7 % — ABNORMAL LOW (ref 39.0–52.0)
HCT: 29.9 % — ABNORMAL LOW (ref 39.0–52.0)
HCT: 31.1 % — ABNORMAL LOW (ref 39.0–52.0)
Hemoglobin: 10.7 g/dL — ABNORMAL LOW (ref 13.0–17.0)
Hemoglobin: 10.8 g/dL — ABNORMAL LOW (ref 13.0–17.0)
Hemoglobin: 10.9 g/dL — ABNORMAL LOW (ref 13.0–17.0)

## 2022-04-11 LAB — GLUCOSE, CAPILLARY
Glucose-Capillary: 142 mg/dL — ABNORMAL HIGH (ref 70–99)
Glucose-Capillary: 142 mg/dL — ABNORMAL HIGH (ref 70–99)
Glucose-Capillary: 144 mg/dL — ABNORMAL HIGH (ref 70–99)
Glucose-Capillary: 154 mg/dL — ABNORMAL HIGH (ref 70–99)
Glucose-Capillary: 164 mg/dL — ABNORMAL HIGH (ref 70–99)
Glucose-Capillary: 194 mg/dL — ABNORMAL HIGH (ref 70–99)

## 2022-04-11 LAB — PREPARE FRESH FROZEN PLASMA
Unit division: 0
Unit division: 0
Unit division: 0
Unit division: 0
Unit division: 0
Unit division: 0
Unit division: 0

## 2022-04-11 LAB — CBC
HCT: 30.7 % — ABNORMAL LOW (ref 39.0–52.0)
Hemoglobin: 11.1 g/dL — ABNORMAL LOW (ref 13.0–17.0)
MCH: 30.2 pg (ref 26.0–34.0)
MCHC: 36.2 g/dL — ABNORMAL HIGH (ref 30.0–36.0)
MCV: 83.7 fL (ref 80.0–100.0)
Platelets: 125 10*3/uL — ABNORMAL LOW (ref 150–400)
RBC: 3.67 MIL/uL — ABNORMAL LOW (ref 4.22–5.81)
RDW: 16.1 % — ABNORMAL HIGH (ref 11.5–15.5)
WBC: 11 10*3/uL — ABNORMAL HIGH (ref 4.0–10.5)
nRBC: 0.4 % — ABNORMAL HIGH (ref 0.0–0.2)

## 2022-04-11 LAB — HEPARIN LEVEL (UNFRACTIONATED): Heparin Unfractionated: 0.27 IU/mL — ABNORMAL LOW (ref 0.30–0.70)

## 2022-04-11 MED ORDER — VASOPRESSIN 20 UNITS/100 ML INFUSION FOR SHOCK
0.0000 [IU]/min | INTRAVENOUS | Status: DC
  Administered 2022-04-11 (×3): 0.04 [IU]/min via INTRAVENOUS
  Administered 2022-04-12 – 2022-04-13 (×3): 0.03 [IU]/min via INTRAVENOUS
  Filled 2022-04-11 (×6): qty 100

## 2022-04-11 MED ORDER — NOREPINEPHRINE 4 MG/250ML-% IV SOLN
0.0000 ug/min | INTRAVENOUS | Status: DC
  Administered 2022-04-11: 6 ug/min via INTRAVENOUS
  Administered 2022-04-12 – 2022-04-13 (×2): 4 ug/min via INTRAVENOUS
  Filled 2022-04-11 (×3): qty 250

## 2022-04-11 MED ORDER — INSULIN ASPART 100 UNIT/ML IJ SOLN
0.0000 [IU] | INTRAMUSCULAR | Status: DC
  Administered 2022-04-11 (×2): 1 [IU] via SUBCUTANEOUS
  Administered 2022-04-11 (×2): 2 [IU] via SUBCUTANEOUS
  Administered 2022-04-11 – 2022-04-22 (×5): 1 [IU] via SUBCUTANEOUS

## 2022-04-11 MED ORDER — ORAL CARE MOUTH RINSE: 15.0000 mL | OROMUCOSAL | Status: DC | PRN

## 2022-04-11 MED ORDER — MAGNESIUM SULFATE 4 GM/100ML IV SOLN
4.0000 g | Freq: Once | INTRAVENOUS | Status: AC
  Administered 2022-04-11: 4 g via INTRAVENOUS
  Filled 2022-04-11: qty 100

## 2022-04-11 MED ORDER — ORAL CARE MOUTH RINSE
15.0000 mL | OROMUCOSAL | Status: DC
  Administered 2022-04-11 – 2022-04-13 (×26): 15 mL via OROMUCOSAL

## 2022-04-11 MED ORDER — HEPARIN (PORCINE) 25000 UT/250ML-% IV SOLN
1600.0000 [IU]/h | INTRAVENOUS | Status: DC
  Administered 2022-04-11: 1400 [IU]/h via INTRAVENOUS
  Administered 2022-04-12 (×2): 1600 [IU]/h via INTRAVENOUS
  Filled 2022-04-11 (×3): qty 250

## 2022-04-11 MED ORDER — ROSUVASTATIN CALCIUM 20 MG PO TABS: 20.0000 mg | ORAL_TABLET | Freq: Every day | ORAL | Status: DC

## 2022-04-11 MED ORDER — LEVOTHYROXINE SODIUM 50 MCG PO TABS
50.0000 ug | ORAL_TABLET | Freq: Every day | ORAL | Status: DC
  Administered 2022-04-11: 50 ug
  Filled 2022-04-11: qty 1

## 2022-04-11 MED ORDER — DEXMEDETOMIDINE HCL IN NACL 400 MCG/100ML IV SOLN
0.4000 ug/kg/h | INTRAVENOUS | Status: DC
  Administered 2022-04-11 (×2): 0.4 ug/kg/h via INTRAVENOUS
  Administered 2022-04-12 (×2): 0.6 ug/kg/h via INTRAVENOUS
  Filled 2022-04-11 (×4): qty 100

## 2022-04-11 MED ORDER — ACETAMINOPHEN 160 MG/5ML PO SOLN
650.0000 mg | Freq: Four times a day (QID) | ORAL | Status: DC | PRN
  Administered 2022-04-11: 650 mg via ORAL
  Filled 2022-04-11: qty 20.3

## 2022-04-11 MED ORDER — ACETAMINOPHEN 10 MG/ML IV SOLN
1000.0000 mg | Freq: Once | INTRAVENOUS | Status: AC
  Administered 2022-04-11: 1000 mg via INTRAVENOUS
  Filled 2022-04-11: qty 100

## 2022-04-11 MED FILL — Medication: Qty: 1 | Status: AC

## 2022-04-11 NOTE — Progress Notes (Addendum)
[facility] Progress Note  1 Day Post-Op  Subjective: CC:  Intubated, sedated. Wife at bedside.   TMAX 101.3 vent on 40% and 5  Still on levo and vaso   Objective: Vital signs in last 24 hours: Temp:  [96.1 F (35.6 C)-101.3 F (38.5 C)] 101.3 F (38.5 C) (11/29 0800) Pulse Rate:  [73-150] 87 (11/29 0800) Resp:  [17-32] 22 (11/29 0800) BP: (50-165)/(35-137) 142/93 (11/29 0800) SpO2:  [87 %-100 %] 100 % (11/29 0800) Arterial Line BP: (87-156)/(46-83) 142/81 (11/29 0800) FiO2 (%):  [40 %-100 %] 40 % (11/29 0740) Weight:  [111.6 kg] 111.6 kg (11/29 0500) Last BM Date : 04/09/22  Intake/Output from previous day: 11/28 0701 - 11/29 0700 In: 8415.6 [I.V.:2837.5; Blood:3382.1; IV LPFXTKWIO:9735] Out: 3299 [Urine:700; Emesis/NG output:200; Stool:175; Blood:2500] Intake/Output this shift: No intake/output data recorded.  PE: Gen:  intubated, sedated  Card:  Regular rate and rhythm Pulm:  ventilated respirations  Abd: Soft, midline incision c/d/I with honeycomb in place, end ileostomy edematous and viable, small amt liquid brown/green stool in pouch. GU: clear, dark yellow urine  Skin: warm and dry, no rashes  Psych: A&Ox3    Lab Results:  Recent Labs    04/10/22 0240 04/10/22 1227 04/10/22 2355 04/11/22 0435  WBC 8.9  --   --  11.0*  HGB 8.5*   < > 10.7* 11.1*  HCT 26.6*   < > 30.7* 30.7*  PLT 187  --   --  125*   < > = values in this interval not displayed.   BMET Recent Labs    04/10/22 1430 04/10/22 2101 04/10/22 2213 04/11/22 0435  NA 135   < > 133* 134*  K 3.6   < > 4.0 3.8  CL 96*  --   --  101  CO2 26  --   --  24  GLUCOSE 199*  --   --  204*  BUN 14  --   --  18  CREATININE 1.49*  --   --  1.57*  CALCIUM 8.5*  --   --  7.6*   < > = values in this interval not displayed.   PT/INR Recent Labs    04/08/22 1021  LABPROT 13.9  INR 1.1   CMP     Component Value Date/Time   NA 134 (L) 04/11/2022 0435   K 3.8 04/11/2022 0435    CL 101 04/11/2022 0435   CO2 24 04/11/2022 0435   GLUCOSE 204 (H) 04/11/2022 0435   BUN 18 04/11/2022 0435   CREATININE 1.57 (H) 04/11/2022 0435   CALCIUM 7.6 (L) 04/11/2022 0435   PROT 6.3 (L) 07/27/2021 0556   ALBUMIN 2.9 (L) 07/27/2021 0556   AST 14 (L) 07/27/2021 0556   ALT 13 07/27/2021 0556   ALKPHOS 50 07/27/2021 0556   BILITOT 1.3 (H) 07/27/2021 0556   GFRNONAA 49 (L) 04/11/2022 0435   GFRAA >60 08/19/2019 1417   Lipase     Component Value Date/Time   LIPASE 42 04/08/2022 0818       Studies/Results: CT ABDOMEN PELVIS WO CONTRAST  Result Date: 04/10/2022 CLINICAL DATA:  Abdominal distension and abdominal pain. EXAM: CT ABDOMEN AND PELVIS WITHOUT CONTRAST TECHNIQUE: Multidetector CT imaging of the abdomen and pelvis was performed following the standard protocol without IV contrast. RADIATION DOSE REDUCTION: This exam was performed according to the departmental dose-optimization program which includes automated exposure control, adjustment of the mA and/or kV according to patient size and/or use of iterative  reconstruction technique. COMPARISON:  April 08, 2022. FINDINGS: Lower chest: Basilar volume loss and more confluent airspace disease noted on the RIGHT as compared to the LEFT. Small RIGHT-sided pleural effusion. Signs of coronary artery disease. Central venous access device terminates at the caval to atrial junction. No chest wall abnormality. Hepatobiliary: Smooth hepatic contours. There is intermediate density fluid at 34 Hounsfield units along the RIGHT hemiliver, some areas slightly higher. This hemoperitoneum is most pronounced in the RIGHT upper quadrant. Abundant stranding adjacent to the RIGHT hemicolon. No gross pericholecystic stranding. Sludge or blood in the gallbladder. Pancreas: Pancreas with normal contours, no signs of inflammation or peripancreatic fluid. Spleen: Spleen normal size and contour. Adrenals/Urinary Tract: Adrenal glands are normal. Renal  cortical scarring bilaterally and signs of renal calculi without change. No signs of hydronephrosis. Urinary bladder is collapsed and difficult to evaluate given the presence of streak artifact from bilateral hip arthroplasty changes. Stomach/Bowel: Distension of the stomach. Gastric tube appears outside of the wall of the stomach on axials and coronal is a. sagittal imaging is equivocal. There is fluid adjacent to the stomach in the LEFT upper quadrant in the subdiaphragmatic space. The most abnormal finding on the current study is however a large hematoma with hematocrit in the RIGHT upper quadrant which displaces the colon anteriorly (image 62/4 and image 60/5. This measures 10.0 x 9.7 x 12.5 cm. Blood tracks over the RIGHT hemiliver as discussed. Blood appears confined to the anterior pararenal space and then tracks into the peritoneum along the margin of the RIGHT hemiliver. Colon remains distended with partially formed stool and is displaced anteriorly and medially by the large RIGHT abdominal hematoma which is likely centered in the anterior pararenal space and proximal transverse mesocolon adjacent to the hepatic flexure. There is also interval development of moderate fluid elsewhere that measures lower density but is a stents of Leigh related. Moderate fluid tracks into the pelvis. No sign of small bowel dilation. Vascular/Lymphatic: Normal caliber of the abdominal aorta. Calcified aortic atherosclerotic changes. No retroperitoneal hematoma adjacent to vascular structures. No gross intramuscular hematoma in the retroperitoneum in this patient on anticoagulation. Reproductive: Not well evaluated but grossly unremarkable by CT. Other: Hemoperitoneum and anterior pararenal/mesocolon hematoma. Musculoskeletal: No acute musculoskeletal findings. Spinal fusion extending from L2 through L5 as on previous imaging. Bilateral hip arthroplasty changes. Stranding in the body wall likely reflects developing anasarca.  IMPRESSION: 1. Large hematoma with hematocrit in the RIGHT upper quadrant which displaces the colon anteriorly and medially. This is likely centered in the anterior pararenal space and then tracks into the peritoneum along the margin of the RIGHT hemiliver with moderately large amount of blood over the RIGHT hemiliver and along the RIGHT hepatic margin. Would be difficult to exclude bleeding from the liver as there is some wave like added density along the inferior margin of the RIGHT hemiliver though the process appears to be centered more within the anterior pararenal space and mesocolon displacing the colon anteriorly. Given displacement and large size of the pericolonic hematoma vascular compromise as a result of mass effect is a concern. 2. Gastric tube appears outside of the wall of the stomach on axials and coronal, also with an abnormal appearance on sagittal imaging though assessment limited by adjacent fluid in the LEFT subdiaphragmatic space. Given potential extraluminal position the possibility of developing generalized ischemia is considered. Correlate with lactate. Suggest administration of water soluble positive oral contrast through the patient's gastric tube with CT imaging of the abdomen if surgical  exploration is not undertaken to exclude concomitant gastric compromise. 3. Basilar volume loss and more confluent airspace disease noted on the RIGHT as compared to the LEFT. Findings may represent atelectasis or developing pneumonia due to aspiration, attention on follow-up 4. Small RIGHT-sided pleural effusion. 5. Signs of coronary artery disease. 6. Renal cortical scarring bilaterally and signs of renal calculi without change. 7. Aortic atherosclerosis. Aortic Atherosclerosis (ICD10-I70.0). Critical Value/emergent results were called by telephone at the time of interpretation on 04/10/2022 at 6:35 pm to provider [facility] , who verbally acknowledged these results. Electronically Signed   By:  Zetta Bills M.D.   On: 04/10/2022 18:36   ECHOCARDIOGRAM LIMITED  Result Date: 04/10/2022    ECHOCARDIOGRAM LIMITED REPORT   Patient Name:   Kyle Delacruz [facility] Date of Exam: 04/10/2022 Medical Rec #:  [id number]       Height:       69.0 in Accession #:    [id number]      Weight:       250.2 lb Date of Birth:  05-27-1955       BSA:          2.272 m Patient Age:    17 years        BP:           113/69 mmHg Patient Gender: M               HR:           138 bpm. Exam Location:  Inpatient Procedure: Limited Echo STAT ECHO Indications:    Pulmonary embolus  History:        Patient has prior history of Echocardiogram examinations, most                 recent 04/09/2022. Abnormal ECG, COPD, Signs/Symptoms:Dyspnea                 and Shortness of Breath; Risk Factors:Hypertension,                 Dyslipidemia, Current Smoker and Sleep Apnea.  Sonographer:    Eartha Inch Referring Phys: [id number] Candee Furbish  Sonographer Comments: Technically challenging study due to limited acoustic windows. Image acquisition challenging due to patient body habitus.  Comparison(s): Very brief, limited 2D only study for right ventricular assessment in a patient with pulmonary embolism. Apical image is very poor. Based on parasternal images, the right ventricle does not appear to be dilated or severely depressed. Sanda Klein MD Electronically signed by Sanda Klein MD Signature Date/Time: 04/10/2022/4:52:57 PM    Final    Portable Chest x-ray  Result Date: 04/10/2022 CLINICAL DATA:  Intubation EXAM: PORTABLE CHEST 1 VIEW COMPARISON:  08/19/2019 FINDINGS: Endotracheal tube in good position. Left jugular central venous catheter tip in the SVC. NG coiled in the stomach. Hypoventilation with bibasilar atelectasis. Chronic elevation right hemidiaphragm unchanged. No significant pleural fluid. No pneumothorax. IMPRESSION: 1. Endotracheal tube in good position. 2. Hypoventilation with bibasilar atelectasis. 3. Central venous  catheter tip in the SVC.  No pneumothorax. Electronically Signed   By: Franchot Gallo M.D.   On: 04/10/2022 16:51   DG Abd Portable 1V  Result Date: 04/10/2022 CLINICAL DATA:  [id number] Abdominal pain [id number] EXAM: PORTABLE ABDOMEN - 1 VIEW COMPARISON:  CT abdomen pelvis 04/08/2022 FINDINGS: Persistent marked gaseous dilatation of the colon (up to 12 cm on radiograph). Limited evaluation for free gas on this supine radiograph. No definite radiographic configuration to suggest volvulus. No radio-opaque  calculi or other significant radiographic abnormality are seen. Lumbar surgical hardware and bilateral hip total arthroplasty. IMPRESSION: Grossly stable with persistent marked gaseous dilatation of the colon. Finding better evaluated on CT abdomen 04/08/2022. Electronically Signed   By: Iven Finn M.D.   On: 04/10/2022 12:45   ECHOCARDIOGRAM COMPLETE  Result Date: 04/09/2022    ECHOCARDIOGRAM REPORT   Patient Name:   Kyle Delacruz [facility] Date of Exam: 04/09/2022 Medical Rec #:  [id number]       Height:       69.0 in Accession #:    [id number]      Weight:       252.6 lb Date of Birth:  May 31, 1955       BSA:          2.282 m Patient Age:    44 years        BP:           111/69 mmHg Patient Gender: M               HR:           97 bpm. Exam Location:  Inpatient Procedure: 2D Echo, Cardiac Doppler and Color Doppler Indications:    I26.02 Pulmonary embolus  History:        Patient has prior history of Echocardiogram examinations, most                 recent 07/27/2021. Abnormal ECG, COPD, Signs/Symptoms:Dyspnea and                 Shortness of Breath; Risk Factors:Hypertension, Dyslipidemia,                 Sleep Apnea and Current Smoker.  Sonographer:    Roseanna Rainbow RDCS Referring Phys: [id number] ERIC CHEN  Sonographer Comments: Technically difficult study due to poor echo windows and patient is obese. Image acquisition challenging due to patient body habitus. IMPRESSIONS  1. Left ventricular ejection fraction, by  estimation, is 60 to 65%. The left ventricle has normal function. The left ventricle has no regional wall motion abnormalities. Left ventricular diastolic parameters are consistent with Grade I diastolic dysfunction (impaired relaxation).  2. Right ventricular systolic function is normal. The right ventricular size is mildly enlarged. There is mildly elevated pulmonary artery systolic pressure. The estimated right ventricular systolic pressure is 32.2 mmHg.  3. The mitral valve is normal in structure. No evidence of mitral valve regurgitation. No evidence of mitral stenosis.  4. The aortic valve is tricuspid. Aortic valve regurgitation is not visualized. No aortic stenosis is present.  5. Aortic dilatation noted. There is mild dilatation of the ascending aorta, measuring 40 mm.  6. The inferior vena cava is normal in size with greater than 50% respiratory variability, suggesting right atrial pressure of 3 mmHg. FINDINGS  Left Ventricle: Left ventricular ejection fraction, by estimation, is 60 to 65%. The left ventricle has normal function. The left ventricle has no regional wall motion abnormalities. The left ventricular internal cavity size was normal in size. There is  no left ventricular hypertrophy. Left ventricular diastolic parameters are consistent with Grade I diastolic dysfunction (impaired relaxation). Right Ventricle: The right ventricular size is mildly enlarged. No increase in right ventricular wall thickness. Right ventricular systolic function is normal. There is mildly elevated pulmonary artery systolic pressure. The tricuspid regurgitant velocity is 2.92 m/s, and with an assumed right atrial pressure of 3 mmHg, the estimated right ventricular systolic pressure is 02.5 mmHg. Left Atrium:  Left atrial size was normal in size. Right Atrium: Right atrial size was normal in size. Pericardium: There is no evidence of pericardial effusion. Mitral Valve: The mitral valve is normal in structure. No evidence  of mitral valve regurgitation. No evidence of mitral valve stenosis. Tricuspid Valve: The tricuspid valve is normal in structure. Tricuspid valve regurgitation is trivial. Aortic Valve: The aortic valve is tricuspid. Aortic valve regurgitation is not visualized. No aortic stenosis is present. Pulmonic Valve: The pulmonic valve was normal in structure. Pulmonic valve regurgitation is not visualized. Aorta: The aortic root is normal in size and structure and aortic dilatation noted. There is mild dilatation of the ascending aorta, measuring 40 mm. Venous: The inferior vena cava is normal in size with greater than 50% respiratory variability, suggesting right atrial pressure of 3 mmHg. IAS/Shunts: No atrial level shunt detected by color flow Doppler.  LEFT VENTRICLE PLAX 2D LVIDd:         4.40 cm     Diastology LVIDs:         2.50 cm     LV e' medial:    6.64 cm/s LV PW:         1.40 cm     LV E/e' medial:  9.1 LV IVS:        1.10 cm     LV e' lateral:   8.05 cm/s LVOT diam:     2.10 cm     LV E/e' lateral: 7.5 LV SV:         53 LV SV Index:   23 LVOT Area:     3.46 cm  LV Volumes (MOD) LV vol d, MOD A2C: 62.5 ml LV vol d, MOD A4C: 70.5 ml LV vol s, MOD A2C: 20.0 ml LV vol s, MOD A4C: 20.0 ml LV SV MOD A2C:     42.5 ml LV SV MOD A4C:     70.5 ml LV SV MOD BP:      48.4 ml RIGHT VENTRICLE             IVC RV S prime:     13.20 cm/s  IVC diam: 0.90 cm TAPSE (M-mode): 1.6 cm LEFT ATRIUM             Index        RIGHT ATRIUM           Index LA diam:        3.10 cm 1.36 cm/m   RA Area:     10.80 cm LA Vol (A2C):   18.5 ml 8.11 ml/m   RA Volume:   21.70 ml  9.51 ml/m LA Vol (A4C):   27.1 ml 11.88 ml/m LA Biplane Vol: 24.2 ml 10.61 ml/m  AORTIC VALVE LVOT Vmax:   101.45 cm/s LVOT Vmean:  71.550 cm/s LVOT VTI:    0.154 m  AORTA Ao Root diam: 3.50 cm Ao Asc diam:  3.95 cm MITRAL VALVE               TRICUSPID VALVE MV Area (PHT): 4.10 cm    TR Peak grad:   34.1 mmHg MV Decel Time: 185 msec    TR Vmax:        292.00 cm/s  MV E velocity: 60.65 cm/s MV A velocity: 62.30 cm/s  SHUNTS MV E/A ratio:  0.97        Systemic VTI:  0.15 m  Systemic Diam: 2.10 cm Dalton McleanMD Electronically signed by Franki Monte Signature Date/Time: 04/09/2022/2:41:12 PM    Final    VAS [place] LOWER EXTREMITY VENOUS (DVT)  Result Date: 04/09/2022  Lower Venous DVT Study Patient Name:  Kyle Delacruz [facility]  Date of Exam:   04/09/2022 Medical Rec #: [id number]        Accession #:    [id number] Date of Birth: 08-27-1955        Patient Gender: M Patient Age:   37 years Exam Location:  [facility] Procedure:      VAS [place] LOWER EXTREMITY VENOUS (DVT) Referring Phys: RALPH NETTEY --------------------------------------------------------------------------------  Indications: Pulmonary embolism, and Edema. Other Indications: Hx of RLE DVT & PE 07/26/21 post spinal surgery. Continued                    chronic RLE DVT with improvement and reconstituted flow in                    RLEV studies performed on 10/06/21, 12/22/21, and 02/13/22.                    Recent spinal surgery on 04/04/22 now with new PE and                    occlusive RLE DVT. Limitations: Poor ultrasound/tissue interface. Comparison Study: No previous LLEV exams Performing Technologist: Jody Hill RVT, RDMS  Examination Guidelines: A complete evaluation includes B-mode imaging, spectral Doppler, color Doppler, and power Doppler as needed of all accessible portions of each vessel. Bilateral testing is considered an integral part of a complete examination. Limited examinations for reoccurring indications may be performed as noted. The reflux portion of the exam is performed with the patient in reverse Trendelenburg.  +-----+---------------+---------+-----------+----------+--------------+ RIGHTCompressibilityPhasicitySpontaneityPropertiesThrombus Aging +-----+---------------+---------+-----------+----------+--------------+ CFV  Full           Yes      Yes                                  +-----+---------------+---------+-----------+----------+--------------+   +---------+---------------+---------+-----------+----------+--------------+ LEFT     CompressibilityPhasicitySpontaneityPropertiesThrombus Aging +---------+---------------+---------+-----------+----------+--------------+ CFV      Full           Yes      Yes                                 +---------+---------------+---------+-----------+----------+--------------+ SFJ      Full                                                        +---------+---------------+---------+-----------+----------+--------------+ FV Prox  Full           Yes      Yes                                 +---------+---------------+---------+-----------+----------+--------------+ FV Mid   Full           Yes      Yes                                 +---------+---------------+---------+-----------+----------+--------------+  FV DistalFull           Yes      Yes                                 +---------+---------------+---------+-----------+----------+--------------+ PFV      Full                                                        +---------+---------------+---------+-----------+----------+--------------+ POP      Full           Yes      Yes                                 +---------+---------------+---------+-----------+----------+--------------+ PTV      Full                                                        +---------+---------------+---------+-----------+----------+--------------+ PERO     Full                                                        +---------+---------------+---------+-----------+----------+--------------+     Summary: RIGHT: - No evidence of common femoral vein obstruction.  LEFT: - There is no evidence of deep vein thrombosis in the lower extremity.  - No cystic structure found in the popliteal fossa.  *See table(s) above for measurements and  observations. Electronically signed by Monica Martinez MD on 04/09/2022 at 12:41:12 PM.    Final     Anti-infectives: Anti-infectives (From admission, onward)    Start     Dose/Rate Route Frequency Ordered Stop   04/10/22 2107  metroNIDAZOLE (FLAGYL) 500 MG/100ML IVPB       Note to Pharmacy: Enis Gash W: cabinet override      04/10/22 2107 04/10/22 2114   04/10/22 2103  ceFAZolin (ANCEF) 2-4 GM/100ML-% IVPB       Note to Pharmacy: Enis Gash W: cabinet override      04/10/22 2103 04/11/22 0914        Assessment/Plan 66 y/o M S/p lumbar spine surgery at outside hospital 04/04/22, admitted to [facility] long 11/26 after diagnosis of PE, started on heparin gtt for this, developed hypotension, and hgb dropped from 10 to 6. CT revealed hemoperitoneum.  Hemoperitoneum, colonic ileus S/P exploratory laparotomy, TAC, end ileostomy 11/28 Dr. Thermon Leyland - intra-op source of bleeding was noted to be mesentery of the hepatic flexure.  - await bowel function, hold off on tube feeds for today, will consider starting trickle TF tomorrow - WOC RN consulted for new ileostomy - hemodynamics improving post-op, appreciate CCM care.  - ok to start hep gtt today without a bolus from CCS standpoint       LOS: 3 days   I reviewed nursing notes, hospitalist notes, last 24 h vitals and pain scores, last 48 h intake and output, last 24 h  labs and trends, and last 24 h imaging results.    Obie Dredge, PA-C [facility] Please see Amion for pager number during day hours 7:00am-4:30pm

## 2022-04-11 NOTE — Progress Notes (Signed)
Pt transported from [facility] to room 1224.  Pt remained stable and comfortable for duration of transport on mechanical ventilation.

## 2022-04-11 NOTE — Progress Notes (Addendum)
ANTICOAGULATION CONSULT NOTE - Follow Up Consult  Pharmacy Consult for Heparin Indication: pulmonary embolus and DVT  Allergies  Allergen Reactions   Bee Venom     Other reaction(s): Unknown   Lisinopril     Other reaction(s): Unknown   Metoclopramide Other (See Comments)    Other reaction(s): shaking too bad "Sweat like crazy"    Penicillins Hives    Did it involve swelling of the face/tongue/throat, SOB, or low BP? Yes Did it involve sudden or severe rash/hives, skin peeling, or any reaction on the inside of your mouth or nose? Yes Did you need to seek medical attention at a hospital or doctor's office? No When did it last happen?  childhood      If all above answers are "NO", may proceed with cephalosporin use.     Shellfish Allergy Swelling    Patient Measurements: Height: '5\' 10"'$  (177.8 cm) Weight: 111.6 kg (246 lb 0.5 oz) IBW/kg (Calculated) : 73 Heparin Dosing Weight: 94 kg  Vital Signs: Temp: 101.3 F (38.5 C) (11/29 0915) Temp Source: Core (11/29 0400) BP: 95/57 (11/29 0900) Pulse Rate: 79 (11/29 0915)  Labs: Recent Labs    04/08/22 1021 04/08/22 1132 04/08/22 1314 04/08/22 1859 04/09/22 0140 04/09/22 0234 04/09/22 0234 04/10/22 0240 04/10/22 1035 04/10/22 1227 04/10/22 1428 04/10/22 1430 04/10/22 1619 04/10/22 2101 04/10/22 2213 04/10/22 2355 04/11/22 0435  HGB  --   --   --   --   --  9.4*   < > 8.5*  --    < >  --   --  6.4*   < > 8.8* 10.7* 11.1*  HCT  --   --   --   --   --  29.2*   < > 26.6*  --    < >  --   --  19.9*   < > 26.0* 30.7* 30.7*  PLT  --   --   --   --   --  188  --  187  --   --   --   --   --   --   --   --  125*  APTT 29  --   --   --   --   --   --   --   --   --   --   --   --   --   --   --   --   LABPROT 13.9  --   --   --   --   --   --   --   --   --   --   --   --   --   --   --   --   INR 1.1  --   --   --   --   --   --   --   --   --   --   --   --   --   --   --   --   HEPARINUNFRC  --    < >  --    < > 0.65  --    --  0.73* 0.59  --   --   --   --   --   --   --   --   CREATININE  --   --   --   --   --   --   --   --   --   --   --  1.49*  --   --   --   --  1.57*  TROPONINIHS 8  --  11  --   --   --   --   --   --   --  8  --  8  --   --   --   --    < > = values in this interval not displayed.     Estimated Creatinine Clearance: 59.4 mL/min (A) (by C-G formula based on SCr of 1.57 mg/dL (H)).  Assessment: 70 yoM presented on 11/26 for cough, SOB, abdominal pain.  PMH significant for DVT/PE after previous cervical spine surgery 07/17/21 and he completed a course of apixaban in August.  He had a recent L2-L5 fusion on 04/04/22 and had a postop plan to be on Lovenox '40mg'$  (filled 04/04/22 x7 days).  Today, CTa is positive for acute PE with right heart strain and postop ileus.  Dopplers are positive for DVT in RLE.   04/11/22 Heparin stopped 11/28 for hemoperitoneum, colonic ileus 11/28 to OR: exp lap. TAC, end ileostomy . Intra-op source of bleeding found to be mesentery of hepatic flexure S/p massive transfusion protocol 11/28 & 1 dose of TXA> latest Hg 10.8 & PLT 125 OK to resume heparin today at 1400 with no bolus per CCS & CCM   Goal of Therapy:  Heparin level 0.3-0.7 units/ml Monitor platelets by anticoagulation protocol: Yes   Plan:  Resume heparin drip @ 1400 units/hr at 1400 today with no bolus & check 8 hr heparin level Daily heparin level and CBC  Eudelia Bunch, Pharm.D Use secure chat for questions 04/11/2022 9:34 AM

## 2022-04-11 NOTE — Consult Note (Signed)
WOC consulted for post op ostomy needs, new end ileostomy. Critically ill, vented.  Will follow along for needs and appropriate timing for WOC education and care.   [place], [place], [place]

## 2022-04-11 NOTE — Progress Notes (Addendum)
NAME:  Kyle Delacruz, MRN:  [id number], DOB:  [birth year], LOS: 3 ADMISSION DATE:  04/08/2022, CONSULTATION DATE:  04/10/22 REFERRING MD:  Lonny Prude, CHIEF COMPLAINT:  abd pain   History of Present Illness:  66 year old man with hx of provoked DVT who presents after lumbar surgery with worsening SOB and RLE swelling.  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  Echo/CTA showing some RV strain but nothing alarming.  Hospital course complicated by severe constipation vs. Colonic ileus partially relieved with enema 04/10/22.  He developed searing epigastric pain with radiation to shoulder, and worsened with movement after enema.    He subsequently developed worsening hypoxia, tachycardia and hypotension requiring intubation on 11/28.  CT results showed a large pericolonic hematoma tracking into the peritoneum and right hemiliver.  Mass transfusion protocol was activated and he was evaluated by General Surgery who took him for an emergency ex lap with total colectomy and end ileostomy.  Pertinent  Medical History  Recurrent VTE DM2 HLD Hypothyroidism OSA  Significant Hospital Events: Including procedures, antibiotic start and stop dates in addition to other pertinent events   11/26 Admit 11/28 PCCM consult, intubated, emergency ex-lap, requiring pressors  Interim History / Subjective:  Tmax 101.3/ WBC 11 FiO2 40%, PEEP 5 Remains on Levophed 5, Vasopressin 0.2 CCS ok with heparin, no bolus. No TPN  I/O UOP 700 ml, 2.5L EBL from surgery, +4.8L in last 24h  Objective   Blood pressure 126/85, pulse 81, temperature (!) 101.3 F (38.5 C), resp. rate (!) 22, height '5\' 10"'$  (1.778 m), weight 111.6 kg, SpO2 100 %.    Vent Mode: PRVC FiO2 (%):  [40 %-100 %] 40 % Set Rate:  [20 bmp-22 bmp] 22 bmp Vt Set:  [22 mL-580 mL] 580 mL PEEP:  [5 cmH20] 5 cmH20 Plateau Pressure:  [22 cmH20-33 cmH20] 23 cmH20   Intake/Output Summary (Last 24 hours) at 04/11/2022 0719 Last data filed at 04/11/2022 0700 [place] per 24  hour  Intake 8415.64 ml  Output 3575 ml  Net 4840.64 ml   Filed Weights   04/09/22 0500 04/10/22 0400 04/11/22 0500  Weight: 114.6 kg 113.5 kg 111.6 kg    Examination: General: critically ill appearing adult male lying in bed on vent, wife at bedside  HEENT: MM pink/moist, ETT, anicteric, NGT  Neuro: sedate, arouses to voice, nods yes/no CV: s1s2 RRR, no m/r/g PULM: non-labored at rest, lungs bilaterally clear without wheezing GI: soft, non-distended, midline waffle dressing, right ostomy with green/brown output Extremities: warm/dry, no edema  Skin: no rashes or lesions  Resolved Hospital Problem list     Assessment & Plan:   Acute Hypoxic Respiratory Failure Recurrent Provoked VTE on AC Trop flat on admit; echo with some RV dilation but mostly preserved -PRVC with LTVV  -adjust rate to 14 with alkalosis, see ABG -wean PEEP / fiO2 for sats >90% -PAD protocol for RASS Goal 0 to -1  -follow intermittent CXR  -VAP prevention measures -SBT / WUA when able, hold weaning today -at risk TACO/TRALI   Hemorrhagic Shock Intra-abdominal Hematoma s/p Ex-Lap Post-op colonic ileus Acute Blood Loss Anemia Mass Transfusion protocol and emergent ex-lap with total colectomy and end ileostomy on 11/28 -wean pressors for MAP > 65 -pain control  -gastric tube per CCS -wound care/ostomy, feeding timing per CCS -appreciate general surgery  -follow I/O's  -hold all PO's until cleared by CCS  Fever -suspect post op fever, IV tylenol x1 -discontinue aline   AKI Baseline creatinine 0.9-1.1 -Trend BMP /  urinary output -Replace electrolytes as indicated -Avoid nephrotoxic agents, ensure adequate renal perfusion  Hypomagnesemia -monitor, replaced 11/29   DM II  -assess A1c  -SSI, sensitive scale  Hypothyroidism -hold home synthroid given NPO status   HLD -hold home statin   Post-op L2-5 Decompression and fusion -supportive care   At Risk Malnutrition -TF per CCS -oral  care   Best Practice (right click and "Reselect all SmartList Selections" daily)  Diet/type: NPO DVT prophylaxis: other, TED hose GI prophylaxis: PPI Lines: Central line, Arterial Line, and yes and it is still needed Foley:  Yes, and it is still needed Code Status:  full code Last date of multidisciplinary goals of care discussion: full code.  Wife updated at bedside 11/29 on plan of care  Critical care time: 34 minutes     Noe Gens, MSN, APRN, NP-C, AGACNP-BC [facility] 04/11/2022, 9:46 AM   Please see [email or web address] for pager details.   From 7A-7P if no response, please call [phone] After hours, please call ELink [phone]

## 2022-04-11 NOTE — Progress Notes (Signed)
Initial Nutrition Assessment  DOCUMENTATION CODES:   Not applicable  INTERVENTION:  - Per CCS, holding off on starting TF today but consideration for starting trickle TF tomorrow.   - Will follow for ability to start trickle feeds tomorrow.   - Once trickle feeds started and able to advance tube feeds per MD, would recommend below goal regimen (once tube placement verified in stomach) Vital 1.5 at 55 ml/h (1320 ml per day) *Once able to advance past trickle would recommend advancing 30m every 8 hours to goal Prosource TF20 60 ml TID  Provides 2220 kcal, 149 gm protein, 1008 ml free water daily  - Monitor electrolytes once starting tube feeds and replace as needed.  - Monitor weight trends.    NUTRITION DIAGNOSIS:   Inadequate oral intake related to inability to eat as evidenced by NPO status (on vent).  GOAL:   Patient will meet greater than or equal to 90% of their needs  MONITOR:   Diet advancement, Vent status, Weight trends, TF tolerance  REASON FOR ASSESSMENT:   Ventilator    ASSESSMENT:   66year old man with hx of provoked DVT, DMII, HLD, hypothyroidism who presents after lumbar surgery with worsening SOB and RLE swelling. Found to have recurrent VTE and postop ileus. 66year old man with hx of provoked DVT, DMII, HLD, hypothyroidism who presents after lumbar surgery with worsening SOB and RLE swelling. Found to have recurrent VTE and postop ileus.   11/26 admit 11/28 intubated, s/p ex lap, total colectomy with end ileostomy   Patient intubated and sedated at time of visit, in mitten restraints. 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  Per EMR, weight has been stable the past year. Weights this admission vary between 235-252# so current weight status difficult to assess at this time.   Patient noted to have colonic ileus. Per CCS, awaiting  return of bowel function. Holding off on starting TF today but consideration for starting trickle TF tomorrow.    Medications reviewed and include: Insulin, Mg replacement Fentanyl Levophed Vasopressin  Labs reviewed:  Na 134 Creatinine 1.57 Mg 1.6 *PMH DMII but no HA1C on file Blood Glucose 150-194 x12 hrs  NUTRITION - FOCUSED PHYSICAL EXAM:  Flowsheet Row Most Recent Value  Orbital Region Mild depletion  Upper Arm Region Mild depletion  Thoracic and Lumbar Region Mild depletion  Buccal Region Unable to assess  Temple Region Severe depletion  Clavicle Bone Region Mild depletion  Clavicle and Acromion Bone Region Mild depletion  Scapular Bone Region Unable to assess  Dorsal Hand Unable to assess  Patellar Region No depletion  Anterior Thigh Region No depletion  Posterior Calf Region Unable to assess  Edema (RD Assessment) Moderate  Hair Reviewed  Eyes Unable to assess  Mouth Unable to assess  Skin Reviewed  Nails Unable to assess       Diet Order:   Diet Order             Diet NPO time specified  Diet effective now                   EDUCATION NEEDS:  Not appropriate for education at this time  Skin:  Skin Assessment: Reviewed RN Assessment  Last BM:  unknown - ileostomy  Height:  Ht Readings from Last 1 Encounters:  04/10/22 '5\' 10"'$  (1.778 m)  Weight:  Wt Readings from Last 1 Encounters:  04/11/22 111.6 kg    Ideal Body Weight:  75.45 kg  BMI:  Body mass index is 35.3 kg/m.  Estimated Nutritional Needs:  Kcal:  2100-2350 kcals Protein:  135-165 grams Fluid:  >/= 2.1L    Samson Frederic RD, LDN For contact information, refer to [facility].

## 2022-04-11 NOTE — Progress Notes (Signed)
ANTICOAGULATION CONSULT NOTE - Follow Up Consult  Pharmacy Consult for Heparin Indication: pulmonary embolus and DVT  Allergies  Allergen Reactions   Bee Venom     Other reaction(s): Unknown   Lisinopril     Other reaction(s): Unknown   Metoclopramide Other (See Comments)    Other reaction(s): shaking too bad "Sweat like crazy"    Penicillins Hives    Did it involve swelling of the face/tongue/throat, SOB, or low BP? Yes Did it involve sudden or severe rash/hives, skin peeling, or any reaction on the inside of your mouth or nose? Yes Did you need to seek medical attention at a hospital or doctor's office? No When did it last happen?  childhood      If all above answers are "NO", may proceed with cephalosporin use.     Shellfish Allergy Swelling    Patient Measurements: Height: '5\' 10"'$  (177.8 cm) Weight: 111.6 kg (246 lb 0.5 oz) IBW/kg (Calculated) : 73 Heparin Dosing Weight: 94 kg  Vital Signs: Temp: 98.8 F (37.1 C) (11/29 2045) Temp Source: Bladder (11/29 2000) BP: 128/65 (11/29 2045) Pulse Rate: 65 (11/29 2045)  Labs: Recent Labs    04/09/22 0234 04/10/22 0240 04/10/22 1035 04/10/22 1227 04/10/22 1428 04/10/22 1430 04/10/22 1619 04/10/22 2101 04/11/22 0435 04/11/22 1026 04/11/22 1536 04/11/22 2130  HGB 9.4* 8.5*  --    < >  --   --  6.4*   < > 11.1* 10.8* 10.9*  --   HCT 29.2* 26.6*  --    < >  --   --  19.9*   < > 30.7* 29.9* 31.1*  --   PLT 188 187  --   --   --   --   --   --  125*  --   --   --   HEPARINUNFRC  --  0.73* 0.59  --   --   --   --   --   --   --   --  0.27*  CREATININE  --   --   --   --   --  1.49*  --   --  1.57*  --   --   --   TROPONINIHS  --   --   --   --  8  --  8  --   --   --   --   --    < > = values in this interval not displayed.     Estimated Creatinine Clearance: 59.4 mL/min (A) (by C-G formula based on SCr of 1.57 mg/dL (H)).  Assessment: 11 yoM presented on 11/26 for cough, SOB, abdominal pain.  PMH significant for  DVT/PE after previous cervical spine surgery 07/17/21 and he completed a course of apixaban in August.  He had a recent L2-L5 fusion on 04/04/22 and had a postop plan to be on Lovenox '40mg'$  (filled 04/04/22 x7 days).  Today, CTa is positive for acute PE with right heart strain and postop ileus.  Dopplers are positive for DVT in RLE.   04/11/22 Heparin stopped 11/28 for hemoperitoneum, colonic ileus 11/28 to OR: exp lap. TAC, end ileostomy . Intra-op source of bleeding found to be mesentery of hepatic flexure S/p massive transfusion protocol 11/28 & 1 dose of TXA> latest Hg 10.8 & PLT 125 OK to resume heparin today at 1400 with no bolus per CCS & CCM   2130 HL 0.27 subtherapeutic on units/hr  No bleeding reported  Goal of Therapy:  Heparin level 0.3-0.7 units/ml Monitor platelets by anticoagulation protocol: Yes   Plan:  Increase heparin drip to 1600 units/hr check 8 hr heparin level Daily heparin level and CBC  Dolly Rias RPh 04/11/2022, 10:16 PM

## 2022-04-11 NOTE — Progress Notes (Signed)
RT NOTE:  ABG obtained and sent to lab. Katie from lab notified.

## 2022-04-12 ENCOUNTER — Inpatient Hospital Stay (HOSPITAL_COMMUNITY): Payer: Medicare Other

## 2022-04-12 DIAGNOSIS — I2699 Other pulmonary embolism without acute cor pulmonale: Secondary | ICD-10-CM | POA: Diagnosis not present

## 2022-04-12 DIAGNOSIS — N179 Acute kidney failure, unspecified: Secondary | ICD-10-CM | POA: Diagnosis not present

## 2022-04-12 DIAGNOSIS — J9601 Acute respiratory failure with hypoxia: Secondary | ICD-10-CM | POA: Diagnosis not present

## 2022-04-12 DIAGNOSIS — I82411 Acute embolism and thrombosis of right femoral vein: Secondary | ICD-10-CM | POA: Diagnosis not present

## 2022-04-12 LAB — CBC
HCT: 30.5 % — ABNORMAL LOW (ref 39.0–52.0)
Hemoglobin: 10.6 g/dL — ABNORMAL LOW (ref 13.0–17.0)
MCH: 29.9 pg (ref 26.0–34.0)
MCHC: 34.8 g/dL (ref 30.0–36.0)
MCV: 85.9 fL (ref 80.0–100.0)
Platelets: 145 10*3/uL — ABNORMAL LOW (ref 150–400)
RBC: 3.55 MIL/uL — ABNORMAL LOW (ref 4.22–5.81)
RDW: 16.5 % — ABNORMAL HIGH (ref 11.5–15.5)
WBC: 14.9 10*3/uL — ABNORMAL HIGH (ref 4.0–10.5)
nRBC: 0 % (ref 0.0–0.2)

## 2022-04-12 LAB — GLUCOSE, CAPILLARY
Glucose-Capillary: 126 mg/dL — ABNORMAL HIGH (ref 70–99)
Glucose-Capillary: 97 mg/dL (ref 70–99)
Glucose-Capillary: 114 mg/dL — ABNORMAL HIGH (ref 70–99)
Glucose-Capillary: 103 mg/dL — ABNORMAL HIGH (ref 70–99)
Glucose-Capillary: 102 mg/dL — ABNORMAL HIGH (ref 70–99)
Glucose-Capillary: 130 mg/dL — ABNORMAL HIGH (ref 70–99)

## 2022-04-12 LAB — BASIC METABOLIC PANEL
Anion gap: 8 (ref 5–15)
Anion gap: 5 (ref 5–15)
BUN: 15 mg/dL (ref 8–23)
BUN: 12 mg/dL (ref 8–23)
CO2: 28 mmol/L (ref 22–32)
CO2: 30 mmol/L (ref 22–32)
Calcium: 7.4 mg/dL — ABNORMAL LOW (ref 8.9–10.3)
Calcium: 7.4 mg/dL — ABNORMAL LOW (ref 8.9–10.3)
Chloride: 95 mmol/L — ABNORMAL LOW (ref 98–111)
Chloride: 95 mmol/L — ABNORMAL LOW (ref 98–111)
Creatinine, Ser: 1.22 mg/dL (ref 0.61–1.24)
Creatinine, Ser: 1.1 mg/dL (ref 0.61–1.24)
GFR, Estimated: >60 mL/min (ref >60)
GFR, Estimated: >60 mL/min (ref >60)
Glucose, Bld: 141 mg/dL — ABNORMAL HIGH (ref 70–99)
Glucose, Bld: 121 mg/dL — ABNORMAL HIGH (ref 70–99)
Potassium: 3.1 mmol/L — ABNORMAL LOW (ref 3.5–5.1)
Potassium: 3.7 mmol/L (ref 3.5–5.1)
Sodium: 131 mmol/L — ABNORMAL LOW (ref 135–145)
Sodium: 130 mmol/L — ABNORMAL LOW (ref 135–145)

## 2022-04-12 LAB — PREPARE PLATELET PHERESIS: Unit division: 0

## 2022-04-12 LAB — HEMOGLOBIN A1C
Hgb A1c MFr Bld: 5.9 % — ABNORMAL HIGH (ref 4.8–5.6)
Mean Plasma Glucose: 123 mg/dL

## 2022-04-12 LAB — HEMOGLOBIN AND HEMATOCRIT, BLOOD
HCT: 29.9 % — ABNORMAL LOW (ref 39.0–52.0)
HCT: 29.4 % — ABNORMAL LOW (ref 39.0–52.0)
Hemoglobin: 10.2 g/dL — ABNORMAL LOW (ref 13.0–17.0)
Hemoglobin: 10 g/dL — ABNORMAL LOW (ref 13.0–17.0)

## 2022-04-12 LAB — BPAM PLATELET PHERESIS
Blood Product Expiration Date: 202312012359
Unit Type and Rh: 6200

## 2022-04-12 LAB — HEPARIN LEVEL (UNFRACTIONATED)
Heparin Unfractionated: 0.56 IU/mL (ref 0.30–0.70)
Heparin Unfractionated: 0.47 IU/mL (ref 0.30–0.70)

## 2022-04-12 MED ORDER — POTASSIUM CHLORIDE 10 MEQ/50ML IV SOLN
10.0000 meq | INTRAVENOUS | Status: AC
  Administered 2022-04-12 (×4): 10 meq via INTRAVENOUS
  Filled 2022-04-12 (×4): qty 50

## 2022-04-12 MED ORDER — POTASSIUM CHLORIDE 20 MEQ PO PACK
20.0000 meq | PACK | ORAL | Status: DC
  Administered 2022-04-12: 20 meq
  Filled 2022-04-12: qty 1

## 2022-04-12 NOTE — Progress Notes (Signed)
Patient placed in wean mode 10/5 30%. Patient appears to be tolerating well at this time. RT will continue to monitor

## 2022-04-12 NOTE — Progress Notes (Signed)
2 Days Post-Op   Chief Complaint/Subjective: Communicating by charades with wife throughout the night  Objective: Vital signs in last 24 hours: Temp:  [98.8 F (37.1 C)-101.3 F (38.5 C)] 100 F (37.8 C) (11/30 0807) Pulse Rate:  [61-79] 65 (11/30 0807) Resp:  [10-18] 14 (11/30 0807) BP: (86-138)/(46-106) 118/64 (11/30 0730) SpO2:  [99 %-100 %] 100 % (11/30 1829) Arterial Line BP: (86-128)/(45-77) 128/63 (11/29 1215) FiO2 (%):  [30 %-40 %] 30 % (11/30 0838) Weight:  [112.1 kg] 112.1 kg (11/30 0423) Last BM Date : 04/11/22 Intake/Output from previous day: 11/29 0701 - 11/30 0700 In: 2113.2 [I.V.:1830.8; IV Piggyback:282.4] Out: 1600 [Urine:1300; Emesis/NG output:200; Stool:100] Intake/Output this shift: Total I/O In: 172.9 [I.V.:106.4; IV Piggyback:66.6] Out: 100 [Urine:100]  PE: Gen: intubated sedated Resp: assisted with ventilator Card: RRR Abd: soft, ostomy pink and viable, wound intact without drainage or erythema  Lab Results:  Recent Labs    04/11/22 0435 04/11/22 1026 04/11/22 1536 04/12/22 0240  WBC 11.0*  --   --  14.9*  HGB 11.1*   < > 10.9* 10.6*  HCT 30.7*   < > 31.1* 30.5*  PLT 125*  --   --  145*   < > = values in this interval not displayed.   BMET Recent Labs    04/11/22 0435 04/12/22 0240  NA 134* 131*  K 3.8 3.1*  CL 101 95*  CO2 24 28  GLUCOSE 204* 141*  BUN 18 15  CREATININE 1.57* 1.22  CALCIUM 7.6* 7.4*   PT/INR No results for input(s): "LABPROT", "INR" in the last 72 hours. CMP     Component Value Date/Time   NA 131 (L) 04/12/2022 0240   K 3.1 (L) 04/12/2022 0240   CL 95 (L) 04/12/2022 0240   CO2 28 04/12/2022 0240   GLUCOSE 141 (H) 04/12/2022 0240   BUN 15 04/12/2022 0240   CREATININE 1.22 04/12/2022 0240   CALCIUM 7.4 (L) 04/12/2022 0240   PROT 6.3 (L) 07/27/2021 0556   ALBUMIN 2.9 (L) 07/27/2021 0556   AST 14 (L) 07/27/2021 0556   ALT 13 07/27/2021 0556   ALKPHOS 50 07/27/2021 0556   BILITOT 1.3 (H) 07/27/2021  0556   GFRNONAA >60 04/12/2022 0240   GFRAA >60 08/19/2019 1417   Lipase     Component Value Date/Time   LIPASE 42 04/08/2022 0818    Studies/Results: DG CHEST PORT 1 VIEW  Result Date: 04/12/2022 CLINICAL DATA:  Hypoxia EXAM: PORTABLE CHEST 1 VIEW COMPARISON:  04/10/22 CXR FINDINGS: Endotracheal tube in place. Poor visualization of the carina makes it difficult to assess the location of the endotracheal tube. However, the tip subjectively appears lower compared to prior radiograph. Enteric tube courses below diaphragm with the tip out of the field of view and side hole poorly visualized. Left-sided central venous catheter with the tip terminating in the upper SVC. No pleural effusion. No pneumothorax. No new focal airspace opacity. There is persistent poor visualization of the diaphragm bilaterally, possibly secondary to a combination of small pleural effusions and atelectasis. Unchanged cardiac contours. Compared to prior exam there is a lucency along the paramediastinal aspect of the right upper lung, which is nonspecific, and may represent the cuff of the endotracheal tube. IMPRESSION: 1. Poor visualization of the carina makes it difficult to assess the location of the endotracheal tube. However, the tip subjectively appears slightly lower compared to prior radiograph. 2. Persistent poor visualization of the diaphragm bilaterally, possibly secondary to a combination of small pleural  effusions and atelectasis. Electronically Signed   By: Marin Roberts M.D.   On: 04/12/2022 08:20   CT ABDOMEN PELVIS WO CONTRAST  Result Date: 04/10/2022 CLINICAL DATA:  Abdominal distension and abdominal pain. EXAM: CT ABDOMEN AND PELVIS WITHOUT CONTRAST TECHNIQUE: Multidetector CT imaging of the abdomen and pelvis was performed following the standard protocol without IV contrast. RADIATION DOSE REDUCTION: This exam was performed according to the departmental dose-optimization program which includes automated  exposure control, adjustment of the mA and/or kV according to patient size and/or use of iterative reconstruction technique. COMPARISON:  April 08, 2022. FINDINGS: Lower chest: Basilar volume loss and more confluent airspace disease noted on the RIGHT as compared to the LEFT. Small RIGHT-sided pleural effusion. Signs of coronary artery disease. Central venous access device terminates at the caval to atrial junction. No chest wall abnormality. Hepatobiliary: Smooth hepatic contours. There is intermediate density fluid at 34 Hounsfield units along the RIGHT hemiliver, some areas slightly higher. This hemoperitoneum is most pronounced in the RIGHT upper quadrant. Abundant stranding adjacent to the RIGHT hemicolon. No gross pericholecystic stranding. Sludge or blood in the gallbladder. Pancreas: Pancreas with normal contours, no signs of inflammation or peripancreatic fluid. Spleen: Spleen normal size and contour. Adrenals/Urinary Tract: Adrenal glands are normal. Renal cortical scarring bilaterally and signs of renal calculi without change. No signs of hydronephrosis. Urinary bladder is collapsed and difficult to evaluate given the presence of streak artifact from bilateral hip arthroplasty changes. Stomach/Bowel: Distension of the stomach. Gastric tube appears outside of the wall of the stomach on axials and coronal is a. sagittal imaging is equivocal. There is fluid adjacent to the stomach in the LEFT upper quadrant in the subdiaphragmatic space. The most abnormal finding on the current study is however a large hematoma with hematocrit in the RIGHT upper quadrant which displaces the colon anteriorly (image 62/4 and image 60/5. This measures 10.0 x 9.7 x 12.5 cm. Blood tracks over the RIGHT hemiliver as discussed. Blood appears confined to the anterior pararenal space and then tracks into the peritoneum along the margin of the RIGHT hemiliver. Colon remains distended with partially formed stool and is displaced  anteriorly and medially by the large RIGHT abdominal hematoma which is likely centered in the anterior pararenal space and proximal transverse mesocolon adjacent to the hepatic flexure. There is also interval development of moderate fluid elsewhere that measures lower density but is a stents of Leigh related. Moderate fluid tracks into the pelvis. No sign of small bowel dilation. Vascular/Lymphatic: Normal caliber of the abdominal aorta. Calcified aortic atherosclerotic changes. No retroperitoneal hematoma adjacent to vascular structures. No gross intramuscular hematoma in the retroperitoneum in this patient on anticoagulation. Reproductive: Not well evaluated but grossly unremarkable by CT. Other: Hemoperitoneum and anterior pararenal/mesocolon hematoma. Musculoskeletal: No acute musculoskeletal findings. Spinal fusion extending from L2 through L5 as on previous imaging. Bilateral hip arthroplasty changes. Stranding in the body wall likely reflects developing anasarca. IMPRESSION: 1. Large hematoma with hematocrit in the RIGHT upper quadrant which displaces the colon anteriorly and medially. This is likely centered in the anterior pararenal space and then tracks into the peritoneum along the margin of the RIGHT hemiliver with moderately large amount of blood over the RIGHT hemiliver and along the RIGHT hepatic margin. Would be difficult to exclude bleeding from the liver as there is some wave like added density along the inferior margin of the RIGHT hemiliver though the process appears to be centered more within the anterior pararenal space and mesocolon displacing  the colon anteriorly. Given displacement and large size of the pericolonic hematoma vascular compromise as a result of mass effect is a concern. 2. Gastric tube appears outside of the wall of the stomach on axials and coronal, also with an abnormal appearance on sagittal imaging though assessment limited by adjacent fluid in the LEFT subdiaphragmatic  space. Given potential extraluminal position the possibility of developing generalized ischemia is considered. Correlate with lactate. Suggest administration of water soluble positive oral contrast through the patient's gastric tube with CT imaging of the abdomen if surgical exploration is not undertaken to exclude concomitant gastric compromise. 3. Basilar volume loss and more confluent airspace disease noted on the RIGHT as compared to the LEFT. Findings may represent atelectasis or developing pneumonia due to aspiration, attention on follow-up 4. Small RIGHT-sided pleural effusion. 5. Signs of coronary artery disease. 6. Renal cortical scarring bilaterally and signs of renal calculi without change. 7. Aortic atherosclerosis. Aortic Atherosclerosis (ICD10-I70.0). Critical Value/emergent results were called by telephone at the time of interpretation on 04/10/2022 at 6:35 pm to provider [facility] , who verbally acknowledged these results. Electronically Signed   By: Zetta Bills M.D.   On: 04/10/2022 18:36   ECHOCARDIOGRAM LIMITED  Result Date: 04/10/2022    ECHOCARDIOGRAM LIMITED REPORT   Patient Name:   Kyle Delacruz [facility] Date of Exam: 04/10/2022 Medical Rec #:  [id number]       Height:       69.0 in Accession #:    [id number]      Weight:       250.2 lb Date of Birth:  1955/05/23       BSA:          2.272 m Patient Age:    66 years        BP:           113/69 mmHg Patient Gender: M               HR:           138 bpm. Exam Location:  Inpatient Procedure: Limited Echo STAT ECHO Indications:    Pulmonary embolus  History:        Patient has prior history of Echocardiogram examinations, most                 recent 04/09/2022. Abnormal ECG, COPD, Signs/Symptoms:Dyspnea                 and Shortness of Breath; Risk Factors:Hypertension,                 Dyslipidemia, Current Smoker and Sleep Apnea.  Sonographer:    Eartha Inch Referring Phys: [id number] Candee Furbish  Sonographer Comments: Technically  challenging study due to limited acoustic windows. Image acquisition challenging due to patient body habitus.  Comparison(s): Very brief, limited 2D only study for right ventricular assessment in a patient with pulmonary embolism. Apical image is very poor. Based on parasternal images, the right ventricle does not appear to be dilated or severely depressed. Sanda Klein MD Electronically signed by Sanda Klein MD Signature Date/Time: 04/10/2022/4:52:57 PM    Final    Portable Chest x-ray  Result Date: 04/10/2022 CLINICAL DATA:  Intubation EXAM: PORTABLE CHEST 1 VIEW COMPARISON:  08/19/2019 FINDINGS: Endotracheal tube in good position. Left jugular central venous catheter tip in the SVC. NG coiled in the stomach. Hypoventilation with bibasilar atelectasis. Chronic elevation right hemidiaphragm unchanged. No significant pleural fluid. No pneumothorax. IMPRESSION: 1. Endotracheal tube in  good position. 2. Hypoventilation with bibasilar atelectasis. 3. Central venous catheter tip in the SVC.  No pneumothorax. Electronically Signed   By: Franchot Gallo M.D.   On: 04/10/2022 16:51   DG Abd Portable 1V  Result Date: 04/10/2022 CLINICAL DATA:  [id number] Abdominal pain [id number] EXAM: PORTABLE ABDOMEN - 1 VIEW COMPARISON:  CT abdomen pelvis 04/08/2022 FINDINGS: Persistent marked gaseous dilatation of the colon (up to 12 cm on radiograph). Limited evaluation for free gas on this supine radiograph. No definite radiographic configuration to suggest volvulus. No radio-opaque calculi or other significant radiographic abnormality are seen. Lumbar surgical hardware and bilateral hip total arthroplasty. IMPRESSION: Grossly stable with persistent marked gaseous dilatation of the colon. Finding better evaluated on CT abdomen 04/08/2022. Electronically Signed   By: Iven Finn M.D.   On: 04/10/2022 12:45    Anti-infectives: Anti-infectives (From admission, onward)    Start     Dose/Rate Route Frequency Ordered Stop    04/10/22 2107  metroNIDAZOLE (FLAGYL) 500 MG/100ML IVPB       Note to Pharmacy: Enis Gash W: cabinet override      04/10/22 2107 04/10/22 2114   04/10/22 2103  ceFAZolin (ANCEF) 2-4 GM/100ML-% IVPB       Note to Pharmacy: Enis Gash W: cabinet override      04/10/22 2103 04/11/22 0914       Assessment/Plan  s/p Procedure(s): EXPLORATORY LAPAROTOMY, TOTAL COLECTOMY, END ILEOSTOMY 04/10/2022  -minimal ostomy drainage  FEN - continue NG tube VTE - heparin drip ID - no issues Disposition - ICU for pressor support, weaning toward extubation   LOS: 4 days   I reviewed last 24 h vitals and pain scores, last 48 h intake and output, last 24 h labs and trends, and last 24 h imaging results.  This care required high  level of medical decision making.   [facility] 04/12/2022, 9:04 AM Please see Amion for pager number during day hours 7:00am-4:30pm or 7:00am -11:30am on weekends

## 2022-04-12 NOTE — Progress Notes (Signed)
R pleural space assessment completed > appears like consolidated lung on [place]. No effusion.     Kyle Gens, MSN, APRN, NP-C, AGACNP-BC [facility] 04/12/2022, 1:40 PM   Please see [email or web address] for pager details.   From 7A-7P if no response, please call [phone] After hours, please call ELink [phone]

## 2022-04-12 NOTE — Procedures (Signed)
Extubation Procedure Note  Patient Details:   Name: Kyle Delacruz DOB: Nov 19, 1955 MRN: [id number]   Airway Documentation:  Airway 8 mm (Active)  Secured at (cm) 25 cm 04/12/22 0838  Measured From Lips 04/12/22 [place] 04/12/22 0838  Secured By [employer] Tube Holder 04/12/22 0838  Tube Holder Repositioned Yes 04/12/22 0838  Prone position Yes 04/12/22 0838  Head position Left 04/12/22 0838  Cuff Pressure (cm H2O) MOV (Manual Technique) 04/12/22 0838  Site Condition Cool;Dry 04/12/22 0838   Vent end date: (not recorded) Vent end time: (not recorded)   Evaluation  O2 sats: stable throughout Complications: No apparent complications Patient did tolerate procedure well. Bilateral Breath Sounds: Clear, Diminished   Yes Patient placed on a 3L [place] and appears to be tolerating well at this time.  Joellyn Rued 04/12/2022, 10:29 AM

## 2022-04-12 NOTE — Progress Notes (Signed)
[facility] ADULT ICU REPLACEMENT PROTOCOL   The patient does apply for the [facility] Adult ICU Electrolyte Replacment Protocol based on the criteria listed below:   1.Exclusion criteria: TCTS, ECMO, Dialysis, and Myasthenia Gravis patients 2. Is GFR >/= 30 ml/min? Yes.    Patient's GFR today is >60 3. Is SCr </= 2? Yes.   Patient's SCr is 1.22 mg/dL 4. Did SCr increase >/= 0.5 in 24 hours? No. 5.Pt's weight >40kg  Yes.   6. Abnormal electrolyte(s):   K 3.1  7. Electrolytes replaced per protocol 8.  Call MD STAT for K+ </= 2.5, Phos </= 1, or Mag </= 1 Physician:  S. Rosie Fate R Amity Roes 04/12/2022 4:43 AM

## 2022-04-12 NOTE — Progress Notes (Incomplete)
04/12/2022 Seen for resp failure, shock.  Improved, some output from ostomy  On sedation but will follow commands Triggers vent  Some anasarca on exam  Pressor needs better  Abd soft, incision lines look okay, ongoing fair amount of NG output  Repleting K this am, recheck 1400  If off pressors and K better at 1400 would give diuretic  Heparin is on for submassive PE  NG activity, ostomy, and incisional care per CCS, appreciate help  Will probably need rehab  My cc time 35 mins Erskine Emery MD PCCM

## 2022-04-12 NOTE — Consult Note (Signed)
[place] Nurse ostomy consult note Stoma type/location: RLQ, end ileostomy  Stomal assessment/size: 1 1/2" round, budded, pink Peristomal assessment: NA Treatment options for stomal/peristomal skin: NA Output none Ostomy pouching: 2pc. 2 1/4" in place from OR Education provided:  Patient recently extubated, drowsy but able to understand teaching, still in mitts. Wife and brother engaged to learn and at the bedside  Explained role of ostomy nurse and creation of stoma, specifically ileostomy and difference in output  Discussed bathing, diet, gas, medication (use of XR meds) diarrhea (consistency of ileostomy output) risk and prevention of dehydration  Discussed food blockage   Will plan pouch change tomorrow when patient possibly  more awake and per wife request.   Enrolled patient in [place] program: Yes  [place] Nurse will follow along with you for continued support with ostomy teaching and care [place] MSN, RN, [place], [place], [place]

## 2022-04-12 NOTE — Plan of Care (Signed)
Discussed with patient in front of wife plan of care for the evening, pain management and medications with some teach back displayed.  Wife plans to go home tonight and patient wants rest over task.  Patient will ambulate tomorrow.  Problem: Education: Goal: Knowledge of General Education information will improve Description: Including pain rating scale, medication(s)/side effects and non-pharmacologic comfort measures Outcome: Progressing

## 2022-04-12 NOTE — Progress Notes (Addendum)
ANTICOAGULATION CONSULT NOTE - Follow Up Consult  Pharmacy Consult for Heparin Indication: pulmonary embolus and DVT  Allergies  Allergen Reactions   Bee Venom     Other reaction(s): Unknown   Lisinopril     Other reaction(s): Unknown   Metoclopramide Other (See Comments)    Other reaction(s): shaking too bad "Sweat like crazy"    Penicillins Hives    Did it involve swelling of the face/tongue/throat, SOB, or low BP? Yes Did it involve sudden or severe rash/hives, skin peeling, or any reaction on the inside of your mouth or nose? Yes Did you need to seek medical attention at a hospital or doctor's office? No When did it last happen?  childhood      If all above answers are "NO", may proceed with cephalosporin use.     Shellfish Allergy Swelling    Patient Measurements: Height: '5\' 10"'$  (177.8 cm) Weight: 112.1 kg (247 lb 2.2 oz) IBW/kg (Calculated) : 73 Heparin Dosing Weight: 94 kg  Vital Signs: Temp: 100.2 F (37.9 C) (11/30 1000) Temp Source: Bladder (11/30 0900) BP: 125/65 (11/30 1000) Pulse Rate: 71 (11/30 1000)  Labs: Recent Labs    04/10/22 0240 04/10/22 1035 04/10/22 1428 04/10/22 1430 04/10/22 1619 04/10/22 2101 04/11/22 0435 04/11/22 1026 04/11/22 1536 04/11/22 2130 04/12/22 0240 04/12/22 0943  HGB 8.5*   < >  --   --  6.4*   < > 11.1*   < > 10.9*  --  10.6* 10.2*  HCT 26.6*   < >  --   --  19.9*   < > 30.7*   < > 31.1*  --  30.5* 29.9*  PLT 187  --   --   --   --   --  125*  --   --   --  145*  --   HEPARINUNFRC 0.73*   < >  --   --   --   --   --   --   --  0.27* 0.56 0.47  CREATININE  --   --   --  1.49*  --   --  1.57*  --   --   --  1.22  --   TROPONINIHS  --   --  8  --  8  --   --   --   --   --   --   --    < > = values in this interval not displayed.     Estimated Creatinine Clearance: 76.7 mL/min (by C-G formula based on SCr of 1.22 mg/dL).  Assessment: 47 yoM presented on 11/26 for cough, SOB, abdominal pain.  PMH significant for  DVT/PE after previous cervical spine surgery 07/17/21 and he completed a course of apixaban in August.  He had a recent L2-L5 fusion on 04/04/22 and had a postop plan to be on Lovenox '40mg'$  (filled 04/04/22 x7 days).  Today, CTa is positive for acute PE with right heart strain and postop ileus.  Dopplers are positive for DVT in RLE.   Significant Events: Heparin stopped 11/28 for hemoperitoneum, colonic ileus 11/28 to OR: exp lap. TAC, end ileostomy . Intra-op source of bleeding found to be mesentery of hepatic flexure S/p massive transfusion protocol 11/28 & 1 dose of TXA> latest Hg 10.8 & PLT 125 11/29 heparin resumed at1400 with no bolus per CCS & CCM   04/12/2022 Heparin level 0.47 is therapeutic on 1600 units/hr Hg is stable at 10.2, PLT 145. Just extubated. No  bleeding reported  Goal of Therapy:  Heparin level 0.3-0.7 units/ml Monitor platelets by anticoagulation protocol: Yes   Plan:  continue heparin drip @ 1600 units/hr Daily heparin level and CBC  Eudelia Bunch, Pharm.D Use secure chat for questions 04/12/2022 10:48 AM

## 2022-04-12 NOTE — Progress Notes (Addendum)
NAME:  Kyle Delacruz, MRN:  [id number], DOB:  08-Nov-1955, LOS: 4 ADMISSION DATE:  04/08/2022, CONSULTATION DATE:  04/10/22 REFERRING MD:  Lonny Prude, CHIEF COMPLAINT:  abd pain   History of Present Illness:  66 year old man with hx of provoked DVT who presents after lumbar surgery with worsening SOB and RLE swelling.  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  Echo/CTA showing some RV strain but nothing alarming.  Hospital course complicated by severe constipation vs. Colonic ileus partially relieved with enema 04/10/22.  He developed searing epigastric pain with radiation to shoulder, and worsened with movement after enema.    He subsequently developed worsening hypoxia, tachycardia and hypotension requiring intubation on 11/28.  CT results showed a large pericolonic hematoma tracking into the peritoneum and right hemiliver.  Mass transfusion protocol was activated and he was evaluated by General Surgery who took him for an emergency ex lap with total colectomy and end ileostomy.  Pertinent  Medical History  Recurrent VTE DM2 HLD Hypothyroidism OSA  Significant Hospital Events: Including procedures, antibiotic start and stop dates in addition to other pertinent events   11/26 Admit 11/28 PCCM consult, intubated, emergency ex-lap, requiring pressors 11/29 Heparin restarted  Interim History / Subjective:  No evidence of bleeding on heparin Remains on pressors, sedation  Tmax 101.3/ WBC 14.9 BG range 126-194 I/O UOP 1.3L, +519m in last 24h  Objective   Blood pressure 117/63, pulse 63, temperature 99.9 F (37.7 C), resp. rate 13, height '5\' 10"'$  (1.778 m), weight 112.1 kg, SpO2 100 %. CVP:  [7 mmHg] 7 mmHg  Vent Mode: PRVC FiO2 (%):  [30 %-40 %] 30 % Set Rate:  [10 bmp-22 bmp] 10 bmp Vt Set:  [580 mL] 580 mL PEEP:  [5 cmH20] 5 cmH20 Plateau Pressure:  [18 cmH20-23 cmH20] 19 cmH20   Intake/Output Summary (Last 24 hours) at 04/12/2022 0700 Last data filed at 04/12/2022 0600 Gross per 24 hour   Intake 1998.61 ml  Output 1600 ml  Net 398.61 ml   Filed Weights   04/10/22 0400 04/11/22 0500 04/12/22 0423  Weight: 113.5 kg 111.6 kg 112.1 kg    Examination: General: critically ill appearing adult male lying in bed on vent in NAD  HEENT: MM pink/moist, ETT, anicteric, NGT  Neuro: sedate, arouses to voice, nods appropriately to questions, follows commands CV: s1s2 RRR, no m/r/g PULM: non-labored, lungs bilaterally clear with good air entry  GI: soft, non-distended, midline waffle dressing, ostomy pink with green / brown drainage  Extremities: warm/dry, trace edema, BLE ted hose  Skin: no rashes or lesions  Resolved Hospital Problem list     Assessment & Plan:   Acute Hypoxic Respiratory Failure Recurrent Provoked VTE on AC Trop flat on admit; echo with some RV dilation but mostly preserved -PRVC with LTVV, rate 10 with alkalosis on 11/29 -wean PEEP / FiO2 for sats >90% -SBT / WUA today  -follow intermittent CXR, at risk TACO/TRALI -assess pleural space with UKoreagiven CXR  -continue heparin for PE   Hemorrhagic Shock Intra-abdominal Hematoma s/p Ex-Lap Post-op colonic ileus Acute Blood Loss Anemia Mass Transfusion protocol and emergent ex-lap with total colectomy and end ileostomy on 11/28 -appreciate CCS -wean pressors for MAP >65  -Ostomy care / NGT per CCS  -defer timing of feeding to CCS  -WOC for ostomy -strict I/O's   Fever Received IV Tylenol x1 on 11/29 -follow fever curve / WBC trend   AKI  Baseline creatinine 0.9-1.1 -Trend BMP / urinary output -Replace electrolytes  as indicated -Avoid nephrotoxic agents, ensure adequate renal perfusion  Hypokalemia Hypomagnesemia -follow electrolytes, replace as indicated -KCL 11/30  DM II  -await Hgb A1c  -SSI, sensitive scale  -may need TF coverage once enteral feeding initiated   Hypothyroidism -hold home synthroid for now  HLD -hold home statin   Post-op L2-5 Decompression and fusion -PT / OT  when able  -supportive care  -pain control   At Risk Malnutrition -defer timing of feeding to CCS -oral care   Best Practice (right click and "Reselect all SmartList Selections" daily)  Diet/type: NPO DVT prophylaxis: systemic heparin, TED hose GI prophylaxis: PPI Lines: Central line and yes and it is still needed Foley:  Yes, and it is still needed Code Status:  full code Last date of multidisciplinary goals of care discussion: full code.  Wife updated at bedside 11/30 on plan of care.  Critical care time: 30 minutes    Noe Gens, MSN, APRN, NP-C, AGACNP-BC [facility] 04/12/2022, 8:37 AM   Please see [email or web address] for pager details.   From 7A-7P if no response, please call [phone] After hours, please call ELink [phone]

## 2022-04-13 DIAGNOSIS — I2699 Other pulmonary embolism without acute cor pulmonale: Secondary | ICD-10-CM | POA: Diagnosis not present

## 2022-04-13 LAB — CBC
HCT: 26.7 % — ABNORMAL LOW (ref 39.0–52.0)
Hemoglobin: 8.9 g/dL — ABNORMAL LOW (ref 13.0–17.0)
MCH: 29.6 pg (ref 26.0–34.0)
MCHC: 33.3 g/dL (ref 30.0–36.0)
MCV: 88.7 fL (ref 80.0–100.0)
Platelets: 144 10*3/uL — ABNORMAL LOW (ref 150–400)
RBC: 3.01 MIL/uL — ABNORMAL LOW (ref 4.22–5.81)
RDW: 15.7 % — ABNORMAL HIGH (ref 11.5–15.5)
WBC: 11.6 10*3/uL — ABNORMAL HIGH (ref 4.0–10.5)
nRBC: 0 % (ref 0.0–0.2)

## 2022-04-13 LAB — GLUCOSE, CAPILLARY
Glucose-Capillary: 119 mg/dL — ABNORMAL HIGH (ref 70–99)
Glucose-Capillary: 108 mg/dL — ABNORMAL HIGH (ref 70–99)
Glucose-Capillary: 104 mg/dL — ABNORMAL HIGH (ref 70–99)
Glucose-Capillary: 98 mg/dL (ref 70–99)
Glucose-Capillary: 102 mg/dL — ABNORMAL HIGH (ref 70–99)
Glucose-Capillary: 100 mg/dL — ABNORMAL HIGH (ref 70–99)

## 2022-04-13 LAB — BASIC METABOLIC PANEL
Anion gap: 6 (ref 5–15)
BUN: 12 mg/dL (ref 8–23)
CO2: 29 mmol/L (ref 22–32)
Calcium: 7.5 mg/dL — ABNORMAL LOW (ref 8.9–10.3)
Chloride: 93 mmol/L — ABNORMAL LOW (ref 98–111)
Creatinine, Ser: 0.9 mg/dL (ref 0.61–1.24)
GFR, Estimated: >60 mL/min (ref >60)
Glucose, Bld: 115 mg/dL — ABNORMAL HIGH (ref 70–99)
Potassium: 3.6 mmol/L (ref 3.5–5.1)
Sodium: 128 mmol/L — ABNORMAL LOW (ref 135–145)

## 2022-04-13 LAB — LACTIC ACID, PLASMA: Lactic Acid, Venous: 1 mmol/L (ref 0.5–1.9)

## 2022-04-13 LAB — HEPARIN LEVEL (UNFRACTIONATED)
Heparin Unfractionated: 0.26 IU/mL — ABNORMAL LOW (ref 0.30–0.70)
Heparin Unfractionated: 0.22 IU/mL — ABNORMAL LOW (ref 0.30–0.70)

## 2022-04-13 LAB — SODIUM, URINE, RANDOM: Sodium, Ur: 67 mmol/L

## 2022-04-13 LAB — HEMOGLOBIN AND HEMATOCRIT, BLOOD
HCT: 28.6 % — ABNORMAL LOW (ref 39.0–52.0)
Hemoglobin: 9.5 g/dL — ABNORMAL LOW (ref 13.0–17.0)

## 2022-04-13 LAB — MAGNESIUM: Magnesium: 1.9 mg/dL (ref 1.7–2.4)

## 2022-04-13 MED ORDER — SODIUM CHLORIDE 0.9 % IV SOLN: INTRAVENOUS | Status: DC

## 2022-04-13 MED ORDER — ACETAMINOPHEN 325 MG PO TABS
650.0000 mg | ORAL_TABLET | Freq: Four times a day (QID) | ORAL | Status: DC
  Administered 2022-04-13 – 2022-04-22 (×35): 650 mg via ORAL
  Filled 2022-04-13 (×35): qty 2

## 2022-04-13 MED ORDER — HEPARIN (PORCINE) 25000 UT/250ML-% IV SOLN
2150.0000 [IU]/h | INTRAVENOUS | Status: DC
  Administered 2022-04-13: 1850 [IU]/h via INTRAVENOUS
  Administered 2022-04-14 – 2022-04-16 (×6): 2050 [IU]/h via INTRAVENOUS
  Administered 2022-04-17 – 2022-04-18 (×4): 2150 [IU]/h via INTRAVENOUS
  Filled 2022-04-13 (×10): qty 250

## 2022-04-13 MED ORDER — HEPARIN (PORCINE) 25000 UT/250ML-% IV SOLN
1650.0000 [IU]/h | INTRAVENOUS | Status: DC
  Administered 2022-04-13: 1650 [IU]/h via INTRAVENOUS
  Filled 2022-04-13: qty 250

## 2022-04-13 MED ORDER — ALBUMIN HUMAN 5 % IV SOLN
12.5000 g | Freq: Once | INTRAVENOUS | Status: AC
  Administered 2022-04-13: 12.5 g via INTRAVENOUS
  Filled 2022-04-13: qty 250

## 2022-04-13 MED ORDER — PHENOL 1.4 % MT LIQD
1.0000 | OROMUCOSAL | Status: DC | PRN
  Administered 2022-04-13 – 2022-04-15 (×3): 1 via OROMUCOSAL
  Filled 2022-04-13: qty 177

## 2022-04-13 MED ORDER — ORAL CARE MOUTH RINSE
15.0000 mL | OROMUCOSAL | Status: DC
  Administered 2022-04-13 (×4): 15 mL via OROMUCOSAL

## 2022-04-13 MED ORDER — POTASSIUM CHLORIDE 10 MEQ/100ML IV SOLN: 10.0000 meq | INTRAVENOUS | Status: DC

## 2022-04-13 MED ORDER — POTASSIUM CHLORIDE 10 MEQ/50ML IV SOLN
10.0000 meq | INTRAVENOUS | Status: AC
  Administered 2022-04-13 (×4): 10 meq via INTRAVENOUS
  Filled 2022-04-13 (×4): qty 50

## 2022-04-13 MED ORDER — ORAL CARE MOUTH RINSE: 15.0000 mL | OROMUCOSAL | Status: DC | PRN

## 2022-04-13 MED ORDER — OXYCODONE HCL 5 MG PO TABS
5.0000 mg | ORAL_TABLET | ORAL | Status: DC | PRN
  Administered 2022-04-13 – 2022-04-15 (×7): 5 mg via ORAL
  Filled 2022-04-13 (×8): qty 1

## 2022-04-13 NOTE — Consult Note (Signed)
[facility] Nurse ostomy follow up Stoma type/location: RUQ, end ileostomy  Stomal assessment/size: 1 1/2" round, budded, red, moist  Peristomal assessment: intact  Treatment options for stomal/peristomal skin: 2" skin barrier ring Output bloody, no stool  Ostomy pouching: 2 pc. 2 1/4" with 2" skin barrier ring  Education provided:  Explained role of ostomy nurse and creation of stoma  Explained stoma characteristics (budded, flush, color, texture, care) Demonstrated pouch change (cutting new skin barrier, measuring stoma, cleaning peristomal skin and stoma, use of barrier ring) Education on emptying when 1/3 to 1/2 full and how to empty Demonstrated use of wick to clean spout  Discussed bathing, diet, gas, medication use, constipation, diarrhea, dehydration  Discussed food blockage   Will ask [place] nurse next week to take ileostomy diet handout    [place] Nurse will follow along with you for continued support with ostomy teaching and care [place] MSN, RN, [place], CNS, CWON-AP [phone]  Enrolled patient in [facility] Start Discharge program: Yes

## 2022-04-13 NOTE — Progress Notes (Signed)
3 Days Post-Op   Chief Complaint/Subjective: Extubated yesterday. Reports some back in his back and groin. Foley place. NG in place.  Objective: Vital signs in last 24 hours: Temp:  [97.7 F (36.5 C)-100.9 F (38.3 C)] 98.4 F (36.9 C) (12/01 1000) Pulse Rate:  [43-95] 77 (12/01 1000) Resp:  [9-24] 10 (12/01 1000) BP: (68-159)/(26-101) 86/49 (12/01 1000) SpO2:  [89 %-100 %] 99 % (12/01 1000) Weight:  [111.2 kg] 111.2 kg (12/01 0416) Last BM Date : 04/11/22 Intake/Output from previous day: 11/30 0701 - 12/01 0700 In: 1449.6 [P.O.:30; I.V.:1183.1; IV Piggyback:226.5] Out: 1975 [Urine:1600; Emesis/NG output:350; Stool:25] Intake/Output this shift: Total I/O In: 83 [I.V.:83] Out: -   PE: Gen: alert, cooperative  Resp: non-labored respirations Card: RRR Abd: soft, ostomy edematous and viable with small smt SS drainage in ostomy bag, wound intact without drainage or erythema. NG in place with ~ 400 cc in cannister, bilious GU: foley in place draining dark yellow/orange- tinged urine, UOP 1600cc  Lab Results:  Recent Labs    04/11/22 0435 04/11/22 1026 04/12/22 0240 04/12/22 0943 04/12/22 1515  WBC 11.0*  --  14.9*  --   --   HGB 11.1*   < > 10.6* 10.2* 10.0*  HCT 30.7*   < > 30.5* 29.9* 29.4*  PLT 125*  --  145*  --   --    < > = values in this interval not displayed.   BMET Recent Labs    04/12/22 0240 04/12/22 1515  NA 131* 130*  K 3.1* 3.7  CL 95* 95*  CO2 28 30  GLUCOSE 141* 121*  BUN 15 12  CREATININE 1.22 1.10  CALCIUM 7.4* 7.4*   PT/INR No results for input(s): "LABPROT", "INR" in the last 72 hours. CMP     Component Value Date/Time   NA 130 (L) 04/12/2022 1515   K 3.7 04/12/2022 1515   CL 95 (L) 04/12/2022 1515   CO2 30 04/12/2022 1515   GLUCOSE 121 (H) 04/12/2022 1515   BUN 12 04/12/2022 1515   CREATININE 1.10 04/12/2022 1515   CALCIUM 7.4 (L) 04/12/2022 1515   PROT 6.3 (L) 07/27/2021 0556   ALBUMIN 2.9 (L) 07/27/2021 0556   AST 14 (L)  07/27/2021 0556   ALT 13 07/27/2021 0556   ALKPHOS 50 07/27/2021 0556   BILITOT 1.3 (H) 07/27/2021 0556   GFRNONAA >60 04/12/2022 1515   GFRAA >60 08/19/2019 1417   Lipase     Component Value Date/Time   LIPASE 42 04/08/2022 0818    Studies/Results: DG CHEST PORT 1 VIEW  Result Date: 04/12/2022 CLINICAL DATA:  Hypoxia EXAM: PORTABLE CHEST 1 VIEW COMPARISON:  04/10/22 CXR FINDINGS: Endotracheal tube in place. Poor visualization of the carina makes it difficult to assess the location of the endotracheal tube. However, the tip subjectively appears lower compared to prior radiograph. Enteric tube courses below diaphragm with the tip out of the field of view and side hole poorly visualized. Left-sided central venous catheter with the tip terminating in the upper SVC. No pleural effusion. No pneumothorax. No new focal airspace opacity. There is persistent poor visualization of the diaphragm bilaterally, possibly secondary to a combination of small pleural effusions and atelectasis. Unchanged cardiac contours. Compared to prior exam there is a lucency along the paramediastinal aspect of the right upper lung, which is nonspecific, and may represent the cuff of the endotracheal tube. IMPRESSION: 1. Poor visualization of the carina makes it difficult to assess the location of the endotracheal tube.  However, the tip subjectively appears slightly lower compared to prior radiograph. 2. Persistent poor visualization of the diaphragm bilaterally, possibly secondary to a combination of small pleural effusions and atelectasis. Electronically Signed   By: Marin Roberts M.D.   On: 04/12/2022 08:20    Anti-infectives: Anti-infectives (From admission, onward)    Start     Dose/Rate Route Frequency Ordered Stop   04/10/22 2107  metroNIDAZOLE (FLAGYL) 500 MG/100ML IVPB       Note to Pharmacy: Enis Gash W: cabinet override      04/10/22 2107 04/10/22 2114   04/10/22 2103  ceFAZolin (ANCEF) 2-4 GM/100ML-%  IVPB       Note to Pharmacy: Enis Gash W: cabinet override      04/10/22 2103 04/11/22 0914       Assessment/Plan  s/p Procedure(s): EXPLORATORY LAPAROTOMY, TOTAL COLECTOMY, END ILEOSTOMY 04/10/2022  -minimal ostomy drainage  FEN - continue NG tube, ok to clamp for meds VTE - heparin drip ID - no issues Disposition - ICU for pressor support, remains extubated    LOS: 5 days   I reviewed last 24 h vitals and pain scores, last 48 h intake and output, last 24 h labs and trends, and last 24 h imaging results.  This care required high  level of medical decision making.   [facility] 04/13/2022, 10:35 AM Please see Amion for pager number during day hours 7:00am-4:30pm or 7:00am -11:30am on weekends

## 2022-04-13 NOTE — Progress Notes (Signed)
ANTICOAGULATION CONSULT NOTE - Follow Up Consult  Pharmacy Consult for Heparin Indication: pulmonary embolus and DVT  Allergies  Allergen Reactions   Bee Venom     Other reaction(s): Unknown   Lisinopril     Other reaction(s): Unknown   Metoclopramide Other (See Comments)    Other reaction(s): shaking too bad "Sweat like crazy"    Penicillins Hives    Did it involve swelling of the face/tongue/throat, SOB, or low BP? Yes Did it involve sudden or severe rash/hives, skin peeling, or any reaction on the inside of your mouth or nose? Yes Did you need to seek medical attention at a hospital or doctor's office? No When did it last happen?  childhood      If all above answers are "NO", may proceed with cephalosporin use.     Shellfish Allergy Swelling    Patient Measurements: Height: '5\' 10"'$  (177.8 cm) Weight: 111.2 kg (245 lb 2.4 oz) IBW/kg (Calculated) : 73 Heparin Dosing Weight: 94 kg  Vital Signs: Temp: 98.4 F (36.9 C) (12/01 1000) Temp Source: Bladder (12/01 0700) BP: 86/49 (12/01 1000) Pulse Rate: 77 (12/01 1000)  Labs: Recent Labs    04/10/22 1428 04/10/22 1430 04/10/22 1619 04/10/22 2101 04/11/22 0435 04/11/22 1026 04/12/22 0240 04/12/22 0943 04/12/22 1515 04/13/22 0845  HGB  --   --  6.4*   < > 11.1*   < > 10.6* 10.2* 10.0*  --   HCT  --   --  19.9*   < > 30.7*   < > 30.5* 29.9* 29.4*  --   PLT  --   --   --   --  125*  --  145*  --   --   --   HEPARINUNFRC  --   --   --   --   --    < > 0.56 0.47  --  0.26*  CREATININE  --    < >  --   --  1.57*  --  1.22  --  1.10  --   TROPONINIHS 8  --  8  --   --   --   --   --   --   --    < > = values in this interval not displayed.     Estimated Creatinine Clearance: 84.7 mL/min (by C-G formula based on SCr of 1.1 mg/dL).  Assessment: 80 yoM presented on 11/26 for cough, SOB, abdominal pain.  PMH significant for DVT/PE after previous cervical spine surgery 07/17/21 and he completed a course of apixaban in  August.  He had a recent L2-L5 fusion on 04/04/22 and had a postop plan to be on Lovenox '40mg'$  (filled 04/04/22 x7 days).  Today, CTa is positive for acute PE with right heart strain and postop ileus.  Dopplers are positive for DVT in RLE.   Significant Events: Heparin stopped 11/28 for hemoperitoneum, colonic ileus 11/28 to OR: exp lap. TAC, end ileostomy . Intra-op source of bleeding found to be mesentery of hepatic flexure S/p massive transfusion protocol 11/28 & 1 dose of TXA> latest Hg 10.8 & PLT 125 11/29 heparin resumed at1400 with no bolus per CCS & CCM  11/30 Extubated  04/13/2022 08:45 Heparin level 0.26 is sub-therapeutic on 1600 units/hr No interruption per nurse other than a couple minutes for lab draws No bleeding other than little amount in ostomy (Surgery aware) Hg is stable at 8.9, PLT stable at 144   Goal of Therapy:  Heparin level 0.3-0.7  units/ml Monitor platelets by anticoagulation protocol: Yes   Plan:  Increase heparin infusion to 1650 units/hr Then obtain 6 hour heparin level Monitor daily heparin level, CBC, signs/symptoms of bleeding   Thank you for allowing pharmacy to be a part of this patient's care.  Royetta Asal, PharmD, BCPS Clinical Pharmacist [place] Please utilize Amion for appropriate phone number to reach the unit pharmacist ([place]) 04/13/2022 10:51 AM

## 2022-04-13 NOTE — Evaluation (Signed)
Physical Therapy Evaluation Patient Details Name: Kyle Delacruz MRN: [id number] DOB: [birth year] Today's Date: 04/13/2022  History of Present Illness  Pt is a 66yo male presenting to [facility] ED on 11/26 with worsening SOB and RLE swelling with revealed PE, also constipation. Pt is s/p L2-L5 decompression & fusion 11/22. Underwent emergency exploratory laparatomy with total colectomy and end ileostomy on 11/28, also intubated 11/28. Extubated 11/30.  PMH: DM, hx of DVT 07/2021, GERD, gout, HLD, hypothyroidism, OSA, OSA non-compliant with CPAP, b/l TKA R-1997 L-2001, b/l THA R-2006 L-2009.   Clinical Impression  Pt presents with the problems listed above and functional impairments below. Reviewed back precautions verbally and pt was able to recall all components without cuing. Educated pt on splinting of abdomen during mobility/coughing/sneezing/laughing, verbalized understanding. Reviewed logrolling technique prior to mobility; pt required min assist for rolling and sidelying to sit, pt sat EOB for ~24mn to recover, encouraged pursed lip breathing. Pt required min assist +2 for transfers for line and AD management more than physical assist, pt with forward flexed posture and required cuing to bear weight through hands. Pt in recliner with family and RN present at exit. Pt was receiving HHPT secondary to s/p lumbar fusion, recommend resumption of same upon discharge. We will continue to follow acutely.   Vitals during session HR 100-135   SpO2 90-100% on RA with cuing for pursed lip breathing to maintain RR: 21-30 BP prior to mobility: 114/48 BP middle of session: 178/80 BP in recliner: 152/65      Recommendations for follow up therapy are one component of a multi-disciplinary discharge planning process, led by the attending physician.  Recommendations may be updated based on patient status, additional functional criteria and insurance authorization.  Follow Up Recommendations Home health PT       Assistance Recommended at Discharge Frequent or constant Supervision/Assistance  Patient can return home with the following  A little help with walking and/or transfers;A little help with bathing/dressing/bathroom;Assistance with cooking/housework;Assist for transportation;Help with stairs or ramp for entrance    Equipment Recommendations None recommended by PT (Pt has recommended DME)  Recommendations for Other Services       Functional Status Assessment Patient has had a recent decline in their functional status and demonstrates the ability to make significant improvements in function in a reasonable and predictable amount of time.     Precautions / Restrictions Precautions Precautions: Fall;Back Precaution Booklet Issued: No Precaution Comments: Reviewed verbally Restrictions Weight Bearing Restrictions: No Other Position/Activity Restrictions: wbat      Mobility  Bed Mobility Overal bed mobility: Needs Assistance Bed Mobility: Rolling, Sidelying to Sit Rolling: Min assist Sidelying to sit: Min assist       General bed mobility comments: Min assist via chuck pad for rolling, min assist to elevate trunk, verbal cues for use of bed rail and powering up through elbow.    Transfers Overall transfer level: Needs assistance Equipment used: Rolling walker (2 wheels) Transfers: Sit to/from Stand, Bed to chair/wheelchair/BSC Sit to Stand: Min assist, +2 safety/equipment   Step pivot transfers: Min assist, +2 safety/equipment       General transfer comment: Sit to stand: pt min assist for light lift assist with verbal cues to utilize BUE on RW and to stand up tall. Step pivot: min assist +2 for AD management, steadying, and line management.    Ambulation/Gait               General Gait Details: deferred  Stairs  Wheelchair Mobility    Modified Rankin (Stroke Patients Only)       Balance Overall balance assessment: Needs  assistance Sitting-balance support: Feet supported, No upper extremity supported Sitting balance-Leahy Scale: Good Sitting balance - Comments: Pt sat EOB without support for ~52mn to recover SpO2 and recover from bed mobility.   Standing balance support: Reliant on assistive device for balance, During functional activity, Bilateral upper extremity supported Standing balance-Leahy Scale: Poor Standing balance comment: Pt with trunk flexed forward but able to stand statically with BUE support (no PT assist) for ~60s.                             Pertinent Vitals/Pain Pain Assessment Pain Assessment: 0-10 Pain Score: 7  Pain Location: abdomen, low back Pain Descriptors / Indicators: Operative site guarding, Sore Pain Intervention(s): Limited activity within patient's tolerance, Monitored during session, Repositioned    Home Living Family/patient expects to be discharged to:: Private residence Living Arrangements: Spouse/significant other Available Help at Discharge: Family;Available 24 hours/day Type of Home: House Home Access: Stairs to enter Entrance Stairs-Rails: Right;Left;Can reach both Entrance Stairs-Number of Steps: 4 Alternate Level Stairs-Number of Steps: 16 Home Layout: Two level;Bed/bath upstairs;Able to live on main level with bedroom/bathroom;Full bath on main level Home Equipment: Rolling Walker (2 wheels);Cane - single point;Shower seat - built in;BSC/3in1 Additional Comments: Wife provided majority of home environment info    Prior Function Prior Level of Function : Independent/Modified Independent             Mobility Comments: Pt utilizing RW after lumbar surgery, assist with transfers and stairs ADLs Comments: Supervision for bathing     Hand Dominance        Extremity/Trunk Assessment   Upper Extremity Assessment Upper Extremity Assessment: Defer to OT evaluation    Lower Extremity Assessment Lower Extremity Assessment: Overall WFL for  tasks assessed    Cervical / Trunk Assessment Cervical / Trunk Assessment: Back Surgery;Other exceptions Cervical / Trunk Exceptions: abdominal surgery  Communication   Communication: No difficulties  Cognition Arousal/Alertness: Awake/alert Behavior During Therapy: WFL for tasks assessed/performed Overall Cognitive Status: Within Functional Limits for tasks assessed                                 General Comments: Pt quiet with eyes closed, answered with short phrases as his throat was causing him pain.        General Comments General comments (skin integrity, edema, etc.): Pt on 2LO2 at start of session. RA for the duration, remained at >/=90% though with cues to perform pursed lip breathing. Wife FSolmon Iceand friend present    Exercises General Exercises - Lower Extremity Ankle Circles/Pumps: AROM, Both, 5 reps Long Arc Quad: AROM, Both, 5 reps   Assessment/Plan    PT Assessment Patient needs continued PT services  PT Problem List Decreased strength;Decreased range of motion;Decreased activity tolerance;Decreased balance;Decreased mobility;Decreased coordination;Pain       PT Treatment Interventions DME instruction;Gait training;Stair training;Functional mobility training;Therapeutic activities;Therapeutic exercise;Balance training;Neuromuscular re-education;Patient/family education    PT Goals (Current goals can be found in the Care Plan section)  Acute Rehab PT Goals Patient Stated Goal: Go home PT Goal Formulation: With patient/family Time For Goal Achievement: 04/27/22 Potential to Achieve Goals: Good    Frequency Min 3X/week     Co-evaluation PT/OT/SLP Co-Evaluation/Treatment: Yes Reason for Co-Treatment: Complexity of the patient's  impairments (multi-system involvement);For patient/therapist safety;To address functional/ADL transfers PT goals addressed during session: Mobility/safety with mobility OT goals addressed during session: ADL's and  self-care       AM-PAC PT "6 Clicks" Mobility  Outcome Measure Help needed turning from your back to your side while in a flat bed without using bedrails?: A Little Help needed moving from lying on your back to sitting on the side of a flat bed without using bedrails?: A Little Help needed moving to and from a bed to a chair (including a wheelchair)?: A Little Help needed standing up from a chair using your arms (e.g., wheelchair or bedside chair)?: A Little Help needed to walk in hospital room?: A Little Help needed climbing 3-5 steps with a railing? : A Lot 6 Click Score: 17    End of Session Equipment Utilized During Treatment: Gait belt Activity Tolerance: Patient tolerated treatment well;No increased pain Patient left: in chair;with call bell/phone within reach;with family/visitor present Nurse Communication: Mobility status PT Visit Diagnosis: Pain;Difficulty in walking, not elsewhere classified (R26.2) Pain - part of body:  (abdomen, back)    Time: 4503-8882 PT Time Calculation (min) (ACUTE ONLY): 25 min   Charges:   PT Evaluation $PT Eval Moderate Complexity: [place], PT, DPT [facility] Office: [phone] Weekend pager: [phone]  [place]ze 04/13/2022, 1:02 PM

## 2022-04-13 NOTE — Progress Notes (Signed)
NAME:  Kyle Delacruz, MRN:  [id number], DOB:  08-02-1955, LOS: 5 ADMISSION DATE:  04/08/2022, CONSULTATION DATE:  04/10/22 REFERRING MD:  Lonny Prude, CHIEF COMPLAINT:  abd pain   History of Present Illness:  67 year old man with hx of provoked DVT who presents after lumbar surgery with worsening SOB and RLE swelling.  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  Echo/CTA showing some RV strain but nothing alarming.  Hospital course complicated by severe constipation vs. Colonic ileus partially relieved with enema 04/10/22.  He developed searing epigastric pain with radiation to shoulder, and worsened with movement after enema.    He subsequently developed worsening hypoxia, tachycardia and hypotension requiring intubation on 11/28.  CT results showed a large pericolonic hematoma tracking into the peritoneum and right hemiliver.  Mass transfusion protocol was activated and he was evaluated by General Surgery who took him for an emergency ex lap with total colectomy and end ileostomy.  Pertinent  Medical History  Recurrent VTE DM2 HLD Hypothyroidism OSA  Significant Hospital Events: Including procedures, antibiotic start and stop dates in addition to other pertinent events   11/26 Admit 11/28 PCCM consult, intubated, emergency ex-lap, requiring pressors 11/29 Heparin restarted 11/30 Extubated   Interim History / Subjective:  This AM on 2 mcg levophed and 0.03 vasopressin. On 2L [place]. With slight throat and ABD pain.   Objective   Blood pressure (!) 108/52, pulse (!) 59, temperature 99.3 F (37.4 C), resp. rate 12, height '5\' 10"'$  (1.778 m), weight 111.2 kg, SpO2 99 %.    Vent Mode: CPAP;PSV FiO2 (%):  [30 %] 30 % PEEP:  [5 [place] Pressure:  [20 cmH20] 20 cmH20   Intake/Output Summary (Last 24 hours) at 04/13/2022 0816 Last data filed at 04/13/2022 0600 Gross per 24 hour  Intake 1399.68 ml  Output 1775 ml  Net -375.32 ml   Filed Weights   04/11/22 0500 04/12/22 0423 04/13/22 0416   Weight: 111.6 kg 112.1 kg 111.2 kg    Examination: General:  adult male lying in bed, no distress noted  HEENT: dry MM, NG in place  Neuro: arouses with verbal stimulation, follows commands  CV: Brady, HR 58, no mRG  PULM: non-labored, lungs bilaterally clear with good air entry  GI: soft, slightly distended, mid line surgical incision, ostomy in place, hypoactive bowel sounds  Extremities: warm/dry, +2 edema throughout,  BLE ted hose  Skin: no rashes or lesions  Resolved Hospital Problem list     Assessment & Plan:   Acute Hypoxic Respiratory Failure Recurrent Provoked VTE on AC -Trop flat on admit; echo with some RV dilation but mostly preserved -Extubated 11/30  -Ultrasound 11/30 with consolidated right lung, no effusion  Plan -Titrate Supplemental oxygen for saturation goal >92 -Encourage good pulmonary hygiene  -follow intermittent CXR, at risk TACO/TRALI -continue heparin for PE   Hemorrhagic Shock Intra-abdominal Hematoma s/p Ex-Lap Post-op colonic ileus Acute Blood Loss Anemia Mass Transfusion protocol and emergent ex-lap with total colectomy and end ileostomy on 11/28 Plan -appreciate CCS -wean pressors for MAP >65. D/C vasopressin this AM  -Lactic acid pending  -Ostomy care / NGT per CCS  -defer timing of feeding to CCS  -WOC for ostomy -strict I/O's   Fever Received IV Tylenol x1 on 11/29 Plan -follow fever curve / WBC trend >> morning labs pending   AKI  Baseline creatinine 0.9-1.1 Plan -Trend BMP / urinary output -Replace electrolytes as indicated -Avoid nephrotoxic agents, ensure adequate renal perfusion  Hypokalemia Hypomagnesemia -  follow electrolytes, replace as indicated > AM labs penidng   DM II  -hemoglobin AIC 5.9 Plan -SSI, sensitive scale   Hypothyroidism -hold home synthroid for now  HLD -hold home statin   Post-op L2-5 Decompression and fusion -PT / OT when able  -supportive care  -pain control   At Risk  Malnutrition -defer timing of feeding to CCS -oral care   Best Practice (right click and "Reselect all SmartList Selections" daily)  Diet/type: NPO DVT prophylaxis: systemic heparin, TED hose GI prophylaxis: PPI Lines: Central line and yes and it is still needed Foley:  Yes, and it is still needed Code Status:  full code Last date of multidisciplinary goals of care discussion: full code.  Wife updated at bedside 12/1 on plan of care.  Critical care time: 32 minutes    Hayden Pedro, AGACNP-BC [facility]    Please see [email or web address] for pager details.   From 7A-7P if no response, please call [phone] After hours, please call ELink [phone]

## 2022-04-13 NOTE — Progress Notes (Signed)
Pharmacy Brief Note - Evening Anticoagulation Follow Up:  Pt is a 75 yoM on heparin drip for PE and DVT. Pt hospitalization complicated by large pericolonic hematoma, s/p massive transfusion protocol. Heparin was resumed on 11/29 with no bolus. For full history, see note by Suzzanne Cloud, PharmD from earlier today.   Assessment: HL = 0.22 is subtherapeutic and decreased despite increasing rate of heparin to 1650 units/hr Confirmed with RN that heparin infusing at correct rate. No interruptions/line issues. No overt signs of bleeding - pt did have a scant amount of blood around Foley today due to some irritation, but bleeding resolved quickly.   Goal: HL 0.3 - 0.7  Plan: No boluses per consult instructions Increase heparin infusion to 1850 units/hr Check HL 6 hours after rate change HL, CBC daily  Monitor for signs of bleeding  Lenis Noon, PharmD 04/13/22 2:29 PM

## 2022-04-13 NOTE — Progress Notes (Signed)
Pharmacy Note regarding Electrolyte Replacement  Today, 04/13/22 Potassium: 3.6 Magnesium: 1.9 Diet: NPO  Plan: Per Elink Protocol KCl 10 mEq q1h x 4  Magnesium 2 g IV x 1 Recheck BMET with AM labs   Thank you for allowing pharmacy to be a part of this patient's care.  Royetta Asal, PharmD, BCPS Clinical Pharmacist [place] Please utilize Amion for appropriate phone number to reach the unit pharmacist ([place]) 04/13/2022 11:05 AM

## 2022-04-13 NOTE — Evaluation (Signed)
Occupational Therapy Evaluation Patient Details Name: Kyle Delacruz MRN: [id number] DOB: 01-Aug-1955 Today's Date: 04/13/2022   History of Present Illness Pt is a 66yo male presenting to [facility] ED on 11/26 with worsening SOB and RLE swelling with revealed PE, also constipation. Pt is s/p L2-L5 decompression & fusion 11/22. Underwent emergency exploratory laparatomy with total colectomy and end ileostomy on 11/28, also intubated 11/28. Extubated 11/30.  PMH: DM, hx of DVT 07/2021, GERD, gout, HLD, hypothyroidism, OSA, OSA non-compliant with CPAP, b/l TKA R-1997 L-2001, b/l THA R-2006 L-2009.   Clinical Impression   Kyle Delacruz is a 66 year old man who presents with pain, spinal precautions from recent lumbar surgery, abdominal incision and new colostomy with generalized weakness, decreased activity tolerance and impaired cardiopulmonary endurance. On evaluation he required min assist to transfer to edge of bed, stand and transfer to recliner. He needed +2 assistance to manage all of his lines. He was shaky and unsteady with transfer despite use of walker. He needs max-total assist for LB ADLs. He is currently NPO with an NG tube. Patient will benefit from skilled OT services while in hospital to improve deficits and learn compensatory strategies as needed in order to return to PLOF.  Patient's wife adamant about going home at discharge. Will recommend [facility] services.      Recommendations for follow up therapy are one component of a multi-disciplinary discharge planning process, led by the attending physician.  Recommendations may be updated based on patient status, additional functional criteria and insurance authorization.   Follow Up Recommendations  Home health OT     Assistance Recommended at Discharge Frequent or constant Supervision/Assistance  Patient can return home with the following A little help with walking and/or transfers;A lot of help with bathing/dressing/bathroom;Assistance with  cooking/housework;Help with stairs or ramp for entrance    Functional Status Assessment  Patient has had a recent decline in their functional status and demonstrates the ability to make significant improvements in function in a reasonable and predictable amount of time.  Equipment Recommendations  None recommended by OT    Recommendations for Other Services       Precautions / Restrictions Precautions Precautions: Fall;Back;Other (comment) (NG tube, colostomy, abdominal incision) Precaution Booklet Issued: No Precaution Comments: Reviewed verbally Restrictions Weight Bearing Restrictions: No Other Position/Activity Restrictions: wbat. Unable to wear spinal brace due to new abdominal incision and colostomy      Mobility Bed Mobility Overal bed mobility: Needs Assistance Bed Mobility: Rolling, Sidelying to Sit Rolling: Min assist Sidelying to sit: Min assist       General bed mobility comments: Min assist via chuck pad for rolling, min assist to elevate trunk, verbal cues for use of bed rail and powering up through elbow.    Transfers Overall transfer level: Needs assistance Equipment used: Rolling walker (2 wheels) Transfers: Sit to/from Stand, Bed to chair/wheelchair/BSC Sit to Stand: Min assist, +2 safety/equipment     Step pivot transfers: Min assist, +2 safety/equipment     General transfer comment: Sit to stand: pt min assist for light lift assist with verbal cues to utilize BUE on RW and to stand up tall. Step pivot: min assist +2 for AD management, steadying, and line management.      Balance Overall balance assessment: Mild deficits observed, not formally tested   Sitting balance-Leahy Scale: Good Sitting balance - Comments: Pt sat EOB without support for ~1mn to recover SpO2 and recover from bed mobility.     Standing balance-Leahy Scale:  Poor Standing balance comment: Pt with trunk flexed forward but able to stand statically with BUE support (no PT  assist) for ~60s.                           ADL either performed or assessed with clinical judgement   ADL Overall ADL's : Needs assistance/impaired Eating/Feeding: NPO   Grooming: Set up;Sitting   Upper Body Bathing: Moderate assistance;Sitting   Lower Body Bathing: Total assistance;Sit to/from stand   Upper Body Dressing : Moderate assistance;Sitting   Lower Body Dressing: Total assistance;Sit to/from stand   Toilet Transfer: Minimal assistance;+2 for safety/equipment Toilet Transfer Details (indicate cue type and reason): requires +2 asssistance to manage lines/leads - min assist for steadying adn walker to trasnfer Toileting- Clothing Manipulation and Hygiene: Total assistance Toileting - Clothing Manipulation Details (indicate cue type and reason): colostomy, catheter     Functional mobility during ADLs: Minimal assistance;+2 for safety/equipment       Vision   Vision Assessment?: No apparent visual deficits     Perception     Praxis      Pertinent Vitals/Pain Pain Assessment Pain Assessment: 0-10 Pain Score: 7  Pain Location: abdomen, low back Pain Descriptors / Indicators: Operative site guarding, Sore Pain Intervention(s): Monitored during session     Hand Dominance     Extremity/Trunk Assessment Upper Extremity Assessment Upper Extremity Assessment: Overall WFL for tasks assessed   Lower Extremity Assessment Lower Extremity Assessment: Defer to PT evaluation   Cervical / Trunk Assessment Cervical / Trunk Assessment: Back Surgery;Other exceptions Cervical / Trunk Exceptions: abdominal surgery   Communication Communication Communication: No difficulties   Cognition Arousal/Alertness: Awake/alert Behavior During Therapy: Flat affect Overall Cognitive Status: Within Functional Limits for tasks assessed                                 General Comments: Pt quiet with eyes closed, answered with short phrases as his throat  was causing him pain.     General Comments  Pt on 2LO2 at start of session. RA for the duration, remained at >/=90% though with cues to perform pursed lip breathing. Wife Solmon Ice and friend present    Exercises     Shoulder Instructions      Home Living Family/patient expects to be discharged to:: Private residence Living Arrangements: Spouse/significant other Available Help at Discharge: Family;Available 24 hours/day Type of Home: House Home Access: Stairs to enter [employer] of Steps: 4 Entrance Stairs-Rails: Right;Left;Can reach both Home Layout: Two level;Bed/bath upstairs;Able to live on main level with bedroom/bathroom;Full bath on main level Alternate Level Stairs-Number of Steps: 16 Alternate Level Stairs-Rails: Right Bathroom Shower/Tub: Occupational psychologist: Standard Bathroom Accessibility: Yes   Home Equipment: Conservation officer, nature (2 wheels);Cane - single point;Shower seat - built in;BSC/3in1   Additional Comments: Wife provided majority of home environment info      Prior Functioning/Environment Prior Level of Function : Independent/Modified Independent             Mobility Comments: Pt utilizing RW after lumbar surgery, assist with transfers and stairs ADLs Comments: Supervision for bathing        OT Problem List: Decreased activity tolerance;Impaired balance (sitting and/or standing);Decreased knowledge of use of DME or AE;Decreased knowledge of precautions;Obesity;Cardiopulmonary status limiting activity      OT Treatment/Interventions: Self-care/ADL training;DME and/or AE instruction;Therapeutic activities;Balance training;Patient/family education;Therapeutic exercise  OT Goals(Current goals can be found in the care plan section) Acute Rehab OT Goals Patient Stated Goal: go home at discharge OT Goal Formulation: With patient/family Time For Goal Achievement: 04/27/22 Potential to Achieve Goals: Good  OT Frequency: Min  2X/week    Co-evaluation PT/OT/SLP Co-Evaluation/Treatment: Yes (coeval) Reason for Co-Treatment: Complexity of the patient's impairments (multi-system involvement);To address functional/ADL transfers;For patient/therapist safety PT goals addressed during session: Mobility/safety with mobility OT goals addressed during session: ADL's and self-care      AM-PAC OT "6 Clicks" Daily Activity     Outcome Measure Help from another person eating meals?: Total (NPO) Help from another person taking care of personal grooming?: A Little Help from another person toileting, which includes using toliet, bedpan, or urinal?: Total Help from another person bathing (including washing, rinsing, drying)?: A Lot Help from another person to put on and taking off regular upper body clothing?: A Lot Help from another person to put on and taking off regular lower body clothing?: Total 6 Click Score: 10   End of Session Equipment Utilized During Treatment: Rolling walker (2 wheels);Oxygen Nurse Communication: Mobility status  Activity Tolerance: Patient limited by pain Patient left: in chair;with call bell/phone within reach;with nursing/sitter in room;with family/visitor present  OT Visit Diagnosis: Pain                Time: 3235-5732 OT Time Calculation (min): 25 min Charges:  OT General Charges $OT Visit: 1 Visit OT Evaluation $OT Eval Moderate Complexity: 1 Mod  Gustavo Lah, OTR/L Acute Care Rehab Services  Office [phone]   Lenward Chancellor 04/13/2022, 1:16 PM

## 2022-04-14 DIAGNOSIS — I2699 Other pulmonary embolism without acute cor pulmonale: Secondary | ICD-10-CM | POA: Diagnosis not present

## 2022-04-14 LAB — TYPE AND SCREEN
ABO/RH(D): AB POS
Antibody Screen: NEGATIVE
Unit division: 0
Unit division: 0
Unit division: 0
Unit division: 0
Unit division: 0
Unit division: 0
Unit division: 0
Unit division: 0
Unit division: 0
Unit division: 0
Unit division: 0
Unit division: 0
Unit division: 0
Unit division: 0

## 2022-04-14 LAB — BASIC METABOLIC PANEL
Anion gap: 11 (ref 5–15)
BUN: 9 mg/dL (ref 8–23)
CO2: 24 mmol/L (ref 22–32)
Calcium: 8.1 mg/dL — ABNORMAL LOW (ref 8.9–10.3)
Chloride: 104 mmol/L (ref 98–111)
Creatinine, Ser: 1.01 mg/dL (ref 0.61–1.24)
GFR, Estimated: >60 mL/min (ref >60)
Glucose, Bld: 113 mg/dL — ABNORMAL HIGH (ref 70–99)
Potassium: 3.5 mmol/L (ref 3.5–5.1)
Sodium: 139 mmol/L (ref 135–145)

## 2022-04-14 LAB — BPAM RBC
Blood Product Expiration Date: 202312202359
Blood Product Expiration Date: 202312202359
Blood Product Expiration Date: 202312202359
Blood Product Expiration Date: 202312212359
Blood Product Expiration Date: 202312212359
Blood Product Expiration Date: 202312212359
Blood Product Expiration Date: 202312212359
Blood Product Expiration Date: 202312222359
Blood Product Expiration Date: 202312222359
Blood Product Expiration Date: 202312222359
Blood Product Expiration Date: 202312222359
Blood Product Expiration Date: 202312222359
Blood Product Expiration Date: 202312222359
Blood Product Expiration Date: 202312232359
ISSUE DATE / TIME: 202311281811
ISSUE DATE / TIME: 202311281835
ISSUE DATE / TIME: 202311281835
ISSUE DATE / TIME: 202311281902
ISSUE DATE / TIME: 202311281902
ISSUE DATE / TIME: 202311281902
ISSUE DATE / TIME: 202311282058
ISSUE DATE / TIME: 202311291439
ISSUE DATE / TIME: 202311300839
ISSUE DATE / TIME: 202311301011
Unit Type and Rh: 6200
Unit Type and Rh: 6200
Unit Type and Rh: 6200
Unit Type and Rh: 6200
Unit Type and Rh: 6200
Unit Type and Rh: 6200
Unit Type and Rh: 6200
Unit Type and Rh: 6200
Unit Type and Rh: 6200
Unit Type and Rh: 6200
Unit Type and Rh: 6200
Unit Type and Rh: 6200
Unit Type and Rh: 6200
Unit Type and Rh: 6200

## 2022-04-14 LAB — HEMOGLOBIN AND HEMATOCRIT, BLOOD
HCT: 28.7 % — ABNORMAL LOW (ref 39.0–52.0)
HCT: 27.3 % — ABNORMAL LOW (ref 39.0–52.0)
Hemoglobin: 9.4 g/dL — ABNORMAL LOW (ref 13.0–17.0)
Hemoglobin: 8.8 g/dL — ABNORMAL LOW (ref 13.0–17.0)

## 2022-04-14 LAB — GLUCOSE, CAPILLARY
Glucose-Capillary: 101 mg/dL — ABNORMAL HIGH (ref 70–99)
Glucose-Capillary: 107 mg/dL — ABNORMAL HIGH (ref 70–99)
Glucose-Capillary: 98 mg/dL (ref 70–99)
Glucose-Capillary: 105 mg/dL — ABNORMAL HIGH (ref 70–99)
Glucose-Capillary: 106 mg/dL — ABNORMAL HIGH (ref 70–99)
Glucose-Capillary: 99 mg/dL (ref 70–99)

## 2022-04-14 LAB — CBC
HCT: 26.7 % — ABNORMAL LOW (ref 39.0–52.0)
Hemoglobin: 8.8 g/dL — ABNORMAL LOW (ref 13.0–17.0)
MCH: 29.8 pg (ref 26.0–34.0)
MCHC: 33 g/dL (ref 30.0–36.0)
MCV: 90.5 fL (ref 80.0–100.0)
Platelets: 145 10*3/uL — ABNORMAL LOW (ref 150–400)
RBC: 2.95 MIL/uL — ABNORMAL LOW (ref 4.22–5.81)
RDW: 15.9 % — ABNORMAL HIGH (ref 11.5–15.5)
WBC: 11.7 10*3/uL — ABNORMAL HIGH (ref 4.0–10.5)
nRBC: 0 % (ref 0.0–0.2)

## 2022-04-14 LAB — OSMOLALITY: Osmolality: 271 mOsm/kg — ABNORMAL LOW (ref 275–295)

## 2022-04-14 LAB — OSMOLALITY, URINE: Osmolality, Ur: 281 mOsm/kg — ABNORMAL LOW (ref 300–900)

## 2022-04-14 LAB — HEPARIN LEVEL (UNFRACTIONATED)
Heparin Unfractionated: 0.22 IU/mL — ABNORMAL LOW (ref 0.30–0.70)
Heparin Unfractionated: 0.36 [IU]/mL (ref 0.30–0.70)
Heparin Unfractionated: 0.32 [IU]/mL (ref 0.30–0.70)

## 2022-04-14 LAB — PHOSPHORUS: Phosphorus: 3.7 mg/dL (ref 2.5–4.6)

## 2022-04-14 MED ORDER — MAGIC MOUTHWASH W/LIDOCAINE
5.0000 mL | Freq: Three times a day (TID) | ORAL | Status: DC | PRN
  Administered 2022-04-14 – 2022-04-15 (×3): 5 mL via ORAL
  Filled 2022-04-14 (×6): qty 5

## 2022-04-14 MED ORDER — ORAL CARE MOUTH RINSE: 15.0000 mL | OROMUCOSAL | Status: DC | PRN

## 2022-04-14 NOTE — Progress Notes (Signed)
ANTICOAGULATION CONSULT NOTE - Follow Up Consult  Pharmacy Consult for Heparin Indication: pulmonary embolus and DVT  Allergies  Allergen Reactions   Bee Venom     Other reaction(s): Unknown   Lisinopril     Other reaction(s): Unknown   Metoclopramide Other (See Comments)    Other reaction(s): shaking too bad "Sweat like crazy"    Penicillins Hives    Did it involve swelling of the face/tongue/throat, SOB, or low BP? Yes Did it involve sudden or severe rash/hives, skin peeling, or any reaction on the inside of your mouth or nose? Yes Did you need to seek medical attention at a hospital or doctor's office? No When did it last happen?  childhood      If all above answers are "NO", may proceed with cephalosporin use.     Shellfish Allergy Swelling    Patient Measurements: Height: '5\' 10"'$  (177.8 cm) Weight: 111.2 kg (245 lb 2.4 oz) IBW/kg (Calculated) : 73 Heparin Dosing Weight: 94 kg  Vital Signs: Temp: 98.8 F (37.1 C) (12/02 0300) Temp Source: Bladder (12/02 0000) BP: 125/54 (12/02 0300) Pulse Rate: 102 (12/02 0300)  Labs: Recent Labs    04/12/22 0240 04/12/22 0943 04/12/22 1515 04/13/22 0845 04/13/22 1826 04/14/22 0236  HGB 10.6*   < > 10.0* 8.9* 9.5* 8.8*  HCT 30.5*   < > 29.4* 26.7* 28.6* 26.7*  PLT 145*  --   --  144*  --  145*  HEPARINUNFRC 0.56   < >  --  0.26* 0.22* 0.22*  CREATININE 1.22  --  1.10 0.90  --   --    < > = values in this interval not displayed.     Estimated Creatinine Clearance: 103.6 mL/min (by C-G formula based on SCr of 0.9 mg/dL).  Assessment: 71 yoM presented on 11/26 for cough, SOB, abdominal pain.  PMH significant for DVT/PE after previous cervical spine surgery 07/17/21 and he completed a course of apixaban in August.  He had a recent L2-L5 fusion on 04/04/22 and had a postop plan to be on Lovenox '40mg'$  (filled 04/04/22 x7 days).  Today, CTa is positive for acute PE with right heart strain and postop ileus.  Dopplers are positive  for DVT in RLE.   Significant Events: Heparin stopped 11/28 for hemoperitoneum, colonic ileus 11/28 to OR: exp lap. TAC, end ileostomy . Intra-op source of bleeding found to be mesentery of hepatic flexure S/p massive transfusion protocol 11/28 & 1 dose of TXA> latest Hg 10.8 & PLT 125 11/29 heparin resumed at1400 with no bolus per CCS & CCM  11/30 Extubated  04/14/2022 HL 0.22 despite earlier increase to 1850 units/hr CBC low but stable Per RN no bleeding and no interruptions  Goal of Therapy:  Heparin level 0.3-0.7 units/ml Monitor platelets by anticoagulation protocol: Yes   Plan:  Increase heparin infusion to 2050 units/hr Then obtain 6 hour heparin level Monitor daily heparin level, CBC, signs/symptoms of bleeding   Thank you for allowing pharmacy to be a part of this patient's care.  Dolly Rias RPh 04/14/2022, 3:19 AM

## 2022-04-14 NOTE — Progress Notes (Signed)
ANTICOAGULATION CONSULT NOTE - Follow Up Consult  Pharmacy Consult for Heparin Indication: pulmonary embolus and DVT  Allergies  Allergen Reactions   Bee Venom     Other reaction(s): Unknown   Lisinopril     Other reaction(s): Unknown   Metoclopramide Other (See Comments)    Other reaction(s): shaking too bad "Sweat like crazy"    Penicillins Hives    Did it involve swelling of the face/tongue/throat, SOB, or low BP? Yes Did it involve sudden or severe rash/hives, skin peeling, or any reaction on the inside of your mouth or nose? Yes Did you need to seek medical attention at a hospital or doctor's office? No When did it last happen?  childhood      If all above answers are "NO", may proceed with cephalosporin use.     Shellfish Allergy Swelling    Patient Measurements: Height: '5\' 10"'$  (177.8 cm) Weight: 111 kg (244 lb 11.4 oz) IBW/kg (Calculated) : 73 Heparin Dosing Weight: 94 kg  Vital Signs: Temp: 99.3 F (37.4 C) (12/02 1930) BP: 168/64 (12/02 1930) Pulse Rate: 107 (12/02 1930)  Labs: Recent Labs    04/12/22 0240 04/12/22 0943 04/12/22 1515 04/13/22 0845 04/13/22 1826 04/14/22 0236 04/14/22 0900 04/14/22 1000 04/14/22 1950  HGB 10.6*   < > 10.0* 8.9*   < > 8.8* 9.4*  --  8.8*  HCT 30.5*   < > 29.4* 26.7*   < > 26.7* 28.7*  --  27.3*  PLT 145*  --   --  144*  --  145*  --   --   --   HEPARINUNFRC 0.56   < >  --  0.26*   < > 0.22*  --  0.36 0.32  CREATININE 1.22  --  1.10 0.90  --  1.01  --   --   --    < > = values in this interval not displayed.     Estimated Creatinine Clearance: 92.2 mL/min (by C-G formula based on SCr of 1.01 mg/dL).  Assessment: 59 yoM presented on 11/26 for cough, SOB, abdominal pain.  PMH significant for DVT/PE after previous cervical spine surgery 07/17/21 and he completed a course of apixaban in August.  He had a recent L2-L5 fusion on 04/04/22 and had a postop plan to be on Lovenox '40mg'$  (filled 04/04/22 x7 days).  Today, CTa is  positive for acute PE with right heart strain and postop ileus.  Dopplers are positive for DVT in RLE.   Significant Events: Heparin stopped 11/28 for hemoperitoneum, colonic ileus 11/28 to OR: exp lap. TAC, end ileostomy . Intra-op source of bleeding found to be mesentery of hepatic flexure S/p massive transfusion protocol 11/28 & 1 dose of TXA> latest Hg 10.8 & PLT 125 11/29 heparin resumed at1400 with no bolus per CCS & CCM  11/30 Extubated  04/14/2022 Heparin level therapeutic at 0.32  with heparin infusing at 2050 units/hr CBC low but stable No bleeding or interruptions with infusion documented  Goal of Therapy:  Heparin level 0.3-0.7 units/ml Monitor platelets by anticoagulation protocol: Yes   Plan:  Continue heparin infusion at 2050 units/hr Monitor daily heparin level, CBC, signs/symptoms of bleeding   Thank you for allowing pharmacy to be a part of this patient's care.  Peggyann Juba, PharmD, BCPS Please utilize Amion for appropriate phone number to reach the unit pharmacist ([place]) 04/14/2022 8:33 PM

## 2022-04-14 NOTE — Progress Notes (Signed)
4 Days Post-Op   Subjective/Chief Complaint: Complains of soreness. No output from ostomy yet   Objective: Vital signs in last 24 hours: Temp:  [98 F (36.7 C)-99.3 F (37.4 C)] 98.6 F (37 C) (12/02 0630) Pulse Rate:  [61-214] 91 (12/02 0630) Resp:  [9-22] 19 (12/02 0630) BP: (77-178)/(41-80) 135/57 (12/02 0630) SpO2:  [91 %-100 %] 98 % (12/02 0630) Weight:  [616 kg] 111 kg (12/02 0457) Last BM Date : 04/13/22  Intake/Output from previous day: 12/01 0701 - 12/02 0700 In: 2151.8 [P.O.:9; I.V.:1518.7; NG/GT:50; IV Piggyback:574.1] Out: 0737 [TGGYI:9485; Emesis/NG output:1550] Intake/Output this shift: No intake/output data recorded.  General appearance: alert and cooperative Resp: clear to auscultation bilaterally Cardio: regular rate and rhythm GI: distended, quiet. Ostomy pink. Incision ok  Lab Results:  Recent Labs    04/13/22 0845 04/13/22 1826 04/14/22 0236  WBC 11.6*  --  11.7*  HGB 8.9* 9.5* 8.8*  HCT 26.7* 28.6* 26.7*  PLT 144*  --  145*   BMET Recent Labs    04/13/22 0845 04/14/22 0236  NA 128* 139  K 3.6 3.5  CL 93* 104  CO2 29 24  GLUCOSE 115* 113*  BUN 12 9  CREATININE 0.90 1.01  CALCIUM 7.5* 8.1*   PT/INR No results for input(s): "LABPROT", "INR" in the last 72 hours. ABG Recent Labs    04/11/22 0816 04/11/22 1026  PHART 7.56* 7.47*  HCO3 24.2 27.7    Studies/Results: No results found.  Anti-infectives: Anti-infectives (From admission, onward)    Start     Dose/Rate Route Frequency Ordered Stop   04/10/22 2107  metroNIDAZOLE (FLAGYL) 500 MG/100ML IVPB       Note to Pharmacy: Enis Gash W: cabinet override      04/10/22 2107 04/10/22 2114   04/10/22 2103  ceFAZolin (ANCEF) 2-4 GM/100ML-% IVPB       Note to Pharmacy: Enis Gash W: cabinet override      04/10/22 2103 04/11/22 0914       Assessment/Plan: s/p Procedure(s): EXPLORATORY LAPAROTOMY, TOTAL COLECTOMY, END ILEOSTOMY (N/A) Continue ng and bowel rest  until return of bowel function EXPLORATORY LAPAROTOMY, TOTAL COLECTOMY, END ILEOSTOMY 04/10/2022  -minimal ostomy drainage   FEN - continue NG tube, ok to clamp for meds VTE - heparin drip ID - no issues Disposition - ICU for monitoring, remains extubated   LOS: 6 days    Kyle Delacruz 04/14/2022

## 2022-04-14 NOTE — Progress Notes (Deleted)
[place] for Heparin Indication: pulmonary embolus and DVT  Allergies  Allergen Reactions   Bee Venom     Other reaction(s): Unknown   Lisinopril     Other reaction(s): Unknown   Metoclopramide Other (See Comments)    Other reaction(s): shaking too bad "Sweat like crazy"    Penicillins Hives    Did it involve swelling of the face/tongue/throat, SOB, or low BP? Yes Did it involve sudden or severe rash/hives, skin peeling, or any reaction on the inside of your mouth or nose? Yes Did you need to seek medical attention at a hospital or doctor's office? No When did it last happen?  childhood      If all above answers are "NO", may proceed with cephalosporin use.     Shellfish Allergy Swelling    Patient Measurements: Height: '5\' 10"'$  (177.8 cm) Weight: 111 kg (244 lb 11.4 oz) IBW/kg (Calculated) : 73 Heparin Dosing Weight: 94 kg  Vital Signs: Temp: 99 F (37.2 C) (12/02 1800) Temp Source: Bladder (12/02 0800) BP: 173/66 (12/02 1800) Pulse Rate: 97 (12/02 1800)  Labs: Recent Labs    04/12/22 0240 04/12/22 0943 04/12/22 1515 04/13/22 0845 04/13/22 1826 04/14/22 0236 04/14/22 0900 04/14/22 1000  HGB 10.6*   < > 10.0* 8.9* 9.5* 8.8* 9.4*  --   HCT 30.5*   < > 29.4* 26.7* 28.6* 26.7* 28.7*  --   PLT 145*  --   --  144*  --  145*  --   --   HEPARINUNFRC 0.56   < >  --  0.26* 0.22* 0.22*  --  0.36  CREATININE 1.22  --  1.10 0.90  --  1.01  --   --    < > = values in this interval not displayed.     Estimated Creatinine Clearance: 92.2 mL/min (by C-G formula based on SCr of 1.01 mg/dL).  Assessment: 15 yoM presented on 11/26 for cough, SOB, abdominal pain.  PMH significant for DVT/PE after previous cervical spine surgery 07/17/21 and he completed a course of apixaban in August.  He had a recent L2-L5 fusion on 04/04/22 and had a postop plan to be on Lovenox '40mg'$  (filled 04/04/22 x7 days).  Today, CTa is positive for acute PE with  right heart strain and postop ileus.  Dopplers are positive for DVT in RLE.   Significant Events: Heparin stopped 11/28 for hemoperitoneum, colonic ileus 11/28 to OR: exp lap. TAC, end ileostomy . Intra-op source of bleeding found to be mesentery of hepatic flexure S/p massive transfusion protocol 11/28 & 1 dose of TXA> latest Hg 10.8 & PLT 125 11/29 heparin resumed at1400 with no bolus per CCS & CCM  11/30 Extubated  04/14/2022 -HL remains therapeutic with heparin infusing at 2050 units/hr -CBC low but stable -No bleeding or infusion issues noted  Goal of Therapy:  Heparin level 0.3-0.7 units/ml Monitor platelets by anticoagulation protocol: Yes   Plan:  -Continue heparin infusion at 2050 units/hr -Daily heparin level and CBC -Monitor for signs/symptoms of bleeding  Tawnya Crook, PharmD, BCPS Clinical Pharmacist 04/14/2022 6:45 PM

## 2022-04-14 NOTE — Progress Notes (Signed)
1830 H&H and heparin level drawn and sent to lab at [facility].

## 2022-04-14 NOTE — Progress Notes (Signed)
NAME:  Kyle Delacruz, MRN:  [id number], DOB:  09-Jun-1955, LOS: 6 ADMISSION DATE:  04/08/2022, CONSULTATION DATE:  04/10/22 REFERRING MD:  Lonny Prude, CHIEF COMPLAINT:  abd pain   History of Present Illness:  66 year old man with hx of provoked DVT who presents after lumbar surgery with worsening SOB and RLE swelling.  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  Echo/CTA showing some RV strain but nothing alarming.  Hospital course complicated by severe constipation vs. Colonic ileus partially relieved with enema 04/10/22.  He developed searing epigastric pain with radiation to shoulder, and worsened with movement after enema.    He subsequently developed worsening hypoxia, tachycardia and hypotension requiring intubation on 11/28.  CT results showed a large pericolonic hematoma tracking into the peritoneum and right hemiliver.  Mass transfusion protocol was activated and he was evaluated by General Surgery who took him for an emergency ex lap with total colectomy and end ileostomy.  Pertinent  Medical History  Recurrent VTE DM2 HLD Hypothyroidism OSA  Significant Hospital Events: Including procedures, antibiotic start and stop dates in addition to other pertinent events   11/26 Admit 11/28 PCCM consult, intubated, emergency ex-lap, requiring pressors 11/29 Heparin restarted 11/30 Extubated  12/1 weaned off levophed  Interim History / Subjective:  Weaned off levophed Complains of throat soreness Abdomen still does feel great, no nausea  Wife is at bedside  Objective   Blood pressure (!) 135/57, pulse 91, temperature 98.6 F (37 C), resp. rate 19, height '5\' 10"'$  (1.778 m), weight 111 kg, SpO2 98 %.        Intake/Output Summary (Last 24 hours) at 04/14/2022 0743 Last data filed at 04/14/2022 0630 Gross per 24 hour  Intake 2151.79 ml  Output 8030 ml  Net -5878.21 ml   Filed Weights   04/12/22 0423 04/13/22 0416 04/14/22 0457  Weight: 112.1 kg 111.2 kg 111 kg    Examination: General:  sitting up in chair, no acute distress HEENT: Pillow/AT, moist mucous membranes, sclera anicteric Neuro: A&O x 3, moving all extremities CV: rrr, s1s2, no murmurs PULM: clear to auscultation bilaterally. No wheezing GI: soft, non-tender, non-distended, decreased BS+ Extremities: warm, 1+ edema Skin: no rashes     Resolved Hospital Problem list   Acute Hypoxic Respiratory Failure Fever  Assessment & Plan:   Hemorrhagic Shock Intra-abdominal Hematoma s/p Ex-Lap Post-op colonic ileus Acute Blood Loss Anemia Mass Transfusion protocol and emergent ex-lap with total colectomy and end ileostomy on 11/28 Plan -Ostomy care / NGT per CCS  -defer timing of feeding to CCS  -WOC for ostomy -strict I/O's   Recurrent Provoked VTE on AC -continue heparin for PE   AKI  Baseline creatinine 0.9-1.1 Plan -Trend BMP / urinary output -Replace electrolytes as indicated -Avoid nephrotoxic agents, ensure adequate renal perfusion  Hypokalemia Hypomagnesemia -follow electrolytes, replace as indicated > AM labs penidng   DM II  -hemoglobin AIC 5.9 Plan -SSI, sensitive scale   Hypothyroidism -hold home synthroid for now  HLD -hold home statin   Post-op L2-5 Decompression and fusion -PT / OT when able  -supportive care  -pain control   At Risk Malnutrition -defer timing of feeding to CCS -oral care   Best Practice (right click and "Reselect all SmartList Selections" daily)  Diet/type: NPO DVT prophylaxis: systemic heparin, TED hose GI prophylaxis: PPI Lines: Central line and No longer needed.  Order written to d/c  Foley:  Yes, and it is still needed Code Status:  full code Last date of multidisciplinary goals  of care discussion: full code.  Wife updated at bedside 12/2 on plan of care.  Critical care time:  n/a    Freda Jackson, MD [facility] Office: [phone]   See Amion for personal pager PCCM on call pager [phone] until 7pm. Please  call [facility] 7p-7a. [phone]

## 2022-04-14 NOTE — Progress Notes (Addendum)
ANTICOAGULATION CONSULT NOTE - Follow Up Consult  Pharmacy Consult for Heparin Indication: pulmonary embolus and DVT  Allergies  Allergen Reactions   Bee Venom     Other reaction(s): Unknown   Lisinopril     Other reaction(s): Unknown   Metoclopramide Other (See Comments)    Other reaction(s): shaking too bad "Sweat like crazy"    Penicillins Hives    Did it involve swelling of the face/tongue/throat, SOB, or low BP? Yes Did it involve sudden or severe rash/hives, skin peeling, or any reaction on the inside of your mouth or nose? Yes Did you need to seek medical attention at a hospital or doctor's office? No When did it last happen?  childhood      If all above answers are "NO", may proceed with cephalosporin use.     Shellfish Allergy Swelling    Patient Measurements: Height: '5\' 10"'$  (177.8 cm) Weight: 111 kg (244 lb 11.4 oz) IBW/kg (Calculated) : 73 Heparin Dosing Weight: 94 kg  Vital Signs: Temp: 98.6 F (37 C) (12/02 1000) Temp Source: Bladder (12/02 0800) BP: 128/55 (12/02 1000) Pulse Rate: 93 (12/02 1000)  Labs: Recent Labs    04/12/22 0240 04/12/22 0943 04/12/22 1515 04/13/22 0845 04/13/22 1826 04/14/22 0236 04/14/22 0900 04/14/22 1000  HGB 10.6*   < > 10.0* 8.9* 9.5* 8.8* 9.4*  --   HCT 30.5*   < > 29.4* 26.7* 28.6* 26.7* 28.7*  --   PLT 145*  --   --  144*  --  145*  --   --   HEPARINUNFRC 0.56   < >  --  0.26* 0.22* 0.22*  --  0.36  CREATININE 1.22  --  1.10 0.90  --  1.01  --   --    < > = values in this interval not displayed.     Estimated Creatinine Clearance: 92.2 mL/min (by C-G formula based on SCr of 1.01 mg/dL).  Assessment: 42 yoM presented on 11/26 for cough, SOB, abdominal pain.  PMH significant for DVT/PE after previous cervical spine surgery 07/17/21 and he completed a course of apixaban in August.  He had a recent L2-L5 fusion on 04/04/22 and had a postop plan to be on Lovenox '40mg'$  (filled 04/04/22 x7 days).  Today, CTa is positive  for acute PE with right heart strain and postop ileus.  Dopplers are positive for DVT in RLE.   Significant Events: Heparin stopped 11/28 for hemoperitoneum, colonic ileus 11/28 to OR: exp lap. TAC, end ileostomy . Intra-op source of bleeding found to be mesentery of hepatic flexure S/p massive transfusion protocol 11/28 & 1 dose of TXA> latest Hg 10.8 & PLT 125 11/29 heparin resumed at1400 with no bolus per CCS & CCM  11/30 Extubated  04/14/2022 10:00 HL therapeutic at 0.36  with heparin infusing at 2050 units/hr CBC low but stable Per RN no bleeding and no interruptions  Goal of Therapy:  Heparin level 0.3-0.7 units/ml Monitor platelets by anticoagulation protocol: Yes   Plan:  Continue heparin infusion at 2050 units/hr Obtain 6 hour confirmatory heparin level Monitor daily heparin level, CBC, signs/symptoms of bleeding   Thank you for allowing pharmacy to be a part of this patient's care.  Royetta Asal, PharmD, BCPS Clinical Pharmacist [place] Please utilize Amion for appropriate phone number to reach the unit pharmacist ([place]) 04/14/2022 2:10 PM

## 2022-04-15 ENCOUNTER — Telehealth: Payer: Self-pay | Admitting: Pulmonary Disease

## 2022-04-15 DIAGNOSIS — J9601 Acute respiratory failure with hypoxia: Secondary | ICD-10-CM | POA: Diagnosis not present

## 2022-04-15 DIAGNOSIS — I2699 Other pulmonary embolism without acute cor pulmonale: Secondary | ICD-10-CM | POA: Diagnosis not present

## 2022-04-15 DIAGNOSIS — I82411 Acute embolism and thrombosis of right femoral vein: Secondary | ICD-10-CM | POA: Diagnosis not present

## 2022-04-15 DIAGNOSIS — N179 Acute kidney failure, unspecified: Secondary | ICD-10-CM | POA: Diagnosis not present

## 2022-04-15 LAB — GLUCOSE, CAPILLARY
Glucose-Capillary: 108 mg/dL — ABNORMAL HIGH (ref 70–99)
Glucose-Capillary: 108 mg/dL — ABNORMAL HIGH (ref 70–99)
Glucose-Capillary: 108 mg/dL — ABNORMAL HIGH (ref 70–99)
Glucose-Capillary: 110 mg/dL — ABNORMAL HIGH (ref 70–99)
Glucose-Capillary: 115 mg/dL — ABNORMAL HIGH (ref 70–99)
Glucose-Capillary: 92 mg/dL (ref 70–99)

## 2022-04-15 LAB — CBC
HCT: 26.1 % — ABNORMAL LOW (ref 39.0–52.0)
Hemoglobin: 8.4 g/dL — ABNORMAL LOW (ref 13.0–17.0)
MCH: 29.6 pg (ref 26.0–34.0)
MCHC: 32.2 g/dL (ref 30.0–36.0)
MCV: 91.9 fL (ref 80.0–100.0)
Platelets: 159 10*3/uL (ref 150–400)
RBC: 2.84 MIL/uL — ABNORMAL LOW (ref 4.22–5.81)
RDW: 16.2 % — ABNORMAL HIGH (ref 11.5–15.5)
WBC: 12.8 10*3/uL — ABNORMAL HIGH (ref 4.0–10.5)
nRBC: 0 % (ref 0.0–0.2)

## 2022-04-15 LAB — BASIC METABOLIC PANEL WITH GFR
Anion gap: 11 (ref 5–15)
BUN: 9 mg/dL (ref 8–23)
CO2: 24 mmol/L (ref 22–32)
Calcium: 8 mg/dL — ABNORMAL LOW (ref 8.9–10.3)
Chloride: 106 mmol/L (ref 98–111)
Creatinine, Ser: 0.93 mg/dL (ref 0.61–1.24)
GFR, Estimated: >60 mL/min
Glucose, Bld: 112 mg/dL — ABNORMAL HIGH (ref 70–99)
Potassium: 3.1 mmol/L — ABNORMAL LOW (ref 3.5–5.1)
Sodium: 141 mmol/L (ref 135–145)

## 2022-04-15 LAB — HEMOGLOBIN AND HEMATOCRIT, BLOOD
HCT: 29.8 % — ABNORMAL LOW (ref 39.0–52.0)
HCT: 32.3 % — ABNORMAL LOW (ref 39.0–52.0)
Hemoglobin: 9.6 g/dL — ABNORMAL LOW (ref 13.0–17.0)
Hemoglobin: 10.1 g/dL — ABNORMAL LOW (ref 13.0–17.0)

## 2022-04-15 LAB — HEPARIN LEVEL (UNFRACTIONATED): Heparin Unfractionated: 0.35 IU/mL (ref 0.30–0.70)

## 2022-04-15 LAB — PHOSPHORUS: Phosphorus: 3 mg/dL (ref 2.5–4.6)

## 2022-04-15 MED ORDER — POTASSIUM CHLORIDE IN NACL 20-0.9 MEQ/L-% IV SOLN
INTRAVENOUS | Status: DC
  Filled 2022-04-15 (×8): qty 1000

## 2022-04-15 MED ORDER — POTASSIUM CHLORIDE 10 MEQ/50ML IV SOLN
10.0000 meq | INTRAVENOUS | Status: AC
  Administered 2022-04-15 (×6): 10 meq via INTRAVENOUS
  Filled 2022-04-15 (×6): qty 50

## 2022-04-15 MED ORDER — METOPROLOL TARTRATE 5 MG/5ML IV SOLN
5.0000 mg | Freq: Three times a day (TID) | INTRAVENOUS | Status: DC
  Administered 2022-04-15 – 2022-04-16 (×3): 5 mg via INTRAVENOUS
  Filled 2022-04-15 (×3): qty 5

## 2022-04-15 MED ORDER — HYDRALAZINE HCL 20 MG/ML IJ SOLN
10.0000 mg | Freq: Four times a day (QID) | INTRAMUSCULAR | Status: DC | PRN
  Administered 2022-04-15 (×2): 10 mg via INTRAVENOUS
  Filled 2022-04-15 (×2): qty 1

## 2022-04-15 MED ORDER — LEVOTHYROXINE SODIUM 100 MCG/5ML IV SOLN: 25.0000 ug | Freq: Every day | INTRAVENOUS | Status: DC

## 2022-04-15 MED ORDER — HYDROMORPHONE HCL 1 MG/ML IJ SOLN
1.0000 mg | INTRAMUSCULAR | Status: DC | PRN
  Administered 2022-04-15: 2 mg via INTRAVENOUS
  Administered 2022-04-16 (×2): 1 mg via INTRAVENOUS
  Administered 2022-04-17: 2 mg via INTRAVENOUS
  Administered 2022-04-17: 1 mg via INTRAVENOUS
  Filled 2022-04-15: qty 2
  Filled 2022-04-15: qty 1
  Filled 2022-04-15 (×2): qty 2
  Filled 2022-04-15: qty 1

## 2022-04-15 MED ORDER — GUAIFENESIN-DM 100-10 MG/5ML PO SYRP
5.0000 mL | ORAL_SOLUTION | ORAL | Status: DC | PRN
  Administered 2022-04-15: 5 mL via ORAL
  Filled 2022-04-15 (×2): qty 10

## 2022-04-15 NOTE — Progress Notes (Signed)
[facility] ADULT ICU REPLACEMENT PROTOCOL   The patient does apply for the [facility] Adult ICU Electrolyte Replacment Protocol based on the criteria listed below:   1.Exclusion criteria: TCTS, ECMO, Dialysis, and Myasthenia Gravis patients 2. Is GFR >/= 30 ml/min? Yes.    Patient's GFR today is >60 3. Is SCr </= 2? Yes.   Patient's SCr is 0.93 mg/dL 4. Did SCr increase >/= 0.5 in 24 hours? No. 5.Pt's weight >40kg  Yes.   6. Abnormal electrolyte(s): K  7. Electrolytes replaced per protocol 8.  Call MD STAT for K+ </= 2.5, Phos </= 1, or Mag </= 1 Physician:  Jeannene Patella [facility] 04/15/2022 5:59 AM

## 2022-04-15 NOTE — Telephone Encounter (Signed)
Please schedule patient for 3 month follow up for pulmonary embolus.  Thanks, Wille Glaser

## 2022-04-15 NOTE — Progress Notes (Signed)
[facility]                                                                              Kyle Delacruz, is a 66 y.o. male, DOB - [birth year], GEX:[id number] Admit date - 04/08/2022    Outpatient Primary MD for the patient is [name], [name], MD  LOS - 7  days  Chief Complaint  Patient presents with   Constipation       Brief summary   Patient is a 66 year old male with provoked DVT who presented after lumbar surgery with worsening shortness of breath and RLE swelling. 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  Echo/CTA showing some RV strain. Hospital course complicated by severe constipation vs. colonic ileus partially relieved with enema 04/10/22.  He developed searing epigastric pain with radiation to shoulder, and worsened with movement after enema. He subsequently developed worsening hypoxia, tachycardia and hypotension requiring intubation on 11/28.  CT results showed a large pericolonic hematoma tracking into the peritoneum and right hemiliver.  Mass transfusion protocol was activated and he was evaluated by surgery and underwent emergency ex lap with total colectomy and end ileostomy on 04/10/2022.  Started on IV heparin on 11/29 He was extubated on 11/30 and weaned off of vasopressors on 12/1  [facility] assumed care on 04/15/2022   Assessment & Plan    Principal Problem: Acute submassive PE (pulmonary thromboembolism) (HCC)/ RLE DVT, hemorrhagic shock -CTA chest showed acute PE in the upper lobe branch of right pulmonary artery, CT evidence of right heart strain consistent with submassive PE. -Weaned off of vasopressors, BP now elevated -Continue IV heparin drip - d/w CCM, Dr Erin Fulling, recommended eliquis/DOACs until he is surgically cleared, return of bowel function and no further need for any surgical procedures.  Once medically cleared, transition to DOACs for PE/DVT.  Likely will need prophylactic dosing long term after 3-6 months of full dose treatment.     Active  Problems:   Acute respiratory failure with hypoxia ([place]) - Patient was extubated on 04/12/2022 -Currently stable, O2 sats 93 to 95% on room air  Post colonic ileus, acute blood loss anemia, intra-abdominal hematoma s/ ex lap, hemorrhagic shock -Underwent exploratory laparotomy, total colectomy, end ileostomy on 11/28 -NGT in, surgery following closely, appreciate management -H&H stable, 8.4, transfuse for hemoglobin less than 8 -Pain control, NPO    Hypothyroidism -Resume IV Synthroid 85mg qd (outpatient dose Synthroid 50 mcg p.o. daily)  Accelerated hypertension -Likely now worse due to pain, anxiety, placed on IV Lopressor 5 mg every 8 hours - IV hydralazine as needed with parameters     Gastro-esophageal reflux disease without esophagitis -Continue IV PPI  Hyperglycemia -Continue sliding scale insulin while inpatient.  Not on any oral hypoglycemics outpatient -Hemoglobin A1c 5.9 on 11/30  Hypokalemia -Replaced IV  Obesity Estimated body mass index is 35.05 kg/m as calculated from the following:   Height as of this encounter: '5\' 10"'$  (1.778 m).   Weight as of this encounter: 110.8 kg.  Code Status: Full CODE STATUS DVT Prophylaxis:  Place TED hose Start: 04/09/22 0021   Level of Care: Level of care: ICU Family Communication: Updated patient's wife at the  bedside   Disposition Plan:      Remains inpatient appropriate:   Procedures:  11/28 EXPLORATORY LAPAROTOMY, TOTAL COLECTOMY, END ILEOSTOMY (N/A)  Intubation 11/28, extubated on 11/30  Consultants:   General surgery CCM  Antimicrobials:   Anti-infectives (From admission, onward)    Start     Dose/Rate Route Frequency Ordered Stop   04/10/22 2107  metroNIDAZOLE (FLAGYL) 500 MG/100ML IVPB       Note to Pharmacy: Enis Gash W: cabinet override      04/10/22 2107 04/10/22 2114   04/10/22 2103  ceFAZolin (ANCEF) 2-4 GM/100ML-% IVPB       Note to Pharmacy: Enis Gash W: cabinet override       04/10/22 2103 04/11/22 0914          Medications  acetaminophen  650 mg Oral Q6H   Chlorhexidine Gluconate Cloth  6 each Topical Daily   insulin aspart  0-9 Units Subcutaneous Q4H   pantoprazole (PROTONIX) IV  40 mg Intravenous Daily      Subjective:   Derward Marple was seen and examined today.  BP elevated, complaining of abdominal pain, wife at the bedside.  No fevers or chills, no active nausea vomiting.  NGT+   Objective:   Vitals:   04/15/22 0600 04/15/22 0700 04/15/22 0800 04/15/22 0900  BP: (!) 160/71 (!) 190/78 (!) 183/73 (!) 153/74  Pulse: 100 (!) 102 99 99  Resp: 16 (!) '25 17 13  '$ Temp: 99.1 F (37.3 C) 99.1 F (37.3 C) 99.1 F (37.3 C) 99.1 F (37.3 C)  TempSrc:   Bladder   SpO2: 94% 90% 93% 93%  Weight:      Height:        Intake/Output Summary (Last 24 hours) at 04/15/2022 1114 Last data filed at 04/15/2022 0851 Gross per 24 hour  Intake 2415.79 ml  Output 2084 ml  Net 331.79 ml     Wt Readings from Last 3 Encounters:  04/15/22 110.8 kg  02/14/22 106.6 kg  02/05/22 107 kg     Exam General: Alert and oriented x 3, NAD Cardiovascular: S1 S2 auscultated,  RRR Respiratory: CTA B clearly Gastrointestinal: Soft, dressing intact, distended, BS+, ostomy pink  Ext: no pedal edema bilaterally Neuro: no new FND's    Data Reviewed:  I have personally reviewed following labs    CBC Lab Results  Component Value Date   WBC 12.8 (H) 04/15/2022   RBC 2.84 (L) 04/15/2022   HGB 8.4 (L) 04/15/2022   HCT 26.1 (L) 04/15/2022   MCV 91.9 04/15/2022   MCH 29.6 04/15/2022   PLT 159 04/15/2022   MCHC 32.2 04/15/2022   RDW 16.2 (H) 04/15/2022   LYMPHSABS 1.7 04/08/2022   MONOABS 1.3 (H) 04/08/2022   EOSABS 0.1 04/08/2022   BASOSABS 0.0 76/54/6503     Last metabolic panel Lab Results  Component Value Date   NA 141 04/15/2022   K 3.1 (L) 04/15/2022   CL 106 04/15/2022   CO2 24 04/15/2022   BUN 9 04/15/2022   CREATININE 0.93 04/15/2022    GLUCOSE 112 (H) 04/15/2022   GFRNONAA >60 04/15/2022   GFRAA >60 08/19/2019   CALCIUM 8.0 (L) 04/15/2022   PHOS 3.0 04/15/2022   PROT 6.3 (L) 07/27/2021   ALBUMIN 2.9 (L) 07/27/2021   BILITOT 1.3 (H) 07/27/2021   ALKPHOS 50 07/27/2021   AST 14 (L) 07/27/2021   ALT 13 07/27/2021   ANIONGAP 11 04/15/2022    CBG (last 3)  Recent Labs  04/14/22 2322 04/15/22 0344 04/15/22 0757  GLUCAP 99 115* 110*      Coagulation Profile: No results for input(s): "INR", "PROTIME" in the last 168 hours.   Radiology Studies: I have personally reviewed the imaging studies  No results found.     Estill Cotta M.D. [facility] 04/15/2022, 11:14 AM  Available via Epic secure chat 7am-7pm After 7 pm, please refer to night coverage provider listed on amion.

## 2022-04-15 NOTE — Progress Notes (Signed)
ANTICOAGULATION CONSULT NOTE - Follow Up Consult  Pharmacy Consult for Heparin Indication: pulmonary embolus and DVT  Allergies  Allergen Reactions   Bee Venom     Other reaction(s): Unknown   Lisinopril     Other reaction(s): Unknown   Metoclopramide Other (See Comments)    Other reaction(s): shaking too bad "Sweat like crazy"    Penicillins Hives    Did it involve swelling of the face/tongue/throat, SOB, or low BP? Yes Did it involve sudden or severe rash/hives, skin peeling, or any reaction on the inside of your mouth or nose? Yes Did you need to seek medical attention at a hospital or doctor's office? No When did it last happen?  childhood      If all above answers are "NO", may proceed with cephalosporin use.     Shellfish Allergy Swelling    Patient Measurements: Height: '5\' 10"'$  (177.8 cm) Weight: 110.8 kg (244 lb 4.3 oz) IBW/kg (Calculated) : 73 Heparin Dosing Weight: 94 kg  Vital Signs: Temp: 99.1 F (37.3 C) (12/03 0400) Temp Source: Bladder (12/03 0000) BP: 167/73 (12/03 0400) Pulse Rate: 103 (12/03 0400)  Labs: Recent Labs    04/12/22 1515 04/13/22 0845 04/13/22 1826 04/14/22 0236 04/14/22 0900 04/14/22 1000 04/14/22 1950 04/15/22 0410  HGB 10.0* 8.9*   < > 8.8* 9.4*  --  8.8* 8.4*  HCT 29.4* 26.7*   < > 26.7* 28.7*  --  27.3* 26.1*  PLT  --  144*  --  145*  --   --   --  159  HEPARINUNFRC  --  0.26*   < > 0.22*  --  0.36 0.32 0.35  CREATININE 1.10 0.90  --  1.01  --   --   --   --    < > = values in this interval not displayed.     Estimated Creatinine Clearance: 92.1 mL/min (by C-G formula based on SCr of 1.01 mg/dL).  Assessment: 26 yoM presented on 11/26 for cough, SOB, abdominal pain.  PMH significant for DVT/PE after previous cervical spine surgery 07/17/21 and he completed a course of apixaban in August.  He had a recent L2-L5 fusion on 04/04/22 and had a postop plan to be on Lovenox '40mg'$  (filled 04/04/22 x7 days).  Today, CTa is positive  for acute PE with right heart strain and postop ileus.  Dopplers are positive for DVT in RLE.   Significant Events: Heparin stopped 11/28 for hemoperitoneum, colonic ileus 11/28 to OR: exp lap. TAC, end ileostomy . Intra-op source of bleeding found to be mesentery of hepatic flexure S/p massive transfusion protocol 11/28 & 1 dose of TXA> latest Hg 10.8 & PLT 125 11/29 heparin resumed at1400 with no bolus per CCS & CCM  11/30 Extubated  04/15/2022 Heparin level therapeutic at 0.35  with heparin infusing at 2050 units/hr CBC low but stable, plts now WNL No bleeding or interruptions with infusion documented  Goal of Therapy:  Heparin level 0.3-0.7 units/ml Monitor platelets by anticoagulation protocol: Yes   Plan:  Continue heparin infusion at 2050 units/hr Monitor daily heparin level, CBC, signs/symptoms of bleeding   Thank you for allowing pharmacy to be a part of this patient's care.  Dolly Rias RPh 04/15/2022, 4:53 AM

## 2022-04-15 NOTE — Progress Notes (Signed)
5 Days Post-Op   Subjective/Chief Complaint: Complains of soreness especially in throat   Objective: Vital signs in last 24 hours: Temp:  [98.6 F (37 C)-99.5 F (37.5 C)] 99.1 F (37.3 C) (12/03 0800) Pulse Rate:  [93-107] 99 (12/03 0800) Resp:  [14-25] 17 (12/03 0800) BP: (128-190)/(55-106) 190/78 (12/03 0700) SpO2:  [90 %-99 %] 93 % (12/03 0800) Weight:  [110.8 kg] 110.8 kg (12/03 0434) Last BM Date : 04/13/22  Intake/Output from previous day: 12/02 0701 - 12/03 0700 In: 2708.5 [I.V.:2703.4; IV Piggyback:5.1] Out: 2084 [Urine:1375; Emesis/NG output:700; Stool:9] Intake/Output this shift: No intake/output data recorded.  General appearance: alert and cooperative Resp: clear to auscultation bilaterally Cardio: regular rate and rhythm GI: soft, distended. Good bs today. Ostomy pink  Lab Results:  Recent Labs    04/14/22 0236 04/14/22 0900 04/14/22 1950 04/15/22 0410  WBC 11.7*  --   --  12.8*  HGB 8.8*   < > 8.8* 8.4*  HCT 26.7*   < > 27.3* 26.1*  PLT 145*  --   --  159   < > = values in this interval not displayed.   BMET Recent Labs    04/14/22 0236 04/15/22 0410  NA 139 141  K 3.5 3.1*  CL 104 106  CO2 24 24  GLUCOSE 113* 112*  BUN 9 9  CREATININE 1.01 0.93  CALCIUM 8.1* 8.0*   PT/INR No results for input(s): "LABPROT", "INR" in the last 72 hours. ABG No results for input(s): "PHART", "HCO3" in the last 72 hours.  Invalid input(s): "PCO2", "PO2"  Studies/Results: No results found.  Anti-infectives: Anti-infectives (From admission, onward)    Start     Dose/Rate Route Frequency Ordered Stop   04/10/22 2107  metroNIDAZOLE (FLAGYL) 500 MG/100ML IVPB       Note to Pharmacy: Enis Gash W: cabinet override      04/10/22 2107 04/10/22 2114   04/10/22 2103  ceFAZolin (ANCEF) 2-4 GM/100ML-% IVPB       Note to Pharmacy: Enis Gash W: cabinet override      04/10/22 2103 04/11/22 0914       Assessment/Plan: s/p  Procedure(s): EXPLORATORY LAPAROTOMY, TOTAL COLECTOMY, END ILEOSTOMY (N/A) Bowel function seems to be improving. Once he has ostomy output then we can consider removing ng Replace K VTE - heparin drip ID - no issues Disposition - ICU for monitoring, remains extubated   LOS: 7 days    Autumn Messing III 04/15/2022

## 2022-04-16 DIAGNOSIS — J9601 Acute respiratory failure with hypoxia: Secondary | ICD-10-CM | POA: Diagnosis not present

## 2022-04-16 DIAGNOSIS — N179 Acute kidney failure, unspecified: Secondary | ICD-10-CM | POA: Diagnosis not present

## 2022-04-16 DIAGNOSIS — I82411 Acute embolism and thrombosis of right femoral vein: Secondary | ICD-10-CM | POA: Diagnosis not present

## 2022-04-16 DIAGNOSIS — I2699 Other pulmonary embolism without acute cor pulmonale: Secondary | ICD-10-CM | POA: Diagnosis not present

## 2022-04-16 LAB — CBC
HCT: 28.4 % — ABNORMAL LOW (ref 39.0–52.0)
Hemoglobin: 9.1 g/dL — ABNORMAL LOW (ref 13.0–17.0)
MCH: 29.3 pg (ref 26.0–34.0)
MCHC: 32 g/dL (ref 30.0–36.0)
MCV: 91.3 fL (ref 80.0–100.0)
Platelets: 218 10*3/uL (ref 150–400)
RBC: 3.11 MIL/uL — ABNORMAL LOW (ref 4.22–5.81)
RDW: 16.5 % — ABNORMAL HIGH (ref 11.5–15.5)
WBC: 15.1 10*3/uL — ABNORMAL HIGH (ref 4.0–10.5)
nRBC: 0.5 % — ABNORMAL HIGH (ref 0.0–0.2)

## 2022-04-16 LAB — GLUCOSE, CAPILLARY
Glucose-Capillary: 100 mg/dL — ABNORMAL HIGH (ref 70–99)
Glucose-Capillary: 107 mg/dL — ABNORMAL HIGH (ref 70–99)
Glucose-Capillary: 111 mg/dL — ABNORMAL HIGH (ref 70–99)
Glucose-Capillary: 114 mg/dL — ABNORMAL HIGH (ref 70–99)
Glucose-Capillary: 115 mg/dL — ABNORMAL HIGH (ref 70–99)
Glucose-Capillary: 119 mg/dL — ABNORMAL HIGH (ref 70–99)

## 2022-04-16 LAB — HEMOGLOBIN AND HEMATOCRIT, BLOOD
HCT: 28 % — ABNORMAL LOW (ref 39.0–52.0)
HCT: 29.1 % — ABNORMAL LOW (ref 39.0–52.0)
Hemoglobin: 9 g/dL — ABNORMAL LOW (ref 13.0–17.0)
Hemoglobin: 9.5 g/dL — ABNORMAL LOW (ref 13.0–17.0)

## 2022-04-16 LAB — PROCALCITONIN: Procalcitonin: 0.62 ng/mL

## 2022-04-16 LAB — BASIC METABOLIC PANEL
Anion gap: 10 (ref 5–15)
BUN: 9 mg/dL (ref 8–23)
CO2: 23 mmol/L (ref 22–32)
Calcium: 8.2 mg/dL — ABNORMAL LOW (ref 8.9–10.3)
Chloride: 106 mmol/L (ref 98–111)
Creatinine, Ser: 0.92 mg/dL (ref 0.61–1.24)
GFR, Estimated: >60 mL/min (ref >60)
Glucose, Bld: 113 mg/dL — ABNORMAL HIGH (ref 70–99)
Potassium: 3.4 mmol/L — ABNORMAL LOW (ref 3.5–5.1)
Sodium: 139 mmol/L (ref 135–145)

## 2022-04-16 LAB — PHOSPHORUS: Phosphorus: 3 mg/dL (ref 2.5–4.6)

## 2022-04-16 LAB — SURGICAL PATHOLOGY

## 2022-04-16 LAB — HEPARIN LEVEL (UNFRACTIONATED): Heparin Unfractionated: 0.34 IU/mL (ref 0.30–0.70)

## 2022-04-16 MED ORDER — CYCLOBENZAPRINE HCL 10 MG PO TABS
10.0000 mg | ORAL_TABLET | Freq: Three times a day (TID) | ORAL | Status: AC | PRN
  Administered 2022-04-17 (×2): 10 mg via ORAL
  Filled 2022-04-16 (×2): qty 1

## 2022-04-16 MED ORDER — ADULT MULTIVITAMIN W/MINERALS CH
1.0000 | ORAL_TABLET | Freq: Every day | ORAL | Status: DC
  Administered 2022-04-16 – 2022-04-22 (×7): 1 via ORAL
  Filled 2022-04-16 (×7): qty 1

## 2022-04-16 MED ORDER — LABETALOL HCL 5 MG/ML IV SOLN
10.0000 mg | INTRAVENOUS | Status: DC | PRN
  Administered 2022-04-17: 10 mg via INTRAVENOUS
  Filled 2022-04-16: qty 4

## 2022-04-16 MED ORDER — AMLODIPINE BESYLATE 5 MG PO TABS
5.0000 mg | ORAL_TABLET | Freq: Every day | ORAL | Status: DC
  Administered 2022-04-16 – 2022-04-17 (×2): 5 mg via ORAL
  Filled 2022-04-16 (×2): qty 1

## 2022-04-16 MED ORDER — METOPROLOL TARTRATE 25 MG PO TABS
25.0000 mg | ORAL_TABLET | Freq: Two times a day (BID) | ORAL | Status: DC
  Administered 2022-04-16 – 2022-04-19 (×8): 25 mg via ORAL
  Filled 2022-04-16 (×8): qty 1

## 2022-04-16 MED ORDER — OXYCODONE HCL 5 MG PO TABS
5.0000 mg | ORAL_TABLET | ORAL | Status: DC | PRN
  Administered 2022-04-16: 10 mg via ORAL
  Administered 2022-04-16: 5 mg via ORAL
  Administered 2022-04-17 (×4): 10 mg via ORAL
  Administered 2022-04-18 (×2): 5 mg via ORAL
  Administered 2022-04-19: 10 mg via ORAL
  Administered 2022-04-19: 5 mg via ORAL
  Administered 2022-04-19: 10 mg via ORAL
  Administered 2022-04-19: 5 mg via ORAL
  Filled 2022-04-16: qty 1
  Filled 2022-04-16 (×4): qty 2
  Filled 2022-04-16 (×2): qty 1
  Filled 2022-04-16 (×2): qty 2
  Filled 2022-04-16: qty 1
  Filled 2022-04-16: qty 2

## 2022-04-16 NOTE — Progress Notes (Signed)
Upon receiving report, notified that PIV not working, IV team consult placed. Unsure how long IVF have been on hold.

## 2022-04-16 NOTE — Progress Notes (Signed)
ANTICOAGULATION CONSULT NOTE - Follow Up Consult  Pharmacy Consult for Heparin Indication: pulmonary embolus and DVT  Allergies  Allergen Reactions   Bee Venom     Other reaction(s): Unknown   Lisinopril     Other reaction(s): Unknown   Metoclopramide Other (See Comments)    Other reaction(s): shaking too bad "Sweat like crazy"    Penicillins Hives    Did it involve swelling of the face/tongue/throat, SOB, or low BP? Yes Did it involve sudden or severe rash/hives, skin peeling, or any reaction on the inside of your mouth or nose? Yes Did you need to seek medical attention at a hospital or doctor's office? No When did it last happen?  childhood      If all above answers are "NO", may proceed with cephalosporin use.     Shellfish Allergy Swelling    Patient Measurements: Height: '5\' 10"'$  (177.8 cm) Weight: 110.8 kg (244 lb 4.3 oz) IBW/kg (Calculated) : 73 Heparin Dosing Weight: 94 kg  Vital Signs: Temp: 98.2 F (36.8 C) (12/04 0421) Temp Source: Oral (12/04 0421) BP: 183/84 (12/03 2225) Pulse Rate: 101 (12/03 2000)  Labs: Recent Labs    04/13/22 0845 04/13/22 1826 04/14/22 0236 04/14/22 0900 04/14/22 1950 04/15/22 0410 04/15/22 1522 04/15/22 2150 04/16/22 0534  HGB 8.9*   < > 8.8*   < > 8.8* 8.4* 9.6* 10.1* 9.0*  9.1*  HCT 26.7*   < > 26.7*   < > 27.3* 26.1* 29.8* 32.3* 28.0*  28.4*  PLT 144*  --  145*  --   --  159  --   --  218  HEPARINUNFRC 0.26*   < > 0.22*   < > 0.32 0.35  --   --  0.34  CREATININE 0.90  --  1.01  --   --  0.93  --   --   --    < > = values in this interval not displayed.    Estimated Creatinine Clearance: 100 mL/min (by C-G formula based on SCr of 0.93 mg/dL).   Medications:  Infusions:   sodium chloride 10 mL/hr at 04/15/22 1704   0.9 % NaCl with KCl 20 mEq / L 100 mL/hr at 04/15/22 1704   heparin 2,050 Units/hr (04/15/22 2226)    Assessment: 19 yoM presented on 11/26 for cough, SOB, abdominal pain.  PMH significant for  DVT/PE after previous cervical spine surgery 07/17/21 and he completed a course of apixaban in August.  He had a recent L2-L5 fusion on 04/04/22 and had a postop plan to be on Lovenox '40mg'$  (filled 04/04/22 x7 days).  On admission, CTa is positive for acute PE with right heart strain and postop ileus.  Dopplers are positive for DVT in RLE.   Significant events: 11/28 Heparin stopped for hemoperitoneum, colonic ileus.  OR for exp lap. TAC, end ileostomy.  Intra-op source of bleeding found to be mesentery of hepatic flexure.  Massive transfusion protocol & 1 dose of TXA. 11/29 heparin resumed with no bolus per CCS & CCM    Today, 04/16/2022: Heparin level 0.34, remains therapeutic on heparin 2050 units/hr CBC: Hgb 9 (remains low/stable), Plt 218 (increasing) No bleeding or complications reported.    Goal of Therapy:  Heparin level 0.3-0.7 units/ml Monitor platelets by anticoagulation protocol: Yes   Plan:  Continue heparin IV infusion at 2050 units/hr Monitor daily heparin level and CBC, s/s bleeding.    Gretta Arab PharmD, BCPS [facility] main pharmacy [phone] 04/16/2022 7:10 AM

## 2022-04-16 NOTE — Telephone Encounter (Signed)
Patient is scheduled for HFU with Rexene Edison, NP on 07/18/2022 at 9:30am as provider schedules are not out for March. Reminder mailed to address on file.

## 2022-04-16 NOTE — Progress Notes (Signed)
6 Days Post-Op   Subjective/Chief Complaint: Throat burns.  Not complaining of abdominal pain.  Liquid stool in ostomy   Objective: Vital signs in last 24 hours: Temp:  [98.2 F (36.8 C)-99.9 F (37.7 C)] 98.2 F (36.8 C) (12/04 0421) Pulse Rate:  [80-102] 101 (12/03 2000) Resp:  [13-25] 16 (12/03 2000) BP: (153-185)/(73-94) 183/84 (12/03 2225) SpO2:  [93 %-98 %] 97 % (12/03 2000) Last BM Date : 04/13/22  Intake/Output from previous day: 12/03 0701 - 12/04 0700 In: 1381.3 [I.V.:1089.8; IV Piggyback:291.5] Out: 925 [Urine:925] Intake/Output this shift: No intake/output data recorded.  General appearance: alert and cooperative Resp: clear to auscultation bilaterally Cardio: regular rate and rhythm GI: soft, distended. Good bs today. Ostomy pink, gas and liquid stool in bag  Lab Results:  Recent Labs    04/15/22 0410 04/15/22 1522 04/15/22 2150 04/16/22 0534  WBC 12.8*  --   --  15.1*  HGB 8.4*   < > 10.1* 9.0*  9.1*  HCT 26.1*   < > 32.3* 28.0*  28.4*  PLT 159  --   --  218   < > = values in this interval not displayed.    BMET Recent Labs    04/14/22 0236 04/15/22 0410  NA 139 141  K 3.5 3.1*  CL 104 106  CO2 24 24  GLUCOSE 113* 112*  BUN 9 9  CREATININE 1.01 0.93  CALCIUM 8.1* 8.0*    PT/INR No results for input(s): "LABPROT", "INR" in the last 72 hours. ABG No results for input(s): "PHART", "HCO3" in the last 72 hours.  Invalid input(s): "PCO2", "PO2"  Studies/Results: No results found.  Anti-infectives: Anti-infectives (From admission, onward)    Start     Dose/Rate Route Frequency Ordered Stop   04/10/22 2107  metroNIDAZOLE (FLAGYL) 500 MG/100ML IVPB       Note to Pharmacy: Enis Gash W: cabinet override      04/10/22 2107 04/10/22 2114   04/10/22 2103  ceFAZolin (ANCEF) 2-4 GM/100ML-% IVPB       Note to Pharmacy: Enis Gash W: cabinet override      04/10/22 2103 04/11/22 0914       Assessment/Plan: s/p  Procedure(s): EXPLORATORY LAPAROTOMY, TOTAL COLECTOMY, END ILEOSTOMY (N/A) Clamp NG, remove at noon if doing well Clear liquid diet advance as tolerated Monitor ostomy output, may need to start antidiarrheals prior to discharge to avoid readmission for aki  VTE - heparin drip ID - no issues Disposition - Per medicine, okay to transfer out of ICU from surgery perspective  LOS: 8 days    Kyle Delacruz Kyle Delacruz 04/16/2022

## 2022-04-16 NOTE — Progress Notes (Signed)
[facility]                                                                              Kyle Delacruz, is a 66 y.o. male, DOB - 1955-08-29, GYF:[id number] Admit date - 04/08/2022    Outpatient Primary MD for the patient is Kyle, [name], MD  LOS - 7  days  Chief Complaint  Patient presents with   Constipation       Brief summary   Patient is a 67 year old male with provoked DVT who presented after lumbar surgery with worsening shortness of breath and RLE swelling. 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  Echo/CTA showing some RV strain. Hospital course complicated by severe constipation vs. colonic ileus partially relieved with enema 04/10/22.  He developed searing epigastric pain with radiation to shoulder, and worsened with movement after enema. He subsequently developed worsening hypoxia, tachycardia and hypotension requiring intubation on 11/28.  CT results showed a large pericolonic hematoma tracking into the peritoneum and right hemiliver.  Mass transfusion protocol was activated and he was evaluated by surgery and underwent emergency ex lap with total colectomy and end ileostomy on 04/10/2022.  Started on IV heparin on 11/29 He was extubated on 11/30 and weaned off of vasopressors on 12/1  [facility] assumed care on 04/15/2022   Assessment & Plan    Principal Problem: Acute submassive PE (pulmonary thromboembolism) (HCC)/ RLE DVT, hemorrhagic shock -CTA chest showed acute PE in the upper lobe branch of right pulmonary artery, CT evidence of right heart strain consistent with submassive PE. -Weaned off of vasopressors  -Continue IV heparin drip. - d/w CCM, Dr Erin Fulling, recommended eliquis/DOACs until he is surgically cleared, return of bowel function and no further need for any surgical procedures.  Once medically cleared, transition to DOACs for PE/DVT.  Likely will need prophylactic dosing long term after 3-6 months of full dose treatment.     Active Problems:   Acute  respiratory failure with hypoxia ([place]) - Patient was extubated on 04/12/2022 -Stable, sats 99% on room air  Post colonic ileus, acute blood loss anemia, intra-abdominal hematoma s/ ex lap, hemorrhagic shock -Underwent exploratory laparotomy, total colectomy, end ileostomy on 11/28 -Pain better controlled today -Surgery following closely, planning on removing NGT today and starting clears    Hypothyroidism -Continue IV Synthroid 25 mcg daily until taking consistently p.o. then will transition back to oral Synthroid  -outpatient dose Synthroid 50 mcg p.o. daily  Accelerated hypertension -Likely now worse due to pain, anxiety -Starting clears today, will place on oral metoprolol, Norvasc -DC IV Lopressor and continue IV hydralazine as needed with parameters     Gastro-esophageal reflux disease without esophagitis -Continue IV PPI  Hyperglycemia -Continue sliding scale insulin while inpatient.  Not on any oral hypoglycemics outpatient -Hemoglobin A1c 5.9 on 11/30  Hypokalemia -Replaced   Obesity Estimated body mass index is 35.05 kg/m as calculated from the following:   Height as of this encounter: '5\' 10"'$  (1.778 m).   Weight as of this encounter: 110.8 kg.  Code Status: Full CODE STATUS DVT Prophylaxis:  Place TED hose Start: 04/09/22 0021   Level of Care: Level of care: ICU  Family Communication: Updated patient's wife at the bedside on 12/3  Disposition Plan:      Remains inpatient appropriate:   Procedures:  11/28 EXPLORATORY LAPAROTOMY, TOTAL COLECTOMY, END ILEOSTOMY (N/A)  Intubation 11/28, extubated on 11/30  Consultants:   General surgery CCM  Antimicrobials:   Anti-infectives (From admission, onward)    Start     Dose/Rate Route Frequency Ordered Stop   04/10/22 2107  metroNIDAZOLE (FLAGYL) 500 MG/100ML IVPB       Note to Pharmacy: Enis Gash W: cabinet override      04/10/22 2107 04/10/22 2114   04/10/22 2103  ceFAZolin (ANCEF) 2-4 GM/100ML-% IVPB        Note to Pharmacy: Enis Gash W: cabinet override      04/10/22 2103 04/11/22 0914          Medications  acetaminophen  650 mg Oral Q6H   Chlorhexidine Gluconate Cloth  6 each Topical Daily   insulin aspart  0-9 Units Subcutaneous Q4H   pantoprazole (PROTONIX) IV  40 mg Intravenous Daily      Subjective:   Kyle Delacruz was seen and examined today.  BP uncontrolled, states abdominal pain is better controlled today, no acute nausea or vomiting.  No headache, chest pain, blurry vision, shortness of breath  Objective:   Vitals:   04/15/22 0600 04/15/22 0700 04/15/22 0800 04/15/22 0900  BP: (!) 160/71 (!) 190/78 (!) 183/73 (!) 153/74  Pulse: 100 (!) 102 99 99  Resp: 16 (!) '25 17 13  '$ Temp: 99.1 F (37.3 C) 99.1 F (37.3 C) 99.1 F (37.3 C) 99.1 F (37.3 C)  TempSrc:   Bladder   SpO2: 94% 90% 93% 93%  Weight:      Height:        Intake/Output Summary (Last 24 hours) at 04/15/2022 1114 Last data filed at 04/15/2022 0851 Gross per 24 hour  Intake 2415.79 ml  Output 2084 ml  Net 331.79 ml     Wt Readings from Last 3 Encounters:  04/15/22 110.8 kg  02/14/22 106.6 kg  02/05/22 107 kg   Physical Exam General: Alert and oriented x 3, NAD Cardiovascular: S1 S2 clear, RRR.  Tachycardia Respiratory: CTAB, no wheezing Gastrointestinal: Soft, dressing intact, appropriately tender, liquid stools in ostomy bag Ext: no pedal edema bilaterally Neuro: no new deficits Psych: Normal affect     Data Reviewed:  I have personally reviewed following labs    CBC Lab Results  Component Value Date   WBC 12.8 (H) 04/15/2022   RBC 2.84 (L) 04/15/2022   HGB 8.4 (L) 04/15/2022   HCT 26.1 (L) 04/15/2022   MCV 91.9 04/15/2022   MCH 29.6 04/15/2022   PLT 159 04/15/2022   MCHC 32.2 04/15/2022   RDW 16.2 (H) 04/15/2022   LYMPHSABS 1.7 04/08/2022   MONOABS 1.3 (H) 04/08/2022   EOSABS 0.1 04/08/2022   BASOSABS 0.0 11/07/9483     Last metabolic panel Lab Results   Component Value Date   NA 141 04/15/2022   K 3.1 (L) 04/15/2022   CL 106 04/15/2022   CO2 24 04/15/2022   BUN 9 04/15/2022   CREATININE 0.93 04/15/2022   GLUCOSE 112 (H) 04/15/2022   GFRNONAA >60 04/15/2022   GFRAA >60 08/19/2019   CALCIUM 8.0 (L) 04/15/2022   PHOS 3.0 04/15/2022   PROT 6.3 (L) 07/27/2021   ALBUMIN 2.9 (L) 07/27/2021   BILITOT 1.3 (H) 07/27/2021   ALKPHOS 50 07/27/2021   AST 14 (L) 07/27/2021   ALT 13  07/27/2021   ANIONGAP 11 04/15/2022    CBG (last 3)  Recent Labs    04/14/22 2322 04/15/22 0344 04/15/22 0757  GLUCAP 99 115* 110*      Coagulation Profile: No results for input(s): "INR", "PROTIME" in the last 168 hours.   Radiology Studies: I have personally reviewed the imaging studies  No results found.     Estill Cotta M.D. [facility] 04/15/2022, 11:14 AM  Available via Epic secure chat 7am-7pm After 7 pm, please refer to night coverage provider listed on amion.

## 2022-04-16 NOTE — Progress Notes (Signed)
Nutrition Follow-up  DOCUMENTATION CODES:   Not applicable  INTERVENTION:  - Clear Liquid diet per MD.  - Recommend Ensure Max po BID once diet advanced past clear liquids to support intake. - Encourage intake of small and frequent meals to stimulate appetite and increase intake. - Will order daily multivitamin to support micronutrient needs. - Monitor weights.    NUTRITION DIAGNOSIS:   Inadequate oral intake related to acute illness (postop ileus s/p total colectomy and end ileostomy) as evidenced by other (comment) (clear liquid diet).  GOAL:   Patient will meet greater than or equal to 90% of their needs *not yet being met  MONITOR:   PO intake, Diet advancement, Weight trends, Supplement acceptance  REASON FOR ASSESSMENT:   Ventilator    ASSESSMENT:   66 year old man with hx of provoked DVT, DMII, HLD, hypothyroidism who presents after lumbar surgery with worsening SOB and RLE swelling. Found to have recurrent VTE and postop ileus.  11/26 Admit 11/28 intubated, s/p ex lap, total colectomy with end ileostomy 11/30 Extubated 12/1 Weaned off pressors 12/4 NG tube removed; clear liquid diet  Patient sitting up in bedside chair at time of visit today. Wife present in room. Patient reports a UBW of 230# and did not feel he had lost weight recently although he notes a lot of friends have told him he looks like he has lost weight.  Patient eating normally (1 meal a day typical) until November 22nd when he endorses he lost his appetite and ate very little due to feeling poorly. Reports his current appetite is still decreased but that he is going to try to slowly increase his intake. Patient agreeable to try nutrition supplements to support intake until appetite improves. Diet advanced to clear liquids today per surgery. Plan for NG clamp trial - later in afternoon NG removed.  Medications reviewed and include: Insulin, Synthroid  Labs reviewed:  K+ 3.4 HA1C 5.9 Blood  Glucose 92-115 x24 hours   Diet Order:   Diet Order             Diet clear liquid Room service appropriate? Yes; Fluid consistency: Thin  Diet effective now                   EDUCATION NEEDS:  No education needs have been identified at this time  Skin:  Skin Assessment: Reviewed RN Assessment  Last BM:  12/1 - ileostomy  Height:  Ht Readings from Last 1 Encounters:  04/10/22 _0  (1.778 m)   Weight:  Wt Readings from Last 1 Encounters:  04/15/22 110.8 kg   Ideal Body Weight:  75.45 kg  BMI:  Body mass index is 35.05 kg/m.  Estimated Nutritional Needs:  Kcal:  2100-2350 kcals Protein:  135-165 grams Fluid:  >/= 2.1L    Samson Frederic RD, LDN For contact information, refer to [facility].

## 2022-04-16 NOTE — Progress Notes (Signed)
Physical Therapy Treatment Patient Details Name: Kyle Delacruz MRN: [id number] DOB: 09-13-1955 Today's Date: 04/16/2022   History of Present Illness Pt is a 66yo male presenting to [facility] ED on 11/26 with worsening SOB and RLE swelling with revealed PE, also constipation. Pt is s/p L2-L5 decompression & fusion 11/22. Underwent emergency exploratory laparatomy with total colectomy and end ileostomy on 11/28, also intubated 11/28. Extubated 11/30.  PMH: DM, hx of DVT 07/2021, GERD, gout, HLD, hypothyroidism, OSA, OSA non-compliant with CPAP, b/l TKA R-1997 L-2001, b/l THA R-2006 L-2009.    PT Comments    Pt OOB in recliner upon entry with NG tube recently removed, pt motivated to participate with therapy. Vitals prior to initiation of mobility: HR103, SpO2 97% on RA, RR20, BP 179/95 (RN notified of BP). Pt able to verbalize back precuations and reviewed abdominal splinting and log roll technique with pt, pt verbalized understanding and demonstrated learning by utilizing splinting technique while coughing at end of session. Pt required min assist +2 for transfers and min guard +2 for ambulation in hallway with RW; HR max during ambulation 141, RR28, SpO2 >90% on RA. Encouraged pt to complete ankle pumps hourly and to ambulate at least one additional time today with hospital staff, pt verbalized understanding and RN aware. Vitals at exit: HR115, RR24, SpO296%, BP 177/84. We will continue to follow acutely.      Recommendations for follow up therapy are one component of a multi-disciplinary discharge planning process, led by the attending physician.  Recommendations may be updated based on patient status, additional functional criteria and insurance authorization.  Follow Up Recommendations  Home health PT     Assistance Recommended at Discharge Frequent or constant Supervision/Assistance  Patient can return home with the following A little help with walking and/or transfers;A little help with  bathing/dressing/bathroom;Assistance with cooking/housework;Assist for transportation;Help with stairs or ramp for entrance   Equipment Recommendations  None recommended by PT (Pt has recommended DME)    Recommendations for Other Services       Precautions / Restrictions Precautions Precautions: Fall;Back;Other (comment) (NG tube, colostomy, abdominal incision) Precaution Booklet Issued: No Precaution Comments: Reviewed verbally Restrictions Weight Bearing Restrictions: No Other Position/Activity Restrictions: wbat. Unable to wear spinal brace due to new abdominal incision and colostomy     Mobility  Bed Mobility               General bed mobility comments: Pt OOB in reclienr at entry and exit    Transfers Overall transfer level: Needs assistance Equipment used: Rolling walker (2 wheels) Transfers: Sit to/from Stand, Bed to chair/wheelchair/BSC Sit to Stand: +2 safety/equipment, Min assist           General transfer comment: Sit to stand: pt min assist for steadying, +2 for management of lines only, no additional physical assist required    Ambulation/Gait Ambulation/Gait assistance: Min guard, +2 safety/equipment Gait Distance (Feet): 38 Feet Assistive device: Rolling walker (2 wheels) Gait Pattern/deviations: Step-through pattern, Trunk flexed Gait velocity: decreased     General Gait Details: Pt ambulated with RW and min guard +2 for recliner follow, no physical assist required or overt LOB noted. Pt with trunk flexed and moderate use of BUE on RW; required two standing rest breaks, distances were as follows 9+9+18. HR high 140 during ambulation, SpO2 >90%throughout.   Stairs             Wheelchair Mobility    Modified Rankin (Stroke Patients Only)  Balance Overall balance assessment: Mild deficits observed, not formally tested Sitting-balance support: Feet supported, No upper extremity supported Sitting balance-Leahy Scale: Good      Standing balance support: Reliant on assistive device for balance, During functional activity, Bilateral upper extremity supported Standing balance-Leahy Scale: Poor                              Cognition Arousal/Alertness: Awake/alert Behavior During Therapy: WFL for tasks assessed/performed Overall Cognitive Status: Within Functional Limits for tasks assessed                                          Exercises General Exercises - Lower Extremity Ankle Circles/Pumps: AROM, Both, 5 reps    General Comments        Pertinent Vitals/Pain Pain Assessment Pain Assessment: 0-10 Pain Score: 5  Pain Location: abdomen, low back Pain Descriptors / Indicators: Operative site guarding, Sore Pain Intervention(s): Limited activity within patient's tolerance, Monitored during session, Repositioned    Home Living                          Prior Function            PT Goals (current goals can now be found in the care plan section) Acute Rehab PT Goals Patient Stated Goal: Go home PT Goal Formulation: With patient/family Time For Goal Achievement: 04/27/22 Potential to Achieve Goals: Good Progress towards PT goals: Progressing toward goals    Frequency    Min 3X/week      PT Plan Current plan remains appropriate    Co-evaluation              AM-PAC PT "6 Clicks" Mobility   Outcome Measure  Help needed turning from your back to your side while in a flat bed without using bedrails?: A Little Help needed moving from lying on your back to sitting on the side of a flat bed without using bedrails?: A Little Help needed moving to and from a bed to a chair (including a wheelchair)?: A Little Help needed standing up from a chair using your arms (e.g., wheelchair or bedside chair)?: A Little Help needed to walk in hospital room?: A Little Help needed climbing 3-5 steps with a railing? : A Lot 6 Click Score: 17    End of Session  Equipment Utilized During Treatment: Gait belt Activity Tolerance: Patient tolerated treatment well;No increased pain Patient left: in chair;with call bell/phone within reach;with family/visitor present Nurse Communication: Mobility status PT Visit Diagnosis: Pain;Difficulty in walking, not elsewhere classified (R26.2) Pain - part of body:  (abdomen, back)     Time: 5176-1607 PT Time Calculation (min) (ACUTE ONLY): 28 min  Charges:  $Gait Training: 23-37 mins                     Coolidge Breeze, PT, DPT [facility] Office: [phone] Weekend pager: [phone]   Coolidge Breeze 04/16/2022, 2:47 PM

## 2022-04-17 ENCOUNTER — Inpatient Hospital Stay (HOSPITAL_COMMUNITY): Payer: Medicare Other

## 2022-04-17 DIAGNOSIS — J9601 Acute respiratory failure with hypoxia: Secondary | ICD-10-CM | POA: Diagnosis not present

## 2022-04-17 DIAGNOSIS — I2699 Other pulmonary embolism without acute cor pulmonale: Secondary | ICD-10-CM | POA: Diagnosis not present

## 2022-04-17 DIAGNOSIS — I82411 Acute embolism and thrombosis of right femoral vein: Secondary | ICD-10-CM | POA: Diagnosis not present

## 2022-04-17 DIAGNOSIS — N179 Acute kidney failure, unspecified: Secondary | ICD-10-CM | POA: Diagnosis not present

## 2022-04-17 LAB — GLUCOSE, CAPILLARY
Glucose-Capillary: 103 mg/dL — ABNORMAL HIGH (ref 70–99)
Glucose-Capillary: 108 mg/dL — ABNORMAL HIGH (ref 70–99)
Glucose-Capillary: 116 mg/dL — ABNORMAL HIGH (ref 70–99)
Glucose-Capillary: 117 mg/dL — ABNORMAL HIGH (ref 70–99)
Glucose-Capillary: 121 mg/dL — ABNORMAL HIGH (ref 70–99)

## 2022-04-17 LAB — HEPARIN LEVEL (UNFRACTIONATED)
Heparin Unfractionated: 0.29 IU/mL — ABNORMAL LOW (ref 0.30–0.70)
Heparin Unfractionated: 0.43 IU/mL (ref 0.30–0.70)
Heparin Unfractionated: 0.53 IU/mL (ref 0.30–0.70)

## 2022-04-17 LAB — URINALYSIS, ROUTINE W REFLEX MICROSCOPIC
Bilirubin Urine: NEGATIVE
Glucose, UA: NEGATIVE mg/dL
Ketones, ur: 20 mg/dL — AB
Nitrite: NEGATIVE
Protein, ur: 30 mg/dL — AB
Specific Gravity, Urine: 1.019 (ref 1.005–1.030)
pH: 5 (ref 5.0–8.0)

## 2022-04-17 LAB — CBC
HCT: 27.7 % — ABNORMAL LOW (ref 39.0–52.0)
Hemoglobin: 8.9 g/dL — ABNORMAL LOW (ref 13.0–17.0)
MCH: 29.2 pg (ref 26.0–34.0)
MCHC: 32.1 g/dL (ref 30.0–36.0)
MCV: 90.8 fL (ref 80.0–100.0)
Platelets: 239 10*3/uL (ref 150–400)
RBC: 3.05 MIL/uL — ABNORMAL LOW (ref 4.22–5.81)
RDW: 16.5 % — ABNORMAL HIGH (ref 11.5–15.5)
WBC: 22.7 10*3/uL — ABNORMAL HIGH (ref 4.0–10.5)
nRBC: 0.4 % — ABNORMAL HIGH (ref 0.0–0.2)

## 2022-04-17 LAB — BASIC METABOLIC PANEL
Anion gap: 10 (ref 5–15)
BUN: 8 mg/dL (ref 8–23)
CO2: 24 mmol/L (ref 22–32)
Calcium: 8 mg/dL — ABNORMAL LOW (ref 8.9–10.3)
Chloride: 106 mmol/L (ref 98–111)
Creatinine, Ser: 0.89 mg/dL (ref 0.61–1.24)
GFR, Estimated: >60 mL/min (ref >60)
Glucose, Bld: 104 mg/dL — ABNORMAL HIGH (ref 70–99)
Potassium: 3.4 mmol/L — ABNORMAL LOW (ref 3.5–5.1)
Sodium: 140 mmol/L (ref 135–145)

## 2022-04-17 LAB — PROCALCITONIN: Procalcitonin: 1.3 ng/mL

## 2022-04-17 MED ORDER — AMLODIPINE BESYLATE 10 MG PO TABS
10.0000 mg | ORAL_TABLET | Freq: Every day | ORAL | Status: DC
  Administered 2022-04-18 – 2022-04-22 (×5): 10 mg via ORAL
  Filled 2022-04-17 (×5): qty 1

## 2022-04-17 MED ORDER — POTASSIUM CHLORIDE CRYS ER 20 MEQ PO TBCR
20.0000 meq | EXTENDED_RELEASE_TABLET | Freq: Once | ORAL | Status: AC
  Administered 2022-04-17: 20 meq via ORAL
  Filled 2022-04-17: qty 1

## 2022-04-17 MED ORDER — ENSURE MAX PROTEIN PO LIQD
11.0000 [oz_av] | Freq: Two times a day (BID) | ORAL | Status: DC
  Administered 2022-04-17 – 2022-04-22 (×8): 11 [oz_av] via ORAL
  Filled 2022-04-17 (×11): qty 330

## 2022-04-17 MED ORDER — POTASSIUM CHLORIDE CRYS ER 20 MEQ PO TBCR
40.0000 meq | EXTENDED_RELEASE_TABLET | Freq: Once | ORAL | Status: AC
  Administered 2022-04-17: 40 meq via ORAL
  Filled 2022-04-17: qty 2

## 2022-04-17 MED ORDER — AMLODIPINE BESYLATE 5 MG PO TABS
5.0000 mg | ORAL_TABLET | Freq: Once | ORAL | Status: AC
  Administered 2022-04-17: 5 mg via ORAL
  Filled 2022-04-17: qty 1

## 2022-04-17 NOTE — Progress Notes (Signed)
ANTICOAGULATION CONSULT NOTE - Follow Up Consult  Pharmacy Consult for Heparin Indication: pulmonary embolus and DVT  Allergies  Allergen Reactions   Bee Venom     Other reaction(s): Unknown   Lisinopril     Other reaction(s): Unknown   Metoclopramide Other (See Comments)    Other reaction(s): shaking too bad "Sweat like crazy"    Penicillins Hives    Did it involve swelling of the face/tongue/throat, SOB, or low BP? Yes Did it involve sudden or severe rash/hives, skin peeling, or any reaction on the inside of your mouth or nose? Yes Did you need to seek medical attention at a hospital or doctor's office? No When did it last happen?  childhood      If all above answers are "NO", may proceed with cephalosporin use.     Shellfish Allergy Swelling    Patient Measurements: Height: '5\' 10"'$  (177.8 cm) Weight: 108.5 kg (239 lb 3.2 oz) IBW/kg (Calculated) : 73 Heparin Dosing Weight: 94 kg  Vital Signs: Temp: 99.6 F (37.6 C) (12/05 1345) Temp Source: Oral (12/05 1345) BP: 156/80 (12/05 1300) Pulse Rate: 110 (12/05 1300)  Labs: Recent Labs    04/15/22 0410 04/15/22 1522 04/16/22 0534 04/16/22 1007 04/17/22 0246 04/17/22 1247  HGB 8.4*   < > 9.0*  9.1* 9.5* 8.9*  --   HCT 26.1*   < > 28.0*  28.4* 29.1* 27.7*  --   PLT 159  --  218  --  239  --   HEPARINUNFRC 0.35  --  0.34  --  0.29* 0.43  CREATININE 0.93  --  0.92  --  0.89  --    < > = values in this interval not displayed.     Estimated Creatinine Clearance: 103.4 mL/min (by C-G formula based on SCr of 0.89 mg/dL).   Medications:  Infusions:   sodium chloride Stopped (04/15/22 1829)   0.9 % NaCl with KCl 20 mEq / L 50 mL/hr at 04/17/22 1300   heparin 2,150 Units/hr (04/17/22 1300)    Assessment: 43 yoM presented on 11/26 for cough, SOB, abdominal pain.  PMH significant for DVT/PE after previous cervical spine surgery 07/17/21 and he completed a course of apixaban in August.  He had a recent L2-L5 fusion on  04/04/22 and had a postop plan to be on Lovenox '40mg'$  (filled 04/04/22 x7 days).  On admission, CTa is positive for acute PE with right heart strain and postop ileus.  Dopplers are positive for DVT in RLE.   Significant events: 11/28 Heparin stopped for hemoperitoneum, colonic ileus.  OR for exp lap. TAC, end ileostomy.  Intra-op source of bleeding found to be mesentery of hepatic flexure.  Massive transfusion protocol & 1 dose of TXA. 11/29 heparin resumed with no bolus per CCS & CCM    Today, 04/17/2022: Heparin level 0.43, therapeutic on heparin gtt @  2150 units/hr CBC: Hgb 8.9 (remains low/stable), Plt WNL No bleeding or complications noted   Goal of Therapy:  Heparin level 0.3-0.7 units/ml Monitor platelets by anticoagulation protocol: Yes   Plan:  Continue heparin IV infusion at 2150 units/hr Check confirmatory heparin level in 6 hrs Monitor daily heparin level and CBC, s/s bleeding.    Gretta Arab PharmD, BCPS [facility] main pharmacy [phone] 04/17/2022 2:28 PM

## 2022-04-17 NOTE — Progress Notes (Signed)
ANTICOAGULATION CONSULT NOTE - Follow Up Consult  Pharmacy Consult for Heparin Indication: pulmonary embolus and DVT  Allergies  Allergen Reactions   Bee Venom     Other reaction(s): Unknown   Lisinopril     Other reaction(s): Unknown   Metoclopramide Other (See Comments)    Other reaction(s): shaking too bad "Sweat like crazy"    Penicillins Hives    Did it involve swelling of the face/tongue/throat, SOB, or low BP? Yes Did it involve sudden or severe rash/hives, skin peeling, or any reaction on the inside of your mouth or nose? Yes Did you need to seek medical attention at a hospital or doctor's office? No When did it last happen?  childhood      If all above answers are "NO", may proceed with cephalosporin use.     Shellfish Allergy Swelling    Patient Measurements: Height: '5\' 10"'$  (177.8 cm) Weight: 108.5 kg (239 lb 3.2 oz) IBW/kg (Calculated) : 73 Heparin Dosing Weight: 94 kg  Vital Signs: Temp: 98 F (36.7 C) (12/05 1857) Temp Source: Oral (12/05 1857) BP: 119/64 (12/05 1700) Pulse Rate: 108 (12/05 1700)  Labs: Recent Labs    04/15/22 0410 04/15/22 1522 04/16/22 0534 04/16/22 1007 04/17/22 0246 04/17/22 1247 04/17/22 1848  HGB 8.4*   < > 9.0*  9.1* 9.5* 8.9*  --   --   HCT 26.1*   < > 28.0*  28.4* 29.1* 27.7*  --   --   PLT 159  --  218  --  239  --   --   HEPARINUNFRC 0.35  --  0.34  --  0.29* 0.43 0.53  CREATININE 0.93  --  0.92  --  0.89  --   --    < > = values in this interval not displayed.     Estimated Creatinine Clearance: 103.4 mL/min (by C-G formula based on SCr of 0.89 mg/dL).   Assessment: 72 yoM presented on 11/26 for cough, SOB, abdominal pain.  PMH significant for DVT/PE after previous cervical spine surgery 07/17/21 and he completed a course of apixaban in August.  He had a recent L2-L5 fusion on 04/04/22 and had a postop plan to be on Lovenox '40mg'$  (filled 04/04/22 x7 days).  On admission, CTa is positive for acute PE with right  heart strain and postop ileus.  Dopplers are positive for DVT in RLE.   Significant events: 11/28 Heparin stopped for hemoperitoneum, colonic ileus.  OR for exp lap. TAC, end ileostomy.  Intra-op source of bleeding found to be mesentery of hepatic flexure.  Massive transfusion protocol & 1 dose of TXA. 11/29 heparin resumed with no bolus per CCS & CCM    Today, 04/17/2022: 1848 confirmatory Heparin level 0.53, therapeutic on heparin gtt @  2150 units/hr CBC: Hgb 8.9 (remains low/stable), Plt WNL No bleeding or complications noted   Goal of Therapy:  Heparin level 0.3-0.7 units/ml Monitor platelets by anticoagulation protocol: Yes   Plan:  Continue heparin IV infusion at 2150 units/hr Monitor daily heparin level and CBC, s/s bleeding.   Eudelia Bunch, Pharm.D Use secure chat for questions 04/17/2022 7:33 PM

## 2022-04-17 NOTE — Consult Note (Addendum)
[place] Nurse ostomy follow up Stoma type/location: RUQ end ileostomy Stomal assessment/size: 1 and 1/2 inch round, raised, edematous, red Peristomal assessment: intact, clear Treatment options for stomal/peristomal skin: skin barrier ring Output: brown/green effluent Ostomy pouching: 2pc. 2 and 1/4 inch pouching system with skin barrier ring Education provided: Patient's wife is provided with an ileostomy dietary guidelines handout today. She is attentive and engaged for teaching and observes pouch change, including measurement of stoma and rationale provided. Enrolled patient in [place] [facility] Discharge program: Yes  Next planned visit it on FRIDAY, 04/21/23 for routine pouch change and possible patient education. Patient remains in ICU and there are limited teaching opportunities at this time.  [place] nursing team will follow, and will remain available to this patient, the nursing and medical teams.   Thank you for inviting [place] to participate in this patient's Plan of Care.  Maudie Flakes, MSN, RN, CNS, [place], Serita Grammes, [employer], [employer] phone:  [phone]

## 2022-04-17 NOTE — Progress Notes (Signed)
ANTICOAGULATION CONSULT NOTE - Follow Up Consult  Pharmacy Consult for Heparin Indication: pulmonary embolus and DVT  Allergies  Allergen Reactions   Bee Venom     Other reaction(s): Unknown   Lisinopril     Other reaction(s): Unknown   Metoclopramide Other (See Comments)    Other reaction(s): shaking too bad "Sweat like crazy"    Penicillins Hives    Did it involve swelling of the face/tongue/throat, SOB, or low BP? Yes Did it involve sudden or severe rash/hives, skin peeling, or any reaction on the inside of your mouth or nose? Yes Did you need to seek medical attention at a hospital or doctor's office? No When did it last happen?  childhood      If all above answers are "NO", may proceed with cephalosporin use.     Shellfish Allergy Swelling    Patient Measurements: Height: '5\' 10"'$  (177.8 cm) Weight: 108.5 kg (239 lb 3.2 oz) IBW/kg (Calculated) : 73 Heparin Dosing Weight: 94 kg  Vital Signs: Temp: 97.8 F (36.6 C) (12/05 0330) Temp Source: Oral (12/05 0330) BP: 149/75 (12/05 0600) Pulse Rate: 117 (12/05 0600)  Labs: Recent Labs    04/15/22 0410 04/15/22 1522 04/16/22 0534 04/16/22 1007 04/17/22 0246  HGB 8.4*   < > 9.0*  9.1* 9.5* 8.9*  HCT 26.1*   < > 28.0*  28.4* 29.1* 27.7*  PLT 159  --  218  --  239  HEPARINUNFRC 0.35  --  0.34  --  0.29*  CREATININE 0.93  --  0.92  --  0.89   < > = values in this interval not displayed.     Estimated Creatinine Clearance: 103.4 mL/min (by C-G formula based on SCr of 0.89 mg/dL).   Medications:  Infusions:   sodium chloride Stopped (04/15/22 1829)   0.9 % NaCl with KCl 20 mEq / L 100 mL/hr at 04/17/22 0600   heparin 2,050 Units/hr (04/17/22 0600)    Assessment: 72 yoM presented on 11/26 for cough, SOB, abdominal pain.  PMH significant for DVT/PE after previous cervical spine surgery 07/17/21 and he completed a course of apixaban in August.  He had a recent L2-L5 fusion on 04/04/22 and had a postop plan to be on  Lovenox '40mg'$  (filled 04/04/22 x7 days).  On admission, CTa is positive for acute PE with right heart strain and postop ileus.  Dopplers are positive for DVT in RLE.   Significant events: 11/28 Heparin stopped for hemoperitoneum, colonic ileus.  OR for exp lap. TAC, end ileostomy.  Intra-op source of bleeding found to be mesentery of hepatic flexure.  Massive transfusion protocol & 1 dose of TXA. 11/29 heparin resumed with no bolus per CCS & CCM    Today, 04/17/2022: Heparin level 0.29, (subtherapeutic today) with heparin gtt @  2050 units/hr CBC: Hgb 8.9 (remains low/stable), Plt 239 (increasing) No bleeding or complications noted   Goal of Therapy:  Heparin level 0.3-0.7 units/ml Monitor platelets by anticoagulation protocol: Yes   Plan:  Increase heparin IV infusion to 2150 units/hr Check heparin level 6 hr after rate increase Monitor daily heparin level and CBC, s/s bleeding.    Leone Haven, PharmD 04/17/2022 6:28 AM

## 2022-04-17 NOTE — Plan of Care (Signed)

## 2022-04-17 NOTE — Consult Note (Signed)
[place] [facility]  Total days of antibiotics 1         Reason for Consult: leukocytosis and fever   Referring Physician: Tana Coast  Principal Problem:   PE (pulmonary thromboembolism) ([place]) Active Problems:   Hypothyroidism   Essential hypertension   Gastro-esophageal reflux disease without esophagitis   Obstructive sleep apnea (adult) (pediatric)   Acute respiratory failure with hypoxia (HCC)   Leg DVT (deep venous thromboembolism), chronic, right (HCC)   Abdominal distension   Deep vein thrombosis (DVT) of femoral vein of right lower extremity (HCC)   Normocytic anemia   Shock ([place])    HPI: Kyle Delacruz is a 66 y.o. male with hx of PE following cervical fusion in March 2023, finished 6 months of eliquis in October, he subsequently had lumbar fusion at [facility] in november and discharged on  thanksgiving on daily aspirin (but wait 4 days before resuming lovenox). While resting at home, home PT found him to have swollen RLE.In addition, he had roughly a weeks of worsening constipation and abdominal distension which brought him to the ED on 11/26. He was found to be tachycardic, hypoxic. Imaging of CTA chest found acute PE, as well as rLE U/S + DVT. His severe constipation was relieved with enema on 11/28 but had hypotension and worsening hypoxia, worsening anemia in addition to increased abdominal distension. He required intubation and vasopressors. Imaging of abd found that he had hemoperitoneum. On 11/28 he underwent emergent ex lap with total colectomy and end ileostomy to control bleeding. Op note found massively dilated colon with bleeding from the right colon mesentery near hte hepatic flexure- where there was a large hematoma. Since then, patient has had stable hgb. Extubated. He started to have rising WBC up to 22.7 from 15K in the setting of new fevers up to 101F. He denies worsening cough, shortness of breath nor dysuria. His abdomen is more distended. Having some  fluid and gas collecting in the ostomy bag   Recent lumbar fusion hx -------------------------------------------------------------- On 04/04/22, he underwent the following surgery by Dr. Sherlyn Lick at [facility]: 1. Right-sided L2-3, L3-4, L4-5 transforaminal lumbar interbody fusion with 1 titanium cage at each level (49675, 22634 x 2, 22853 x 3)  2. Posterior spinal fusion at L2-3, L3-4, L4-5 3. Posterior spinal segmental instrumentation at L2, L3, L4, and L5 with a 1 pedicle screw at each level bilaterally (91638) 4.  Right-sided hemilaminectomy at  L2-3 with decompression of the lateral recess in a level where a transforaminal lumbar interbody fusion was performed (46659) 5. Right-sided hemilaminectomy at L3-4 with decompression of the lateral recess in a level where a transforaminal lumbar interbody fusion was performed(63053) 6. Right-sided hemilaminectomy at L4-5 with decompression of the lateral recess in a level where a transforaminal lumbar interbody fusion was performed(63053) 7. Bone grafting using patient's own autograft,allograft and BMP(20930,20936) 8. Placement of pedicle screws with navigation and stealth as compared with pre-operative imaging.[phone])    Past Medical History:  Diagnosis Date   Allergy    Arthritis    osteo   CTS (carpal tunnel syndrome)    DJD (degenerative joint disease)    DM2 (diabetes mellitus, type 2) (HCC)    DVT (deep venous thrombosis) (HCC)    GERD (gastroesophageal reflux disease)    Gout    History of adenomatous polyps of colon 12/29/2019   History of nuclear stress test    Myoview 11/17: EF 54, no ST changes, hypertensive blood  pressure response, no ischemia, low risk   Hyperlipidemia    Hypothyroidism    Nicotine addiction    OSA (obstructive sleep apnea)    Prostatitis    Reactive airway disease    Sarcocystosis    Sinus arrhythmia    Sleep apnea    cpap- not wearing   Tinea corporis     Allergies:   Allergies  Allergen Reactions   Bee Venom     Other reaction(s): Unknown   Lisinopril     Other reaction(s): Unknown   Metoclopramide Other (See Comments)    Other reaction(s): shaking too bad "Sweat like crazy"    Penicillins Hives    Did it involve swelling of the face/tongue/throat, SOB, or low BP? Yes Did it involve sudden or severe rash/hives, skin peeling, or any reaction on the inside of your mouth or nose? Yes Did you need to seek medical attention at a hospital or doctor's office? No When did it last happen?  childhood      If all above answers are "NO", may proceed with cephalosporin use.     Shellfish Allergy Swelling     MEDICATIONS:  acetaminophen  650 mg Oral Q6H   [START ON 04/18/2022] amLODipine  10 mg Oral Daily   Chlorhexidine Gluconate Cloth  6 each Topical Daily   insulin aspart  0-9 Units Subcutaneous Q4H   [START ON 04/22/2022] levothyroxine  25 mcg Intravenous Daily   metoprolol tartrate  25 mg Oral BID   multivitamin with minerals  1 tablet Oral Daily   pantoprazole (PROTONIX) IV  40 mg Intravenous Daily   Ensure Max Protein  11 oz Oral BID    Social History   Tobacco Use   Smoking status: Former    Packs/day: 0.50    Types: Cigarettes   Smokeless tobacco: Never   Tobacco comments:    Quit March 2020  Vaping Use   Vaping Use: Never used  Substance Use Topics   Alcohol use: Yes    Alcohol/week: 21.0 standard drinks of alcohol    Types: 21 Cans of beer per week    Comment: 2 beers per day   Drug use: No    Family History  Problem Relation Age of Onset   Stroke Mother    Diabetes Father    Heart disease Father        sp CABG   Diabetes Paternal Grandmother    Colon cancer Neg Hx    Stomach cancer Neg Hx    Esophageal cancer Neg Hx    Rectal cancer Neg Hx    Prostate cancer Neg Hx      Review of Systems  Constitutional: Negative for fever, chills, diaphoresis, activity change, appetite change, fatigue and unexpected weight  change.  HENT: Negative for congestion, sore throat, rhinorrhea, sneezing, trouble swallowing and sinus pressure.  Eyes: Negative for photophobia and visual disturbance.  Respiratory: Negative for cough, chest tightness, shortness of breath, wheezing and stridor.  Cardiovascular: Negative for chest pain, palpitations and leg swelling.  Gastrointestinal: + abdominal distension. Negative for nausea, vomiting, abdominal pain, diarrhea, constipation, blood in stool, abdominal distention and anal bleeding.  Genitourinary: Negative for dysuria, hematuria, flank pain and difficulty urinating.  Musculoskeletal: Negative for myalgias, back pain, joint swelling, arthralgias and gait problem.  Skin: Negative for color change, pallor, rash and wound.  Neurological: Negative for dizziness, tremors, weakness and light-headedness.  Hematological: Negative for adenopathy. Does not bruise/bleed easily.  Psychiatric/Behavioral: Negative for behavioral problems, confusion, sleep  disturbance, dysphoric mood, decreased concentration and agitation.    OBJECTIVE: Temp:  [97.8 F (36.6 C)-101.9 F (38.8 C)] 101 F (38.3 C) (12/05 1518) Pulse Rate:  [99-130] 110 (12/05 1300) Resp:  [13-31] 28 (12/05 1100) BP: (118-174)/(59-89) 156/80 (12/05 1300) SpO2:  [85 %-98 %] 94 % (12/05 1300) Weight:  [108.5 kg] 108.5 kg (12/05 0459) Physical Exam  Constitutional: He is oriented to person, place, and time. He appears well-developed and well-nourished. No distress.  HENT:  Mouth/Throat: Oropharynx is clear and moist. No oropharyngeal exudate.  Cardiovascular: Normal rate, regular rhythm and normal heart sounds. Exam reveals no gallop and no friction rub.  No murmur heard.  Pulmonary/Chest: Effort normal and breath sounds normal. No respiratory distress. He has no wheezes.  Abdominal: Soft. Bowel sounds are normal. +distension. Ostomy in place. Incision no erythema or exudate. There is no tenderness.  Lymphadenopathy:   He has no cervical adenopathy.  Neurological: He is alert and oriented to person, place, and time.  Skin: Skin is warm and dry. No rash noted. No erythema.  Psychiatric: He has a normal mood and affect. His behavior is normal.    LABS: Results for orders placed or performed during the hospital encounter of 04/08/22 (from the past 48 hour(s))  Glucose, capillary     Status: Abnormal   Collection Time: 04/15/22  3:59 PM  Result Value Ref Range   Glucose-Capillary 108 (H) 70 - 99 mg/dL    Comment: Glucose reference range applies only to samples taken after fasting for at least 8 hours.   Comment 1 Notify RN    Comment 2 Document in Chart   Glucose, capillary     Status: Abnormal   Collection Time: 04/15/22  7:30 PM  Result Value Ref Range   Glucose-Capillary 108 (H) 70 - 99 mg/dL    Comment: Glucose reference range applies only to samples taken after fasting for at least 8 hours.   Comment 1 Notify RN    Comment 2 Document in Chart   Hemoglobin and hematocrit, blood     Status: Abnormal   Collection Time: 04/15/22  9:50 PM  Result Value Ref Range   Hemoglobin 10.1 (L) 13.0 - 17.0 g/dL   HCT 32.3 (L) 39.0 - 52.0 %    Comment: Performed at [facility], [place] [address] [place], [place] [address]  Glucose, capillary     Status: Abnormal   Collection Time: 04/15/22 11:41 PM  Result Value Ref Range   Glucose-Capillary 108 (H) 70 - 99 mg/dL    Comment: Glucose reference range applies only to samples taken after fasting for at least 8 hours.   Comment 1 Notify RN    Comment 2 Document in Chart   Glucose, capillary     Status: Abnormal   Collection Time: 04/16/22  3:42 AM  Result Value Ref Range   Glucose-Capillary 100 (H) 70 - 99 mg/dL    Comment: Glucose reference range applies only to samples taken after fasting for at least 8 hours.  CBC     Status: Abnormal   Collection Time: 04/16/22  5:34 AM  Result Value Ref Range   WBC 15.1 (H) 4.0 - 10.5 K/uL   RBC 3.11  (L) 4.22 - 5.81 MIL/uL   Hemoglobin 9.1 (L) 13.0 - 17.0 g/dL   HCT 28.4 (L) 39.0 - 52.0 %   MCV 91.3 80.0 - 100.0 fL   MCH 29.3 26.0 - 34.0 pg   MCHC 32.0 30.0 - 36.0  g/dL   RDW 16.5 (H) 11.5 - 15.5 %   Platelets 218 150 - 400 K/uL   nRBC 0.5 (H) 0.0 - 0.2 %    Comment: Performed at [facility], [place] [address], [place], [place] [address]  Phosphorus     Status: None   Collection Time: 04/16/22  5:34 AM  Result Value Ref Range   Phosphorus 3.0 2.5 - 4.6 mg/dL    Comment: Performed at [facility], [place] [address], [place], [place] [address]  Heparin level (unfractionated)     Status: None   Collection Time: 04/16/22  5:34 AM  Result Value Ref Range   Heparin Unfractionated 0.34 0.30 - 0.70 IU/mL    Comment: (NOTE) The clinical reportable range upper limit is being lowered to >1.10 to align with the FDA approved guidance for the current laboratory assay.  If heparin results are below expected values, and patient dosage has  been confirmed, suggest follow up testing of antithrombin III levels. Performed at [facility], [place] [address] [place], [place] [address]   Hemoglobin and hematocrit, blood     Status: Abnormal   Collection Time: 04/16/22  5:34 AM  Result Value Ref Range   Hemoglobin 9.0 (L) 13.0 - 17.0 g/dL   HCT 28.0 (L) 39.0 - 52.0 %    Comment: Performed at [facility], [place] [address], [place], [place] [address]  Procalcitonin - Baseline     Status: None   Collection Time: 04/16/22  5:34 AM  Result Value Ref Range   Procalcitonin 0.62 ng/mL    Comment:        Interpretation: PCT > 0.5 ng/mL and <= 2 ng/mL: Systemic infection (sepsis) is possible, but other conditions are known to elevate PCT as well. (NOTE)       Sepsis PCT Algorithm           Lower Respiratory Tract                                      Infection PCT Algorithm    ----------------------------      ----------------------------         PCT < 0.25 ng/mL                PCT < 0.10 ng/mL          Strongly encourage             Strongly discourage   discontinuation of antibiotics    initiation of antibiotics    ----------------------------     -----------------------------       PCT 0.25 - 0.50 ng/mL            PCT 0.10 - 0.25 ng/mL               OR       >80% decrease in PCT            Discourage initiation of                                            antibiotics      Encourage discontinuation           of antibiotics    ----------------------------     -----------------------------  PCT >= 0.50 ng/mL              PCT 0.26 - 0.50 ng/mL                AND       <80% decrease in PCT             Encourage initiation of                                             antibiotics       Encourage continuation           of antibiotics    ----------------------------     -----------------------------        PCT >= 0.50 ng/mL                  PCT > 0.50 ng/mL               AND         increase in PCT                  Strongly encourage                                      initiation of antibiotics    Strongly encourage escalation           of antibiotics                                     -----------------------------                                           PCT <= 0.25 ng/mL                                                 OR                                        > 80% decrease in PCT                                      Discontinue / Do not initiate                                             antibiotics  Performed at [place] [address], [place], [place] [address]   Basic metabolic panel     Status: Abnormal   Collection Time: 04/16/22  5:34 AM  Result Value Ref Range   Sodium 139 135 - 145 mmol/L   Potassium 3.4 (L) 3.5 - 5.1 mmol/L   Chloride 106 98 - 111 mmol/L  CO2 23 22 - 32 mmol/L   Glucose, Bld 113 (H) 70 - 99 mg/dL    Comment: Glucose  reference range applies only to samples taken after fasting for at least 8 hours.   BUN 9 8 - 23 mg/dL   Creatinine, Ser 0.92 0.61 - 1.24 mg/dL   Calcium 8.2 (L) 8.9 - 10.3 mg/dL   GFR, Estimated >60 >60 mL/min    Comment: (NOTE) Calculated using the CKD-EPI Creatinine Equation (2021)    Anion gap 10 5 - 15    Comment: Performed at [facility], [place] [address], [place], [place] [address]  Glucose, capillary     Status: Abnormal   Collection Time: 04/16/22  8:07 AM  Result Value Ref Range   Glucose-Capillary 115 (H) 70 - 99 mg/dL    Comment: Glucose reference range applies only to samples taken after fasting for at least 8 hours.   Comment 1 Notify RN    Comment 2 Document in Chart   Hemoglobin and hematocrit, blood     Status: Abnormal   Collection Time: 04/16/22 10:07 AM  Result Value Ref Range   Hemoglobin 9.5 (L) 13.0 - 17.0 g/dL   HCT 29.1 (L) 39.0 - 52.0 %    Comment: Performed at [facility], [place] [address], [place], [place] [address]  Glucose, capillary     Status: Abnormal   Collection Time: 04/16/22 11:46 AM  Result Value Ref Range   Glucose-Capillary 119 (H) 70 - 99 mg/dL    Comment: Glucose reference range applies only to samples taken after fasting for at least 8 hours.   Comment 1 Notify RN    Comment 2 Document in Chart   Glucose, capillary     Status: Abnormal   Collection Time: 04/16/22  3:53 PM  Result Value Ref Range   Glucose-Capillary 111 (H) 70 - 99 mg/dL    Comment: Glucose reference range applies only to samples taken after fasting for at least 8 hours.   Comment 1 Notify RN    Comment 2 Document in Chart   Glucose, capillary     Status: Abnormal   Collection Time: 04/16/22  7:35 PM  Result Value Ref Range   Glucose-Capillary 107 (H) 70 - 99 mg/dL    Comment: Glucose reference range applies only to samples taken after fasting for at least 8 hours.  Glucose, capillary     Status: Abnormal   Collection Time:  04/16/22 11:48 PM  Result Value Ref Range   Glucose-Capillary 114 (H) 70 - 99 mg/dL    Comment: Glucose reference range applies only to samples taken after fasting for at least 8 hours.  CBC     Status: Abnormal   Collection Time: 04/17/22  2:46 AM  Result Value Ref Range   WBC 22.7 (H) 4.0 - 10.5 K/uL   RBC 3.05 (L) 4.22 - 5.81 MIL/uL   Hemoglobin 8.9 (L) 13.0 - 17.0 g/dL   HCT 27.7 (L) 39.0 - 52.0 %   MCV 90.8 80.0 - 100.0 fL   MCH 29.2 26.0 - 34.0 pg   MCHC 32.1 30.0 - 36.0 g/dL   RDW 16.5 (H) 11.5 - 15.5 %   Platelets 239 150 - 400 K/uL   nRBC 0.4 (H) 0.0 - 0.2 %    Comment: Performed at [facility], [place] [address], [place], [place] [address]  Heparin level (unfractionated)     Status: Abnormal   Collection Time: 04/17/22  2:46  AM  Result Value Ref Range   Heparin Unfractionated 0.29 (L) 0.30 - 0.70 IU/mL    Comment: (NOTE) The clinical reportable range upper limit is being lowered to >1.10 to align with the FDA approved guidance for the current laboratory assay.  If heparin results are below expected values, and patient dosage has  been confirmed, suggest follow up testing of antithrombin III levels. Performed at [facility], [place] [address] [place], [place] [address]   Basic metabolic panel     Status: Abnormal   Collection Time: 04/17/22  2:46 AM  Result Value Ref Range   Sodium 140 135 - 145 mmol/L   Potassium 3.4 (L) 3.5 - 5.1 mmol/L   Chloride 106 98 - 111 mmol/L   CO2 24 22 - 32 mmol/L   Glucose, Bld 104 (H) 70 - 99 mg/dL    Comment: Glucose reference range applies only to samples taken after fasting for at least 8 hours.   BUN 8 8 - 23 mg/dL   Creatinine, Ser 0.89 0.61 - 1.24 mg/dL   Calcium 8.0 (L) 8.9 - 10.3 mg/dL   GFR, Estimated >60 >60 mL/min    Comment: (NOTE) Calculated using the CKD-EPI Creatinine Equation (2021)    Anion gap 10 5 - 15    Comment: Performed at [facility], [place] [address], [place], [place] [address]  Glucose, capillary     Status: Abnormal   Collection Time: 04/17/22  3:56 AM  Result Value Ref Range   Glucose-Capillary 121 (H) 70 - 99 mg/dL    Comment: Glucose reference range applies only to samples taken after fasting for at least 8 hours.  Glucose, capillary     Status: Abnormal   Collection Time: 04/17/22  8:17 AM  Result Value Ref Range   Glucose-Capillary 103 (H) 70 - 99 mg/dL    Comment: Glucose reference range applies only to samples taken after fasting for at least 8 hours.   Comment 1 Notify RN    Comment 2 Document in Chart   Procalcitonin - Baseline     Status: None   Collection Time: 04/17/22  9:43 AM  Result Value Ref Range   Procalcitonin 1.30 ng/mL    Comment:        Interpretation: PCT > 0.5 ng/mL and <= 2 ng/mL: Systemic infection (sepsis) is possible, but other conditions are known to elevate PCT as well. (NOTE)       Sepsis PCT Algorithm           Lower Respiratory Tract                                      Infection PCT Algorithm    ----------------------------     ----------------------------         PCT < 0.25 ng/mL                PCT < 0.10 ng/mL          Strongly encourage             Strongly discourage   discontinuation of antibiotics    initiation of antibiotics    ----------------------------     -----------------------------       PCT 0.25 - 0.50 ng/mL            PCT 0.10 - 0.25 ng/mL  OR       >80% decrease in PCT            Discourage initiation of                                            antibiotics      Encourage discontinuation           of antibiotics    ----------------------------     -----------------------------         PCT >= 0.50 ng/mL              PCT 0.26 - 0.50 ng/mL                AND       <80% decrease in PCT             Encourage initiation of                                             antibiotics       Encourage continuation           of antibiotics     ----------------------------     -----------------------------        PCT >= 0.50 ng/mL                  PCT > 0.50 ng/mL               AND         increase in PCT                  Strongly encourage                                      initiation of antibiotics    Strongly encourage escalation           of antibiotics                                     -----------------------------                                           PCT <= 0.25 ng/mL                                                 OR                                        > 80% decrease in PCT                                      Discontinue / Do not initiate  antibiotics  Performed at [facility], [place] [address], [place], [place] [address]   Urinalysis, Routine w reflex microscopic     Status: Abnormal   Collection Time: 04/17/22 11:48 AM  Result Value Ref Range   Color, Urine AMBER (A) YELLOW    Comment: BIOCHEMICALS MAY BE AFFECTED BY COLOR   APPearance CLEAR CLEAR   Specific Gravity, Urine 1.019 1.005 - 1.030   pH 5.0 5.0 - 8.0   Glucose, UA NEGATIVE NEGATIVE mg/dL   Hgb urine dipstick MODERATE (A) NEGATIVE   Bilirubin Urine NEGATIVE NEGATIVE   Ketones, ur 20 (A) NEGATIVE mg/dL   Protein, ur 30 (A) NEGATIVE mg/dL   Nitrite NEGATIVE NEGATIVE   Leukocytes,Ua SMALL (A) NEGATIVE   RBC / HPF 11-20 0 - 5 RBC/hpf   WBC, UA 21-50 0 - 5 WBC/hpf   Bacteria, UA RARE (A) NONE SEEN   Squamous Epithelial / LPF 0-5 0 - 5   Mucus PRESENT     Comment: Performed at [facility], [place] [address] [place],  [address]  Glucose, capillary     Status: Abnormal   Collection Time: 04/17/22 12:30 PM  Result Value Ref Range   Glucose-Capillary 116 (H) 70 - 99 mg/dL    Comment: Glucose reference range applies only to samples taken after fasting for at least 8 hours.   Comment 1 Notify RN    Comment 2 Document in Chart   Heparin level  (unfractionated)     Status: None   Collection Time: 04/17/22 12:47 PM  Result Value Ref Range   Heparin Unfractionated 0.43 0.30 - 0.70 IU/mL    Comment: (NOTE) The clinical reportable range upper limit is being lowered to >1.10 to align with the FDA approved guidance for the current laboratory assay.  If heparin results are below expected values, and patient dosage has  been confirmed, suggest follow up testing of antithrombin III levels. Performed at [facility], [place] [address], [place],  [address]     MICRO: reviewed IMAGING: DG CHEST PORT 1 VIEW  Result Date: 04/17/2022 CLINICAL DATA:  Fever. EXAM: PORTABLE CHEST 1 VIEW COMPARISON:  AP chest 04/12/2022 in 04/10/2022; CT chest 04/08/2022 FINDINGS: Interval extubation and removal of enteric tube. Surgical clips overlie the right upper abdominal quadrant. Mildly decreased lung volumes. The lungs are clear. No pleural effusion or pneumothorax. Heart size is at the upper limits of normal. Mediastinal contours are within normal limits. Surgical clips overlie the left thyroid gland as seen on prior CT. Partial visualization of posterior right cervical spine laminoplasty hardware. IMPRESSION: Interval extubation and removal of enteric tube. No acute cardiopulmonary process. Electronically Signed   By: Yvonne Kendall M.D.   On: 04/17/2022 10:07   DG Abd Portable 2V  Result Date: 04/17/2022 CLINICAL DATA:  Fever EXAM: PORTABLE ABDOMEN - 2 VIEW COMPARISON:  Abdominal radiograph dated April 10, 2022 FINDINGS: Gaseous distention of multiple colonic loops suggesting ileus. No evidence of extraluminal free air. Surgical staples over the midabdomen and lumbar spine hardware is unchanged. Bilateral hip arthroplasty with intact hardware. IMPRESSION: Gaseous distention of multiple colonic loops suggesting ileus. Electronically Signed   By: Keane Police D.O.   On: 04/17/2022 09:57     Assessment/Plan:  66yo M with hx of  hemoperitoneum after being placed on anticoagulation for acute PE s/p colostomy and end ileostomy. Recently had lumbar fusion at end of thanksgiving which also maybe a risk factor for possible source of SSI.  -  appears stable but concern for SSI (be it from lumbar fusion vs. Recent abdominal surgery) - recommend to get repeat abd CT tomorrow - if that is not finding any new fluid collection would recommend to do imaging of spine - if continues to have fevers/or SIRS, can empirically start amp/sub or pip/tazo. GNR and anaerobic coverage. Can do daptomycin instead of vancomycin for MRSA coverage if clinically worsening. - will check MRSA colonization

## 2022-04-17 NOTE — Progress Notes (Signed)
[facility]                                                                              Kyle Delacruz, is a 66 y.o. male, DOB - 10/04/1955, ELF:[id number] Admit date - 04/08/2022    Outpatient Primary MD for the patient is [name], [name], MD  LOS - 9  days  Chief Complaint  Patient presents with   Constipation       Brief summary   Patient is a 66 year old male with provoked DVT who presented after lumbar surgery with worsening shortness of breath and RLE swelling. 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  Echo/CTA showing some RV strain. Hospital course complicated by severe constipation vs. colonic ileus partially relieved with enema 04/10/22.  He developed searing epigastric pain with radiation to shoulder, and worsened with movement after enema. He subsequently developed worsening hypoxia, tachycardia and hypotension requiring intubation on 11/28.  CT results showed a large pericolonic hematoma tracking into the peritoneum and right hemiliver.  Mass transfusion protocol was activated and he was evaluated by surgery and underwent emergency ex lap with total colectomy and end ileostomy on 04/10/2022.  Started on IV heparin on 11/29 He was extubated on 11/30 and weaned off of vasopressors on 12/1  [facility] assumed care on 04/15/2022   Assessment & Plan    Principal Problem: Acute submassive PE (pulmonary thromboembolism) (HCC)/ RLE DVT, hemorrhagic shock -CTA chest showed acute PE in the upper lobe branch of right pulmonary artery, CT evidence of right heart strain consistent with submassive PE. -Weaned off of vasopressors  -Continue IV heparin drip. - d/w CCM, Dr Erin Fulling on 12/3, recommended eliquis/DOACs until he is surgically cleared, return of bowel function and no further need for any surgical procedures.  Once medically cleared, transition to DOACs for PE/DVT.  Likely will need prophylactic dosing long term after 3-6 months of full dose treatment.     Active Problems:    Acute respiratory failure with hypoxia ([place]) - Patient was extubated on 04/12/2022 -O2 sats 97% on room air  Post colonic ileus, acute blood loss anemia, intra-abdominal hematoma s/ ex lap, hemorrhagic shock -Underwent exploratory laparotomy, total colectomy, end ileostomy on 11/28 - now spiking fevers with tachycardia, leukocytosis trending up -On exam, noted to have abdominal distention with hypoactive bowel sounds, abdominal x-ray + ileus.  CXR with no acute pneumonia or infiltrates. -Obtain blood cultures, UA + ketones, WBCs 21-50, rare bacteria, small leukocytes, negative nitrates.  Will check urine culture  -Procalcitonin 1.3, WBCs trended up to 22.7, consulted ID, d/w Dr Baxter Flattery -Surgery following    Hypothyroidism -Continue IV Synthroid 25 mcg daily until taking consistently p.o. then will transition back to oral Synthroid  -outpatient dose Synthroid 50 mcg p.o. daily  Accelerated hypertension -Continue Norvasc, metoprolol    Gastro-esophageal reflux disease without esophagitis -Continue IV PPI  Hyperglycemia -Continue sliding scale insulin while inpatient.  Not on any oral hypoglycemics outpatient -Hemoglobin A1c 5.9 on 11/30  Hypokalemia -Replaced   Obesity Estimated body mass index is 34.32 kg/m as calculated from the following:   Height as of this encounter: '5\' 10"'$  (1.778 m).   Weight as of  this encounter: 108.5 kg.  Code Status: Full CODE STATUS DVT Prophylaxis:  Place TED hose Start: 04/09/22 0021   Level of Care: Level of care: ICU Family Communication:  Disposition Plan:      Remains inpatient appropriate:   Procedures:  11/28 EXPLORATORY LAPAROTOMY, TOTAL COLECTOMY, END ILEOSTOMY (N/A)  Intubation 11/28, extubated on 11/30  Consultants:   General surgery CCM  Antimicrobials:   Anti-infectives (From admission, onward)    Start     Dose/Rate Route Frequency Ordered Stop   04/10/22 2107  metroNIDAZOLE (FLAGYL) 500 MG/100ML IVPB       Note to  Pharmacy: Enis Gash W: cabinet override      04/10/22 2107 04/10/22 2114   04/10/22 2103  ceFAZolin (ANCEF) 2-4 GM/100ML-% IVPB       Note to Pharmacy: Enis Gash W: cabinet override      04/10/22 2103 04/11/22 0914          Medications  acetaminophen  650 mg Oral Q6H   amLODipine  5 mg Oral Daily   Chlorhexidine Gluconate Cloth  6 each Topical Daily   insulin aspart  0-9 Units Subcutaneous Q4H   [START ON 04/22/2022] levothyroxine  25 mcg Intravenous Daily   metoprolol tartrate  25 mg Oral BID   multivitamin with minerals  1 tablet Oral Daily   pantoprazole (PROTONIX) IV  40 mg Intravenous Daily   Ensure Max Protein  11 oz Oral BID      Subjective:   Kyle Delacruz was seen and examined today.  Spiking fevers, 101.5 degree family, tachycardiac, pain is controlled however abdomen seems to be distended, no active nausea or vomiting.    Objective:   Vitals:   04/17/22 1200 04/17/22 1231 04/17/22 1300 04/17/22 1345  BP: (!) 156/72  (!) 156/80   Pulse: (!) 130 (!) 110 (!) 110   Resp:      Temp: (!) 101.5 F (38.6 C)   99.6 F (37.6 C)  TempSrc: Axillary   Oral  SpO2: (!) 85% (!) 88% 94%   Weight:      Height:        Intake/Output Summary (Last 24 hours) at 04/17/2022 1352 Last data filed at 04/17/2022 1300 Gross per 24 hour  Intake 3493.06 ml  Output 1300 ml  Net 2193.06 ml     Wt Readings from Last 3 Encounters:  04/17/22 108.5 kg  02/14/22 106.6 kg  02/05/22 107 kg   Physical Exam General: Alert and oriented x 3, NAD Cardiovascular: S1 S2 clear, RRR.  Respiratory: CTAB, no wheezing Gastrointestinal: Soft, distended, NT, dressing intact, stool in the ostomy Ext: no pedal edema bilaterally Neuro: no new deficits Psych: Normal affect     Data Reviewed:  I have personally reviewed following labs    CBC Lab Results  Component Value Date   WBC 22.7 (H) 04/17/2022   RBC 3.05 (L) 04/17/2022   HGB 8.9 (L) 04/17/2022   HCT 27.7 (L)  04/17/2022   MCV 90.8 04/17/2022   MCH 29.2 04/17/2022   PLT 239 04/17/2022   MCHC 32.1 04/17/2022   RDW 16.5 (H) 04/17/2022   LYMPHSABS 1.7 04/08/2022   MONOABS 1.3 (H) 04/08/2022   EOSABS 0.1 04/08/2022   BASOSABS 0.0 59/56/3875     Last metabolic panel Lab Results  Component Value Date   NA 140 04/17/2022   K 3.4 (L) 04/17/2022   CL 106 04/17/2022   CO2 24 04/17/2022   BUN 8 04/17/2022   CREATININE 0.89 04/17/2022  GLUCOSE 104 (H) 04/17/2022   GFRNONAA >60 04/17/2022   GFRAA >60 08/19/2019   CALCIUM 8.0 (L) 04/17/2022   PHOS 3.0 04/16/2022   PROT 6.3 (L) 07/27/2021   ALBUMIN 2.9 (L) 07/27/2021   BILITOT 1.3 (H) 07/27/2021   ALKPHOS 50 07/27/2021   AST 14 (L) 07/27/2021   ALT 13 07/27/2021   ANIONGAP 10 04/17/2022    CBG (last 3)  Recent Labs    04/17/22 0356 04/17/22 0817 04/17/22 1230  GLUCAP 121* 103* 116*      Coagulation Profile: No results for input(s): "INR", "PROTIME" in the last 168 hours.   Radiology Studies: I have personally reviewed the imaging studies  DG CHEST PORT 1 VIEW  Result Date: 04/17/2022 CLINICAL DATA:  Fever. EXAM: PORTABLE CHEST 1 VIEW COMPARISON:  AP chest 04/12/2022 in 04/10/2022; CT chest 04/08/2022 FINDINGS: Interval extubation and removal of enteric tube. Surgical clips overlie the right upper abdominal quadrant. Mildly decreased lung volumes. The lungs are clear. No pleural effusion or pneumothorax. Heart size is at the upper limits of normal. Mediastinal contours are within normal limits. Surgical clips overlie the left thyroid gland as seen on prior CT. Partial visualization of posterior right cervical spine laminoplasty hardware. IMPRESSION: Interval extubation and removal of enteric tube. No acute cardiopulmonary process. Electronically Signed   By: Yvonne Kendall M.D.   On: 04/17/2022 10:07   DG Abd Portable 2V  Result Date: 04/17/2022 CLINICAL DATA:  Fever EXAM: PORTABLE ABDOMEN - 2 VIEW COMPARISON:  Abdominal radiograph  dated April 10, 2022 FINDINGS: Gaseous distention of multiple colonic loops suggesting ileus. No evidence of extraluminal free air. Surgical staples over the midabdomen and lumbar spine hardware is unchanged. Bilateral hip arthroplasty with intact hardware. IMPRESSION: Gaseous distention of multiple colonic loops suggesting ileus. Electronically Signed   By: Keane Police D.O.   On: 04/17/2022 09:57       Kyle Delacruz M.D. [facility] 04/17/2022, 1:52 PM  Available via Epic secure chat 7am-7pm After 7 pm, please refer to night coverage provider listed on amion.

## 2022-04-17 NOTE — Progress Notes (Signed)
Occupational Therapy Treatment Patient Details Name: Kyle Delacruz MRN: [id number] DOB: [birth year] Today's Date: 04/17/2022   History of present illness Pt is a 66yo male presenting to [facility] ED on 11/26 with worsening SOB and RLE swelling with revealed PE, also constipation. Pt is s/p L2-L5 decompression & fusion 11/22. Underwent emergency exploratory laparatomy with total colectomy and end ileostomy on 11/28, also intubated 11/28. Extubated 11/30.  PMH: DM, hx of DVT 07/2021, GERD, gout, HLD, hypothyroidism, OSA, OSA non-compliant with CPAP, b/l TKA R-1997 L-2001, b/l THA R-2006 L-2009.   OT comments  Patient was re educated on back precautions with patient having difficult time remembering Twisting. Patient's wife in room was able to cue patient on precautions with 100% accuracy. Patient was noted to have edema in R hand with noted dependent positioning with IV in place on this UE. Education was provided to reduce risk of edema. Patient verbalized and demonstrated understanding. Nurse made aware. Patient continued to have decreased functional activity tolerance, decreased standing balance, decreased memory of precautions and how to apply them during ADLs impacting independence.  Patient would continue to benefit from skilled OT services at this time while admitted and after d/c to address noted deficits in order to improve overall safety and independence in ADLs.     Recommendations for follow up therapy are one component of a multi-disciplinary discharge planning process, led by the attending physician.  Recommendations may be updated based on patient status, additional functional criteria and insurance authorization.    Follow Up Recommendations  Home health OT     Assistance Recommended at Discharge Frequent or constant Supervision/Assistance  Patient can return home with the following  A little help with walking and/or transfers;A lot of help with bathing/dressing/bathroom;Assistance with  cooking/housework;Help with stairs or ramp for entrance   Equipment Recommendations  None recommended by OT    Recommendations for Other Services      Precautions / Restrictions Precautions Precautions: Fall;Back;Other (comment) Precaution Booklet Issued: No Precaution Comments: Reviewed verbally Restrictions Weight Bearing Restrictions: No Other Position/Activity Restrictions: wbat. Unable to wear spinal brace due to new abdominal incision and colostomy       Mobility Bed Mobility Overal bed mobility: Needs Assistance   Rolling: Min assist Sidelying to sit: Min assist       General bed mobility comments: with education on maintaining back preacutions during session.         Balance Overall balance assessment: Mild deficits observed, not formally tested                   ADL either performed or assessed with clinical judgement   ADL Overall ADL's : Needs assistance/impaired     Grooming: Set up;Sitting;Wash/dry face;Oral care Grooming Details (indicate cue type and reason): in recliner. with education provided on importance of completion each day                 Toilet Transfer: Minimal assistance;+2 for safety/equipment Toilet Transfer Details (indicate cue type and reason): requires +2 asssistance to manage lines/leads - min assist for steadying and walker to transfer to recliner in room with HR noted to reach 134 bpm with activity.                  Cognition Arousal/Alertness: Awake/alert Behavior During Therapy: WFL for tasks assessed/performed Overall Cognitive Status: Within Functional Limits for tasks assessed                   General Comments:  Patient was plesant and cooperative during session. motivated to get out of bed                   Pertinent Vitals/ Pain       Pain Assessment Pain Assessment: Faces Faces Pain Scale: Hurts even more Pain Location: abdomen, low back Pain Descriptors / Indicators: Operative  site guarding, Sore Pain Intervention(s): Limited activity within patient's tolerance, Monitored during session, Repositioned   Frequency  Min 2X/week        Progress Toward Goals  OT Goals(current goals can now be found in the care plan section)  Progress towards OT goals: Progressing toward goals     Plan Discharge plan remains appropriate       AM-PAC OT "6 Clicks" Daily Activity     Outcome Measure   Help from another person eating meals?: Total (ice chips only) Help from another person taking care of personal grooming?: A Little Help from another person toileting, which includes using toliet, bedpan, or urinal?: Total Help from another person bathing (including washing, rinsing, drying)?: A Lot Help from another person to put on and taking off regular upper body clothing?: A Lot Help from another person to put on and taking off regular lower body clothing?: Total 6 Click Score: 10    End of Session Equipment Utilized During Treatment: Rolling walker (2 wheels);Oxygen  OT Visit Diagnosis: Pain   Activity Tolerance Patient limited by pain   Patient Left in chair;with call bell/phone within reach;with family/visitor present   Nurse Communication Mobility status        Time: 3300-7622 OT Time Calculation (min): 24 min  Charges: OT General Charges $OT Visit: 1 Visit OT Treatments $Self Care/Home Management : 23-37 mins  Rennie Plowman, MS Acute Rehabilitation Department Office# [phone]   Willa Rough 04/17/2022, 4:03 PM

## 2022-04-17 NOTE — Progress Notes (Signed)
Progress Note  7 Days Post-Op  Subjective: Pt tolerated CLD yesterday and NGT was removed. He is having ileostomy output. Some distention but overall improving. He denies severe abdominal pain but is sore. He has been getting out of bed. Denies n/v this AM.   Objective: Vital signs in last 24 hours: Temp:  [97.8 F (36.6 C)-101.9 F (38.8 C)] 101.9 F (38.8 C) (12/05 0800) Pulse Rate:  [99-118] 118 (12/05 0800) Resp:  [13-31] 13 (12/05 0800) BP: (127-184)/(59-95) 160/77 (12/05 0800) SpO2:  [93 %-98 %] 93 % (12/05 0800) Weight:  [108.5 kg] 108.5 kg (12/05 0459) Last BM Date : 04/13/22  Intake/Output from previous day: 12/04 0701 - 12/05 0700 In: 3053.2 [P.O.:300; I.V.:2753.2] Out: 900 [Urine:675; Stool:225] Intake/Output this shift: Total I/O In: 237.6 [I.V.:237.6] Out: 200 [Urine:200]  PE: General: pleasant, WD, obese male who is laying in bed in NAD Heart: sinus tachycardia 110-120 Lungs: Respiratory effort nonlabored Abd: soft, appropriately ttp, mild to mod distention, +BS, ileostomy viable with liquid output in bag, incision C/D/I with staples present   Lab Results:  Recent Labs    04/16/22 0534 04/16/22 1007 04/17/22 0246  WBC 15.1*  --  22.7*  HGB 9.0*  9.1* 9.5* 8.9*  HCT 28.0*  28.4* 29.1* 27.7*  PLT 218  --  239   BMET Recent Labs    04/16/22 0534 04/17/22 0246  NA 139 140  K 3.4* 3.4*  CL 106 106  CO2 23 24  GLUCOSE 113* 104*  BUN 9 8  CREATININE 0.92 0.89  CALCIUM 8.2* 8.0*   PT/INR No results for input(s): "LABPROT", "INR" in the last 72 hours. CMP     Component Value Date/Time   NA 140 04/17/2022 0246   K 3.4 (L) 04/17/2022 0246   CL 106 04/17/2022 0246   CO2 24 04/17/2022 0246   GLUCOSE 104 (H) 04/17/2022 0246   BUN 8 04/17/2022 0246   CREATININE 0.89 04/17/2022 0246   CALCIUM 8.0 (L) 04/17/2022 0246   PROT 6.3 (L) 07/27/2021 0556   ALBUMIN 2.9 (L) 07/27/2021 0556   AST 14 (L) 07/27/2021 0556   ALT 13 07/27/2021 0556    ALKPHOS 50 07/27/2021 0556   BILITOT 1.3 (H) 07/27/2021 0556   GFRNONAA >60 04/17/2022 0246   GFRAA >60 08/19/2019 1417   Lipase     Component Value Date/Time   LIPASE 42 04/08/2022 0818       Studies/Results: No results found.  Anti-infectives: Anti-infectives (From admission, onward)    Start     Dose/Rate Route Frequency Ordered Stop   04/10/22 2107  metroNIDAZOLE (FLAGYL) 500 MG/100ML IVPB       Note to Pharmacy: Enis Gash W: cabinet override      04/10/22 2107 04/10/22 2114   04/10/22 2103  ceFAZolin (ANCEF) 2-4 GM/100ML-% IVPB       Note to Pharmacy: Enis Gash W: cabinet override      04/10/22 2103 04/11/22 0914        Assessment/Plan  Colonic ileus with hemoperitoneum  S/P exploratory laparotomy, total abdominal colectomy, end ileostomy 04/10/22 - tolerated clamping and CLD yesterday, ok to advance to FLD today but would not advance past this given some distention still - ileostomy functioning, 225 out in last 24h, continue to monitor - continue to mobilize - ok for floor from a surgical perspective - WBC elevated at 22K from 15K, afebrile and abdominal exam benign - recheck in AM, if remains elevated will check UA/CXR +/- CT AP  FEN: FLD, decreased IVF to 50 cc/h VTE: heparin gtt ID: ancef/flagyl pre-op  - below per TRH -  Acute submassive PE/RLE DVT Hemorrhagic shock, ABL anemia - hgb 8.9 from 9 yesterday AM Acute respiratory failure with hypoxia Hypothyroidism HTN GERD Hyperglycemia  Obesity class I  LOS: 9 days     Norm Parcel, [facility] 04/17/2022, 9:27 AM Please see Amion for pager number during day hours 7:00am-4:30pm

## 2022-04-18 DIAGNOSIS — I2699 Other pulmonary embolism without acute cor pulmonale: Secondary | ICD-10-CM | POA: Diagnosis not present

## 2022-04-18 LAB — BASIC METABOLIC PANEL
Anion gap: 9 (ref 5–15)
BUN: 13 mg/dL (ref 8–23)
CO2: 23 mmol/L (ref 22–32)
Calcium: 7.9 mg/dL — ABNORMAL LOW (ref 8.9–10.3)
Chloride: 107 mmol/L (ref 98–111)
Creatinine, Ser: 0.95 mg/dL (ref 0.61–1.24)
GFR, Estimated: >60 mL/min (ref >60)
Glucose, Bld: 109 mg/dL — ABNORMAL HIGH (ref 70–99)
Potassium: 4.1 mmol/L (ref 3.5–5.1)
Sodium: 139 mmol/L (ref 135–145)

## 2022-04-18 LAB — CBC
HCT: 26.7 % — ABNORMAL LOW (ref 39.0–52.0)
Hemoglobin: 8.4 g/dL — ABNORMAL LOW (ref 13.0–17.0)
MCH: 29.5 pg (ref 26.0–34.0)
MCHC: 31.5 g/dL (ref 30.0–36.0)
MCV: 93.7 fL (ref 80.0–100.0)
Platelets: 262 10*3/uL (ref 150–400)
RBC: 2.85 MIL/uL — ABNORMAL LOW (ref 4.22–5.81)
RDW: 16.9 % — ABNORMAL HIGH (ref 11.5–15.5)
WBC: 23.1 10*3/uL — ABNORMAL HIGH (ref 4.0–10.5)
nRBC: 0.1 % (ref 0.0–0.2)

## 2022-04-18 LAB — GLUCOSE, CAPILLARY
Glucose-Capillary: 100 mg/dL — ABNORMAL HIGH (ref 70–99)
Glucose-Capillary: 102 mg/dL — ABNORMAL HIGH (ref 70–99)
Glucose-Capillary: 102 mg/dL — ABNORMAL HIGH (ref 70–99)
Glucose-Capillary: 103 mg/dL — ABNORMAL HIGH (ref 70–99)
Glucose-Capillary: 104 mg/dL — ABNORMAL HIGH (ref 70–99)
Glucose-Capillary: 107 mg/dL — ABNORMAL HIGH (ref 70–99)
Glucose-Capillary: 135 mg/dL — ABNORMAL HIGH (ref 70–99)

## 2022-04-18 LAB — HEPARIN LEVEL (UNFRACTIONATED): Heparin Unfractionated: 0.36 IU/mL (ref 0.30–0.70)

## 2022-04-18 NOTE — TOC Initial Note (Signed)
Transition of Care [facility]) - Initial/Assessment Note    Patient Details  Name: Kyle Delacruz MRN: [id number] Date of Birth: August 18, 1955  Transition of Care [facility]) CM/SW Contact:    Dessa Phi, RN Phone Number: 04/18/2022, 10:18 AM  Clinical Narrative: Per spouse no preference for [facility] agency-[place] HHRN-ostomy instruction/HHPT/OT rep Claiborne Billings following. Continue to monitor.                 Expected Discharge Plan: [place] Barriers to Discharge: Continued Medical Work up   Patient Goals and CMS Choice Patient states their goals for this hospitalization and ongoing recovery are::  (Home)   Choice offered to / list presented to : Spouse  Expected Discharge Plan and Services Expected Discharge Plan: [place]   Discharge Planning Services: CM Consult Post Acute Care Choice: [place] arrangements for the past 2 months: Single Family Home                           HH Arranged: RN, PT, OT HH Agency: [place] Date [facility] Agency Contacted: 04/18/22 Time [place]: 85 Representative spoke with at [place]:  Claiborne Billings)  Prior Living Arrangements/Services Living arrangements for the past 2 months: Single Family Home Lives with:: Spouse Patient language and need for interpreter reviewed:: Yes Do you feel safe going back to the place where you live?: Yes      Need for Family Participation in Patient Care: Yes (Comment) Care giver support system in place?: Yes (comment)   Criminal Activity/Legal Involvement Pertinent to Current Situation/Hospitalization: No - Comment as needed  Activities of Daily Living Home Assistive Devices/Equipment: Walker (specify type) ADL Screening (condition at time of admission) Patient's cognitive ability adequate to safely complete daily activities?: Yes Is the patient deaf or have difficulty hearing?: No Does the patient have difficulty seeing, even when wearing  glasses/contacts?: No Does the patient have difficulty concentrating, remembering, or making decisions?: No Patient able to express need for assistance with ADLs?: Yes Does the patient have difficulty dressing or bathing?: No Independently performs ADLs?: Yes (appropriate for developmental age) Does the patient have difficulty walking or climbing stairs?: Yes Weakness of Legs: Both Weakness of Arms/Hands: None  Permission Sought/Granted Permission sought to share information with : Case Manager Permission granted to share information with : Yes, Verbal Permission Granted  Share Information with NAME:  (Case manager)           Emotional Assessment Appearance:: Appears stated age Attitude/Demeanor/Rapport: Gracious          Admission diagnosis:  Ileus ([place]) [K56.7] PE (pulmonary thromboembolism) ([place]) [I26.99] Acute pulmonary embolism ([place]) [I26.99] AKI (acute kidney injury) ([place]) [N17.9] Deep vein thrombosis (DVT) of femoral vein of right lower extremity, unspecified chronicity ([place]) [I82.411] Patient Active Problem List   Diagnosis Date Noted   Normocytic anemia 04/10/2022   Shock ([place]) 04/10/2022   Deep vein thrombosis (DVT) of femoral vein of right lower extremity ([place]) 04/09/2022   Leg DVT (deep venous thromboembolism), chronic, right ([place]) 04/08/2022   Abdominal distension 04/08/2022   Acute respiratory failure with hypoxia ([place]) 07/27/2021   PE (pulmonary thromboembolism) ([place]) 07/26/2021   Carcinoma of prostate ([place]) 07/10/2021   Chronic obstructive pulmonary disease ([place]) 07/10/2021   HLD (hyperlipidemia) 03/10/2021   Hypothyroidism 03/10/2021   Back pain 03/10/2021   History of adenomatous polyps of colon 12/29/2019   Essential hypertension 06/13/2011   Obstructive sleep apnea (  adult) (pediatric) 06/13/2011   Gastro-esophageal reflux disease without esophagitis 05/02/2009   PCP:  Prince Solian, MD Pharmacy:   [id number] [place] [place]Phone: [phone]: [phone] [facility] [address] [place]Phone: [phone]: [phone]    Social Determinants of Health (SDOH) Interventions    Readmission Risk Interventions     No data to display

## 2022-04-18 NOTE — Progress Notes (Signed)
Progress Note  8 Days Post-Op  Subjective: Stooling.  Feels good.  No nausea/vomiting.    Objective: Vital signs in last 24 hours: Temp:  [97.9 F (36.6 C)-101.5 F (38.6 C)] 97.9 F (36.6 C) (12/06 0800) Pulse Rate:  [53-130] 102 (12/06 0500) Resp:  [18-28] 28 (12/05 1100) BP: (105-156)/(42-86) 113/56 (12/06 0500) SpO2:  [85 %-99 %] 92 % (12/06 0500) Weight:  [161 kg] 108 kg (12/06 0500) Last BM Date : 04/17/22  Intake/Output from previous day: 12/05 0701 - 12/06 0700 In: 2799.2 [P.O.:440; I.V.:2359.2] Out: 2025 [Urine:1175; Stool:850] Intake/Output this shift: No intake/output data recorded.  PE: Abd: soft, appropriately ttp, mild to mod distention, +BS, ileostomy viable with liquid output in bag, incision C/D/I with staples present   Lab Results:  Recent Labs    04/17/22 0246 04/18/22 0254  WBC 22.7* 23.1*  HGB 8.9* 8.4*  HCT 27.7* 26.7*  PLT 239 262    BMET Recent Labs    04/17/22 0246 04/18/22 0254  NA 140 139  K 3.4* 4.1  CL 106 107  CO2 24 23  GLUCOSE 104* 109*  BUN 8 13  CREATININE 0.89 0.95  CALCIUM 8.0* 7.9*    PT/INR No results for input(s): "LABPROT", "INR" in the last 72 hours. CMP     Component Value Date/Time   NA 139 04/18/2022 0254   K 4.1 04/18/2022 0254   CL 107 04/18/2022 0254   CO2 23 04/18/2022 0254   GLUCOSE 109 (H) 04/18/2022 0254   BUN 13 04/18/2022 0254   CREATININE 0.95 04/18/2022 0254   CALCIUM 7.9 (L) 04/18/2022 0254   PROT 6.3 (L) 07/27/2021 0556   ALBUMIN 2.9 (L) 07/27/2021 0556   AST 14 (L) 07/27/2021 0556   ALT 13 07/27/2021 0556   ALKPHOS 50 07/27/2021 0556   BILITOT 1.3 (H) 07/27/2021 0556   GFRNONAA >60 04/18/2022 0254   GFRAA >60 08/19/2019 1417   Lipase     Component Value Date/Time   LIPASE 42 04/08/2022 0818       Studies/Results: DG CHEST PORT 1 VIEW  Result Date: 04/17/2022 CLINICAL DATA:  Fever. EXAM: PORTABLE CHEST 1 VIEW COMPARISON:  AP chest 04/12/2022 in 04/10/2022; CT chest  04/08/2022 FINDINGS: Interval extubation and removal of enteric tube. Surgical clips overlie the right upper abdominal quadrant. Mildly decreased lung volumes. The lungs are clear. No pleural effusion or pneumothorax. Heart size is at the upper limits of normal. Mediastinal contours are within normal limits. Surgical clips overlie the left thyroid gland as seen on prior CT. Partial visualization of posterior right cervical spine laminoplasty hardware. IMPRESSION: Interval extubation and removal of enteric tube. No acute cardiopulmonary process. Electronically Signed   By: Yvonne Kendall M.D.   On: 04/17/2022 10:07   DG Abd Portable 2V  Result Date: 04/17/2022 CLINICAL DATA:  Fever EXAM: PORTABLE ABDOMEN - 2 VIEW COMPARISON:  Abdominal radiograph dated April 10, 2022 FINDINGS: Gaseous distention of multiple colonic loops suggesting ileus. No evidence of extraluminal free air. Surgical staples over the midabdomen and lumbar spine hardware is unchanged. Bilateral hip arthroplasty with intact hardware. IMPRESSION: Gaseous distention of multiple colonic loops suggesting ileus. Electronically Signed   By: Keane Police D.O.   On: 04/17/2022 09:57    Anti-infectives: Anti-infectives (From admission, onward)    Start     Dose/Rate Route Frequency Ordered Stop   04/10/22 2107  metroNIDAZOLE (FLAGYL) 500 MG/100ML IVPB       Note to Pharmacy: Barbaraann Faster: cabinet override  04/10/22 2107 04/10/22 2114   04/10/22 2103  ceFAZolin (ANCEF) 2-4 GM/100ML-% IVPB       Note to Pharmacy: Enis Gash W: cabinet override      04/10/22 2103 04/11/22 0914        Assessment/Plan  Colonic ileus with hemoperitoneum  S/P exploratory laparotomy, total abdominal colectomy, end ileostomy 04/10/22 - FLD - unsure cause of leukocytosis and fevers - wound appears healthy on exam, ID may order CT scan - ileostomy functioning - continue to mobilize  FEN: FLD VTE: heparin gtt ID: ancef/flagyl pre-op  -  below per TRH -  Acute submassive PE/RLE DVT Hemorrhagic shock, ABL anemia - hgb 8.9 from 9 yesterday AM Acute respiratory failure with hypoxia Hypothyroidism HTN GERD Hyperglycemia  Obesity class I  LOS: 10 days     Kyle Delacruz, [facility] 04/18/2022, 8:25 AM Please see Amion for pager number during day hours 7:00am-4:30pm

## 2022-04-18 NOTE — Progress Notes (Signed)
[facility] for Infectious Disease    Date of Admission:  04/08/2022   Total days of antibiotics           ID: Kyle Delacruz is a 66 y.o. male with  Principal Problem:   PE (pulmonary thromboembolism) ([place]) Active Problems:   Hypothyroidism   Essential hypertension   Gastro-esophageal reflux disease without esophagitis   Obstructive sleep apnea (adult) (pediatric)   Acute respiratory failure with hypoxia (HCC)   Leg DVT (deep venous thromboembolism), chronic, right (HCC)   Abdominal distension   Deep vein thrombosis (DVT) of femoral vein of right lower extremity (HCC)   Normocytic anemia   Shock (HCC)    Subjective: Afebrile. This morning, feeling better but still having abdominal distention.  Medications:   acetaminophen  650 mg Oral Q6H   amLODipine  10 mg Oral Daily   Chlorhexidine Gluconate Cloth  6 each Topical Daily   insulin aspart  0-9 Units Subcutaneous Q4H   [START ON 04/22/2022] levothyroxine  25 mcg Intravenous Daily   metoprolol tartrate  25 mg Oral BID   multivitamin with minerals  1 tablet Oral Daily   pantoprazole (PROTONIX) IV  40 mg Intravenous Daily   Ensure Max Protein  11 oz Oral BID    Objective: Vital signs in last 24 hours: Temp:  [97.8 F (36.6 C)-99.1 F (37.3 C)] 98 F (36.7 C) (12/06 1319) Pulse Rate:  [53-104] 99 (12/06 1319) Resp:  [20] 20 (12/06 1319) BP: (105-139)/(42-68) 139/66 (12/06 1319) SpO2:  [87 %-99 %] 96 % (12/06 1319) Weight:  [315 kg] 108 kg (12/06 0500)  Physical Exam  Constitutional: He is oriented to person, place, and time. He appears well-developed and well-nourished. No distress. Sitting up in chair HENT:  Mouth/Throat: Oropharynx is clear and moist. No oropharyngeal exudate.  Cardiovascular: Normal rate, regular rhythm and normal heart sounds. Exam reveals no gallop and no friction rub.  No murmur heard.  Pulmonary/Chest: Effort normal and breath sounds normal. No respiratory distress. He has no  wheezes.  Abdominal: Soft. Bowel sounds are normal. Mild distension. + gas and fluid in ostomy. There is no tenderness.  Lymphadenopathy:  He has no cervical adenopathy.  Neurological: He is alert and oriented to person, place, and time.  Skin: Skin is warm and dry. No rash noted. No erythema.  Psychiatric: He has a normal mood and affect. His behavior is normal.    Lab Results Recent Labs    04/17/22 0246 04/18/22 0254  WBC 22.7* 23.1*  HGB 8.9* 8.4*  HCT 27.7* 26.7*  NA 140 139  K 3.4* 4.1  CL 106 107  CO2 24 23  BUN 8 13  CREATININE 0.89 0.95     Microbiology: Urine GNR 100,000 CFU Blood cx NGTD Studies/Results: DG CHEST PORT 1 VIEW  Result Date: 04/17/2022 CLINICAL DATA:  Fever. EXAM: PORTABLE CHEST 1 VIEW COMPARISON:  AP chest 04/12/2022 in 04/10/2022; CT chest 04/08/2022 FINDINGS: Interval extubation and removal of enteric tube. Surgical clips overlie the right upper abdominal quadrant. Mildly decreased lung volumes. The lungs are clear. No pleural effusion or pneumothorax. Heart size is at the upper limits of normal. Mediastinal contours are within normal limits. Surgical clips overlie the left thyroid gland as seen on prior CT. Partial visualization of posterior right cervical spine laminoplasty hardware. IMPRESSION: Interval extubation and removal of enteric tube. No acute cardiopulmonary process. Electronically Signed   By: Yvonne Kendall M.D.   On: 04/17/2022 10:07   DG  Abd Portable 2V  Result Date: 04/17/2022 CLINICAL DATA:  Fever EXAM: PORTABLE ABDOMEN - 2 VIEW COMPARISON:  Abdominal radiograph dated April 10, 2022 FINDINGS: Gaseous distention of multiple colonic loops suggesting ileus. No evidence of extraluminal free air. Surgical staples over the midabdomen and lumbar spine hardware is unchanged. Bilateral hip arthroplasty with intact hardware. IMPRESSION: Gaseous distention of multiple colonic loops suggesting ileus. Electronically Signed   By: Keane Police D.O.    On: 04/17/2022 09:57     Assessment/Plan:  Fever = appears resolved. Infectious work up showing GNR on urine culture but he doesn't complain of dysuria. I am not inclined to start abtx since he appears better. Will defer doing abdominal CT at this time.   Abdominal and lumbar surgical incision do not appear infected  [facility] for Infectious Diseases Pager: [phone]  04/18/2022, 5:04 PM

## 2022-04-18 NOTE — Progress Notes (Signed)
ANTICOAGULATION CONSULT NOTE - Follow Up Consult  Pharmacy Consult for Heparin Indication: pulmonary embolus and DVT  Allergies  Allergen Reactions   Bee Venom     Other reaction(s): Unknown   Lisinopril     Other reaction(s): Unknown   Metoclopramide Other (See Comments)    Other reaction(s): shaking too bad "Sweat like crazy"    Penicillins Hives    Did it involve swelling of the face/tongue/throat, SOB, or low BP? Yes Did it involve sudden or severe rash/hives, skin peeling, or any reaction on the inside of your mouth or nose? Yes Did you need to seek medical attention at a hospital or doctor's office? No When did it last happen?  childhood      If all above answers are "NO", may proceed with cephalosporin use.     Shellfish Allergy Swelling    Patient Measurements: Height: '5\' 10"'$  (177.8 cm) Weight: 108 kg (238 lb 1.6 oz) IBW/kg (Calculated) : 73 Heparin Dosing Weight: 94 kg  Vital Signs: Temp: 99.1 F (37.3 C) (12/06 0400) Temp Source: Oral (12/06 0400) BP: 113/56 (12/06 0500) Pulse Rate: 102 (12/06 0500)  Labs: Recent Labs    04/16/22 0534 04/16/22 1007 04/17/22 0246 04/17/22 1247 04/17/22 1848 04/18/22 0254  HGB 9.0*  9.1* 9.5* 8.9*  --   --  8.4*  HCT 28.0*  28.4* 29.1* 27.7*  --   --  26.7*  PLT 218  --  239  --   --  262  HEPARINUNFRC 0.34  --  0.29* 0.43 0.53 0.36  CREATININE 0.92  --  0.89  --   --  0.95     Estimated Creatinine Clearance: 96.7 mL/min (by C-G formula based on SCr of 0.95 mg/dL).   Assessment: 62 yoM presented on 11/26 for cough, SOB, abdominal pain.  PMH significant for DVT/PE after previous cervical spine surgery 07/17/21 and he completed a course of apixaban in August.  He had a recent L2-L5 fusion on 04/04/22 and had a postop plan to be on Lovenox '40mg'$  (filled 04/04/22 x7 days).  On admission, CTa is positive for acute PE with right heart strain and postop ileus.  Dopplers are positive for DVT in RLE.   Significant  events: 11/28 Heparin stopped for hemoperitoneum, colonic ileus.  OR for exp lap. TAC, end ileostomy.  Intra-op source of bleeding found to be mesentery of hepatic flexure.  Massive transfusion protocol & 1 dose of TXA. 11/29 heparin resumed with no bolus per CCS & CCM    Today, 04/18/2022: Heparin levels continues to be therapeutic on current IV heparin rate of 2150 units/hr CBC: Hgb 8.4 (remains low/stable), Plt WNL No bleeding or complications noted   Goal of Therapy:  Heparin level 0.3-0.7 units/ml Monitor platelets by anticoagulation protocol: Yes   Plan:  Continue heparin IV infusion at 2150 units/hr Monitor daily heparin level and CBC, s/s bleeding.    Adrian Saran, PharmD, BCPS Secure Chat if ?s 04/18/2022 8:11 AM

## 2022-04-18 NOTE — Progress Notes (Signed)
Occupational Therapy Treatment Patient Details Name: Kyle Delacruz MRN: [id number] DOB: Aug 20, 1955 Today's Date: 04/18/2022   History of present illness Pt is a 66yo male presenting to [facility] on 11/26 with worsening SOB and RLE swelling with revealed PE, also constipation. Pt is s/p L2-L5 decompression & fusion 11/22. Underwent emergency exploratory laparatomy with total colectomy and end ileostomy on 11/28, also intubated 11/28. Extubated 11/30.  PMH: DM, hx of DVT 07/2021, GERD, gout, HLD, hypothyroidism, OSA, OSA non-compliant with CPAP, b/l TKA R-1997 L-2001, b/l THA R-2006 L-2009.   OT comments  Patient min assist for supine to sit today and min guard to stand. He performed oral care in standing by bed to work on activity tolerance. He needed intermittent hand hold to steady himself but at times but at times using both hands to perform task. He needed to sit before transferring to recliner. O2 sat was [facility] on RA and HR up to 132 with standing task. Patient min guard to take steps to recliner with walker. He is shaky - mostly his hands - but no physical assistance required. Continue to recommend [facility] services.    Recommendations for follow up therapy are one component of a multi-disciplinary discharge planning process, led by the attending physician.  Recommendations may be updated based on patient status, additional functional criteria and insurance authorization.    Follow Up Recommendations  Home health OT     Assistance Recommended at Discharge Frequent or constant Supervision/Assistance  Patient can return home with the following  A little help with walking and/or transfers;A lot of help with bathing/dressing/bathroom;Assistance with cooking/housework;Help with stairs or ramp for entrance   Equipment Recommendations  None recommended by OT    Recommendations for Other Services      Precautions / Restrictions Precautions Precautions: Fall;Back;Other (comment) Restrictions Other  Position/Activity Restrictions: wbat. Unable to wear spinal brace due to new abdominal incision and colostomy       Mobility Bed Mobility                    Transfers                         Balance Overall balance assessment: No apparent balance deficits (not formally assessed) Sitting-balance support: No upper extremity supported, Feet supported Sitting balance-Leahy Scale: Good     Standing balance support: Single extremity supported Standing balance-Leahy Scale: Fair Standing balance comment: Intermittent use of one hand to stabilize himself                           ADL either performed or assessed with clinical judgement   ADL Overall ADL's : Needs assistance/impaired     Grooming: Standing;Oral care Grooming Details (indicate cue type and reason): Stood beside bed and performed oral care (set up on bedside table) x 2 minutes                               General ADL Comments: Patient min assist to transfer into sitting needing a hand hold to pull up on and able to maintain back precautions. Min guard to stand for grooming tasks and transfer to recliner with walker. Patient shaky - especially in hands but no physical assistance required.    Extremity/Trunk Assessment Upper Extremity Assessment Upper Extremity Assessment: Overall WFL for tasks assessed   Lower Extremity Assessment Lower  Extremity Assessment: Defer to PT evaluation   Cervical / Trunk Assessment Cervical / Trunk Assessment: Back Surgery;Other exceptions Cervical / Trunk Exceptions: abdominal surgery    Vision Patient Visual Report: No change from baseline     Perception     Praxis      Cognition Arousal/Alertness: Awake/alert Behavior During Therapy: WFL for tasks assessed/performed Overall Cognitive Status: Within Functional Limits for tasks assessed                                          Exercises      Shoulder Instructions        General Comments      Pertinent Vitals/ Pain       Pain Assessment Pain Assessment: 0-10 Pain Score: 4  Pain Location: abdomen, low back Pain Descriptors / Indicators: Sore, Grimacing Pain Intervention(s): Monitored during session  Home Living                                          Prior Functioning/Environment              Frequency  Min 2X/week        Progress Toward Goals  OT Goals(current goals can now be found in the care plan section)  Progress towards OT goals: Progressing toward goals  Acute Rehab OT Goals Patient Stated Goal: go hoem at discharge OT Goal Formulation: With patient/family Time For Goal Achievement: 04/27/22 Potential to Achieve Goals: Good  Plan Discharge plan remains appropriate    Co-evaluation          OT goals addressed during session: ADL's and self-care (activity tolerance)      AM-PAC OT "6 Clicks" Daily Activity     Outcome Measure   Help from another person eating meals?: A Little Help from another person taking care of personal grooming?: A Little Help from another person toileting, which includes using toliet, bedpan, or urinal?: Total Help from another person bathing (including washing, rinsing, drying)?: A Lot Help from another person to put on and taking off regular upper body clothing?: A Little Help from another person to put on and taking off regular lower body clothing?: Total 6 Click Score: 13    End of Session Equipment Utilized During Treatment: Rolling walker (2 wheels)  OT Visit Diagnosis: Pain   Activity Tolerance Patient tolerated treatment well   Patient Left in chair;with call bell/phone within reach;with family/visitor present   Nurse Communication Mobility status        Time: 9024-0973 OT Time Calculation (min): 25 min  Charges: OT General Charges $OT Visit: 1 Visit OT Treatments $Self Care/Home Management : 8-22 mins $Therapeutic Activity: 8-22  mins  Gustavo Lah, OTR/L Acute Care Rehab Services  Office [phone]   Lenward Chancellor 04/18/2022, 11:09 AM

## 2022-04-18 NOTE — Progress Notes (Signed)
Physical Therapy Treatment Patient Details Name: Kyle Delacruz MRN: [id number] DOB: 04/24/1956 Today's Date: 04/18/2022   History of Present Illness Pt is a 66yo male presenting to [facility] on 11/26 with worsening SOB and RLE swelling with revealed PE, also constipation. Pt is s/p L2-L5 decompression & fusion 11/22. Underwent emergency exploratory laparatomy with total colectomy and end ileostomy on 11/28, also intubated 11/28. Extubated 11/30.  PMH: DM, hx of DVT 07/2021, GERD, gout, HLD, hypothyroidism, OSA, OSA non-compliant with CPAP, b/l TKA R-1997 L-2001, b/l THA R-2006 L-2009.    PT Comments    Pt received in recliner in good spirits with pain report of 4/10, wife present in the room. Reviewed back precautions and pt was able to recall 75% of precautions and required cues for "no bending." Required min guard +2 for transfers and ambulation in hallway with RW 71f with two standing rest breaks.  Pt required min assist for bed mobility with review of logroll technique as pt could not recall; repositioned in supine for comfort with RUE supported on pillows secondary to edema. Further mobility deferred as pt fatigued post-ambulation. Pt would benefit from increased opportunity to ambulate, encouraged pt to ask RN for additional attempts outside of therapy sessions, RN notified. Pt is progressing well. We will continue to follow acutely.  Vitals at start of session: 115/63, Hr95, SpO2 94% on RA Vitals end of session: 131/73, HR98, SpO2 93% on RA   Recommendations for follow up therapy are one component of a multi-disciplinary discharge planning process, led by the attending physician.  Recommendations may be updated based on patient status, additional functional criteria and insurance authorization.  Follow Up Recommendations  Home health PT     Assistance Recommended at Discharge Frequent or constant Supervision/Assistance  Patient can return home with the following A little help with walking  and/or transfers;A little help with bathing/dressing/bathroom;Assistance with cooking/housework;Assist for transportation;Help with stairs or ramp for entrance   Equipment Recommendations  None recommended by PT (Pt has recommended DME)    Recommendations for Other Services       Precautions / Restrictions Precautions Precautions: Fall;Back;Other (comment) Precaution Booklet Issued: No Precaution Comments: Reviewed verbally Restrictions Weight Bearing Restrictions: No Other Position/Activity Restrictions: wbat. Unable to wear spinal brace due to new abdominal incision and colostomy     Mobility  Bed Mobility Overal bed mobility: Needs Assistance Bed Mobility: Rolling, Sit to Sidelying Rolling: Min assist       Sit to sidelying: Min assist General bed mobility comments: Min assist for rolling and sit to sidelying with additional education on logrolling technique as pt unable to recall technique.    Transfers Overall transfer level: Needs assistance Equipment used: Rolling walker (2 wheels) Transfers: Sit to/from Stand, Bed to chair/wheelchair/BSC Sit to Stand: Min guard, +2 safety/equipment           General transfer comment: Sit to stand: pt min guard for safety, +2 for management of lines only.    Ambulation/Gait Ambulation/Gait assistance: Min guard, +2 safety/equipment Gait Distance (Feet): 82 Feet Assistive device: Rolling walker (2 wheels) Gait Pattern/deviations: Step-through pattern, Trunk flexed Gait velocity: decreased     General Gait Details: Pt ambulated with RW and min guard +2 for recliner follow, no physical assist required or overt LOB noted. Pt with trunk flexed and moderate use of BUE on RW; required two standing rest breaks. HR high 123 SpO2>90%.   Stairs             WEmergency planning/management officer  Modified Rankin (Stroke Patients Only)       Balance Overall balance assessment: No apparent balance deficits (not formally  assessed) Sitting-balance support: No upper extremity supported, Feet supported Sitting balance-Leahy Scale: Good     Standing balance support: Single extremity supported Standing balance-Leahy Scale: Fair                              Cognition Arousal/Alertness: Awake/alert Behavior During Therapy: WFL for tasks assessed/performed Overall Cognitive Status: Within Functional Limits for tasks assessed                                          Exercises      General Comments        Pertinent Vitals/Pain Pain Assessment Pain Assessment: 0-10 Pain Score: 4  Pain Location: abdomen, low back Pain Descriptors / Indicators: Sore, Grimacing Pain Intervention(s): Limited activity within patient's tolerance, Repositioned, Monitored during session    Home Living                          Prior Function            PT Goals (current goals can now be found in the care plan section) Acute Rehab PT Goals Patient Stated Goal: Go home PT Goal Formulation: With patient/family Time For Goal Achievement: 04/27/22 Potential to Achieve Goals: Good Progress towards PT goals: Progressing toward goals    Frequency    Min 3X/week      PT Plan Current plan remains appropriate    Co-evaluation       OT goals addressed during session: ADL's and self-care (activity tolerance)      AM-PAC PT "6 Clicks" Mobility   Outcome Measure  Help needed turning from your back to your side while in a flat bed without using bedrails?: A Little Help needed moving from lying on your back to sitting on the side of a flat bed without using bedrails?: A Little Help needed moving to and from a bed to a chair (including a wheelchair)?: A Little Help needed standing up from a chair using your arms (e.g., wheelchair or bedside chair)?: A Little Help needed to walk in hospital room?: A Little Help needed climbing 3-5 steps with a railing? : A Lot 6 Click Score:  17    End of Session Equipment Utilized During Treatment: Gait belt Activity Tolerance: Patient tolerated treatment well;No increased pain Patient left: with call bell/phone within reach;with family/visitor present;in bed;with bed alarm set Nurse Communication: Mobility status PT Visit Diagnosis: Pain;Difficulty in walking, not elsewhere classified (R26.2) Pain - part of body:  (abdomen, back)     Time: 1324-4010 PT Time Calculation (min) (ACUTE ONLY): 23 min  Charges:  $Gait Training: 8-22 mins                     Coolidge Breeze, PT, DPT [facility] Office: [phone] Weekend pager: [phone]   Coolidge Breeze 04/18/2022, 1:21 PM

## 2022-04-18 NOTE — Progress Notes (Signed)
PROGRESS NOTE    Kyle Delacruz  FIE:[id number] DOB: [birth year] DOA: 04/08/2022 PCP: Prince Solian, MD    Brief Narrative:  Patient is a 66 year old male with provoked DVT who presented after lumbar surgery with worsening shortness of breath and RLE swelling. 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  Echo/CTA showing some RV strain. Hospital course complicated by severe constipation vs. colonic ileus partially relieved with enema 04/10/22.  He developed searing epigastric pain with radiation to shoulder, and worsened with movement after enema. He subsequently developed worsening hypoxia, tachycardia and hypotension requiring intubation on 11/28.  CT results showed a large pericolonic hematoma tracking into the peritoneum and right hemiliver.  Mass transfusion protocol was activated and he was evaluated by surgery and underwent emergency ex lap with total colectomy and end ileostomy on 04/10/2022.  Started on IV heparin on 11/29 He was extubated on 11/30 and weaned off of vasopressors on 12/1     Assessment & Plan:   Acute submassive pulmonary embolism with hemorrhagic shock: Right lower extremity DVT: Remains on heparin, will change to oral anticoagulation once all surgical procedures are completed and medically stable.  Continue heparin today.  Acute respiratory failure with hypoxemia: Extubated on 11/30.  Remains on minimal oxygen.  Mobility and chest physiotherapy.  Post colonic ileus, acute blood loss anemia with intra-abdominal hematoma: Status post ex lap. Ex lap, total colectomy and end ileostomy 11/28.  Night spiking low-grade fever with leukocytosis.  Followed by ID Blood cultures, negative so far. Urinalysis, negative. Procalcitonin,1.3 Does have leukocytosis, however monitoring without antibiotics today.  ID deciding about repeat CT scans today.  Hypothyroidism: On IV Synthroid.  Essential hypertension: Blood pressure stable on Norvasc and metoprolol.  GERD: PPI.  Continue  to work with PT OT.  Patient can transfer to progressive bed.  DVT prophylaxis: Place TED hose Start: 04/09/22 0021   Code Status: Full code Family Communication: None at the bedside Disposition Plan: Status is: Inpatient Remains inpatient appropriate because: IV heparin, active treatment plans     Consultants:  Critical care General surgery Infectious disease  Procedures:  Multiple procedures as above  Antimicrobials:  Currently without antibiotics   Subjective: Examined patient in the morning rounds.  Denied any complaints.  He tells me his pain in the abdomen is about 3/10.  He is burping after taking some ice chips.  Belly is bloated but not very painful as per the patient.  Minimal liquid on the ileostomy bag.  Afebrile overnight.  Objective: Vitals:   04/18/22 0215 04/18/22 0300 04/18/22 0400 04/18/22 0500  BP:  111/61 133/61 (!) 113/56  Pulse: (!) 104 (!) 103 (!) 103 (!) 102  Resp:      Temp:   99.1 F (37.3 C)   TempSrc:   Oral   SpO2: (!) 87% 93% 93% 92%  Weight:    108 kg  Height:        Intake/Output Summary (Last 24 hours) at 04/18/2022 0738 Last data filed at 04/18/2022 0500 Gross per 24 hour  Intake 2799.21 ml  Output 2025 ml  Net 774.21 ml   Filed Weights   04/15/22 0434 04/17/22 0459 04/18/22 0500  Weight: 110.8 kg 108.5 kg 108 kg    Examination:  General exam: Appears calm and comfortable  Frail looking. Respiratory system: Clear to auscultation. Respiratory effort normal.  No added sounds. Cardiovascular system: S1 & S2 heard, RRR.  No pedal edema. Gastrointestinal system: Soft.  Distended.  Mild diffuse tenderness.  Bowel sounds sluggish.  Right lower  quadrant ileostomy bag with some liquid secretions.   Central nervous system: Alert and oriented. No focal neurological deficits. Extremities: Symmetric 5 x 5 power. Skin: No rashes, lesions or ulcers Psychiatry: Judgement and insight appear normal. Mood & affect appropriate.     Data  Reviewed: I have personally reviewed following labs and imaging studies  CBC: Recent Labs  Lab 04/14/22 0236 04/14/22 0900 04/15/22 0410 04/15/22 1522 04/15/22 2150 04/16/22 0534 04/16/22 1007 04/17/22 0246 04/18/22 0254  WBC 11.7*  --  12.8*  --   --  15.1*  --  22.7* 23.1*  HGB 8.8*   < > 8.4*   < > 10.1* 9.0*  9.1* 9.5* 8.9* 8.4*  HCT 26.7*   < > 26.1*   < > 32.3* 28.0*  28.4* 29.1* 27.7* 26.7*  MCV 90.5  --  91.9  --   --  91.3  --  90.8 93.7  PLT 145*  --  159  --   --  218  --  239 262   < > = values in this interval not displayed.   Basic Metabolic Panel: Recent Labs  Lab 04/13/22 0845 04/14/22 0236 04/15/22 0410 04/16/22 0534 04/17/22 0246 04/18/22 0254  NA 128* 139 141 139 140 139  K 3.6 3.5 3.1* 3.4* 3.4* 4.1  CL 93* 104 106 106 106 107  CO2 '29 24 24 23 24 23  '$ GLUCOSE 115* 113* 112* 113* 104* 109*  BUN '12 9 9 9 8 13  '$ CREATININE 0.90 1.01 0.93 0.92 0.89 0.95  CALCIUM 7.5* 8.1* 8.0* 8.2* 8.0* 7.9*  MG 1.9  --   --   --   --   --   PHOS  --  3.7 3.0 3.0  --   --    GFR: Estimated Creatinine Clearance: 96.7 mL/min (by C-G formula based on SCr of 0.95 mg/dL). Liver Function Tests: No results for input(s): "AST", "ALT", "ALKPHOS", "BILITOT", "PROT", "ALBUMIN" in the last 168 hours. No results for input(s): "LIPASE", "AMYLASE" in the last 168 hours. No results for input(s): "AMMONIA" in the last 168 hours. Coagulation Profile: No results for input(s): "INR", "PROTIME" in the last 168 hours. Cardiac Enzymes: No results for input(s): "CKTOTAL", "CKMB", "CKMBINDEX", "TROPONINI" in the last 168 hours. BNP (last 3 results) No results for input(s): "PROBNP" in the last 8760 hours. HbA1C: No results for input(s): "HGBA1C" in the last 72 hours. CBG: Recent Labs  Lab 04/17/22 1230 04/17/22 1745 04/17/22 2009 04/17/22 2342 04/18/22 0400  GLUCAP 116* 135* 117* 108* 107*   Lipid Profile: No results for input(s): "CHOL", "HDL", "LDLCALC", "TRIG", "CHOLHDL",  "LDLDIRECT" in the last 72 hours. Thyroid Function Tests: No results for input(s): "TSH", "T4TOTAL", "FREET4", "T3FREE", "THYROIDAB" in the last 72 hours. Anemia Panel: No results for input(s): "VITAMINB12", "FOLATE", "FERRITIN", "TIBC", "IRON", "RETICCTPCT" in the last 72 hours. Sepsis Labs: Recent Labs  Lab 04/13/22 0845 04/16/22 0534 04/17/22 0943  PROCALCITON  --  0.62 1.30  LATICACIDVEN 1.0  --   --     Recent Results (from the past 240 hour(s))  Resp Panel by RT-PCR (Flu A&B, Covid) Anterior Nasal Swab     Status: None   Collection Time: 04/08/22  8:18 AM   Specimen: Anterior Nasal Swab  Result Value Ref Range Status   SARS Coronavirus 2 by RT PCR NEGATIVE NEGATIVE Final    Comment: (NOTE) SARS-CoV-2 target nucleic acids are NOT DETECTED.  The SARS-CoV-2 RNA is generally detectable in upper respiratory specimens during the  acute phase of infection. The lowest concentration of SARS-CoV-2 viral copies this assay can detect is 138 copies/mL. A negative result does not preclude SARS-Cov-2 infection and should not be used as the sole basis for treatment or other patient management decisions. A negative result may occur with  improper specimen collection/handling, submission of specimen other than nasopharyngeal swab, presence of viral mutation(s) within the areas targeted by this assay, and inadequate number of viral copies(<138 copies/mL). A negative result must be combined with clinical observations, patient history, and epidemiological information. The expected result is Negative.  Fact Sheet for Patients:  EntrepreneurPulse.com.au  Fact Sheet for Healthcare Providers:  IncredibleEmployment.be  This test is no t yet approved or cleared by the [place] FDA and  has been authorized for detection and/or diagnosis of SARS-CoV-2 by FDA under an Emergency Use Authorization (EUA). This EUA will remain  in effect (meaning this test can  be used) for the duration of the COVID-19 declaration under Section 564(b)(1) of the Act, 21 U.S.C.section 360bbb-3(b)(1), unless the authorization is terminated  or revoked sooner.       Influenza A by PCR NEGATIVE NEGATIVE Final   Influenza B by PCR NEGATIVE NEGATIVE Final    Comment: (NOTE) The Xpert Xpress SARS-CoV-2/FLU/RSV plus assay is intended as an aid in the diagnosis of influenza from Nasopharyngeal swab specimens and should not be used as a sole basis for treatment. Nasal washings and aspirates are unacceptable for Xpert Xpress SARS-CoV-2/FLU/RSV testing.  Fact Sheet for Patients: EntrepreneurPulse.com.au  Fact Sheet for Healthcare Providers: IncredibleEmployment.be  This test is not yet approved or cleared by the [place] FDA and has been authorized for detection and/or diagnosis of SARS-CoV-2 by FDA under an Emergency Use Authorization (EUA). This EUA will remain in effect (meaning this test can be used) for the duration of the COVID-19 declaration under Section 564(b)(1) of the Act, 21 U.S.C. section 360bbb-3(b)(1), unless the authorization is terminated or revoked.  Performed at [facility], [address] [place], [place] [address]   MRSA Next Gen by PCR, Nasal     Status: None   Collection Time: 04/08/22  6:26 PM   Specimen: Nasal Mucosa; Nasal Swab  Result Value Ref Range Status   MRSA by PCR Next Gen NOT DETECTED NOT DETECTED Final    Comment: (NOTE) The GeneXpert MRSA Assay (FDA approved for NASAL specimens only), is one component of a comprehensive MRSA colonization surveillance program. It is not intended to diagnose MRSA infection nor to guide or monitor treatment for MRSA infections. Test performance is not FDA approved in patients less than 52 years old. Performed at [facility], [place] [address] [place],  [address]          Radiology Studies: DG  CHEST PORT 1 VIEW  Result Date: 04/17/2022 CLINICAL DATA:  Fever. EXAM: PORTABLE CHEST 1 VIEW COMPARISON:  AP chest 04/12/2022 in 04/10/2022; CT chest 04/08/2022 FINDINGS: Interval extubation and removal of enteric tube. Surgical clips overlie the right upper abdominal quadrant. Mildly decreased lung volumes. The lungs are clear. No pleural effusion or pneumothorax. Heart size is at the upper limits of normal. Mediastinal contours are within normal limits. Surgical clips overlie the left thyroid gland as seen on prior CT. Partial visualization of posterior right cervical spine laminoplasty hardware. IMPRESSION: Interval extubation and removal of enteric tube. No acute cardiopulmonary process. Electronically Signed   By: Yvonne Kendall M.D.   On: 04/17/2022 10:07   DG Abd Portable 2V  Result Date: 04/17/2022 CLINICAL DATA:  Fever EXAM: PORTABLE ABDOMEN - 2 VIEW COMPARISON:  Abdominal radiograph dated April 10, 2022 FINDINGS: Gaseous distention of multiple colonic loops suggesting ileus. No evidence of extraluminal free air. Surgical staples over the midabdomen and lumbar spine hardware is unchanged. Bilateral hip arthroplasty with intact hardware. IMPRESSION: Gaseous distention of multiple colonic loops suggesting ileus. Electronically Signed   By: Keane Police D.O.   On: 04/17/2022 09:57        Scheduled Meds:  acetaminophen  650 mg Oral Q6H   amLODipine  10 mg Oral Daily   Chlorhexidine Gluconate Cloth  6 each Topical Daily   insulin aspart  0-9 Units Subcutaneous Q4H   [START ON 04/22/2022] levothyroxine  25 mcg Intravenous Daily   metoprolol tartrate  25 mg Oral BID   multivitamin with minerals  1 tablet Oral Daily   pantoprazole (PROTONIX) IV  40 mg Intravenous Daily   Ensure Max Protein  11 oz Oral BID   Continuous Infusions:  sodium chloride Stopped (04/15/22 1829)   0.9 % NaCl with KCl 20 mEq / L 100 mL/hr at 04/18/22 0400   heparin 2,150 Units/hr (04/18/22 0400)     LOS: 10  days    Time spent: 35 minutes    Barb Merino, MD [facility] Pager [phone]

## 2022-04-19 DIAGNOSIS — I2699 Other pulmonary embolism without acute cor pulmonale: Secondary | ICD-10-CM | POA: Diagnosis not present

## 2022-04-19 LAB — CBC WITH DIFFERENTIAL/PLATELET
Abs Immature Granulocytes: 0.22 10*3/uL — ABNORMAL HIGH (ref 0.00–0.07)
Basophils Absolute: 0 10*3/uL (ref 0.0–0.1)
Basophils Relative: 0 %
Eosinophils Absolute: 0.3 10*3/uL (ref 0.0–0.5)
Eosinophils Relative: 1 %
HCT: 27.5 % — ABNORMAL LOW (ref 39.0–52.0)
Hemoglobin: 8.5 g/dL — ABNORMAL LOW (ref 13.0–17.0)
Immature Granulocytes: 1 %
Lymphocytes Relative: 9 %
Lymphs Abs: 1.9 10*3/uL (ref 0.7–4.0)
MCH: 29 pg (ref 26.0–34.0)
MCHC: 30.9 g/dL (ref 30.0–36.0)
MCV: 93.9 fL (ref 80.0–100.0)
Monocytes Absolute: 1.5 10*3/uL — ABNORMAL HIGH (ref 0.1–1.0)
Monocytes Relative: 7 %
Neutro Abs: 17.6 10*3/uL — ABNORMAL HIGH (ref 1.7–7.7)
Neutrophils Relative %: 82 %
Platelets: 312 10*3/uL (ref 150–400)
RBC: 2.93 MIL/uL — ABNORMAL LOW (ref 4.22–5.81)
RDW: 17 % — ABNORMAL HIGH (ref 11.5–15.5)
WBC: 21.5 10*3/uL — ABNORMAL HIGH (ref 4.0–10.5)
nRBC: 0.1 % (ref 0.0–0.2)

## 2022-04-19 LAB — URINE CULTURE: Culture: >=100000 — AB

## 2022-04-19 LAB — BASIC METABOLIC PANEL
Anion gap: 13 (ref 5–15)
BUN: 15 mg/dL (ref 8–23)
CO2: 21 mmol/L — ABNORMAL LOW (ref 22–32)
Calcium: 8 mg/dL — ABNORMAL LOW (ref 8.9–10.3)
Chloride: 107 mmol/L (ref 98–111)
Creatinine, Ser: 0.91 mg/dL (ref 0.61–1.24)
GFR, Estimated: >60 mL/min (ref >60)
Glucose, Bld: 114 mg/dL — ABNORMAL HIGH (ref 70–99)
Potassium: 4.1 mmol/L (ref 3.5–5.1)
Sodium: 141 mmol/L (ref 135–145)

## 2022-04-19 LAB — GLUCOSE, CAPILLARY
Glucose-Capillary: 105 mg/dL — ABNORMAL HIGH (ref 70–99)
Glucose-Capillary: 103 mg/dL — ABNORMAL HIGH (ref 70–99)
Glucose-Capillary: 113 mg/dL — ABNORMAL HIGH (ref 70–99)
Glucose-Capillary: 105 mg/dL — ABNORMAL HIGH (ref 70–99)
Glucose-Capillary: 107 mg/dL — ABNORMAL HIGH (ref 70–99)

## 2022-04-19 LAB — PHOSPHORUS: Phosphorus: 2.6 mg/dL (ref 2.5–4.6)

## 2022-04-19 LAB — MAGNESIUM: Magnesium: 1.8 mg/dL (ref 1.7–2.4)

## 2022-04-19 LAB — HEPARIN LEVEL (UNFRACTIONATED): Heparin Unfractionated: 0.5 IU/mL (ref 0.30–0.70)

## 2022-04-19 MED ORDER — APIXABAN 5 MG PO TABS
5.0000 mg | ORAL_TABLET | Freq: Two times a day (BID) | ORAL | Status: DC
  Administered 2022-04-19 – 2022-04-22 (×7): 5 mg via ORAL
  Filled 2022-04-19 (×7): qty 1

## 2022-04-19 MED ORDER — LEVOTHYROXINE SODIUM 50 MCG PO TABS
50.0000 ug | ORAL_TABLET | Freq: Every day | ORAL | Status: DC
  Administered 2022-04-19 – 2022-04-22 (×4): 50 ug via ORAL
  Filled 2022-04-19 (×4): qty 1

## 2022-04-19 MED ORDER — PANTOPRAZOLE SODIUM 40 MG PO TBEC
40.0000 mg | DELAYED_RELEASE_TABLET | Freq: Every day | ORAL | Status: DC
  Administered 2022-04-20 – 2022-04-22 (×3): 40 mg via ORAL
  Filled 2022-04-19 (×3): qty 1

## 2022-04-19 NOTE — Discharge Instructions (Addendum)
[facility], [place] [phone]  OPEN ABDOMINAL SURGERY: POST OP INSTRUCTIONS  Always review your discharge instruction sheet given to you by the facility where your surgery was performed.  IF YOU HAVE DISABILITY OR FAMILY LEAVE FORMS, YOU MUST BRING THEM TO THE OFFICE FOR PROCESSING.  PLEASE DO NOT GIVE THEM TO YOUR DOCTOR.  A prescription for pain medication may be given to you upon discharge.  Take your pain medication as prescribed, if needed.  If narcotic pain medicine is not needed, then you may take acetaminophen (Tylenol) or ibuprofen (Advil) as needed. Take your usually prescribed medications unless otherwise directed. If you need a refill on your pain medication, please contact your pharmacy. They will contact our office to request authorization.  Prescriptions will not be filled after 5pm or on week-ends. You should follow a light diet the first few days after arrival home, such as soup and crackers, pudding, etc.unless your doctor has advised otherwise. A high-fiber, low fat diet can be resumed as tolerated.   Be sure to include lots of fluids daily. Most patients will experience some swelling and bruising on the chest and neck area.  Ice packs will help.  Swelling and bruising can take several days to resolve Most patients will experience some swelling and bruising in the area of the incision. Ice pack will help. Swelling and bruising can take several days to resolve..  If the ileostomy output is too watery and thin, then take one or two doses of Imodium to make the consistency thicker.  You are looking for pancake batter consistency.  Stay well hydrated.  You may have steri-strips (small skin tapes) in place directly over the incision.  These strips should be left on the skin for 7-10 days.   ACTIVITIES:  You may resume regular (light) daily activities beginning the next day--such as daily self-care, walking, climbing stairs--gradually increasing activities as  tolerated.  You may have sexual intercourse when it is comfortable.  Refrain from any heavy lifting or straining until approved by your doctor. You may drive when you no longer are taking prescription pain medication, you can comfortably wear a seatbelt, and you can safely maneuver your car and apply brakes Return to Work: ___________________________________ Kyle Delacruz should see your doctor in the office for a follow-up appointment approximately two weeks after your surgery.  Make sure that you call for this appointment within a day or two after you arrive home to insure a convenient appointment time. OTHER INSTRUCTIONS:  _____________________________________________________________ _____________________________________________________________  WHEN TO CALL YOUR DOCTOR: Fever over 101.0 Inability to urinate Nausea and/or vomiting Extreme swelling or bruising Continued bleeding from incision. Increased pain, redness, or drainage from the incision. Difficulty swallowing or breathing Muscle cramping or spasms. Numbness or tingling in hands or feet or around lips.  The clinic staff is available to answer your questions during regular business hours.  Please don't hesitate to call and ask to speak to one of the nurses if you have concerns.  For further questions, please visit [email or web address] Information on my medicine - ELIQUIS (apixaban)  This medication education was reviewed with me or my healthcare representative as part of my discharge preparation.    Why was Eliquis prescribed for you? Eliquis was prescribed to treat blood clots that may have been found in the veins of your legs (deep vein thrombosis) or in your lungs (pulmonary embolism) and to reduce the risk of them occurring again.  What do You need to know  about Eliquis ? The starting dose is 5 mg tablet taken TWICE daily.  Eliquis may be taken with or without food.   Try to take the dose about the same time in the  morning and in the evening. If you have difficulty swallowing the tablet whole please discuss with your pharmacist how to take the medication safely.  Take Eliquis exactly as prescribed and DO NOT stop taking Eliquis without talking to the doctor who prescribed the medication.  Stopping may increase your risk of developing a new blood clot.  Refill your prescription before you run out.  After discharge, you should have regular check-up appointments with your healthcare provider that is prescribing your Eliquis.    What do you do if you miss a dose? If a dose of ELIQUIS is not taken at the scheduled time, take it as soon as possible on the same day and twice-daily administration should be resumed. The dose should not be doubled to make up for a missed dose.  Important Safety Information A possible side effect of Eliquis is bleeding. You should call your healthcare provider right away if you experience any of the following: Bleeding from an injury or your nose that does not stop. Unusual colored urine (red or dark brown) or unusual colored stools (red or black). Unusual bruising for unknown reasons. A serious fall or if you hit your head (even if there is no bleeding).  Some medicines may interact with Eliquis and might increase your risk of bleeding or clotting while on Eliquis. To help avoid this, consult your healthcare provider or pharmacist prior to using any new prescription or non-prescription medications, including herbals, vitamins, non-steroidal anti-inflammatory drugs (NSAIDs) and supplements.  This website has more information on Eliquis (apixaban): http://www.eliquis.com/eliquis/home

## 2022-04-19 NOTE — Progress Notes (Signed)
Progress Note  9 Days Post-Op  Subjective: Tolerating liquid diet, not hungry for more yet.  Having bowel movements.  Objective: Vital signs in last 24 hours: Temp:  [97.8 F (36.6 C)-98.6 F (37 C)] 98.3 F (36.8 C) (12/07 0324) Pulse Rate:  [94-110] 110 (12/07 0324) Resp:  [20] 20 (12/07 0324) BP: (115-139)/(53-72) 124/70 (12/07 0324) SpO2:  [91 %-96 %] 92 % (12/07 0324) Weight:  [112.4 kg] 112.4 kg (12/07 0330) Last BM Date : 04/18/22  Intake/Output from previous day: 12/06 0701 - 12/07 0700 In: -  Out: 1950 [Urine:850; Stool:1100] Intake/Output this shift: No intake/output data recorded.  PE: Abd: soft, appropriately ttp, mild to mod distention, +BS, ileostomy viable with liquid output in bag, incision C/D/I with staples present   Lab Results:  Recent Labs    04/18/22 0254 04/19/22 0437  WBC 23.1* 21.5*  HGB 8.4* 8.5*  HCT 26.7* 27.5*  PLT 262 312    BMET Recent Labs    04/18/22 0254 04/19/22 0437  NA 139 141  K 4.1 4.1  CL 107 107  CO2 23 21*  GLUCOSE 109* 114*  BUN 13 15  CREATININE 0.95 0.91  CALCIUM 7.9* 8.0*    PT/INR No results for input(s): "LABPROT", "INR" in the last 72 hours. CMP     Component Value Date/Time   NA 141 04/19/2022 0437   K 4.1 04/19/2022 0437   CL 107 04/19/2022 0437   CO2 21 (L) 04/19/2022 0437   GLUCOSE 114 (H) 04/19/2022 0437   BUN 15 04/19/2022 0437   CREATININE 0.91 04/19/2022 0437   CALCIUM 8.0 (L) 04/19/2022 0437   PROT 6.3 (L) 07/27/2021 0556   ALBUMIN 2.9 (L) 07/27/2021 0556   AST 14 (L) 07/27/2021 0556   ALT 13 07/27/2021 0556   ALKPHOS 50 07/27/2021 0556   BILITOT 1.3 (H) 07/27/2021 0556   GFRNONAA >60 04/19/2022 0437   GFRAA >60 08/19/2019 1417   Lipase     Component Value Date/Time   LIPASE 42 04/08/2022 0818       Studies/Results: DG CHEST PORT 1 VIEW  Result Date: 04/17/2022 CLINICAL DATA:  Fever. EXAM: PORTABLE CHEST 1 VIEW COMPARISON:  AP chest 04/12/2022 in 04/10/2022; CT chest  04/08/2022 FINDINGS: Interval extubation and removal of enteric tube. Surgical clips overlie the right upper abdominal quadrant. Mildly decreased lung volumes. The lungs are clear. No pleural effusion or pneumothorax. Heart size is at the upper limits of normal. Mediastinal contours are within normal limits. Surgical clips overlie the left thyroid gland as seen on prior CT. Partial visualization of posterior right cervical spine laminoplasty hardware. IMPRESSION: Interval extubation and removal of enteric tube. No acute cardiopulmonary process. Electronically Signed   By: Yvonne Kendall M.D.   On: 04/17/2022 10:07   DG Abd Portable 2V  Result Date: 04/17/2022 CLINICAL DATA:  Fever EXAM: PORTABLE ABDOMEN - 2 VIEW COMPARISON:  Abdominal radiograph dated April 10, 2022 FINDINGS: Gaseous distention of multiple colonic loops suggesting ileus. No evidence of extraluminal free air. Surgical staples over the midabdomen and lumbar spine hardware is unchanged. Bilateral hip arthroplasty with intact hardware. IMPRESSION: Gaseous distention of multiple colonic loops suggesting ileus. Electronically Signed   By: Keane Police D.O.   On: 04/17/2022 09:57    Anti-infectives: Anti-infectives (From admission, onward)    Start     Dose/Rate Route Frequency Ordered Stop   04/10/22 2107  metroNIDAZOLE (FLAGYL) 500 MG/100ML IVPB       Note to Pharmacy: Enis Gash  W: cabinet override      04/10/22 2107 04/10/22 2114   04/10/22 2103  ceFAZolin (ANCEF) 2-4 GM/100ML-% IVPB       Note to Pharmacy: Enis Gash W: cabinet override      04/10/22 2103 04/11/22 0914        Assessment/Plan  Colonic ileus with hemoperitoneum  S/P exploratory laparotomy, total abdominal colectomy, end ileostomy 04/10/22 - FLD - unsure cause of leukocytosis and fevers - wound appears healthy on exam, slight downtrend today, continue to follow - ileostomy functioning - continue to mobilize  FEN: FLD VTE: heparin gtt ID:  ancef/flagyl pre-op  - below per TRH -  Acute submassive PE/RLE DVT Hemorrhagic shock, ABL anemia - hgb 8.9 from 9 yesterday AM Acute respiratory failure with hypoxia Hypothyroidism HTN GERD Hyperglycemia  Obesity class I  LOS: 11 days     Kyle Delacruz, [facility] 04/19/2022, 8:21 AM Please see Amion for pager number during day hours 7:00am-4:30pm

## 2022-04-19 NOTE — Progress Notes (Signed)
PROGRESS NOTE    SHISHIR KRANTZ  OVF:[id number] DOB: [birth year] DOA: 04/08/2022 PCP: Prince Solian, MD    Brief Narrative:  Patient is a 66 year old male with provoked DVT who presented after lumbar surgery with worsening shortness of breath and RLE swelling. 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  Echo/CTA showing some RV strain. Hospital course complicated by severe constipation vs. colonic ileus partially relieved with enema 04/10/22.  He developed searing epigastric pain with radiation to shoulder, and worsened with movement after enema. He subsequently developed worsening hypoxia, tachycardia and hypotension requiring intubation on 11/28.  CT results showed a large pericolonic hematoma tracking into the peritoneum and right hemiliver.  Mass transfusion protocol was activated and he was evaluated by surgery and underwent emergency ex lap with total colectomy and end ileostomy on 04/10/2022.  Started on IV heparin on 11/29 He was extubated on 11/30 and weaned off of vasopressors on 12/1     Assessment & Plan:   Acute submassive pulmonary embolism with hemorrhagic shock: Right lower extremity DVT: Initially on Eliquis after surgery, developed massive hemorrhage.  Controlled and now on heparin.   Start Eliquis 5 mg twice daily today, lifelong anticoagulation due to submassive PE and recurrent events.  Will monitor on oral anticoagulation before discharge.    Acute respiratory failure with hypoxemia: Extubated on 11/30.  On room air today. Mobility and chest physiotherapy.  Post colonic ileus, acute blood loss anemia with intra-abdominal hematoma: Status post ex lap. Ex lap, total colectomy and end ileostomy 11/28.  Currently tolerating full liquid diet.  Postop management as per surgery. Leukocytosis, downtrending without evidence of infection.  Monitor.  Recheck tomorrow. Blood cultures, negative so far. Urinalysis, more than 100,000 colonies of Enterobacter cloacae.  Probably from  intraperitoneal.  Will discuss with ID, may treat with short course of antibiotics. Procalcitonin,1.3  Acute UTI, not present on admission.  Enterobacter cloacae.  Will discuss with ID, will prefer to treat due to high fever and leukocytosis that is already improving now.  Hypothyroidism: On IV Synthroid.  Change to oral Synthroid.  Essential hypertension: Blood pressure stable on Norvasc and metoprolol.  GERD: PPI.  Continue to increase mobility. Anticipate tomorrow if continues to do well on anticoagulation and bowel function.  DVT prophylaxis: Place TED hose Start: 04/09/22 0021 apixaban (ELIQUIS) tablet 5 mg   Code Status: Full code Family Communication: Wife on the phone. Disposition Plan: Status is: Inpatient Remains inpatient appropriate because: Waiting for diet tolerance.  Challenging with oral anticoagulation.     Consultants:  Critical care General surgery Infectious disease  Procedures:  Multiple procedures as above  Antimicrobials:  Currently without antibiotics   Subjective: Seen and examined.  In chair.  He was very happy that he walked yesterday with minimal support.  Remains on room air.  Does have mild bloating in the epigastrium.  He is scared to eat much because of burping and belching.  Reported more than 1000 mL of loose stool in the bag.  Objective: Vitals:   04/18/22 2348 04/19/22 0324 04/19/22 0330 04/19/22 1041  BP: 130/72 124/70  129/81  Pulse: (!) 109 (!) 110  (!) 110  Resp: '20 20  20  '$ Temp: 98.5 F (36.9 C) 98.3 F (36.8 C)  98 F (36.7 C)  TempSrc: Oral Oral    SpO2: 92% 92%  96%  Weight:   112.4 kg   Height:        Intake/Output Summary (Last 24 hours) at 04/19/2022 1053 Last data filed at 04/19/2022  0301 Gross per 24 hour  Intake --  Output 1700 ml  Net -1700 ml   Filed Weights   04/17/22 0459 04/18/22 0500 04/19/22 0330  Weight: 108.5 kg 108 kg 112.4 kg    Examination:  General exam: Appears calm and comfortable.   Sitting in chair. Respiratory system: Clear to auscultation. Respiratory effort normal.  No added sounds. Cardiovascular system: S1 & S2 heard, RRR.  No pedal edema. Gastrointestinal system: Soft.  Distended.  Mild diffuse tenderness.  Bowel sounds present.  Right lower quadrant ileostomy bag with some liquid secretions.  Midline incision clean and dry. Central nervous system: Alert and oriented. No focal neurological deficits. Extremities: Symmetric 5 x 5 power. Skin: No rashes, lesions or ulcers Psychiatry: Judgement and insight appear normal. Mood & affect appropriate.     Data Reviewed: I have personally reviewed following labs and imaging studies  CBC: Recent Labs  Lab 04/15/22 0410 04/15/22 1522 04/16/22 0534 04/16/22 1007 04/17/22 0246 04/18/22 0254 04/19/22 0437  WBC 12.8*  --  15.1*  --  22.7* 23.1* 21.5*  NEUTROABS  --   --   --   --   --   --  17.6*  HGB 8.4*   < > 9.0*  9.1* 9.5* 8.9* 8.4* 8.5*  HCT 26.1*   < > 28.0*  28.4* 29.1* 27.7* 26.7* 27.5*  MCV 91.9  --  91.3  --  90.8 93.7 93.9  PLT 159  --  218  --  239 262 312   < > = values in this interval not displayed.   Basic Metabolic Panel: Recent Labs  Lab 04/13/22 0845 04/14/22 0236 04/15/22 0410 04/16/22 0534 04/17/22 0246 04/18/22 0254 04/19/22 0437  NA 128* 139 141 139 140 139 141  K 3.6 3.5 3.1* 3.4* 3.4* 4.1 4.1  CL 93* 104 106 106 106 107 107  CO2 '29 24 24 23 24 23 '$ 21*  GLUCOSE 115* 113* 112* 113* 104* 109* 114*  BUN '12 9 9 9 8 13 15  '$ CREATININE 0.90 1.01 0.93 0.92 0.89 0.95 0.91  CALCIUM 7.5* 8.1* 8.0* 8.2* 8.0* 7.9* 8.0*  MG 1.9  --   --   --   --   --  1.8  PHOS  --  3.7 3.0 3.0  --   --  2.6   GFR: Estimated Creatinine Clearance: 103 mL/min (by C-G formula based on SCr of 0.91 mg/dL). Liver Function Tests: No results for input(s): "AST", "ALT", "ALKPHOS", "BILITOT", "PROT", "ALBUMIN" in the last 168 hours. No results for input(s): "LIPASE", "AMYLASE" in the last 168 hours. No results  for input(s): "AMMONIA" in the last 168 hours. Coagulation Profile: No results for input(s): "INR", "PROTIME" in the last 168 hours. Cardiac Enzymes: No results for input(s): "CKTOTAL", "CKMB", "CKMBINDEX", "TROPONINI" in the last 168 hours. BNP (last 3 results) No results for input(s): "PROBNP" in the last 8760 hours. HbA1C: No results for input(s): "HGBA1C" in the last 72 hours. CBG: Recent Labs  Lab 04/18/22 1639 04/18/22 2029 04/18/22 2346 04/19/22 0321 04/19/22 0724  GLUCAP 100* 102* 102* 105* 103*   Lipid Profile: No results for input(s): "CHOL", "HDL", "LDLCALC", "TRIG", "CHOLHDL", "LDLDIRECT" in the last 72 hours. Thyroid Function Tests: No results for input(s): "TSH", "T4TOTAL", "FREET4", "T3FREE", "THYROIDAB" in the last 72 hours. Anemia Panel: No results for input(s): "VITAMINB12", "FOLATE", "FERRITIN", "TIBC", "IRON", "RETICCTPCT" in the last 72 hours. Sepsis Labs: Recent Labs  Lab 04/13/22 0845 04/16/22 0534 04/17/22 [place]  --  0.62 1.30  LATICACIDVEN 1.0  --   --     Recent Results (from the past 240 hour(s))  Culture, blood (Routine X 2) w Reflex to ID Panel     Status: None (Preliminary result)   Collection Time: 04/17/22  9:43 AM   Specimen: BLOOD  Result Value Ref Range Status   Specimen Description   Final    BLOOD BLOOD LEFT ARM Performed at [facility] [address], [place], [place] [address]    Special Requests   Final    BOTTLES DRAWN AEROBIC ONLY Blood Culture adequate volume Performed at [facility] [address] [place], [place] [address]    Culture   Final    NO GROWTH 2 DAYS Performed at [facility] [address] [place], [place] [address]    Report Status PENDING  Incomplete  Culture, blood (Routine X 2) w Reflex to ID Panel     Status: None (Preliminary result)   Collection Time: 04/17/22  9:43 AM   Specimen: BLOOD  Result Value Ref Range Status   Specimen  Description   Final    BLOOD BLOOD LEFT FOREARM Performed at [facility] [address] [place], [place] [address]    Special Requests   Final    BOTTLES DRAWN AEROBIC ONLY Blood Culture adequate volume Performed at [facility] [address], [place], [place] [address]    Culture   Final    NO GROWTH 2 DAYS Performed at [facility] [address] [place], [place] [address]    Report Status PENDING  Incomplete  Urine Culture     Status: Abnormal   Collection Time: 04/17/22  1:52 PM   Specimen: Urine, Clean Catch  Result Value Ref Range Status   Specimen Description   Final    URINE, CLEAN CATCH Performed at [facility], [place] [address], [place], [place] [address]    Special Requests   Final    NONE Performed at [facility], [place] [address] [place], [place] [address]    Culture >=100,000 COLONIES/mL ENTEROBACTER CLOACAE (A)  Final   Report Status 04/19/2022 FINAL  Final   Organism ID, Bacteria ENTEROBACTER CLOACAE (A)  Final      Susceptibility   Enterobacter cloacae - MIC*    CEFAZOLIN >=64 RESISTANT Resistant     CEFEPIME <=0.12 SENSITIVE Sensitive     CIPROFLOXACIN <=0.25 SENSITIVE Sensitive     GENTAMICIN <=1 SENSITIVE Sensitive     IMIPENEM <=0.25 SENSITIVE Sensitive     NITROFURANTOIN 64 INTERMEDIATE Intermediate     TRIMETH/SULFA <=20 SENSITIVE Sensitive     PIP/TAZO <=4 SENSITIVE Sensitive     * >=100,000 COLONIES/mL ENTEROBACTER CLOACAE         Radiology Studies: No results found.      Scheduled Meds:  acetaminophen  650 mg Oral Q6H   amLODipine  10 mg Oral Daily   apixaban  5 mg Oral BID   insulin aspart  0-9 Units Subcutaneous Q4H   levothyroxine  50 mcg Oral QAC breakfast   metoprolol tartrate  25 mg Oral BID   multivitamin with minerals  1 tablet Oral Daily   pantoprazole (PROTONIX) IV  40 mg Intravenous Daily   Ensure Max Protein  11 oz Oral BID    Continuous Infusions:  sodium chloride Stopped (04/15/22 1829)     LOS: 11 days    Time spent:  76 minutes    Barb Merino, MD [facility] Pager [phone]

## 2022-04-19 NOTE — Care Management Important Message (Signed)
Important Message  Patient Details IM Letter given to the Patient. Name: Kyle Delacruz MRN: [id number] Date of Birth: Feb 25, 1956   Medicare Important Message Given:  Yes     Kerin Salen 04/19/2022, 10:58 AM

## 2022-04-19 NOTE — Consult Note (Signed)
[facility] Nurse ostomy consult note Patient's wife has reached out to my associate, M. Austin, via email to express her concern that her husband is not increasing in ostomy independence. I returned the email stating that often it is a gradual transition from assisted to independence. I have also asked staff to assist patient with emptying the pouch for the rest of today and through the night. I plan to change his pouch in the morning.  HHRN is on board to assist with twice weekly changes until patient is independent, but he and his wife (if she is willing) must be independent in emptying the pouch prior to discharge.If he is unable and his wife unwilling or unable, the patient may need a higher level of care (e.g., SNF/Rehab) until independent with ostomy care.  [facility] nursing team will follow, and will remain available to this patient, the nursing and medical teams.    Thank you for inviting [place] to participate in this patient's Plan of Care.  Maudie Flakes, MSN, RN, CNS, [place], Serita Grammes, [employer], [employer] phone:  [phone]

## 2022-04-19 NOTE — Progress Notes (Signed)
Patient's wife, Kyle Delacruz, at [phone] has an appointment and unable to be here when doctor and PT are rounding on patient. She is requesting to please call her to give her an update please. Patient states it is okay to share information on care and plan of care to his wife.

## 2022-04-19 NOTE — Progress Notes (Signed)
ANTICOAGULATION CONSULT NOTE - Follow Up Consult  Pharmacy Consult for heparin --> eliquis Indication: recurrent/acute  pulmonary embolus and DVT  Allergies  Allergen Reactions   Bee Venom     Other reaction(s): Unknown   Lisinopril     Other reaction(s): Unknown   Metoclopramide Other (See Comments)    Other reaction(s): shaking too bad "Sweat like crazy"    Penicillins Hives    Did it involve swelling of the face/tongue/throat, SOB, or low BP? Yes Did it involve sudden or severe rash/hives, skin peeling, or any reaction on the inside of your mouth or nose? Yes Did you need to seek medical attention at a hospital or doctor's office? No When did it last happen?  childhood      If all above answers are "NO", may proceed with cephalosporin use.     Shellfish Allergy Swelling    Patient Measurements: Height: '5\' 10"'$  (177.8 cm) Weight: 112.4 kg (247 lb 11.2 oz) IBW/kg (Calculated) : 73 Heparin Dosing Weight:   Vital Signs: Temp: 98 F (36.7 C) (12/07 1041) Temp Source: Oral (12/07 0324) BP: 129/81 (12/07 1041) Pulse Rate: 110 (12/07 1041)  Labs: Recent Labs    04/17/22 0246 04/17/22 1247 04/17/22 1848 04/18/22 0254 04/19/22 0437  HGB 8.9*  --   --  8.4* 8.5*  HCT 27.7*  --   --  26.7* 27.5*  PLT 239  --   --  262 312  HEPARINUNFRC 0.29*   < > 0.53 0.36 0.50  CREATININE 0.89  --   --  0.95 0.91   < > = values in this interval not displayed.    Estimated Creatinine Clearance: 103 mL/min (by C-G formula based on SCr of 0.91 mg/dL).   Assessment: Patient is a 66 y.o M with hx DVT/PE on March 2023 (s/p treatment with Eliquis for 6 months) and s/p L2-5 decompression/fusion surgery on 04/04/22, who presented to the ED on 04/08/22 with c/o abdominal distention, pain and SOB.  Chest CT on 04/08/22 showed acute PE with evidence of RHS. RLE US showed "occlusive deep venous thrombosis is noted in the right femoral and popliteal veins."  He was started on heparin drip on  04/08/22.  On 04/10/22, he underwent exp lap with total colectomy and end ileostomy. He was subsequently found to have a large intra-abdominal hematoma s/p the ex-lap procedure. Heparin drip held, TXA x1 does given, and transfused with 6 units PRBC and 4 units FFP on 04/10/22. Heparin resumed back on 04/11/22.  Pharmacy has been consulted on 04/19/22 to transition patient to Eliquis.  Today, 04/19/2022: - cbc stable - no new bleeding documented - Scr <1    Plan:  - d/c heparin drip - start Eliquis 5 mg bid. Will not give Eliquis 10 mg bib x7 days since patient already received about a week of therapeutic heparin and d/t high bleeding risk with hematoma.  Dr. Sloan Leiter informed of [facility] will sign off, but will follow patient peripherally along with you.  Re-consult [place] if need further assistance.  Kashon Kraynak P 04/19/2022,10:50 AM

## 2022-04-19 NOTE — Progress Notes (Signed)
    [facility] for Infectious Disease    Date of Admission:  04/08/2022   Total days of antibiotics  ID: Kyle Delacruz is a 66 y.o. male with  Principal Problem:   PE (pulmonary thromboembolism) ([facility]) Active Problems:   Hypothyroidism   Essential hypertension   Gastro-esophageal reflux disease without esophagitis   Obstructive sleep apnea (adult) (pediatric)   Acute respiratory failure with hypoxia (HCC)   Leg DVT (deep venous thromboembolism), chronic, right (HCC)   Abdominal distension   Deep vein thrombosis (DVT) of femoral vein of right lower extremity (HCC)   Normocytic anemia   Shock (HCC)    Subjective: Afebrile. Not having any nightsweats. Having still some abdominal distention after eating. No nausea or vomiting  He denies any pelvic pain or dysuria or sensation for urinary frequency  Medications:   acetaminophen  650 mg Oral Q6H   amLODipine  10 mg Oral Daily   apixaban  5 mg Oral BID   insulin aspart  0-9 Units Subcutaneous Q4H   levothyroxine  50 mcg Oral QAC breakfast   metoprolol tartrate  25 mg Oral BID   multivitamin with minerals  1 tablet Oral Daily   [START ON 04/20/2022] pantoprazole  40 mg Oral Daily   Ensure Max Protein  11 oz Oral BID    Objective: Vital signs in last 24 hours: Temp:  [97.7 F (36.5 C)-98.6 F (37 C)] 97.7 F (36.5 C) (12/07 1312) Pulse Rate:  [103-110] 106 (12/07 1312) Resp:  [16-20] 16 (12/07 1312) BP: (118-130)/(68-81) 118/73 (12/07 1312) SpO2:  [91 %-96 %] 93 % (12/07 1312) Weight:  [112.4 kg] 112.4 kg (12/07 0330)  Physical Exam  Constitutional: He is oriented to person, place, and time. He appears well-developed and well-nourished. No distress.  HENT:  Mouth/Throat: Oropharynx is clear and moist. No oropharyngeal exudate.  Cardiovascular: Normal rate, regular rhythm and normal heart sounds. Exam reveals no gallop and no friction rub.  No murmur heard.  Pulmonary/Chest: Effort normal and breath sounds normal.  No respiratory distress. He has no wheezes.  Abdominal: Soft. Bowel sounds are normal. Ostomy with fluid and gas in the bag. + distension. There is no tenderness.  Lymphadenopathy:  He has no cervical adenopathy.  Neurological: He is alert and oriented to person, place, and time.  Skin: Skin is warm and dry. No rash noted. No erythema.  Psychiatric: He has a normal mood and affect. His behavior is normal.    Lab Results Recent Labs    04/18/22 0254 04/19/22 0437  WBC 23.1* 21.5*  HGB 8.4* 8.5*  HCT 26.7* 27.5*  NA 139 141  K 4.1 4.1  CL 107 107  CO2 23 21*  BUN 13 15  CREATININE 0.95 0.91    Microbiology: Urine cx = e.cloacae Blood cx ngtd Studies/Results: No results found.   Assessment/Plan: Leukocytosis = slightly improved today but appears to be trending down. No other source for leukocytosis. Fever has resolved without antibiotics  Asymptomatic bacturia = at this time, he does not have urinary symptoms. Would not recommend to treat at this time.  [facility] for Infectious Diseases Pager: [phone]  04/19/2022, 5:39 PM

## 2022-04-20 ENCOUNTER — Other Ambulatory Visit: Payer: Self-pay

## 2022-04-20 DIAGNOSIS — I2699 Other pulmonary embolism without acute cor pulmonale: Secondary | ICD-10-CM | POA: Diagnosis not present

## 2022-04-20 LAB — CBC WITH DIFFERENTIAL/PLATELET
Abs Immature Granulocytes: 0.18 10*3/uL — ABNORMAL HIGH (ref 0.00–0.07)
Basophils Absolute: 0 10*3/uL (ref 0.0–0.1)
Basophils Relative: 0 %
Eosinophils Absolute: 0.2 10*3/uL (ref 0.0–0.5)
Eosinophils Relative: 1 %
HCT: 28.4 % — ABNORMAL LOW (ref 39.0–52.0)
Hemoglobin: 8.7 g/dL — ABNORMAL LOW (ref 13.0–17.0)
Immature Granulocytes: 1 %
Lymphocytes Relative: 9 %
Lymphs Abs: 1.7 10*3/uL (ref 0.7–4.0)
MCH: 28.9 pg (ref 26.0–34.0)
MCHC: 30.6 g/dL (ref 30.0–36.0)
MCV: 94.4 fL (ref 80.0–100.0)
Monocytes Absolute: 1.4 10*3/uL — ABNORMAL HIGH (ref 0.1–1.0)
Monocytes Relative: 8 %
Neutro Abs: 14.8 10*3/uL — ABNORMAL HIGH (ref 1.7–7.7)
Neutrophils Relative %: 81 %
Platelets: 369 10*3/uL (ref 150–400)
RBC: 3.01 MIL/uL — ABNORMAL LOW (ref 4.22–5.81)
RDW: 16.8 % — ABNORMAL HIGH (ref 11.5–15.5)
WBC: 18.3 10*3/uL — ABNORMAL HIGH (ref 4.0–10.5)
nRBC: 0 % (ref 0.0–0.2)

## 2022-04-20 LAB — GLUCOSE, CAPILLARY
Glucose-Capillary: 101 mg/dL — ABNORMAL HIGH (ref 70–99)
Glucose-Capillary: 102 mg/dL — ABNORMAL HIGH (ref 70–99)
Glucose-Capillary: 106 mg/dL — ABNORMAL HIGH (ref 70–99)
Glucose-Capillary: 111 mg/dL — ABNORMAL HIGH (ref 70–99)
Glucose-Capillary: 114 mg/dL — ABNORMAL HIGH (ref 70–99)
Glucose-Capillary: 115 mg/dL — ABNORMAL HIGH (ref 70–99)
Glucose-Capillary: 119 mg/dL — ABNORMAL HIGH (ref 70–99)
Glucose-Capillary: 93 mg/dL (ref 70–99)

## 2022-04-20 LAB — BASIC METABOLIC PANEL
Anion gap: 12 (ref 5–15)
BUN: 15 mg/dL (ref 8–23)
CO2: 22 mmol/L (ref 22–32)
Calcium: 8 mg/dL — ABNORMAL LOW (ref 8.9–10.3)
Chloride: 101 mmol/L (ref 98–111)
Creatinine, Ser: 0.9 mg/dL (ref 0.61–1.24)
GFR, Estimated: >60 mL/min (ref >60)
Glucose, Bld: 114 mg/dL — ABNORMAL HIGH (ref 70–99)
Potassium: 3.7 mmol/L (ref 3.5–5.1)
Sodium: 135 mmol/L (ref 135–145)

## 2022-04-20 LAB — MAGNESIUM: Magnesium: 2 mg/dL (ref 1.7–2.4)

## 2022-04-20 MED ORDER — METOPROLOL TARTRATE 50 MG PO TABS
50.0000 mg | ORAL_TABLET | Freq: Two times a day (BID) | ORAL | Status: DC
  Administered 2022-04-20 – 2022-04-22 (×5): 50 mg via ORAL
  Filled 2022-04-20 (×5): qty 1

## 2022-04-20 MED ORDER — CALCIUM POLYCARBOPHIL 625 MG PO TABS
625.0000 mg | ORAL_TABLET | Freq: Two times a day (BID) | ORAL | Status: DC
  Administered 2022-04-20 – 2022-04-22 (×5): 625 mg via ORAL
  Filled 2022-04-20 (×5): qty 1

## 2022-04-20 NOTE — Consult Note (Signed)
[facility] Nurse ostomy consult note Stoma type/location: RUQ ileostomy Stomal assessment/size: 1 and 1/2 inches round, red, raised Peristomal assessment: intact, clear Treatment options for stomal/peristomal skin: skin barrier ring Output: 150ms brown liquid effluent Ostomy pouching: 2pc. 2 and 1/4 inch pouching system with skin barrier ring Education provided:   Wife able to demonstrated pouch change (cutting new skin barrier, measuring stoma, cleaning peristomal skin and stoma, use of barrier ring) Education on emptying when 1/3 to 1/2 full and how to empty, patient encouraged to notify spouse when time to empty or to empty pouch when using bathroom to void Demonstrated use of wick to clean spout  Discussed bathing, diet, gas, medication use, constipation, diarrhea, dehydration  Discussed food blockage   Discussed risk of peristomal hernia Answered patient/family questions about increasing participation from patient.  Patient is amenable to assisting with emptying pouch and eventually learning to change or assist wife with pouch change.   [facility] [facility] in [facility]: Yes  Supplies in room for discharge:  5 skin barriers, 5 pouches, 5 skin barrier rings.  Recommend HHRN for continued support at home until independent. [facility] is scheduled for TUES, 12/12.  [facility] will follow, and will remain available to this patient, the nursing and medical teams.    Thank you for inviting [place] participate in this patient's Plan of Care.  LMaudie Flakes MSN, RN, CNS, [place] CSerita Grammes [employer]:  [phone]

## 2022-04-20 NOTE — Progress Notes (Addendum)
Progress Note  10 Days Post-Op  Subjective: Still very low appetite but no worsening abdominal pain and no nausea or emesis. Having bowel function. Mobilizing. States he feels increasingly comfortable caring for his ostomy  Objective: Vital signs in last 24 hours: Temp:  [97.7 F (36.5 C)-98.1 F (36.7 C)] 98.1 F (36.7 C) (12/08 0556) Pulse Rate:  [100-110] 100 (12/08 0556) Resp:  [16-20] 19 (12/08 0556) BP: (114-129)/(68-81) 121/69 (12/08 0556) SpO2:  [92 %-96 %] 92 % (12/08 0556) Weight:  [108.8 kg] 108.8 kg (12/08 0556) Last BM Date : 04/19/22  Intake/Output from previous day: 12/07 0701 - 12/08 0700 In: 4525.2 [P.O.:1080; I.V.:3445.2] Out: 3000 [Urine:1550; Stool:1450] Intake/Output this shift: No intake/output data recorded.  PE: Abd: soft, appropriately ttp, mild distention, +BS, ileostomy viable with liquid and gas output in bag, incision C/D/I with staples present   Lab Results:  Recent Labs    04/19/22 0437 04/20/22 0412  WBC 21.5* 18.3*  HGB 8.5* 8.7*  HCT 27.5* 28.4*  PLT 312 369    BMET Recent Labs    04/19/22 0437 04/20/22 0412  NA 141 135  K 4.1 3.7  CL 107 101  CO2 21* 22  GLUCOSE 114* 114*  BUN 15 15  CREATININE 0.91 0.90  CALCIUM 8.0* 8.0*    PT/INR No results for input(s): "LABPROT", "INR" in the last 72 hours. CMP     Component Value Date/Time   NA 135 04/20/2022 0412   K 3.7 04/20/2022 0412   CL 101 04/20/2022 0412   CO2 22 04/20/2022 0412   GLUCOSE 114 (H) 04/20/2022 0412   BUN 15 04/20/2022 0412   CREATININE 0.90 04/20/2022 0412   CALCIUM 8.0 (L) 04/20/2022 0412   PROT 6.3 (L) 07/27/2021 0556   ALBUMIN 2.9 (L) 07/27/2021 0556   AST 14 (L) 07/27/2021 0556   ALT 13 07/27/2021 0556   ALKPHOS 50 07/27/2021 0556   BILITOT 1.3 (H) 07/27/2021 0556   GFRNONAA >60 04/20/2022 0412   GFRAA >60 08/19/2019 1417   Lipase     Component Value Date/Time   LIPASE 42 04/08/2022 0818       Studies/Results: No results  found.  Anti-infectives: Anti-infectives (From admission, onward)    Start     Dose/Rate Route Frequency Ordered Stop   04/10/22 2107  metroNIDAZOLE (FLAGYL) 500 MG/100ML IVPB       Note to Pharmacy: Enis Gash W: cabinet override      04/10/22 2107 04/10/22 2114   04/10/22 2103  ceFAZolin (ANCEF) 2-4 GM/100ML-% IVPB       Note to Pharmacy: Enis Gash W: cabinet override      04/10/22 2103 04/11/22 0914        Assessment/Plan  Colonic ileus with hemoperitoneum  POD 10 S/P exploratory laparotomy, total abdominal colectomy, end ileostomy Dr. Thermon Leyland 04/10/22 - Advance to regular diet. Continue ensures - unsure cause of leukocytosis and prior fevers - wound remains stable/healthy on exam, down to 18 today, continue to follow - ileostomy functioning - high output (1450 ml/24H) start fiber - continue to mobilize  Dispo: monitor ileostomy output on fiber. Monitor diet tolerance. Pending ileostomy output may be stable for dc in next 24-48 hours  FEN: regular VTE: heparin gtt >> now on eliquis ID: [email or web address] pre-op  - below per TRH -  Acute submassive PE/RLE DVT Hemorrhagic shock, ABL anemia - hgb stable Acute respiratory failure with hypoxia Hypothyroidism HTN GERD Hyperglycemia  Obesity class I   LOS: 12 days  Winferd Humphrey, [facility] 04/20/2022, 7:57 AM Please see [facility] for pager number during day hours 7:00am-4:30pm

## 2022-04-20 NOTE — Progress Notes (Signed)
Notified on call provider at the beginning of the shift that I was made aware that patient had a run of A. Fib/artifact around 1825. On call provider instructed RN to let on call provider know if it happens again.   Around 0230 patient had a 25 beat run of V tach, and RN made on call provider aware. Checked on patient and patient was okay and asymptomatic. Patient had been resting. On call provider put in an order to check patient's magnesium and to do a 12-lead-EKG. EKG was completed and copy was put in patient's shadow chart.

## 2022-04-20 NOTE — Progress Notes (Signed)
Physical Therapy Treatment Patient Details Name: Kyle Delacruz MRN: [id number] DOB: 10-22-1955 Today's Date: 04/20/2022   History of Present Illness Pt is a 66yo male presenting to [facility] on 11/26 with worsening SOB and RLE swelling with revealed PE, also constipation. Pt is s/p L2-L5 decompression & fusion 11/22. Underwent emergency exploratory laparatomy with total colectomy and end ileostomy on 11/28, also intubated 11/28. Extubated 11/30.  PMH: DM, hx of DVT 07/2021, GERD, gout, HLD, hypothyroidism, OSA, OSA non-compliant with CPAP, b/l TKA R-1997 L-2001, b/l THA R-2006 L-2009.    PT Comments    Pt reports fatigue and weakness today however ambulated in hallway as tolerated.  Pt reminded of maintaining back precautions during mobility.   Recommendations for follow up therapy are one component of a multi-disciplinary discharge planning process, led by the attending physician.  Recommendations may be updated based on patient status, additional functional criteria and insurance authorization.  Follow Up Recommendations  Home health PT     Assistance Recommended at Discharge Intermittent Supervision/Assistance  Patient can return home with the following A little help with walking and/or transfers;A little help with bathing/dressing/bathroom;Assistance with cooking/housework;Assist for transportation;Help with stairs or ramp for entrance   Equipment Recommendations  None recommended by PT    Recommendations for Other Services       Precautions / Restrictions Precautions Precautions: Fall;Back Restrictions Other Position/Activity Restrictions: Unable to wear spinal brace due to new abdominal incision and colostomy     Mobility  Bed Mobility               General bed mobility comments: pt in recliner (not positioned well after having a spinal surgery so repositioned with pillows and maintaining precautions end of session)    Transfers Overall transfer level: Needs  assistance Equipment used: Rolling walker (2 wheels) Transfers: Sit to/from Stand Sit to Stand: Min guard           General transfer comment: verbal cues for back precautions and hand placement    Ambulation/Gait Ambulation/Gait assistance: Min guard Gait Distance (Feet): 120 Feet Assistive device: Rolling walker (2 wheels) Gait Pattern/deviations: Step-through pattern, Trunk flexed Gait velocity: decreased     General Gait Details: verbal cues for RW positioning and posture   Stairs             Wheelchair Mobility    Modified Rankin (Stroke Patients Only)       Balance                                            Cognition Arousal/Alertness: Awake/alert Behavior During Therapy: WFL for tasks assessed/performed Overall Cognitive Status: Within Functional Limits for tasks assessed                                          Exercises      General Comments        Pertinent Vitals/Pain Pain Assessment Pain Assessment: 0-10 Pain Score: 4  Pain Location: abdomen, low back Pain Descriptors / Indicators: Sore Pain Intervention(s): Monitored during session, Repositioned    Home Living                          Prior Function  PT Goals (current goals can now be found in the care plan section) Progress towards PT goals: Progressing toward goals    Frequency    Min 3X/week      PT Plan Current plan remains appropriate    Co-evaluation              AM-PAC PT "6 Clicks" Mobility   Outcome Measure  Help needed turning from your back to your side while in a flat bed without using bedrails?: A Little Help needed moving from lying on your back to sitting on the side of a flat bed without using bedrails?: A Little Help needed moving to and from a bed to a chair (including a wheelchair)?: A Little Help needed standing up from a chair using your arms (e.g., wheelchair or bedside chair)?: A  Little Help needed to walk in hospital room?: A Little Help needed climbing 3-5 steps with a railing? : A Lot 6 Click Score: 17    End of Session Equipment Utilized During Treatment: Gait belt Activity Tolerance: Patient tolerated treatment well;No increased pain Patient left: in chair;with call bell/phone within reach;with family/visitor present   PT Visit Diagnosis: Difficulty in walking, not elsewhere classified (R26.2)     Time: 1610-9604 PT Time Calculation (min) (ACUTE ONLY): 14 min  Charges:  $Gait Training: 8-22 mins                     Arlyce Dice, DPT Physical Therapist Acute Rehabilitation Services Preferred contact method: Secure Chat Weekend Pager Only: [phone] Office: [place] 04/20/2022, 3:58 PM

## 2022-04-20 NOTE — Progress Notes (Signed)
PROGRESS NOTE    Kyle Delacruz  RCV:[id number] DOB: 1956/03/22 DOA: 04/08/2022 PCP: Kyle Solian, MD    Brief Narrative:  Patient is a 66 year old male with provoked DVT who presented after lumbar surgery with worsening shortness of breath and RLE swelling. 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  Echo/CTA showing some RV strain. Hospital course complicated by severe constipation vs. colonic ileus partially relieved with enema 04/10/22.  He developed searing epigastric pain with radiation to shoulder, and worsened with movement after enema. He subsequently developed worsening hypoxia, tachycardia and hypotension requiring intubation on 11/28.  CT results showed a large pericolonic hematoma tracking into the peritoneum and right hemiliver.  Mass transfusion protocol was activated and he was evaluated by surgery and underwent emergency ex lap with total colectomy and end ileostomy on 04/10/2022.  Started on IV heparin on 11/29 He was extubated on 11/30 and weaned off of vasopressors on 12/1 12/8, went into A-fib overnight.  No previous history of A-fib.  Mostly with atrial tachycardia and PACs.  Already therapeutic on Eliquis.     Assessment & Plan:   Acute submassive pulmonary embolism with hemorrhagic shock: Right lower extremity DVT: Initially on Eliquis after surgery, developed massive hemorrhage.  Controlled and now on heparin.   Currently on Eliquis and tolerating well.  Plan to discharge on Eliquis without stopping for the time being.    Acute respiratory failure with hypoxemia: Extubated on 11/30.  On room air today. Mobility and chest physiotherapy.  Improved.  Post colonic ileus, acute blood loss anemia with intra-abdominal hematoma: Status post ex lap. Ex lap, total colectomy and end ileostomy 11/28.  Currently tolerating full liquid diet.  Postop management as per surgery. Leukocytosis, downtrending without evidence of infection.  Monitor.   Blood cultures, negative so  far. Urinalysis, more than 100,000 colonies of Enterobacter cloacae.  Not treating with antibiotics as symptomatically improving.  Hypothyroidism: Synthroid.  Essential hypertension: Blood pressure stable on Norvasc and metoprolol.  GERD: PPI.  New onset A-fib: Asymptomatic.  Therapeutic on Eliquis.  On metoprolol 25 mg twice daily.  Will increase metoprolol to 50 mg twice daily, decrease dose of amlodipine if blood pressure does not allow.  Will refer to A-fib clinic for follow-up.  Continue to increase mobility. Anticipate discharge home when surgically stable.   DVT prophylaxis: Place TED hose Start: 04/09/22 0021 apixaban (ELIQUIS) tablet 5 mg   Code Status: Full code Family Communication: None at the bedside. Disposition Plan: Status is: Inpatient Remains inpatient appropriate because: Waiting for diet tolerance.  Challenging with oral anticoagulation.  New onset A-fib.     Consultants:  Critical care General surgery Infectious disease Cardiology, curbside  Procedures:  Multiple procedures as above  Antimicrobials:  Currently without antibiotics   Subjective:  Seen and examined.  Himself denies any complaints.  Mild incisional pain and abdominal distention about 3 out of 10.  He tells me is comfortable using his ileostomy.  Denies any dysuria or frequency. Overnight events noted.  Telemetry reviewed.  Shows occasional episodes of A-fib/flutter.  Mostly atrial tachycardia.  Objective: Vitals:   04/19/22 1312 04/19/22 2052 04/20/22 0556 04/20/22 0907  BP: 118/73 114/68 121/69 96/83  Pulse: (!) 106 (!) 109 100 (!) 120  Resp: '16 18 19 18  '$ Temp: 97.7 F (36.5 C) 98.1 F (36.7 C) 98.1 F (36.7 C) 98.4 F (36.9 C)  TempSrc: Oral Oral Oral Oral  SpO2: 93% 93% 92% 95%  Weight:   108.8 kg   Height:  Intake/Output Summary (Last 24 hours) at 04/20/2022 1106 Last data filed at 04/20/2022 0900 Gross per 24 hour  Intake 1290 ml  Output 3050 ml  Net -1760 ml    Filed Weights   04/18/22 0500 04/19/22 0330 04/20/22 0556  Weight: 108 kg 112.4 kg 108.8 kg    Examination:  General exam: Appears calm and comfortable.  On room air. Respiratory system: Clear to auscultation. Respiratory effort normal.  No added sounds. Cardiovascular system: S1 & S2 heard, RRR.  No pedal edema. Gastrointestinal system: Soft.  Distended.  Mild diffuse tenderness.  Bowel sounds present.  Right lower quadrant ileostomy bag with liquid stool.  Midline incision clean and dry.  Staples intact. Central nervous system: Alert and oriented. No focal neurological deficits. Extremities: Symmetric 5 x 5 power. Skin: No rashes, lesions or ulcers Psychiatry: Judgement and insight appear normal. Mood & affect appropriate.     Data Reviewed: I have personally reviewed following labs and imaging studies  CBC: Recent Labs  Lab 04/16/22 0534 04/16/22 1007 04/17/22 0246 04/18/22 0254 04/19/22 0437 04/20/22 0412  WBC 15.1*  --  22.7* 23.1* 21.5* 18.3*  NEUTROABS  --   --   --   --  17.6* 14.8*  HGB 9.0*  9.1* 9.5* 8.9* 8.4* 8.5* 8.7*  HCT 28.0*  28.4* 29.1* 27.7* 26.7* 27.5* 28.4*  MCV 91.3  --  90.8 93.7 93.9 94.4  PLT 218  --  239 262 312 774   Basic Metabolic Panel: Recent Labs  Lab 04/14/22 0236 04/15/22 0410 04/16/22 0534 04/17/22 0246 04/18/22 0254 04/19/22 0437 04/20/22 0412  NA 139 141 139 140 139 141 135  K 3.5 3.1* 3.4* 3.4* 4.1 4.1 3.7  CL 104 106 106 106 107 107 101  CO2 '24 24 23 24 23 '$ 21* 22  GLUCOSE 113* 112* 113* 104* 109* 114* 114*  BUN '9 9 9 8 13 15 15  '$ CREATININE 1.01 0.93 0.92 0.89 0.95 0.91 0.90  CALCIUM 8.1* 8.0* 8.2* 8.0* 7.9* 8.0* 8.0*  MG  --   --   --   --   --  1.8 2.0  PHOS 3.7 3.0 3.0  --   --  2.6  --    GFR: Estimated Creatinine Clearance: 102.4 mL/min (by C-G formula based on SCr of 0.9 mg/dL). Liver Function Tests: No results for input(s): "AST", "ALT", "ALKPHOS", "BILITOT", "PROT", "ALBUMIN" in the last 168 hours. No  results for input(s): "LIPASE", "AMYLASE" in the last 168 hours. No results for input(s): "AMMONIA" in the last 168 hours. Coagulation Profile: No results for input(s): "INR", "PROTIME" in the last 168 hours. Cardiac Enzymes: No results for input(s): "CKTOTAL", "CKMB", "CKMBINDEX", "TROPONINI" in the last 168 hours. BNP (last 3 results) No results for input(s): "PROBNP" in the last 8760 hours. HbA1C: No results for input(s): "HGBA1C" in the last 72 hours. CBG: Recent Labs  Lab 04/19/22 1701 04/19/22 2044 04/20/22 0012 04/20/22 0435 04/20/22 0851  GLUCAP 105* 107* 114* 111* 106*   Lipid Profile: No results for input(s): "CHOL", "HDL", "LDLCALC", "TRIG", "CHOLHDL", "LDLDIRECT" in the last 72 hours. Thyroid Function Tests: No results for input(s): "TSH", "T4TOTAL", "FREET4", "T3FREE", "THYROIDAB" in the last 72 hours. Anemia Panel: No results for input(s): "VITAMINB12", "FOLATE", "FERRITIN", "TIBC", "IRON", "RETICCTPCT" in the last 72 hours. Sepsis Labs: Recent Labs  Lab 04/16/22 0534 04/17/22 0943  PROCALCITON 0.62 1.30    Recent Results (from the past 240 hour(s))  Culture, blood (Routine X 2) w Reflex to ID Panel  Status: None (Preliminary result)   Collection Time: 04/17/22  9:43 AM   Specimen: BLOOD  Result Value Ref Range Status   Specimen Description   Final    BLOOD BLOOD LEFT ARM Performed at [facility] [address], [place], [place] [address]    Special Requests   Final    BOTTLES DRAWN AEROBIC ONLY Blood Culture adequate volume Performed at [facility] [address], [place], [place] [address]    Culture   Final    NO GROWTH 3 DAYS Performed at [facility], [place] [address], [place], [place] [address]    Report Status PENDING  Incomplete  Culture, blood (Routine X 2) w Reflex to ID Panel     Status: None (Preliminary result)   Collection Time: 04/17/22  9:43 AM   Specimen: BLOOD  Result Value Ref  Range Status   Specimen Description   Final    BLOOD BLOOD LEFT FOREARM Performed at [facility] [address], [place], [place] [address]    Special Requests   Final    BOTTLES DRAWN AEROBIC ONLY Blood Culture adequate volume Performed at [facility] [address], [place], [place] [address]    Culture   Final    NO GROWTH 3 DAYS Performed at [facility], [place] [address], [place], [place] [address]    Report Status PENDING  Incomplete  Urine Culture     Status: Abnormal   Collection Time: 04/17/22  1:52 PM   Specimen: Urine, Clean Catch  Result Value Ref Range Status   Specimen Description   Final    URINE, CLEAN CATCH Performed at [facility], [place] [address], [place], [place] [address]    Special Requests   Final    NONE Performed at [facility], [place] [address], [place],  [address]    Culture >=100,000 COLONIES/mL ENTEROBACTER CLOACAE (A)  Final   Report Status 04/19/2022 FINAL  Final   Organism ID, Bacteria ENTEROBACTER CLOACAE (A)  Final      Susceptibility   Enterobacter cloacae - MIC*    CEFAZOLIN >=64 RESISTANT Resistant     CEFEPIME <=0.12 SENSITIVE Sensitive     CIPROFLOXACIN <=0.25 SENSITIVE Sensitive     GENTAMICIN <=1 SENSITIVE Sensitive     IMIPENEM <=0.25 SENSITIVE Sensitive     NITROFURANTOIN 64 INTERMEDIATE Intermediate     TRIMETH/SULFA <=20 SENSITIVE Sensitive     PIP/TAZO <=4 SENSITIVE Sensitive     * >=100,000 COLONIES/mL ENTEROBACTER CLOACAE         Radiology Studies: No results found.      Scheduled Meds:  acetaminophen  650 mg Oral Q6H   amLODipine  10 mg Oral Daily   apixaban  5 mg Oral BID   insulin aspart  0-9 Units Subcutaneous Q4H   levothyroxine  50 mcg Oral QAC breakfast   metoprolol tartrate  50 mg Oral BID   multivitamin with minerals  1 tablet Oral Daily   pantoprazole  40 mg Oral Daily   polycarbophil  625 mg Oral BID    Ensure Max Protein  11 oz Oral BID   Continuous Infusions:  sodium chloride Stopped (04/15/22 1829)     LOS: 12 days    Time spent: 35 minutes    Barb Merino, MD [facility] Pager [phone]

## 2022-04-20 NOTE — Plan of Care (Signed)

## 2022-04-20 NOTE — Progress Notes (Addendum)
      [facility] for Infectious Disease    Date of Admission:  04/08/2022     ID: Kyle Delacruz is a 66 y.o. male with   Principal Problem:   PE (pulmonary thromboembolism) ([place]) Active Problems:   Hypothyroidism   Essential hypertension   Gastro-esophageal reflux disease without esophagitis   Obstructive sleep apnea (adult) (pediatric)   Acute respiratory failure with hypoxia (HCC)   Leg DVT (deep venous thromboembolism), chronic, right (HCC)   Abdominal distension   Deep vein thrombosis (DVT) of femoral vein of right lower extremity (HCC)   Normocytic anemia   Shock (HCC)    Subjective: - no fever, - still some abdominal distention - feels that morning pills stuck in throat  Medications:   acetaminophen  650 mg Oral Q6H   amLODipine  10 mg Oral Daily   apixaban  5 mg Oral BID   insulin aspart  0-9 Units Subcutaneous Q4H   levothyroxine  50 mcg Oral QAC breakfast   metoprolol tartrate  50 mg Oral BID   multivitamin with minerals  1 tablet Oral Daily   pantoprazole  40 mg Oral Daily   polycarbophil  625 mg Oral BID   Ensure Max Protein  11 oz Oral BID    Objective: Vital signs in last 24 hours: Temp:  [98.1 F (36.7 C)-98.4 F (36.9 C)] 98.3 F (36.8 C) (12/08 1501) Pulse Rate:  [97-120] 97 (12/08 1501) Resp:  [18-20] 20 (12/08 1501) BP: (96-121)/(64-83) 120/64 (12/08 1501) SpO2:  [92 %-98 %] 98 % (12/08 1501) Weight:  [108.8 kg] 108.8 kg (12/08 0556)  Physical Exam  Constitutional: He is oriented to person, place, and time. He appears well-developed and well-nourished. No distress.  HENT:  Mouth/Throat: Oropharynx is clear and moist. No oropharyngeal exudate.  Cardiovascular: Normal rate, regular rhythm and normal heart sounds. Exam reveals no gallop and no friction rub.  No murmur heard.  Pulmonary/Chest: Effort normal and breath sounds normal. No respiratory distress. He has no wheezes.  Abdominal: Soft. Bowel sounds are normal.+ distension.  Ostomy with brown liquid. There is no tenderness.  Lymphadenopathy:  He has no cervical adenopathy.  Neurological: He is alert and oriented to person, place, and time.  Skin: Skin is warm and dry. No rash noted. No erythema.  Psychiatric: He has a normal mood and affect. His behavior is normal.    Lab Results Recent Labs    04/19/22 0437 04/20/22 0412  WBC 21.5* 18.3*  HGB 8.5* 8.7*  HCT 27.5* 28.4*  NA 141 135  K 4.1 3.7  CL 107 101  CO2 21* 22  BUN 15 15  CREATININE 0.91 0.90   Liver Panel No results for input(s): "PROT", "ALBUMIN", "AST", "ALT", "ALKPHOS", "BILITOT", "BILIDIR", "IBILI" in the last 72 hours. Sedimentation Rate No results for input(s): "ESRSEDRATE" in the last 72 hours. C-Reactive Protein No results for input(s): "CRP" in the last 72 hours.  Microbiology: reviewed Studies/Results: No results found.   Assessment/Plan: Continue with supportive care Leukocytosis trending downward. No dysuria, would not treat urine cx data at this time.  [facility] for Infectious Diseases Pager: [phone]  04/20/2022, 5:17 PM

## 2022-04-21 DIAGNOSIS — I2699 Other pulmonary embolism without acute cor pulmonale: Secondary | ICD-10-CM | POA: Diagnosis not present

## 2022-04-21 DIAGNOSIS — K5981 Ogilvie syndrome: Secondary | ICD-10-CM | POA: Insufficient documentation

## 2022-04-21 DIAGNOSIS — Z7901 Long term (current) use of anticoagulants: Secondary | ICD-10-CM

## 2022-04-21 DIAGNOSIS — R198 Other specified symptoms and signs involving the digestive system and abdomen: Secondary | ICD-10-CM

## 2022-04-21 LAB — CBC WITH DIFFERENTIAL/PLATELET
Abs Immature Granulocytes: 0.17 10*3/uL — ABNORMAL HIGH (ref 0.00–0.07)
Basophils Absolute: 0 10*3/uL (ref 0.0–0.1)
Basophils Relative: 0 %
Eosinophils Absolute: 0.2 10*3/uL (ref 0.0–0.5)
Eosinophils Relative: 1 %
HCT: 28.5 % — ABNORMAL LOW (ref 39.0–52.0)
Hemoglobin: 8.9 g/dL — ABNORMAL LOW (ref 13.0–17.0)
Immature Granulocytes: 1 %
Lymphocytes Relative: 10 %
Lymphs Abs: 1.8 10*3/uL (ref 0.7–4.0)
MCH: 28.9 pg (ref 26.0–34.0)
MCHC: 31.2 g/dL (ref 30.0–36.0)
MCV: 92.5 fL (ref 80.0–100.0)
Monocytes Absolute: 1.3 10*3/uL — ABNORMAL HIGH (ref 0.1–1.0)
Monocytes Relative: 7 %
Neutro Abs: 14.3 10*3/uL — ABNORMAL HIGH (ref 1.7–7.7)
Neutrophils Relative %: 81 %
Platelets: 442 10*3/uL — ABNORMAL HIGH (ref 150–400)
RBC: 3.08 MIL/uL — ABNORMAL LOW (ref 4.22–5.81)
RDW: 17.1 % — ABNORMAL HIGH (ref 11.5–15.5)
WBC: 17.7 10*3/uL — ABNORMAL HIGH (ref 4.0–10.5)
nRBC: 0 % (ref 0.0–0.2)

## 2022-04-21 LAB — BASIC METABOLIC PANEL
Anion gap: 12 (ref 5–15)
BUN: 13 mg/dL (ref 8–23)
CO2: 26 mmol/L (ref 22–32)
Calcium: 8.5 mg/dL — ABNORMAL LOW (ref 8.9–10.3)
Chloride: 99 mmol/L (ref 98–111)
Creatinine, Ser: 0.89 mg/dL (ref 0.61–1.24)
GFR, Estimated: >60 mL/min (ref >60)
Glucose, Bld: 119 mg/dL — ABNORMAL HIGH (ref 70–99)
Potassium: 3.6 mmol/L (ref 3.5–5.1)
Sodium: 137 mmol/L (ref 135–145)

## 2022-04-21 LAB — GLUCOSE, CAPILLARY
Glucose-Capillary: 100 mg/dL — ABNORMAL HIGH (ref 70–99)
Glucose-Capillary: 111 mg/dL — ABNORMAL HIGH (ref 70–99)
Glucose-Capillary: 99 mg/dL (ref 70–99)
Glucose-Capillary: 117 mg/dL — ABNORMAL HIGH (ref 70–99)
Glucose-Capillary: 120 mg/dL — ABNORMAL HIGH (ref 70–99)

## 2022-04-21 MED ORDER — MAGIC MOUTHWASH: 15.0000 mL | Freq: Four times a day (QID) | ORAL | Status: DC | PRN

## 2022-04-21 MED ORDER — LOPERAMIDE HCL 2 MG PO CAPS
4.0000 mg | ORAL_CAPSULE | Freq: Three times a day (TID) | ORAL | Status: DC
  Administered 2022-04-21 – 2022-04-22 (×4): 4 mg via ORAL
  Filled 2022-04-21 (×4): qty 2

## 2022-04-21 MED ORDER — LACTATED RINGERS IV BOLUS: 1000.0000 mL | Freq: Three times a day (TID) | INTRAVENOUS | Status: DC | PRN

## 2022-04-21 MED ORDER — LIP MEDEX EX OINT
TOPICAL_OINTMENT | Freq: Two times a day (BID) | CUTANEOUS | Status: DC
  Administered 2022-04-21: 75 via TOPICAL
  Filled 2022-04-21 (×2): qty 7

## 2022-04-21 MED ORDER — FERROUS SULFATE 325 (65 FE) MG PO TABS
325.0000 mg | ORAL_TABLET | Freq: Two times a day (BID) | ORAL | Status: DC
  Administered 2022-04-21 – 2022-04-22 (×3): 325 mg via ORAL
  Filled 2022-04-21 (×3): qty 1

## 2022-04-21 NOTE — Progress Notes (Signed)
Mobility Specialist - Progress Note   04/21/22 1314  Mobility  Activity Ambulated with assistance in hallway  Level of Assistance Minimal assist, patient does 75% or more  Assistive Device Front wheel walker  Distance Ambulated (ft) 120 ft  Range of Motion/Exercises Active  Activity Response Tolerated well  Mobility Referral Yes  $Mobility charge 1 Mobility   Pt was found in bed and agreeable to ambulate. C/o R hip pain before beginning ambulation. Was min-A to get out of bed and stand by for ambulation. At EOS returned to recliner chair with necessities in reach.  Ferd Hibbs Mobility Specialist

## 2022-04-21 NOTE — Plan of Care (Signed)
  Problem: Clinical Measurements: Goal: Diagnostic test results will improve Outcome: Progressing   Problem: Activity: Goal: Risk for activity intolerance will decrease Outcome: Progressing   Problem: Safety: Goal: Ability to remain free from injury will improve Outcome: Progressing   

## 2022-04-21 NOTE — Progress Notes (Signed)
PROGRESS NOTE    Kyle Delacruz  WFU:[id number] DOB: 1956/04/14 DOA: 04/08/2022 PCP: Prince Solian, MD    Brief Narrative:  Patient is a 66 year old male with provoked DVT who presented after lumbar surgery with worsening shortness of breath and RLE swelling. 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  Echo/CTA showing some RV strain. Hospital course complicated by severe constipation vs. colonic ileus partially relieved with enema 04/10/22.  He developed searing epigastric pain with radiation to shoulder, and worsened with movement after enema. He subsequently developed worsening hypoxia, tachycardia and hypotension requiring intubation on 11/28.  CT results showed a large pericolonic hematoma tracking into the peritoneum and right hemiliver.  Mass transfusion protocol was activated and he was evaluated by surgery and underwent emergency ex lap with total colectomy and end ileostomy on 04/10/2022.  Started on IV heparin on 11/29 He was extubated on 11/30 and weaned off of vasopressors on 12/1 12/8, went into A-fib overnight.  No previous history of A-fib.  Mostly with atrial tachycardia and PACs.  Already therapeutic on Eliquis. 12/9, with increasing ileostomy output.   Assessment & Plan:   Acute submassive pulmonary embolism with hemorrhagic shock: Right lower extremity DVT: Initially on Eliquis after surgery, developed massive hemorrhage.  Controlled and perioperative heparin.    Currently on Eliquis and tolerating well.  Plan to discharge on Eliquis without stopping for the time being.    Acute respiratory failure with hypoxemia: Resolved.  On room air.  Extubated on 11/30.   Mobility and chest physiotherapy.  Improved.  Post colonic ileus, acute blood loss anemia with intra-abdominal hematoma: Status post ex lap. Ex lap, total colectomy and end ileostomy 11/28.  Currently tolerating low fiber diet.  Postop management as per surgery. Now with increasing ostomy output, more than 2400 mL last 24  hours.  Started on Imodium 2 mg every 8 hours.  Monitor for dehydration and oral intake.  Clinically improving.  Leukocytosis, downtrending without evidence of infection.  Monitor.   Blood cultures, negative so far. Urinalysis, more than 100,000 colonies of Enterobacter cloacae.  Not treating with antibiotics as symptomatically improving.  Hypothyroidism: Synthroid.  Essential hypertension: Blood pressure stable on Norvasc and metoprolol.  GERD: PPI.  New onset A-fib: Asymptomatic.  Therapeutic on Eliquis.  Increase metoprolol to 50 mg twice daily.  Tolerating.  Still tolerating amlodipine.  Blood pressures are stable.    Continue to increase mobility. Anticipate discharge home when surgically stable.   DVT prophylaxis: Place TED hose Start: 04/09/22 0021 apixaban (ELIQUIS) tablet 5 mg   Code Status: Full code Family Communication: Wife on the phone. Disposition Plan: Status is: Inpatient Remains inpatient appropriate because: Surgically not clear.   Consultants:  Critical care General surgery Infectious disease Cardiology, curbside  Procedures:  Multiple procedures as above  Antimicrobials:  Currently without antibiotics   Subjective: Seen and examined.  Upset being in the hospital and unable to go home.  Discussed with patient's wife.  She had a lot of nursing care concerns and addressed.  Patient denies any difficulty swallowing.  Denies any nausea vomiting.  Mild pain in the abdomen. Really wants to go home.  Objective: Vitals:   04/20/22 1347 04/20/22 1501 04/20/22 2004 04/21/22 0407  BP:  120/64 118/64 115/61  Pulse:  97 94 93  Resp: '20 20 19 18  '$ Temp:  98.3 F (36.8 C) 98.2 F (36.8 C) 98.6 F (37 C)  TempSrc:  Oral Oral Oral  SpO2:  98% 95% 96%  Weight:  Height:        Intake/Output Summary (Last 24 hours) at 04/21/2022 1158 Last data filed at 04/21/2022 1135 Gross per 24 hour  Intake --  Output 3825 ml  Net -3825 ml   Filed Weights    04/18/22 0500 04/19/22 0330 04/20/22 0556  Weight: 108 kg 112.4 kg 108.8 kg    Examination:  General exam: Appears calm and comfortable.  On room air.  Flat affect today. Respiratory system: Clear to auscultation. Respiratory effort normal.  No added sounds. Cardiovascular system: S1 & S2 heard, RRR.  No pedal edema. Gastrointestinal system: Soft.  Distended.  Mild diffuse tenderness.  Bowel sounds present.  Right lower quadrant ileostomy bag full with liquid stool.  Midline incision clean and dry.  Staples intact. Central nervous system: Alert and oriented. No focal neurological deficits. Extremities: Symmetric 5 x 5 power. Skin: No rashes, lesions or ulcers    Data Reviewed: I have personally reviewed following labs and imaging studies  CBC: Recent Labs  Lab 04/17/22 0246 04/18/22 0254 04/19/22 0437 04/20/22 0412 04/21/22 0758  WBC 22.7* 23.1* 21.5* 18.3* 17.7*  NEUTROABS  --   --  17.6* 14.8* 14.3*  HGB 8.9* 8.4* 8.5* 8.7* 8.9*  HCT 27.7* 26.7* 27.5* 28.4* 28.5*  MCV 90.8 93.7 93.9 94.4 92.5  PLT 239 262 312 369 527*   Basic Metabolic Panel: Recent Labs  Lab 04/15/22 0410 04/16/22 0534 04/17/22 0246 04/18/22 0254 04/19/22 0437 04/20/22 0412 04/21/22 0758  NA 141 139 140 139 141 135 137  K 3.1* 3.4* 3.4* 4.1 4.1 3.7 3.6  CL 106 106 106 107 107 101 99  CO2 '24 23 24 23 '$ 21* 22 26  GLUCOSE 112* 113* 104* 109* 114* 114* 119*  BUN '9 9 8 13 15 15 13  '$ CREATININE 0.93 0.92 0.89 0.95 0.91 0.90 0.89  CALCIUM 8.0* 8.2* 8.0* 7.9* 8.0* 8.0* 8.5*  MG  --   --   --   --  1.8 2.0  --   PHOS 3.0 3.0  --   --  2.6  --   --    GFR: Estimated Creatinine Clearance: 103.5 mL/min (by C-G formula based on SCr of 0.89 mg/dL). Liver Function Tests: No results for input(s): "AST", "ALT", "ALKPHOS", "BILITOT", "PROT", "ALBUMIN" in the last 168 hours. No results for input(s): "LIPASE", "AMYLASE" in the last 168 hours. No results for input(s): "AMMONIA" in the last 168  hours. Coagulation Profile: No results for input(s): "INR", "PROTIME" in the last 168 hours. Cardiac Enzymes: No results for input(s): "CKTOTAL", "CKMB", "CKMBINDEX", "TROPONINI" in the last 168 hours. BNP (last 3 results) No results for input(s): "PROBNP" in the last 8760 hours. HbA1C: No results for input(s): "HGBA1C" in the last 72 hours. CBG: Recent Labs  Lab 04/20/22 2002 04/20/22 2356 04/21/22 0402 04/21/22 0722 04/21/22 1125  GLUCAP 93 101* 100* 111* 99   Lipid Profile: No results for input(s): "CHOL", "HDL", "LDLCALC", "TRIG", "CHOLHDL", "LDLDIRECT" in the last 72 hours. Thyroid Function Tests: No results for input(s): "TSH", "T4TOTAL", "FREET4", "T3FREE", "THYROIDAB" in the last 72 hours. Anemia Panel: No results for input(s): "VITAMINB12", "FOLATE", "FERRITIN", "TIBC", "IRON", "RETICCTPCT" in the last 72 hours. Sepsis Labs: Recent Labs  Lab 04/16/22 0534 04/17/22 0943  PROCALCITON 0.62 1.30    Recent Results (from the past 240 hour(s))  Culture, blood (Routine X 2) w Reflex to ID Panel     Status: None (Preliminary result)   Collection Time: 04/17/22  9:43 AM   Specimen:  BLOOD  Result Value Ref Range Status   Specimen Description   Final    BLOOD BLOOD LEFT ARM Performed at [facility] [address], [place], [place] [address]    Special Requests   Final    BOTTLES DRAWN AEROBIC ONLY Blood Culture adequate volume Performed at [facility] [address] [place], [place] [address]    Culture   Final    NO GROWTH 4 DAYS Performed at [facility], [place] [address], [place], [place] [address]    Report Status PENDING  Incomplete  Culture, blood (Routine X 2) w Reflex to ID Panel     Status: None (Preliminary result)   Collection Time: 04/17/22  9:43 AM   Specimen: BLOOD  Result Value Ref Range Status   Specimen Description   Final    BLOOD BLOOD LEFT FOREARM Performed at [facility] [address] [place], [place] [address]    Special Requests   Final    BOTTLES DRAWN AEROBIC ONLY Blood Culture adequate volume Performed at [facility] [address] [place], [place] [address]    Culture   Final    NO GROWTH 4 DAYS Performed at [facility], [place] [address] [place], [place] [address]    Report Status PENDING  Incomplete  Urine Culture     Status: Abnormal   Collection Time: 04/17/22  1:52 PM   Specimen: Urine, Clean Catch  Result Value Ref Range Status   Specimen Description   Final    URINE, CLEAN CATCH Performed at [facility], [place] [address], [place], [place] [address]    Special Requests   Final    NONE Performed at [facility], [place] [address], [place], [place] [address]    Culture >=100,000 COLONIES/mL ENTEROBACTER CLOACAE (A)  Final   Report Status 04/19/2022 FINAL  Final   Organism ID, Bacteria ENTEROBACTER CLOACAE (A)  Final      Susceptibility   Enterobacter cloacae - MIC*    CEFAZOLIN >=64 RESISTANT Resistant     CEFEPIME <=0.12 SENSITIVE Sensitive     CIPROFLOXACIN <=0.25 SENSITIVE Sensitive     GENTAMICIN <=1 SENSITIVE Sensitive     IMIPENEM <=0.25 SENSITIVE Sensitive     NITROFURANTOIN 64 INTERMEDIATE Intermediate     TRIMETH/SULFA <=20 SENSITIVE Sensitive     PIP/TAZO <=4 SENSITIVE Sensitive     * >=100,000 COLONIES/mL ENTEROBACTER CLOACAE         Radiology Studies: No results found.      Scheduled Meds:  acetaminophen  650 mg Oral Q6H   amLODipine  10 mg Oral Daily   apixaban  5 mg Oral BID   ferrous sulfate  325 mg Oral BID WC   insulin aspart  0-9 Units Subcutaneous Q4H   levothyroxine  50 mcg Oral QAC breakfast   lip balm   Topical BID   loperamide  4 mg Oral Q8H   metoprolol tartrate  50 mg Oral BID   multivitamin with minerals  1 tablet Oral Daily   pantoprazole  40 mg Oral Daily   polycarbophil  625 mg Oral BID   Ensure Max Protein  11 oz Oral BID    Continuous Infusions:  sodium chloride Stopped (04/15/22 1829)   lactated ringers       LOS: 13 days    Time spent: 35 minutes    Barb Merino, MD [facility] Pager [phone]

## 2022-04-21 NOTE — Progress Notes (Signed)
11 Days Post-Op   Subjective/Chief Complaint: Patient feels well Had 2.5 liters of ostomy output yesterday WBC slightly decreased, but still elevated Afebrile   Objective: Vital signs in last 24 hours: Temp:  [98.2 F (36.8 C)-98.6 F (37 C)] 98.6 F (37 C) (12/09 0407) Pulse Rate:  [93-120] 93 (12/09 0407) Resp:  [18-20] 18 (12/09 0407) BP: (96-120)/(61-83) 115/61 (12/09 0407) SpO2:  [95 %-98 %] 96 % (12/09 0407) Last BM Date : 04/19/22  Intake/Output from previous day: 12/08 0701 - 12/09 0700 In: 450 [P.O.:450] Out: 3100 [Urine:650; Stool:2450] Intake/Output this shift: No intake/output data recorded.  WDWN in NAD Abd - soft, minimally tender Incision c/d/I; staples intact Ostomy bag full of liquid stool/ gas - pulling away from the skin, resulting in leak  Lab Results:  Recent Labs    04/20/22 0412 04/21/22 0758  WBC 18.3* 17.7*  HGB 8.7* 8.9*  HCT 28.4* 28.5*  PLT 369 442*   BMET Recent Labs    04/19/22 0437 04/20/22 0412  NA 141 135  K 4.1 3.7  CL 107 101  CO2 21* 22  GLUCOSE 114* 114*  BUN 15 15  CREATININE 0.91 0.90  CALCIUM 8.0* 8.0*   PT/INR No results for input(s): "LABPROT", "INR" in the last 72 hours. ABG No results for input(s): "PHART", "HCO3" in the last 72 hours.  Invalid input(s): "PCO2", "PO2"  Studies/Results: No results found.  Anti-infectives: Anti-infectives (From admission, onward)    Start     Dose/Rate Route Frequency Ordered Stop   04/10/22 2107  metroNIDAZOLE (FLAGYL) 500 MG/100ML IVPB       Note to Pharmacy: Enis Gash W: cabinet override      04/10/22 2107 04/10/22 2114   04/10/22 2103  ceFAZolin (ANCEF) 2-4 GM/100ML-% IVPB       Note to Pharmacy: Enis Gash W: cabinet override      04/10/22 2103 04/11/22 0914       Assessment/Plan:  Colonic ileus with hemoperitoneum  POD 11 S/P exploratory laparotomy, total abdominal colectomy, end ileostomy Dr. Thermon Leyland 04/10/22 - Advance to regular diet.  Continue Ensures - unsure cause of leukocytosis and prior fevers - wound remains stable/healthy on exam, down to 18 today, continue to follow - ileostomy functioning - high output (2450 ml/24H) Add scheduled Imodium to try to slow outpu - continue to mobilize   Dispo: Not ready for discharge until his ileostomy output is under control and he can maintain adequate hydration.     FEN: regular VTE: heparin gtt >> now on eliquis ID: ancef/flagyl pre-op   - below per TRH -  Acute submassive PE/RLE DVT Hemorrhagic shock, ABL anemia - hgb stable Acute respiratory failure with hypoxia Hypothyroidism HTN GERD Hyperglycemia  Obesity class I  LOS: 13 days    Kyle Delacruz 04/21/2022

## 2022-04-22 DIAGNOSIS — I2699 Other pulmonary embolism without acute cor pulmonale: Secondary | ICD-10-CM | POA: Diagnosis not present

## 2022-04-22 LAB — CBC WITH DIFFERENTIAL/PLATELET
Abs Immature Granulocytes: 0.3 10*3/uL — ABNORMAL HIGH (ref 0.00–0.07)
Basophils Absolute: 0 10*3/uL (ref 0.0–0.1)
Basophils Relative: 0 %
Eosinophils Absolute: 0.2 10*3/uL (ref 0.0–0.5)
Eosinophils Relative: 1 %
HCT: 27.9 % — ABNORMAL LOW (ref 39.0–52.0)
Hemoglobin: 8.7 g/dL — ABNORMAL LOW (ref 13.0–17.0)
Immature Granulocytes: 2 %
Lymphocytes Relative: 11 %
Lymphs Abs: 1.8 10*3/uL (ref 0.7–4.0)
MCH: 28.9 pg (ref 26.0–34.0)
MCHC: 31.2 g/dL (ref 30.0–36.0)
MCV: 92.7 fL (ref 80.0–100.0)
Monocytes Absolute: 1.1 10*3/uL — ABNORMAL HIGH (ref 0.1–1.0)
Monocytes Relative: 7 %
Neutro Abs: 12 10*3/uL — ABNORMAL HIGH (ref 1.7–7.7)
Neutrophils Relative %: 79 %
Platelets: 442 10*3/uL — ABNORMAL HIGH (ref 150–400)
RBC: 3.01 MIL/uL — ABNORMAL LOW (ref 4.22–5.81)
RDW: 16.9 % — ABNORMAL HIGH (ref 11.5–15.5)
WBC: 15.4 10*3/uL — ABNORMAL HIGH (ref 4.0–10.5)
nRBC: 0 % (ref 0.0–0.2)

## 2022-04-22 LAB — CULTURE, BLOOD (ROUTINE X 2)
Culture: NO GROWTH
Culture: NO GROWTH
Special Requests: ADEQUATE
Special Requests: ADEQUATE

## 2022-04-22 LAB — GLUCOSE, CAPILLARY
Glucose-Capillary: 114 mg/dL — ABNORMAL HIGH (ref 70–99)
Glucose-Capillary: 126 mg/dL — ABNORMAL HIGH (ref 70–99)
Glucose-Capillary: 109 mg/dL — ABNORMAL HIGH (ref 70–99)

## 2022-04-22 MED ORDER — CALCIUM POLYCARBOPHIL 625 MG PO TABS: 625.0000 mg | ORAL_TABLET | Freq: Two times a day (BID) | ORAL | 0 refills | Status: AC

## 2022-04-22 MED ORDER — APIXABAN 5 MG PO TABS: 5.0000 mg | ORAL_TABLET | Freq: Two times a day (BID) | ORAL | 0 refills | Status: DC

## 2022-04-22 MED ORDER — ALBUTEROL SULFATE HFA 108 (90 BASE) MCG/ACT IN AERS: 2.0000 | INHALATION_SPRAY | Freq: Four times a day (QID) | RESPIRATORY_TRACT | 2 refills | Status: AC | PRN

## 2022-04-22 MED ORDER — METOPROLOL TARTRATE 50 MG PO TABS: 50.0000 mg | ORAL_TABLET | Freq: Two times a day (BID) | ORAL | 0 refills | Status: DC

## 2022-04-22 MED ORDER — LOPERAMIDE HCL 2 MG PO CAPS: 4.0000 mg | ORAL_CAPSULE | Freq: Three times a day (TID) | ORAL | 0 refills | Status: DC | PRN

## 2022-04-22 MED ORDER — OXYCODONE HCL 5 MG PO TABS: 5.0000 mg | ORAL_TABLET | Freq: Four times a day (QID) | ORAL | 0 refills | Status: DC | PRN

## 2022-04-22 NOTE — Progress Notes (Signed)
12 Days Post-Op   Subjective/Chief Complaint: Output is significantly decreased, thicker in consistency Feels very well WBC down to 15.4 Afebrile  Objective: Vital signs in last 24 hours: Temp:  [97.7 F (36.5 C)-98.6 F (37 C)] 98 F (36.7 C) (12/10 0430) Pulse Rate:  [72-103] 86 (12/10 0430) Resp:  [17-20] 17 (12/10 0430) BP: (110-124)/(64-71) 112/64 (12/10 0430) SpO2:  [98 %-100 %] 98 % (12/10 0430) Weight:  [106.3 kg] 106.3 kg (12/10 0441) Last BM Date : 04/21/22  Intake/Output from previous day: 12/09 0701 - 12/10 0700 In: -  Out: 2525 [Urine:900; UKGUR:4270] Intake/Output this shift: No intake/output data recorded.  WDWN in NAD Abd - soft, minimal tenderness Incision c/d/I - staples intact Ostomy - pink; pasty output  Lab Results:  Recent Labs    04/21/22 0758 04/22/22 0345  WBC 17.7* 15.4*  HGB 8.9* 8.7*  HCT 28.5* 27.9*  PLT 442* 442*   BMET Recent Labs    04/20/22 0412 04/21/22 0758  NA 135 137  K 3.7 3.6  CL 101 99  CO2 22 26  GLUCOSE 114* 119*  BUN 15 13  CREATININE 0.90 0.89  CALCIUM 8.0* 8.5*   PT/INR No results for input(s): "LABPROT", "INR" in the last 72 hours. ABG No results for input(s): "PHART", "HCO3" in the last 72 hours.  Invalid input(s): "PCO2", "PO2"  Studies/Results: No results found.  Anti-infectives: Anti-infectives (From admission, onward)    Start     Dose/Rate Route Frequency Ordered Stop   04/10/22 2107  metroNIDAZOLE (FLAGYL) 500 MG/100ML IVPB       Note to Pharmacy: Enis Gash W: cabinet override      04/10/22 2107 04/10/22 2114   04/10/22 2103  ceFAZolin (ANCEF) 2-4 GM/100ML-% IVPB       Note to Pharmacy: Enis Gash W: cabinet override      04/10/22 2103 04/11/22 0914       Assessment/Plan: Colonic ileus with hemoperitoneum  POD 12 S/P exploratory laparotomy, total abdominal colectomy, end ileostomy Dr. Thermon Leyland 04/10/22 - Advance to regular diet. Continue Ensures  - ileostomy  functioning - decreased output; PRN Imodium to avoid high output - continue to mobilize   Dispo: Ready for discharge.  PRN imodium/ hydration discussed with the patient and his wife  Remove staples today prior to discharge.   FEN: regular VTE: heparin gtt >> now on eliquis ID: ancef/flagyl pre-op   - below per TRH -  Acute submassive PE/RLE DVT Hemorrhagic shock, ABL anemia - hgb stable Acute respiratory failure with hypoxia Hypothyroidism HTN GERD Hyperglycemia  Obesity class I  LOS: 14 days    Maia Petties 04/22/2022

## 2022-04-22 NOTE — Progress Notes (Signed)
Abdominal staples removed and placed steri strips.

## 2022-04-22 NOTE — TOC Transition Note (Signed)
Transition of Care [facility]) - CM/SW Discharge Note   Patient Details  Name: Kyle Delacruz MRN: [id number] Date of Birth: 26-Sep-1955  Transition of Care [facility]) CM/SW Contact:  Henrietta Dine, RN Phone Number: 04/22/2022, 10:25 AM   Clinical Narrative:    D/C orders received; pt has HHPT/OT set up w/ [facility]; notified Wardell Honour at agency of d/c; no TOC needs.   Final next level of care: [place] Barriers to Discharge: No Barriers Identified   Patient Goals and CMS Choice Patient states their goals for this hospitalization and ongoing recovery are::  (Home)   Choice offered to / list presented to : Spouse  Discharge Placement                       Discharge Plan and Services   Discharge Planning Services: CM Consult Post Acute Care Choice: Home Health                    HH Arranged: RN, PT, OT [facility] Agency: [place] Date [place]: 04/18/22 Time [place]: [place] Representative spoke with at [place]:  Claiborne Billings)  Social Determinants of Health ([place]) Interventions     Readmission Risk Interventions     No data to display

## 2022-04-22 NOTE — Discharge Summary (Signed)
Physician Discharge Summary  Kyle Delacruz VPX:[id number] DOB: 1956/04/21 DOA: 04/08/2022  PCP: Prince Solian, MD  Admit date: 04/08/2022 Discharge date: 04/22/2022  Admitted From: Home Disposition: Home with home health  Recommendations for Outpatient Follow-up:  Follow up with PCP in 1-2 weeks Please obtain BMP/CBC in one week Surgery to schedule follow-up  Home Health: PT/OT/RN Equipment/Devices: Ostomy supplies  Discharge Condition: Stable CODE STATUS: Full code Diet recommendation: Regular diet  Discharge Summary: Patient is a 66 year old male with provoked DVT who presented after lumbar surgery with worsening shortness of breath and RLE swelling. 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  Echo/CTA showing some RV strain. Hospital course complicated by severe constipation vs. colonic ileus partially relieved with enema 04/10/22.  He developed searing epigastric pain with radiation to shoulder, and worsened with movement after enema. He subsequently developed worsening hypoxia, tachycardia and hypotension requiring intubation on 11/28.  Remained in the hospital due to complications from bleeding.  CT results showed a large pericolonic hematoma tracking into the peritoneum and right hemiliver.  Massive transfusion protocol was activated and he underwent emergency ex lap with total colectomy and end ileostomy on 04/10/2022.  Started on IV heparin on 11/29.  He was extubated on 11/30 and weaned off of vasopressors on 12/1 12/8, went into A-fib overnight.  No previous history of A-fib.  Mostly with atrial tachycardia and PACs.  Already therapeutic on Eliquis.  Surgically stabilized.  Now on oral medications including Eliquis.  Going home today.     Acute submassive pulmonary embolism with hemorrhagic shock: Right lower extremity DVT: Previously on anticoagulation.  He was off anticoagulation for 2 months. Heparin perioperative.  Currently tolerating Eliquis. On room air now.  Post colonic  ileus, acute blood loss anemia with intra-abdominal hematoma: Status post ex lap. Ex lap, total colectomy and end ileostomy 11/28.  Currently tolerating low fiber diet.   Imodium as needed to decrease output.  Oral intake is adequate.     Leukocytosis, downtrending without evidence of infection.  Monitor.   Blood cultures, negative so far. Urinalysis, more than 100,000 colonies of Enterobacter cloacae.  Not treating with antibiotics as symptomatically improving.   Hypothyroidism: Synthroid.  Resume on discharge.   Essential hypertension: No previous history of hypertension.  Initially on amlodipine.  Will discontinue.  Metoprolol for rate control.   GERD: PPI.   New onset A-fib: Asymptomatic.  Therapeutic on Eliquis.  metoprolol to 50 mg twice daily.  Tolerating.  Rate is controlled. Discharging home with metoprolol and Eliquis with rate control strategy.  Follow-up with A-fib clinic.   Patient medically stabilized to discharge home today.    Discharge Diagnoses:  Principal Problem:   PE (pulmonary thromboembolism) ([place]) Active Problems:   High output ileostomy (HCC)   Acute respiratory failure with hypoxia (HCC)   Leg DVT (deep venous thromboembolism), chronic, right (HCC)   Hypothyroidism   Essential hypertension   Gastro-esophageal reflux disease without esophagitis   Obstructive sleep apnea (adult) (pediatric)   Abdominal distension   Deep vein thrombosis (DVT) of femoral vein of right lower extremity (HCC)   Normocytic anemia   Shock (HCC)   Ogilvie syndrome   Chronic anticoagulation    Discharge Instructions  Discharge Instructions     Diet general   Complete by: As directed    Increase activity slowly   Complete by: As directed    No dressing needed   Complete by: As directed       Allergies as of 04/22/2022  Reactions   Bee Venom    Other reaction(s): Unknown   Lisinopril    Other reaction(s): Unknown   Metoclopramide Other (See Comments)    Other reaction(s): shaking too bad "Sweat like crazy"   Penicillins Hives   Did it involve swelling of the face/tongue/throat, SOB, or low BP? Yes Did it involve sudden or severe rash/hives, skin peeling, or any reaction on the inside of your mouth or nose? Yes Did you need to seek medical attention at a hospital or doctor's office? No When did it last happen?  childhood      If all above answers are "NO", may proceed with cephalosporin use.     Shellfish Allergy Swelling        Medication List     STOP taking these medications    cyclobenzaprine 10 MG tablet Commonly known as: FLEXERIL   enoxaparin 40 MG/0.4ML injection Commonly known as: LOVENOX   HYDROcodone-acetaminophen 5-325 MG tablet Commonly known as: NORCO/VICODIN       TAKE these medications    albuterol 108 (90 Base) MCG/ACT inhaler Commonly known as: VENTOLIN HFA Inhale 2 puffs into the lungs every 6 (six) hours as needed for wheezing or shortness of breath.   Align 4 MG Caps Take 1 capsule by mouth daily.   allopurinol 300 MG tablet Commonly known as: ZYLOPRIM Take 300 mg by mouth at bedtime.   apixaban 5 MG Tabs tablet Commonly known as: ELIQUIS Take 1 tablet (5 mg total) by mouth 2 (two) times daily.   Crestor 20 MG tablet Generic drug: rosuvastatin Take 20 mg by mouth daily.   fluticasone 50 MCG/ACT nasal spray Commonly known as: FLONASE INSTILL 2 SPRAYS IN EACH NOSTRIL DAILY   levothyroxine 50 MCG tablet Commonly known as: SYNTHROID Take 50 mcg by mouth daily.   loperamide 2 MG capsule Commonly known as: IMODIUM Take 2 capsules (4 mg total) by mouth every 8 (eight) hours as needed for diarrhea or loose stools.   metoprolol tartrate 50 MG tablet Commonly known as: LOPRESSOR Take 1 tablet (50 mg total) by mouth 2 (two) times daily.   omeprazole 40 MG capsule Commonly known as: PRILOSEC Take 40 mg by mouth 2 (two) times daily.   oxyCODONE 5 MG immediate release tablet Commonly  known as: Oxy IR/ROXICODONE Take 1 tablet (5 mg total) by mouth every 6 (six) hours as needed for moderate pain, severe pain or breakthrough pain.   oxyCODONE-acetaminophen 10-325 MG tablet Commonly known as: PERCOCET Take 1 tablet by mouth every 4 (four) hours as needed for pain.   polycarbophil 625 MG tablet Commonly known as: FIBERCON Take 1 tablet (625 mg total) by mouth 2 (two) times daily.               Discharge Care Instructions  (From admission, onward)           Start     Ordered   04/22/22 0000  No dressing needed        04/22/22 0830            Follow-up Information     [facility] Follow up.   Specialty: Home Health Services Why: [facility] nursing/physical/occupational therapy Contact information: [place] [address] [phone]         Stechschulte, Nickola Major, MD Follow up in 2 week(s).   Specialty: Surgery Contact information: [address] [place] [address] [phone]  Allergies  Allergen Reactions   Bee Venom     Other reaction(s): Unknown   Lisinopril     Other reaction(s): Unknown   Metoclopramide Other (See Comments)    Other reaction(s): shaking too bad "Sweat like crazy"    Penicillins Hives    Did it involve swelling of the face/tongue/throat, SOB, or low BP? Yes Did it involve sudden or severe rash/hives, skin peeling, or any reaction on the inside of your mouth or nose? Yes Did you need to seek medical attention at a hospital or doctor's office? No When did it last happen?  childhood      If all above answers are "NO", may proceed with cephalosporin use.     Shellfish Allergy Swelling    Consultations: Critical care General surgery   Procedures/Studies: DG CHEST PORT 1 VIEW  Result Date: 04/17/2022 CLINICAL DATA:  Fever. EXAM: PORTABLE CHEST 1 VIEW COMPARISON:  AP chest 04/12/2022 in 04/10/2022; CT chest 04/08/2022 FINDINGS: Interval  extubation and removal of enteric tube. Surgical clips overlie the right upper abdominal quadrant. Mildly decreased lung volumes. The lungs are clear. No pleural effusion or pneumothorax. Heart size is at the upper limits of normal. Mediastinal contours are within normal limits. Surgical clips overlie the left thyroid gland as seen on prior CT. Partial visualization of posterior right cervical spine laminoplasty hardware. IMPRESSION: Interval extubation and removal of enteric tube. No acute cardiopulmonary process. Electronically Signed   By: Yvonne Kendall M.D.   On: 04/17/2022 10:07   DG Abd Portable 2V  Result Date: 04/17/2022 CLINICAL DATA:  Fever EXAM: PORTABLE ABDOMEN - 2 VIEW COMPARISON:  Abdominal radiograph dated April 10, 2022 FINDINGS: Gaseous distention of multiple colonic loops suggesting ileus. No evidence of extraluminal free air. Surgical staples over the midabdomen and lumbar spine hardware is unchanged. Bilateral hip arthroplasty with intact hardware. IMPRESSION: Gaseous distention of multiple colonic loops suggesting ileus. Electronically Signed   By: Keane Police D.O.   On: 04/17/2022 09:57   DG CHEST PORT 1 VIEW  Result Date: 04/12/2022 CLINICAL DATA:  Hypoxia EXAM: PORTABLE CHEST 1 VIEW COMPARISON:  04/10/22 CXR FINDINGS: Endotracheal tube in place. Poor visualization of the carina makes it difficult to assess the location of the endotracheal tube. However, the tip subjectively appears lower compared to prior radiograph. Enteric tube courses below diaphragm with the tip out of the field of view and side hole poorly visualized. Left-sided central venous catheter with the tip terminating in the upper SVC. No pleural effusion. No pneumothorax. No new focal airspace opacity. There is persistent poor visualization of the diaphragm bilaterally, possibly secondary to a combination of small pleural effusions and atelectasis. Unchanged cardiac contours. Compared to prior exam there is a  lucency along the paramediastinal aspect of the right upper lung, which is nonspecific, and may represent the cuff of the endotracheal tube. IMPRESSION: 1. Poor visualization of the carina makes it difficult to assess the location of the endotracheal tube. However, the tip subjectively appears slightly lower compared to prior radiograph. 2. Persistent poor visualization of the diaphragm bilaterally, possibly secondary to a combination of small pleural effusions and atelectasis. Electronically Signed   By: Marin Roberts M.D.   On: 04/12/2022 08:20   CT ABDOMEN PELVIS WO CONTRAST  Result Date: 04/10/2022 CLINICAL DATA:  Abdominal distension and abdominal pain. EXAM: CT ABDOMEN AND PELVIS WITHOUT CONTRAST TECHNIQUE: Multidetector CT imaging of the abdomen and pelvis was performed following the standard protocol without IV contrast. RADIATION DOSE REDUCTION: This  exam was performed according to the departmental dose-optimization program which includes automated exposure control, adjustment of the mA and/or kV according to patient size and/or use of iterative reconstruction technique. COMPARISON:  April 08, 2022. FINDINGS: Lower chest: Basilar volume loss and more confluent airspace disease noted on the RIGHT as compared to the LEFT. Small RIGHT-sided pleural effusion. Signs of coronary artery disease. Central venous access device terminates at the caval to atrial junction. No chest wall abnormality. Hepatobiliary: Smooth hepatic contours. There is intermediate density fluid at 34 Hounsfield units along the RIGHT hemiliver, some areas slightly higher. This hemoperitoneum is most pronounced in the RIGHT upper quadrant. Abundant stranding adjacent to the RIGHT hemicolon. No gross pericholecystic stranding. Sludge or blood in the gallbladder. Pancreas: Pancreas with normal contours, no signs of inflammation or peripancreatic fluid. Spleen: Spleen normal size and contour. Adrenals/Urinary Tract: Adrenal glands are  normal. Renal cortical scarring bilaterally and signs of renal calculi without change. No signs of hydronephrosis. Urinary bladder is collapsed and difficult to evaluate given the presence of streak artifact from bilateral hip arthroplasty changes. Stomach/Bowel: Distension of the stomach. Gastric tube appears outside of the wall of the stomach on axials and coronal is a. sagittal imaging is equivocal. There is fluid adjacent to the stomach in the LEFT upper quadrant in the subdiaphragmatic space. The most abnormal finding on the current study is however a large hematoma with hematocrit in the RIGHT upper quadrant which displaces the colon anteriorly (image 62/4 and image 60/5. This measures 10.0 x 9.7 x 12.5 cm. Blood tracks over the RIGHT hemiliver as discussed. Blood appears confined to the anterior pararenal space and then tracks into the peritoneum along the margin of the RIGHT hemiliver. Colon remains distended with partially formed stool and is displaced anteriorly and medially by the large RIGHT abdominal hematoma which is likely centered in the anterior pararenal space and proximal transverse mesocolon adjacent to the hepatic flexure. There is also interval development of moderate fluid elsewhere that measures lower density but is a stents of Leigh related. Moderate fluid tracks into the pelvis. No sign of small bowel dilation. Vascular/Lymphatic: Normal caliber of the abdominal aorta. Calcified aortic atherosclerotic changes. No retroperitoneal hematoma adjacent to vascular structures. No gross intramuscular hematoma in the retroperitoneum in this patient on anticoagulation. Reproductive: Not well evaluated but grossly unremarkable by CT. Other: Hemoperitoneum and anterior pararenal/mesocolon hematoma. Musculoskeletal: No acute musculoskeletal findings. Spinal fusion extending from L2 through L5 as on previous imaging. Bilateral hip arthroplasty changes. Stranding in the body wall likely reflects  developing anasarca. IMPRESSION: 1. Large hematoma with hematocrit in the RIGHT upper quadrant which displaces the colon anteriorly and medially. This is likely centered in the anterior pararenal space and then tracks into the peritoneum along the margin of the RIGHT hemiliver with moderately large amount of blood over the RIGHT hemiliver and along the RIGHT hepatic margin. Would be difficult to exclude bleeding from the liver as there is some wave like added density along the inferior margin of the RIGHT hemiliver though the process appears to be centered more within the anterior pararenal space and mesocolon displacing the colon anteriorly. Given displacement and large size of the pericolonic hematoma vascular compromise as a result of mass effect is a concern. 2. Gastric tube appears outside of the wall of the stomach on axials and coronal, also with an abnormal appearance on sagittal imaging though assessment limited by adjacent fluid in the LEFT subdiaphragmatic space. Given potential extraluminal position the possibility of developing  generalized ischemia is considered. Correlate with lactate. Suggest administration of water soluble positive oral contrast through the patient's gastric tube with CT imaging of the abdomen if surgical exploration is not undertaken to exclude concomitant gastric compromise. 3. Basilar volume loss and more confluent airspace disease noted on the RIGHT as compared to the LEFT. Findings may represent atelectasis or developing pneumonia due to aspiration, attention on follow-up 4. Small RIGHT-sided pleural effusion. 5. Signs of coronary artery disease. 6. Renal cortical scarring bilaterally and signs of renal calculi without change. 7. Aortic atherosclerosis. Aortic Atherosclerosis (ICD10-I70.0). Critical Value/emergent results were called by telephone at the time of interpretation on 04/10/2022 at 6:35 pm to provider [facility] , who verbally acknowledged these results.  Electronically Signed   By: Zetta Bills M.D.   On: 04/10/2022 18:36   ECHOCARDIOGRAM LIMITED  Result Date: 04/10/2022    ECHOCARDIOGRAM LIMITED REPORT   Patient Name:   Kyle Delacruz [facility] Date of Exam: 04/10/2022 Medical Rec #:  [id number]       Height:       69.0 in Accession #:    [id number]      Weight:       250.2 lb Date of Birth:  Jul 31, 1955       BSA:          2.272 m Patient Age:    13 years        BP:           113/69 mmHg Patient Gender: M               HR:           138 bpm. Exam Location:  Inpatient Procedure: Limited Echo STAT ECHO Indications:    Pulmonary embolus  History:        Patient has prior history of Echocardiogram examinations, most                 recent 04/09/2022. Abnormal ECG, COPD, Signs/Symptoms:Dyspnea                 and Shortness of Breath; Risk Factors:Hypertension,                 Dyslipidemia, Current Smoker and Sleep Apnea.  Sonographer:    Eartha Inch Referring Phys: [id number] Candee Furbish  Sonographer Comments: Technically challenging study due to limited acoustic windows. Image acquisition challenging due to patient body habitus.  Comparison(s): Very brief, limited 2D only study for right ventricular assessment in a patient with pulmonary embolism. Apical image is very poor. Based on parasternal images, the right ventricle does not appear to be dilated or severely depressed. Sanda Klein MD Electronically signed by Sanda Klein MD Signature Date/Time: 04/10/2022/4:52:57 PM    Final    Portable Chest x-ray  Result Date: 04/10/2022 CLINICAL DATA:  Intubation EXAM: PORTABLE CHEST 1 VIEW COMPARISON:  08/19/2019 FINDINGS: Endotracheal tube in good position. Left jugular central venous catheter tip in the SVC. NG coiled in the stomach. Hypoventilation with bibasilar atelectasis. Chronic elevation right hemidiaphragm unchanged. No significant pleural fluid. No pneumothorax. IMPRESSION: 1. Endotracheal tube in good position. 2. Hypoventilation with bibasilar  atelectasis. 3. Central venous catheter tip in the SVC.  No pneumothorax. Electronically Signed   By: Franchot Gallo M.D.   On: 04/10/2022 16:51   DG Abd Portable 1V  Result Date: 04/10/2022 CLINICAL DATA:  [id number] Abdominal pain [id number] EXAM: PORTABLE ABDOMEN - 1 VIEW COMPARISON:  CT abdomen pelvis 04/08/2022 FINDINGS: Persistent marked gaseous  dilatation of the colon (up to 12 cm on radiograph). Limited evaluation for free gas on this supine radiograph. No definite radiographic configuration to suggest volvulus. No radio-opaque calculi or other significant radiographic abnormality are seen. Lumbar surgical hardware and bilateral hip total arthroplasty. IMPRESSION: Grossly stable with persistent marked gaseous dilatation of the colon. Finding better evaluated on CT abdomen 04/08/2022. Electronically Signed   By: Iven Finn M.D.   On: 04/10/2022 12:45   ECHOCARDIOGRAM COMPLETE  Result Date: 04/09/2022    ECHOCARDIOGRAM REPORT   Patient Name:   Kyle Delacruz [facility] Date of Exam: 04/09/2022 Medical Rec #:  [id number]       Height:       69.0 in Accession #:    [id number]      Weight:       252.6 lb Date of Birth:  [birth year]       BSA:          2.282 m Patient Age:    14 years        BP:           111/69 mmHg Patient Gender: M               HR:           97 bpm. Exam Location:  Inpatient Procedure: 2D Echo, Cardiac Doppler and Color Doppler Indications:    I26.02 Pulmonary embolus  History:        Patient has prior history of Echocardiogram examinations, most                 recent 07/27/2021. Abnormal ECG, COPD, Signs/Symptoms:Dyspnea and                 Shortness of Breath; Risk Factors:Hypertension, Dyslipidemia,                 Sleep Apnea and Current Smoker.  Sonographer:    Roseanna Rainbow RDCS Referring Phys: [id number] ERIC CHEN  Sonographer Comments: Technically difficult study due to poor echo windows and patient is obese. Image acquisition challenging due to patient body habitus. IMPRESSIONS  1. Left ventricular  ejection fraction, by estimation, is 60 to 65%. The left ventricle has normal function. The left ventricle has no regional wall motion abnormalities. Left ventricular diastolic parameters are consistent with Grade I diastolic dysfunction (impaired relaxation).  2. Right ventricular systolic function is normal. The right ventricular size is mildly enlarged. There is mildly elevated pulmonary artery systolic pressure. The estimated right ventricular systolic pressure is 67.2 mmHg.  3. The mitral valve is normal in structure. No evidence of mitral valve regurgitation. No evidence of mitral stenosis.  4. The aortic valve is tricuspid. Aortic valve regurgitation is not visualized. No aortic stenosis is present.  5. Aortic dilatation noted. There is mild dilatation of the ascending aorta, measuring 40 mm.  6. The inferior vena cava is normal in size with greater than 50% respiratory variability, suggesting right atrial pressure of 3 mmHg. FINDINGS  Left Ventricle: Left ventricular ejection fraction, by estimation, is 60 to 65%. The left ventricle has normal function. The left ventricle has no regional wall motion abnormalities. The left ventricular internal cavity size was normal in size. There is  no left ventricular hypertrophy. Left ventricular diastolic parameters are consistent with Grade I diastolic dysfunction (impaired relaxation). Right Ventricle: The right ventricular size is mildly enlarged. No increase in right ventricular wall thickness. Right ventricular systolic function is normal. There is mildly elevated pulmonary artery systolic pressure.  The tricuspid regurgitant velocity is 2.92 m/s, and with an assumed right atrial pressure of 3 mmHg, the estimated right ventricular systolic pressure is 40.9 mmHg. Left Atrium: Left atrial size was normal in size. Right Atrium: Right atrial size was normal in size. Pericardium: There is no evidence of pericardial effusion. Mitral Valve: The mitral valve is normal in  structure. No evidence of mitral valve regurgitation. No evidence of mitral valve stenosis. Tricuspid Valve: The tricuspid valve is normal in structure. Tricuspid valve regurgitation is trivial. Aortic Valve: The aortic valve is tricuspid. Aortic valve regurgitation is not visualized. No aortic stenosis is present. Pulmonic Valve: The pulmonic valve was normal in structure. Pulmonic valve regurgitation is not visualized. Aorta: The aortic root is normal in size and structure and aortic dilatation noted. There is mild dilatation of the ascending aorta, measuring 40 mm. Venous: The inferior vena cava is normal in size with greater than 50% respiratory variability, suggesting right atrial pressure of 3 mmHg. IAS/Shunts: No atrial level shunt detected by color flow Doppler.  LEFT VENTRICLE PLAX 2D LVIDd:         4.40 cm     Diastology LVIDs:         2.50 cm     LV e' medial:    6.64 cm/s LV PW:         1.40 cm     LV E/e' medial:  9.1 LV IVS:        1.10 cm     LV e' lateral:   8.05 cm/s LVOT diam:     2.10 cm     LV E/e' lateral: 7.5 LV SV:         53 LV SV Index:   23 LVOT Area:     3.46 cm  LV Volumes (MOD) LV vol d, MOD A2C: 62.5 ml LV vol d, MOD A4C: 70.5 ml LV vol s, MOD A2C: 20.0 ml LV vol s, MOD A4C: 20.0 ml LV SV MOD A2C:     42.5 ml LV SV MOD A4C:     70.5 ml LV SV MOD BP:      48.4 ml RIGHT VENTRICLE             IVC RV S prime:     13.20 cm/s  IVC diam: 0.90 cm TAPSE (M-mode): 1.6 cm LEFT ATRIUM             Index        RIGHT ATRIUM           Index LA diam:        3.10 cm 1.36 cm/m   RA Area:     10.80 cm LA Vol (A2C):   18.5 ml 8.11 ml/m   RA Volume:   21.70 ml  9.51 ml/m LA Vol (A4C):   27.1 ml 11.88 ml/m LA Biplane Vol: 24.2 ml 10.61 ml/m  AORTIC VALVE LVOT Vmax:   101.45 cm/s LVOT Vmean:  71.550 cm/s LVOT VTI:    0.154 m  AORTA Ao Root diam: 3.50 cm Ao Asc diam:  3.95 cm MITRAL VALVE               TRICUSPID VALVE MV Area (PHT): 4.10 cm    TR Peak grad:   34.1 mmHg MV Decel Time: 185 msec    TR  Vmax:        292.00 cm/s MV E velocity: 60.65 cm/s MV A velocity: 62.30 cm/s  SHUNTS MV E/A ratio:  0.97  Systemic VTI:  0.15 m                            Systemic Diam: 2.10 cm Dalton McleanMD Electronically signed by Franki Monte Signature Date/Time: 04/09/2022/2:41:12 PM    Final    VAS [place] LOWER EXTREMITY VENOUS (DVT)  Result Date: 04/09/2022  Lower Venous DVT Study Patient Name:  Kyle Delacruz [facility]  Date of Exam:   04/09/2022 Medical Rec #: [id number]        Accession #:    [id number] Date of Birth: 16-Nov-1955        Patient Gender: M Patient Age:   71 years Exam Location:  [facility] Procedure:      VAS [place] LOWER EXTREMITY VENOUS (DVT) Referring Phys: RALPH NETTEY --------------------------------------------------------------------------------  Indications: Pulmonary embolism, and Edema. Other Indications: Hx of RLE DVT & PE 07/26/21 post spinal surgery. Continued                    chronic RLE DVT with improvement and reconstituted flow in                    RLEV studies performed on 10/06/21, 12/22/21, and 02/13/22.                    Recent spinal surgery on 04/04/22 now with new PE and                    occlusive RLE DVT. Limitations: Poor ultrasound/tissue interface. Comparison Study: No previous LLEV exams Performing Technologist: Jody Hill RVT, RDMS  Examination Guidelines: A complete evaluation includes B-mode imaging, spectral Doppler, color Doppler, and power Doppler as needed of all accessible portions of each vessel. Bilateral testing is considered an integral part of a complete examination. Limited examinations for reoccurring indications may be performed as noted. The reflux portion of the exam is performed with the patient in reverse Trendelenburg.  +-----+---------------+---------+-----------+----------+--------------+ RIGHTCompressibilityPhasicitySpontaneityPropertiesThrombus Aging +-----+---------------+---------+-----------+----------+--------------+ CFV  Full            Yes      Yes                                 +-----+---------------+---------+-----------+----------+--------------+   +---------+---------------+---------+-----------+----------+--------------+ LEFT     CompressibilityPhasicitySpontaneityPropertiesThrombus Aging +---------+---------------+---------+-----------+----------+--------------+ CFV      Full           Yes      Yes                                 +---------+---------------+---------+-----------+----------+--------------+ SFJ      Full                                                        +---------+---------------+---------+-----------+----------+--------------+ FV Prox  Full           Yes      Yes                                 +---------+---------------+---------+-----------+----------+--------------+ FV Mid   Full  Yes      Yes                                 +---------+---------------+---------+-----------+----------+--------------+ FV DistalFull           Yes      Yes                                 +---------+---------------+---------+-----------+----------+--------------+ PFV      Full                                                        +---------+---------------+---------+-----------+----------+--------------+ POP      Full           Yes      Yes                                 +---------+---------------+---------+-----------+----------+--------------+ PTV      Full                                                        +---------+---------------+---------+-----------+----------+--------------+ PERO     Full                                                        +---------+---------------+---------+-----------+----------+--------------+     Summary: RIGHT: - No evidence of common femoral vein obstruction.  LEFT: - There is no evidence of deep vein thrombosis in the lower extremity.  - No cystic structure found in the popliteal fossa.  *See table(s) above for  measurements and observations. Electronically signed by Monica Martinez MD on 04/09/2022 at 12:41:12 PM.    Final    US Venous Img Lower Unilateral Right  Result Date: 04/08/2022 CLINICAL DATA:  Right lower extremity swelling. EXAM: Right LOWER EXTREMITY VENOUS DOPPLER ULTRASOUND TECHNIQUE: Gray-scale sonography with graded compression, as well as color Doppler and duplex ultrasound were performed to evaluate the lower extremity deep venous systems from the level of the common femoral vein and including the common femoral, femoral, profunda femoral, popliteal and calf veins including the posterior tibial, peroneal and gastrocnemius veins when visible. The superficial great saphenous vein was also interrogated. Spectral Doppler was utilized to evaluate flow at rest and with distal augmentation maneuvers in the common femoral, femoral and popliteal veins. COMPARISON:  July 26, 2021. FINDINGS: Contralateral Common Femoral Vein: Respiratory phasicity is normal and symmetric with the symptomatic side. No evidence of thrombus. Normal compressibility. Common Femoral Vein: No evidence of thrombus. Normal compressibility, respiratory phasicity and response to augmentation. Saphenofemoral Junction: No evidence of thrombus. Normal compressibility and flow on color Doppler imaging. Profunda Femoral Vein: No evidence of thrombus. Normal compressibility and flow on color Doppler imaging. Femoral Vein: Noncompressible with no flow consistent with occlusive thrombus. Popliteal Vein: Noncompressible with no flow consistent with occlusive  thrombus. Calf Veins: No evidence of thrombus. Normal compressibility and flow on color Doppler imaging. Superficial Great Saphenous Vein: No evidence of thrombus. Normal compressibility. Venous Reflux:  None. Other Findings:  None. IMPRESSION: Occlusive deep venous thrombosis is noted in the right femoral and popliteal veins. This was present on the prior exam of July 26, 2021.  Electronically Signed   By: Marijo Conception M.D.   On: 04/08/2022 12:22   CT L-SPINE NO CHARGE  Result Date: 04/08/2022 CLINICAL DATA:  66 year old male status post lumbar spine fusion last week. EXAM: CT LUMBAR SPINE WITH CONTRAST TECHNIQUE: Technique: Multiplanar CT images of the lumbar spine were reconstructed from contemporary CT of the Abdomen and Pelvis. RADIATION DOSE REDUCTION: This exam was performed according to the departmental dose-optimization program which includes automated exposure control, adjustment of the mA and/or kV according to patient size and/or use of iterative reconstruction technique. CONTRAST:  No additional. COMPARISON:  CTA Chest, Abdomen, and Pelvis today are reported separately. Lumbar MRI 05/24/2021. FINDINGS: Segmentation: Normal, the same numbering system used on the MRI this year. Alignment: Lumbar lordosis not significantly changed. No significant scoliosis or spondylolisthesis. Vertebrae: Postoperative details are below. Lower thoracic spine ankylosis through L1-L2 related to flowing endplate osteophytes. Associated T12 costovertebral junction ankylosis. Interbody ankylosis also at L3-L4 and L5-S1 secondary to bulky endplate osteophytes. Visible sacrum and SI joints appear intact. No acute osseous abnormality identified. Paraspinal and other soft tissues: Abdomen and pelvis are reported separately today. Postoperative lumbar paraspinal soft tissue changes with no adverse features. Disc levels: Lower thoracic through L1-L2 interbody ankylosis. L2-L3: Bilateral pedicle screws and right side interbody implant. Satisfactory hardware appearance. Right side posterior decompression. L3-L4: Bilateral pedicle screws and right side interbody implant. Satisfactory hardware appearance. Pre-existing interbody ankylosis at this level. L4-L5: Bilateral pedicle screws. Right side interbody implant and some bone graft material is somewhat posteriorly situated on the right (series 6, image  34). Involvement of some of the right L4 neural foramen, although some right side posterior element decompression has also occurred at this level. Furthermore, the right L5 pedicle screw partially traverses the right L5 neural foramen as seen on sagittal image 32 and coronal image 58. No other adverse hardware features. L5-S1:  Chronic interbody ankylosis. IMPRESSION: 1. Underlying lower thoracic through L1-L2 ankylosis, and ankylosis also at L3-L4 and L5-S1, due to bulky endplate osteophytes. 2. Superimposed L2-L3 through L4-L5 posterior and interbody fusion hardware placement. The L4-L5 interbody implant is somewhat posteriorly positioned and encroaches on the right L4 neural foramen, while the right L5 pedicle screw partially traverses the right L5 neural foramen. 3. CTA Chest, Abdomen And Pelvis today are reported separately. Electronically Signed   By: Genevie Ann M.D.   On: 04/08/2022 10:13   CT Angio Chest PE W and/or Wo Contrast  Result Date: 04/08/2022 CLINICAL DATA:  High probability of pulmonary embolism. EXAM: CT ANGIOGRAPHY CHEST WITH CONTRAST TECHNIQUE: Multidetector CT imaging of the chest was performed using the standard protocol during bolus administration of intravenous contrast. Multiplanar CT image reconstructions and MIPs were obtained to evaluate the vascular anatomy. RADIATION DOSE REDUCTION: This exam was performed according to the departmental dose-optimization program which includes automated exposure control, adjustment of the mA and/or kV according to patient size and/or use of iterative reconstruction technique. CONTRAST:  121m OMNIPAQUE IOHEXOL 300 MG/ML  SOLN COMPARISON:  July 26, 2021. FINDINGS: Cardiovascular: Large filling defect is seen in the filling defect of the upper lobe branch of the right pulmonary artery, consistent  with acute pulmonary embolus. RV/LV ratio of 1.3 is noted suggesting right heart strain. Mild cardiomegaly is noted. No pericardial effusion. Moderate to  severe coronary artery calcifications are noted. Mediastinum/Nodes: No enlarged mediastinal, hilar, or axillary lymph nodes. Thyroid gland, trachea, and esophagus demonstrate no significant findings. Lungs/Pleura: No pneumothorax or pleural effusion is noted. Hypoinflation of the lungs is noted with mild bibasilar subsegmental atelectasis. Musculoskeletal: No chest wall abnormality. No acute or significant osseous findings. Review of the MIP images confirms the above findings. IMPRESSION: Acute pulmonary embolus is noted in the upper lobe branch of the right pulmonary artery. Positive for acute PE with CT evidence of right heart strain (RV/LV Ratio = 1.3) consistent with at least submassive (intermediate risk) PE. The presence of right heart strain has been associated with an increased risk of morbidity and mortality. Please refer to the "Code PE Focused" order set in EPIC. Critical Value/emergent results were called by telephone at the time of interpretation on 04/08/2022 at 10:07 am to provider [facility] , who verbally acknowledged these results. Moderate to severe coronary calcifications are noted suggesting coronary artery disease. Hypoinflation of the lungs is noted with mild bibasilar subsegmental atelectasis. Aortic Atherosclerosis (ICD10-I70.0). Electronically Signed   By: Marijo Conception M.D.   On: 04/08/2022 10:07   CT ABDOMEN PELVIS W CONTRAST  Result Date: 04/08/2022 CLINICAL DATA:  Abdominal pain.  Bowel obstruction suspected. EXAM: CT ABDOMEN AND PELVIS WITH CONTRAST TECHNIQUE: Multidetector CT imaging of the abdomen and pelvis was performed using the standard protocol following bolus administration of intravenous contrast. RADIATION DOSE REDUCTION: This exam was performed according to the departmental dose-optimization program which includes automated exposure control, adjustment of the mA and/or kV according to patient size and/or use of iterative reconstruction technique. CONTRAST:  118m  OMNIPAQUE IOHEXOL 300 MG/ML  SOLN COMPARISON:  10/02/2021 FINDINGS: Lower chest: See report for chest CTA performed at the same time and dictated separately. Hepatobiliary: No suspicious focal abnormality within the liver parenchyma. There is no evidence for gallstones, gallbladder wall thickening, or pericholecystic fluid. No intrahepatic or extrahepatic biliary dilation. Pancreas: No focal mass lesion. No dilatation of the main duct. No intraparenchymal cyst. No peripancreatic edema. Spleen: No splenomegaly. No focal mass lesion. Adrenals/Urinary Tract: No adrenal nodule or mass. Several small nonobstructing stones are seen in the right kidney measuring up to 4 mm. 3 tiny 1-2 mm nonobstructing stones are seen in the left kidney. Tiny interpolar left renal cyst evident. No followup imaging is recommended. No evidence for hydroureter. Bladder obscured by beam hardening artifact from bilateral hip replacement. Stomach/Bowel: Stomach is unremarkable. No gastric wall thickening. No evidence of outlet obstruction. Duodenum is normally positioned as is the ligament of Treitz. No small bowel wall thickening. No small bowel dilatation. The terminal ileum is normal. The appendix is normal. Right and transverse colon is markedly distended with gas and stool. Ascending colon measures up to 9.2 cm diameter. Transverse colon measures up to 8.1 cm diameter. Colon gradually tapers through the descending and sigmoid segments into the rectum. There is no abrupt or discrete transition zone evident. Fluid is visible in the rectum. No colonic wall thickening or colonic wall pneumatosis. No substantial pericolonic edema or inflammation. Vascular/Lymphatic: There is moderate atherosclerotic calcification of the abdominal aorta without aneurysm. There is no gastrohepatic or hepatoduodenal ligament lymphadenopathy. No retroperitoneal or mesenteric lymphadenopathy. No pelvic sidewall lymphadenopathy. Reproductive: Prostate gland is  obscured by beam hardening artifact. Other: Trace fluid is seen in the para colic gutters  bilaterally. Musculoskeletal: Status post bilateral hip replacement. Lumbar fusion hardware evident. IMPRESSION: 1. Right and transverse colon is markedly distended with gas and stool. Colon gradually tapers through the descending and sigmoid segments into the rectum. There is no abrupt or discrete transition zone evident. Fluid is visible in the rectum. 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 2. Trace fluid in the para colic gutters bilaterally. 3. Bilateral nonobstructing renal stones. 4. See report for chest CTA performed at the same time and dictated separately. 5.  Aortic Atherosclerosis (ICD10-I70.0). Electronically Signed   By: Misty Stanley M.D.   On: 04/08/2022 10:06   (Echo, Carotid, EGD, Colonoscopy, ERCP)    Subjective: Patient seen and examined.  No overnight events.  Very happy to be able to go home today.   Discharge Exam: Vitals:   04/22/22 0110 04/22/22 0430  BP: 124/69 112/64  Pulse: 72 86  Resp: 18 17  Temp: 98.1 F (36.7 C) 98 F (36.7 C)  SpO2: 100% 98%   Vitals:   04/21/22 2317 04/22/22 0110 04/22/22 0430 04/22/22 0441  BP: 124/71 124/69 112/64   Pulse:  72 86   Resp: '20 18 17   '$ Temp:  98.1 F (36.7 C) 98 F (36.7 C)   TempSrc:  Oral Oral   SpO2:  100% 98%   Weight:    106.3 kg  Height:        General: Pt is alert, awake, not in acute distress, on room air. Cardiovascular: RRR, S1/S2 +, no rubs, no gallops Respiratory: CTA bilaterally, no wheezing, no rhonchi Abdominal: Soft, distended but nontender.  Midline incision intact with staples.  Right lower quadrant ileostomy with loose stool. Extremities: no edema, no cyanosis    The results of significant diagnostics from this hospitalization (including imaging, microbiology, ancillary and laboratory) are listed below for reference.     Microbiology: Recent Results (from the past 240  hour(s))  Culture, blood (Routine X 2) w Reflex to ID Panel     Status: None (Preliminary result)   Collection Time: 04/17/22  9:43 AM   Specimen: BLOOD  Result Value Ref Range Status   Specimen Description   Final    BLOOD BLOOD LEFT ARM Performed at [facility] [address] [place], [place] [address]    Special Requests   Final    BOTTLES DRAWN AEROBIC ONLY Blood Culture adequate volume Performed at [facility] [address], [place], [place] [address]    Culture   Final    NO GROWTH 4 DAYS Performed at [facility], [employer] [address] [place], [place] [address]    Report Status PENDING  Incomplete  Culture, blood (Routine X 2) w Reflex to ID Panel     Status: None (Preliminary result)   Collection Time: 04/17/22  9:43 AM   Specimen: BLOOD  Result Value Ref Range Status   Specimen Description   Final    BLOOD BLOOD LEFT FOREARM Performed at [facility] [address] [place], [place] [address]    Special Requests   Final    BOTTLES DRAWN AEROBIC ONLY Blood Culture adequate volume Performed at [facility] [address], [place], [place] [address]    Culture   Final    NO GROWTH 4 DAYS Performed at [facility], [place] [address] [place], [place] [address]    Report Status PENDING  Incomplete  Urine Culture     Status:  Abnormal   Collection Time: 04/17/22  1:52 PM   Specimen: Urine, Clean Catch  Result Value Ref Range Status   Specimen Description   Final    URINE, CLEAN CATCH Performed at [facility], [place] [address], [place], [place] [address]    Special Requests   Final    NONE Performed at [facility], [place] [address], [place], [place] [address]    Culture >=100,000 COLONIES/mL ENTEROBACTER CLOACAE (A)  Final   Report Status 04/19/2022 FINAL  Final   Organism ID, Bacteria ENTEROBACTER CLOACAE (A)  Final      Susceptibility    Enterobacter cloacae - MIC*    CEFAZOLIN >=64 RESISTANT Resistant     CEFEPIME <=0.12 SENSITIVE Sensitive     CIPROFLOXACIN <=0.25 SENSITIVE Sensitive     GENTAMICIN <=1 SENSITIVE Sensitive     IMIPENEM <=0.25 SENSITIVE Sensitive     NITROFURANTOIN 64 INTERMEDIATE Intermediate     TRIMETH/SULFA <=20 SENSITIVE Sensitive     PIP/TAZO <=4 SENSITIVE Sensitive     * >=100,000 COLONIES/mL ENTEROBACTER CLOACAE     Labs: BNP (last 3 results) Recent Labs    07/26/21 2351 04/08/22 1021  BNP 46.5 36.1   Basic Metabolic Panel: Recent Labs  Lab 04/16/22 0534 04/17/22 0246 04/18/22 0254 04/19/22 0437 04/20/22 0412 04/21/22 0758  NA 139 140 139 141 135 137  K 3.4* 3.4* 4.1 4.1 3.7 3.6  CL 106 106 107 107 101 99  CO2 '23 24 23 '$ 21* 22 26  GLUCOSE 113* 104* 109* 114* 114* 119*  BUN '9 8 13 15 15 13  '$ CREATININE 0.92 0.89 0.95 0.91 0.90 0.89  CALCIUM 8.2* 8.0* 7.9* 8.0* 8.0* 8.5*  MG  --   --   --  1.8 2.0  --   PHOS 3.0  --   --  2.6  --   --    Liver Function Tests: No results for input(s): "AST", "ALT", "ALKPHOS", "BILITOT", "PROT", "ALBUMIN" in the last 168 hours. No results for input(s): "LIPASE", "AMYLASE" in the last 168 hours. No results for input(s): "AMMONIA" in the last 168 hours. CBC: Recent Labs  Lab 04/18/22 0254 04/19/22 0437 04/20/22 0412 04/21/22 0758 04/22/22 0345  WBC 23.1* 21.5* 18.3* 17.7* 15.4*  NEUTROABS  --  17.6* 14.8* 14.3* 12.0*  HGB 8.4* 8.5* 8.7* 8.9* 8.7*  HCT 26.7* 27.5* 28.4* 28.5* 27.9*  MCV 93.7 93.9 94.4 92.5 92.7  PLT 262 312 369 442* 442*   Cardiac Enzymes: No results for input(s): "CKTOTAL", "CKMB", "CKMBINDEX", "TROPONINI" in the last 168 hours. BNP: Invalid input(s): "POCBNP" CBG: Recent Labs  Lab 04/21/22 1557 04/21/22 2037 04/22/22 0109 04/22/22 0432 04/22/22 0730  GLUCAP 117* 120* 114* 126* 109*   D-Dimer No results for input(s): "DDIMER" in the last 72 hours. Hgb A1c No results for input(s): "HGBA1C" in the last 72  hours. Lipid Profile No results for input(s): "CHOL", "HDL", "LDLCALC", "TRIG", "CHOLHDL", "LDLDIRECT" in the last 72 hours. Thyroid function studies No results for input(s): "TSH", "T4TOTAL", "T3FREE", "THYROIDAB" in the last 72 hours.  Invalid input(s): "FREET3" Anemia work up No results for input(s): "VITAMINB12", "FOLATE", "FERRITIN", "TIBC", "IRON", "RETICCTPCT" in the last 72 hours. Urinalysis    Component Value Date/Time   COLORURINE AMBER (A) 04/17/2022 1148   APPEARANCEUR CLEAR 04/17/2022 1148   LABSPEC 1.019 04/17/2022 1148   PHURINE 5.0 04/17/2022 1148   GLUCOSEU NEGATIVE 04/17/2022 1148   HGBUR MODERATE (A) 04/17/2022 1148   BILIRUBINUR NEGATIVE 04/17/2022 1148  KETONESUR 20 (A) 04/17/2022 1148   PROTEINUR 30 (A) 04/17/2022 1148   UROBILINOGEN 0.2 04/26/2014 1212   NITRITE NEGATIVE 04/17/2022 1148   LEUKOCYTESUR SMALL (A) 04/17/2022 1148   Sepsis Labs Recent Labs  Lab 04/19/22 0437 04/20/22 0412 04/21/22 0758 04/22/22 0345  WBC 21.5* 18.3* 17.7* 15.4*   Microbiology Recent Results (from the past 240 hour(s))  Culture, blood (Routine X 2) w Reflex to ID Panel     Status: None (Preliminary result)   Collection Time: 04/17/22  9:43 AM   Specimen: BLOOD  Result Value Ref Range Status   Specimen Description   Final    BLOOD BLOOD LEFT ARM Performed at [facility] [address], [place], [place] [address]    Special Requests   Final    BOTTLES DRAWN AEROBIC ONLY Blood Culture adequate volume Performed at [facility] [address] [place], [place] [address]    Culture   Final    NO GROWTH 4 DAYS Performed at [facility], [place] [address], [place], [place] [address]    Report Status PENDING  Incomplete  Culture, blood (Routine X 2) w Reflex to ID Panel     Status: None (Preliminary result)   Collection Time: 04/17/22  9:43 AM   Specimen: BLOOD  Result Value Ref Range Status   Specimen Description   Final     BLOOD BLOOD LEFT FOREARM Performed at [facility] [address], [place], [place] [address]    Special Requests   Final    BOTTLES DRAWN AEROBIC ONLY Blood Culture adequate volume Performed at [facility] [address], [place], [place] [address]    Culture   Final    NO GROWTH 4 DAYS Performed at [facility], [place] [address], [place], [place] [address]    Report Status PENDING  Incomplete  Urine Culture     Status: Abnormal   Collection Time: 04/17/22  1:52 PM   Specimen: Urine, Clean Catch  Result Value Ref Range Status   Specimen Description   Final    URINE, CLEAN CATCH Performed at [facility], [place] [address] [place], [place] [address]    Special Requests   Final    NONE Performed at [facility], [place] [address] [place],  [address]    Culture >=100,000 COLONIES/mL ENTEROBACTER CLOACAE (A)  Final   Report Status 04/19/2022 FINAL  Final   Organism ID, Bacteria ENTEROBACTER CLOACAE (A)  Final      Susceptibility   Enterobacter cloacae - MIC*    CEFAZOLIN >=64 RESISTANT Resistant     CEFEPIME <=0.12 SENSITIVE Sensitive     CIPROFLOXACIN <=0.25 SENSITIVE Sensitive     GENTAMICIN <=1 SENSITIVE Sensitive     IMIPENEM <=0.25 SENSITIVE Sensitive     NITROFURANTOIN 64 INTERMEDIATE Intermediate     TRIMETH/SULFA <=20 SENSITIVE Sensitive     PIP/TAZO <=4 SENSITIVE Sensitive     * >=100,000 COLONIES/mL ENTEROBACTER CLOACAE     Time coordinating discharge: 35 minutes  SIGNED:   Barb Merino, MD  [facility] 04/22/2022, 8:31 AM

## 2022-04-24 DIAGNOSIS — M4722 Other spondylosis with radiculopathy, cervical region: Secondary | ICD-10-CM | POA: Diagnosis not present

## 2022-04-24 DIAGNOSIS — M4726 Other spondylosis with radiculopathy, lumbar region: Secondary | ICD-10-CM | POA: Diagnosis not present

## 2022-04-24 DIAGNOSIS — Z4789 Encounter for other orthopedic aftercare: Secondary | ICD-10-CM | POA: Diagnosis not present

## 2022-04-24 DIAGNOSIS — M48062 Spinal stenosis, lumbar region with neurogenic claudication: Secondary | ICD-10-CM | POA: Diagnosis not present

## 2022-04-24 DIAGNOSIS — M5432 Sciatica, left side: Secondary | ICD-10-CM | POA: Diagnosis not present

## 2022-04-24 DIAGNOSIS — M5431 Sciatica, right side: Secondary | ICD-10-CM | POA: Diagnosis not present

## 2022-04-25 DIAGNOSIS — M5431 Sciatica, right side: Secondary | ICD-10-CM | POA: Diagnosis not present

## 2022-04-25 DIAGNOSIS — M4722 Other spondylosis with radiculopathy, cervical region: Secondary | ICD-10-CM | POA: Diagnosis not present

## 2022-04-25 DIAGNOSIS — M5432 Sciatica, left side: Secondary | ICD-10-CM | POA: Diagnosis not present

## 2022-04-25 DIAGNOSIS — M48062 Spinal stenosis, lumbar region with neurogenic claudication: Secondary | ICD-10-CM | POA: Diagnosis not present

## 2022-04-25 DIAGNOSIS — M4726 Other spondylosis with radiculopathy, lumbar region: Secondary | ICD-10-CM | POA: Diagnosis not present

## 2022-04-25 DIAGNOSIS — Z4789 Encounter for other orthopedic aftercare: Secondary | ICD-10-CM | POA: Diagnosis not present

## 2022-04-26 DIAGNOSIS — Z4789 Encounter for other orthopedic aftercare: Secondary | ICD-10-CM | POA: Diagnosis not present

## 2022-04-26 DIAGNOSIS — M4726 Other spondylosis with radiculopathy, lumbar region: Secondary | ICD-10-CM | POA: Diagnosis not present

## 2022-04-26 DIAGNOSIS — M5431 Sciatica, right side: Secondary | ICD-10-CM | POA: Diagnosis not present

## 2022-04-26 DIAGNOSIS — M4722 Other spondylosis with radiculopathy, cervical region: Secondary | ICD-10-CM | POA: Diagnosis not present

## 2022-04-26 DIAGNOSIS — M48062 Spinal stenosis, lumbar region with neurogenic claudication: Secondary | ICD-10-CM | POA: Diagnosis not present

## 2022-04-26 DIAGNOSIS — M5432 Sciatica, left side: Secondary | ICD-10-CM | POA: Diagnosis not present

## 2022-04-30 DIAGNOSIS — Z4789 Encounter for other orthopedic aftercare: Secondary | ICD-10-CM | POA: Diagnosis not present

## 2022-04-30 DIAGNOSIS — M48062 Spinal stenosis, lumbar region with neurogenic claudication: Secondary | ICD-10-CM | POA: Diagnosis not present

## 2022-04-30 DIAGNOSIS — M4722 Other spondylosis with radiculopathy, cervical region: Secondary | ICD-10-CM | POA: Diagnosis not present

## 2022-04-30 DIAGNOSIS — M5431 Sciatica, right side: Secondary | ICD-10-CM | POA: Diagnosis not present

## 2022-04-30 DIAGNOSIS — M4726 Other spondylosis with radiculopathy, lumbar region: Secondary | ICD-10-CM | POA: Diagnosis not present

## 2022-04-30 DIAGNOSIS — M5432 Sciatica, left side: Secondary | ICD-10-CM | POA: Diagnosis not present

## 2022-05-01 DIAGNOSIS — M5432 Sciatica, left side: Secondary | ICD-10-CM | POA: Diagnosis not present

## 2022-05-01 DIAGNOSIS — M5431 Sciatica, right side: Secondary | ICD-10-CM | POA: Diagnosis not present

## 2022-05-01 DIAGNOSIS — Z4789 Encounter for other orthopedic aftercare: Secondary | ICD-10-CM | POA: Diagnosis not present

## 2022-05-01 DIAGNOSIS — M4726 Other spondylosis with radiculopathy, lumbar region: Secondary | ICD-10-CM | POA: Diagnosis not present

## 2022-05-01 DIAGNOSIS — M961 Postlaminectomy syndrome, not elsewhere classified: Secondary | ICD-10-CM | POA: Diagnosis not present

## 2022-05-01 DIAGNOSIS — M48062 Spinal stenosis, lumbar region with neurogenic claudication: Secondary | ICD-10-CM | POA: Diagnosis not present

## 2022-05-01 DIAGNOSIS — M4722 Other spondylosis with radiculopathy, cervical region: Secondary | ICD-10-CM | POA: Diagnosis not present

## 2022-05-01 DIAGNOSIS — Z981 Arthrodesis status: Secondary | ICD-10-CM | POA: Diagnosis not present

## 2022-05-02 DIAGNOSIS — M48062 Spinal stenosis, lumbar region with neurogenic claudication: Secondary | ICD-10-CM | POA: Diagnosis not present

## 2022-05-02 DIAGNOSIS — Z932 Ileostomy status: Secondary | ICD-10-CM | POA: Diagnosis not present

## 2022-05-02 DIAGNOSIS — M4726 Other spondylosis with radiculopathy, lumbar region: Secondary | ICD-10-CM | POA: Diagnosis not present

## 2022-05-02 DIAGNOSIS — M4722 Other spondylosis with radiculopathy, cervical region: Secondary | ICD-10-CM | POA: Diagnosis not present

## 2022-05-02 DIAGNOSIS — R58 Hemorrhage, not elsewhere classified: Secondary | ICD-10-CM | POA: Diagnosis not present

## 2022-05-02 DIAGNOSIS — M5431 Sciatica, right side: Secondary | ICD-10-CM | POA: Diagnosis not present

## 2022-05-02 DIAGNOSIS — Z4789 Encounter for other orthopedic aftercare: Secondary | ICD-10-CM | POA: Diagnosis not present

## 2022-05-02 DIAGNOSIS — R198 Other specified symptoms and signs involving the digestive system and abdomen: Secondary | ICD-10-CM | POA: Diagnosis not present

## 2022-05-02 DIAGNOSIS — I2699 Other pulmonary embolism without acute cor pulmonale: Secondary | ICD-10-CM | POA: Diagnosis not present

## 2022-05-02 DIAGNOSIS — R578 Other shock: Secondary | ICD-10-CM | POA: Diagnosis not present

## 2022-05-02 DIAGNOSIS — I4891 Unspecified atrial fibrillation: Secondary | ICD-10-CM | POA: Diagnosis not present

## 2022-05-02 DIAGNOSIS — M5432 Sciatica, left side: Secondary | ICD-10-CM | POA: Diagnosis not present

## 2022-05-03 ENCOUNTER — Ambulatory Visit (HOSPITAL_COMMUNITY)
Admit: 2022-05-03 | Discharge: 2022-05-03 | Disposition: A | Discharge status: Home / Self Care | Payer: Medicare Other | Source: Ambulatory Visit | Attending: Physician Assistant | Admitting: Physician Assistant

## 2022-05-03 ENCOUNTER — Inpatient Hospital Stay (HOSPITAL_COMMUNITY)
Admission: RE | Admit: 2022-05-03 | Discharge: 2022-05-03 | Disposition: A | Discharge status: Home / Self Care | Payer: Medicare Other | Source: Ambulatory Visit | Attending: Physician Assistant | Admitting: Physician Assistant

## 2022-05-03 ENCOUNTER — Encounter (HOSPITAL_COMMUNITY): Payer: Self-pay | Admitting: Physician Assistant

## 2022-05-03 VITALS — BP 110/68 | HR 67 | Ht 70.0 in | Wt 213.4 lb

## 2022-05-03 DIAGNOSIS — Z86718 Personal history of other venous thrombosis and embolism: Secondary | ICD-10-CM | POA: Diagnosis not present

## 2022-05-03 DIAGNOSIS — I10 Essential (primary) hypertension: Secondary | ICD-10-CM | POA: Diagnosis not present

## 2022-05-03 DIAGNOSIS — D6869 Other thrombophilia: Secondary | ICD-10-CM | POA: Diagnosis not present

## 2022-05-03 DIAGNOSIS — E118 Type 2 diabetes mellitus with unspecified complications: Secondary | ICD-10-CM | POA: Insufficient documentation

## 2022-05-03 DIAGNOSIS — Z7901 Long term (current) use of anticoagulants: Secondary | ICD-10-CM | POA: Diagnosis not present

## 2022-05-03 DIAGNOSIS — E039 Hypothyroidism, unspecified: Secondary | ICD-10-CM | POA: Diagnosis not present

## 2022-05-03 DIAGNOSIS — I480 Paroxysmal atrial fibrillation: Secondary | ICD-10-CM | POA: Diagnosis not present

## 2022-05-03 DIAGNOSIS — G4733 Obstructive sleep apnea (adult) (pediatric): Secondary | ICD-10-CM | POA: Diagnosis not present

## 2022-05-03 DIAGNOSIS — Z79899 Other long term (current) drug therapy: Secondary | ICD-10-CM | POA: Insufficient documentation

## 2022-05-03 DIAGNOSIS — I4891 Unspecified atrial fibrillation: Secondary | ICD-10-CM | POA: Diagnosis not present

## 2022-05-03 NOTE — Progress Notes (Signed)
Primary Care Physician: Prince Solian, MD Primary Cardiologist: Dr Tamala Julian Primary Electrophysiologist: none Referring Physician: Dr Ethelle Lyon is a 66 y.o. male with a history of DVT, HTN, OSA, DM, hypothyroidism, atrial fibrillation who presents for consultation in the [facility]. Patient presented 04/08/22 after lumbar surgery with worsening shortness of breath and RLE swelling. 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  Echo/CTA showing some RV strain. Hospital course complicated by severe constipation vs. colonic ileus partially relieved with enema 04/10/22.  He developed searing epigastric pain with radiation to shoulder, and worsened with movement after enema. He subsequently developed worsening hypoxia, tachycardia and hypotension requiring intubation on 11/28.  Remained in the hospital due to complications from bleeding.   CT results showed a large pericolonic hematoma tracking into the peritoneum and right hemiliver.  Massive transfusion protocol was activated and he underwent emergency ex lap with total colectomy and end ileostomy on 04/10/2022.  Started on IV heparin on 11/29. He was extubated on 11/30 and weaned off of vasopressors on 12/1. 12/8, went into A-fib overnight.  No previous history of A-fib. Patient is on Eliquis for a CHADS2VASC score of 2 and for his DVT/PE.He was discharged on metoprolol for rate control.   Today, patient reports that he is doing reasonably well after his extended hospital stay. He is in SR today. No bleeding issues on anticoagulation.   Today, he denies symptoms of palpitations, chest pain, shortness of breath, orthopnea, PND, lower extremity edema, dizziness, presyncope, syncope, bleeding, or neurologic sequela. The patient is tolerating medications without difficulties and is otherwise without complaint today.    Atrial Fibrillation Risk Factors:  he does have symptoms or diagnosis of sleep apnea. he is not  compliant with CPAP therapy. he does not have a history of rheumatic fever.   he has a BMI of Body mass index is 30.62 kg/m.Marland Kitchen Filed Weights   05/03/22 1059  Weight: 96.8 kg    Family History  Problem Relation Age of Onset   Stroke Mother    Diabetes Father    Heart disease Father        sp CABG   Diabetes Paternal Grandmother    Colon cancer Neg Hx    Stomach cancer Neg Hx    Esophageal cancer Neg Hx    Rectal cancer Neg Hx    Prostate cancer Neg Hx      Atrial Fibrillation Management history:  Previous antiarrhythmic drugs: none Previous cardioversions: none Previous ablations: none CHADS2VASC score: 2 Anticoagulation history: Eliquis   Past Medical History:  Diagnosis Date   Allergy    Arthritis    osteo   CTS (carpal tunnel syndrome)    DJD (degenerative joint disease)    DM2 (diabetes mellitus, type 2) (HCC)    DVT (deep venous thrombosis) (HCC)    GERD (gastroesophageal reflux disease)    Gout    History of adenomatous polyps of colon 12/29/2019   History of nuclear stress test    Myoview 11/17: EF 54, no ST changes, hypertensive blood pressure response, no ischemia, low risk   Hyperlipidemia    Hypothyroidism    Nicotine addiction    OSA (obstructive sleep apnea)    Prostatitis    Reactive airway disease    Sarcocystosis    Sinus arrhythmia    Sleep apnea    cpap- not wearing   Tinea corporis    Past Surgical History:  Procedure Laterality Date   BILATERAL KNEE ARTHROSCOPY  right- 1997; left 2001   COLONOSCOPY  multiple last 2013   KNEE SURGERY Right 2014   torn R medial meniscus- arthroscopy done   LAPAROTOMY N/A 04/10/2022   Procedure: EXPLORATORY LAPAROTOMY, TOTAL COLECTOMY, END ILEOSTOMY;  Surgeon: Felicie Morn, MD;  Location: [facility];  Service: General;  Laterality: N/A;   PARATHYROIDECTOMY  2005   ROTATOR CUFF REPAIR  2004   right   TONSILLECTOMY     TOTAL HIP ARTHROPLASTY Left 2009   TOTAL HIP ARTHROPLASTY Right 2006    UPPER GASTROINTESTINAL ENDOSCOPY      Current Outpatient Medications  Medication Sig Dispense Refill   albuterol (VENTOLIN HFA) 108 (90 Base) MCG/ACT inhaler Inhale 2 puffs into the lungs every 6 (six) hours as needed for wheezing or shortness of breath. 8 g 2   allopurinol (ZYLOPRIM) 300 MG tablet Take 300 mg by mouth at bedtime.     apixaban (ELIQUIS) 5 MG TABS tablet Take 1 tablet (5 mg total) by mouth 2 (two) times daily. 60 tablet 0   CRESTOR 20 MG tablet Take 20 mg by mouth daily.      fluticasone (FLONASE) 50 MCG/ACT nasal spray INSTILL 2 SPRAYS IN EACH NOSTRIL DAILY     levothyroxine (SYNTHROID, LEVOTHROID) 50 MCG tablet Take 50 mcg by mouth daily.     loperamide (IMODIUM) 2 MG capsule Take 2 capsules (4 mg total) by mouth every 8 (eight) hours as needed for diarrhea or loose stools. 30 capsule 0   metoprolol tartrate (LOPRESSOR) 50 MG tablet Take 1 tablet (50 mg total) by mouth 2 (two) times daily. 60 tablet 0   omeprazole (PRILOSEC) 40 MG capsule Take 40 mg by mouth 2 (two) times daily.     oxyCODONE (OXY IR/ROXICODONE) 5 MG immediate release tablet Take 1 tablet (5 mg total) by mouth every 6 (six) hours as needed for moderate pain, severe pain or breakthrough pain. 20 tablet 0   oxyCODONE-acetaminophen (PERCOCET) 10-325 MG tablet Take 1 tablet by mouth every 4 (four) hours as needed for pain.     polycarbophil (FIBERCON) 625 MG tablet Take 1 tablet (625 mg total) by mouth 2 (two) times daily. 60 tablet 0   Probiotic Product (ALIGN) 4 MG CAPS Take 1 capsule by mouth daily.     No current facility-administered medications for this encounter.    Allergies  Allergen Reactions   Bee Venom     Other reaction(s): Unknown   Lisinopril     Other reaction(s): Unknown   Metoclopramide Other (See Comments)    Other reaction(s): shaking too bad "Sweat like crazy"    Penicillins Hives    Did it involve swelling of the face/tongue/throat, SOB, or low BP? Yes Did it involve sudden or  severe rash/hives, skin peeling, or any reaction on the inside of your mouth or nose? Yes Did you need to seek medical attention at a hospital or doctor's office? No When did it last happen?  childhood      If all above answers are "NO", may proceed with cephalosporin use.     Shellfish Allergy Swelling    Social History   Socioeconomic History   Marital status: Married    Spouse name: Not on file   Number of children: 0   Years of education: Not on file   Highest education level: Not on file  Occupational History   Occupation: retired Therapist, sports  Tobacco Use   Smoking status: Former    Packs/day: 0.50    Types:  Cigarettes   Smokeless tobacco: Never   Tobacco comments:    Former smoker 05/03/22  Vaping Use   Vaping Use: Never used  Substance and Sexual Activity   Alcohol use: Not Currently    Alcohol/week: 21.0 standard drinks of alcohol    Types: 21 Cans of beer per week    Comment: stopped drinking 05/03/22   Drug use: No   Sexual activity: Not on file  Other Topics Concern   Not on file  Social History Narrative   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   Single   No children   4 years in [employer], [place]   Former smoker 1 alcoholic beverage daily no other tobacco or drug use   Social Determinants of Radio broadcast assistant Strain: Not on file  Food Insecurity: No Food Insecurity (04/09/2022)   Hunger Vital Sign    Worried About Running Out of Food in the Last Year: Never true    [place] in the Last Year: Never true  Transportation Needs: No Transportation Needs (04/09/2022)   PRAPARE - Hydrologist (Medical): No    Lack of Transportation (Non-Medical): No  Physical Activity: Not on file  Stress: Not on file  Social Connections: Not on file  Intimate Partner Violence: Not At Risk (04/09/2022)   Humiliation, Afraid, Rape, and Kick questionnaire    Fear of Current or Ex-Partner: No     Emotionally Abused: No    Physically Abused: No    Sexually Abused: No     ROS- All systems are reviewed and negative except as per the HPI above.  Physical Exam: Vitals:   05/03/22 1059  BP: 110/68  Pulse: 67  Weight: 96.8 kg  Height: '5\' 10"'$  (1.778 m)    GEN- The patient is a well appearing male, alert and oriented x 3 today.   Head- normocephalic, atraumatic Eyes-  Sclera clear, conjunctiva pink Ears- hearing intact Oropharynx- clear Neck- supple  Lungs- Clear to ausculation bilaterally, normal work of breathing Heart- Regular rate and rhythm, no murmurs, rubs or gallops  GI- soft, NT, ND, + BS Extremities- no clubbing, cyanosis, or edema MS- no significant deformity or atrophy Skin- no rash or lesion Psych- euthymic mood, full affect Neuro- strength and sensation are intact  Wt Readings from Last 3 Encounters:  05/03/22 96.8 kg  04/22/22 106.3 kg  02/14/22 106.6 kg    EKG today demonstrates  SR Vent. rate 76 BPM PR interval 150 ms QRS duration 70 ms QT/QTcB 386/434 ms  Echo 04/09/22 demonstrated   1. Left ventricular ejection fraction, by estimation, is 60 to 65%. The  left ventricle has normal function. The left ventricle has no regional  wall motion abnormalities. Left ventricular diastolic parameters are  consistent with Grade I diastolic dysfunction (impaired relaxation).   2. Right ventricular systolic function is normal. The right ventricular  size is mildly enlarged. There is mildly elevated pulmonary artery  systolic pressure. The estimated right ventricular systolic pressure is  16.1 mmHg.   3. The mitral valve is normal in structure. No evidence of mitral valve  regurgitation. No evidence of mitral stenosis.   4. The aortic valve is tricuspid. Aortic valve regurgitation is not  visualized. No aortic stenosis is present.   5. Aortic dilatation noted. There is mild dilatation of the ascending  aorta, measuring 40 mm.   6. The inferior vena cava  is normal in size  with greater than 50%  respiratory variability, suggesting right atrial pressure of 3 mmHg.   Epic records are reviewed at length today  CHA2DS2-VASc Score = 2  The patient's score is based upon: CHF History: 0 HTN History: 1 Diabetes History: 1 Stroke History: 0 Vascular Disease History: 0 Age Score: 0 Gender Score: 0       ASSESSMENT AND PLAN: 1. Paroxysmal Atrial Fibrillation (ICD10:  I48.0) The patient's CHA2DS2-VASc score is 2, indicating a 2.2% annual risk of stroke.   General education about afib provided and questions answered. We also discussed his stroke risk and the risks and benefits of anticoagulation. His ECG from the hospital appears more consistent with sinus tach with PACs. Telemetry rhythm strips unavailable for review.  Will have him wear a two week Zio monitor to evaluate for arrhythmia.  Continue Eliquis 5 mg BID (taking for DVT/PE) Continue Lopressor 50 mg BID  2. Secondary Hypercoagulable State (ICD10:  D68.69) The patient is at significant risk for stroke/thromboembolism based upon his CHA2DS2-VASc Score of 2.  Continue Apixaban (Eliquis).   3. HTN Stable, no changes today.  4. Obstructive sleep apnea Not currently on CPAP   Follow up with Dr Tamala Julian or APP in 4-6 weeks. AF clinic pending monitor results.    [facility] [address] [place], [place] [address] [phone] 05/03/2022 11:31 AM

## 2022-05-07 DIAGNOSIS — R251 Tremor, unspecified: Secondary | ICD-10-CM | POA: Diagnosis not present

## 2022-05-07 DIAGNOSIS — E559 Vitamin D deficiency, unspecified: Secondary | ICD-10-CM | POA: Diagnosis not present

## 2022-05-07 DIAGNOSIS — K219 Gastro-esophageal reflux disease without esophagitis: Secondary | ICD-10-CM | POA: Diagnosis not present

## 2022-05-07 DIAGNOSIS — E039 Hypothyroidism, unspecified: Secondary | ICD-10-CM | POA: Diagnosis not present

## 2022-05-07 DIAGNOSIS — M109 Gout, unspecified: Secondary | ICD-10-CM | POA: Diagnosis not present

## 2022-05-07 DIAGNOSIS — I82411 Acute embolism and thrombosis of right femoral vein: Secondary | ICD-10-CM | POA: Diagnosis not present

## 2022-05-07 DIAGNOSIS — G8929 Other chronic pain: Secondary | ICD-10-CM | POA: Diagnosis not present

## 2022-05-07 DIAGNOSIS — J4489 Other specified chronic obstructive pulmonary disease: Secondary | ICD-10-CM | POA: Diagnosis not present

## 2022-05-07 DIAGNOSIS — M5432 Sciatica, left side: Secondary | ICD-10-CM | POA: Diagnosis not present

## 2022-05-07 DIAGNOSIS — E7800 Pure hypercholesterolemia, unspecified: Secondary | ICD-10-CM | POA: Diagnosis not present

## 2022-05-07 DIAGNOSIS — M5431 Sciatica, right side: Secondary | ICD-10-CM | POA: Diagnosis not present

## 2022-05-07 DIAGNOSIS — Z48815 Encounter for surgical aftercare following surgery on the digestive system: Secondary | ICD-10-CM | POA: Diagnosis not present

## 2022-05-07 DIAGNOSIS — G4733 Obstructive sleep apnea (adult) (pediatric): Secondary | ICD-10-CM | POA: Diagnosis not present

## 2022-05-07 DIAGNOSIS — Z4789 Encounter for other orthopedic aftercare: Secondary | ICD-10-CM | POA: Diagnosis not present

## 2022-05-07 DIAGNOSIS — K589 Irritable bowel syndrome without diarrhea: Secondary | ICD-10-CM | POA: Diagnosis not present

## 2022-05-07 DIAGNOSIS — M4726 Other spondylosis with radiculopathy, lumbar region: Secondary | ICD-10-CM | POA: Diagnosis not present

## 2022-05-07 DIAGNOSIS — I10 Essential (primary) hypertension: Secondary | ICD-10-CM | POA: Diagnosis not present

## 2022-05-07 DIAGNOSIS — M48062 Spinal stenosis, lumbar region with neurogenic claudication: Secondary | ICD-10-CM | POA: Diagnosis not present

## 2022-05-07 DIAGNOSIS — Z432 Encounter for attention to ileostomy: Secondary | ICD-10-CM | POA: Diagnosis not present

## 2022-05-07 DIAGNOSIS — E785 Hyperlipidemia, unspecified: Secondary | ICD-10-CM | POA: Diagnosis not present

## 2022-05-07 DIAGNOSIS — J45909 Unspecified asthma, uncomplicated: Secondary | ICD-10-CM | POA: Diagnosis not present

## 2022-05-07 DIAGNOSIS — I2699 Other pulmonary embolism without acute cor pulmonale: Secondary | ICD-10-CM | POA: Diagnosis not present

## 2022-05-07 DIAGNOSIS — E119 Type 2 diabetes mellitus without complications: Secondary | ICD-10-CM | POA: Diagnosis not present

## 2022-05-07 DIAGNOSIS — M1990 Unspecified osteoarthritis, unspecified site: Secondary | ICD-10-CM | POA: Diagnosis not present

## 2022-05-07 DIAGNOSIS — M4722 Other spondylosis with radiculopathy, cervical region: Secondary | ICD-10-CM | POA: Diagnosis not present

## 2022-05-09 DIAGNOSIS — M5431 Sciatica, right side: Secondary | ICD-10-CM | POA: Diagnosis not present

## 2022-05-09 DIAGNOSIS — Z4789 Encounter for other orthopedic aftercare: Secondary | ICD-10-CM | POA: Diagnosis not present

## 2022-05-09 DIAGNOSIS — Z432 Encounter for attention to ileostomy: Secondary | ICD-10-CM | POA: Diagnosis not present

## 2022-05-09 DIAGNOSIS — M48062 Spinal stenosis, lumbar region with neurogenic claudication: Secondary | ICD-10-CM | POA: Diagnosis not present

## 2022-05-09 DIAGNOSIS — M5432 Sciatica, left side: Secondary | ICD-10-CM | POA: Diagnosis not present

## 2022-05-09 DIAGNOSIS — M4726 Other spondylosis with radiculopathy, lumbar region: Secondary | ICD-10-CM | POA: Diagnosis not present

## 2022-05-11 DIAGNOSIS — Z432 Encounter for attention to ileostomy: Secondary | ICD-10-CM | POA: Diagnosis not present

## 2022-05-11 DIAGNOSIS — M48062 Spinal stenosis, lumbar region with neurogenic claudication: Secondary | ICD-10-CM | POA: Diagnosis not present

## 2022-05-11 DIAGNOSIS — Z4789 Encounter for other orthopedic aftercare: Secondary | ICD-10-CM | POA: Diagnosis not present

## 2022-05-11 DIAGNOSIS — M4726 Other spondylosis with radiculopathy, lumbar region: Secondary | ICD-10-CM | POA: Diagnosis not present

## 2022-05-11 DIAGNOSIS — M5432 Sciatica, left side: Secondary | ICD-10-CM | POA: Diagnosis not present

## 2022-05-11 DIAGNOSIS — M5431 Sciatica, right side: Secondary | ICD-10-CM | POA: Diagnosis not present

## 2022-05-15 DIAGNOSIS — Z432 Encounter for attention to ileostomy: Secondary | ICD-10-CM | POA: Diagnosis not present

## 2022-05-15 DIAGNOSIS — Z4789 Encounter for other orthopedic aftercare: Secondary | ICD-10-CM | POA: Diagnosis not present

## 2022-05-15 DIAGNOSIS — M48062 Spinal stenosis, lumbar region with neurogenic claudication: Secondary | ICD-10-CM | POA: Diagnosis not present

## 2022-05-15 DIAGNOSIS — M5431 Sciatica, right side: Secondary | ICD-10-CM | POA: Diagnosis not present

## 2022-05-15 DIAGNOSIS — M5432 Sciatica, left side: Secondary | ICD-10-CM | POA: Diagnosis not present

## 2022-05-15 DIAGNOSIS — M4726 Other spondylosis with radiculopathy, lumbar region: Secondary | ICD-10-CM | POA: Diagnosis not present

## 2022-05-16 DIAGNOSIS — M5431 Sciatica, right side: Secondary | ICD-10-CM | POA: Diagnosis not present

## 2022-05-16 DIAGNOSIS — Z4789 Encounter for other orthopedic aftercare: Secondary | ICD-10-CM | POA: Diagnosis not present

## 2022-05-16 DIAGNOSIS — Z432 Encounter for attention to ileostomy: Secondary | ICD-10-CM | POA: Diagnosis not present

## 2022-05-16 DIAGNOSIS — M48062 Spinal stenosis, lumbar region with neurogenic claudication: Secondary | ICD-10-CM | POA: Diagnosis not present

## 2022-05-16 DIAGNOSIS — M4726 Other spondylosis with radiculopathy, lumbar region: Secondary | ICD-10-CM | POA: Diagnosis not present

## 2022-05-16 DIAGNOSIS — M5432 Sciatica, left side: Secondary | ICD-10-CM | POA: Diagnosis not present

## 2022-05-22 DIAGNOSIS — M4726 Other spondylosis with radiculopathy, lumbar region: Secondary | ICD-10-CM | POA: Diagnosis not present

## 2022-05-22 DIAGNOSIS — Z4789 Encounter for other orthopedic aftercare: Secondary | ICD-10-CM | POA: Diagnosis not present

## 2022-05-22 DIAGNOSIS — M48062 Spinal stenosis, lumbar region with neurogenic claudication: Secondary | ICD-10-CM | POA: Diagnosis not present

## 2022-05-22 DIAGNOSIS — M5431 Sciatica, right side: Secondary | ICD-10-CM | POA: Diagnosis not present

## 2022-05-22 DIAGNOSIS — Z432 Encounter for attention to ileostomy: Secondary | ICD-10-CM | POA: Diagnosis not present

## 2022-05-22 DIAGNOSIS — M5432 Sciatica, left side: Secondary | ICD-10-CM | POA: Diagnosis not present

## 2022-05-23 ENCOUNTER — Other Ambulatory Visit: Payer: Self-pay

## 2022-05-23 MED ORDER — METOPROLOL TARTRATE 50 MG PO TABS: 50.0000 mg | ORAL_TABLET | Freq: Two times a day (BID) | ORAL | 0 refills | Status: DC

## 2022-05-23 NOTE — Progress Notes (Signed)
Fax received for medical clearance/medication hold for Dr. Cain.  Provider signed, form faxed back to sender, verified successful, sent to scan center.  

## 2022-05-24 ENCOUNTER — Encounter (HOSPITAL_COMMUNITY): Payer: Self-pay | Admitting: *Deleted

## 2022-05-24 DIAGNOSIS — I4891 Unspecified atrial fibrillation: Secondary | ICD-10-CM | POA: Diagnosis not present

## 2022-05-24 NOTE — Addendum Note (Signed)
Encounter addended by: Juluis Mire, RN on: 05/24/2022 2:07 PM  Actions taken: Imaging Exam ended

## 2022-05-25 DIAGNOSIS — M4726 Other spondylosis with radiculopathy, lumbar region: Secondary | ICD-10-CM | POA: Diagnosis not present

## 2022-05-25 DIAGNOSIS — Z4789 Encounter for other orthopedic aftercare: Secondary | ICD-10-CM | POA: Diagnosis not present

## 2022-05-25 DIAGNOSIS — M5432 Sciatica, left side: Secondary | ICD-10-CM | POA: Diagnosis not present

## 2022-05-25 DIAGNOSIS — M48062 Spinal stenosis, lumbar region with neurogenic claudication: Secondary | ICD-10-CM | POA: Diagnosis not present

## 2022-05-25 DIAGNOSIS — Z432 Encounter for attention to ileostomy: Secondary | ICD-10-CM | POA: Diagnosis not present

## 2022-05-25 DIAGNOSIS — M5431 Sciatica, right side: Secondary | ICD-10-CM | POA: Diagnosis not present

## 2022-05-28 DIAGNOSIS — M4726 Other spondylosis with radiculopathy, lumbar region: Secondary | ICD-10-CM | POA: Diagnosis not present

## 2022-05-28 DIAGNOSIS — M5431 Sciatica, right side: Secondary | ICD-10-CM | POA: Diagnosis not present

## 2022-05-28 DIAGNOSIS — M5432 Sciatica, left side: Secondary | ICD-10-CM | POA: Diagnosis not present

## 2022-05-28 DIAGNOSIS — M48062 Spinal stenosis, lumbar region with neurogenic claudication: Secondary | ICD-10-CM | POA: Diagnosis not present

## 2022-05-28 DIAGNOSIS — Z432 Encounter for attention to ileostomy: Secondary | ICD-10-CM | POA: Diagnosis not present

## 2022-05-28 DIAGNOSIS — Z4789 Encounter for other orthopedic aftercare: Secondary | ICD-10-CM | POA: Diagnosis not present

## 2022-05-29 DIAGNOSIS — M5431 Sciatica, right side: Secondary | ICD-10-CM | POA: Diagnosis not present

## 2022-05-29 DIAGNOSIS — M48062 Spinal stenosis, lumbar region with neurogenic claudication: Secondary | ICD-10-CM | POA: Diagnosis not present

## 2022-05-29 DIAGNOSIS — M5432 Sciatica, left side: Secondary | ICD-10-CM | POA: Diagnosis not present

## 2022-05-29 DIAGNOSIS — M4726 Other spondylosis with radiculopathy, lumbar region: Secondary | ICD-10-CM | POA: Diagnosis not present

## 2022-05-29 DIAGNOSIS — Z432 Encounter for attention to ileostomy: Secondary | ICD-10-CM | POA: Diagnosis not present

## 2022-05-29 DIAGNOSIS — Z4789 Encounter for other orthopedic aftercare: Secondary | ICD-10-CM | POA: Diagnosis not present

## 2022-05-30 DIAGNOSIS — Z432 Encounter for attention to ileostomy: Secondary | ICD-10-CM | POA: Diagnosis not present

## 2022-05-30 DIAGNOSIS — M5431 Sciatica, right side: Secondary | ICD-10-CM | POA: Diagnosis not present

## 2022-05-30 DIAGNOSIS — M5432 Sciatica, left side: Secondary | ICD-10-CM | POA: Diagnosis not present

## 2022-05-30 DIAGNOSIS — M4726 Other spondylosis with radiculopathy, lumbar region: Secondary | ICD-10-CM | POA: Diagnosis not present

## 2022-05-30 DIAGNOSIS — M48062 Spinal stenosis, lumbar region with neurogenic claudication: Secondary | ICD-10-CM | POA: Diagnosis not present

## 2022-05-30 DIAGNOSIS — Z4789 Encounter for other orthopedic aftercare: Secondary | ICD-10-CM | POA: Diagnosis not present

## 2022-06-04 DIAGNOSIS — M4726 Other spondylosis with radiculopathy, lumbar region: Secondary | ICD-10-CM | POA: Diagnosis not present

## 2022-06-04 DIAGNOSIS — M5432 Sciatica, left side: Secondary | ICD-10-CM | POA: Diagnosis not present

## 2022-06-04 DIAGNOSIS — M48062 Spinal stenosis, lumbar region with neurogenic claudication: Secondary | ICD-10-CM | POA: Diagnosis not present

## 2022-06-04 DIAGNOSIS — Z4789 Encounter for other orthopedic aftercare: Secondary | ICD-10-CM | POA: Diagnosis not present

## 2022-06-04 DIAGNOSIS — Z432 Encounter for attention to ileostomy: Secondary | ICD-10-CM | POA: Diagnosis not present

## 2022-06-04 DIAGNOSIS — M5431 Sciatica, right side: Secondary | ICD-10-CM | POA: Diagnosis not present

## 2022-06-05 DIAGNOSIS — M48062 Spinal stenosis, lumbar region with neurogenic claudication: Secondary | ICD-10-CM | POA: Diagnosis not present

## 2022-06-05 DIAGNOSIS — M5431 Sciatica, right side: Secondary | ICD-10-CM | POA: Diagnosis not present

## 2022-06-05 DIAGNOSIS — M4726 Other spondylosis with radiculopathy, lumbar region: Secondary | ICD-10-CM | POA: Diagnosis not present

## 2022-06-05 DIAGNOSIS — Z4789 Encounter for other orthopedic aftercare: Secondary | ICD-10-CM | POA: Diagnosis not present

## 2022-06-05 DIAGNOSIS — M5432 Sciatica, left side: Secondary | ICD-10-CM | POA: Diagnosis not present

## 2022-06-05 DIAGNOSIS — Z432 Encounter for attention to ileostomy: Secondary | ICD-10-CM | POA: Diagnosis not present

## 2022-06-06 DIAGNOSIS — E7800 Pure hypercholesterolemia, unspecified: Secondary | ICD-10-CM | POA: Diagnosis not present

## 2022-06-06 DIAGNOSIS — M5432 Sciatica, left side: Secondary | ICD-10-CM | POA: Diagnosis not present

## 2022-06-06 DIAGNOSIS — Z9049 Acquired absence of other specified parts of digestive tract: Secondary | ICD-10-CM | POA: Diagnosis not present

## 2022-06-06 DIAGNOSIS — M5431 Sciatica, right side: Secondary | ICD-10-CM | POA: Diagnosis not present

## 2022-06-06 DIAGNOSIS — M48062 Spinal stenosis, lumbar region with neurogenic claudication: Secondary | ICD-10-CM | POA: Diagnosis not present

## 2022-06-06 DIAGNOSIS — G4733 Obstructive sleep apnea (adult) (pediatric): Secondary | ICD-10-CM | POA: Diagnosis not present

## 2022-06-06 DIAGNOSIS — J4489 Other specified chronic obstructive pulmonary disease: Secondary | ICD-10-CM | POA: Diagnosis not present

## 2022-06-06 DIAGNOSIS — K219 Gastro-esophageal reflux disease without esophagitis: Secondary | ICD-10-CM | POA: Diagnosis not present

## 2022-06-06 DIAGNOSIS — E039 Hypothyroidism, unspecified: Secondary | ICD-10-CM | POA: Diagnosis not present

## 2022-06-06 DIAGNOSIS — Z432 Encounter for attention to ileostomy: Secondary | ICD-10-CM | POA: Diagnosis not present

## 2022-06-06 DIAGNOSIS — I2699 Other pulmonary embolism without acute cor pulmonale: Secondary | ICD-10-CM | POA: Diagnosis not present

## 2022-06-06 DIAGNOSIS — Z48815 Encounter for surgical aftercare following surgery on the digestive system: Secondary | ICD-10-CM | POA: Diagnosis not present

## 2022-06-06 DIAGNOSIS — I10 Essential (primary) hypertension: Secondary | ICD-10-CM | POA: Diagnosis not present

## 2022-06-06 DIAGNOSIS — M4722 Other spondylosis with radiculopathy, cervical region: Secondary | ICD-10-CM | POA: Diagnosis not present

## 2022-06-06 DIAGNOSIS — M4726 Other spondylosis with radiculopathy, lumbar region: Secondary | ICD-10-CM | POA: Diagnosis not present

## 2022-06-06 DIAGNOSIS — K589 Irritable bowel syndrome without diarrhea: Secondary | ICD-10-CM | POA: Diagnosis not present

## 2022-06-06 DIAGNOSIS — E119 Type 2 diabetes mellitus without complications: Secondary | ICD-10-CM | POA: Diagnosis not present

## 2022-06-06 DIAGNOSIS — R251 Tremor, unspecified: Secondary | ICD-10-CM | POA: Diagnosis not present

## 2022-06-06 DIAGNOSIS — G8929 Other chronic pain: Secondary | ICD-10-CM | POA: Diagnosis not present

## 2022-06-06 DIAGNOSIS — E785 Hyperlipidemia, unspecified: Secondary | ICD-10-CM | POA: Diagnosis not present

## 2022-06-06 DIAGNOSIS — M109 Gout, unspecified: Secondary | ICD-10-CM | POA: Diagnosis not present

## 2022-06-06 DIAGNOSIS — M1990 Unspecified osteoarthritis, unspecified site: Secondary | ICD-10-CM | POA: Diagnosis not present

## 2022-06-06 DIAGNOSIS — Z4789 Encounter for other orthopedic aftercare: Secondary | ICD-10-CM | POA: Diagnosis not present

## 2022-06-06 DIAGNOSIS — E559 Vitamin D deficiency, unspecified: Secondary | ICD-10-CM | POA: Diagnosis not present

## 2022-06-06 DIAGNOSIS — I82411 Acute embolism and thrombosis of right femoral vein: Secondary | ICD-10-CM | POA: Diagnosis not present

## 2022-06-10 NOTE — Progress Notes (Deleted)
Cardiology Office Note:    Date:  06/10/2022   ID:  Kyle, Delacruz 24-Jul-1955, MRN [id number]  PCP:  Prince Solian, MD  [facility] Providers Cardiologist:  Sinclair Grooms, MD { Click to update primary MD,subspecialty MD or APP then REFRESH:1}  *** Referring MD: Prince Solian, MD   Chief Complaint:  No chief complaint on file. {Click here for Visit Info    :1}   Patient Profile: FHx of CAD Myoview in 2017: low risk  Pre-Diabetes mellitus  Hyperlipidemia  Syncope  Echocardiogram 10/22: EF 60 Monitor 12/22: no arrhythmias  Hx of pulmonary embolism in March 2023 Echocardiogram 3/23: EF 60-65, normal RVSF  OSA +Cigs Hypothyroidism  GERD  Prostate CA  Aortic atherosclerosis  TTE 04/09/2022: EF 60-65, no RWMA, GR 1 DD, normal RVSF, mildly elevated PASP (RVSP 37.1), mild dilation of ascending aorta (40 mm), RAP 3  Limited TTE 04/10/2022 RV does not appear to be dilated or severely depressed  ZIO monitor 05/24/2022: Sinus rhythm, multiple SVT episodes (longest 30 seconds, fastest 211 at 17 beats), 8.6 supraventricular ectopy, 1.4% ventricular ectopy   Prior CV Studies: GATED SPECT MYO PERF W/EXERCISE STRESS 1D 03/21/2016 EF 54, normal perfusion; low risk   ECHO COMPLETE WITH IMAGING ENHANCING AGENT 07/27/2021 EF 60-65, global HK, mild LVH, normal RVSF, trivial MR   ZIO MONITOR 05/02/2021 Overall normal study with low grade burden of PACs and PVCs.  VAS US CAROTID DUPLEX BILATERAL 03/11/2021 Right Carotid: Velocities in the right ICA are consistent with a 1-39% stenosis. Left Carotid: Velocities in the left ICA are consistent with a 1-39% stenosis. Cardiac Studies & Procedures     STRESS TESTS  MYOCARDIAL PERFUSION IMAGING 03/21/2016  Narrative  The left ventricular ejection fraction is mildly decreased (45-54%).  Nuclear stress EF: 54%.  There was no ST segment deviation noted during stress.  Blood pressure demonstrated a hypertensive  response to exercise.  The study is normal.  This is a low risk study.   ECHOCARDIOGRAM  ECHOCARDIOGRAM LIMITED 04/10/2022  Narrative ECHOCARDIOGRAM LIMITED REPORT    Patient Name:   Kyle Delacruz Date of Exam: 04/10/2022 Medical Rec #:  [id number]       Height:       69.0 in Accession #:    [id number]      Weight:       250.2 lb Date of Birth:  1955-06-14       BSA:          2.272 m Patient Age:    67 years        BP:           113/69 mmHg Patient Gender: M               HR:           138 bpm. Exam Location:  Inpatient  Procedure: Limited Echo  STAT ECHO  Indications:    Pulmonary embolus  History:        Patient has prior history of Echocardiogram examinations, most recent 04/09/2022. Abnormal ECG, COPD, Signs/Symptoms:Dyspnea and Shortness of Breath; Risk Factors:Hypertension, Dyslipidemia, Current Smoker and Sleep Apnea.  Sonographer:    Eartha Inch Referring Phys: [id number] Candee Furbish   Sonographer Comments: Technically challenging study due to limited acoustic windows. Image acquisition challenging due to patient body habitus.  Comparison(s): Very brief, limited 2D only study for right ventricular assessment in a patient with pulmonary embolism. Apical image is very poor. Based on  parasternal images, the right ventricle does not appear to be dilated or severely depressed.  Dani Gobble Croitoru MD Electronically signed by Sanda Klein MD Signature Date/Time: 04/10/2022/4:52:57 PM    Final    MONITORS  LONG TERM MONITOR (3-14 DAYS) 05/24/2022  Narrative Patch Wear Time:  13 days and 22 hours  Rhythm was sinus rhythm Multiple SVT episodes, longest 30 seconds at 137 bpm, fastest 17 beats at 211 bpm 8.6% supraventricular ectopy 1.4% ventricular ectopy No triggered episodes recorded  Will Camnitz, MD           History of Present Illness:   Kyle Delacruz is a 67 y.o. male with the above problem list.  He ***      {EKG done?:28833::"EKG: ***"}      Reviewed and updated this encounter:***     ROS  Labs/Other Test Reviewed:   Recent Labs: 07/27/2021: ALT 13; TSH 0.936 04/08/2022: B Natriuretic Peptide 40.5 04/20/2022: Magnesium 2.0 04/21/2022: BUN 13; Creatinine, Ser 0.89; Potassium 3.6; Sodium 137 04/22/2022: Hemoglobin 8.7; Platelets 442   Recent Lipid Panel No results for input(s): "CHOL", "TRIG", "HDL", "VLDL", "LDLCALC", "LDLDIRECT" in the last 8760 hours.   Risk Assessment/Calculations/Metrics:   {Does this patient have ATRIAL FIBRILLATION?:[phone]}     No BP recorded.  {Refresh Note OR Click here to enter BP  :1}***   Physical Exam:   VS:  There were no vitals taken for this visit.   Wt Readings from Last 3 Encounters:  05/03/22 213 lb 6.4 oz (96.8 kg)  04/22/22 234 lb 5.6 oz (106.3 kg)  02/14/22 235 lb (106.6 kg)    Physical Exam ***     ASSESSMENT & PLAN:   No problem-specific Assessment & Plan notes found for this encounter.  {  Preoperative cardiovascular examination Kyle Delacruz perioperative risk of a major cardiac event is 0.4% according to the Revised Cardiac Risk Index (RCRI).  Therefore, he is at low risk for perioperative complications.    Recommendations: According to ACC/AHA guidelines, no further cardiovascular testing needed.  The patient may proceed to surgery at acceptable risk.   Antiplatelet and/or Anticoagulation Recommendations: His anticoagulation was prescribed for pulmonary embolism. Management per primary care. However, he tells me this was stopped recently.     :1}       {Are you ordering a CV Procedure (e.g. stress test, cath, DCCV, TEE, etc)?   Press F2        :[id number]   Dispo:  No follow-ups on file.   {Medication Adjustments/Labs and Tests Ordered: Current medicines are reviewed at length with the patient today.  Concerns regarding medicines are outlined above.  Tests Ordered: No orders of the defined types were placed in this encounter.  Medication Changes: No orders of  the defined types were placed in this encounter.       :1}    Signed, Richardson Dopp, PA-C  06/10/2022 10:02 AM    [address], [place], [place]  [address] Phone: [phone]; Fax: [phone]

## 2022-06-11 ENCOUNTER — Ambulatory Visit: Payer: Medicare Other | Admitting: Physician Assistant

## 2022-06-12 DIAGNOSIS — J4489 Other specified chronic obstructive pulmonary disease: Secondary | ICD-10-CM | POA: Diagnosis not present

## 2022-06-12 DIAGNOSIS — I82411 Acute embolism and thrombosis of right femoral vein: Secondary | ICD-10-CM | POA: Diagnosis not present

## 2022-06-12 DIAGNOSIS — Z432 Encounter for attention to ileostomy: Secondary | ICD-10-CM | POA: Diagnosis not present

## 2022-06-12 DIAGNOSIS — I2699 Other pulmonary embolism without acute cor pulmonale: Secondary | ICD-10-CM | POA: Diagnosis not present

## 2022-06-12 DIAGNOSIS — E119 Type 2 diabetes mellitus without complications: Secondary | ICD-10-CM | POA: Diagnosis not present

## 2022-06-12 DIAGNOSIS — Z4789 Encounter for other orthopedic aftercare: Secondary | ICD-10-CM | POA: Diagnosis not present

## 2022-06-14 DIAGNOSIS — M48062 Spinal stenosis, lumbar region with neurogenic claudication: Secondary | ICD-10-CM | POA: Diagnosis not present

## 2022-06-14 DIAGNOSIS — M4726 Other spondylosis with radiculopathy, lumbar region: Secondary | ICD-10-CM | POA: Diagnosis not present

## 2022-06-14 DIAGNOSIS — Z4789 Encounter for other orthopedic aftercare: Secondary | ICD-10-CM | POA: Diagnosis not present

## 2022-06-14 DIAGNOSIS — Z432 Encounter for attention to ileostomy: Secondary | ICD-10-CM | POA: Diagnosis not present

## 2022-06-14 DIAGNOSIS — M5431 Sciatica, right side: Secondary | ICD-10-CM | POA: Diagnosis not present

## 2022-06-14 DIAGNOSIS — I2699 Other pulmonary embolism without acute cor pulmonale: Secondary | ICD-10-CM | POA: Diagnosis not present

## 2022-06-14 DIAGNOSIS — E119 Type 2 diabetes mellitus without complications: Secondary | ICD-10-CM | POA: Diagnosis not present

## 2022-06-14 DIAGNOSIS — I82411 Acute embolism and thrombosis of right femoral vein: Secondary | ICD-10-CM | POA: Diagnosis not present

## 2022-06-14 DIAGNOSIS — J4489 Other specified chronic obstructive pulmonary disease: Secondary | ICD-10-CM | POA: Diagnosis not present

## 2022-06-18 DIAGNOSIS — Z4789 Encounter for other orthopedic aftercare: Secondary | ICD-10-CM | POA: Diagnosis not present

## 2022-06-18 DIAGNOSIS — Z432 Encounter for attention to ileostomy: Secondary | ICD-10-CM | POA: Diagnosis not present

## 2022-06-18 DIAGNOSIS — J4489 Other specified chronic obstructive pulmonary disease: Secondary | ICD-10-CM | POA: Diagnosis not present

## 2022-06-18 DIAGNOSIS — I480 Paroxysmal atrial fibrillation: Secondary | ICD-10-CM | POA: Insufficient documentation

## 2022-06-18 DIAGNOSIS — I82411 Acute embolism and thrombosis of right femoral vein: Secondary | ICD-10-CM | POA: Diagnosis not present

## 2022-06-18 DIAGNOSIS — E119 Type 2 diabetes mellitus without complications: Secondary | ICD-10-CM | POA: Diagnosis not present

## 2022-06-18 DIAGNOSIS — I2699 Other pulmonary embolism without acute cor pulmonale: Secondary | ICD-10-CM | POA: Diagnosis not present

## 2022-06-18 NOTE — Progress Notes (Unsigned)
Cardiology Office Note:    Date:  06/19/2022   ID:  Kyle Delacruz, Kyle Delacruz, Kyle Delacruz, MRN [id number]  PCP:  Prince Solian, [place] Providers Cardiologist:  Sinclair Grooms, MD (Inactive)    Referring MD: Prince Solian, MD    Patient Profile: Paroxysmal atrial fibrillation Occurred during admit for PE, colectomy due to pericolonic hematoma TTE 04/09/2022: EF 60-65, no RWMA, GR 1 DD, normal RVSF, mildly elevated PASP (RVSP 37.1), mild dilation of ascending aorta (40 mm), RAP 3 Limited TTE 04/10/2022 RV does not appear to be dilated or severely depressed ZIO monitor 05/24/2022: NSR, multiple SVT episodes (longest 30 sec, fastest 211 at 17 beats), 8.6% SV ectopy, 1.4% Vent ectopy -  no AFib FHx of CAD Myoview in 2017: low risk  Pre-Diabetes mellitus  Hyperlipidemia  Syncope  Echocardiogram 10/22: EF 60; Monitor 12/22: no arrhythmias  Hx of pulmonary embolism in March 2023; 03/2022 (after back surgery) OSA +Cigs Hypothyroidism  GERD  Prostate CA  Aortic atherosclerosis Carotid US 03/11/21: Bilat ICA 1-39  History of Present Illness:   Kyle Delacruz is a 67 y.o. male with the above problem list.  He was last seen in clinic 02/05/22 for surgical clearance.  After his lumbar surgery, he was admitted 11/26-12/10 with acute submassive pulmonary embolism secondary to right lower extremity DVT complicated by respiratory failure (intubated) and hemorrhagic shock due to large pericolonic hematoma.  He required exploratory laparotomy with total colectomy and end ileostomy.  His hospitalization was complicated by atrial fibrillation.  He was placed on metoprolol for rate control strategy.  He was seen in follow-up at the atrial fibrillation clinic 05/03/2022.  He remained in normal sinus rhythm at that time.  It was noted that he had a lot of sinus tachycardia and PACs on his EKGs in the hospital.  A 2-week ZIO monitor was applied.  This demonstrated sinus rhythm and multiple  SVT episodes, 8.6% supraventricular ectopy and 1.4% ventricular ectopy.  There was no atrial fibrillation detected on his monitor.  As needed follow-up was recommended with atrial fibrillation clinic.    He returns for follow-up of atrial fibrillation. He is doing well. His back is still sore. He has a colostomy. He has not had chest pain, syncope, leg edema. He has rare palpitations. He has shortness of breath with some activities. This is chronic w/o change.       Reviewed and updated this encounter:   Tobacco  Allergies  Meds  Problems  Med Hx  Surg Hx  Fam Hx     Review of Systems  Gastrointestinal:  Negative for hematochezia.  Genitourinary:  Negative for hematuria.    Labs/Other Test Reviewed:   Recent Labs: 07/27/2021: ALT 13; TSH 0.936 04/08/2022: B Natriuretic Peptide 40.5 04/20/2022: Magnesium 2.0 04/21/2022: BUN 13; Creatinine, Ser 0.89; Potassium 3.6; Sodium 137 04/22/2022: Hemoglobin 8.7; Platelets 442   Recent Lipid Panel No results for input(s): "CHOL", "TRIG", "HDL", "VLDL", "LDLCALC", "LDLDIRECT" in the last 8760 hours.   Risk Assessment/Calculations/Metrics:    CHA2DS2-VASc Score = 2   This indicates a 2.2% annual risk of stroke. The patient's score is based upon: CHF History: 0 HTN History: 1 Diabetes History: 1 Stroke History: 0 Vascular Disease History: 0 Age Score: 0 Gender Score: 0            Physical Exam:   VS:  BP 130/80   Pulse 72   Ht '5\' 10"'$  (1.778 m)   Wt 203 lb (92.1  kg)   SpO2 97%   BMI 29.13 kg/m    Wt Readings from Last 3 Encounters:  02/06/Delacruz 203 lb (92.1 kg)  05/03/22 213 lb 6.4 oz (96.8 kg)  04/22/22 234 lb 5.6 oz (106.3 kg)    Constitutional:      Appearance: Healthy appearance. Not in distress.  Pulmonary:     Breath sounds: Normal breath sounds. No wheezing. No rales.  Cardiovascular:     Normal rate. Regular rhythm.     Murmurs: There is no murmur.  Edema:    Peripheral edema absent.  Abdominal:     Comments:  Colostomy bag with stool contents  Musculoskeletal:     Cervical back: Neck supple.        ASSESSMENT & PLAN:   PAF (paroxysmal atrial fibrillation) (HCC) Atrial fibrillation occurred in the setting of acute illness with respiratory failure and hemorrhagic shock. He is already committed to long term anticoagulation due to prior hx of DVT/pulmonary embolism. He had no recurrent atrial fibrillation on his monitor.   SVT (supraventricular tachycardia) He had several SVT episodes on his monitor. He is not symptomatic with this. Continue metoprolol 50 mg twice daily. F/u in 6 mos. In light of Dr. Thompson Caul retirement, I will have him see Dr. Gasper Sells or me in f/u.   Essential hypertension The patient's blood pressure is controlled on his current regimen.  Continue current therapy with metoprolol tartrate 50 mg twice daily.  PE (pulmonary thromboembolism) ([place]) No R heart strain on echocardiogram during admission in 03/2022. He remains on long term anticoagulation with Eliquis.           Dispo:  Return in about 6 months (around 12/18/2022) for Routine Follow Up w/ Dr. Gasper Sells, or Richardson Dopp, PA-C.   Signed, Richardson Dopp, PA-C  06/19/2022 2:30 PM    [address], [place], [place]  [address] Phone: [phone]; Fax: [phone]

## 2022-06-19 ENCOUNTER — Encounter: Payer: Self-pay | Admitting: Physician Assistant

## 2022-06-19 ENCOUNTER — Ambulatory Visit: Payer: Medicare Other | Attending: Physician Assistant | Admitting: Physician Assistant

## 2022-06-19 VITALS — BP 130/80 | HR 72 | Ht 70.0 in | Wt 203.0 lb

## 2022-06-19 DIAGNOSIS — I10 Essential (primary) hypertension: Secondary | ICD-10-CM

## 2022-06-19 DIAGNOSIS — I2699 Other pulmonary embolism without acute cor pulmonale: Secondary | ICD-10-CM | POA: Diagnosis not present

## 2022-06-19 DIAGNOSIS — I471 Supraventricular tachycardia, unspecified: Secondary | ICD-10-CM | POA: Diagnosis not present

## 2022-06-19 DIAGNOSIS — I480 Paroxysmal atrial fibrillation: Secondary | ICD-10-CM

## 2022-06-19 NOTE — Assessment & Plan Note (Addendum)
He had several SVT episodes on his monitor. He is not symptomatic with this. Continue metoprolol 50 mg twice daily. F/u in 6 mos. In light of Dr. Thompson Caul retirement, I will have him see Dr. Gasper Sells or me in f/u.

## 2022-06-19 NOTE — Assessment & Plan Note (Signed)
No R heart strain on echocardiogram during admission in 03/2022. He remains on long term anticoagulation with Eliquis.

## 2022-06-19 NOTE — Assessment & Plan Note (Signed)
The patient's blood pressure is controlled on his current regimen.  Continue current therapy with metoprolol tartrate 50 mg twice daily.

## 2022-06-19 NOTE — Patient Instructions (Signed)
Medication Instructions:  Your physician recommends that you continue on your current medications as directed. Please refer to the Current Medication list given to you today.  *If you need a refill on your cardiac medications before your next appointment, please call your pharmacy*   Lab Work: None ordered  If you have labs (blood work) drawn today and your tests are completely normal, you will receive your results only by: Tat Momoli (if you have MyChart) OR A paper copy in the mail If you have any lab test that is abnormal or we need to change your treatment, we will call you to review the results.   Testing/Procedures: None ordered   Follow-Up: At [facility], you and your health needs are our priority.  As part of our continuing mission to provide you with exceptional heart care, we have created designated Provider Care Teams.  These Care Teams include your primary Cardiologist (physician) and Advanced Practice Providers (APPs -  Physician Assistants and Nurse Practitioners) who all work together to provide you with the care you need, when you need it.  We recommend signing up for the patient portal called "MyChart".  Sign up information is provided on this After Visit Summary.  MyChart is used to connect with patients for Virtual Visits (Telemedicine).  Patients are able to view lab/test results, encounter notes, upcoming appointments, etc.  Non-urgent messages can be sent to your provider as well.   To learn more about what you can do with MyChart, go to [email or web address].    Your next appointment:   6 month(s)  Provider:   Sinclair Grooms, MD (Inactive)  or Richardson Dopp, PA-C         Other Instructions

## 2022-06-19 NOTE — Assessment & Plan Note (Signed)
Atrial fibrillation occurred in the setting of acute illness with respiratory failure and hemorrhagic shock. He is already committed to long term anticoagulation due to prior hx of DVT/pulmonary embolism. He had no recurrent atrial fibrillation on his monitor.

## 2022-06-20 ENCOUNTER — Other Ambulatory Visit: Payer: Self-pay

## 2022-06-20 DIAGNOSIS — J4489 Other specified chronic obstructive pulmonary disease: Secondary | ICD-10-CM | POA: Diagnosis not present

## 2022-06-20 DIAGNOSIS — I2699 Other pulmonary embolism without acute cor pulmonale: Secondary | ICD-10-CM | POA: Diagnosis not present

## 2022-06-20 DIAGNOSIS — Z432 Encounter for attention to ileostomy: Secondary | ICD-10-CM | POA: Diagnosis not present

## 2022-06-20 DIAGNOSIS — E119 Type 2 diabetes mellitus without complications: Secondary | ICD-10-CM | POA: Diagnosis not present

## 2022-06-20 DIAGNOSIS — I82411 Acute embolism and thrombosis of right femoral vein: Secondary | ICD-10-CM | POA: Diagnosis not present

## 2022-06-20 DIAGNOSIS — Z4789 Encounter for other orthopedic aftercare: Secondary | ICD-10-CM | POA: Diagnosis not present

## 2022-06-20 MED ORDER — METOPROLOL TARTRATE 50 MG PO TABS: 50.0000 mg | ORAL_TABLET | Freq: Two times a day (BID) | ORAL | 3 refills | Status: DC

## 2022-06-21 DIAGNOSIS — J4489 Other specified chronic obstructive pulmonary disease: Secondary | ICD-10-CM | POA: Diagnosis not present

## 2022-06-21 DIAGNOSIS — Z432 Encounter for attention to ileostomy: Secondary | ICD-10-CM | POA: Diagnosis not present

## 2022-06-21 DIAGNOSIS — E119 Type 2 diabetes mellitus without complications: Secondary | ICD-10-CM | POA: Diagnosis not present

## 2022-06-21 DIAGNOSIS — I82411 Acute embolism and thrombosis of right femoral vein: Secondary | ICD-10-CM | POA: Diagnosis not present

## 2022-06-21 DIAGNOSIS — Z4789 Encounter for other orthopedic aftercare: Secondary | ICD-10-CM | POA: Diagnosis not present

## 2022-06-21 DIAGNOSIS — I2699 Other pulmonary embolism without acute cor pulmonale: Secondary | ICD-10-CM | POA: Diagnosis not present

## 2022-06-22 IMAGING — US US EXTREM LOW VENOUS*R*
1 series · 13 of 24 positions shown · non-contrast
Comparison: None.

CLINICAL DATA: Right lower extremity swelling and redness

Shortness of breath
Pain
EXAM:
RIGHT LOWER EXTREMITY VENOUS DOPPLER ULTRASOUND
TECHNIQUE: Gray-scale sonography with graded compression, as well as color
Doppler and duplex ultrasound were performed to evaluate the lower
extremity deep venous systems from the level of the common femoral
vein and including the common femoral, femoral, profunda femoral,
popliteal and calf veins including the posterior tibial, peroneal
and gastrocnemius veins when visible. The superficial great
saphenous vein was also interrogated. Spectral Doppler was utilized
to evaluate flow at rest and with distal augmentation maneuvers in
the common femoral, femoral and popliteal veins.

[Series 1: us venous img lower uni right (dvt) · portal-venous · 13 of 37 slices shown]
[im 1/37]
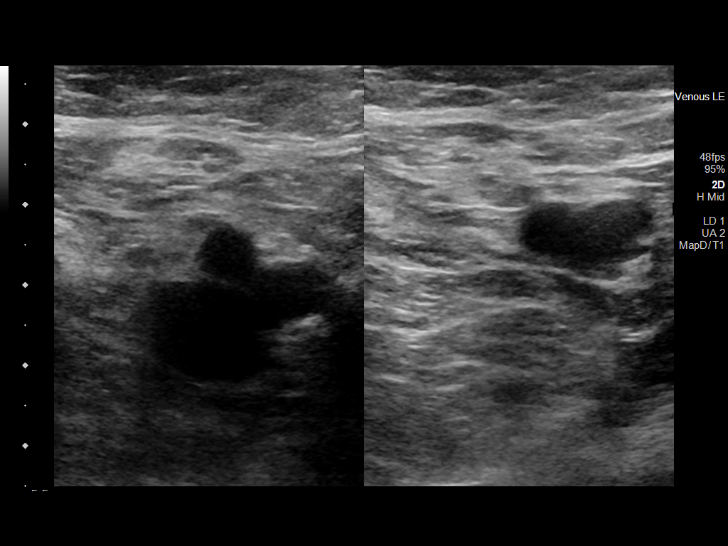
[im 4/37]
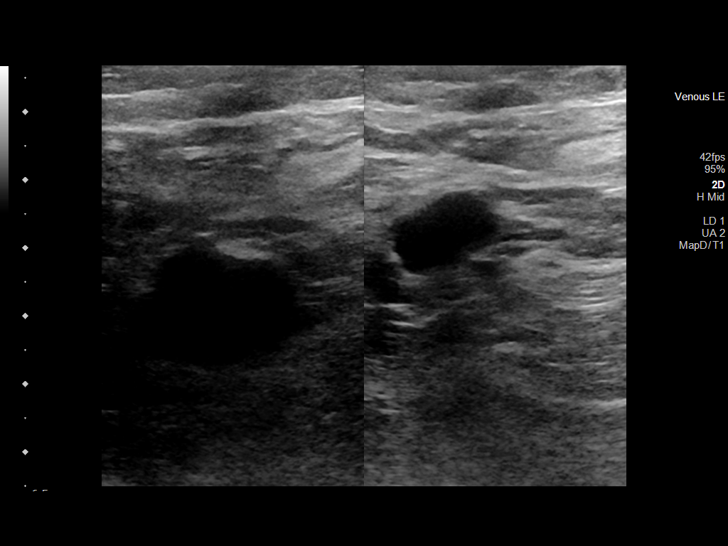
[im 7/37]
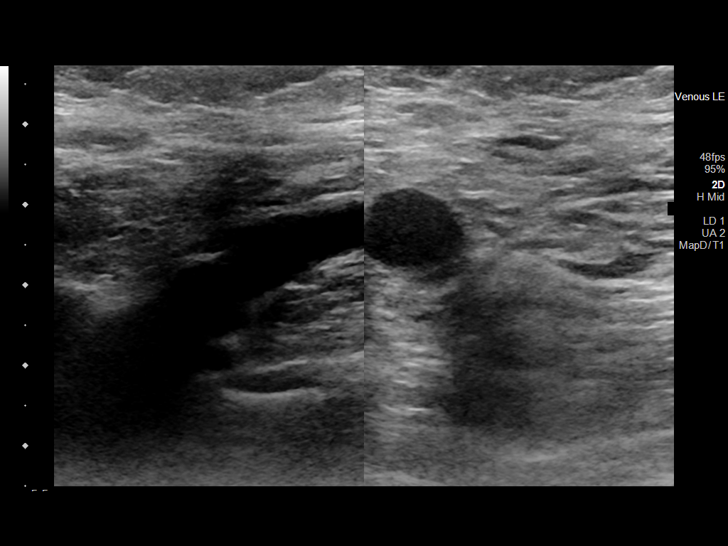
[im 10/37]
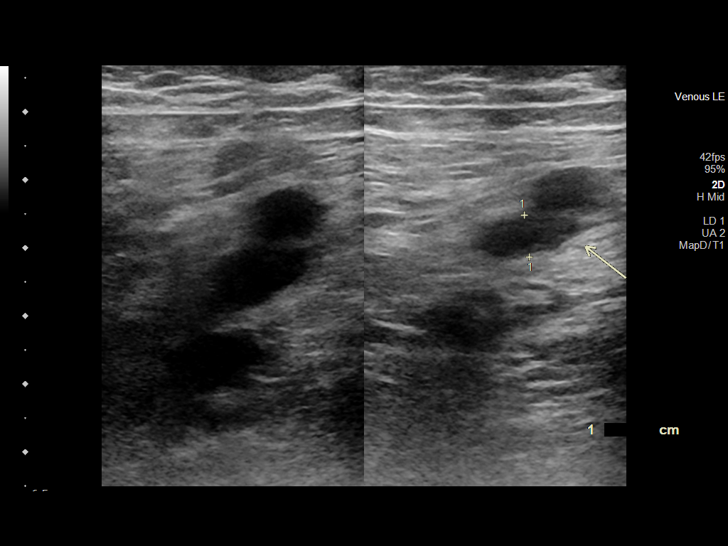
[im 13/37]
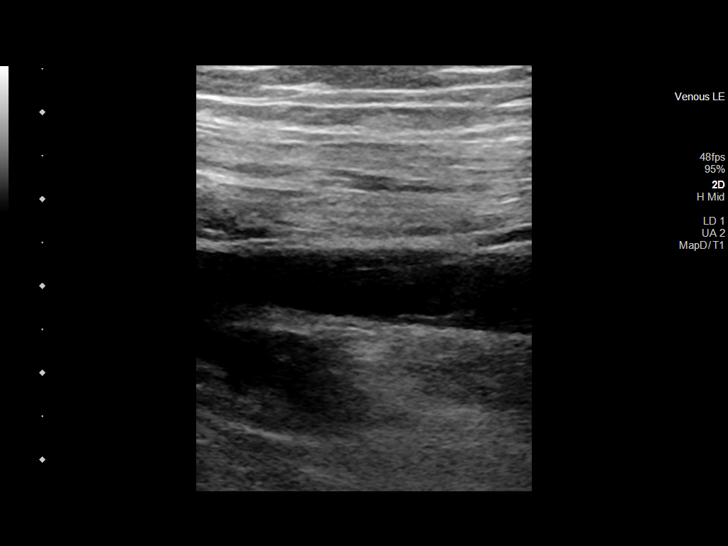
[im 16/37]
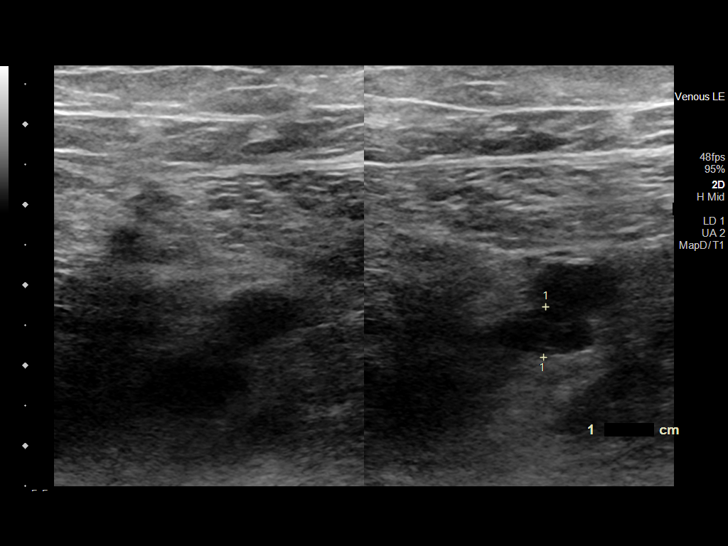
[im 19/37]
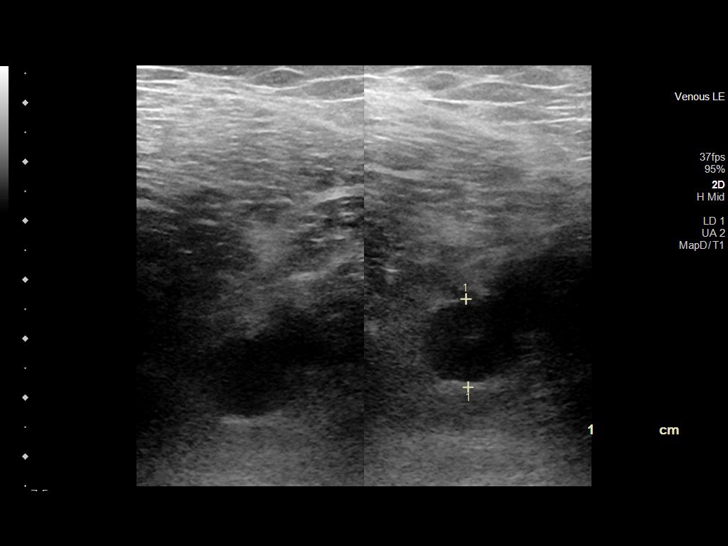
[im 21/37]
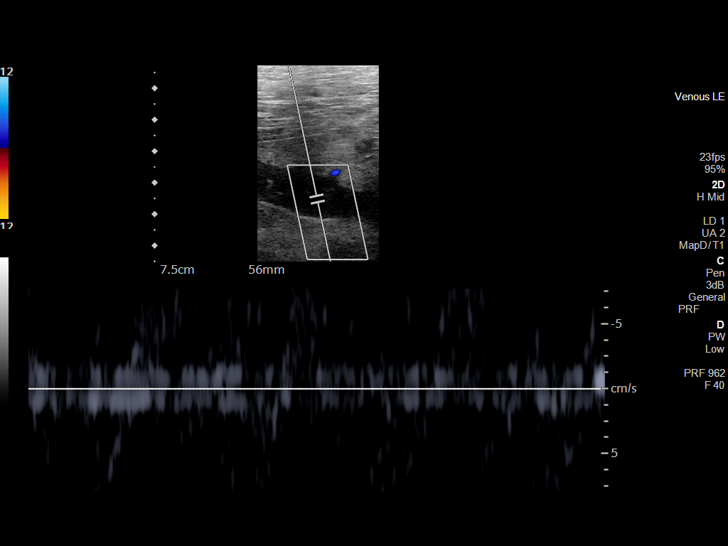
[im 24/37]
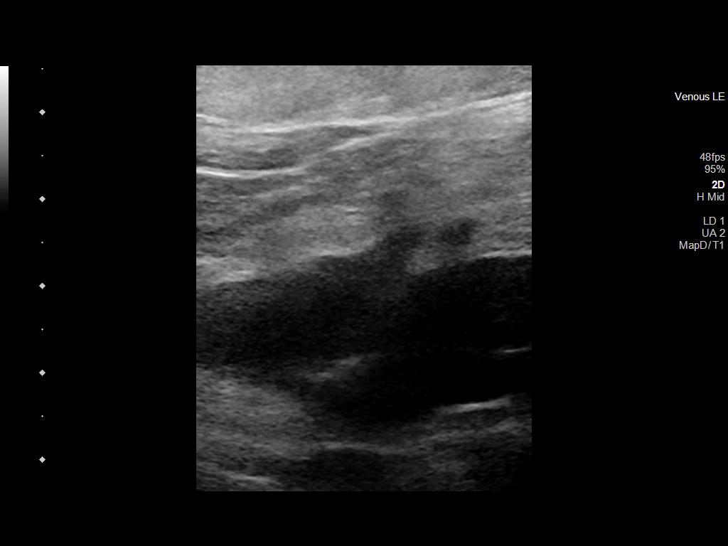
[im 27/37]
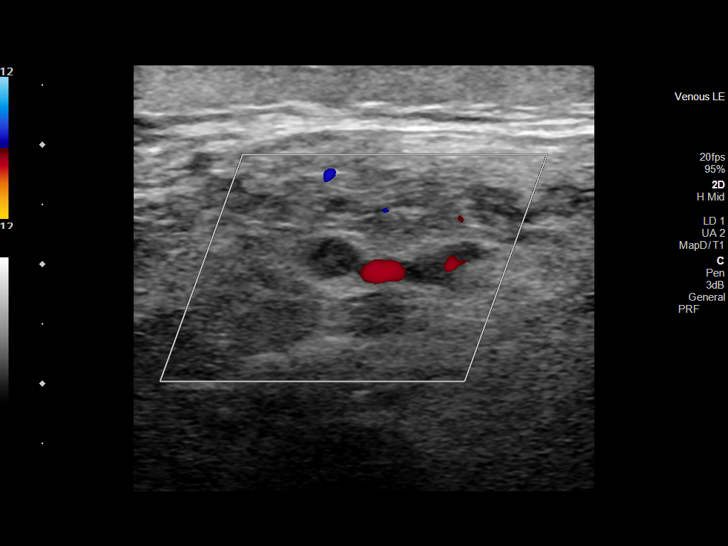
[im 30/37]
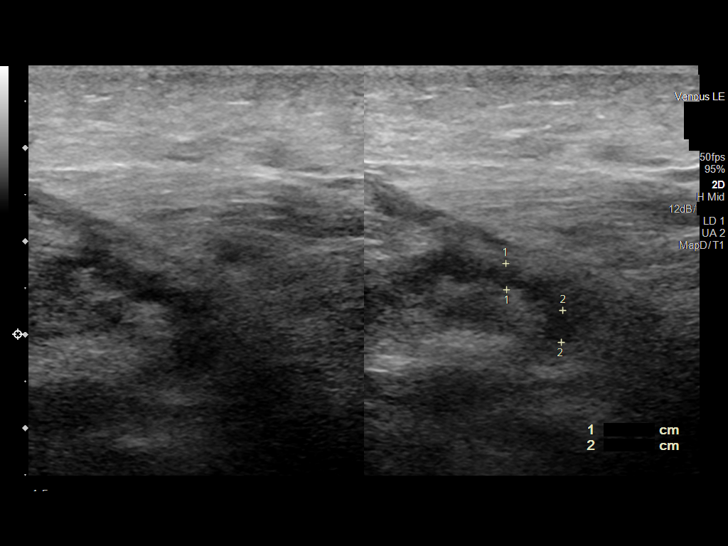
[im 33/37]
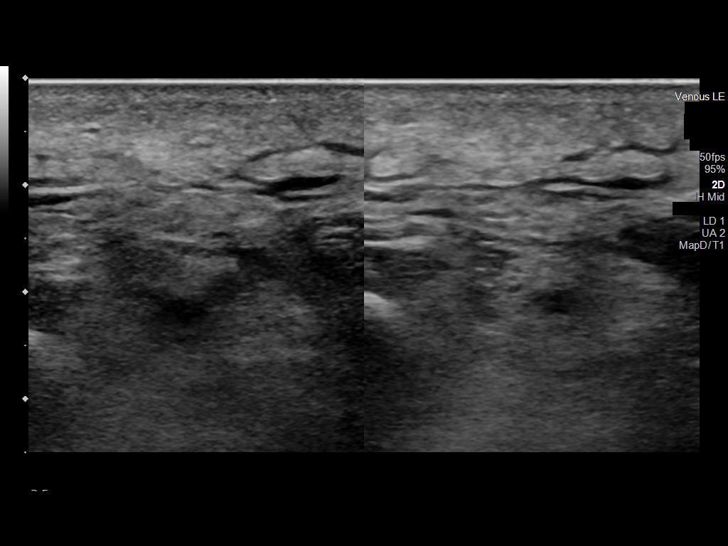
[im 37/37]
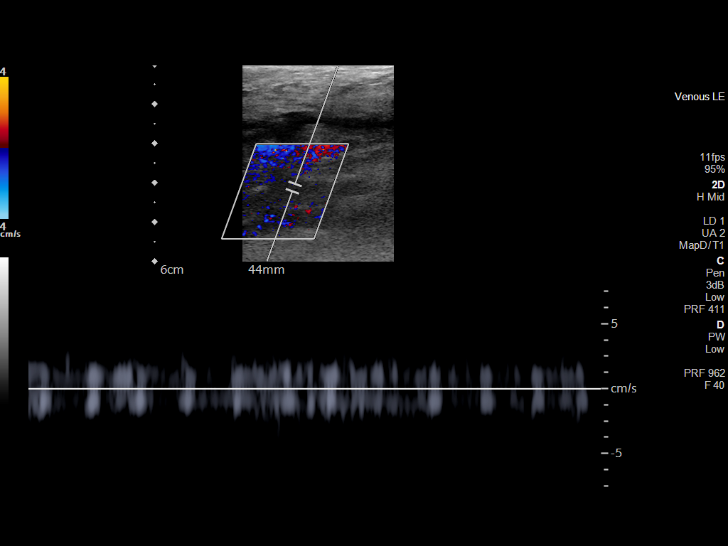

[13 of 24 positions shown; findings below may reference images not displayed]

FINDINGS: Contralateral Common Femoral Vein: Respiratory phasicity is normal
and symmetric with the symptomatic side. No evidence of thrombus.
Normal compressibility.

Common Femoral Vein: No evidence of thrombus. Normal
compressibility, respiratory phasicity and response to augmentation.

Saphenofemoral Junction: No evidence of thrombus. Normal
compressibility and flow on color Doppler imaging.

Profunda Femoral Vein: No evidence of thrombus. Normal
compressibility and flow on color Doppler imaging.

Femoral Vein: Occlusive thrombus noted in the mid and distal
segments. Partially occlusive eccentric thrombus present in the
proximal segment. Incompletely compressible.

Popliteal Vein: Occlusive thrombus.  Incompletely compressible.

Calf Veins: Thrombosed posterior tibial, peroneal, and gastrocnemius
veins are noted. Incompletely compressible.

Superficial Great Saphenous Vein: No evidence of thrombus. Normal
compressibility.

Other Findings:  None.
IMPRESSION: Deep venous thrombosis of the right femoral, popliteal, and calf
veins.

## 2022-06-22 IMAGING — CT CT ANGIO CHEST
2 of 7 series · 17 of 46 positions shown · IV contrast (agent unspecified)
Comparison: None.

CLINICAL DATA: Pulmonary embolus suspected with high probability.
Recent surgery with shortness of breath since discharge.

EXAM:
CT ANGIOGRAPHY CHEST WITH CONTRAST
TECHNIQUE: Multidetector CT imaging of the chest was performed using the
standard protocol during bolus administration of intravenous
contrast. Multiplanar CT image reconstructions and MIPs were
obtained to evaluate the vascular anatomy.

[Series 5: pe axial thins · axial · 0.95mm/px · z∈[+1290,+1597]mm · 14 of 355 slices shown]
[im 24/355  lung]
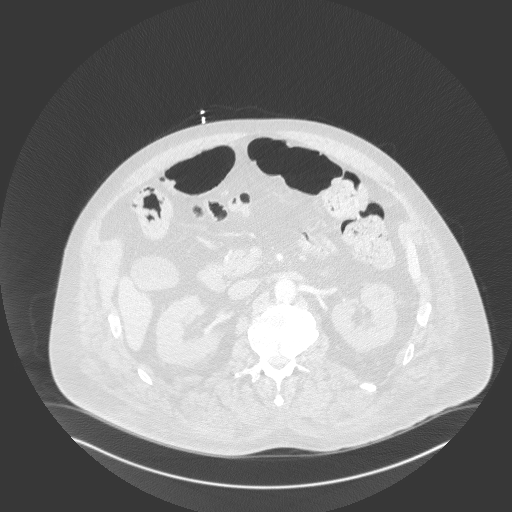
[im 48/355  soft-tissue]
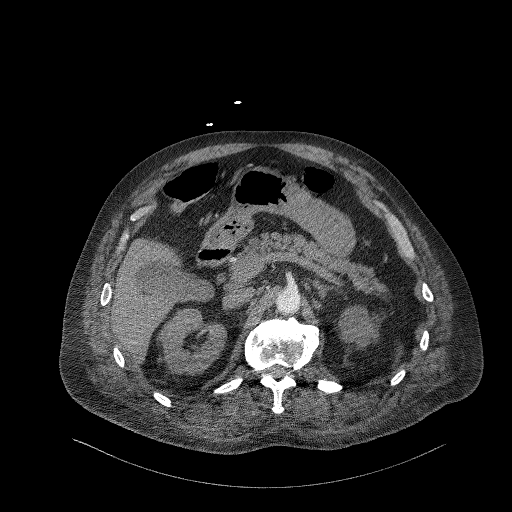
[im 71/355  lung]
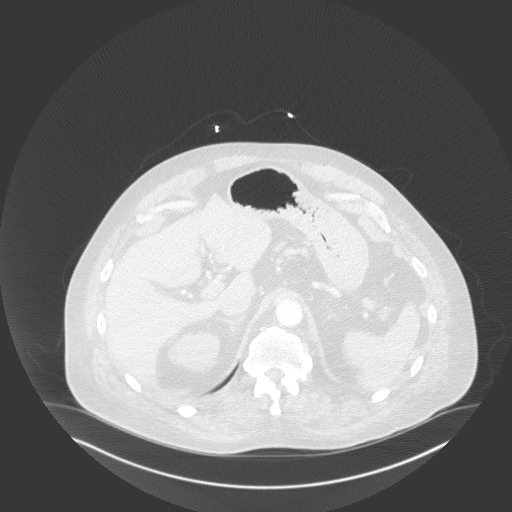
[im 95/355  soft-tissue]
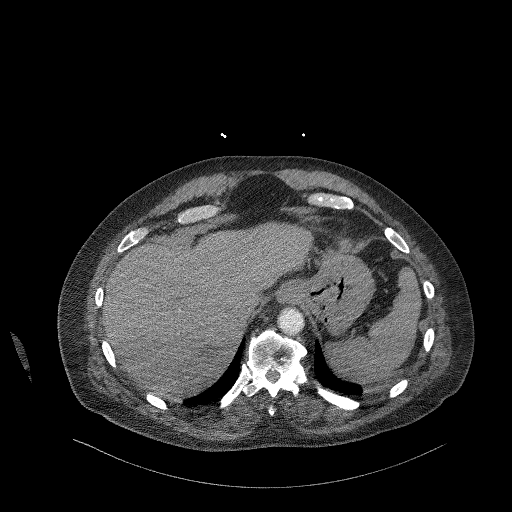
[im 119/355  lung]
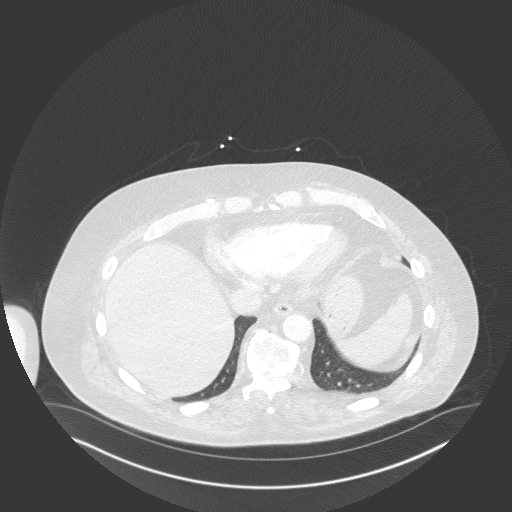
[im 142/355  soft-tissue]
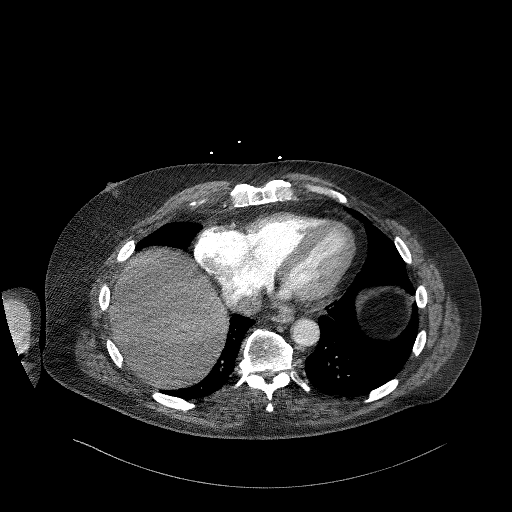
[im 166/355  lung]
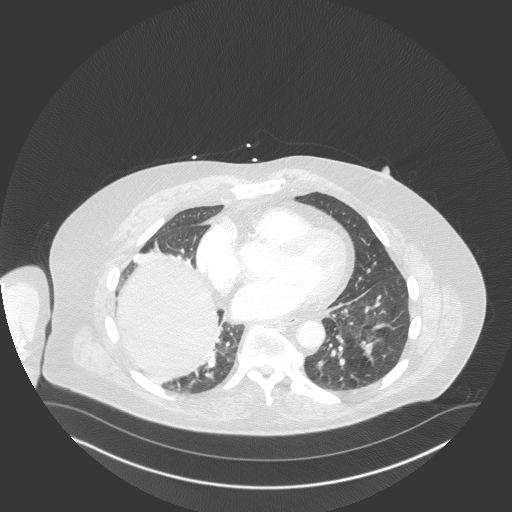
[im 189/355  soft-tissue]
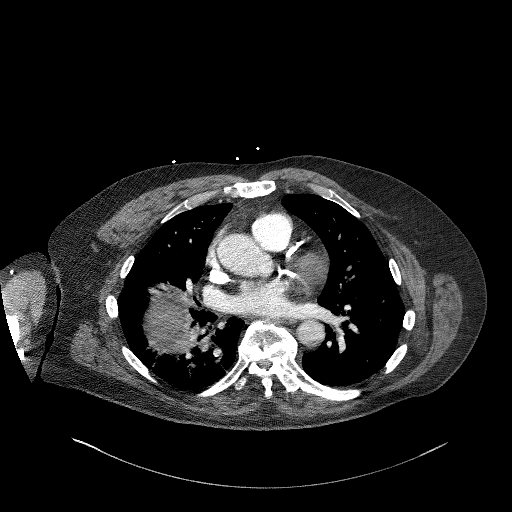
[im 213/355  lung]
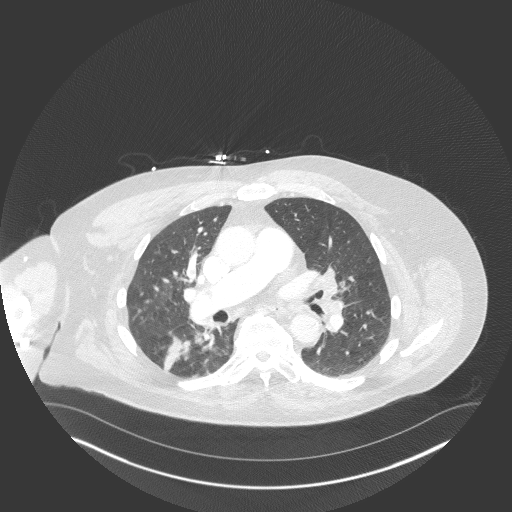
[im 237/355  soft-tissue]
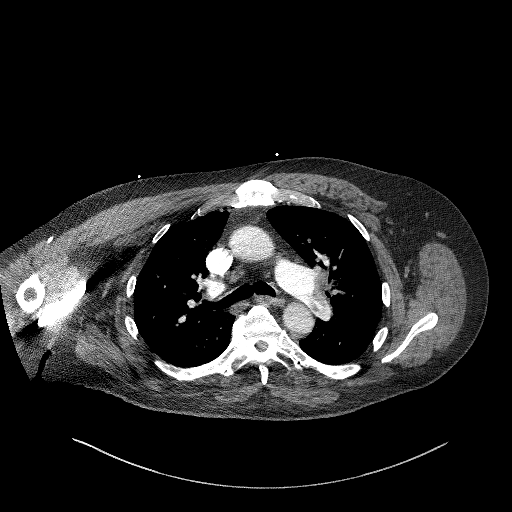
[im 260/355  lung]
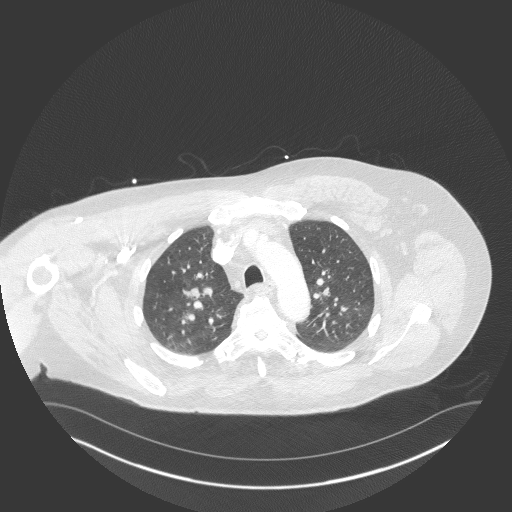
[im 284/355  soft-tissue]
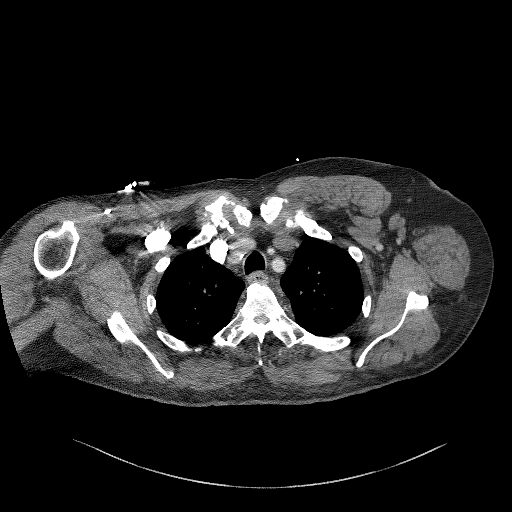
[im 307/355  lung]
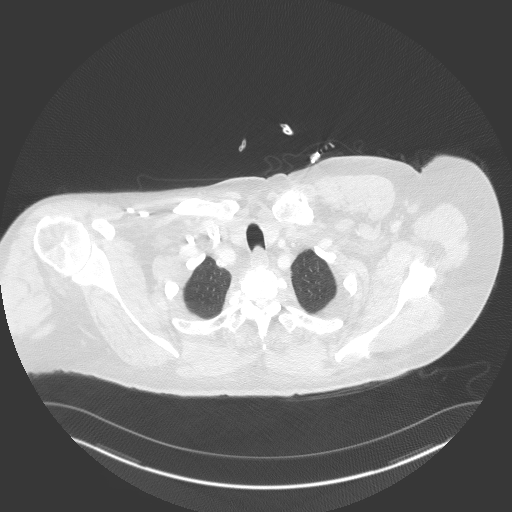
[im 331/355  soft-tissue]
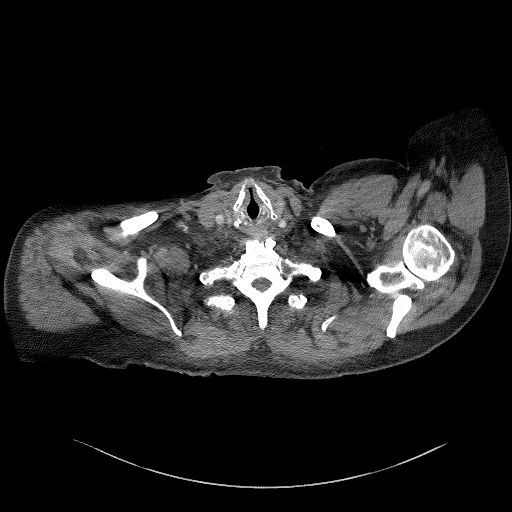

[Series 7: cor soft · coronal · 0.73mm/px · 3 of 180 slices shown]
[im 45/180  soft-tissue]
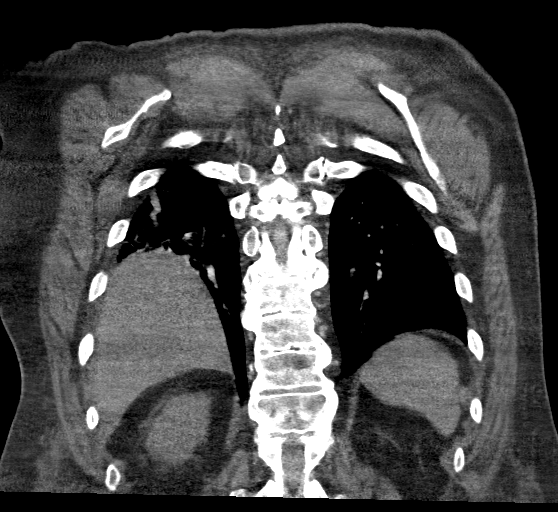
[im 90/180  soft-tissue]
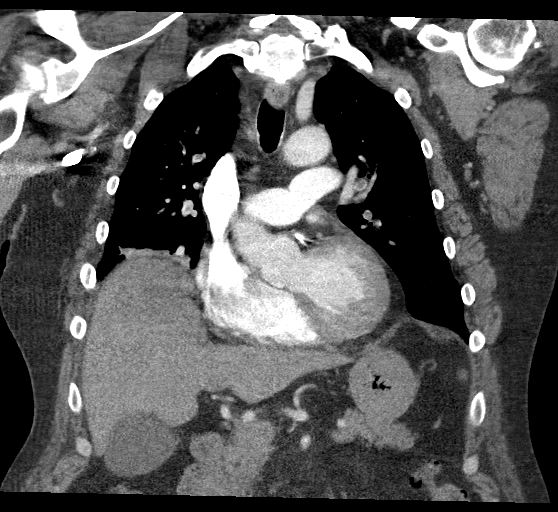
[im 135/180  soft-tissue]
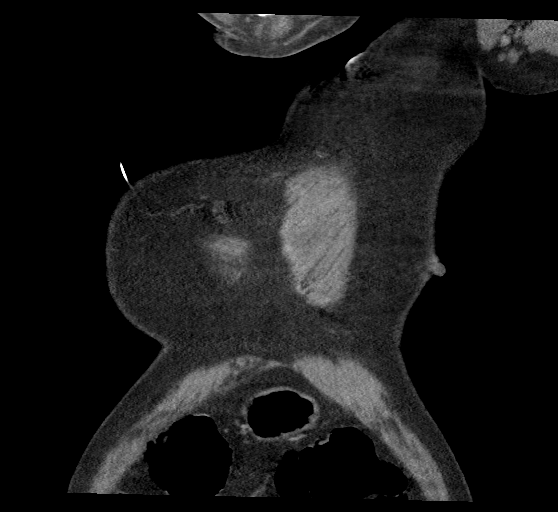

[17 of 46 positions shown; findings below may reference images not displayed]

RADIATION DOSE REDUCTION: This exam was performed according to the
departmental dose-optimization program which includes automated
exposure control, adjustment of the mA and/or kV according to
patient size and/or use of iterative reconstruction technique.

CONTRAST:  76mL OMNIPAQUE IOHEXOL 350 MG/ML SOLN
FINDINGS: Cardiovascular: Good opacification of the central and segmental
pulmonary arteries. Multiple filling defects are demonstrated in the
distal main pulmonary arteries bilaterally and extending into
multiple bilateral upper and lower lobe segmental branches. This
indicates acute pulmonary embolus. Measurement evidence of right
heart strain with RV to LV ratio measuring 1.5. Dilatation of the
right atrium. No pericardial effusions. Normal caliber thoracic
aorta without dissection. Great vessel origins are patent.
Calcification of the aorta and coronary arteries.

Mediastinum/Nodes: No enlarged mediastinal, hilar, or axillary lymph
nodes. Thyroid gland, trachea, and esophagus demonstrate no
significant findings.

Lungs/Pleura: Focal consolidation or atelectasis in the right lower
lung. No pleural effusions.

Upper Abdomen: Layering density in the gallbladder likely represents
sludge. Small stones are demonstrated in both kidneys. No
hydronephrosis.

Musculoskeletal: Degenerative changes.  No destructive bone lesions.

Review of the MIP images confirms the above findings.
IMPRESSION: Positive for acute PE with CT evidence of right heart strain (RV/LV
Ratio = 1.5) consistent with at least submassive (intermediate risk)
PE. The presence of right heart strain has been associated with an
increased risk of morbidity and mortality. Please refer to the "PE
Focused" order set in [REDACTED].

Focal consolidation or atelectasis in the right lower lung.

Probable sludge in the gallbladder. Nonobstructing bilateral renal
stones.

Critical Value/emergent results were called by telephone at the time
of interpretation on 07/26/2021 at [DATE] to provider Dr. Ching Ho,
who verbally acknowledged these results.

## 2022-06-25 DIAGNOSIS — I2699 Other pulmonary embolism without acute cor pulmonale: Secondary | ICD-10-CM | POA: Diagnosis not present

## 2022-06-25 DIAGNOSIS — I82411 Acute embolism and thrombosis of right femoral vein: Secondary | ICD-10-CM | POA: Diagnosis not present

## 2022-06-25 DIAGNOSIS — E119 Type 2 diabetes mellitus without complications: Secondary | ICD-10-CM | POA: Diagnosis not present

## 2022-06-25 DIAGNOSIS — Z4789 Encounter for other orthopedic aftercare: Secondary | ICD-10-CM | POA: Diagnosis not present

## 2022-06-25 DIAGNOSIS — J4489 Other specified chronic obstructive pulmonary disease: Secondary | ICD-10-CM | POA: Diagnosis not present

## 2022-06-25 DIAGNOSIS — Z432 Encounter for attention to ileostomy: Secondary | ICD-10-CM | POA: Diagnosis not present

## 2022-06-26 ENCOUNTER — Emergency Department (HOSPITAL_BASED_OUTPATIENT_CLINIC_OR_DEPARTMENT_OTHER): Payer: Medicare Other

## 2022-06-26 ENCOUNTER — Other Ambulatory Visit: Payer: Self-pay

## 2022-06-26 ENCOUNTER — Encounter (HOSPITAL_BASED_OUTPATIENT_CLINIC_OR_DEPARTMENT_OTHER): Payer: Self-pay

## 2022-06-26 ENCOUNTER — Inpatient Hospital Stay (HOSPITAL_BASED_OUTPATIENT_CLINIC_OR_DEPARTMENT_OTHER)
Admission: EM | Admit: 2022-06-26 | Discharge: 2022-06-30 | DRG: 871 | Disposition: A | Discharge status: Home Health | Payer: Medicare Other | Attending: Student | Admitting: Student

## 2022-06-26 DIAGNOSIS — R1310 Dysphagia, unspecified: Secondary | ICD-10-CM | POA: Diagnosis not present

## 2022-06-26 DIAGNOSIS — R6521 Severe sepsis with septic shock: Secondary | ICD-10-CM | POA: Diagnosis not present

## 2022-06-26 DIAGNOSIS — N3000 Acute cystitis without hematuria: Secondary | ICD-10-CM | POA: Diagnosis not present

## 2022-06-26 DIAGNOSIS — J9811 Atelectasis: Secondary | ICD-10-CM | POA: Diagnosis not present

## 2022-06-26 DIAGNOSIS — D638 Anemia in other chronic diseases classified elsewhere: Secondary | ICD-10-CM | POA: Diagnosis present

## 2022-06-26 DIAGNOSIS — Z86718 Personal history of other venous thrombosis and embolism: Secondary | ICD-10-CM

## 2022-06-26 DIAGNOSIS — A419 Sepsis, unspecified organism: Principal | ICD-10-CM

## 2022-06-26 DIAGNOSIS — Z7901 Long term (current) use of anticoagulants: Secondary | ICD-10-CM

## 2022-06-26 DIAGNOSIS — J4489 Other specified chronic obstructive pulmonary disease: Secondary | ICD-10-CM | POA: Diagnosis not present

## 2022-06-26 DIAGNOSIS — Z9049 Acquired absence of other specified parts of digestive tract: Secondary | ICD-10-CM

## 2022-06-26 DIAGNOSIS — N200 Calculus of kidney: Secondary | ICD-10-CM | POA: Diagnosis not present

## 2022-06-26 DIAGNOSIS — Z87891 Personal history of nicotine dependence: Secondary | ICD-10-CM

## 2022-06-26 DIAGNOSIS — I2699 Other pulmonary embolism without acute cor pulmonale: Secondary | ICD-10-CM | POA: Diagnosis not present

## 2022-06-26 DIAGNOSIS — G4733 Obstructive sleep apnea (adult) (pediatric): Secondary | ICD-10-CM | POA: Diagnosis not present

## 2022-06-26 DIAGNOSIS — I10 Essential (primary) hypertension: Secondary | ICD-10-CM | POA: Diagnosis present

## 2022-06-26 DIAGNOSIS — I480 Paroxysmal atrial fibrillation: Secondary | ICD-10-CM | POA: Diagnosis present

## 2022-06-26 DIAGNOSIS — R112 Nausea with vomiting, unspecified: Secondary | ICD-10-CM | POA: Insufficient documentation

## 2022-06-26 DIAGNOSIS — Z880 Allergy status to penicillin: Secondary | ICD-10-CM

## 2022-06-26 DIAGNOSIS — I82411 Acute embolism and thrombosis of right femoral vein: Secondary | ICD-10-CM | POA: Diagnosis not present

## 2022-06-26 DIAGNOSIS — Z823 Family history of stroke: Secondary | ICD-10-CM

## 2022-06-26 DIAGNOSIS — Z8249 Family history of ischemic heart disease and other diseases of the circulatory system: Secondary | ICD-10-CM

## 2022-06-26 DIAGNOSIS — Z96643 Presence of artificial hip joint, bilateral: Secondary | ICD-10-CM | POA: Diagnosis present

## 2022-06-26 DIAGNOSIS — I2510 Atherosclerotic heart disease of native coronary artery without angina pectoris: Secondary | ICD-10-CM | POA: Diagnosis present

## 2022-06-26 DIAGNOSIS — K219 Gastro-esophageal reflux disease without esophagitis: Secondary | ICD-10-CM | POA: Diagnosis not present

## 2022-06-26 DIAGNOSIS — M1990 Unspecified osteoarthritis, unspecified site: Secondary | ICD-10-CM | POA: Diagnosis present

## 2022-06-26 DIAGNOSIS — C61 Malignant neoplasm of prostate: Secondary | ICD-10-CM | POA: Diagnosis present

## 2022-06-26 DIAGNOSIS — J449 Chronic obstructive pulmonary disease, unspecified: Secondary | ICD-10-CM | POA: Diagnosis not present

## 2022-06-26 DIAGNOSIS — Z432 Encounter for attention to ileostomy: Secondary | ICD-10-CM | POA: Diagnosis not present

## 2022-06-26 DIAGNOSIS — E871 Hypo-osmolality and hyponatremia: Secondary | ICD-10-CM | POA: Diagnosis present

## 2022-06-26 DIAGNOSIS — Z888 Allergy status to other drugs, medicaments and biological substances status: Secondary | ICD-10-CM

## 2022-06-26 DIAGNOSIS — M5416 Radiculopathy, lumbar region: Secondary | ICD-10-CM | POA: Diagnosis not present

## 2022-06-26 DIAGNOSIS — G8929 Other chronic pain: Secondary | ICD-10-CM | POA: Diagnosis present

## 2022-06-26 DIAGNOSIS — Z79899 Other long term (current) drug therapy: Secondary | ICD-10-CM

## 2022-06-26 DIAGNOSIS — R6520 Severe sepsis without septic shock: Secondary | ICD-10-CM | POA: Diagnosis not present

## 2022-06-26 DIAGNOSIS — Z91030 Bee allergy status: Secondary | ICD-10-CM

## 2022-06-26 DIAGNOSIS — Z4789 Encounter for other orthopedic aftercare: Secondary | ICD-10-CM | POA: Diagnosis not present

## 2022-06-26 DIAGNOSIS — N179 Acute kidney failure, unspecified: Secondary | ICD-10-CM | POA: Diagnosis present

## 2022-06-26 DIAGNOSIS — Z91013 Allergy to seafood: Secondary | ICD-10-CM

## 2022-06-26 DIAGNOSIS — R7881 Bacteremia: Secondary | ICD-10-CM | POA: Diagnosis not present

## 2022-06-26 DIAGNOSIS — Z79890 Hormone replacement therapy (postmenopausal): Secondary | ICD-10-CM

## 2022-06-26 DIAGNOSIS — R198 Other specified symptoms and signs involving the digestive system and abdomen: Secondary | ICD-10-CM | POA: Diagnosis not present

## 2022-06-26 DIAGNOSIS — R7303 Prediabetes: Secondary | ICD-10-CM | POA: Diagnosis present

## 2022-06-26 DIAGNOSIS — E785 Hyperlipidemia, unspecified: Secondary | ICD-10-CM | POA: Diagnosis not present

## 2022-06-26 DIAGNOSIS — Z1152 Encounter for screening for COVID-19: Secondary | ICD-10-CM | POA: Diagnosis not present

## 2022-06-26 DIAGNOSIS — M546 Pain in thoracic spine: Secondary | ICD-10-CM | POA: Diagnosis not present

## 2022-06-26 DIAGNOSIS — E039 Hypothyroidism, unspecified: Secondary | ICD-10-CM | POA: Diagnosis present

## 2022-06-26 DIAGNOSIS — R0602 Shortness of breath: Secondary | ICD-10-CM | POA: Diagnosis not present

## 2022-06-26 DIAGNOSIS — Z932 Ileostomy status: Secondary | ICD-10-CM

## 2022-06-26 DIAGNOSIS — Z86711 Personal history of pulmonary embolism: Secondary | ICD-10-CM

## 2022-06-26 DIAGNOSIS — A4150 Gram-negative sepsis, unspecified: Secondary | ICD-10-CM | POA: Diagnosis not present

## 2022-06-26 DIAGNOSIS — E119 Type 2 diabetes mellitus without complications: Secondary | ICD-10-CM | POA: Diagnosis not present

## 2022-06-26 DIAGNOSIS — M545 Low back pain, unspecified: Secondary | ICD-10-CM | POA: Diagnosis not present

## 2022-06-26 DIAGNOSIS — Z833 Family history of diabetes mellitus: Secondary | ICD-10-CM

## 2022-06-26 LAB — COMPREHENSIVE METABOLIC PANEL
ALT: 16 U/L (ref 0–44)
AST: 20 U/L (ref 15–41)
Albumin: 3.8 g/dL (ref 3.5–5.0)
Alkaline Phosphatase: 77 U/L (ref 38–126)
Anion gap: 19 — ABNORMAL HIGH (ref 5–15)
BUN: 25 mg/dL — ABNORMAL HIGH (ref 8–23)
CO2: 19 mmol/L — ABNORMAL LOW (ref 22–32)
Calcium: 9.5 mg/dL (ref 8.9–10.3)
Chloride: 95 mmol/L — ABNORMAL LOW (ref 98–111)
Creatinine, Ser: 2.81 mg/dL — ABNORMAL HIGH (ref 0.61–1.24)
GFR, Estimated: 24 mL/min — ABNORMAL LOW (ref >60)
Glucose, Bld: 131 mg/dL — ABNORMAL HIGH (ref 70–99)
Potassium: 3.7 mmol/L (ref 3.5–5.1)
Sodium: 133 mmol/L — ABNORMAL LOW (ref 135–145)
Total Bilirubin: 2.6 mg/dL — ABNORMAL HIGH (ref 0.3–1.2)
Total Protein: 7.8 g/dL (ref 6.5–8.1)

## 2022-06-26 LAB — CBC WITH DIFFERENTIAL/PLATELET
Abs Immature Granulocytes: 0.6 10*3/uL — ABNORMAL HIGH (ref 0.00–0.07)
Band Neutrophils: 8 %
Basophils Absolute: 0 10*3/uL (ref 0.0–0.1)
Basophils Relative: 0 %
Eosinophils Absolute: 0 10*3/uL (ref 0.0–0.5)
Eosinophils Relative: 0 %
HCT: 38.9 % — ABNORMAL LOW (ref 39.0–52.0)
Hemoglobin: 12.1 g/dL — ABNORMAL LOW (ref 13.0–17.0)
Lymphocytes Relative: 3 %
Lymphs Abs: 0.9 10*3/uL (ref 0.7–4.0)
MCH: 26.1 pg (ref 26.0–34.0)
MCHC: 31.1 g/dL (ref 30.0–36.0)
MCV: 83.8 fL (ref 80.0–100.0)
Metamyelocytes Relative: 2 %
Monocytes Absolute: 1.3 10*3/uL — ABNORMAL HIGH (ref 0.1–1.0)
Monocytes Relative: 4 %
Neutro Abs: 28.5 10*3/uL — ABNORMAL HIGH (ref 1.7–7.7)
Neutrophils Relative %: 83 %
Platelets: 223 10*3/uL (ref 150–400)
RBC: 4.64 MIL/uL (ref 4.22–5.81)
RDW: 17.6 % — ABNORMAL HIGH (ref 11.5–15.5)
WBC: 31.3 10*3/uL — ABNORMAL HIGH (ref 4.0–10.5)
nRBC: 0 % (ref 0.0–0.2)

## 2022-06-26 LAB — URINALYSIS, W/ REFLEX TO CULTURE (INFECTION SUSPECTED)
Bilirubin Urine: NEGATIVE
Glucose, UA: NEGATIVE mg/dL
Ketones, ur: NEGATIVE mg/dL
Nitrite: NEGATIVE
Protein, ur: 100 mg/dL — AB
Specific Gravity, Urine: 1.007 (ref 1.005–1.030)
pH: 6 (ref 5.0–8.0)

## 2022-06-26 LAB — PROTIME-INR
INR: 2.6 — ABNORMAL HIGH (ref 0.8–1.2)
Prothrombin Time: 27.5 seconds — ABNORMAL HIGH (ref 11.4–15.2)

## 2022-06-26 LAB — TROPONIN I (HIGH SENSITIVITY)
Troponin I (High Sensitivity): 12 ng/L (ref <18)
Troponin I (High Sensitivity): 8 ng/L (ref <18)

## 2022-06-26 LAB — APTT: aPTT: 49 seconds — ABNORMAL HIGH (ref 24–36)

## 2022-06-26 LAB — RESP PANEL BY RT-PCR (RSV, FLU A&B, COVID)  RVPGX2
Influenza A by PCR: NEGATIVE
Influenza B by PCR: NEGATIVE
Resp Syncytial Virus by PCR: NEGATIVE
SARS Coronavirus 2 by RT PCR: NEGATIVE

## 2022-06-26 LAB — LACTIC ACID, PLASMA
Lactic Acid, Venous: 7.6 mmol/L (ref 0.5–1.9)
Lactic Acid, Venous: 4.6 mmol/L (ref 0.5–1.9)

## 2022-06-26 MED ORDER — DOCUSATE SODIUM 100 MG PO CAPS: 100.0000 mg | ORAL_CAPSULE | Freq: Two times a day (BID) | ORAL | Status: DC | PRN

## 2022-06-26 MED ORDER — HEPARIN SODIUM (PORCINE) 5000 UNIT/ML IJ SOLN: 5000.0000 [IU] | Freq: Three times a day (TID) | INTRAMUSCULAR | Status: DC

## 2022-06-26 MED ORDER — ONDANSETRON HCL 4 MG/2ML IJ SOLN: 4.0000 mg | Freq: Four times a day (QID) | INTRAMUSCULAR | Status: DC | PRN

## 2022-06-26 MED ORDER — VANCOMYCIN HCL IN DEXTROSE 1-5 GM/200ML-% IV SOLN: 1000.0000 mg | Freq: Once | INTRAVENOUS | Status: DC

## 2022-06-26 MED ORDER — LEVOTHYROXINE SODIUM 50 MCG PO TABS
50.0000 ug | ORAL_TABLET | Freq: Every day | ORAL | Status: DC
  Administered 2022-06-27 – 2022-06-30 (×4): 50 ug via ORAL
  Filled 2022-06-26 (×4): qty 1

## 2022-06-26 MED ORDER — POLYETHYLENE GLYCOL 3350 17 G PO PACK: 17.0000 g | PACK | Freq: Every day | ORAL | Status: DC | PRN

## 2022-06-26 MED ORDER — ACETAMINOPHEN 650 MG RE SUPP: 650.0000 mg | Freq: Four times a day (QID) | RECTAL | Status: DC | PRN

## 2022-06-26 MED ORDER — ACETAMINOPHEN 325 MG PO TABS
650.0000 mg | ORAL_TABLET | Freq: Four times a day (QID) | ORAL | Status: DC | PRN
  Administered 2022-06-27 – 2022-06-29 (×3): 650 mg via ORAL
  Filled 2022-06-26 (×3): qty 2

## 2022-06-26 MED ORDER — VANCOMYCIN VARIABLE DOSE PER UNSTABLE RENAL FUNCTION (PHARMACIST DOSING)
Status: DC
  Filled 2022-06-26: qty 1

## 2022-06-26 MED ORDER — METRONIDAZOLE 500 MG/100ML IV SOLN
500.0000 mg | Freq: Once | INTRAVENOUS | Status: AC
  Administered 2022-06-26: 500 mg via INTRAVENOUS
  Filled 2022-06-26: qty 100

## 2022-06-26 MED ORDER — LACTATED RINGERS IV SOLN: INTRAVENOUS | Status: DC

## 2022-06-26 MED ORDER — LACTATED RINGERS IV BOLUS (SEPSIS)
1000.0000 mL | Freq: Once | INTRAVENOUS | Status: AC
  Administered 2022-06-26: 1000 mL via INTRAVENOUS

## 2022-06-26 MED ORDER — SODIUM CHLORIDE 0.9 % IV SOLN: 250.0000 mL | INTRAVENOUS | Status: DC

## 2022-06-26 MED ORDER — SODIUM CHLORIDE 0.9 % IV SOLN
2.0000 g | Freq: Once | INTRAVENOUS | Status: AC
  Administered 2022-06-26: 2 g via INTRAVENOUS
  Filled 2022-06-26: qty 12.5

## 2022-06-26 MED ORDER — VANCOMYCIN HCL 2000 MG/400ML IV SOLN
2000.0000 mg | Freq: Once | INTRAVENOUS | Status: DC
  Filled 2022-06-26: qty 400

## 2022-06-26 MED ORDER — VANCOMYCIN HCL IN DEXTROSE 1-5 GM/200ML-% IV SOLN
1000.0000 mg | Freq: Once | INTRAVENOUS | Status: AC
  Administered 2022-06-26: 1000 mg via INTRAVENOUS
  Filled 2022-06-26: qty 200

## 2022-06-26 MED ORDER — ORAL CARE MOUTH RINSE: 15.0000 mL | OROMUCOSAL | Status: DC | PRN

## 2022-06-26 MED ORDER — SODIUM CHLORIDE 0.9 % IV SOLN: 25.0000 ug/min | INTRAVENOUS | Status: DC

## 2022-06-26 MED ORDER — LACTATED RINGERS IV BOLUS
1000.0000 mL | Freq: Once | INTRAVENOUS | Status: AC
  Administered 2022-06-26: 1000 mL via INTRAVENOUS

## 2022-06-26 MED ORDER — SODIUM CHLORIDE 0.9 % IV SOLN: 2.0000 g | INTRAVENOUS | Status: DC

## 2022-06-26 MED ORDER — ACETAMINOPHEN 500 MG PO TABS
1000.0000 mg | ORAL_TABLET | Freq: Once | ORAL | Status: AC
  Administered 2022-06-26: 1000 mg via ORAL
  Filled 2022-06-26: qty 2

## 2022-06-26 MED ORDER — NOREPINEPHRINE 4 MG/250ML-% IV SOLN
2.0000 ug/min | INTRAVENOUS | Status: DC
  Administered 2022-06-26: 2 ug/min via INTRAVENOUS
  Filled 2022-06-26: qty 250

## 2022-06-26 MED ORDER — CHLORHEXIDINE GLUCONATE CLOTH 2 % EX PADS
6.0000 | MEDICATED_PAD | Freq: Every day | CUTANEOUS | Status: DC
  Administered 2022-06-27 – 2022-06-28 (×2): 6 via TOPICAL

## 2022-06-26 MED ORDER — ALBUTEROL SULFATE (2.5 MG/3ML) 0.083% IN NEBU: 2.5000 mg | INHALATION_SOLUTION | RESPIRATORY_TRACT | Status: DC | PRN

## 2022-06-26 NOTE — ED Notes (Signed)
Pt's pulse ox picking up O2 saturation at 75%. RN placed pt on a 2L [place] and swapped out O2 cable and pulse ox to determine accurate reading. Pt's O2 sat currently 98% on 1L.

## 2022-06-26 NOTE — Progress Notes (Signed)
Pharmacy Antibiotic Note  Kyle Delacruz is a 67 y.o. male admitted on 06/26/2022 presenting with SOB and fatigue, concern for sepsis.  Pharmacy has been consulted for cefepime and vancomycin dosing.  Plan: Vancomycin 2g IV x 1, then variable dosing d/t unstable renal function Cefepime 2g IV every 24 hours Monitor renal function, Cx and clinical progression to narrow Vancomycin levels as indicated  Height: 5' 10"$  (177.8 cm) Weight: 92.1 kg (203 lb 0.7 oz) IBW/kg (Calculated) : 73  Temp (24hrs), Avg:99.4 F (37.4 C), Min:98.9 F (37.2 C), Max:99.9 F (37.7 C)  Recent Labs  Lab 06/26/22 1517  WBC 31.3*  CREATININE 2.81*  LATICACIDVEN 7.6*    Estimated Creatinine Clearance: 29.9 mL/min (A) (by C-G formula based on SCr of 2.81 mg/dL (H)).    Allergies  Allergen Reactions   Bee Venom     Other reaction(s): Unknown   Lisinopril     Other reaction(s): Unknown   Metoclopramide Other (See Comments)    Other reaction(s): shaking too bad "Sweat like crazy"    Penicillins Hives    Did it involve swelling of the face/tongue/throat, SOB, or low BP? Yes Did it involve sudden or severe rash/hives, skin peeling, or any reaction on the inside of your mouth or nose? Yes Did you need to seek medical attention at a hospital or doctor's office? No When did it last happen?  childhood      If all above answers are "NO", may proceed with cephalosporin use.     Shellfish Allergy Swelling    Bertis Ruddy, PharmD, [facility] Clinical Pharmacist ED Pharmacist Phone # [phone] 06/26/2022 5:03 PM

## 2022-06-26 NOTE — ED Notes (Signed)
Pt aware of the need for a urine.... Unable to currently provide.Marland KitchenMarland Kitchen

## 2022-06-26 NOTE — ED Notes (Signed)
SpO2 84-89% on monitor with good pleth. RT in to assess. Patient in no distress. Literflow increased to 3L/min at this time. SpO2 97%.

## 2022-06-26 NOTE — ED Triage Notes (Signed)
Patient here POV from Home.  Endorses Back Pain, SOB, and Upset Stomach that began Sunday. Worsened since they began.   NAD Noted during Triage. A&Ox4. GCS 15. BIB Wheelchair.

## 2022-06-26 NOTE — H&P (Signed)
NAME:  Kyle Delacruz, MRN:  [id number], DOB:  1956/01/07, LOS: 0 ADMISSION DATE:  06/26/2022, CONSULTATION DATE:  06/26/22 REFERRING MD:  EDP, CHIEF COMPLAINT:  shock, suspected sepsis   History of Present Illness:  67 yo male with pmh listed below presented to [facility] with back pain, sob, and nausea that began Sunday. He endorses that this has worsened since then. When he presented to the outside facility pt had sats 84-89% on RA, he was placed on 3L [place] with improved sats to high 90's. He was additionally noted to have hyponatremia 133, aki with Cr 2.8 (baseline 0.8), lactate 7.6 with improvement to 4.6 after volume. Wbc 31 with left shift and 8% bandemia. He was recently seen with enterococcus uti and ct abd/pelvis revealed potential cystitis vs gastritis.   Cx obtained, empiric abx and transferred for monitoring with pressor use.   Pt arrived to our ICU on no vasopressors or supplemental oxygen, mentating without complaints other than chronic back pain. Sob and fever/chills have improved per pt. No cp, syncope, vomiting/diarrhea, ha.   Pt is likely stable to transfer from ICU.  Pertinent  Medical History  PAF CAD DM2 Hyperlipidemia GERD Hypothyroidism Prostate Ca H/o PE 03/23  Significant Hospital Events: Including procedures, antibiotic start and stop dates in addition to other pertinent events   Admitted to [facility] 2/13 for septic shock  Interim History / Subjective:  As above  Objective   Blood pressure 134/66, pulse (!) 117, temperature (!) 103.2 F (39.6 C), temperature source Oral, resp. rate (!) 22, height 5' 10"$  (1.778 m), weight 92.1 kg, SpO2 99 %.        Intake/Output Summary (Last 24 hours) at 06/26/2022 2059 Last data filed at 06/26/2022 1901 Gross per 24 hour  Intake 4400.18 ml  Output --  Net 4400.18 ml   Filed Weights   06/26/22 1459  Weight: 92.1 kg    Examination: General: nad, sitting up in bed HENT: ncat eomi perrla Lungs:  ctab Cardiovascular: rrr Abdomen: mildly ttp, no rebound/guarding bs + does have ileostomy in place Extremities: no c/c + edema in b/l le Neuro: aaox4 moves all extremities, no focal deficits GU: deferred.   Resolved Hospital Problem list     Assessment & Plan:  Shock, presumed septic:  -undetermined source at this time -titrate vasopressors to map >65 -pancx -empiric abx  Aki:  -monitor I/o -no hydro on ct -place foley in light of ? Cystitis with ? Obstruction considering size of prostate -follow cx -follow indices  Hyponatremia:  -suspect this will improve with volume as has lactic acidosis -recheck in am.  Lactic acidosis: -improving with volume  Nausea:  -supportive care -npo until improved -cont ivf  Prediabetes:  -follow glucose prn -last A1c 5.9 in 04/12/22  H/o pe/dvt: On eliquis at baseline  Hypothyroidism:  -cont levothyroxine    Best Practice (right click and "Reselect all SmartList Selections" daily)   Diet/type: NPO w/ oral meds DVT prophylaxis: LMWH GI prophylaxis: N/A Lines: N/A Foley:  N/A Code Status:  full code Last date of multidisciplinary goals of care discussion [with pt 2/13]  Labs   CBC: Recent Labs  Lab 06/26/22 1517  WBC 31.3*  NEUTROABS 28.5*  HGB 12.1*  HCT 38.9*  MCV 83.8  PLT [id number]    Basic Metabolic Panel: Recent Labs  Lab 06/26/22 1517  NA 133*  K 3.7  CL 95*  CO2 19*  GLUCOSE 131*  BUN 25*  CREATININE 2.81*  CALCIUM 9.5  GFR: Estimated Creatinine Clearance: 29.9 mL/min (A) (by C-G formula based on SCr of 2.81 mg/dL (H)). Recent Labs  Lab 06/26/22 1517 06/26/22 1717  WBC 31.3*  --   LATICACIDVEN 7.6* 4.6*    Liver Function Tests: Recent Labs  Lab 06/26/22 1517  AST 20  ALT 16  ALKPHOS 77  BILITOT 2.6*  PROT 7.8  ALBUMIN 3.8   No results for input(s): "LIPASE", "AMYLASE" in the last 168 hours. No results for input(s): "AMMONIA" in the last 168 hours.  ABG    Component Value  Date/Time   PHART 7.47 (H) 04/11/2022 1026   PCO2ART 38 04/11/2022 1026   PO2ART 115 (H) 04/11/2022 1026   HCO3 27.7 04/11/2022 1026   TCO2 22 04/10/2022 2213   ACIDBASEDEF 4.0 (H) 04/10/2022 2213   O2SAT 99.5 04/11/2022 1026     Coagulation Profile: Recent Labs  Lab 06/26/22 1517  INR 2.6*    Cardiac Enzymes: No results for input(s): "CKTOTAL", "CKMB", "CKMBINDEX", "TROPONINI" in the last 168 hours.  HbA1C: Hgb A1c MFr Bld  Date/Time Value Ref Range Status  04/12/2022 02:40 AM 5.9 (H) 4.8 - 5.6 % Final    Comment:    (NOTE)         Prediabetes: 5.7 - 6.4         Diabetes: >6.4         Glycemic control for adults with diabetes: <7.0     CBG: No results for input(s): "GLUCAP" in the last 168 hours.  Review of Systems:   As per HPI  Past Medical History:  He,  has a past medical history of Allergy, Arthritis, CTS (carpal tunnel syndrome), DJD (degenerative joint disease), DM2 (diabetes mellitus, type 2) ([place]), DVT (deep venous thrombosis) ([place]), GERD (gastroesophageal reflux disease), Gout, History of adenomatous polyps of colon (12/29/2019), History of nuclear stress test, Hyperlipidemia, Hypothyroidism, Nicotine addiction, OSA (obstructive sleep apnea), Prostatitis, Reactive airway disease, Sarcocystosis, Sinus arrhythmia, Sleep apnea, and Tinea corporis.   Surgical History:   Past Surgical History:  Procedure Laterality Date   BILATERAL KNEE ARTHROSCOPY     right- 1997; left 2001   COLONOSCOPY  multiple last 2013   KNEE SURGERY Right 2014   torn R medial meniscus- arthroscopy done   LAPAROTOMY N/A 04/10/2022   Procedure: EXPLORATORY LAPAROTOMY, TOTAL COLECTOMY, END ILEOSTOMY;  Surgeon: Felicie Morn, MD;  Location: [facility] ORS;  Service: General;  Laterality: N/A;   PARATHYROIDECTOMY  2005   ROTATOR CUFF REPAIR  2004   right   TONSILLECTOMY     TOTAL HIP ARTHROPLASTY Left 2009   TOTAL HIP ARTHROPLASTY Right 2006   UPPER GASTROINTESTINAL ENDOSCOPY        Social History:   reports that he has quit smoking. His smoking use included cigarettes. He smoked an average of .5 packs per day. He has never used smokeless tobacco. He reports that he does not currently use alcohol after a past usage of about 21.0 standard drinks of alcohol per week. He reports that he does not use drugs.   Family History:  His family history includes Diabetes in his father and paternal grandmother; Heart disease in his father; Stroke in his mother. There is no history of Colon cancer, Stomach cancer, Esophageal cancer, Rectal cancer, or Prostate cancer.   Allergies Allergies  Allergen Reactions   Bee Venom     Other reaction(s): Unknown   Lisinopril     Other reaction(s): Unknown   Metoclopramide Other (See Comments)    Other  reaction(s): shaking too bad "Sweat like crazy"    Penicillins Hives    Did it involve swelling of the face/tongue/throat, SOB, or low BP? Yes Did it involve sudden or severe rash/hives, skin peeling, or any reaction on the inside of your mouth or nose? Yes Did you need to seek medical attention at a hospital or doctor's office? No When did it last happen?  childhood      If all above answers are "NO", may proceed with cephalosporin use.     Shellfish Allergy Swelling     Home Medications  Prior to Admission medications   Medication Sig Start Date End Date Taking? Authorizing Provider  allopurinol (ZYLOPRIM) 300 MG tablet Take 300 mg by mouth at bedtime. 06/30/11  Yes [provider]  apixaban (ELIQUIS) 5 MG TABS tablet Take 1 tablet (5 mg total) by mouth 2 (two) times daily. 04/22/22 06/26/22 Yes Ghimire, Dante Gang, MD  CRESTOR 20 MG tablet Take 20 mg by mouth daily.  06/30/11  Yes [provider]  fluticasone (FLONASE) 50 MCG/ACT nasal spray INSTILL 2 SPRAYS IN EACH NOSTRIL DAILY 12/13/21  Yes [provider]  levothyroxine (SYNTHROID, LEVOTHROID) 50 MCG tablet Take 50 mcg by mouth daily. 02/18/15  Yes [provider]  loperamide (IMODIUM) 2 MG capsule Take 2 capsules (4 mg total) by mouth every 8 (eight) hours as needed for diarrhea or loose stools. 04/22/22  Yes Barb Merino, MD  metoprolol tartrate (LOPRESSOR) 50 MG tablet Take 1 tablet (50 mg total) by mouth 2 (two) times daily. 06/20/22  Yes Weaver, Scott T, PA-C  omeprazole (PRILOSEC) 40 MG capsule Take 40 mg by mouth 2 (two) times daily. 01/31/21  Yes [provider]  oxyCODONE (OXY IR/ROXICODONE) 5 MG immediate release tablet Take 1 tablet (5 mg total) by mouth every 6 (six) hours as needed for moderate pain, severe pain or breakthrough pain. 04/22/22  Yes Donnie Mesa, MD  Probiotic Product (ALIGN) 4 MG CAPS Take 1 capsule by mouth daily.   Yes [provider]  albuterol (VENTOLIN HFA) 108 (90 Base) MCG/ACT inhaler Inhale 2 puffs into the lungs every 6 (six) hours as needed for wheezing or shortness of breath. 04/22/22   Barb Merino, MD     Critical care time: 31 min

## 2022-06-26 NOTE — ED Provider Notes (Addendum)
[facility] Provider Note   CSN: [id number] Arrival date & time: 06/26/22  1432     History  Chief Complaint  Patient presents with   Shortness of Breath    Kyle Delacruz is a 67 y.o. male.   Shortness of Breath    Patient has a history of GERD, hyperlipidemia, gout, diabetes, obstructive sleep apnea, DVT, status post colectomy end ileostomy who presents to the ED with complaints of shortness of breath, back pain, abdominal discomfort.  Patient states he started having the symptoms on Sunday.  They have progressed over the last couple of days.  He has not had any fevers but he has been feeling an upset stomach.  He has had some discomfort in his upper abdomen.  He is also having pain in his back and has been feeling short of breath.  He also felt lightheaded and got very fatigued when he tried to get up and walk around.  Home Medications Prior to Admission medications   Medication Sig Start Date End Date Taking? Authorizing Provider  allopurinol (ZYLOPRIM) 300 MG tablet Take 300 mg by mouth at bedtime. 06/30/11  Yes [provider]  apixaban (ELIQUIS) 5 MG TABS tablet Take 1 tablet (5 mg total) by mouth 2 (two) times daily. 04/22/22 06/26/22 Yes Ghimire, Dante Gang, MD  CRESTOR 20 MG tablet Take 20 mg by mouth daily.  06/30/11  Yes [provider]  fluticasone (FLONASE) 50 MCG/ACT nasal spray INSTILL 2 SPRAYS IN EACH NOSTRIL DAILY 12/13/21  Yes [provider]  levothyroxine (SYNTHROID, LEVOTHROID) 50 MCG tablet Take 50 mcg by mouth daily. 02/18/15  Yes [provider]  loperamide (IMODIUM) 2 MG capsule Take 2 capsules (4 mg total) by mouth every 8 (eight) hours as needed for diarrhea or loose stools. 04/22/22  Yes Barb Merino, MD  metoprolol tartrate (LOPRESSOR) 50 MG tablet Take 1 tablet (50 mg total) by mouth 2 (two) times daily. 06/20/22  Yes Weaver, Scott T, PA-C  omeprazole (PRILOSEC) 40 MG capsule Take 40  mg by mouth 2 (two) times daily. 01/31/21  Yes [provider]  oxyCODONE (OXY IR/ROXICODONE) 5 MG immediate release tablet Take 1 tablet (5 mg total) by mouth every 6 (six) hours as needed for moderate pain, severe pain or breakthrough pain. 04/22/22  Yes Donnie Mesa, MD  Probiotic Product (ALIGN) 4 MG CAPS Take 1 capsule by mouth daily.   Yes [provider]  albuterol (VENTOLIN HFA) 108 (90 Base) MCG/ACT inhaler Inhale 2 puffs into the lungs every 6 (six) hours as needed for wheezing or shortness of breath. 04/22/22   Barb Merino, MD      Allergies    Bee venom, Lisinopril, Metoclopramide, Penicillins, and Shellfish allergy    Review of Systems   Review of Systems  Respiratory:  Positive for shortness of breath.     Physical Exam Updated Vital Signs BP 122/78   Pulse (!) 105   Temp 98.9 F (37.2 C) (Oral)   Resp (!) 26   Ht 1.778 m (5' 10"$ )   Wt 92.1 kg   SpO2 100%   BMI 29.13 kg/m  Physical Exam Vitals and nursing note reviewed.  Constitutional:      Appearance: He is well-developed. He is not diaphoretic.  HENT:     Head: Normocephalic and atraumatic.     Right Ear: External ear normal.     Left Ear: External ear normal.  Eyes:     General: No  scleral icterus.       Right eye: No discharge.        Left eye: No discharge.     Conjunctiva/sclera: Conjunctivae normal.  Neck:     Trachea: No tracheal deviation.  Cardiovascular:     Rate and Rhythm: Normal rate and regular rhythm.  Pulmonary:     Effort: Pulmonary effort is normal. No respiratory distress.     Breath sounds: Normal breath sounds. No stridor. No wheezing or rales.  Abdominal:     General: Bowel sounds are normal. There is no distension.     Palpations: Abdomen is soft.     Tenderness: There is abdominal tenderness. There is no guarding or rebound.     Comments: Ileostomy right abdomen, no gross blood noted in ileostomy bag  Musculoskeletal:        General: No tenderness or  deformity.     Cervical back: Neck supple.  Skin:    General: Skin is warm and dry.     Findings: No rash.  Neurological:     General: No focal deficit present.     Mental Status: He is alert.     Cranial Nerves: No cranial nerve deficit, dysarthria or facial asymmetry.     Sensory: No sensory deficit.     Motor: No abnormal muscle tone or seizure activity.     Coordination: Coordination normal.  Psychiatric:        Mood and Affect: Mood normal.     ED Results / Procedures / Treatments   Labs (all labs ordered are listed, but only abnormal results are displayed) Labs Reviewed  COMPREHENSIVE METABOLIC PANEL - Abnormal; Notable for the following components:      Result Value   Sodium 133 (*)    Chloride 95 (*)    CO2 19 (*)    Glucose, Bld 131 (*)    BUN 25 (*)    Creatinine, Ser 2.81 (*)    Total Bilirubin 2.6 (*)    GFR, Estimated 24 (*)    Anion gap 19 (*)    All other components within normal limits  LACTIC ACID, PLASMA - Abnormal; Notable for the following components:   Lactic Acid, Venous 7.6 (*)    All other components within normal limits  LACTIC ACID, PLASMA - Abnormal; Notable for the following components:   Lactic Acid, Venous 4.6 (*)    All other components within normal limits  CBC WITH DIFFERENTIAL/PLATELET - Abnormal; Notable for the following components:   WBC 31.3 (*)    Hemoglobin 12.1 (*)    HCT 38.9 (*)    RDW 17.6 (*)    Neutro Abs 28.5 (*)    Monocytes Absolute 1.3 (*)    Abs Immature Granulocytes 0.60 (*)    All other components within normal limits  PROTIME-INR - Abnormal; Notable for the following components:   Prothrombin Time 27.5 (*)    INR 2.6 (*)    All other components within normal limits  APTT - Abnormal; Notable for the following components:   aPTT 49 (*)    All other components within normal limits  RESP PANEL BY RT-PCR (RSV, FLU A&B, COVID)  RVPGX2  CULTURE, BLOOD (ROUTINE X 2)  CULTURE, BLOOD (ROUTINE X 2)  RESP PANEL BY  RT-PCR (RSV, FLU A&B, COVID)  RVPGX2  CULTURE, BLOOD (ROUTINE X 2)  CULTURE, BLOOD (ROUTINE X 2)  URINALYSIS, W/ REFLEX TO CULTURE (INFECTION SUSPECTED)  TROPONIN I (HIGH SENSITIVITY)  TROPONIN I (HIGH SENSITIVITY)  EKG None  Radiology CT ABDOMEN PELVIS WO CONTRAST  Result Date: 06/26/2022 CLINICAL DATA:  Acute nonlocalized abdominal pain. Sepsis. Acute kidney injury. Back pain and shortness of breath beginning Sunday. Colon resection on 11/23. EXAM: CT ABDOMEN AND PELVIS WITHOUT CONTRAST TECHNIQUE: Multidetector CT imaging of the abdomen and pelvis was performed following the standard protocol without IV contrast. RADIATION DOSE REDUCTION: This exam was performed according to the departmental dose-optimization program which includes automated exposure control, adjustment of the mA and/or kV according to patient size and/or use of iterative reconstruction technique. COMPARISON:  04/10/2022 FINDINGS: Lower chest: Atelectasis or consolidation in the right lung base. Minimal right pleural effusion. These changes are improved since prior study. Calcification in the coronary arteries. Hepatobiliary: No focal liver abnormality is seen. No gallstones, gallbladder wall thickening, or biliary dilatation. Pancreas: Unremarkable. No pancreatic ductal dilatation or surrounding inflammatory changes. Spleen: Normal in size without focal abnormality. Adrenals/Urinary Tract: No adrenal gland nodules. Several punctate sized stones in both kidneys, largest measuring about 2 mm diameter. No hydronephrosis or hydroureter. Bladder is decompressed but the bladder wall appears diffusely thickened. This could indicate cystitis or outlet obstruction. Stomach/Bowel: Stomach is decompressed. Gastric wall appears thickened but this could just be due to under distention. Consider possibility of gastritis. Small bowel are not abnormally distended. Postoperative changes with partial colectomy, right lower quadrant ileostomy, and  sigmoid colon stump. Small bowel and remaining colon are not abnormally distended. Vascular/Lymphatic: Calcification of the aorta. No aneurysm. No significant lymphadenopathy. Reproductive: Prostate gland is diffusely enlarged. Other: Residual scarring and edema in the right lower quadrant and right pararenal spaces, likely postoperative. The previous hematoma seen in this area has been resected or resolved in the interval. Minimal residual subcapsular liver fluid collection. Resolution of previous perisplenic fluid. Musculoskeletal: Bilateral hip arthroplasties. Postoperative fixation of the lumbar spine. Degenerative changes in the lumbar spine and SI joints. IMPRESSION: 1. Interval improvement since prior study with resection or resorption of the previous right lower quadrant hematoma. Mild residual stranding and edema in this area today is likely postoperative. 2. Small residual right pleural effusion and basilar atelectasis/consolidation, improved since prior study. 3. Multiple bilateral nonobstructing intrarenal stones. 4. Gastric wall appears thickened, possibly due to under distention or possibly gastritis. 5. Bladder wall is thickened, possibly due to cystitis or outlet obstruction. The prostate gland is enlarged. 6. Interval postoperative changes with partial colectomy and right lower quadrant ileostomy with sigmoid colon stump. 7. Aortic atherosclerosis. Electronically Signed   By: Lucienne Capers M.D.   On: 06/26/2022 17:50   DG Chest Portable 1 View  Result Date: 06/26/2022 CLINICAL DATA:  Shortness of breath EXAM: PORTABLE CHEST 1 VIEW COMPARISON:  04/17/2022, 08/19/2019 FINDINGS: Chronic elevation of the right diaphragm. Subsegmental atelectasis right base. No acute airspace disease or pleural effusion. Small clips over the left upper mediastinum as before. Cardiac size within normal limits. No pneumothorax IMPRESSION: No active disease. Chronic elevation of the right diaphragm with subsegmental  atelectasis at the right base. Electronically Signed   By: Donavan Foil M.D.   On: 06/26/2022 15:28    Procedures .Critical Care  Performed by: Dorie Rank, MD Authorized by: Dorie Rank, MD   Critical care provider statement:    Critical care time (minutes):  45   Critical care was time spent personally by me on the following activities:  Development of treatment plan with patient or surrogate, discussions with consultants, evaluation of patient's response to treatment, examination of patient, ordering and review of laboratory  studies, ordering and review of radiographic studies, ordering and performing treatments and interventions, pulse oximetry, re-evaluation of patient's condition and review of old charts     Medications Ordered in ED Medications  lactated ringers infusion ( Intravenous New Bag/Given 06/26/22 1553)  ceFEPIme (MAXIPIME) 2 g in sodium chloride 0.9 % 100 mL IVPB (has no administration in time range)  vancomycin variable dose per unstable renal function (pharmacist dosing) (has no administration in time range)  0.9 %  sodium chloride infusion (250 mLs Intravenous Not Given 06/26/22 1738)  norepinephrine (LEVOPHED) 34m in 2550m(0.016 mg/mL) premix infusion (3 mcg/min Intravenous Rate/Dose Change 06/26/22 1812)  lactated ringers bolus 1,000 mL (0 mLs Intravenous Stopped 06/26/22 1731)    And  lactated ringers bolus 1,000 mL (0 mLs Intravenous Stopped 06/26/22 1731)    And  lactated ringers bolus 1,000 mL (0 mLs Intravenous Stopped 06/26/22 1824)  ceFEPIme (MAXIPIME) 2 g in sodium chloride 0.9 % 100 mL IVPB (0 g Intravenous Stopped 06/26/22 1623)  metroNIDAZOLE (FLAGYL) IVPB 500 mg (0 mg Intravenous Stopped 06/26/22 1731)  vancomycin (VANCOCIN) IVPB 1000 mg/200 mL premix (0 mg Intravenous Stopped 06/26/22 1801)    Followed by  vancomycin (VANCOCIN) IVPB 1000 mg/200 mL premix (0 mg Intravenous Stopped 06/26/22 1901)  lactated ringers bolus 1,000 mL (0 mLs Intravenous Stopped 06/26/22  1824)    ED Course/ Medical Decision Making/ A&P Clinical Course as of 06/26/22 2106  Tue Jun 26, 2022  1559 DG Chest Portable 1 View Chest x-ray does not show evidence of pneumonia [JK]  1559 Lactic acid, plasma(!!) Lactic acid level severely elevated at 7.6.  Patient has an AKI with BUN and creatinine of 25 and 2.8. [JK]  1600 CBC with Differential(!) Leukocytosis with a white count of 31.3.  Presentation concerning for acute sepsis and AKI [JK]  1718 Patient still requires his third IV fluid bolus.  Will go ahead and add peripheral pressors considering his persistent hypotension.  Will also add an additional bolus, patient has an AKI and appears dry [JK]  1846 CT scan shows interval improvement since prior study with resorb some of the previous right lower quadrant hematoma.  Patient has residual small right pleural effusion.  Multiple bilateral nonobstructing renal stones noted.  Possible gastritis or cystitis also noted [JK]  1914 Blood pressure is improving.  Low 100s now up to 122.  Patient is currently on Levophed at 3 mics per minute.  Patient's presentation consistent with sepsis.  Will consult with intensivist for admission [JK]  2016 Case discussed with Dr. MaRuthann Canceregarding admission.  Blood pressure continues to improve [JK]  2105 Urinalysis does suggest infection.  Likely source of his illness [JK]    Clinical Course User Index [JK] KnDorie RankMD                             Medical Decision Making Problems Addressed: AKI (acute kidney injury) ([facility] acute illness or [facility] that poses a threat to life or bodily functions Sepsis, due to unspecified organism, unspecified whether acute organ dysfunction present ([facility] or injury that poses a threat to life or bodily functions  Amount and/or Complexity of Data Reviewed Labs: ordered. Decision-making details documented in ED Course. Radiology: ordered and independent interpretation performed. Decision-making  details documented in ED Course. ECG/medicine tests: ordered.  Risk OTC drugs. Prescription drug management. Decision regarding hospitalization.   Patient presented to the ED for evaluation of weakness noted  to be hypotensive on arrival.  Patient's presentation suggestive of sepsis.  Patient has AKI and elevated lactic acidosis with significant leukocytosis.  Etiology is infection unclear at this time.  He does not have pneumonia.  No evidence of COVID or flu.  I am concerned about the possibility of UTI.  Urinalysis still currently pending.  Patient has been started on broad-spectrum antibiotics.  He also has been treated with IV fluid hydration and use of pressors considering his persistent hypotension.  He does seem to be improving at this time.  I will consult with the critical care service to arrange for admission.        Final Clinical Impression(s) / ED Diagnoses Final diagnoses:  Sepsis, due to unspecified organism, unspecified whether acute organ dysfunction present [facility])  AKI (acute kidney injury) [facility])    Rx / DC Orders ED Discharge Orders     None         Dorie Rank, MD 06/26/22 2016    Dorie Rank, MD 06/26/22 2106

## 2022-06-26 NOTE — Sepsis Progress Note (Signed)
Sepsis protocol monitored by eLink ?

## 2022-06-27 ENCOUNTER — Inpatient Hospital Stay (HOSPITAL_COMMUNITY): Payer: Medicare Other

## 2022-06-27 DIAGNOSIS — R7881 Bacteremia: Secondary | ICD-10-CM | POA: Diagnosis not present

## 2022-06-27 DIAGNOSIS — A419 Sepsis, unspecified organism: Secondary | ICD-10-CM | POA: Diagnosis not present

## 2022-06-27 LAB — BASIC METABOLIC PANEL
Anion gap: 14 (ref 5–15)
BUN: 27 mg/dL — ABNORMAL HIGH (ref 8–23)
CO2: 21 mmol/L — ABNORMAL LOW (ref 22–32)
Calcium: 8.2 mg/dL — ABNORMAL LOW (ref 8.9–10.3)
Chloride: 98 mmol/L (ref 98–111)
Creatinine, Ser: 2.21 mg/dL — ABNORMAL HIGH (ref 0.61–1.24)
GFR, Estimated: 32 mL/min — ABNORMAL LOW (ref >60)
Glucose, Bld: 99 mg/dL (ref 70–99)
Potassium: 3.8 mmol/L (ref 3.5–5.1)
Sodium: 133 mmol/L — ABNORMAL LOW (ref 135–145)

## 2022-06-27 LAB — BLOOD CULTURE ID PANEL (REFLEXED) - BCID2

## 2022-06-27 LAB — GLUCOSE, CAPILLARY
Glucose-Capillary: 108 mg/dL — ABNORMAL HIGH (ref 70–99)
Glucose-Capillary: 115 mg/dL — ABNORMAL HIGH (ref 70–99)
Glucose-Capillary: 119 mg/dL — ABNORMAL HIGH (ref 70–99)
Glucose-Capillary: 126 mg/dL — ABNORMAL HIGH (ref 70–99)
Glucose-Capillary: 157 mg/dL — ABNORMAL HIGH (ref 70–99)
Glucose-Capillary: 88 mg/dL (ref 70–99)
Glucose-Capillary: 92 mg/dL (ref 70–99)

## 2022-06-27 LAB — CBC
HCT: 31.6 % — ABNORMAL LOW (ref 39.0–52.0)
HCT: 32.9 % — ABNORMAL LOW (ref 39.0–52.0)
Hemoglobin: 10 g/dL — ABNORMAL LOW (ref 13.0–17.0)
Hemoglobin: 9.7 g/dL — ABNORMAL LOW (ref 13.0–17.0)
MCH: 25.8 pg — ABNORMAL LOW (ref 26.0–34.0)
MCH: 25.9 pg — ABNORMAL LOW (ref 26.0–34.0)
MCHC: 30.4 g/dL (ref 30.0–36.0)
MCHC: 30.7 g/dL (ref 30.0–36.0)
MCV: 84.5 fL (ref 80.0–100.0)
MCV: 85 fL (ref 80.0–100.0)
Platelets: 142 10*3/uL — ABNORMAL LOW (ref 150–400)
Platelets: 160 10*3/uL (ref 150–400)
RBC: 3.74 MIL/uL — ABNORMAL LOW (ref 4.22–5.81)
RBC: 3.87 MIL/uL — ABNORMAL LOW (ref 4.22–5.81)
RDW: 17.2 % — ABNORMAL HIGH (ref 11.5–15.5)
RDW: 17.3 % — ABNORMAL HIGH (ref 11.5–15.5)
WBC: 15.8 10*3/uL — ABNORMAL HIGH (ref 4.0–10.5)
WBC: 21.9 10*3/uL — ABNORMAL HIGH (ref 4.0–10.5)
nRBC: 0 % (ref 0.0–0.2)
nRBC: 0 % (ref 0.0–0.2)

## 2022-06-27 LAB — ECHOCARDIOGRAM LIMITED
Height: 70 in
Weight: 3389.79 oz

## 2022-06-27 LAB — GASTROINTESTINAL PANEL BY PCR, STOOL (REPLACES STOOL CULTURE)

## 2022-06-27 LAB — LACTIC ACID, PLASMA
Lactic Acid, Venous: 1.9 mmol/L (ref 0.5–1.9)
Lactic Acid, Venous: 3.3 mmol/L (ref 0.5–1.9)
Lactic Acid, Venous: 2.2 mmol/L (ref 0.5–1.9)

## 2022-06-27 LAB — PHOSPHORUS
Phosphorus: 3.8 mg/dL (ref 2.5–4.6)
Phosphorus: 3.6 mg/dL (ref 2.5–4.6)

## 2022-06-27 LAB — URINE CULTURE: Culture: <10000 — AB

## 2022-06-27 LAB — COMPREHENSIVE METABOLIC PANEL
ALT: 17 U/L (ref 0–44)
AST: 24 U/L (ref 15–41)
Albumin: 2.3 g/dL — ABNORMAL LOW (ref 3.5–5.0)
Alkaline Phosphatase: 67 U/L (ref 38–126)
Anion gap: 11 (ref 5–15)
BUN: 27 mg/dL — ABNORMAL HIGH (ref 8–23)
CO2: 22 mmol/L (ref 22–32)
Calcium: 8 mg/dL — ABNORMAL LOW (ref 8.9–10.3)
Chloride: 98 mmol/L (ref 98–111)
Creatinine, Ser: 2.35 mg/dL — ABNORMAL HIGH (ref 0.61–1.24)
GFR, Estimated: 30 mL/min — ABNORMAL LOW (ref >60)
Glucose, Bld: 117 mg/dL — ABNORMAL HIGH (ref 70–99)
Potassium: 4.2 mmol/L (ref 3.5–5.1)
Sodium: 131 mmol/L — ABNORMAL LOW (ref 135–145)
Total Bilirubin: 2.1 mg/dL — ABNORMAL HIGH (ref 0.3–1.2)
Total Protein: 5.8 g/dL — ABNORMAL LOW (ref 6.5–8.1)

## 2022-06-27 LAB — MAGNESIUM
Magnesium: 0.9 mg/dL — CL (ref 1.7–2.4)
Magnesium: 1.8 mg/dL (ref 1.7–2.4)

## 2022-06-27 LAB — PROCALCITONIN: Procalcitonin: 41.78 ng/mL

## 2022-06-27 LAB — MRSA NEXT GEN BY PCR, NASAL: MRSA by PCR Next Gen: NOT DETECTED

## 2022-06-27 MED ORDER — ARFORMOTEROL TARTRATE 15 MCG/2ML IN NEBU
15.0000 ug | INHALATION_SOLUTION | Freq: Two times a day (BID) | RESPIRATORY_TRACT | Status: DC
  Administered 2022-06-27 – 2022-06-30 (×8): 15 ug via RESPIRATORY_TRACT
  Filled 2022-06-27 (×8): qty 2

## 2022-06-27 MED ORDER — ALLOPURINOL 300 MG PO TABS
300.0000 mg | ORAL_TABLET | Freq: Every day | ORAL | Status: DC
  Administered 2022-06-27 – 2022-06-28 (×3): 300 mg via ORAL
  Filled 2022-06-27 (×3): qty 1

## 2022-06-27 MED ORDER — METOPROLOL TARTRATE 5 MG/5ML IV SOLN
2.5000 mg | Freq: Once | INTRAVENOUS | Status: AC
  Administered 2022-06-27: 2.5 mg via INTRAVENOUS
  Filled 2022-06-27: qty 5

## 2022-06-27 MED ORDER — MAGNESIUM SULFATE 50 % IJ SOLN
6.0000 g | Freq: Once | INTRAVENOUS | Status: AC
  Administered 2022-06-27: 6 g via INTRAVENOUS
  Filled 2022-06-27: qty 12

## 2022-06-27 MED ORDER — METRONIDAZOLE 500 MG/100ML IV SOLN
500.0000 mg | Freq: Two times a day (BID) | INTRAVENOUS | Status: DC
  Administered 2022-06-27 – 2022-06-28 (×3): 500 mg via INTRAVENOUS
  Filled 2022-06-27 (×3): qty 100

## 2022-06-27 MED ORDER — SODIUM CHLORIDE 0.9 % IV SOLN
2.0000 g | Freq: Two times a day (BID) | INTRAVENOUS | Status: DC
  Administered 2022-06-27 – 2022-06-29 (×6): 2 g via INTRAVENOUS
  Filled 2022-06-27 (×6): qty 12.5

## 2022-06-27 MED ORDER — IPRATROPIUM-ALBUTEROL 0.5-2.5 (3) MG/3ML IN SOLN: 3.0000 mL | Freq: Four times a day (QID) | RESPIRATORY_TRACT | Status: DC | PRN

## 2022-06-27 MED ORDER — METOPROLOL TARTRATE 5 MG/5ML IV SOLN: 2.5000 mg | INTRAVENOUS | Status: DC | PRN

## 2022-06-27 MED ORDER — ROSUVASTATIN CALCIUM 20 MG PO TABS
20.0000 mg | ORAL_TABLET | Freq: Every day | ORAL | Status: DC
  Administered 2022-06-27 – 2022-06-30 (×4): 20 mg via ORAL
  Filled 2022-06-27 (×4): qty 1

## 2022-06-27 MED ORDER — ENSURE ENLIVE PO LIQD
237.0000 mL | Freq: Two times a day (BID) | ORAL | Status: DC
  Administered 2022-06-27 – 2022-06-28 (×2): 237 mL via ORAL

## 2022-06-27 MED ORDER — POTASSIUM CHLORIDE CRYS ER 20 MEQ PO TBCR
20.0000 meq | EXTENDED_RELEASE_TABLET | Freq: Once | ORAL | Status: AC
  Administered 2022-06-27: 20 meq via ORAL
  Filled 2022-06-27: qty 1

## 2022-06-27 MED ORDER — MAGNESIUM SULFATE 2 GM/50ML IV SOLN
2.0000 g | Freq: Once | INTRAVENOUS | Status: AC
  Administered 2022-06-27: 2 g via INTRAVENOUS
  Filled 2022-06-27: qty 50

## 2022-06-27 MED ORDER — OXYCODONE HCL 5 MG PO TABS
5.0000 mg | ORAL_TABLET | Freq: Four times a day (QID) | ORAL | Status: DC | PRN
  Administered 2022-06-27 – 2022-06-30 (×4): 5 mg via ORAL
  Filled 2022-06-27 (×4): qty 1

## 2022-06-27 MED ORDER — BUDESONIDE 0.5 MG/2ML IN SUSP
0.5000 mg | Freq: Two times a day (BID) | RESPIRATORY_TRACT | Status: DC
  Administered 2022-06-27 – 2022-06-30 (×6): 0.5 mg via RESPIRATORY_TRACT
  Filled 2022-06-27 (×6): qty 2

## 2022-06-27 MED ORDER — ADULT MULTIVITAMIN W/MINERALS CH
1.0000 | ORAL_TABLET | Freq: Every day | ORAL | Status: DC
  Administered 2022-06-27 – 2022-06-30 (×4): 1 via ORAL
  Filled 2022-06-27 (×4): qty 1

## 2022-06-27 MED ORDER — APIXABAN 5 MG PO TABS
5.0000 mg | ORAL_TABLET | Freq: Two times a day (BID) | ORAL | Status: DC
  Administered 2022-06-27 – 2022-06-30 (×7): 5 mg via ORAL
  Filled 2022-06-27 (×7): qty 1

## 2022-06-27 MED ORDER — BUDESONIDE 0.25 MG/2ML IN SUSP
0.2500 mg | Freq: Two times a day (BID) | RESPIRATORY_TRACT | Status: DC
  Administered 2022-06-27 (×2): 0.25 mg via RESPIRATORY_TRACT
  Filled 2022-06-27 (×2): qty 2

## 2022-06-27 MED ORDER — PANTOPRAZOLE SODIUM 40 MG PO TBEC
40.0000 mg | DELAYED_RELEASE_TABLET | Freq: Every day | ORAL | Status: DC
  Administered 2022-06-27 – 2022-06-30 (×4): 40 mg via ORAL
  Filled 2022-06-27 (×4): qty 1

## 2022-06-27 NOTE — Progress Notes (Signed)
PHARMACY - PHYSICIAN COMMUNICATION CRITICAL VALUE ALERT - BLOOD CULTURE IDENTIFICATION (BCID)  Kyle Delacruz is an 67 y.o. male who presented to [facility] on 06/26/2022 with a chief complaint of sepsis  Assessment:  Enterobacter cloacae bacteremia (?GI source)  Name of physician (or Provider) Contacted: Dr. Lucile Shutters  Current antibiotics: Cefepime and Flagyl  Changes to prescribed antibiotics recommended:  Patient is on recommended antibiotics - No changes needed  Results for orders placed or performed during the hospital encounter of 06/26/22  Blood Culture ID Panel (Reflexed) (Collected: 06/26/2022  3:52 PM)  Result Value Ref Range   Enterococcus faecalis NOT DETECTED NOT DETECTED   Enterococcus Faecium NOT DETECTED NOT DETECTED   Listeria monocytogenes NOT DETECTED NOT DETECTED   Staphylococcus species NOT DETECTED NOT DETECTED   Staphylococcus aureus (BCID) NOT DETECTED NOT DETECTED   Staphylococcus epidermidis NOT DETECTED NOT DETECTED   Staphylococcus lugdunensis NOT DETECTED NOT DETECTED   Streptococcus species NOT DETECTED NOT DETECTED   Streptococcus agalactiae NOT DETECTED NOT DETECTED   Streptococcus pneumoniae NOT DETECTED NOT DETECTED   Streptococcus pyogenes NOT DETECTED NOT DETECTED   A.calcoaceticus-baumannii NOT DETECTED NOT DETECTED   Bacteroides fragilis NOT DETECTED NOT DETECTED   Enterobacterales DETECTED (A) NOT DETECTED   Enterobacter cloacae complex DETECTED (A) NOT DETECTED   Escherichia coli NOT DETECTED NOT DETECTED   Klebsiella aerogenes NOT DETECTED NOT DETECTED   Klebsiella oxytoca NOT DETECTED NOT DETECTED   Klebsiella pneumoniae NOT DETECTED NOT DETECTED   Proteus species NOT DETECTED NOT DETECTED   Salmonella species NOT DETECTED NOT DETECTED   Serratia marcescens NOT DETECTED NOT DETECTED   Haemophilus influenzae NOT DETECTED NOT DETECTED   Neisseria meningitidis NOT DETECTED NOT DETECTED   Pseudomonas aeruginosa NOT DETECTED NOT DETECTED    Stenotrophomonas maltophilia NOT DETECTED NOT DETECTED   Candida albicans NOT DETECTED NOT DETECTED   Candida auris NOT DETECTED NOT DETECTED   Candida glabrata NOT DETECTED NOT DETECTED   Candida krusei NOT DETECTED NOT DETECTED   Candida parapsilosis NOT DETECTED NOT DETECTED   Candida tropicalis NOT DETECTED NOT DETECTED   Cryptococcus neoformans/gattii NOT DETECTED NOT DETECTED   CTX-M ESBL NOT DETECTED NOT DETECTED   Carbapenem resistance IMP NOT DETECTED NOT DETECTED   Carbapenem resistance KPC NOT DETECTED NOT DETECTED   Carbapenem resistance NDM NOT DETECTED NOT DETECTED   Carbapenem resist OXA 48 LIKE NOT DETECTED NOT DETECTED   Carbapenem resistance VIM NOT DETECTED NOT DETECTED    Sherlon Handing, PharmD, BCPS Please see amion for complete clinical pharmacist phone list 06/27/2022  8:06 PM

## 2022-06-27 NOTE — Progress Notes (Addendum)
eLink Physician-Brief Progress Note Patient Name: MELDRICK VANDENBUSH DOB: 07/21/1955 MRN: [id number]   Date of Service  06/27/2022  HPI/Events of Note  67 year old male initially presented to the emergency department with right groin pain, shortness of breath, chills and fevers up to 103 who developed hypoxemic respiratory failure requiring 3 to 5 L of supplemental oxygen and shock thought to be secondary to sepsis requiring norepinephrine.  He is admitted to the intensive care unit for management of his shock and respiratory failure.  Upon arrival, he is no longer requiring norepinephrine after several liters of crystalloid solution.  He is off oxygen saturating 97%.  He is in no apparent distress and states that his shortness of breath is improved.  Urinalysis is consistent with acute infection, recently had Enterobacter in urine.  Blood cultures been sent.  No significant radiographic abnormalities on chest radiograph.  CT of the abdomen/pelvis reviewed, no obvious acute findings, somewhat chronic changes still present.  Reporting some back pain which is chronic for him, usually takes oxycodone twice a week for it  eICU Interventions  Resume oxycodone  Hold home inhalers, start long-acting SVNs and as needed short acting SVNs  Antibiotics per admitting team.  Resume some of his home meds for comfort.   0133 - Mg 0.9, Cr 2.35; severe hypomagnesemia and acute kidney injury.  Continue IVF.  Administer magnesium sulfate.  0423 - lactic 1.9 -> 3.3.  Has LR infusing @ 150 ml/hr.  Has rec'd 4L LR boluses.  Has had 350 ml UOP since admit; Continue to observe, no intervention for now.  Intervention Category Evaluation Type: New Patient Evaluation  Rudi Bunyard 06/27/2022, 12:02 AM

## 2022-06-27 NOTE — Progress Notes (Signed)
  Echocardiogram 2D Echocardiogram has been performed.  Kyle Delacruz 06/27/2022, 5:46 PM

## 2022-06-27 NOTE — Progress Notes (Signed)
Modified Barium Swallow Study  Patient Details  Name: Kyle Delacruz MRN: [id number] Date of Birth: 15-Aug-1955  Today's Date: 06/27/2022  Modified Barium Swallow completed.  Full report located under Chart Review in the Imaging Section.  History of Present Illness Kyle Delacruz is a 67 yo M who presented to the ED with complaints of shortness of breath, back pain, abdominal discomfort. Admitted to ICU with sepsis.  CXR 2/13 with no active disease.  Pt with a history of GERD, hyperlipidemia, gout, diabetes, obstructive sleep apnea, DVT, status post colectomy end ileostomy. He had a posterior cervical diskectomy fusion surgery in March.   Clinical Impression Pt exhibits a primary mild pharyngoesophageal phase dysphagia exacerbated by anatomical differences. 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 This results in decreased distention and trace residue in this area that pt can sense. He had only flash laryngeal penetration of thin liquid x 1 that spontaneously exited the vestibule during the swallow. He cleared his throat several times during the study without material seen in vestibule or trachea. He tends to have a piecemeal transit pattern to his liquids taking 2, sometimes 3 swallows to clear his oral cavity possibly in anticipation of pharyngeal clearance. There was trace vallecular and pyriform sinus residue post swallow. Esophageal scan was unremarkable. Pt educated to alternate solids and sips to facilitate pharyngoesophageal transit, ensure meats are moist (reports difficulty with steak) and small bites/sips and masticate food thoroughly. He does not report difficulty with pills therefore recommend single pills with thin. No further ST is needed. Factors that may increase risk of adverse event in presence of aspiration Phineas Douglas & Padilla 2021):    Swallow Evaluation Recommendations Recommendations: PO  diet PO Diet Recommendation: Regular;Thin liquids (Level 0) Liquid Administration via: Cup;Straw Medication Administration: Whole meds with liquid Supervision: Patient able to self-feed Swallowing strategies  : Follow solids with liquids;Small bites/sips;Slow rate Postural changes: Position pt fully upright for meals Oral care recommendations: Oral care BID (2x/day)      Houston Siren 06/27/2022,2:12 PM

## 2022-06-27 NOTE — Progress Notes (Signed)
Initial Nutrition Assessment  DOCUMENTATION CODES:   Not applicable  INTERVENTION:   - If pt with poor PO intake, liberalize diet to REGULAR to promote PO intake and provide additional food options  - Ensure Enlive po BID, each supplement provides 350 kcal and 20 grams of protein  - Magic Cup TID with meals, each supplement provides 290 kcal and 9 grams of protein  - MVI with minerals daily  NUTRITION DIAGNOSIS:   Increased nutrient needs related to acute illness as evidenced by estimated needs.  GOAL:   Patient will meet greater than or equal to 90% of their needs  MONITOR:   PO intake, Supplement acceptance, Labs, Weight trends, I & O's  REASON FOR ASSESSMENT:   Consult Assessment of nutrition requirement/status, Diet education, Poor PO  ASSESSMENT:   67 year old male who presented to the ED on 2/13 with back pain, SOB, and nausea that began on 06/24/22 and has worsened since then. PMH of PAF, CAD, T2DM, HLD, GERD, hypothyroidism, prostate cancer, intra-abdominal hematoma after lumbar surgery, colonic ileus s/p colectomy and end ileostomy. Pt admitted with septic shock of unclear origin, AKI.  02/14 - s/p MBS with recommendations for regular textures and thin liquids  RD working remotely. Pt had MBS this AM which showed mild pharyngoesophageal phase dysphagia exacerbated by anatomical differences. SLP recommending regular textures with thin liquids.  Unable to reach pt via phone call. Unable to obtain diet history at this time. Based on review of notes, pt with poor PO intake and decrease appetite due to nausea for several days PTA. Reviewed weight history available in chart. Pt with a total weight loss of 10.2 kg since 04/22/22. This is a 9.6% weight loss in 2 months which is severe and significant for timeframe. Suspect pt with some degree of malnutrition but unable to confirm without NFPE.  RD to order oral nutrition supplements to aid pt in meeting increased kcal and  protein needs. Will trial Ensure Enlive and [employer]. Will also add MVI with minerals daily.  Admit weight: 92.1 kg Current weight: 96.1 kg  Medications reviewed and include: protonix, IV abx  Labs reviewed: sodium 133, BUN 27, creatinine 2.21, lactic acid 2.2, WBC 15.8, hemoglobin 10, platelets 142 CBG's: 88-157 x 24 hours  UOP: 900 ml x 12 hours Stool: 150 ml x 12 hours I/O's: +4.9 L since admit  NUTRITION - FOCUSED PHYSICAL EXAM:  Unable to complete at this time. RD working remotely.  Diet Order:   Diet Order             Diet Carb Modified Fluid consistency: Thin; Room service appropriate? Yes  Diet effective now                   EDUCATION NEEDS:   Not appropriate for education at this time  Skin:  Skin Assessment: Reviewed RN Assessment  Last BM:  06/27/22 ileostomy  Height:   Ht Readings from Last 1 Encounters:  06/26/22 5' 10"$  (1.778 m)    Weight:   Wt Readings from Last 1 Encounters:  06/27/22 96.1 kg    Ideal Body Weight:  75.5 kg  BMI:  Body mass index is 30.4 kg/m.  Estimated Nutritional Needs:   Kcal:  2300-2500  Protein:  120-140 grams  Fluid:  >/= 2.2 L    Gustavus Bryant, MS, RD, LDN Inpatient Clinical Dietitian Please see AMiON for contact information.

## 2022-06-27 NOTE — Progress Notes (Addendum)
NAME:  Kyle Delacruz, MRN:  [id number], DOB:  [birth year], LOS: 1 ADMISSION DATE:  06/26/2022, CONSULTATION DATE:  06/26/22 REFERRING MD:  EDP, CHIEF COMPLAINT:  shock, suspected sepsis   History of Present Illness:  67 yo male with pmh listed below presented to [facility] with back pain, sob, and nausea that began Sunday. He endorses that this has worsened since then.   When he presented to the outside facility pt had sats 84-89% on RA, he was placed on 3L [place] with improved sats to high 90's. He was additionally noted to have hyponatremia 133, aki with Cr 2.8 (baseline 0.8), lactate 7.6 with improvement to 4.6 after volume. Wbc 31 with left shift and 8% bandemia. He was recently seen with enterococcus uti and ct abd/pelvis revealed potential cystitis vs gastritis.   Cx obtained, empiric abx and transferred for monitoring with pressor use.   Pt arrived to our ICU on no vasopressors or supplemental oxygen, mentating without complaints other than chronic back pain. Sob and fever/chills have improved per pt. No cp, syncope, vomiting/diarrhea, ha.   Pertinent  Medical History  PAF on eliquis CAD DM2 Hyperlipidemia GERD Hypothyroidism Prostate Ca Back surgery Colonic ileus s/p colectomy/ end ileostomy  H/o PE/ DVT 03/23 and 11/23 (provoked after spinal surgery)  Significant Hospital Events: Including procedures, antibiotic start and stop dates in addition to other pertinent events   Admitted to [facility] 2/13 for septic shock  Interim History / Subjective:  Brief drop in BP (possible arm up in air), normalizing without intervention.  Remains off pressors.  Remains tachycardic.  On room air, denies SOB/ cough C/o of ongoing chronic right groin pain.  Tmax 100  pt/ wife at bedside reports, he ate at [facility] Saturday and felt sick since then, with poor appetite, decreased PO intake, general malaise, output in ileostomy changed from normal paste to liquid with umbilical abd cramps.  Denies any  dysphagia or choking episodes but patient states he can not drink water but able to drink other thin liquids.  Some nausea, no vomiting episodes.  Has frequent reflux despite omeprazole BID at home.  Decreased UOP with frequency and some urinary incontinence and possible blood per wife.   Low grade temp at home, developed acute SOB, wife checked pulse ox and noted HR 140s with normal O2 sats, so presented to ER given tachycardia.  Reports no missed doses of eliquis.  Pt reports chronic back pain, but since his abd surgery noted more RLE weakness having to use a cane/ walker at home.  After lumbar surgery in Nov, was ambulating without any devices.  C/o of chronic R groin pain for months, denies testicular pain.  No swelling or pain in LE   Objective   Blood pressure (!) 119/57, pulse (!) 122, temperature 100 F (37.8 C), temperature source Oral, resp. rate 19, height 5' 10"$  (1.778 m), weight 96.1 kg, SpO2 100 %.        Intake/Output Summary (Last 24 hours) at 06/27/2022 1055 Last data filed at 06/27/2022 0827 Gross per 24 hour  Intake 6875.82 ml  Output 1500 ml  Net 5375.82 ml   Filed Weights   06/26/22 1459 06/27/22 0530  Weight: 92.1 kg 96.1 kg    Examination: General:  Older adult male sitting in bed in NAD HEENT: MM pink/dry, chapped lips, pupils 4/reactive, anicteric Neuro:  Aox3, MAE- reported weakness in RLE (not new) CV: rr, ST, no murmur PULM:  non labored, clear, no wheeze, diminished right base  GI: mildly distended, +bs, ileostomy in place- liquid stool, male purwick Extremities: warm/dry, trace ankle edema  Skin: no rashes  Labs reviewed> Na 133, K 3.8, sCr 2.81> 2.21, phos 3.6, Mag 1.8, WBC 21.9> 15.8, H/H stable, plts 142 Lactic 4.6> 1.9> 3.3  UOP 900 ml Net +5.6L  Resolved Hospital Problem list     Assessment & Plan:  Shock, presumed septic  - possible urinary source, previous enterococcus on UC 04/2022 but asymptomatic and not treated at that time.  Is  symptomatic this time. UA here with leukocytes, neg nitrates, WBC 21-50  R/o bacteremia.  Also having abd cramps/ Nausea and change in stool consistency in ileostomy vs given RLE weakness since 04/2022 and recent lumbar surgery 03/2022> will need imaging at some point.  Will discuss CT vs MRI with attending - CT a/p> ?gastritis, possible cystitis otherwise nothing acute to explain - remains off pressors but remains tachycardic.  MAP goal > 65.  If BP remains stable, will evaluate for transfer out of ICU later today.  - H/H remains stable, restarting eliquis  - cont broad spectrum abx > cefepime/ flagyl / vanc for, narrow as culture data allows - follow cultures/ trend WBC/ fever curve - send stool for GI panel  - repeat lactic given inconsistent trend and soft BP - cont MIVF for now pending SLP   - PPI BID   AKI likely to poor PO intake/ hypotension/ sepsis  - improving sCr, monitor closely  - cont fluids for now till PO better - Trend BMP / urinary output - Replace electrolytes as indicated - Avoid nephrotoxic agents, ensure adequate renal perfusion  Hyponatremia, suspected hypovolemic  - improving after fluids, trend BMET  Lactic acidosis: - pending recheck   ? Dysphagia vs preference of not liking water (does ok with other thin liquids) - seen by SLP, going for MBS, diet per SLP  Prediabetes:  -follow glucose prn -last A1c 5.9 in 04/12/22  H/o pe/dvt: - hx provoked both times.  Resume Eliquis.  H/H stable.  Consider outpt hematology f/u for hypercoagulable workup, ?possible underlying condition  Hypothyroidism:  -cont levothyroxine  GERD - PPI   Back pain w/previous lumbar/ cervical surgeries  - reported new RLE weakness following abd surgery and chronic right groin pain (?neuropathic, no new RLE edema to suspect recurrent DVT/ no missed eliquis doses- as previous large DVT in that leg) - does not take oxy frequently at home.  Wife regulates.  One rx for oxy since back  surgery per pharm.   SOB/ hypoxia - resolved, ?some atelectasis.  CXR clear - mobilize w/ PT, IS  PAF on Eliquis - tele, current NSR - hold metoprolol with soft BP - resume eliquis  - keep K> 4, Mag > 2> replete K and Mag today   HLD - crestor  Protein calorie malnutrition Deconditioning  - we discussed the importance of nutrition for his recovery given multiple hospitalizations/ deconditioning.  Will consult RD.   - PT    Best Practice (right click and "Reselect all SmartList Selections" daily)   Diet/type: pending SLP/ MBS DVT prophylaxis: LMWH> Eliquis  GI prophylaxis: N/A Lines: N/A Foley:  N/A Code Status:  full code Last date of multidisciplinary goals of care discussion [with pt 2/13]  Wife and patient updated 2/14  Labs   CBC: Recent Labs  Lab 06/26/22 1517 06/27/22 0014 06/27/22 0559  WBC 31.3* 21.9* 15.8*  NEUTROABS 28.5*  --   --   HGB 12.1* 9.7* 10.0*  HCT 38.9* 31.6* 32.9*  MCV 83.8 84.5 85.0  PLT 223 160 142*    Basic Metabolic Panel: Recent Labs  Lab 06/26/22 1517 06/27/22 0014 06/27/22 0559  NA 133* 131* 133*  K 3.7 4.2 3.8  CL 95* 98 98  CO2 19* 22 21*  GLUCOSE 131* 117* 99  BUN 25* 27* 27*  CREATININE 2.81* 2.35* 2.21*  CALCIUM 9.5 8.0* 8.2*  MG  --  0.9* 1.8  PHOS  --  3.8 3.6   GFR: Estimated Creatinine Clearance: 38.7 mL/min (A) (by C-G formula based on SCr of 2.21 mg/dL (H)). Recent Labs  Lab 06/26/22 1517 06/26/22 1717 06/27/22 0014 06/27/22 0315 06/27/22 0559  PROCALCITON  --   --  41.78  --   --   WBC 31.3*  --  21.9*  --  15.8*  LATICACIDVEN 7.6* 4.6* 1.9 3.3*  --     Liver Function Tests: Recent Labs  Lab 06/26/22 1517 06/27/22 0014  AST 20 24  ALT 16 17  ALKPHOS 77 67  BILITOT 2.6* 2.1*  PROT 7.8 5.8*  ALBUMIN 3.8 2.3*   No results for input(s): "LIPASE", "AMYLASE" in the last 168 hours. No results for input(s): "AMMONIA" in the last 168 hours.  ABG    Component Value Date/Time   PHART 7.47  (H) 04/11/2022 1026   PCO2ART 38 04/11/2022 1026   PO2ART 115 (H) 04/11/2022 1026   HCO3 27.7 04/11/2022 1026   TCO2 22 04/10/2022 2213   ACIDBASEDEF 4.0 (H) 04/10/2022 2213   O2SAT 99.5 04/11/2022 1026     Coagulation Profile: Recent Labs  Lab 06/26/22 1517  INR 2.6*    Cardiac Enzymes: No results for input(s): "CKTOTAL", "CKMB", "CKMBINDEX", "TROPONINI" in the last 168 hours.  HbA1C: Hgb A1c MFr Bld  Date/Time Value Ref Range Status  04/12/2022 02:40 AM 5.9 (H) 4.8 - 5.6 % Final    Comment:    (NOTE)         Prediabetes: 5.7 - 6.4         Diabetes: >6.4         Glycemic control for adults with diabetes: <7.0     CBG: Recent Labs  Lab 06/27/22 0013 06/27/22 0331 06/27/22 0753  GLUCAP 108* 88 92     Critical care time: 40 min     Amanda Cockayne [facility] 06/27/2022, 10:55 AM  See Amion for pager If no response to pager, please call PCCM consult pager After 7:00 pm call Elink

## 2022-06-27 NOTE — Evaluation (Signed)
Clinical/Bedside Swallow Evaluation Patient Details  Name: Kyle Delacruz MRN: [id number] Date of Birth: Sep 24, 1955  Today's Date: 06/27/2022 Time: SLP Start Time (ACUTE ONLY): [id number] SLP Stop Time (ACUTE ONLY): 0930 SLP Time Calculation (min) (ACUTE ONLY): 7 min  Past Medical History:  Past Medical History:  Diagnosis Date   Allergy    Arthritis    osteo   CTS (carpal tunnel syndrome)    DJD (degenerative joint disease)    DM2 (diabetes mellitus, type 2) ([place])    DVT (deep venous thrombosis) ([facility])    GERD (gastroesophageal reflux disease)    Gout    History of adenomatous polyps of colon 12/29/2019   History of nuclear stress test    Myoview 11/17: EF 54, no ST changes, hypertensive blood pressure response, no ischemia, low risk   Hyperlipidemia    Hypothyroidism    Nicotine addiction    OSA (obstructive sleep apnea)    Prostatitis    Reactive airway disease    Sarcocystosis    Sinus arrhythmia    Sleep apnea    cpap- not wearing   Tinea corporis    Past Surgical History:  Past Surgical History:  Procedure Laterality Date   BILATERAL KNEE ARTHROSCOPY     right- 1997; left 2001   COLONOSCOPY  multiple last 2013   KNEE SURGERY Right 2014   torn R medial meniscus- arthroscopy done   LAPAROTOMY N/A 04/10/2022   Procedure: EXPLORATORY LAPAROTOMY, TOTAL COLECTOMY, END ILEOSTOMY;  Surgeon: Felicie Morn, MD;  Location: [facility];  Service: General;  Laterality: N/A;   PARATHYROIDECTOMY  2005   ROTATOR CUFF REPAIR  2004   right   TONSILLECTOMY     TOTAL HIP ARTHROPLASTY Left 2009   TOTAL HIP ARTHROPLASTY Right 2006   UPPER GASTROINTESTINAL ENDOSCOPY     HPI:  Kyle Delacruz is a 67 yo M who presented to the ED with complaints of shortness of breath, back pain, abdominal discomfort. Admitted to ICU with sepsis.  CXR 2/13 with no active disease.  Pt with a history of GERD, hyperlipidemia, gout, diabetes, obstructive sleep apnea, DVT, status post colectomy end ileostomy.     Assessment / Plan / Recommendation  Clinical Impression  Pt presents with clinical indicators of pharyngeal dysphagia.  Pt describes difficulty drinking water for the past month.  He notes increased mucus production.  He takes omeprazole. He reports feeling of stasis vs water going down the wrong way.  He reports reguritation of thin liquid.  He has switched from drinking water to [employer] green tea, which has provided some relief.  Today there was a mild delayed throat clear with solid texture.  Pt tolerated puree with no clincial s/s of aspiration.  Pt acheived adequate oral clearance.  With thin liquid by cup pt required 3-5 swallows.  Audible swallows noted.  With straw sips pt reported he has a more difficult time and pt required at least 5 swallows.  Pt says he feels like something needs to be opened up and indicates sticking near thyroid cartilege. There was no coughing or choking, but difficulty drinking water is causing pt distress.  CXR 2/13 without active disease. Recommend MBS for further assessment of pharyngeal swallow function to visualize UES and cervical esophagus during swallow.    Recommend continuing regular texture diet with thin liquids pending results of instrumental study planned for later this date.  SLP Visit Diagnosis: Dysphagia, unspecified (R13.10)    Aspiration Risk  Mild aspiration risk  Diet Recommendation Regular;Thin liquid   Liquid Administration via: Cup Medication Administration: Whole meds with puree (for comfort.  may give with water) Supervision: Patient able to self feed Compensations: Slow rate;Small sips/bites Postural Changes: Seated upright at 90 degrees;Remain upright for at least 30 minutes after po intake    Other  Recommendations Oral Care Recommendations: Oral care BID    Recommendations for follow up therapy are one component of a multi-disciplinary discharge planning process, led by the attending physician.  Recommendations may be  updated based on patient status, additional functional criteria and insurance authorization.  Follow up Recommendations  (TBD)      Assistance Recommended at Discharge  N/A  Functional Status Assessment  (TBD)  Frequency and Duration  (TBD)          Prognosis Prognosis for improved oropharyngeal function:  (TBD)      Swallow Study   General Date of Onset: 06/26/22 HPI: Kyle Delacruz is a 67 yo M who presented to the ED with complaints of shortness of breath, back pain, abdominal discomfort. Admitted to ICU with sepsis.  CXR 2/13 with no active disease.  Pt with a history of GERD, hyperlipidemia, gout, diabetes, obstructive sleep apnea, DVT, status post colectomy end ileostomy. Type of Study: Bedside Swallow Evaluation Previous Swallow Assessment: None Diet Prior to this Study: Regular;Thin liquids (Level 0) Temperature Spikes Noted: No Respiratory Status: Nasal cannula History of Recent Intubation: No Behavior/Cognition: Alert;Cooperative;Pleasant mood Oral Cavity Assessment: Within Functional Limits Oral Care Completed by SLP: No Oral Cavity - Dentition: Adequate natural dentition Vision: Functional for self-feeding Self-Feeding Abilities: Able to feed self Patient Positioning: Upright in bed Baseline Vocal Quality: Normal Volitional Cough: Strong Volitional Swallow: Able to elicit    Oral/Motor/Sensory Function Overall Oral Motor/Sensory Function: Within functional limits Facial ROM: Within Functional Limits Facial Symmetry: Within Functional Limits Lingual ROM: Within Functional Limits Lingual Symmetry: Abnormal symmetry left (Could hold at midline with cuing) Lingual Strength: Within Functional Limits Velum: Within Functional Limits Mandible: Within Functional Limits   Ice Chips Ice chips: Not tested   Thin Liquid Thin Liquid: Impaired Pharyngeal  Phase Impairments: Multiple swallows    Nectar Thick Nectar Thick Liquid: Not tested   Honey Thick Honey Thick  Liquid: Not tested   Puree Puree: Within functional limits Presentation: Spoon   Solid     Solid: Impaired Presentation: Self Fed Pharyngeal Phase Impairments: Throat Clearing - Delayed      Celedonio Savage, MA, [place] Office: [phone] 06/27/2022,9:53 AM

## 2022-06-28 DIAGNOSIS — J449 Chronic obstructive pulmonary disease, unspecified: Secondary | ICD-10-CM | POA: Diagnosis not present

## 2022-06-28 DIAGNOSIS — M5416 Radiculopathy, lumbar region: Secondary | ICD-10-CM | POA: Insufficient documentation

## 2022-06-28 DIAGNOSIS — I480 Paroxysmal atrial fibrillation: Secondary | ICD-10-CM

## 2022-06-28 DIAGNOSIS — Z86718 Personal history of other venous thrombosis and embolism: Secondary | ICD-10-CM

## 2022-06-28 DIAGNOSIS — R198 Other specified symptoms and signs involving the digestive system and abdomen: Secondary | ICD-10-CM | POA: Diagnosis not present

## 2022-06-28 DIAGNOSIS — Z86711 Personal history of pulmonary embolism: Secondary | ICD-10-CM | POA: Insufficient documentation

## 2022-06-28 DIAGNOSIS — R6521 Severe sepsis with septic shock: Secondary | ICD-10-CM

## 2022-06-28 DIAGNOSIS — R112 Nausea with vomiting, unspecified: Secondary | ICD-10-CM | POA: Insufficient documentation

## 2022-06-28 DIAGNOSIS — I10 Essential (primary) hypertension: Secondary | ICD-10-CM

## 2022-06-28 DIAGNOSIS — G8929 Other chronic pain: Secondary | ICD-10-CM

## 2022-06-28 DIAGNOSIS — M546 Pain in thoracic spine: Secondary | ICD-10-CM

## 2022-06-28 DIAGNOSIS — K219 Gastro-esophageal reflux disease without esophagitis: Secondary | ICD-10-CM

## 2022-06-28 DIAGNOSIS — Z932 Ileostomy status: Secondary | ICD-10-CM

## 2022-06-28 DIAGNOSIS — A419 Sepsis, unspecified organism: Secondary | ICD-10-CM | POA: Diagnosis not present

## 2022-06-28 DIAGNOSIS — E039 Hypothyroidism, unspecified: Secondary | ICD-10-CM

## 2022-06-28 LAB — BASIC METABOLIC PANEL
Anion gap: 12 (ref 5–15)
BUN: 24 mg/dL — ABNORMAL HIGH (ref 8–23)
CO2: 24 mmol/L (ref 22–32)
Calcium: 8.5 mg/dL — ABNORMAL LOW (ref 8.9–10.3)
Chloride: 103 mmol/L (ref 98–111)
Creatinine, Ser: 2.24 mg/dL — ABNORMAL HIGH (ref 0.61–1.24)
GFR, Estimated: 32 mL/min — ABNORMAL LOW (ref >60)
Glucose, Bld: 137 mg/dL — ABNORMAL HIGH (ref 70–99)
Potassium: 4.3 mmol/L (ref 3.5–5.1)
Sodium: 139 mmol/L (ref 135–145)

## 2022-06-28 LAB — LACTIC ACID, PLASMA: Lactic Acid, Venous: 1.6 mmol/L (ref 0.5–1.9)

## 2022-06-28 LAB — CBC
HCT: 30.6 % — ABNORMAL LOW (ref 39.0–52.0)
Hemoglobin: 9.4 g/dL — ABNORMAL LOW (ref 13.0–17.0)
MCH: 26.1 pg (ref 26.0–34.0)
MCHC: 30.7 g/dL (ref 30.0–36.0)
MCV: 85 fL (ref 80.0–100.0)
Platelets: 163 10*3/uL (ref 150–400)
RBC: 3.6 MIL/uL — ABNORMAL LOW (ref 4.22–5.81)
RDW: 17.4 % — ABNORMAL HIGH (ref 11.5–15.5)
WBC: 10.8 10*3/uL — ABNORMAL HIGH (ref 4.0–10.5)
nRBC: 0 % (ref 0.0–0.2)

## 2022-06-28 LAB — GLUCOSE, CAPILLARY
Glucose-Capillary: 129 mg/dL — ABNORMAL HIGH (ref 70–99)
Glucose-Capillary: 115 mg/dL — ABNORMAL HIGH (ref 70–99)
Glucose-Capillary: 172 mg/dL — ABNORMAL HIGH (ref 70–99)
Glucose-Capillary: 128 mg/dL — ABNORMAL HIGH (ref 70–99)

## 2022-06-28 LAB — HEMOGLOBIN A1C
Hgb A1c MFr Bld: 5.7 % — ABNORMAL HIGH (ref 4.8–5.6)
Mean Plasma Glucose: 116.89 mg/dL

## 2022-06-28 LAB — MAGNESIUM: Magnesium: 3 mg/dL — ABNORMAL HIGH (ref 1.7–2.4)

## 2022-06-28 LAB — TSH: TSH: 1.749 u[IU]/mL (ref 0.350–4.500)

## 2022-06-28 MED ORDER — LACTATED RINGERS IV SOLN: INTRAVENOUS | Status: DC

## 2022-06-28 MED ORDER — METOPROLOL TARTRATE 25 MG PO TABS
25.0000 mg | ORAL_TABLET | Freq: Two times a day (BID) | ORAL | Status: DC
  Administered 2022-06-28 – 2022-06-30 (×5): 25 mg via ORAL
  Filled 2022-06-28 (×5): qty 1

## 2022-06-28 NOTE — Evaluation (Signed)
Physical Therapy Evaluation Patient Details Name: Kyle Delacruz MRN: [id number] DOB: 1956/01/08 Today's Date: 06/28/2022  History of Present Illness  67 yo male admitted 2/13 with SOB, back and abdominal pain, septic shock. PMHx: GERD, HLD, gout, DM, OSA, DVT, bil THA, bil TKA, L4-5 decompression fusion 04/04/22, s/p colectomy end ileostomy 04/10/22  Clinical Impression  Pt pleasant and reports generally sedentary lifestyle at baseline with pt staying in recliner majority of day, using urinal and wife assisting with bathing/dressing. Pt with maintained kyphotic posture with gait and cues for extension. Pt with decreased activity tolerance and function who will benefit from acute therapy to maximize mobility and safety to decrease burden of care. Pt encouraged to be OOB daily with nursing staff and ambulate.   Of note Pt with HR 125-155 with gait with SPO2 >92% on RA       Recommendations for follow up therapy are one component of a multi-disciplinary discharge planning process, led by the attending physician.  Recommendations may be updated based on patient status, additional functional criteria and insurance authorization.  Follow Up Recommendations Home health PT      Assistance Recommended at Discharge Intermittent Supervision/Assistance  Patient can return home with the following  A little help with walking and/or transfers;A lot of help with walking and/or transfers;Assistance with cooking/housework;Assist for transportation;Help with stairs or ramp for entrance    Equipment Recommendations None recommended by PT  Recommendations for Other Services       Functional Status Assessment Patient has had a recent decline in their functional status and/or demonstrates limited ability to make significant improvements in function in a reasonable and predictable amount of time     Precautions / Restrictions Precautions Precautions: Fall Precaution Comments: ostomy Restrictions Weight  Bearing Restrictions: No      Mobility  Bed Mobility Overal bed mobility: Needs Assistance Bed Mobility: Supine to Sit     Supine to sit: HOB elevated     General bed mobility comments: HOB 35 degrees with use of rail and reliant on pulling on mattress and rail to rise, increased time    Transfers Overall transfer level: Needs assistance   Transfers: Sit to/from Stand Sit to Stand: Supervision           General transfer comment: cues for hand placement as pt pulling on RW to stand    Ambulation/Gait Ambulation/Gait assistance: Supervision Gait Distance (Feet): 150 Feet Assistive device: Rolling walker (2 wheels) Gait Pattern/deviations: Step-through pattern, Decreased stride length   Gait velocity interpretation: >2.62 ft/sec, indicative of community ambulatory      Stairs            Wheelchair Mobility    Modified Rankin (Stroke Patients Only)       Balance Overall balance assessment: Mild deficits observed, not formally tested                                           Pertinent Vitals/Pain Pain Assessment Pain Score: 4  Pain Location: back with positioning Pain Descriptors / Indicators: Aching Pain Intervention(s): Limited activity within patient's tolerance, Repositioned, Monitored during session    Home Living Family/patient expects to be discharged to:: Private residence Living Arrangements: Spouse/significant other Available Help at Discharge: Family;Available 24 hours/day Type of Home: House Home Access: Stairs to enter Entrance Stairs-Rails: Right;Left;Can reach both Entrance Stairs-Number of Steps: 4 Alternate Level Stairs-Number of Steps:  14 Home Layout: Two level;Bed/bath upstairs;Full bath on main level Home Equipment: Rolling Walker (2 wheels);Cane - single point;Shower seat - built in;BSC/3in1      Prior Function Prior Level of Function : Needs assist             Mobility Comments: uses RW and only  walking household distance. has HHPT 2x/wk ADLs Comments: wife assisting with bathing and dressing she does all the IADLs     Hand Dominance        Extremity/Trunk Assessment   Upper Extremity Assessment Upper Extremity Assessment: Overall WFL for tasks assessed    Lower Extremity Assessment Lower Extremity Assessment: RLE deficits/detail RLE Deficits / Details: decreased sensation grossly 4/5 strength    Cervical / Trunk Assessment Cervical / Trunk Assessment: Kyphotic  Communication   Communication: No difficulties  Cognition Arousal/Alertness: Awake/alert Behavior During Therapy: WFL for tasks assessed/performed Overall Cognitive Status: Within Functional Limits for tasks assessed                                          General Comments      Exercises     Assessment/Plan    PT Assessment Patient needs continued PT services  PT Problem List Decreased mobility;Decreased activity tolerance;Decreased knowledge of use of DME;Pain       PT Treatment Interventions Therapeutic activities;DME instruction;Gait training;Stair training;Functional mobility training    PT Goals (Current goals can be found in the Care Plan section)  Acute Rehab PT Goals Patient Stated Goal: return home PT Goal Formulation: With patient Time For Goal Achievement: 07/12/22 Potential to Achieve Goals: Fair    Frequency Min 2X/week     Co-evaluation               AM-PAC PT "6 Clicks" Mobility  Outcome Measure Help needed turning from your back to your side while in a flat bed without using bedrails?: A Little Help needed moving from lying on your back to sitting on the side of a flat bed without using bedrails?: A Little Help needed moving to and from a bed to a chair (including a wheelchair)?: A Little Help needed standing up from a chair using your arms (e.g., wheelchair or bedside chair)?: A Little Help needed to walk in hospital room?: A Little Help needed  climbing 3-5 steps with a railing? : A Little 6 Click Score: 18    End of Session Equipment Utilized During Treatment: Gait belt Activity Tolerance: Patient tolerated treatment well Patient left: in chair;with call bell/phone within reach;with chair alarm set Nurse Communication: Mobility status PT Visit Diagnosis: Other abnormalities of gait and mobility (R26.89);Muscle weakness (generalized) (M62.81)    Time: [id number] PT Time Calculation (min) (ACUTE ONLY): 20 min   Charges:   PT Evaluation $PT Eval Moderate Complexity: 1 Mod          [place], PT Acute Rehabilitation Services Office: [phone]   Sandy Salaam Anastasya Jewell 06/28/2022, 11:11 AM

## 2022-06-28 NOTE — Consult Note (Signed)
[facility] for Infectious Disease       Reason for Consult:  bacteremia   Referring Physician: Dr. Cyndia Skeeters  Principal Problem:   Septic shock [facility]) Active Problems:   Hypothyroidism   Chronic bilateral back pain   Chronic obstructive pulmonary disease (HCC)   Essential hypertension   Gastro-esophageal reflux disease without esophagitis   High output ileostomy (HCC)   Chronic anticoagulation   PAF (paroxysmal atrial fibrillation) (HCC)   History of pulmonary embolism   History of DVT (deep vein thrombosis)   Lumbar radiculopathy, chronic   Nausea and vomiting    allopurinol  300 mg Oral QHS   apixaban  5 mg Oral BID   arformoterol  15 mcg Nebulization BID   budesonide (PULMICORT) nebulizer solution  0.5 mg Nebulization BID   Chlorhexidine Gluconate Cloth  6 each Topical Daily   feeding supplement  237 mL Oral BID BM   levothyroxine  50 mcg Oral Daily   metoprolol tartrate  25 mg Oral BID   multivitamin with minerals  1 tablet Oral Daily   pantoprazole  40 mg Oral Daily   rosuvastatin  20 mg Oral Daily    Recommendations: Continue cefepime Will monitor senstivities and adjust as indicated  Assessment: Bacteremia with Enterobacter cloacae, likely GI source.  IMproved on appropriate treatment and will continue.  I do not anticipate the need for IV antibiotics at discharge unless resistant.    HPI: Kyle Delacruz is a 67 y.o. male with a history of a colonic ileus s/p colectomy and end ileostomy, PAF, prostate cancer who came in with fever and now with bacteremia.  Initially with septic shock requiring pressor support and now with two sets of blood cultures positive with GNR, one showing Enterobacter cloacae.  Tmax 103.2.  WBC initially 15.8.  Wife at bedside.  He is feeling much better.     Review of Systems:  Constitutional: negative for fevers and chills All other systems reviewed and are negative    Past Medical History:  Diagnosis Date   Allergy     Arthritis    osteo   CTS (carpal tunnel syndrome)    DJD (degenerative joint disease)    DM2 (diabetes mellitus, type 2) (HCC)    DVT (deep venous thrombosis) (HCC)    GERD (gastroesophageal reflux disease)    Gout    History of adenomatous polyps of colon 12/29/2019   History of nuclear stress test    Myoview 11/17: EF 54, no ST changes, hypertensive blood pressure response, no ischemia, low risk   Hyperlipidemia    Hypothyroidism    Nicotine addiction    OSA (obstructive sleep apnea)    Prostatitis    Reactive airway disease    Sarcocystosis    Sinus arrhythmia    Sleep apnea    cpap- not wearing   Tinea corporis     Social History   Tobacco Use   Smoking status: Former    Packs/day: 0.50    Types: Cigarettes   Smokeless tobacco: Never   Tobacco comments:    Former smoker 05/03/22  Vaping Use   Vaping Use: Never used  Substance Use Topics   Alcohol use: Not Currently    Alcohol/week: 21.0 standard drinks of alcohol    Types: 21 Cans of beer per week    Comment: stopped drinking 05/03/22   Drug use: No    Family History  Problem Relation Age of Onset   Stroke Mother  Diabetes Father    Heart disease Father        sp CABG   Diabetes Paternal Grandmother    Colon cancer Neg Hx    Stomach cancer Neg Hx    Esophageal cancer Neg Hx    Rectal cancer Neg Hx    Prostate cancer Neg Hx     Allergies  Allergen Reactions   Bee Venom     Other reaction(s): Unknown   Lisinopril     Other reaction(s): Unknown   Metoclopramide Other (See Comments)    Other reaction(s): shaking too bad "Sweat like crazy"    Penicillins Hives    Did it involve swelling of the face/tongue/throat, SOB, or low BP? Yes Did it involve sudden or severe rash/hives, skin peeling, or any reaction on the inside of your mouth or nose? Yes Did you need to seek medical attention at a hospital or doctor's office? No When did it last happen?  childhood      If all above answers are "NO",  may proceed with cephalosporin use.     Shellfish Allergy Swelling    Physical Exam: Constitutional: in no apparent distress  Vitals:   06/28/22 1300 06/28/22 1400  BP: 116/82 128/75  Pulse: (!) 112 (!) 160  Resp: 18 (!) 22  Temp:    SpO2: 97% 100%   EYES: anicteric Respiratory: normal respiratory effort Musculoskeletal:no edema  Lab Results  Component Value Date   WBC 10.8 (H) 06/28/2022   HGB 9.4 (L) 06/28/2022   HCT 30.6 (L) 06/28/2022   MCV 85.0 06/28/2022   PLT 163 06/28/2022    Lab Results  Component Value Date   CREATININE 2.24 (H) 06/28/2022   BUN 24 (H) 06/28/2022   NA 139 06/28/2022   K 4.3 06/28/2022   CL 103 06/28/2022   CO2 24 06/28/2022    Lab Results  Component Value Date   ALT 17 06/27/2022   AST 24 06/27/2022   ALKPHOS 67 06/27/2022     Microbiology: Recent Results (from the past 240 hour(s))  Resp panel by RT-PCR (RSV, Flu A&B, Covid) Anterior Nasal Swab     Status: None   Collection Time: 06/26/22  3:09 PM   Specimen: Anterior Nasal Swab  Result Value Ref Range Status   SARS Coronavirus 2 by RT PCR NEGATIVE NEGATIVE Final    Comment: (NOTE) SARS-CoV-2 target nucleic acids are NOT DETECTED.  The SARS-CoV-2 RNA is generally detectable in upper respiratory specimens during the acute phase of infection. The lowest concentration of SARS-CoV-2 viral copies this assay can detect is 138 copies/mL. A negative result does not preclude SARS-Cov-2 infection and should not be used as the sole basis for treatment or other patient management decisions. A negative result may occur with  improper specimen collection/handling, submission of specimen other than nasopharyngeal swab, presence of viral mutation(s) within the areas targeted by this assay, and inadequate number of viral copies(<138 copies/mL). A negative result must be combined with clinical observations, patient history, and epidemiological information. The expected result is  Negative.  Fact Sheet for Patients:  EntrepreneurPulse.com.au  Fact Sheet for Healthcare Providers:  IncredibleEmployment.be  This test is no t yet approved or cleared by the [place] FDA and  has been authorized for detection and/or diagnosis of SARS-CoV-2 by FDA under an Emergency Use Authorization (EUA). This EUA will remain  in effect (meaning this test can be used) for the duration of the COVID-19 declaration under Section 564(b)(1) of the Act, 21 U.S.C.section  360bbb-3(b)(1), unless the authorization is terminated  or revoked sooner.       Influenza A by PCR NEGATIVE NEGATIVE Final   Influenza B by PCR NEGATIVE NEGATIVE Final    Comment: (NOTE) The Xpert Xpress SARS-CoV-2/FLU/RSV plus assay is intended as an aid in the diagnosis of influenza from Nasopharyngeal swab specimens and should not be used as a sole basis for treatment. Nasal washings and aspirates are unacceptable for Xpert Xpress SARS-CoV-2/FLU/RSV testing.  Fact Sheet for Patients: EntrepreneurPulse.com.au  Fact Sheet for Healthcare Providers: IncredibleEmployment.be  This test is not yet approved or cleared by the [place] FDA and has been authorized for detection and/or diagnosis of SARS-CoV-2 by FDA under an Emergency Use Authorization (EUA). This EUA will remain in effect (meaning this test can be used) for the duration of the COVID-19 declaration under Section 564(b)(1) of the Act, 21 U.S.C. section 360bbb-3(b)(1), unless the authorization is terminated or revoked.     Resp Syncytial Virus by PCR NEGATIVE NEGATIVE Final    Comment: (NOTE) Fact Sheet for Patients: EntrepreneurPulse.com.au  Fact Sheet for Healthcare Providers: IncredibleEmployment.be  This test is not yet approved or cleared by the [place] FDA and has been authorized for detection and/or diagnosis of  SARS-CoV-2 by FDA under an Emergency Use Authorization (EUA). This EUA will remain in effect (meaning this test can be used) for the duration of the COVID-19 declaration under Section 564(b)(1) of the Act, 21 U.S.C. section 360bbb-3(b)(1), unless the authorization is terminated or revoked.  Performed at [facility], [address] [place], [place] [address]   Culture, blood (Routine x 2)     Status: None (Preliminary result)   Collection Time: 06/26/22  3:15 PM   Specimen: BLOOD  Result Value Ref Range Status   Specimen Description   Final    BLOOD LEFT ANTECUBITAL Performed at [facility], [address], [place], [place] [address]    Special Requests   Final    BOTTLES DRAWN AEROBIC AND ANAEROBIC Blood Culture adequate volume Performed at [facility], [place], [place] [address]    Culture  Setup Time   Final    GRAM NEGATIVE RODS BOTTLES DRAWN AEROBIC ONLY CRITICAL RESULT CALLED TO, READ BACK BY AND VERIFIED WITH: PHARMD K. AMEND [id number] @2024$  FH Performed at [facility], [place] [address], [place], [place] [address]    Culture GRAM NEGATIVE RODS  Final   Report Status PENDING  Incomplete  Culture, blood (Routine x 2)     Status: Abnormal (Preliminary result)   Collection Time: 06/26/22  3:52 PM   Specimen: BLOOD RIGHT ARM  Result Value Ref Range Status   Specimen Description   Final    BLOOD RIGHT ARM Performed at [facility], [place] [address], [place], [place] [address]    Special Requests   Final    BOTTLES DRAWN AEROBIC AND ANAEROBIC Blood Culture adequate volume Performed at [facility], [address] [place], [place] [address]    Culture  Setup Time   Final    GRAM NEGATIVE RODS AEROBIC BOTTLE ONLY CRITICAL RESULT CALLED TO, READ BACK BY AND VERIFIED WITH: PHARMD K. AMEND [id number] @1954$  FH    Culture (A)  Final    ENTEROBACTER CLOACAE SUSCEPTIBILITIES  TO FOLLOW Performed at [facility], [place] [address], [place], [place] [address]    Report Status PENDING  Incomplete  Blood Culture ID Panel (Reflexed)  Status: Abnormal   Collection Time: 06/26/22  3:52 PM  Result Value Ref Range Status   Enterococcus faecalis NOT DETECTED NOT DETECTED Final   Enterococcus Faecium NOT DETECTED NOT DETECTED Final   Listeria monocytogenes NOT DETECTED NOT DETECTED Final   Staphylococcus species NOT DETECTED NOT DETECTED Final   Staphylococcus aureus (BCID) NOT DETECTED NOT DETECTED Final   Staphylococcus epidermidis NOT DETECTED NOT DETECTED Final   Staphylococcus lugdunensis NOT DETECTED NOT DETECTED Final   Streptococcus species NOT DETECTED NOT DETECTED Final   Streptococcus agalactiae NOT DETECTED NOT DETECTED Final   Streptococcus pneumoniae NOT DETECTED NOT DETECTED Final   Streptococcus pyogenes NOT DETECTED NOT DETECTED Final   A.calcoaceticus-baumannii NOT DETECTED NOT DETECTED Final   Bacteroides fragilis NOT DETECTED NOT DETECTED Final   Enterobacterales DETECTED (A) NOT DETECTED Final    Comment: Enterobacterales represent a large order of gram negative bacteria, not a single organism. CRITICAL RESULT CALLED TO, READ BACK BY AND VERIFIED WITH: PHARMD K. AMEND [id number] @1954$  FH    Enterobacter cloacae complex DETECTED (A) NOT DETECTED Final    Comment: CRITICAL RESULT CALLED TO, READ BACK BY AND VERIFIED WITH: PHARMD K. AMEND [id number] @1954$  FH    Escherichia coli NOT DETECTED NOT DETECTED Final   Klebsiella aerogenes NOT DETECTED NOT DETECTED Final   Klebsiella oxytoca NOT DETECTED NOT DETECTED Final   Klebsiella pneumoniae NOT DETECTED NOT DETECTED Final   Proteus species NOT DETECTED NOT DETECTED Final   Salmonella species NOT DETECTED NOT DETECTED Final   Serratia marcescens NOT DETECTED NOT DETECTED Final   Haemophilus influenzae NOT DETECTED NOT DETECTED Final   Neisseria meningitidis NOT DETECTED NOT DETECTED Final    Pseudomonas aeruginosa NOT DETECTED NOT DETECTED Final   Stenotrophomonas maltophilia NOT DETECTED NOT DETECTED Final   Candida albicans NOT DETECTED NOT DETECTED Final   Candida auris NOT DETECTED NOT DETECTED Final   Candida glabrata NOT DETECTED NOT DETECTED Final   Candida krusei NOT DETECTED NOT DETECTED Final   Candida parapsilosis NOT DETECTED NOT DETECTED Final   Candida tropicalis NOT DETECTED NOT DETECTED Final   Cryptococcus neoformans/gattii NOT DETECTED NOT DETECTED Final   CTX-M ESBL NOT DETECTED NOT DETECTED Final   Carbapenem resistance IMP NOT DETECTED NOT DETECTED Final   Carbapenem resistance KPC NOT DETECTED NOT DETECTED Final   Carbapenem resistance NDM NOT DETECTED NOT DETECTED Final   Carbapenem resist OXA 48 LIKE NOT DETECTED NOT DETECTED Final   Carbapenem resistance VIM NOT DETECTED NOT DETECTED Final    Comment: Performed at [facility], [address], [place], [place] [address]  Urine Culture     Status: Abnormal   Collection Time: 06/26/22  8:15 PM   Specimen: Urine, Clean Catch  Result Value Ref Range Status   Specimen Description   Final    URINE, CLEAN CATCH Performed at [facility], [place] [address] [place], [place] [address]    Special Requests   Final    NONE Reflexed from [id number] Performed at [facility], [address], [place], [place] [address]    Culture (A)  Final    <10,000 COLONIES/mL INSIGNIFICANT GROWTH Performed at [facility], [place] [address] [place], [place] [address]    Report Status 06/27/2022 FINAL  Final  MRSA Next Gen by PCR, Nasal     Status: None   Collection Time: 06/26/22 11:51 PM   Specimen: Nasal Mucosa; Nasal Swab  Result Value Ref Range Status  MRSA by PCR Next Gen NOT DETECTED NOT DETECTED Final    Comment: (NOTE) The GeneXpert MRSA Assay (FDA approved for NASAL specimens only), is one component of a comprehensive MRSA colonization surveillance program. It is not intended  to diagnose MRSA infection nor to guide or monitor treatment for MRSA infections. Test performance is not FDA approved in patients less than 80 years old. Performed at [facility], [place] [address] [place], [place] [address]   Gastrointestinal Panel by PCR , Stool     Status: None   Collection Time: 06/27/22 11:52 AM   Specimen: Stool  Result Value Ref Range Status   Campylobacter species NOT DETECTED NOT DETECTED Final   Plesimonas shigelloides NOT DETECTED NOT DETECTED Final   Salmonella species NOT DETECTED NOT DETECTED Final   Yersinia enterocolitica NOT DETECTED NOT DETECTED Final   Vibrio species NOT DETECTED NOT DETECTED Final   Vibrio cholerae NOT DETECTED NOT DETECTED Final   Enteroaggregative E coli (EAEC) NOT DETECTED NOT DETECTED Final   Enteropathogenic E coli (EPEC) NOT DETECTED NOT DETECTED Final   Enterotoxigenic E coli (ETEC) NOT DETECTED NOT DETECTED Final   Shiga like toxin producing E coli (STEC) NOT DETECTED NOT DETECTED Final   Shigella/Enteroinvasive E coli (EIEC) NOT DETECTED NOT DETECTED Final   Cryptosporidium NOT DETECTED NOT DETECTED Final   Cyclospora cayetanensis NOT DETECTED NOT DETECTED Final   Entamoeba histolytica NOT DETECTED NOT DETECTED Final   Giardia lamblia NOT DETECTED NOT DETECTED Final   Adenovirus F40/41 NOT DETECTED NOT DETECTED Final   Astrovirus NOT DETECTED NOT DETECTED Final   Norovirus GI/GII NOT DETECTED NOT DETECTED Final   Rotavirus A NOT DETECTED NOT DETECTED Final   Sapovirus (I, II, IV, and V) NOT DETECTED NOT DETECTED Final    Comment: Performed at [facility], [address], [place], [place] [address]    Mercie Balsley W Kiyoshi Schaab, [place] for Infectious Disease [facility] [email or web address] 06/28/2022, 2:44 PM

## 2022-06-28 NOTE — Progress Notes (Signed)
PROGRESS NOTE  Kyle Delacruz [id number] DOB: [birth year]   PCP: Prince Solian, MD  Patient is from: Home.  Lives with wife.  Uses rolling walker and cane at baseline.  DOA: 06/26/2022 LOS: 2  Chief complaints Chief Complaint  Patient presents with   Shortness of Breath     Brief Narrative / Interim history: 67 year old M with PMH of PAF, PE and DVT on Eliquis, CAD, DM-2, prostate cancer, back surgeries, colonic ileus s/p colectomy and end ileostomy, prostate cancer, hypothyroidism and GERD presented to [facility] ED with back pain, SOB, nausea, vomiting, abdominal pain, poor p.o. intake and increased ileostomy output, and admitted for septic shock and hypoxic respiratory failure.  CT abdomen and pelvis suggested gastritis and possible cystitis.  He was started on broad-spectrum antibiotics and vasopressor and admitted to ICU.  Patient WAS off vasopressor on arrival to ICU.   Patient was transferred to [facility] hospitalist service on 2/15.  Blood culture Enterobacter cloacae.  Antibiotics de-escalated.    Subjective: Seen and examined earlier this morning.  No major events overnight of this morning.  No complaints.  Ileostomy output improved.  Has chronic back pain unchanged.  Denies nausea, vomiting or abdominal pain today.  Objective: Vitals:   06/28/22 1112 06/28/22 1129 06/28/22 1132 06/28/22 1200  BP: 124/73  126/73 132/76  Pulse: (!) 123  (!) 117 (!) 116  Resp: 20  (!) 21 20  Temp:  98.4 F (36.9 C)    TempSrc:  Oral    SpO2: 95%  95% 96%  Weight:      Height:        Examination:  GENERAL: No apparent distress.  Nontoxic. HEENT: MMM.  Vision and hearing grossly intact.  NECK: Supple.  No apparent JVD.  RESP:  No IWOB.  Fair aeration bilaterally. CVS: Slightly tachycardic.  Heart sounds normal.  ABD/GI/GU: BS+. Abd soft, NTND.  Ostomy bag with normal looking stools. MSK/EXT:  Moves extremities.  Slight weakness in right leg. SKIN: no apparent skin lesion or  wound NEURO: Awake, alert and oriented appropriately.  No apparent focal neuro deficit other than slight weakness in right leg. PSYCH: Calm. Normal affect.   Procedures:  None  Microbiology summarized: [id number], influenza and RSV PCR nonreactive. Urine culture without significant growth. MRSA PCR screen nonreactive Blood culture with Enterobacter Cloace GIP nonreactive.  Assessment and plan: Principal Problem:   Septic shock ([place]) Active Problems:   High output ileostomy (HCC)   Hypothyroidism   Chronic obstructive pulmonary disease (HCC)   Essential hypertension   Gastro-esophageal reflux disease without esophagitis   Chronic bilateral back pain   Chronic anticoagulation   PAF (paroxysmal atrial fibrillation) (HCC)   History of pulmonary embolism   History of DVT (deep vein thrombosis)   Lumbar radiculopathy, chronic  Septic shock due to Enterobacter cloacae bacteremia: Likely from GI source.  Patient with history of colectomy and end ileostomy.  He presents with nausea, vomiting, abdominal pain and increased ileostomy output.  Briefly required vasopressors while in ED.  Sepsis physiology improved.  Culture data as above.  TTE without significant finding. -Continue IV cefepime and Flagyl -Follow culture speciation.  Nausea, vomiting, abdominal pain, diarrhea and poor p.o. intake: In the setting of the above.  Improved. -Treat bacteremia/sepsis as above -Appreciate input by dietitian  AKI likely due to the above and GI loss.  CT without obstruction. Recent Labs    04/16/22 0534 04/17/22 0246 04/18/22 0254 04/19/22 [id number] 04/20/22 [id number] 04/21/22 0758 06/26/22 1517 06/27/22 0014  06/27/22 0559 06/28/22 0651  BUN 9 8 13 15 15 13 $ 25* 27* 27* 24*  CREATININE 0.92 0.89 0.95 0.91 0.90 0.89 2.81* 2.35* 2.21* 2.24*  -IV LR at 100 cc an hour -Monitor urine output closely. -Monitor renal function.  Back pain w/previous lumbar/ cervical surgeries: Has chronic RLE weakness and  chronic right groin pain likely radiculopathic.  CT lumbar spine raises concern for dislodged L4-5 and possible impingement on L4 and L5 on the right.  This is the same compared to his CT lumbar spine in 03/2022. -Needs outpatient follow-up with his orthopedic surgeon   Paroxysmal A-fib: Heart rate in 110s. -Resume home metoprolol at 25 mg twice daily -Continue home Eliquis.  Hyponatremia: Resolved.   Lactic acidosis: Resolved.  Dysphagia vs preference of not liking water (does ok with other thin liquids) -seen by SLP, going for MBS, diet per SLP   Prediabetes: A1c 5.9% in 03/2022 Recent Labs  Lab 06/27/22 1928 06/27/22 2319 06/28/22 0313 06/28/22 0753 06/28/22 1128  GLUCAP 119* 126* 129* 115* 172*  -Continue current insulin regimen.   History of provoked PE/DVT.  It seems she has this twice. -Continue Eliquis.   Hypothyroidism:  -cont levothyroxine   GERD - PPI    HLD -Continue Crestor.  Increased nutrient needs Body mass index is 29.83 kg/m. Nutrition Problem: Increased nutrient needs Etiology: acute illness Signs/Symptoms: estimated needs Interventions: Ensure Enlive (each supplement provides 350kcal and 20 grams of protein), Magic cup, MVI, Refer to RD note for recommendations   DVT prophylaxis:  SCDs Start: 06/26/22 2349 apixaban (ELIQUIS) tablet 5 mg  Code Status: Full code Family Communication: None at bedside Level of care: Telemetry Medical Status is: Inpatient Remains inpatient appropriate because: Due to severe sepsis with bacteremia, AKI   Final disposition: Likely home once medically stable Consultants:  Pulmonology admitted patient  55 minutes with more than 50% spent in reviewing records, counseling patient/family and coordinating care.   Sch Meds:  Scheduled Meds:  allopurinol  300 mg Oral QHS   apixaban  5 mg Oral BID   arformoterol  15 mcg Nebulization BID   budesonide (PULMICORT) nebulizer solution  0.5 mg Nebulization BID    Chlorhexidine Gluconate Cloth  6 each Topical Daily   feeding supplement  237 mL Oral BID BM   levothyroxine  50 mcg Oral Daily   multivitamin with minerals  1 tablet Oral Daily   pantoprazole  40 mg Oral Daily   rosuvastatin  20 mg Oral Daily   Continuous Infusions:  sodium chloride Stopped (06/27/22 2209)   ceFEPime (MAXIPIME) IV Stopped (06/28/22 1105)   metronidazole Stopped (06/28/22 1138)   PRN Meds:.acetaminophen **OR** acetaminophen, albuterol, docusate sodium, ipratropium-albuterol, metoprolol tartrate, ondansetron (ZOFRAN) IV, mouth rinse, oxyCODONE, polyethylene glycol  Antimicrobials: Anti-infectives (From admission, onward)    Start     Dose/Rate Route Frequency Ordered Stop   06/27/22 1900  ceFEPIme (MAXIPIME) 2 g in sodium chloride 0.9 % 100 mL IVPB  Status:  Discontinued        2 g 200 mL/hr over 30 Minutes Intravenous Every 24 hours 06/26/22 1703 06/27/22 1012   06/27/22 1100  metroNIDAZOLE (FLAGYL) IVPB 500 mg        500 mg 100 mL/hr over 60 Minutes Intravenous Every 12 hours 06/27/22 1012     06/27/22 1100  ceFEPIme (MAXIPIME) 2 g in sodium chloride 0.9 % 100 mL IVPB        2 g 200 mL/hr over 30 Minutes Intravenous Every 12 hours  06/27/22 1012     06/26/22 1703  vancomycin variable dose per unstable renal function (pharmacist dosing)  Status:  Discontinued         Does not apply See admin instructions 06/26/22 1703 06/27/22 1012   06/26/22 1700  vancomycin (VANCOCIN) IVPB 1000 mg/200 mL premix       See Hyperspace for full Linked Orders Report.   1,000 mg 200 mL/hr over 60 Minutes Intravenous  Once 06/26/22 1539 06/26/22 1901   06/26/22 1545  ceFEPIme (MAXIPIME) 2 g in sodium chloride 0.9 % 100 mL IVPB        2 g 200 mL/hr over 30 Minutes Intravenous  Once 06/26/22 1538 06/26/22 1623   06/26/22 1545  metroNIDAZOLE (FLAGYL) IVPB 500 mg        500 mg 100 mL/hr over 60 Minutes Intravenous  Once 06/26/22 1538 06/26/22 1731   06/26/22 1545  vancomycin (VANCOCIN)  IVPB 1000 mg/200 mL premix  Status:  Discontinued        1,000 mg 200 mL/hr over 60 Minutes Intravenous  Once 06/26/22 1538 06/26/22 1539   06/26/22 1545  vancomycin (VANCOREADY) IVPB 2000 mg/400 mL  Status:  Discontinued        2,000 mg 200 mL/hr over 120 Minutes Intravenous  Once 06/26/22 1539 06/26/22 1539   06/26/22 1545  vancomycin (VANCOCIN) IVPB 1000 mg/200 mL premix       See Hyperspace for full Linked Orders Report.   1,000 mg 200 mL/hr over 60 Minutes Intravenous  Once 06/26/22 1539 06/26/22 1801        I have personally reviewed the following labs and images: CBC: Recent Labs  Lab 06/26/22 1517 06/27/22 0014 06/27/22 0559 06/28/22 0651  WBC 31.3* 21.9* 15.8* 10.8*  NEUTROABS 28.5*  --   --   --   HGB 12.1* 9.7* 10.0* 9.4*  HCT 38.9* 31.6* 32.9* 30.6*  MCV 83.8 84.5 85.0 85.0  PLT 223 160 142* 163   BMP &GFR Recent Labs  Lab 06/26/22 1517 06/27/22 0014 06/27/22 0559 06/28/22 0651  NA 133* 131* 133* 139  K 3.7 4.2 3.8 4.3  CL 95* 98 98 103  CO2 19* 22 21* 24  GLUCOSE 131* 117* 99 137*  BUN 25* 27* 27* 24*  CREATININE 2.81* 2.35* 2.21* 2.24*  CALCIUM 9.5 8.0* 8.2* 8.5*  MG  --  0.9* 1.8 3.0*  PHOS  --  3.8 3.6  --    Estimated Creatinine Clearance: 37.9 mL/min (A) (by C-G formula based on SCr of 2.24 mg/dL (H)). Liver & Pancreas: Recent Labs  Lab 06/26/22 1517 06/27/22 0014  AST 20 24  ALT 16 17  ALKPHOS 77 67  BILITOT 2.6* 2.1*  PROT 7.8 5.8*  ALBUMIN 3.8 2.3*   No results for input(s): "LIPASE", "AMYLASE" in the last 168 hours. No results for input(s): "AMMONIA" in the last 168 hours. Diabetic: No results for input(s): "HGBA1C" in the last 72 hours. Recent Labs  Lab 06/27/22 1928 06/27/22 2319 06/28/22 0313 06/28/22 0753 06/28/22 1128  GLUCAP 119* 126* 129* 115* 172*   Cardiac Enzymes: No results for input(s): "CKTOTAL", "CKMB", "CKMBINDEX", "TROPONINI" in the last 168 hours. No results for input(s): "PROBNP" in the last 8760  hours. Coagulation Profile: Recent Labs  Lab 06/26/22 1517  INR 2.6*   Thyroid Function Tests: Recent Labs    06/28/22 0220  TSH 1.749   Lipid Profile: No results for input(s): "CHOL", "HDL", "LDLCALC", "TRIG", "CHOLHDL", "LDLDIRECT" in the last 72 hours. Anemia  Panel: No results for input(s): "VITAMINB12", "FOLATE", "FERRITIN", "TIBC", "IRON", "RETICCTPCT" in the last 72 hours. Urine analysis:    Component Value Date/Time   COLORURINE YELLOW 06/26/2022 2015   APPEARANCEUR HAZY (A) 06/26/2022 2015   LABSPEC 1.007 06/26/2022 2015   PHURINE 6.0 06/26/2022 2015   GLUCOSEU NEGATIVE 06/26/2022 2015   HGBUR MODERATE (A) 06/26/2022 2015   BILIRUBINUR NEGATIVE 06/26/2022 2015   KETONESUR NEGATIVE 06/26/2022 2015   PROTEINUR 100 (A) 06/26/2022 2015   UROBILINOGEN 0.2 04/26/2014 1212   NITRITE NEGATIVE 06/26/2022 2015   LEUKOCYTESUR SMALL (A) 06/26/2022 2015   Sepsis Labs: Invalid input(s): "PROCALCITONIN", "LACTICIDVEN"  Microbiology: Recent Results (from the past 240 hour(s))  Resp panel by RT-PCR (RSV, Flu A&B, Covid) Anterior Nasal Swab     Status: None   Collection Time: 06/26/22  3:09 PM   Specimen: Anterior Nasal Swab  Result Value Ref Range Status   SARS Coronavirus 2 by RT PCR NEGATIVE NEGATIVE Final    Comment: (NOTE) SARS-CoV-2 target nucleic acids are NOT DETECTED.  The SARS-CoV-2 RNA is generally detectable in upper respiratory specimens during the acute phase of infection. The lowest concentration of SARS-CoV-2 viral copies this assay can detect is 138 copies/mL. A negative result does not preclude SARS-Cov-2 infection and should not be used as the sole basis for treatment or other patient management decisions. A negative result may occur with  improper specimen collection/handling, submission of specimen other than nasopharyngeal swab, presence of viral mutation(s) within the areas targeted by this assay, and inadequate number of viral copies(<138  copies/mL). A negative result must be combined with clinical observations, patient history, and epidemiological information. The expected result is Negative.  Fact Sheet for Patients:  EntrepreneurPulse.com.au  Fact Sheet for Healthcare Providers:  IncredibleEmployment.be  This test is no t yet approved or cleared by the [place] FDA and  has been authorized for detection and/or diagnosis of SARS-CoV-2 by FDA under an Emergency Use Authorization (EUA). This EUA will remain  in effect (meaning this test can be used) for the duration of the COVID-19 declaration under Section 564(b)(1) of the Act, 21 U.S.C.section 360bbb-3(b)(1), unless the authorization is terminated  or revoked sooner.       Influenza A by PCR NEGATIVE NEGATIVE Final   Influenza B by PCR NEGATIVE NEGATIVE Final    Comment: (NOTE) The Xpert Xpress SARS-CoV-2/FLU/RSV plus assay is intended as an aid in the diagnosis of influenza from Nasopharyngeal swab specimens and should not be used as a sole basis for treatment. Nasal washings and aspirates are unacceptable for Xpert Xpress SARS-CoV-2/FLU/RSV testing.  Fact Sheet for Patients: EntrepreneurPulse.com.au  Fact Sheet for Healthcare Providers: IncredibleEmployment.be  This test is not yet approved or cleared by the [place] FDA and has been authorized for detection and/or diagnosis of SARS-CoV-2 by FDA under an Emergency Use Authorization (EUA). This EUA will remain in effect (meaning this test can be used) for the duration of the COVID-19 declaration under Section 564(b)(1) of the Act, 21 U.S.C. section 360bbb-3(b)(1), unless the authorization is terminated or revoked.     Resp Syncytial Virus by PCR NEGATIVE NEGATIVE Final    Comment: (NOTE) Fact Sheet for Patients: EntrepreneurPulse.com.au  Fact Sheet for Healthcare  Providers: IncredibleEmployment.be  This test is not yet approved or cleared by the [place] FDA and has been authorized for detection and/or diagnosis of SARS-CoV-2 by FDA under an Emergency Use Authorization (EUA). This EUA will remain in effect (meaning this test can be used) for  the duration of the COVID-19 declaration under Section 564(b)(1) of the Act, 21 U.S.C. section 360bbb-3(b)(1), unless the authorization is terminated or revoked.  Performed at [facility], [address], [place], [place] [address]   Culture, blood (Routine x 2)     Status: None (Preliminary result)   Collection Time: 06/26/22  3:15 PM   Specimen: BLOOD  Result Value Ref Range Status   Specimen Description   Final    BLOOD LEFT ANTECUBITAL Performed at [facility], [address] [place], [place] [address]    Special Requests   Final    BOTTLES DRAWN AEROBIC AND ANAEROBIC Blood Culture adequate volume Performed at [facility], [place], [place] [address]    Culture  Setup Time   Final    GRAM NEGATIVE RODS BOTTLES DRAWN AEROBIC ONLY CRITICAL RESULT CALLED TO, READ BACK BY AND VERIFIED WITH: PHARMD K. AMEND [id number] @2024$  FH Performed at [facility], [place] [address], [place], [place] [address]    Culture GRAM NEGATIVE RODS  Final   Report Status PENDING  Incomplete  Culture, blood (Routine x 2)     Status: Abnormal (Preliminary result)   Collection Time: 06/26/22  3:52 PM   Specimen: BLOOD RIGHT ARM  Result Value Ref Range Status   Specimen Description   Final    BLOOD RIGHT ARM Performed at [facility], [place] [address] [place], [place] [address]    Special Requests   Final    BOTTLES DRAWN AEROBIC AND ANAEROBIC Blood Culture adequate volume Performed at [facility], [address] [place], [place] [address]    Culture  Setup Time   Final    GRAM NEGATIVE  RODS AEROBIC BOTTLE ONLY CRITICAL RESULT CALLED TO, READ BACK BY AND VERIFIED WITH: PHARMD K. AMEND [id number] @1954$  FH    Culture (A)  Final    ENTEROBACTER CLOACAE SUSCEPTIBILITIES TO FOLLOW Performed at [facility], [place] [address] [place],  [address]    Report Status PENDING  Incomplete  Blood Culture ID Panel (Reflexed)     Status: Abnormal   Collection Time: 06/26/22  3:52 PM  Result Value Ref Range Status   Enterococcus faecalis NOT DETECTED NOT DETECTED Final   Enterococcus Faecium NOT DETECTED NOT DETECTED Final   Listeria monocytogenes NOT DETECTED NOT DETECTED Final   Staphylococcus species NOT DETECTED NOT DETECTED Final   Staphylococcus aureus (BCID) NOT DETECTED NOT DETECTED Final   Staphylococcus epidermidis NOT DETECTED NOT DETECTED Final   Staphylococcus lugdunensis NOT DETECTED NOT DETECTED Final   Streptococcus species NOT DETECTED NOT DETECTED Final   Streptococcus agalactiae NOT DETECTED NOT DETECTED Final   Streptococcus pneumoniae NOT DETECTED NOT DETECTED Final   Streptococcus pyogenes NOT DETECTED NOT DETECTED Final   A.calcoaceticus-baumannii NOT DETECTED NOT DETECTED Final   Bacteroides fragilis NOT DETECTED NOT DETECTED Final   Enterobacterales DETECTED (A) NOT DETECTED Final    Comment: Enterobacterales represent a large order of gram negative bacteria, not a single organism. CRITICAL RESULT CALLED TO, READ BACK BY AND VERIFIED WITH: PHARMD K. AMEND [id number] @1954$  FH    Enterobacter cloacae complex DETECTED (A) NOT DETECTED Final    Comment: CRITICAL RESULT CALLED TO, READ BACK BY AND VERIFIED WITH: PHARMD K. AMEND [phone] @1954$  FH    Escherichia coli NOT DETECTED NOT DETECTED Final   Klebsiella aerogenes NOT DETECTED NOT DETECTED Final   Klebsiella oxytoca NOT DETECTED NOT DETECTED Final  Klebsiella pneumoniae NOT DETECTED NOT DETECTED Final   Proteus species NOT DETECTED NOT DETECTED Final   Salmonella species NOT DETECTED NOT DETECTED  Final   Serratia marcescens NOT DETECTED NOT DETECTED Final   Haemophilus influenzae NOT DETECTED NOT DETECTED Final   Neisseria meningitidis NOT DETECTED NOT DETECTED Final   Pseudomonas aeruginosa NOT DETECTED NOT DETECTED Final   Stenotrophomonas maltophilia NOT DETECTED NOT DETECTED Final   Candida albicans NOT DETECTED NOT DETECTED Final   Candida auris NOT DETECTED NOT DETECTED Final   Candida glabrata NOT DETECTED NOT DETECTED Final   Candida krusei NOT DETECTED NOT DETECTED Final   Candida parapsilosis NOT DETECTED NOT DETECTED Final   Candida tropicalis NOT DETECTED NOT DETECTED Final   Cryptococcus neoformans/gattii NOT DETECTED NOT DETECTED Final   CTX-M ESBL NOT DETECTED NOT DETECTED Final   Carbapenem resistance IMP NOT DETECTED NOT DETECTED Final   Carbapenem resistance KPC NOT DETECTED NOT DETECTED Final   Carbapenem resistance NDM NOT DETECTED NOT DETECTED Final   Carbapenem resist OXA 48 LIKE NOT DETECTED NOT DETECTED Final   Carbapenem resistance VIM NOT DETECTED NOT DETECTED Final    Comment: Performed at [facility], [address], [place], [place] [address]  Urine Culture     Status: Abnormal   Collection Time: 06/26/22  8:15 PM   Specimen: Urine, Clean Catch  Result Value Ref Range Status   Specimen Description   Final    URINE, CLEAN CATCH Performed at [facility], [place] [address], [place], [place] [address]    Special Requests   Final    NONE Reflexed from [id number] Performed at [facility], [address], [place], [place] [address]    Culture (A)  Final    <10,000 COLONIES/mL INSIGNIFICANT GROWTH Performed at [facility], [place] [address], [place], [place] [address]    Report Status 06/27/2022 FINAL  Final  MRSA Next Gen by PCR, Nasal     Status: None   Collection Time: 06/26/22 11:51 PM   Specimen: Nasal Mucosa; Nasal Swab  Result Value Ref Range Status   MRSA by PCR Next Gen NOT DETECTED NOT DETECTED Final     Comment: (NOTE) The GeneXpert MRSA Assay (FDA approved for NASAL specimens only), is one component of a comprehensive MRSA colonization surveillance program. It is not intended to diagnose MRSA infection nor to guide or monitor treatment for MRSA infections. Test performance is not FDA approved in patients less than 30 years old. Performed at [facility], [place] [address] [place], [place] [address]   Gastrointestinal Panel by PCR , Stool     Status: None   Collection Time: 06/27/22 11:52 AM   Specimen: Stool  Result Value Ref Range Status   Campylobacter species NOT DETECTED NOT DETECTED Final   Plesimonas shigelloides NOT DETECTED NOT DETECTED Final   Salmonella species NOT DETECTED NOT DETECTED Final   Yersinia enterocolitica NOT DETECTED NOT DETECTED Final   Vibrio species NOT DETECTED NOT DETECTED Final   Vibrio cholerae NOT DETECTED NOT DETECTED Final   Enteroaggregative E coli (EAEC) NOT DETECTED NOT DETECTED Final   Enteropathogenic E coli (EPEC) NOT DETECTED NOT DETECTED Final   Enterotoxigenic E coli (ETEC) NOT DETECTED NOT DETECTED Final   Shiga like toxin producing E coli (STEC) NOT DETECTED NOT DETECTED Final   Shigella/Enteroinvasive E coli (EIEC) NOT DETECTED NOT DETECTED Final   Cryptosporidium NOT DETECTED NOT DETECTED Final   Cyclospora cayetanensis NOT  DETECTED NOT DETECTED Final   Entamoeba histolytica NOT DETECTED NOT DETECTED Final   Giardia lamblia NOT DETECTED NOT DETECTED Final   Adenovirus F40/41 NOT DETECTED NOT DETECTED Final   Astrovirus NOT DETECTED NOT DETECTED Final   Norovirus GI/GII NOT DETECTED NOT DETECTED Final   Rotavirus A NOT DETECTED NOT DETECTED Final   Sapovirus (I, II, IV, and V) NOT DETECTED NOT DETECTED Final    Comment: Performed at [facility], [address] [place], [place] [address]    Radiology Studies: ECHOCARDIOGRAM LIMITED  Result Date: 06/27/2022    ECHOCARDIOGRAM REPORT   Patient Name:   AQEEL RELYEA [facility]  Date of Exam: 06/27/2022 Medical Rec #:  [id number]       Height:       70.0 in Accession #:    [id number]      Weight:       211.9 lb Date of Birth:  March 13, 1956       BSA:          2.139 m Patient Age:    41 years        BP:           117/60 mmHg Patient Gender: M               HR:           113 bpm. Exam Location:  Inpatient Procedure: Limited Echo, Color Doppler and Cardiac Doppler Indications:    Bacteremia  History:        Patient has prior history of Echocardiogram examinations, most                 recent 04/10/2022. Sepsis and COPD; Risk Factors:Hypertension,                 Dyslipidemia, Sleep Apnea and Former Smoker.  Sonographer:    Johny Chess RDCS Referring Phys: [id number] Candee Furbish  Sonographer Comments: Image acquisition challenging due to respiratory motion. IMPRESSIONS  1. Left ventricular ejection fraction, by estimation, is 60 to 65%. The left ventricle has normal function. The left ventricle has no regional wall motion abnormalities. Left ventricular diastolic function could not be evaluated.  2. Right ventricular systolic function is normal. The right ventricular size is normal. The estimated right ventricular systolic pressure is [id number] mmHg.  3. The mitral valve is normal in structure. No evidence of mitral valve regurgitation. No evidence of mitral stenosis.  4. The aortic valve is tricuspid. Aortic valve regurgitation is not visualized. Aortic valve sclerosis/calcification is present, without any evidence of aortic stenosis.  5. The inferior vena cava is normal in size with greater than 50% respiratory variability, suggesting right atrial pressure of 3 mmHg. Conclusion(s)/Recommendation(s): No evidence of valvular vegetations on this transthoracic echocardiogram. Consider a transesophageal echocardiogram to exclude infective endocarditis if clinically indicated. FINDINGS  Left Ventricle: Left ventricular ejection fraction, by estimation, is 60 to 65%. The left ventricle has normal  function. The left ventricle has no regional wall motion abnormalities. The left ventricular internal cavity size was normal in size. There is  no left ventricular hypertrophy. Left ventricular diastolic function could not be evaluated. Right Ventricle: The right ventricular size is normal. No increase in right ventricular wall thickness. Right ventricular systolic function is normal. The tricuspid regurgitant velocity is 2.31 m/s, and with an assumed right atrial pressure of 3 mmHg, the estimated right ventricular systolic pressure is Q000111Q mmHg. Left Atrium: Left atrial size was normal in size. Right Atrium: Right  atrial size was normal in size. Pericardium: There is no evidence of pericardial effusion. Mitral Valve: The mitral valve is normal in structure. No evidence of mitral valve regurgitation. No evidence of mitral valve stenosis. Tricuspid Valve: The tricuspid valve is normal in structure. Tricuspid valve regurgitation is trivial. No evidence of tricuspid stenosis. Aortic Valve: The aortic valve is tricuspid. Aortic valve regurgitation is not visualized. Aortic valve sclerosis/calcification is present, without any evidence of aortic stenosis. Pulmonic Valve: The pulmonic valve was normal in structure. Pulmonic valve regurgitation is not visualized. No evidence of pulmonic stenosis. Aorta: The aortic root is normal in size and structure. Venous: The inferior vena cava is normal in size with greater than 50% respiratory variability, suggesting right atrial pressure of 3 mmHg. IAS/Shunts: No atrial level shunt detected by color flow Doppler.  IVC IVC diam: 1.20 cm TRICUSPID VALVE TR Peak grad:   21.3 mmHg TR Vmax:        231.00 cm/s Fransico Him MD Electronically signed by Fransico Him MD Signature Date/Time: 06/27/2022/5:51:06 PM    Final    CT LUMBAR SPINE WO CONTRAST  Result Date: 06/27/2022 CLINICAL DATA:  Low back pain and new right lower extremity weakness. History of prior back surgery with recent  surgery in November of 2023. EXAM: CT LUMBAR SPINE WITHOUT CONTRAST TECHNIQUE: Multidetector CT imaging of the lumbar spine was performed without intravenous contrast administration. Multiplanar CT image reconstructions were also generated. RADIATION DOSE REDUCTION: This exam was performed according to the departmental dose-optimization program which includes automated exposure control, adjustment of the mA and/or kV according to patient size and/or use of iterative reconstruction technique. COMPARISON:  CT scan 04/08/2022 FINDINGS: Segmentation: There are five lumbar type vertebral bodies. The last full intervertebral disc space is labeled L5-S1. Alignment: Normal overall alignment. The facets are normally aligned. Vertebrae: No bone lesions or fractures are identified extensive postoperative changes with wide decompression laminectomies and partial facetectomies. Paraspinal and other soft tissues: No significant retroperitoneal findings. Age advanced aortic calcifications. Renal calculi noted. Disc levels: T12-L1: No significant spinal foraminal stenosis. L1-2: No disc protrusions, spinal foraminal stenosis. L2-3: Postoperative changes with posterior and interbody fusion hardware. The pedicle screws appear normal. No osseous interbody fusion changes are demonstrated yet. L3-4: Postoperative changes with posterior and interbody fusion hardware. Single right-sided interbody fusion device. Partial right-sided facetectomy. No obvious spinal foraminal stenosis. L4-5: Unfortunately, the interbody fusion device has become dislodged and is now in the right neural foramen and likely causing the patient's right radicular symptoms. The L4 pedicle screws are intact. Right-sided facetectomy noted. L5-S1: The right pedicle screw is coursing along the inferior cortex of the pedicle and is partly in the right neural foramen. Wide decompressive laminectomy at this level. No spinal stenosis. Possible impingement on right L5 nerve  root. IMPRESSION: 1. Postoperative changes with posterior and interbody fusion hardware at L2-L5. 2. The interbody fusion device at L4-5 has become dislodged and is now in the right neural foramen and likely causing the patient's right radicular symptoms. 3. The right pedicle screw at L5 is coursing along the inferior cortex of the pedicle and is partly in the right neural foramen. Possible impingement on the right L5 nerve root. 4. Aortic atherosclerosis. Aortic Atherosclerosis (ICD10-I70.0). Electronically Signed   By: Marijo Sanes M.D.   On: 06/27/2022 14:13   DG Swallowing Func-Speech Pathology  Result Date: 06/27/2022 Table formatting from the original result was not included. Modified Barium Swallow Study Patient Details Name: RIGGS BRITNELL MRN: [id number]  Date of Birth: 1955/07/26 Today's Date: 06/27/2022 HPI/PMH: HPI: Essien Bahl is a 67 yo M who presented to the ED with complaints of shortness of breath, back pain, abdominal discomfort. Admitted to ICU with sepsis.  CXR 2/13 with no active disease.  Pt with a history of GERD, hyperlipidemia, gout, diabetes, obstructive sleep apnea, DVT, status post colectomy end ileostomy. He had a posterior cervical diskectomy fusion surgery in March. Clinical Impression: Clinical Impression: Pt exhibits a primary mild pharyngoesophageal phase dysphagia exacerbated by anatomical differences. 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 This results in decreased distention and trace residue in this area that pt can sense. He had only flash laryngeal penetration of thin liquid x 1 that spontaneously exited the vestibule during the swallow. He cleared his throat several times during the study without material seen in vestibule or trachea. He tends to have a piecemeal transit pattern to his liquids taking 2, sometimes 3 swallows to clear his oral cavity possibly in anticipation of  pharyngeal clearance. There was trace vallecular and pyriform sinus residue post swallow. Esophageal scan was unremarkable. Pt educated to alternate solids and sips to facilitate pharyngoesophageal transit, ensure meats are moist (reports difficulty with steak) and small bites/sips and masticate food thoroughly. He does not report difficulty with pills therefore recommend single pills with thin. No further ST is needed. Factors that may increase risk of adverse event in presence of aspiration (Beecher 2021): No data recorded Recommendations/Plan: Swallowing Evaluation Recommendations Swallowing Evaluation Recommendations Recommendations: PO diet PO Diet Recommendation: Regular; Thin liquids (Level 0) Liquid Administration via: Cup; Straw Medication Administration: Whole meds with liquid Supervision: Patient able to self-feed Swallowing strategies  : Follow solids with liquids; Small bites/sips; Slow rate Postural changes: Position pt fully upright for meals Oral care recommendations: Oral care BID (2x/day) Treatment Plan Treatment Plan Treatment recommendations: No treatment recommended at this time Follow-up recommendations: No SLP follow up Functional status assessment: Patient has not had a recent decline in their functional status. Recommendations Recommendations for follow up therapy are one component of a multi-disciplinary discharge planning process, led by the attending physician.  Recommendations may be updated based on patient status, additional functional criteria and insurance authorization. Assessment: Orofacial Exam: Orofacial Exam Oral Cavity: Oral Hygiene: WFL Oral Cavity - Dentition: Adequate natural dentition Orofacial Anatomy: WFL Oral Motor/Sensory Function: WFL Anatomy: Anatomy: Presence of cervical hardware; Suspected cervical osteophytes (suspected cervical fusion) Thin Liquids: Thin Liquids (Level 0) Thin Liquids : Impaired Bolus delivery method: Cup; Spoon; Straw Thin Liquid -  Impairment: Oral Impairment; Pharyngeal impairment Lip Closure: No labial escape Tongue control during bolus hold: Cohesive bolus between tongue to palatal seal Bolus transport/lingual motion: Brisk tongue motion Oral residue: Trace residue lining oral structures Location of oral residue : Tongue Initiation of swallow : Valleculae Soft palate elevation: Complete Laryngeal elevation: Partial or minimal superior movement and approximation Anterior hyoid excursion: Complete Epiglottic movement: Complete Laryngeal vestibule closure: Incomplete Pharyngeal stripping wave : Present - diminished Pharyngeal contraction (A/P view only): N/A Pharyngoesophageal segment opening: Partial distention/partial duration, partial obstruction of flow Tongue base retraction: Trace column of contrast or air between tongue base and PPW Pharyngeal residue: Trace residue within or on pharyngeal structures Location of pharyngeal residue: Valleculae; Pyriform sinuses Penetration/Aspiration Scale (PAS) score: 2.  Material enters airway, remains ABOVE vocal cords then ejected out  Mildly Thick Liquids: Mildly thick liquids (Level 2, nectar thick) Mildly thick liquids (  Level 2, nectar thick): Impaired Bolus delivery method: Spoon; Cup Mildly Thick Liquid - Impairment: Oral Impairment; Pharyngeal impairment Lip Closure: No labial escape Tongue control during bolus hold: Cohesive bolus between tongue to palatal seal Bolus transport/lingual motion: Brisk tongue motion Oral residue: Residue collection on oral structures Location of oral residue : Tongue Initiation of swallow : Pyriform sinuses Soft palate elevation: Complete Laryngeal elevation: Complete superior movement of thyroid cartilage with complete approximation of arytenoids to epiglottic petiole Anterior hyoid excursion: Complete Epiglottic movement: Complete Laryngeal vestibule closure: Complete: No air/contrast in laryngeal vestibule Pharyngeal stripping wave : Present - diminished  Pharyngeal contraction (A/P view only): N/A Pharyngoesophageal segment opening: Partial distention/partial duration, partial obstruction of flow Tongue base retraction: Trace column of contrast or air between tongue base and PPW Pharyngeal residue: Trace residue within or on pharyngeal structures Location of pharyngeal residue: Valleculae; Pyriform sinuses Penetration/Aspiration Scale (PAS) score: 1.  Material does not enter airway  Moderately Thick Liquids: Moderately thick liquids (Level 3, honey thick) Moderately thick liquids (Level 3, honey thick): Impaired Bolus delivery method: Spoon Moderately Thick Liquid - Impairment: Oral Impairment; Pharyngeal impairment Lip Closure: No labial escape Tongue control during bolus hold: Cohesive bolus between tongue to palatal seal Bolus transport/lingual motion: Brisk tongue motion Oral residue: Trace residue lining oral structures Location of oral residue : Tongue Initiation of swallow : Valleculae Soft palate elevation: Complete Laryngeal elevation: Complete superior movement of thyroid cartilage with complete approximation of arytenoids to epiglottic petiole Anterior hyoid excursion: Complete Epiglottic movement: Complete Laryngeal vestibule closure: Complete: No air/contrast in laryngeal vestibule Pharyngeal stripping wave : Present - complete Pharyngeal contraction (A/P view only): N/A Pharyngoesophageal segment opening: Partial distention/partial duration, partial obstruction of flow Tongue base retraction: No contrast between tongue base and posterior pharyngeal wall (PPW) Pharyngeal residue: Trace residue within or on pharyngeal structures Location of pharyngeal residue: Valleculae; Pyriform sinuses Penetration/Aspiration Scale (PAS) score: 1.  Material does not enter airway  Puree: Puree Puree: Impaired Puree - Impairment: Oral Impairment; Pharyngeal impairment Lip Closure: No labial escape Bolus transport/lingual motion: Brisk tongue motion Oral residue: Trace  residue lining oral structures Location of oral residue : Tongue Initiation of swallow: Pyriform sinuses Soft palate elevation: Complete Laryngeal elevation: Partial or minimal superior movement and approximation Anterior hyoid excursion: Complete Epiglottic movement: Partial Laryngeal vestibule closure: Complete: No air/contrast in laryngeal vestibule Pharyngeal stripping wave : Present - diminished Pharyngeal contraction (A/P view only): N/A Pharyngoesophageal segment opening: Partial distention/partial duration, partial obstruction of flow Tongue base retraction: No contrast between tongue base and posterior pharyngeal wall (PPW) Pharyngeal residue: Trace residue within or on pharyngeal structures Location of pharyngeal residue: Aryepiglottic folds Penetration/Aspiration Scale (PAS) score: 1.  Material does not enter airway Solid: Solid Solid: Impaired Solid - Impairment: Oral Impairment; Pharyngeal impairment Lip Closure: No labial escape Bolus preparation/mastication: Timely and efficient chewing and mashing Bolus transport/lingual motion: Brisk tongue motion Oral residue: Residue collection on oral structures Location of oral residue : Tongue Initiation of swallow: Valleculae Soft palate elevation: Complete Laryngeal elevation: Complete superior movement of thyroid cartilage with complete approximation of arytenoids to epiglottic petiole Anterior hyoid excursion: Complete Epiglottic movement: Complete Laryngeal vestibule closure: Complete: No air/contrast in laryngeal vestibule Pharyngeal stripping wave : Present - diminished Pharyngeal contraction (A/P view only): N/A Pharyngoesophageal segment opening: Partial distention/partial duration, partial obstruction of flow Tongue base retraction: No contrast between tongue base and posterior pharyngeal wall (PPW) Pharyngeal residue: Complete pharyngeal clearance Penetration/Aspiration Scale (PAS) score: 1.  Material does not  enter airway Pill: Pill Pill: Not Tested  Compensatory Strategies: No data recorded  General Information: No data recorded Diet Prior to this Study: Regular; Thin liquids (Level 0)   Temperature : Normal   Respiratory Status: WFL   Supplemental O2: None (Room air)   History of Recent Intubation: No  Behavior/Cognition: Alert; Cooperative; Pleasant mood Self-Feeding Abilities: Able to self-feed Baseline vocal quality/speech: Normal Volitional Cough: Able to elicit Volitional Swallow: Able to elicit No data recorded Goal Planning: Prognosis for improved oropharyngeal function: Good No data recorded No data recorded Patient/Family Stated Goal: Comfortably drink water Consulted and agree with results and recommendations: Patient Pain: Pain Assessment Pain Assessment: Faces Pain Score: 4 Faces Pain Scale: 0 Facial Expression: 0 Body Movements: 0 Muscle Tension: 0 Compliance with ventilator (intubated pts.): N/A Vocalization (extubated pts.): 0 CPOT Total: 0 Pain Location: abdomen, low back Pain Descriptors / Indicators: Sore Pain Intervention(s): Monitored during session; Repositioned End of Session: Start Time:SLP Start Time (ACUTE ONLY): 1249 Stop Time: SLP Stop Time (ACUTE ONLY): 1302 Time Calculation:SLP Time Calculation (min) (ACUTE ONLY): 13 min Charges: SLP Evaluations $ SLP Speech Visit: 1 Visit SLP Evaluations $BSS Swallow: 1 Procedure $MBS Swallow: 1 Procedure SLP visit diagnosis: SLP Visit Diagnosis: Dysphagia, pharyngoesophageal phase (R13.14); Dysphagia, oral phase (R13.11) Past Medical History: Past Medical History: Diagnosis Date  Allergy   Arthritis   osteo  CTS (carpal tunnel syndrome)   DJD (degenerative joint disease)   DM2 (diabetes mellitus, type 2) (HCC)   DVT (deep venous thrombosis) (HCC)   GERD (gastroesophageal reflux disease)   Gout   History of adenomatous polyps of colon 12/29/2019  History of nuclear stress test   Myoview 11/17: EF 54, no ST changes, hypertensive blood pressure response, no ischemia, low risk  Hyperlipidemia    Hypothyroidism   Nicotine addiction   OSA (obstructive sleep apnea)   Prostatitis   Reactive airway disease   Sarcocystosis   Sinus arrhythmia   Sleep apnea   cpap- not wearing  Tinea corporis  Past Surgical History: Past Surgical History: Procedure Laterality Date  BILATERAL KNEE ARTHROSCOPY    right- 1997; left 2001  COLONOSCOPY  multiple last 2013  KNEE SURGERY Right 2014  torn R medial meniscus- arthroscopy done  LAPAROTOMY N/A 04/10/2022  Procedure: EXPLORATORY LAPAROTOMY, TOTAL COLECTOMY, END ILEOSTOMY;  Surgeon: Felicie Morn, MD;  Location: [facility];  Service: General;  Laterality: N/A;  PARATHYROIDECTOMY  2005  ROTATOR CUFF REPAIR  2004  right  TONSILLECTOMY    TOTAL HIP ARTHROPLASTY Left 2009  TOTAL HIP ARTHROPLASTY Right 2006  UPPER GASTROINTESTINAL ENDOSCOPY   Houston Siren 06/27/2022, 2:11 PM     [place] Mi T. Alder  If 7PM-7AM, please contact night-coverage [email or web address] 06/28/2022, 1:00 PM

## 2022-06-28 NOTE — TOC Initial Note (Signed)
Transition of Care [facility]) - Initial/Assessment Note    Patient Details  Name: Kyle Delacruz MRN: [id number] Date of Birth: 09/23/1955  [facility] CM/SW Contact:    Tom-Johnson, Renea Ee, RN Phone Number: 06/28/2022, 1:58 PM  Clinical Narrative:                  CM spoke with patient at bedside about needs for post hospital transition.  Admitted for Sepsis Bacteremia with AKI and Hypoxic Respiratory Failure. Started on IV abx and Vasopressors. Currently on room air. Has hx of PAF, PE and DVT on Eliquis, CAD, DM-2, Prostate Cancer, Colonic Ileus s/p Colectomy and end Ileostomy, (supplies from [facility]) Prostate Cancer, Hypothyroidism and GERD.   From home with wife, does not have children. Independent with care and able to drive self prior to admission. Has a cane, walker and built-in shower seat at home. PCP is [name], [name], MD [facility] [address]  Home health Resumption of care called in to [place] voiced acceptance, info on AVS.  CM will continue to follow as patient's care progresses towards discharge.              Barriers to Discharge: Continued Medical Work up   Patient Goals and CMS Choice Patient states their goals for this hospitalization and ongoing recovery are:: To return home CMS Medicare.gov Compare Post Acute Care list provided to:: Patient Choice offered to / list presented to : Patient, Spouse      Expected Discharge Plan and Services   Discharge Planning Services: CM Consult Post Acute Care Choice: [place] arrangements for the past 2 months: Single Family Home                 DME Arranged: N/A DME Agency: NA       HH Arranged: PT, RN, NA (Resumption of care) [place] Agency: [place] ([place]) Date HH Agency Contacted: 06/28/22 Time [place]: 1350 Representative spoke with at [place]: [place] Arrangements/Services Living arrangements for the  past 2 months: [place] Lives with:: Spouse Patient language and need for interpreter reviewed:: Yes Do you feel safe going back to the place where you live?: Yes      Need for Family Participation in Patient Care: Yes (Comment) Care giver support system in place?: Yes (comment) Current home services: DME (Cane, walker, built-in shower seat) Criminal Activity/Legal Involvement Pertinent to Current Situation/Hospitalization: No - Comment as needed  Activities of Daily Living      Permission Sought/Granted Permission sought to share information with : Case Manager, Customer service manager, Family Supports Permission granted to share information with : Yes, Verbal Permission Granted              Emotional Assessment   Attitude/Demeanor/Rapport: Engaged, Gracious Affect (typically observed): Accepting, Pleasant, Hopeful Orientation: : Oriented to Self, Oriented to Place, Oriented to  Time, Oriented to Situation Alcohol / Substance Use: Not Applicable    Admission diagnosis:  Acute cystitis without hematuria [N30.00] AKI (acute kidney injury) ([place]) [N17.9] Sepsis ([place]) [A41.9] Sepsis, due to unspecified organism, unspecified whether acute organ dysfunction present [facility]) [A41.9] Patient Active Problem List   Diagnosis Date Noted   History of pulmonary embolism 06/28/2022   History of DVT (deep vein thrombosis) 06/28/2022   Lumbar radiculopathy, chronic 06/28/2022   Nausea and vomiting 06/28/2022   Septic shock ([place]) 06/26/2022   SVT (supraventricular tachycardia) 06/19/2022   PAF (paroxysmal atrial  fibrillation) ([place]) 06/18/2022   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   High output ileostomy ([place]) 04/21/2022   Chronic anticoagulation 04/21/2022   Normocytic anemia 04/10/2022   Shock ([place]) 04/10/2022   Deep vein thrombosis (DVT) of femoral vein of right lower extremity ([place]) 04/09/2022   Leg DVT (deep venous thromboembolism), chronic, right ([place]) 04/08/2022    Abdominal distension 04/08/2022   Acute respiratory failure with hypoxia ([place]) 07/27/2021   PE (pulmonary thromboembolism) ([place]) 07/26/2021   Carcinoma of prostate ([place]) 07/10/2021   Chronic obstructive pulmonary disease ([place]) 07/10/2021   HLD (hyperlipidemia) 03/10/2021   Hypothyroidism 03/10/2021   Chronic bilateral back pain 03/10/2021   History of adenomatous polyps of colon 12/29/2019   Essential hypertension 06/13/2011   Obstructive sleep apnea (adult) (pediatric) 06/13/2011   Gastro-esophageal reflux disease without esophagitis 05/02/2009   PCP:  Prince Solian, MD [id number] [id number] [id number]: [phone]: [phone] [facility] [address]Phone: [phone]: [phone]    Social Determinants of Health (SDOH) Social History: [place] No Food Insecurity (04/09/2022)  Housing: Low Risk  (04/09/2022)  Transportation Needs: No Transportation Needs (04/09/2022)  Utilities: Not At Risk (04/09/2022)  Tobacco Use: Medium Risk (06/26/2022)   SDOH Interventions: Transportation Interventions: Intervention Not Indicated, Inpatient TOC, Patient Resources (Friends/Family)   Readmission Risk Interventions     No data to display

## 2022-06-29 DIAGNOSIS — R6521 Severe sepsis with septic shock: Secondary | ICD-10-CM | POA: Diagnosis not present

## 2022-06-29 DIAGNOSIS — J449 Chronic obstructive pulmonary disease, unspecified: Secondary | ICD-10-CM | POA: Diagnosis not present

## 2022-06-29 DIAGNOSIS — R198 Other specified symptoms and signs involving the digestive system and abdomen: Secondary | ICD-10-CM | POA: Diagnosis not present

## 2022-06-29 DIAGNOSIS — A419 Sepsis, unspecified organism: Secondary | ICD-10-CM | POA: Diagnosis not present

## 2022-06-29 DIAGNOSIS — I480 Paroxysmal atrial fibrillation: Secondary | ICD-10-CM | POA: Diagnosis not present

## 2022-06-29 LAB — IRON AND TIBC
Iron: 12 ug/dL — ABNORMAL LOW (ref 45–182)
Saturation Ratios: 7 % — ABNORMAL LOW (ref 17.9–39.5)
TIBC: 178 ug/dL — ABNORMAL LOW (ref 250–450)
UIBC: 166 ug/dL

## 2022-06-29 LAB — COMPREHENSIVE METABOLIC PANEL
ALT: 30 U/L (ref 0–44)
AST: 41 U/L (ref 15–41)
Albumin: 1.9 g/dL — ABNORMAL LOW (ref 3.5–5.0)
Alkaline Phosphatase: 187 U/L — ABNORMAL HIGH (ref 38–126)
Anion gap: 12 (ref 5–15)
BUN: 24 mg/dL — ABNORMAL HIGH (ref 8–23)
CO2: 21 mmol/L — ABNORMAL LOW (ref 22–32)
Calcium: 8.3 mg/dL — ABNORMAL LOW (ref 8.9–10.3)
Chloride: 101 mmol/L (ref 98–111)
Creatinine, Ser: 2.21 mg/dL — ABNORMAL HIGH (ref 0.61–1.24)
GFR, Estimated: 32 mL/min — ABNORMAL LOW (ref >60)
Glucose, Bld: 91 mg/dL (ref 70–99)
Potassium: 3.7 mmol/L (ref 3.5–5.1)
Sodium: 134 mmol/L — ABNORMAL LOW (ref 135–145)
Total Bilirubin: 2.7 mg/dL — ABNORMAL HIGH (ref 0.3–1.2)
Total Protein: 5.8 g/dL — ABNORMAL LOW (ref 6.5–8.1)

## 2022-06-29 LAB — CBC WITH DIFFERENTIAL/PLATELET
Abs Immature Granulocytes: 0.04 10*3/uL (ref 0.00–0.07)
Basophils Absolute: 0 10*3/uL (ref 0.0–0.1)
Basophils Relative: 0 %
Eosinophils Absolute: 0.1 10*3/uL (ref 0.0–0.5)
Eosinophils Relative: 1 %
HCT: 26.7 % — ABNORMAL LOW (ref 39.0–52.0)
Hemoglobin: 8.5 g/dL — ABNORMAL LOW (ref 13.0–17.0)
Immature Granulocytes: 1 %
Lymphocytes Relative: 9 %
Lymphs Abs: 0.7 10*3/uL (ref 0.7–4.0)
MCH: 27 pg (ref 26.0–34.0)
MCHC: 31.8 g/dL (ref 30.0–36.0)
MCV: 84.8 fL (ref 80.0–100.0)
Monocytes Absolute: 0.4 10*3/uL (ref 0.1–1.0)
Monocytes Relative: 6 %
Neutro Abs: 6.2 10*3/uL (ref 1.7–7.7)
Neutrophils Relative %: 83 %
Platelets: 131 10*3/uL — ABNORMAL LOW (ref 150–400)
RBC: 3.15 MIL/uL — ABNORMAL LOW (ref 4.22–5.81)
RDW: 18 % — ABNORMAL HIGH (ref 11.5–15.5)
WBC: 7.4 10*3/uL (ref 4.0–10.5)
nRBC: 0 % (ref 0.0–0.2)

## 2022-06-29 LAB — PHOSPHORUS: Phosphorus: 3.2 mg/dL (ref 2.5–4.6)

## 2022-06-29 LAB — RETICULOCYTES
Immature Retic Fract: 3.1 % (ref 2.3–15.9)
RBC.: 3.28 MIL/uL — ABNORMAL LOW (ref 4.22–5.81)
Retic Ct Pct: 0.4 % (ref 0.4–3.1)

## 2022-06-29 LAB — VITAMIN B12: Vitamin B-12: 723 pg/mL (ref 180–914)

## 2022-06-29 LAB — FOLATE: Folate: 18.2 ng/mL (ref >5.9)

## 2022-06-29 LAB — CK: Total CK: 95 U/L (ref 49–397)

## 2022-06-29 LAB — FERRITIN: Ferritin: 321 ng/mL (ref 24–336)

## 2022-06-29 LAB — MAGNESIUM: Magnesium: 2.3 mg/dL (ref 1.7–2.4)

## 2022-06-29 LAB — CULTURE, BLOOD (ROUTINE X 2): Special Requests: ADEQUATE

## 2022-06-29 MED ORDER — ALLOPURINOL 100 MG PO TABS
100.0000 mg | ORAL_TABLET | Freq: Every day | ORAL | Status: DC
  Administered 2022-06-29: 100 mg via ORAL
  Filled 2022-06-29: qty 1

## 2022-06-29 NOTE — Care Management Important Message (Signed)
Important Message  Patient Details  Name: Kyle Delacruz MRN: [id number] Date of Birth: [birth year]   Medicare Important Message Given:  Yes     Hannah Beat 06/29/2022, 11:46 AM

## 2022-06-29 NOTE — Progress Notes (Signed)
   06/29/22 1500  Mobility  Activity Ambulated with assistance in hallway  Level of Assistance Minimal assist, patient does 75% or more  Assistive Device Front wheel walker  Distance Ambulated (ft) 120 ft  Activity Response Tolerated well  Mobility Referral Yes  $Mobility charge 1 Mobility   Mobility Specialist Progress Note  Pt in bed and agreeable. Had c/o of R leg weakness. Returned to chair w/ all needs met and call bell in reach.   Lucious Groves Mobility Specialist  Please contact via SecureChat or Rehab office at [phone]

## 2022-06-29 NOTE — Progress Notes (Signed)
PROGRESS NOTE  Kyle Delacruz [id number] DOB: 03/25/1956   PCP: Prince Solian, MD  Patient is from: Home.  Lives with wife.  Uses rolling walker and cane at baseline.  DOA: 06/26/2022 LOS: 3  Chief complaints Chief Complaint  Patient presents with   Shortness of Breath     Brief Narrative / Interim history: 67 year old M with PMH of PAF, PE and DVT on Eliquis, CAD, DM-2, prostate cancer, back surgeries, colonic ileus s/p colectomy and end ileostomy, prostate cancer, hypothyroidism and GERD presented to [facility] ED with back pain, SOB, nausea, vomiting, abdominal pain, poor p.o. intake and increased ileostomy output, and admitted for septic shock and hypoxic respiratory failure.  CT abdomen and pelvis suggested gastritis and possible cystitis.  He was started on broad-spectrum antibiotics and vasopressor and admitted to ICU.  Patient WAS off vasopressor on arrival to ICU.   Patient was transferred to [facility] hospitalist service on 2/15.  Blood culture Enterobacter cloacae.  ID consulted.  Antibiotics de-escalated.    Subjective: Seen and examined earlier this morning.  No major events overnight of this morning.  No complaints.  Feels better.  Denies chest pain, shortness of breath, abdominal pain, nausea or vomiting.  Ostomy output improved.  Wife at bedside.  Objective: Vitals:   06/29/22 0603 06/29/22 0721 06/29/22 0837 06/29/22 1556  BP:  102/66  98/64  Pulse:  (!) 106 (!) 106 98  Resp:  16 15 16  $ Temp:  98.1 F (36.7 C)  98.9 F (37.2 C)  TempSrc:  Oral  Oral  SpO2:  98% 96% 95%  Weight: 97.8 kg     Height:        Examination:  GENERAL: No apparent distress.  Nontoxic. HEENT: MMM.  Vision and hearing grossly intact.  NECK: Supple.  No apparent JVD.  RESP:  No IWOB.  Fair aeration bilaterally. CVS:  RRR. Heart sounds normal.  ABD/GI/GU: BS+. Abd soft, NTND.  Ostomy with normal looking with stool. MSK/EXT:   No apparent deformity.  Slight weakness in right  leg. SKIN: no apparent skin lesion or wound NEURO: Awake and alert. Oriented appropriately.  Slight weakness in right leg. PSYCH: Calm. Normal affect.   Procedures:  None  Microbiology summarized: [id number], influenza and RSV PCR nonreactive. Urine culture without significant growth. MRSA PCR screen nonreactive Blood culture with pansensitive Enterobacter Cloace GIP nonreactive.  Assessment and plan: Principal Problem:   Septic shock ([place]) Active Problems:   High output ileostomy (HCC)   Hypothyroidism   Chronic obstructive pulmonary disease (HCC)   Essential hypertension   Gastro-esophageal reflux disease without esophagitis   Chronic bilateral back pain   Chronic anticoagulation   PAF (paroxysmal atrial fibrillation) (HCC)   History of pulmonary embolism   History of DVT (deep vein thrombosis)   Lumbar radiculopathy, chronic   Nausea and vomiting  Septic shock due to Enterobacter cloacae bacteremia: Likely from GI source.  Patient with history of colectomy and end ileostomy.  He presents with nausea, vomiting, abdominal pain and increased ileostomy output.  Briefly required vasopressors while in ED.  Sepsis physiology improved.  Culture data as above.  TTE without significant finding. -De-escalated to IV Ancef.  Can be discharged on Cipro.  Nausea, vomiting, abdominal pain, diarrhea and poor p.o. intake: In the setting of the above.  Improved. -Treat bacteremia/sepsis as above -Appreciate input by dietitian  AKI likely due to the above and GI loss.  CT without obstruction. Recent Labs    04/17/22 0246 04/18/22 0254  04/19/22 0437 04/20/22 0412 04/21/22 0758 06/26/22 1517 06/27/22 0014 06/27/22 0559 06/28/22 0651 06/29/22 0432  BUN 8 13 15 15 13 $ 25* 27* 27* 24* 24*  CREATININE 0.89 0.95 0.91 0.90 0.89 2.81* 2.35* 2.21* 2.24* 2.21*  -Discontinue IV fluid and monitor off IV fluid. -Monitor urine output closely. -Monitor renal function.  Back pain w/previous  lumbar/ cervical surgeries: Has chronic RLE weakness and chronic right groin pain likely radiculopathic.  CT lumbar spine raises concern for dislodged L4-5 and possible impingement on L4 and L5 on the right.  This is the same compared to his CT lumbar spine in 03/2022. -Needs outpatient follow-up with his orthopedic surgeon  Anemia of chronic disease: Slight drop in Hgb likely dilutional.  Overall Hgb is at baseline. Recent Labs    04/18/22 0254 04/19/22 0437 04/20/22 0412 04/21/22 0758 04/22/22 0345 06/26/22 1517 06/27/22 0014 06/27/22 0559 06/28/22 0651 06/29/22 0432  HGB 8.4* 8.5* 8.7* 8.9* 8.7* 12.1* 9.7* 10.0* 9.4* 8.5*  -Discontinue IV fluid -Recheck in the morning  Paroxysmal A-fib: Heart rate improved. -Resume home metoprolol at 25 mg twice daily -Continue home Eliquis.  Hyponatremia: Resolved.   Lactic acidosis: Resolved.  Dysphagia vs preference of not liking water (does ok with other thin liquids) -On regular diet per SLP.   Prediabetes: A1c 5.9% in 03/2022 Recent Labs  Lab 06/27/22 2319 06/28/22 0313 06/28/22 0753 06/28/22 1128 06/28/22 1532  GLUCAP 126* 129* 115* 172* 128*  -Continue current insulin regimen.   History of provoked PE/DVT.  It seems she has this twice. -Continue Eliquis.   Hypothyroidism:  -cont levothyroxine   GERD -PPI    HLD -Continue Crestor.  Increased nutrient needs Body mass index is 30.94 kg/m. Nutrition Problem: Increased nutrient needs Etiology: acute illness Signs/Symptoms: estimated needs Interventions: Ensure Enlive (each supplement provides 350kcal and 20 grams of protein), Magic cup, MVI, Refer to RD note for recommendations   DVT prophylaxis:  SCDs Start: 06/26/22 2349 apixaban (ELIQUIS) tablet 5 mg  Code Status: Full code Family Communication: None at bedside Level of care: Telemetry Medical Status is: Inpatient Remains inpatient appropriate because: Due to severe sepsis with bacteremia, AKI   Final  disposition: Likely home once medically stable Consultants:  Pulmonology admitted patient Infectious disease  35 minutes with more than 50% spent in reviewing records, counseling patient/family and coordinating care.   Sch Meds:  Scheduled Meds:  allopurinol  100 mg Oral QHS   apixaban  5 mg Oral BID   arformoterol  15 mcg Nebulization BID   budesonide (PULMICORT) nebulizer solution  0.5 mg Nebulization BID   Chlorhexidine Gluconate Cloth  6 each Topical Daily   feeding supplement  237 mL Oral BID BM   levothyroxine  50 mcg Oral Daily   metoprolol tartrate  25 mg Oral BID   multivitamin with minerals  1 tablet Oral Daily   pantoprazole  40 mg Oral Daily   rosuvastatin  20 mg Oral Daily   Continuous Infusions:  sodium chloride Stopped (06/27/22 2209)   ceFEPime (MAXIPIME) IV 2 g (06/29/22 1056)   PRN Meds:.acetaminophen **OR** acetaminophen, albuterol, docusate sodium, ipratropium-albuterol, metoprolol tartrate, ondansetron (ZOFRAN) IV, mouth rinse, oxyCODONE, polyethylene glycol  Antimicrobials: Anti-infectives (From admission, onward)    Start     Dose/Rate Route Frequency Ordered Stop   06/27/22 1900  ceFEPIme (MAXIPIME) 2 g in sodium chloride 0.9 % 100 mL IVPB  Status:  Discontinued        2 g 200 mL/hr over 30 Minutes Intravenous  Every 24 hours 06/26/22 1703 06/27/22 1012   06/27/22 1100  metroNIDAZOLE (FLAGYL) IVPB 500 mg  Status:  Discontinued        500 mg 100 mL/hr over 60 Minutes Intravenous Every 12 hours 06/27/22 1012 06/28/22 1416   06/27/22 1100  ceFEPIme (MAXIPIME) 2 g in sodium chloride 0.9 % 100 mL IVPB        2 g 200 mL/hr over 30 Minutes Intravenous Every 12 hours 06/27/22 1012     06/26/22 1703  vancomycin variable dose per unstable renal function (pharmacist dosing)  Status:  Discontinued         Does not apply See admin instructions 06/26/22 1703 06/27/22 1012   06/26/22 1700  vancomycin (VANCOCIN) IVPB 1000 mg/200 mL premix       See Hyperspace for  full Linked Orders Report.   1,000 mg 200 mL/hr over 60 Minutes Intravenous  Once 06/26/22 1539 06/26/22 1901   06/26/22 1545  ceFEPIme (MAXIPIME) 2 g in sodium chloride 0.9 % 100 mL IVPB        2 g 200 mL/hr over 30 Minutes Intravenous  Once 06/26/22 1538 06/26/22 1623   06/26/22 1545  metroNIDAZOLE (FLAGYL) IVPB 500 mg        500 mg 100 mL/hr over 60 Minutes Intravenous  Once 06/26/22 1538 06/26/22 1731   06/26/22 1545  vancomycin (VANCOCIN) IVPB 1000 mg/200 mL premix  Status:  Discontinued        1,000 mg 200 mL/hr over 60 Minutes Intravenous  Once 06/26/22 1538 06/26/22 1539   06/26/22 1545  vancomycin (VANCOREADY) IVPB 2000 mg/400 mL  Status:  Discontinued        2,000 mg 200 mL/hr over 120 Minutes Intravenous  Once 06/26/22 1539 06/26/22 1539   06/26/22 1545  vancomycin (VANCOCIN) IVPB 1000 mg/200 mL premix       See Hyperspace for full Linked Orders Report.   1,000 mg 200 mL/hr over 60 Minutes Intravenous  Once 06/26/22 1539 06/26/22 1801        I have personally reviewed the following labs and images: CBC: Recent Labs  Lab 06/26/22 1517 06/27/22 0014 06/27/22 0559 06/28/22 0651 06/29/22 0432  WBC 31.3* 21.9* 15.8* 10.8* 7.4  NEUTROABS 28.5*  --   --   --  6.2  HGB 12.1* 9.7* 10.0* 9.4* 8.5*  HCT 38.9* 31.6* 32.9* 30.6* 26.7*  MCV 83.8 84.5 85.0 85.0 84.8  PLT 223 160 142* 163 131*   BMP &GFR Recent Labs  Lab 06/26/22 1517 06/27/22 0014 06/27/22 0559 06/28/22 0651 06/29/22 0432  NA 133* 131* 133* 139 134*  K 3.7 4.2 3.8 4.3 3.7  CL 95* 98 98 103 101  CO2 19* 22 21* 24 21*  GLUCOSE 131* 117* 99 137* 91  BUN 25* 27* 27* 24* 24*  CREATININE 2.81* 2.35* 2.21* 2.24* 2.21*  CALCIUM 9.5 8.0* 8.2* 8.5* 8.3*  MG  --  0.9* 1.8 3.0* 2.3  PHOS  --  3.8 3.6  --  3.2   Estimated Creatinine Clearance: 39.1 mL/min (A) (by C-G formula based on SCr of 2.21 mg/dL (H)). Liver & Pancreas: Recent Labs  Lab 06/26/22 1517 06/27/22 0014 06/29/22 0432  AST 20 24 41   ALT 16 17 30  $ ALKPHOS 77 67 187*  BILITOT 2.6* 2.1* 2.7*  PROT 7.8 5.8* 5.8*  ALBUMIN 3.8 2.3* 1.9*   No results for input(s): "LIPASE", "AMYLASE" in the last 168 hours. No results for input(s): "AMMONIA" in the last 168  hours. Diabetic: Recent Labs    06/28/22 1313  HGBA1C 5.7*   Recent Labs  Lab 06/27/22 2319 06/28/22 0313 06/28/22 0753 06/28/22 1128 06/28/22 1532  GLUCAP 126* 129* 115* 172* 128*   Cardiac Enzymes: Recent Labs  Lab 06/29/22 0432  CKTOTAL 95   No results for input(s): "PROBNP" in the last 8760 hours. Coagulation Profile: Recent Labs  Lab 06/26/22 1517  INR 2.6*   Thyroid Function Tests: Recent Labs    06/28/22 0220  TSH 1.749   Lipid Profile: No results for input(s): "CHOL", "HDL", "LDLCALC", "TRIG", "CHOLHDL", "LDLDIRECT" in the last 72 hours. Anemia Panel: Recent Labs    06/29/22 0432  VITAMINB12 723  FOLATE 18.2  FERRITIN 321  TIBC 178*  IRON 12*  RETICCTPCT 0.4   Urine analysis:    Component Value Date/Time   COLORURINE YELLOW 06/26/2022 2015   APPEARANCEUR HAZY (A) 06/26/2022 2015   LABSPEC 1.007 06/26/2022 2015   PHURINE 6.0 06/26/2022 2015   GLUCOSEU NEGATIVE 06/26/2022 2015   HGBUR MODERATE (A) 06/26/2022 2015   BILIRUBINUR NEGATIVE 06/26/2022 2015   KETONESUR NEGATIVE 06/26/2022 2015   PROTEINUR 100 (A) 06/26/2022 2015   UROBILINOGEN 0.2 04/26/2014 1212   NITRITE NEGATIVE 06/26/2022 2015   LEUKOCYTESUR SMALL (A) 06/26/2022 2015   Sepsis Labs: Invalid input(s): "PROCALCITONIN", "LACTICIDVEN"  Microbiology: Recent Results (from the past 240 hour(s))  Resp panel by RT-PCR (RSV, Flu A&B, Covid) Anterior Nasal Swab     Status: None   Collection Time: 06/26/22  3:09 PM   Specimen: Anterior Nasal Swab  Result Value Ref Range Status   SARS Coronavirus 2 by RT PCR NEGATIVE NEGATIVE Final    Comment: (NOTE) SARS-CoV-2 target nucleic acids are NOT DETECTED.  The SARS-CoV-2 RNA is generally detectable in upper  respiratory specimens during the acute phase of infection. The lowest concentration of SARS-CoV-2 viral copies this assay can detect is 138 copies/mL. A negative result does not preclude SARS-Cov-2 infection and should not be used as the sole basis for treatment or other patient management decisions. A negative result may occur with  improper specimen collection/handling, submission of specimen other than nasopharyngeal swab, presence of viral mutation(s) within the areas targeted by this assay, and inadequate number of viral copies(<138 copies/mL). A negative result must be combined with clinical observations, patient history, and epidemiological information. The expected result is Negative.  Fact Sheet for Patients:  EntrepreneurPulse.com.au  Fact Sheet for Healthcare Providers:  IncredibleEmployment.be  This test is no t yet approved or cleared by the [place] FDA and  has been authorized for detection and/or diagnosis of SARS-CoV-2 by FDA under an Emergency Use Authorization (EUA). This EUA will remain  in effect (meaning this test can be used) for the duration of the COVID-19 declaration under Section 564(b)(1) of the Act, 21 U.S.C.section 360bbb-3(b)(1), unless the authorization is terminated  or revoked sooner.       Influenza A by PCR NEGATIVE NEGATIVE Final   Influenza B by PCR NEGATIVE NEGATIVE Final    Comment: (NOTE) The Xpert Xpress SARS-CoV-2/FLU/RSV plus assay is intended as an aid in the diagnosis of influenza from Nasopharyngeal swab specimens and should not be used as a sole basis for treatment. Nasal washings and aspirates are unacceptable for Xpert Xpress SARS-CoV-2/FLU/RSV testing.  Fact Sheet for Patients: EntrepreneurPulse.com.au  Fact Sheet for Healthcare Providers: IncredibleEmployment.be  This test is not yet approved or cleared by the [place] FDA and has been  authorized for detection and/or diagnosis of SARS-CoV-2  by FDA under an Emergency Use Authorization (EUA). This EUA will remain in effect (meaning this test can be used) for the duration of the COVID-19 declaration under Section 564(b)(1) of the Act, 21 U.S.C. section 360bbb-3(b)(1), unless the authorization is terminated or revoked.     Resp Syncytial Virus by PCR NEGATIVE NEGATIVE Final    Comment: (NOTE) Fact Sheet for Patients: EntrepreneurPulse.com.au  Fact Sheet for Healthcare Providers: IncredibleEmployment.be  This test is not yet approved or cleared by the [place] FDA and has been authorized for detection and/or diagnosis of SARS-CoV-2 by FDA under an Emergency Use Authorization (EUA). This EUA will remain in effect (meaning this test can be used) for the duration of the COVID-19 declaration under Section 564(b)(1) of the Act, 21 U.S.C. section 360bbb-3(b)(1), unless the authorization is terminated or revoked.  Performed at [facility], [address], [place], [place] [address]   Culture, blood (Routine x 2)     Status: None (Preliminary result)   Collection Time: 06/26/22  3:15 PM   Specimen: BLOOD  Result Value Ref Range Status   Specimen Description   Final    BLOOD LEFT ANTECUBITAL Performed at [facility], [address] [place], [place] [address]    Special Requests   Final    BOTTLES DRAWN AEROBIC AND ANAEROBIC Blood Culture adequate volume Performed at [facility], [place], [place] [address]    Culture  Setup Time   Final    GRAM NEGATIVE RODS BOTTLES DRAWN AEROBIC ONLY CRITICAL RESULT CALLED TO, READ BACK BY AND VERIFIED WITH: PHARMD K. AMEND [id number] @2024$  FH Performed at [facility], [place] [address], [place], [place] [address]    Culture GRAM NEGATIVE RODS  Final   Report Status PENDING  Incomplete  Culture, blood (Routine x  2)     Status: Abnormal   Collection Time: 06/26/22  3:52 PM   Specimen: BLOOD RIGHT ARM  Result Value Ref Range Status   Specimen Description   Final    BLOOD RIGHT ARM Performed at [facility], [address] [place], [place] [address]    Special Requests   Final    BOTTLES DRAWN AEROBIC AND ANAEROBIC Blood Culture adequate volume Performed at [facility], [place], [place] [address]    Culture  Setup Time   Final    GRAM NEGATIVE RODS IN BOTH AEROBIC AND ANAEROBIC BOTTLES CRITICAL RESULT CALLED TO, READ BACK BY AND VERIFIED WITH: PHARMD K. AMEND [id number] @1954$  FH Performed at [facility], [place] [address], [place], [place] [address]    Culture ENTEROBACTER CLOACAE (A)  Final   Report Status 06/29/2022 FINAL  Final   Organism ID, Bacteria ENTEROBACTER CLOACAE  Final      Susceptibility   Enterobacter cloacae - MIC*    CEFEPIME <=0.12 SENSITIVE Sensitive     CEFTAZIDIME <=1 SENSITIVE Sensitive     CIPROFLOXACIN <=0.25 SENSITIVE Sensitive     GENTAMICIN <=1 SENSITIVE Sensitive     IMIPENEM <=0.25 SENSITIVE Sensitive     TRIMETH/SULFA <=20 SENSITIVE Sensitive     PIP/TAZO <=4 SENSITIVE Sensitive     * ENTEROBACTER CLOACAE  Blood Culture ID Panel (Reflexed)     Status: Abnormal   Collection Time: 06/26/22  3:52 PM  Result Value Ref Range Status   Enterococcus faecalis NOT DETECTED NOT DETECTED Final   Enterococcus Faecium NOT DETECTED NOT DETECTED Final   Listeria monocytogenes NOT  DETECTED NOT DETECTED Final   Staphylococcus species NOT DETECTED NOT DETECTED Final   Staphylococcus aureus (BCID) NOT DETECTED NOT DETECTED Final   Staphylococcus epidermidis NOT DETECTED NOT DETECTED Final   Staphylococcus lugdunensis NOT DETECTED NOT DETECTED Final   Streptococcus species NOT DETECTED NOT DETECTED Final   Streptococcus agalactiae NOT DETECTED NOT DETECTED Final   Streptococcus pneumoniae NOT DETECTED NOT DETECTED Final    Streptococcus pyogenes NOT DETECTED NOT DETECTED Final   A.calcoaceticus-baumannii NOT DETECTED NOT DETECTED Final   Bacteroides fragilis NOT DETECTED NOT DETECTED Final   Enterobacterales DETECTED (A) NOT DETECTED Final    Comment: Enterobacterales represent a large order of gram negative bacteria, not a single organism. CRITICAL RESULT CALLED TO, READ BACK BY AND VERIFIED WITH: PHARMD K. AMEND [id number] @1954$  FH    Enterobacter cloacae complex DETECTED (A) NOT DETECTED Final    Comment: CRITICAL RESULT CALLED TO, READ BACK BY AND VERIFIED WITH: PHARMD K. AMEND [id number] @1954$  FH    Escherichia coli NOT DETECTED NOT DETECTED Final   Klebsiella aerogenes NOT DETECTED NOT DETECTED Final   Klebsiella oxytoca NOT DETECTED NOT DETECTED Final   Klebsiella pneumoniae NOT DETECTED NOT DETECTED Final   Proteus species NOT DETECTED NOT DETECTED Final   Salmonella species NOT DETECTED NOT DETECTED Final   Serratia marcescens NOT DETECTED NOT DETECTED Final   Haemophilus influenzae NOT DETECTED NOT DETECTED Final   Neisseria meningitidis NOT DETECTED NOT DETECTED Final   Pseudomonas aeruginosa NOT DETECTED NOT DETECTED Final   Stenotrophomonas maltophilia NOT DETECTED NOT DETECTED Final   Candida albicans NOT DETECTED NOT DETECTED Final   Candida auris NOT DETECTED NOT DETECTED Final   Candida glabrata NOT DETECTED NOT DETECTED Final   Candida krusei NOT DETECTED NOT DETECTED Final   Candida parapsilosis NOT DETECTED NOT DETECTED Final   Candida tropicalis NOT DETECTED NOT DETECTED Final   Cryptococcus neoformans/gattii NOT DETECTED NOT DETECTED Final   CTX-M ESBL NOT DETECTED NOT DETECTED Final   Carbapenem resistance IMP NOT DETECTED NOT DETECTED Final   Carbapenem resistance KPC NOT DETECTED NOT DETECTED Final   Carbapenem resistance NDM NOT DETECTED NOT DETECTED Final   Carbapenem resist OXA 48 LIKE NOT DETECTED NOT DETECTED Final   Carbapenem resistance VIM NOT DETECTED NOT DETECTED Final     Comment: Performed at [facility], [place] [address] [place], [place] [address]  Urine Culture     Status: Abnormal   Collection Time: 06/26/22  8:15 PM   Specimen: Urine, Clean Catch  Result Value Ref Range Status   Specimen Description   Final    URINE, CLEAN CATCH Performed at [facility], [place] [address], [place], [place] [address]    Special Requests   Final    NONE Reflexed from [id number] Performed at [facility], [address], [place], [place] [address]    Culture (A)  Final    <10,000 COLONIES/mL INSIGNIFICANT GROWTH Performed at [facility], [place] [address], [place], [place] [address]    Report Status 06/27/2022 FINAL  Final  MRSA Next Gen by PCR, Nasal     Status: None   Collection Time: 06/26/22 11:51 PM   Specimen: Nasal Mucosa; Nasal Swab  Result Value Ref Range Status   MRSA by PCR Next Gen NOT DETECTED NOT DETECTED Final    Comment: (NOTE) The GeneXpert MRSA Assay (FDA approved for NASAL specimens only), is one component of a comprehensive MRSA colonization surveillance program. It is not  intended to diagnose MRSA infection nor to guide or monitor treatment for MRSA infections. Test performance is not FDA approved in patients less than 26 years old. Performed at [facility], [place] [address] [place], [place] [address]   Gastrointestinal Panel by PCR , Stool     Status: None   Collection Time: 06/27/22 11:52 AM   Specimen: Stool  Result Value Ref Range Status   Campylobacter species NOT DETECTED NOT DETECTED Final   Plesimonas shigelloides NOT DETECTED NOT DETECTED Final   Salmonella species NOT DETECTED NOT DETECTED Final   Yersinia enterocolitica NOT DETECTED NOT DETECTED Final   Vibrio species NOT DETECTED NOT DETECTED Final   Vibrio cholerae NOT DETECTED NOT DETECTED Final   Enteroaggregative E coli (EAEC) NOT DETECTED NOT DETECTED Final   Enteropathogenic E coli (EPEC) NOT DETECTED NOT DETECTED Final    Enterotoxigenic E coli (ETEC) NOT DETECTED NOT DETECTED Final   Shiga like toxin producing E coli (STEC) NOT DETECTED NOT DETECTED Final   Shigella/Enteroinvasive E coli (EIEC) NOT DETECTED NOT DETECTED Final   Cryptosporidium NOT DETECTED NOT DETECTED Final   Cyclospora cayetanensis NOT DETECTED NOT DETECTED Final   Entamoeba histolytica NOT DETECTED NOT DETECTED Final   Giardia lamblia NOT DETECTED NOT DETECTED Final   Adenovirus F40/41 NOT DETECTED NOT DETECTED Final   Astrovirus NOT DETECTED NOT DETECTED Final   Norovirus GI/GII NOT DETECTED NOT DETECTED Final   Rotavirus A NOT DETECTED NOT DETECTED Final   Sapovirus (I, II, IV, and V) NOT DETECTED NOT DETECTED Final    Comment: Performed at [facility], [address], [place], [place] [address]    Radiology Studies: No results found.    Zygmunt Mcglinn T. [place]  If 7PM-7AM, please contact night-coverage [email or web address] 06/29/2022, 6:04 PM

## 2022-06-29 NOTE — Progress Notes (Signed)
[facility]   Reason for visit: Follow up on Bacteremia  Interval History: E cloacae pansensitive; WBC 7.4.  Walking well, no complaints. Wife at bedside   Physical Exam: Constitutional:  Vitals:   06/29/22 0721 06/29/22 0837  BP: 102/66   Pulse: (!) 106 (!) 106  Resp: 16 15  Temp: 98.1 F (36.7 C)   SpO2: 98% 96%   patient appears in NAD Respiratory: Normal respiratory effort  Review of Systems: Constitutional: negative for fevers and chills  Lab Results  Component Value Date   WBC 7.4 06/29/2022   HGB 8.5 (L) 06/29/2022   HCT 26.7 (L) 06/29/2022   MCV 84.8 06/29/2022   PLT 131 (L) 06/29/2022    Lab Results  Component Value Date   CREATININE 2.21 (H) 06/29/2022   BUN 24 (H) 06/29/2022   NA 134 (L) 06/29/2022   K 3.7 06/29/2022   CL 101 06/29/2022   CO2 21 (L) 06/29/2022    Lab Results  Component Value Date   ALT 30 06/29/2022   AST 41 06/29/2022   ALKPHOS 187 (H) 06/29/2022     Microbiology: Recent Results (from the past 240 hour(s))  Resp panel by RT-PCR (RSV, Flu A&B, Covid) Anterior Nasal Swab     Status: None   Collection Time: 06/26/22  3:09 PM   Specimen: Anterior Nasal Swab  Result Value Ref Range Status   SARS Coronavirus 2 by RT PCR NEGATIVE NEGATIVE Final    Comment: (NOTE) SARS-CoV-2 target nucleic acids are NOT DETECTED.  The SARS-CoV-2 RNA is generally detectable in upper respiratory specimens during the acute phase of infection. The lowest concentration of SARS-CoV-2 viral copies this assay can detect is 138 copies/mL. A negative result does not preclude SARS-Cov-2 infection and should not be used as the sole basis for treatment or other patient management decisions. A negative result may occur with  improper specimen collection/handling, submission of specimen other than nasopharyngeal swab, presence of viral mutation(s) within the areas targeted by this assay, and inadequate number of viral copies(<138  copies/mL). A negative result must be combined with clinical observations, patient history, and epidemiological information. The expected result is Negative.  Fact Sheet for Patients:  EntrepreneurPulse.com.au  Fact Sheet for Healthcare Providers:  IncredibleEmployment.be  This test is no t yet approved or cleared by the [place] FDA and  has been authorized for detection and/or diagnosis of SARS-CoV-2 by FDA under an Emergency Use Authorization (EUA). This EUA will remain  in effect (meaning this test can be used) for the duration of the COVID-19 declaration under Section 564(b)(1) of the Act, 21 U.S.C.section 360bbb-3(b)(1), unless the authorization is terminated  or revoked sooner.       Influenza A by PCR NEGATIVE NEGATIVE Final   Influenza B by PCR NEGATIVE NEGATIVE Final    Comment: (NOTE) The Xpert Xpress SARS-CoV-2/FLU/RSV plus assay is intended as an aid in the diagnosis of influenza from Nasopharyngeal swab specimens and should not be used as a sole basis for treatment. Nasal washings and aspirates are unacceptable for Xpert Xpress SARS-CoV-2/FLU/RSV testing.  Fact Sheet for Patients: EntrepreneurPulse.com.au  Fact Sheet for Healthcare Providers: IncredibleEmployment.be  This test is not yet approved or cleared by the [place] FDA and has been authorized for detection and/or diagnosis of SARS-CoV-2 by FDA under an Emergency Use Authorization (EUA). This EUA will remain in effect (meaning this test can be used) for the duration of the COVID-19 declaration under Section 564(b)(1) of  the Act, 21 U.S.C. section 360bbb-3(b)(1), unless the authorization is terminated or revoked.     Resp Syncytial Virus by PCR NEGATIVE NEGATIVE Final    Comment: (NOTE) Fact Sheet for Patients: EntrepreneurPulse.com.au  Fact Sheet for Healthcare  Providers: IncredibleEmployment.be  This test is not yet approved or cleared by the [place] FDA and has been authorized for detection and/or diagnosis of SARS-CoV-2 by FDA under an Emergency Use Authorization (EUA). This EUA will remain in effect (meaning this test can be used) for the duration of the COVID-19 declaration under Section 564(b)(1) of the Act, 21 U.S.C. section 360bbb-3(b)(1), unless the authorization is terminated or revoked.  Performed at [facility], [address], [place], [place] [address]   Culture, blood (Routine x 2)     Status: None (Preliminary result)   Collection Time: 06/26/22  3:15 PM   Specimen: BLOOD  Result Value Ref Range Status   Specimen Description   Final    BLOOD LEFT ANTECUBITAL Performed at [facility], [address], [place], [place] [address]    Special Requests   Final    BOTTLES DRAWN AEROBIC AND ANAEROBIC Blood Culture adequate volume Performed at [facility], [place], [place] [address]    Culture  Setup Time   Final    GRAM NEGATIVE RODS BOTTLES DRAWN AEROBIC ONLY CRITICAL RESULT CALLED TO, READ BACK BY AND VERIFIED WITH: PHARMD K. AMEND [id number]:[id number] @2024$  FH Performed at [facility], [place] [address], [place], [place] [address]    Culture GRAM NEGATIVE RODS  Final   Report Status PENDING  Incomplete  Culture, blood (Routine x 2)     Status: Abnormal   Collection Time: 06/26/22  3:52 PM   Specimen: BLOOD RIGHT ARM  Result Value Ref Range Status   Specimen Description   Final    BLOOD RIGHT ARM Performed at [facility], [address] [place], [place] [address]    Special Requests   Final    BOTTLES DRAWN AEROBIC AND ANAEROBIC Blood Culture adequate volume Performed at [facility], [place], [place] [address]    Culture  Setup Time   Final    GRAM NEGATIVE RODS IN BOTH AEROBIC AND  ANAEROBIC BOTTLES CRITICAL RESULT CALLED TO, READ BACK BY AND VERIFIED WITH: PHARMD K. AMEND [id number] @1954$  FH Performed at [facility], [place] [address] [place], [place] [address]    Culture ENTEROBACTER CLOACAE (A)  Final   Report Status 06/29/2022 FINAL  Final   Organism ID, Bacteria ENTEROBACTER CLOACAE  Final      Susceptibility   Enterobacter cloacae - MIC*    CEFEPIME <=0.12 SENSITIVE Sensitive     CEFTAZIDIME <=1 SENSITIVE Sensitive     CIPROFLOXACIN <=0.25 SENSITIVE Sensitive     GENTAMICIN <=1 SENSITIVE Sensitive     IMIPENEM <=0.25 SENSITIVE Sensitive     TRIMETH/SULFA <=20 SENSITIVE Sensitive     PIP/TAZO <=4 SENSITIVE Sensitive     * ENTEROBACTER CLOACAE  Blood Culture ID Panel (Reflexed)     Status: Abnormal   Collection Time: 06/26/22  3:52 PM  Result Value Ref Range Status   Enterococcus faecalis NOT DETECTED NOT DETECTED Final   Enterococcus Faecium NOT DETECTED NOT DETECTED Final   Listeria monocytogenes NOT DETECTED NOT DETECTED Final   Staphylococcus species NOT DETECTED NOT DETECTED Final   Staphylococcus aureus (BCID) NOT DETECTED NOT DETECTED Final   Staphylococcus epidermidis NOT DETECTED NOT  DETECTED Final   Staphylococcus lugdunensis NOT DETECTED NOT DETECTED Final   Streptococcus species NOT DETECTED NOT DETECTED Final   Streptococcus agalactiae NOT DETECTED NOT DETECTED Final   Streptococcus pneumoniae NOT DETECTED NOT DETECTED Final   Streptococcus pyogenes NOT DETECTED NOT DETECTED Final   A.calcoaceticus-baumannii NOT DETECTED NOT DETECTED Final   Bacteroides fragilis NOT DETECTED NOT DETECTED Final   Enterobacterales DETECTED (A) NOT DETECTED Final    Comment: Enterobacterales represent a large order of gram negative bacteria, not a single organism. CRITICAL RESULT CALLED TO, READ BACK BY AND VERIFIED WITH: PHARMD K. AMEND [id number] @1954$  FH    Enterobacter cloacae complex DETECTED (A) NOT DETECTED Final    Comment: CRITICAL RESULT CALLED TO,  READ BACK BY AND VERIFIED WITH: PHARMD K. AMEND [id number] @1954$  FH    Escherichia coli NOT DETECTED NOT DETECTED Final   Klebsiella aerogenes NOT DETECTED NOT DETECTED Final   Klebsiella oxytoca NOT DETECTED NOT DETECTED Final   Klebsiella pneumoniae NOT DETECTED NOT DETECTED Final   Proteus species NOT DETECTED NOT DETECTED Final   Salmonella species NOT DETECTED NOT DETECTED Final   Serratia marcescens NOT DETECTED NOT DETECTED Final   Haemophilus influenzae NOT DETECTED NOT DETECTED Final   Neisseria meningitidis NOT DETECTED NOT DETECTED Final   Pseudomonas aeruginosa NOT DETECTED NOT DETECTED Final   Stenotrophomonas maltophilia NOT DETECTED NOT DETECTED Final   Candida albicans NOT DETECTED NOT DETECTED Final   Candida auris NOT DETECTED NOT DETECTED Final   Candida glabrata NOT DETECTED NOT DETECTED Final   Candida krusei NOT DETECTED NOT DETECTED Final   Candida parapsilosis NOT DETECTED NOT DETECTED Final   Candida tropicalis NOT DETECTED NOT DETECTED Final   Cryptococcus neoformans/gattii NOT DETECTED NOT DETECTED Final   CTX-M ESBL NOT DETECTED NOT DETECTED Final   Carbapenem resistance IMP NOT DETECTED NOT DETECTED Final   Carbapenem resistance KPC NOT DETECTED NOT DETECTED Final   Carbapenem resistance NDM NOT DETECTED NOT DETECTED Final   Carbapenem resist OXA 48 LIKE NOT DETECTED NOT DETECTED Final   Carbapenem resistance VIM NOT DETECTED NOT DETECTED Final    Comment: Performed at [facility], [place] [address] [place], [place] [address]  Urine Culture     Status: Abnormal   Collection Time: 06/26/22  8:15 PM   Specimen: Urine, Clean Catch  Result Value Ref Range Status   Specimen Description   Final    URINE, CLEAN CATCH Performed at [facility], [place] [address], [place], [place] [address]    Special Requests   Final    NONE Reflexed from [phone] Performed at [facility], [address] [place], [place] [address]    Culture (A)   Final    <10,000 COLONIES/mL INSIGNIFICANT GROWTH Performed at [facility], [place] [address] [place], [place] [address]    Report Status 06/27/2022 FINAL  Final  MRSA Next Gen by PCR, Nasal     Status: None   Collection Time: 06/26/22 11:51 PM   Specimen: Nasal Mucosa; Nasal Swab  Result Value Ref Range Status   MRSA by PCR Next Gen NOT DETECTED NOT DETECTED Final    Comment: (NOTE) The GeneXpert MRSA Assay (FDA approved for NASAL specimens only), is one component of a comprehensive MRSA colonization surveillance program. It is not intended to diagnose MRSA infection nor to guide or monitor treatment for MRSA infections. Test performance is not FDA approved in patients less than 40 years old. Performed at [facility], [place] [address] [place], [place] [address]   Gastrointestinal Panel by PCR , Stool     Status: None   Collection Time: 06/27/22 11:52 AM   Specimen: Stool  Result Value Ref Range Status   Campylobacter species NOT DETECTED NOT DETECTED Final   Plesimonas shigelloides NOT DETECTED NOT DETECTED Final   Salmonella species NOT DETECTED NOT DETECTED Final   Yersinia enterocolitica NOT DETECTED NOT DETECTED Final   Vibrio species NOT DETECTED NOT DETECTED Final   Vibrio cholerae NOT DETECTED NOT DETECTED Final   Enteroaggregative E coli (EAEC) NOT DETECTED NOT DETECTED Final   Enteropathogenic E coli (EPEC) NOT DETECTED NOT DETECTED Final   Enterotoxigenic E coli (ETEC) NOT DETECTED NOT DETECTED Final   Shiga like toxin producing E coli (STEC) NOT DETECTED NOT DETECTED Final   Shigella/Enteroinvasive E coli (EIEC) NOT DETECTED NOT DETECTED Final   Cryptosporidium NOT DETECTED NOT DETECTED Final   Cyclospora cayetanensis NOT DETECTED NOT DETECTED Final   Entamoeba histolytica NOT DETECTED NOT DETECTED Final   Giardia lamblia NOT DETECTED NOT DETECTED Final   Adenovirus F40/41 NOT DETECTED NOT DETECTED Final   Astrovirus NOT DETECTED NOT DETECTED Final    Norovirus GI/GII NOT DETECTED NOT DETECTED Final   Rotavirus A NOT DETECTED NOT DETECTED Final   Sapovirus (I, II, IV, and V) NOT DETECTED NOT DETECTED Final    Comment: Performed at [facility], [address], [place], [place] [address]    Impression/Plan:  1. Bacteremia - GI vs urinary source, most likely.  Recovering well with no concerns.  At this point, he should continue for 7 days total antibiotics through 2/19 and then stop. Can be discharged from ID standpoint on oral cipro, renally dosed.    I will otherwise sign off, call with questions.  I have personally spent 35 minutes involved in face-to-face and non-face-to-face activities for this patient on the day of the visit. Professional time spent includes the following activities: Preparing to see the patient (review of tests), Obtaining and/or reviewing separately obtained history (admission/discharge record), Performing a medically appropriate examination and/or evaluation , Ordering medications/tests/procedures, referring and communicating with other health care professionals, Documenting clinical information in the EMR, Independently interpreting results (not separately reported), Communicating results to the patient/family/caregiver, Counseling and educating the patient/family/caregiver and Care coordination (not separately reported).

## 2022-06-30 ENCOUNTER — Inpatient Hospital Stay (HOSPITAL_COMMUNITY): Payer: Medicare Other

## 2022-06-30 DIAGNOSIS — A419 Sepsis, unspecified organism: Secondary | ICD-10-CM | POA: Diagnosis not present

## 2022-06-30 DIAGNOSIS — Z7901 Long term (current) use of anticoagulants: Secondary | ICD-10-CM

## 2022-06-30 DIAGNOSIS — J449 Chronic obstructive pulmonary disease, unspecified: Secondary | ICD-10-CM | POA: Diagnosis not present

## 2022-06-30 DIAGNOSIS — K219 Gastro-esophageal reflux disease without esophagitis: Secondary | ICD-10-CM | POA: Diagnosis not present

## 2022-06-30 DIAGNOSIS — I10 Essential (primary) hypertension: Secondary | ICD-10-CM | POA: Diagnosis not present

## 2022-06-30 LAB — RENAL FUNCTION PANEL
Albumin: 1.9 g/dL — ABNORMAL LOW (ref 3.5–5.0)
Anion gap: 10 (ref 5–15)
BUN: 23 mg/dL (ref 8–23)
CO2: 22 mmol/L (ref 22–32)
Calcium: 8.4 mg/dL — ABNORMAL LOW (ref 8.9–10.3)
Chloride: 104 mmol/L (ref 98–111)
Creatinine, Ser: 2.38 mg/dL — ABNORMAL HIGH (ref 0.61–1.24)
GFR, Estimated: 30 mL/min — ABNORMAL LOW (ref >60)
Glucose, Bld: 100 mg/dL — ABNORMAL HIGH (ref 70–99)
Phosphorus: 4 mg/dL (ref 2.5–4.6)
Potassium: 3.8 mmol/L (ref 3.5–5.1)
Sodium: 136 mmol/L (ref 135–145)

## 2022-06-30 LAB — CBC
HCT: 26.9 % — ABNORMAL LOW (ref 39.0–52.0)
Hemoglobin: 8.2 g/dL — ABNORMAL LOW (ref 13.0–17.0)
MCH: 26.1 pg (ref 26.0–34.0)
MCHC: 30.5 g/dL (ref 30.0–36.0)
MCV: 85.7 fL (ref 80.0–100.0)
Platelets: 133 10*3/uL — ABNORMAL LOW (ref 150–400)
RBC: 3.14 MIL/uL — ABNORMAL LOW (ref 4.22–5.81)
RDW: 18.4 % — ABNORMAL HIGH (ref 11.5–15.5)
WBC: 7.1 10*3/uL (ref 4.0–10.5)
nRBC: 0 % (ref 0.0–0.2)

## 2022-06-30 LAB — MAGNESIUM: Magnesium: 2.3 mg/dL (ref 1.7–2.4)

## 2022-06-30 MED ORDER — CIPROFLOXACIN HCL 500 MG PO TABS: 500.0000 mg | ORAL_TABLET | Freq: Two times a day (BID) | ORAL | Status: DC

## 2022-06-30 MED ORDER — ADULT MULTIVITAMIN W/MINERALS CH: 1.0000 | ORAL_TABLET | Freq: Every day | ORAL | Status: AC

## 2022-06-30 MED ORDER — ALLOPURINOL 100 MG PO TABS: 100.0000 mg | ORAL_TABLET | Freq: Every day | ORAL | 0 refills | Status: DC

## 2022-06-30 MED ORDER — CIPROFLOXACIN HCL 500 MG PO TABS
500.0000 mg | ORAL_TABLET | Freq: Two times a day (BID) | ORAL | Status: DC
  Administered 2022-06-30: 500 mg via ORAL
  Filled 2022-06-30: qty 1

## 2022-06-30 MED ORDER — CIPROFLOXACIN HCL 500 MG PO TABS: 500.0000 mg | ORAL_TABLET | Freq: Two times a day (BID) | ORAL | 0 refills | Status: AC

## 2022-06-30 NOTE — Discharge Summary (Signed)
Physician Discharge Summary  Kyle Delacruz [facility] [id number] DOB: [birth year] DOA: 06/26/2022  PCP: Prince Solian, MD  Admit date: 06/26/2022 Discharge date: 06/30/2022 Admitted From: Home Disposition: Home Recommendations for Outpatient Follow-up:  Follow up with PCP in 1 week. Check CMP and CBC at follow-up Consider referral to nephrology if renal function does not improve Please follow up on the following pending results: None  Home Health: PT/RN Equipment/Devices: None Discharge Condition: Stable CODE STATUS: Full code  Follow-up [place] [place] Follow up.   Why: Someone will call you to schedule first home visit. If you have not received a call after two days of discharging home, call their number listed. If no one comes to assess, call Case Manager at [phone]. Contact information: [place] [address] [phone]         Prince Solian, MD. Schedule an appointment as soon as possible for a visit in 1 week(s).   Specialty: Internal Medicine Contact information: [place] [address] [phone]                 Hospital course 67 year old M with PMH of PAF, PE and DVT on Eliquis, CAD, DM-2, prostate cancer, back surgeries, colonic ileus s/p colectomy and end ileostomy, prostate cancer, hypothyroidism and GERD presented to [facility] ED with back pain, SOB, nausea, vomiting, abdominal pain, poor p.o. intake and increased ileostomy output, and admitted for septic shock and hypoxic respiratory failure.  CT abdomen and pelvis suggested gastritis and possible cystitis.  He was started on broad-spectrum antibiotics and vasopressor and admitted to ICU.  Patient WAS off vasopressor on arrival to ICU.    Patient was transferred to [facility] hospitalist service on 2/15.  Blood culture Enterobacter cloacae.  ID consulted.  Antibiotics de-escalated to ciprofloxacin based on culture  sensitivity and ID recommendation.  He is discharged on p.o. Cipro for 4 more days to complete a total of 7 days course.  In regards to AKI, creatinine improved but not quite close to recent baseline.  Renal ultrasound without acute finding or obstruction.  Recommend repeat renal function at follow-up.  If no further improvement or worse, recommend referral to nephrology outpatient.  Renally dose medications.  See individual problem list below for more.   Problems addressed during this hospitalization Principal Problem:   Septic shock ([place]) Active Problems:   High output ileostomy (HCC)   Hypothyroidism   Chronic obstructive pulmonary disease (HCC)   Essential hypertension   Gastro-esophageal reflux disease without esophagitis   Chronic bilateral back pain   Chronic anticoagulation   PAF (paroxysmal atrial fibrillation) (HCC)   History of pulmonary embolism   History of DVT (deep vein thrombosis)   Lumbar radiculopathy, chronic   Nausea and vomiting   Septic shock due to Enterobacter cloacae bacteremia: Likely from GI source.  Presents with nausea, vomiting, abdominal pain and increased ileostomy output. Patient with history of colectomy and end ileostomy.  CT abdomen and pelvis as above.   Briefly required vasopressors while in ED.  Sepsis physiology resolved. TTE without significant finding. -Discharged on p.o. Cipro for 4 more days to complete treatment course   Nausea, vomiting, abdominal pain, diarrhea and poor p.o. intake: In the setting of the above.  Resolved.   AKI: Cr 2.81 (0.9 in 04/2022) and improved to 2.38.  AKI likely due to the above.  CT abdomen and pelvis and renal US without obstruction.  -Recommend repeat renal function in  1 week.  If no improvement or worse, consider referral to nephrology -Advised to avoid nephrotoxic meds and NSAIDs. -Renally dose medications.  Decreased his allopurinol.   Back pain w/previous lumbar/ cervical surgeries: Has chronic RLE  weakness and chronic right groin pain likely radiculopathic.  CT lumbar spine raises concern for dislodged L4-5 and possible impingement on L4 and L5 on the right.  This is the same compared to his CT lumbar spine in 03/2022. -Needs outpatient follow-up with his orthopedic surgeon   Anemia of chronic disease: Slight drop in Hgb likely dilutional.  Overall Hgb is at baseline.   Paroxysmal A-fib: Heart rate improved. -Continue home metoprolol and Eliquis   Hyponatremia: Resolved.   Lactic acidosis: Resolved.   Dysphagia vs preference of not liking water (does ok with other thin liquids) -On regular diet per SLP.   Prediabetes: A1c 5.9% in 03/2022   History of provoked PE/DVT.  It seems she has this twice. -Continue Eliquis.   Hypothyroidism:  -Continue home levothyroxine   GERD -PPI    HLD -Continue Crestor.   Increased nutrient needs Nutrition Problem: Increased nutrient needs Etiology: acute illness Signs/Symptoms: estimated needs Interventions: Ensure Enlive (each supplement provides 350kcal and 20 grams of protein), Magic cup, MVI, Refer to RD note for recommendations     Vital signs Vitals:   06/29/22 1556 06/29/22 1959 06/30/22 0500 06/30/22 0820  BP: 98/64 108/69  103/65  Pulse: 98 99  95  Temp: 98.9 F (37.2 C) 98.4 F (36.9 C)  98 F (36.7 C)  Resp: 16 18  16  $ Height:      Weight:   98.2 kg   SpO2: 95% 95%  95%  TempSrc: Oral   Oral  BMI (Calculated):   31.06      Discharge exam  GENERAL: No apparent distress.  Nontoxic. HEENT: MMM.  Vision and hearing grossly intact.  NECK: Supple.  No apparent JVD.  RESP:  No IWOB.  Fair aeration bilaterally. CVS:  RRR. Heart sounds normal.  ABD/GI/GU: BS+. Abd soft, NTND.  Ostomy with normal looking stool. MSK/EXT:  Moves extremities. No apparent deformity.  Some RLE weakness (chronic). SKIN: no apparent skin lesion or wound NEURO: Awake and alert. Oriented appropriately.  Some RLE weakness PSYCH: Calm.  Normal affect.   Discharge Instructions Discharge Instructions     Call MD for:  difficulty breathing, headache or visual disturbances   Complete by: As directed    Call MD for:  extreme fatigue   Complete by: As directed    Call MD for:  persistant nausea and vomiting   Complete by: As directed    Diet - low sodium heart healthy   Complete by: As directed    Discharge instructions   Complete by: As directed    It has been a pleasure taking care of you!  You were hospitalized due to bloodstream infection and acute kidney injury.  You have been started on antibiotic for infection.  We are discharging you on more antibiotics to complete treatment course.  Your kidney function has improved some.  Your primary care doctor can recheck your kidney numbers in about a week and refer you to a nephrologist if needed.  Avoid any over-the-counter pain medication other than plain Tylenol.  Keep yourself hydrated.  Review your new medication list and the directions on your medications before you take them.   Take care,   Increase activity slowly   Complete by: As directed  Allergies as of 06/30/2022       Reactions   Bee Venom    Other reaction(s): Unknown   Lisinopril    Other reaction(s): Unknown   Metoclopramide Other (See Comments)   Other reaction(s): shaking too bad "Sweat like crazy"   Penicillins Hives   Did it involve swelling of the face/tongue/throat, SOB, or low BP? Yes Did it involve sudden or severe rash/hives, skin peeling, or any reaction on the inside of your mouth or nose? Yes Did you need to seek medical attention at a hospital or doctor's office? No When did it last happen?  childhood      If all above answers are "NO", may proceed with cephalosporin use.     Shellfish Allergy Swelling        Medication List     TAKE these medications    albuterol 108 (90 Base) MCG/ACT inhaler Commonly known as: VENTOLIN HFA Inhale 2 puffs into the lungs every 6 (six)  hours as needed for wheezing or shortness of breath.   Align 4 MG Caps Take 1 capsule by mouth daily.   allopurinol 100 MG tablet Commonly known as: ZYLOPRIM Take 1 tablet (100 mg total) by mouth at bedtime. What changed:  medication strength how much to take   apixaban 5 MG Tabs tablet Commonly known as: ELIQUIS Take 1 tablet (5 mg total) by mouth 2 (two) times daily.   ciprofloxacin 500 MG tablet Commonly known as: CIPRO Take 1 tablet (500 mg total) by mouth 2 (two) times daily for 4 days.   Crestor 20 MG tablet Generic drug: rosuvastatin Take 20 mg by mouth daily.   fluticasone 50 MCG/ACT nasal spray Commonly known as: FLONASE INSTILL 2 SPRAYS IN EACH NOSTRIL DAILY   levothyroxine 50 MCG tablet Commonly known as: SYNTHROID Take 50 mcg by mouth daily.   loperamide 2 MG capsule Commonly known as: IMODIUM Take 2 capsules (4 mg total) by mouth every 8 (eight) hours as needed for diarrhea or loose stools.   metoprolol tartrate 50 MG tablet Commonly known as: LOPRESSOR Take 1 tablet (50 mg total) by mouth 2 (two) times daily.   multivitamin with minerals Tabs tablet Take 1 tablet by mouth daily. Start taking on: July 01, 2022   omeprazole 40 MG capsule Commonly known as: PRILOSEC Take 40 mg by mouth 2 (two) times daily.   oxyCODONE 5 MG immediate release tablet Commonly known as: Oxy IR/ROXICODONE Take 1 tablet (5 mg total) by mouth every 6 (six) hours as needed for moderate pain, severe pain or breakthrough pain.        Consultations: Pulmonology admitted patient  Procedures/Studies:   US RENAL  Result Date: 06/30/2022 CLINICAL DATA:  Acute kidney injury EXAM: RENAL / URINARY TRACT ULTRASOUND COMPLETE COMPARISON:  CT 06/26/2022 FINDINGS: Right Kidney: Renal measurements: 13.8 x 7.5 x 6 cm = volume: 321 mL. Echogenicity within normal limits. No mass or hydronephrosis visualized. Left Kidney: Renal measurements: 11.2 x 6.2 x 5.3 cm = volume: 190 mL.  Echogenicity within normal limits. No mass or hydronephrosis visualized. Bladder: Appears normal for degree of bladder distention. Other: None. IMPRESSION: Negative. No hydronephrosis. Electronically Signed   By: Lucrezia Europe M.D.   On: 06/30/2022 09:07   ECHOCARDIOGRAM LIMITED  Result Date: 06/27/2022    ECHOCARDIOGRAM REPORT   Patient Name:   AVEL CUBBISON [facility] Date of Exam: 06/27/2022 Medical Rec #:  [id number]       Height:       70.0  in Accession #:    [id number]      Weight:       211.9 lb Date of Birth:  Jan 21, 1956       BSA:          2.139 m Patient Age:    67 years        BP:           117/60 mmHg Patient Gender: M               HR:           113 bpm. Exam Location:  Inpatient Procedure: Limited Echo, Color Doppler and Cardiac Doppler Indications:    Bacteremia  History:        Patient has prior history of Echocardiogram examinations, most                 recent 04/10/2022. Sepsis and COPD; Risk Factors:Hypertension,                 Dyslipidemia, Sleep Apnea and Former Smoker.  Sonographer:    Johny Chess RDCS Referring Phys: [id number] Candee Furbish  Sonographer Comments: Image acquisition challenging due to respiratory motion. IMPRESSIONS  1. Left ventricular ejection fraction, by estimation, is 60 to 65%. The left ventricle has normal function. The left ventricle has no regional wall motion abnormalities. Left ventricular diastolic function could not be evaluated.  2. Right ventricular systolic function is normal. The right ventricular size is normal. The estimated right ventricular systolic pressure is Q000111Q mmHg.  3. The mitral valve is normal in structure. No evidence of mitral valve regurgitation. No evidence of mitral stenosis.  4. The aortic valve is tricuspid. Aortic valve regurgitation is not visualized. Aortic valve sclerosis/calcification is present, without any evidence of aortic stenosis.  5. The inferior vena cava is normal in size with greater than 50% respiratory variability, suggesting  right atrial pressure of 3 mmHg. Conclusion(s)/Recommendation(s): No evidence of valvular vegetations on this transthoracic echocardiogram. Consider a transesophageal echocardiogram to exclude infective endocarditis if clinically indicated. FINDINGS  Left Ventricle: Left ventricular ejection fraction, by estimation, is 60 to 65%. The left ventricle has normal function. The left ventricle has no regional wall motion abnormalities. The left ventricular internal cavity size was normal in size. There is  no left ventricular hypertrophy. Left ventricular diastolic function could not be evaluated. Right Ventricle: The right ventricular size is normal. No increase in right ventricular wall thickness. Right ventricular systolic function is normal. The tricuspid regurgitant velocity is 2.31 m/s, and with an assumed right atrial pressure of 3 mmHg, the estimated right ventricular systolic pressure is Q000111Q mmHg. Left Atrium: Left atrial size was normal in size. Right Atrium: Right atrial size was normal in size. Pericardium: There is no evidence of pericardial effusion. Mitral Valve: The mitral valve is normal in structure. No evidence of mitral valve regurgitation. No evidence of mitral valve stenosis. Tricuspid Valve: The tricuspid valve is normal in structure. Tricuspid valve regurgitation is trivial. No evidence of tricuspid stenosis. Aortic Valve: The aortic valve is tricuspid. Aortic valve regurgitation is not visualized. Aortic valve sclerosis/calcification is present, without any evidence of aortic stenosis. Pulmonic Valve: The pulmonic valve was normal in structure. Pulmonic valve regurgitation is not visualized. No evidence of pulmonic stenosis. Aorta: The aortic root is normal in size and structure. Venous: The inferior vena cava is normal in size with greater than 50% respiratory variability, suggesting right atrial pressure of 3 mmHg. IAS/Shunts: No atrial level shunt  detected by color flow Doppler.  IVC IVC diam:  1.20 cm TRICUSPID VALVE TR Peak grad:   21.3 mmHg TR Vmax:        231.00 cm/s Fransico Him MD Electronically signed by Fransico Him MD Signature Date/Time: 06/27/2022/5:51:06 PM    Final    CT LUMBAR SPINE WO CONTRAST  Result Date: 06/27/2022 CLINICAL DATA:  Low back pain and new right lower extremity weakness. History of prior back surgery with recent surgery in November of 2023. EXAM: CT LUMBAR SPINE WITHOUT CONTRAST TECHNIQUE: Multidetector CT imaging of the lumbar spine was performed without intravenous contrast administration. Multiplanar CT image reconstructions were also generated. RADIATION DOSE REDUCTION: This exam was performed according to the departmental dose-optimization program which includes automated exposure control, adjustment of the mA and/or kV according to patient size and/or use of iterative reconstruction technique. COMPARISON:  CT scan 04/08/2022 FINDINGS: Segmentation: There are five lumbar type vertebral bodies. The last full intervertebral disc space is labeled L5-S1. Alignment: Normal overall alignment. The facets are normally aligned. Vertebrae: No bone lesions or fractures are identified extensive postoperative changes with wide decompression laminectomies and partial facetectomies. Paraspinal and other soft tissues: No significant retroperitoneal findings. Age advanced aortic calcifications. Renal calculi noted. Disc levels: T12-L1: No significant spinal foraminal stenosis. L1-2: No disc protrusions, spinal foraminal stenosis. L2-3: Postoperative changes with posterior and interbody fusion hardware. The pedicle screws appear normal. No osseous interbody fusion changes are demonstrated yet. L3-4: Postoperative changes with posterior and interbody fusion hardware. Single right-sided interbody fusion device. Partial right-sided facetectomy. No obvious spinal foraminal stenosis. L4-5: Unfortunately, the interbody fusion device has become dislodged and is now in the right neural foramen  and likely causing the patient's right radicular symptoms. The L4 pedicle screws are intact. Right-sided facetectomy noted. L5-S1: The right pedicle screw is coursing along the inferior cortex of the pedicle and is partly in the right neural foramen. Wide decompressive laminectomy at this level. No spinal stenosis. Possible impingement on right L5 nerve root. IMPRESSION: 1. Postoperative changes with posterior and interbody fusion hardware at L2-L5. 2. The interbody fusion device at L4-5 has become dislodged and is now in the right neural foramen and likely causing the patient's right radicular symptoms. 3. The right pedicle screw at L5 is coursing along the inferior cortex of the pedicle and is partly in the right neural foramen. Possible impingement on the right L5 nerve root. 4. Aortic atherosclerosis. Aortic Atherosclerosis (ICD10-I70.0). Electronically Signed   By: Marijo Sanes M.D.   On: 06/27/2022 14:13   DG Swallowing Func-Speech Pathology  Result Date: 06/27/2022 Table formatting from the original result was not included. Modified Barium Swallow Study Patient Details Name: KEYO OPALEWSKI MRN: [id number] Date of Birth: [birth year] Today's Date: 06/27/2022 HPI/PMH: HPI: Oliva Berdan is a 67 yo M who presented to the ED with complaints of shortness of breath, back pain, abdominal discomfort. Admitted to ICU with sepsis.  CXR 2/13 with no active disease.  Pt with a history of GERD, hyperlipidemia, gout, diabetes, obstructive sleep apnea, DVT, status post colectomy end ileostomy. He had a posterior cervical diskectomy fusion surgery in March. Clinical Impression: Clinical Impression: Pt exhibits a primary mild pharyngoesophageal phase dysphagia exacerbated by anatomical differences. 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 This results in decreased distention and trace residue in this area that pt can sense. He had  only flash laryngeal penetration of thin liquid x 1  that spontaneously exited the vestibule during the swallow. He cleared his throat several times during the study without material seen in vestibule or trachea. He tends to have a piecemeal transit pattern to his liquids taking 2, sometimes 3 swallows to clear his oral cavity possibly in anticipation of pharyngeal clearance. There was trace vallecular and pyriform sinus residue post swallow. Esophageal scan was unremarkable. Pt educated to alternate solids and sips to facilitate pharyngoesophageal transit, ensure meats are moist (reports difficulty with steak) and small bites/sips and masticate food thoroughly. He does not report difficulty with pills therefore recommend single pills with thin. No further ST is needed. Factors that may increase risk of adverse event in presence of aspiration ([place] 2021): No data recorded Recommendations/Plan: Swallowing Evaluation Recommendations Swallowing Evaluation Recommendations Recommendations: PO diet PO Diet Recommendation: Regular; Thin liquids (Level 0) Liquid Administration via: Cup; Straw Medication Administration: Whole meds with liquid Supervision: Patient able to self-feed Swallowing strategies  : Follow solids with liquids; Small bites/sips; Slow rate Postural changes: Position pt fully upright for meals Oral care recommendations: Oral care BID (2x/day) Treatment Plan Treatment Plan Treatment recommendations: No treatment recommended at this time Follow-up recommendations: No SLP follow up Functional status assessment: Patient has not had a recent decline in their functional status. Recommendations Recommendations for follow up therapy are one component of a multi-disciplinary discharge planning process, led by the attending physician.  Recommendations may be updated based on patient status, additional functional criteria and insurance authorization. Assessment: Orofacial Exam: Orofacial Exam Oral Cavity:  Oral Hygiene: WFL Oral Cavity - Dentition: Adequate natural dentition Orofacial Anatomy: WFL Oral Motor/Sensory Function: WFL Anatomy: Anatomy: Presence of cervical hardware; Suspected cervical osteophytes (suspected cervical fusion) Thin Liquids: Thin Liquids (Level 0) Thin Liquids : Impaired Bolus delivery method: Cup; Spoon; Straw Thin Liquid - Impairment: Oral Impairment; Pharyngeal impairment Lip Closure: No labial escape Tongue control during bolus hold: Cohesive bolus between tongue to palatal seal Bolus transport/lingual motion: Brisk tongue motion Oral residue: Trace residue lining oral structures Location of oral residue : Tongue Initiation of swallow : Valleculae Soft palate elevation: Complete Laryngeal elevation: Partial or minimal superior movement and approximation Anterior hyoid excursion: Complete Epiglottic movement: Complete Laryngeal vestibule closure: Incomplete Pharyngeal stripping wave : Present - diminished Pharyngeal contraction (A/P view only): N/A Pharyngoesophageal segment opening: Partial distention/partial duration, partial obstruction of flow Tongue base retraction: Trace column of contrast or air between tongue base and PPW Pharyngeal residue: Trace residue within or on pharyngeal structures Location of pharyngeal residue: Valleculae; Pyriform sinuses Penetration/Aspiration Scale (PAS) score: 2.  Material enters airway, remains ABOVE vocal cords then ejected out  Mildly Thick Liquids: Mildly thick liquids (Level 2, nectar thick) Mildly thick liquids (Level 2, nectar thick): Impaired Bolus delivery method: Spoon; Cup Mildly Thick Liquid - Impairment: Oral Impairment; Pharyngeal impairment Lip Closure: No labial escape Tongue control during bolus hold: Cohesive bolus between tongue to palatal seal Bolus transport/lingual motion: Brisk tongue motion Oral residue: Residue collection on oral structures Location of oral residue : Tongue Initiation of swallow : Pyriform sinuses Soft palate  elevation: Complete Laryngeal elevation: Complete superior movement of thyroid cartilage with complete approximation of arytenoids to epiglottic petiole Anterior hyoid excursion: Complete Epiglottic movement: Complete Laryngeal vestibule closure: Complete: No air/contrast in laryngeal vestibule Pharyngeal stripping wave : Present - diminished Pharyngeal contraction (A/P view only): N/A Pharyngoesophageal segment opening: Partial distention/partial duration, partial obstruction of flow Tongue base retraction: Trace column of contrast or air between tongue base and PPW  Pharyngeal residue: Trace residue within or on pharyngeal structures Location of pharyngeal residue: Valleculae; Pyriform sinuses Penetration/Aspiration Scale (PAS) score: 1.  Material does not enter airway  Moderately Thick Liquids: Moderately thick liquids (Level 3, honey thick) Moderately thick liquids (Level 3, honey thick): Impaired Bolus delivery method: Spoon Moderately Thick Liquid - Impairment: Oral Impairment; Pharyngeal impairment Lip Closure: No labial escape Tongue control during bolus hold: Cohesive bolus between tongue to palatal seal Bolus transport/lingual motion: Brisk tongue motion Oral residue: Trace residue lining oral structures Location of oral residue : Tongue Initiation of swallow : Valleculae Soft palate elevation: Complete Laryngeal elevation: Complete superior movement of thyroid cartilage with complete approximation of arytenoids to epiglottic petiole Anterior hyoid excursion: Complete Epiglottic movement: Complete Laryngeal vestibule closure: Complete: No air/contrast in laryngeal vestibule Pharyngeal stripping wave : Present - complete Pharyngeal contraction (A/P view only): N/A Pharyngoesophageal segment opening: Partial distention/partial duration, partial obstruction of flow Tongue base retraction: No contrast between tongue base and posterior pharyngeal wall (PPW) Pharyngeal residue: Trace residue within or on  pharyngeal structures Location of pharyngeal residue: Valleculae; Pyriform sinuses Penetration/Aspiration Scale (PAS) score: 1.  Material does not enter airway  Puree: Puree Puree: Impaired Puree - Impairment: Oral Impairment; Pharyngeal impairment Lip Closure: No labial escape Bolus transport/lingual motion: Brisk tongue motion Oral residue: Trace residue lining oral structures Location of oral residue : Tongue Initiation of swallow: Pyriform sinuses Soft palate elevation: Complete Laryngeal elevation: Partial or minimal superior movement and approximation Anterior hyoid excursion: Complete Epiglottic movement: Partial Laryngeal vestibule closure: Complete: No air/contrast in laryngeal vestibule Pharyngeal stripping wave : Present - diminished Pharyngeal contraction (A/P view only): N/A Pharyngoesophageal segment opening: Partial distention/partial duration, partial obstruction of flow Tongue base retraction: No contrast between tongue base and posterior pharyngeal wall (PPW) Pharyngeal residue: Trace residue within or on pharyngeal structures Location of pharyngeal residue: Aryepiglottic folds Penetration/Aspiration Scale (PAS) score: 1.  Material does not enter airway Solid: Solid Solid: Impaired Solid - Impairment: Oral Impairment; Pharyngeal impairment Lip Closure: No labial escape Bolus preparation/mastication: Timely and efficient chewing and mashing Bolus transport/lingual motion: Brisk tongue motion Oral residue: Residue collection on oral structures Location of oral residue : Tongue Initiation of swallow: Valleculae Soft palate elevation: Complete Laryngeal elevation: Complete superior movement of thyroid cartilage with complete approximation of arytenoids to epiglottic petiole Anterior hyoid excursion: Complete Epiglottic movement: Complete Laryngeal vestibule closure: Complete: No air/contrast in laryngeal vestibule Pharyngeal stripping wave : Present - diminished Pharyngeal contraction (A/P view only):  N/A Pharyngoesophageal segment opening: Partial distention/partial duration, partial obstruction of flow Tongue base retraction: No contrast between tongue base and posterior pharyngeal wall (PPW) Pharyngeal residue: Complete pharyngeal clearance Penetration/Aspiration Scale (PAS) score: 1.  Material does not enter airway Pill: Pill Pill: Not Tested Compensatory Strategies: No data recorded  General Information: No data recorded Diet Prior to this Study: Regular; Thin liquids (Level 0)   Temperature : Normal   Respiratory Status: WFL   Supplemental O2: None (Room air)   History of Recent Intubation: No  Behavior/Cognition: Alert; Cooperative; Pleasant mood Self-Feeding Abilities: Able to self-feed Baseline vocal quality/speech: Normal Volitional Cough: Able to elicit Volitional Swallow: Able to elicit No data recorded Goal Planning: Prognosis for improved oropharyngeal function: Good No data recorded No data recorded Patient/Family Stated Goal: Comfortably drink water Consulted and agree with results and recommendations: Patient Pain: Pain Assessment Pain Assessment: Faces Pain Score: 4 Faces Pain Scale: 0 Facial Expression: 0 Body Movements: 0 Muscle Tension: 0 Compliance with ventilator (intubated  pts.): N/A Vocalization (extubated pts.): 0 CPOT Total: 0 Pain Location: abdomen, low back Pain Descriptors / Indicators: Sore Pain Intervention(s): Monitored during session; Repositioned End of Session: Start Time:SLP Start Time (ACUTE ONLY): 1249 Stop Time: SLP Stop Time (ACUTE ONLY): 1302 Time Calculation:SLP Time Calculation (min) (ACUTE ONLY): 13 min Charges: SLP Evaluations $ SLP Speech Visit: 1 Visit SLP Evaluations $BSS Swallow: 1 Procedure $MBS Swallow: 1 Procedure SLP visit diagnosis: SLP Visit Diagnosis: Dysphagia, pharyngoesophageal phase (R13.14); Dysphagia, oral phase (R13.11) Past Medical History: Past Medical History: Diagnosis Date  Allergy   Arthritis   osteo  CTS (carpal tunnel syndrome)   DJD  (degenerative joint disease)   DM2 (diabetes mellitus, type 2) (HCC)   DVT (deep venous thrombosis) (HCC)   GERD (gastroesophageal reflux disease)   Gout   History of adenomatous polyps of colon 12/29/2019  History of nuclear stress test   Myoview 11/17: EF 54, no ST changes, hypertensive blood pressure response, no ischemia, low risk  Hyperlipidemia   Hypothyroidism   Nicotine addiction   OSA (obstructive sleep apnea)   Prostatitis   Reactive airway disease   Sarcocystosis   Sinus arrhythmia   Sleep apnea   cpap- not wearing  Tinea corporis  Past Surgical History: Past Surgical History: Procedure Laterality Date  BILATERAL KNEE ARTHROSCOPY    right- 1997; left 2001  COLONOSCOPY  multiple last 2013  KNEE SURGERY Right 2014  torn R medial meniscus- arthroscopy done  LAPAROTOMY N/A 04/10/2022  Procedure: EXPLORATORY LAPAROTOMY, TOTAL COLECTOMY, END ILEOSTOMY;  Surgeon: Felicie Morn, MD;  Location: [facility];  Service: General;  Laterality: N/A;  PARATHYROIDECTOMY  2005  ROTATOR CUFF REPAIR  2004  right  TONSILLECTOMY    TOTAL HIP ARTHROPLASTY Left 2009  TOTAL HIP ARTHROPLASTY Right 2006  UPPER GASTROINTESTINAL ENDOSCOPY   Houston Siren 06/27/2022, 2:11 PM  CT ABDOMEN PELVIS WO CONTRAST  Result Date: 06/26/2022 CLINICAL DATA:  Acute nonlocalized abdominal pain. Sepsis. Acute kidney injury. Back pain and shortness of breath beginning Sunday. Colon resection on 11/23. EXAM: CT ABDOMEN AND PELVIS WITHOUT CONTRAST TECHNIQUE: Multidetector CT imaging of the abdomen and pelvis was performed following the standard protocol without IV contrast. RADIATION DOSE REDUCTION: This exam was performed according to the departmental dose-optimization program which includes automated exposure control, adjustment of the mA and/or kV according to patient size and/or use of iterative reconstruction technique. COMPARISON:  04/10/2022 FINDINGS: Lower chest: Atelectasis or consolidation in the right lung base. Minimal right  pleural effusion. These changes are improved since prior study. Calcification in the coronary arteries. Hepatobiliary: No focal liver abnormality is seen. No gallstones, gallbladder wall thickening, or biliary dilatation. Pancreas: Unremarkable. No pancreatic ductal dilatation or surrounding inflammatory changes. Spleen: Normal in size without focal abnormality. Adrenals/Urinary Tract: No adrenal gland nodules. Several punctate sized stones in both kidneys, largest measuring about 2 mm diameter. No hydronephrosis or hydroureter. Bladder is decompressed but the bladder wall appears diffusely thickened. This could indicate cystitis or outlet obstruction. Stomach/Bowel: Stomach is decompressed. Gastric wall appears thickened but this could just be due to under distention. Consider possibility of gastritis. Small bowel are not abnormally distended. Postoperative changes with partial colectomy, right lower quadrant ileostomy, and sigmoid colon stump. Small bowel and remaining colon are not abnormally distended. Vascular/Lymphatic: Calcification of the aorta. No aneurysm. No significant lymphadenopathy. Reproductive: Prostate gland is diffusely enlarged. Other: Residual scarring and edema in the right lower quadrant and right pararenal spaces, likely postoperative. The previous hematoma seen in this  area has been resected or resolved in the interval. Minimal residual subcapsular liver fluid collection. Resolution of previous perisplenic fluid. Musculoskeletal: Bilateral hip arthroplasties. Postoperative fixation of the lumbar spine. Degenerative changes in the lumbar spine and SI joints. IMPRESSION: 1. Interval improvement since prior study with resection or resorption of the previous right lower quadrant hematoma. Mild residual stranding and edema in this area today is likely postoperative. 2. Small residual right pleural effusion and basilar atelectasis/consolidation, improved since prior study. 3. Multiple bilateral  nonobstructing intrarenal stones. 4. Gastric wall appears thickened, possibly due to under distention or possibly gastritis. 5. Bladder wall is thickened, possibly due to cystitis or outlet obstruction. The prostate gland is enlarged. 6. Interval postoperative changes with partial colectomy and right lower quadrant ileostomy with sigmoid colon stump. 7. Aortic atherosclerosis. Electronically Signed   By: Lucienne Capers M.D.   On: 06/26/2022 17:50   DG Chest Portable 1 View  Result Date: 06/26/2022 CLINICAL DATA:  Shortness of breath EXAM: PORTABLE CHEST 1 VIEW COMPARISON:  04/17/2022, 08/19/2019 FINDINGS: Chronic elevation of the right diaphragm. Subsegmental atelectasis right base. No acute airspace disease or pleural effusion. Small clips over the left upper mediastinum as before. Cardiac size within normal limits. No pneumothorax IMPRESSION: No active disease. Chronic elevation of the right diaphragm with subsegmental atelectasis at the right base. Electronically Signed   By: Donavan Foil M.D.   On: 06/26/2022 15:28       The results of significant diagnostics from this hospitalization (including imaging, microbiology, ancillary and laboratory) are listed below for reference.     Microbiology: Recent Results (from the past 240 hour(s))  Resp panel by RT-PCR (RSV, Flu A&B, Covid) Anterior Nasal Swab     Status: None   Collection Time: 06/26/22  3:09 PM   Specimen: Anterior Nasal Swab  Result Value Ref Range Status   SARS Coronavirus 2 by RT PCR NEGATIVE NEGATIVE Final    Comment: (NOTE) SARS-CoV-2 target nucleic acids are NOT DETECTED.  The SARS-CoV-2 RNA is generally detectable in upper respiratory specimens during the acute phase of infection. The lowest concentration of SARS-CoV-2 viral copies this assay can detect is 138 copies/mL. A negative result does not preclude SARS-Cov-2 infection and should not be used as the sole basis for treatment or other patient management decisions.  A negative result may occur with  improper specimen collection/handling, submission of specimen other than nasopharyngeal swab, presence of viral mutation(s) within the areas targeted by this assay, and inadequate number of viral copies(<138 copies/mL). A negative result must be combined with clinical observations, patient history, and epidemiological information. The expected result is Negative.  Fact Sheet for Patients:  EntrepreneurPulse.com.au  Fact Sheet for Healthcare Providers:  IncredibleEmployment.be  This test is no t yet approved or cleared by the [place] FDA and  has been authorized for detection and/or diagnosis of SARS-CoV-2 by FDA under an Emergency Use Authorization (EUA). This EUA will remain  in effect (meaning this test can be used) for the duration of the COVID-19 declaration under Section 564(b)(1) of the Act, 21 U.S.C.section 360bbb-3(b)(1), unless the authorization is terminated  or revoked sooner.       Influenza A by PCR NEGATIVE NEGATIVE Final   Influenza B by PCR NEGATIVE NEGATIVE Final    Comment: (NOTE) The Xpert Xpress SARS-CoV-2/FLU/RSV plus assay is intended as an aid in the diagnosis of influenza from Nasopharyngeal swab specimens and should not be used as a sole basis for treatment. Nasal washings and aspirates  are unacceptable for Xpert Xpress SARS-CoV-2/FLU/RSV testing.  Fact Sheet for Patients: EntrepreneurPulse.com.au  Fact Sheet for Healthcare Providers: IncredibleEmployment.be  This test is not yet approved or cleared by the [place] FDA and has been authorized for detection and/or diagnosis of SARS-CoV-2 by FDA under an Emergency Use Authorization (EUA). This EUA will remain in effect (meaning this test can be used) for the duration of the COVID-19 declaration under Section 564(b)(1) of the Act, 21 U.S.C. section 360bbb-3(b)(1), unless the authorization  is terminated or revoked.     Resp Syncytial Virus by PCR NEGATIVE NEGATIVE Final    Comment: (NOTE) Fact Sheet for Patients: EntrepreneurPulse.com.au  Fact Sheet for Healthcare Providers: IncredibleEmployment.be  This test is not yet approved or cleared by the [place] FDA and has been authorized for detection and/or diagnosis of SARS-CoV-2 by FDA under an Emergency Use Authorization (EUA). This EUA will remain in effect (meaning this test can be used) for the duration of the COVID-19 declaration under Section 564(b)(1) of the Act, 21 U.S.C. section 360bbb-3(b)(1), unless the authorization is terminated or revoked.  Performed at [facility], [address], [place], [place] [address]   Culture, blood (Routine x 2)     Status: Abnormal   Collection Time: 06/26/22  3:15 PM   Specimen: BLOOD  Result Value Ref Range Status   Specimen Description   Final    BLOOD LEFT ANTECUBITAL Performed at [facility], [address] [place], [place] [address]    Special Requests   Final    BOTTLES DRAWN AEROBIC AND ANAEROBIC Blood Culture adequate volume Performed at [facility], [address], [place], [place] [address]    Culture  Setup Time   Final    GRAM NEGATIVE RODS IN BOTH AEROBIC AND ANAEROBIC BOTTLES CRITICAL RESULT CALLED TO, READ BACK BY AND VERIFIED WITH: PHARMD K. AMEND [id number] @2024$  FH    Culture (A)  Final    ENTEROBACTER CLOACAE SUSCEPTIBILITIES PERFORMED ON PREVIOUS CULTURE WITHIN THE LAST 5 DAYS. Performed at [facility], [place] [address] [place], [place] [address]    Report Status 06/30/2022 FINAL  Final  Culture, blood (Routine x 2)     Status: Abnormal   Collection Time: 06/26/22  3:52 PM   Specimen: BLOOD RIGHT ARM  Result Value Ref Range Status   Specimen Description   Final    BLOOD RIGHT ARM Performed at [facility], [place] [address]  [place], [place] [address]    Special Requests   Final    BOTTLES DRAWN AEROBIC AND ANAEROBIC Blood Culture adequate volume Performed at [facility], [place], [place] [address]    Culture  Setup Time   Final    GRAM NEGATIVE RODS IN BOTH AEROBIC AND ANAEROBIC BOTTLES CRITICAL RESULT CALLED TO, READ BACK BY AND VERIFIED WITH: PHARMD K. AMEND [id number] @1954$  FH Performed at [facility], [place] [address], [place], [place] [address]    Culture ENTEROBACTER CLOACAE (A)  Final   Report Status 06/29/2022 FINAL  Final   Organism ID, Bacteria ENTEROBACTER CLOACAE  Final      Susceptibility   Enterobacter cloacae - MIC*    CEFEPIME <=0.12 SENSITIVE Sensitive     CEFTAZIDIME <=1 SENSITIVE Sensitive     CIPROFLOXACIN <=0.25 SENSITIVE Sensitive     GENTAMICIN <=1 SENSITIVE Sensitive     IMIPENEM <=0.25 SENSITIVE Sensitive     TRIMETH/SULFA <=20 SENSITIVE Sensitive  PIP/TAZO <=4 SENSITIVE Sensitive     * ENTEROBACTER CLOACAE  Blood Culture ID Panel (Reflexed)     Status: Abnormal   Collection Time: 06/26/22  3:52 PM  Result Value Ref Range Status   Enterococcus faecalis NOT DETECTED NOT DETECTED Final   Enterococcus Faecium NOT DETECTED NOT DETECTED Final   Listeria monocytogenes NOT DETECTED NOT DETECTED Final   Staphylococcus species NOT DETECTED NOT DETECTED Final   Staphylococcus aureus (BCID) NOT DETECTED NOT DETECTED Final   Staphylococcus epidermidis NOT DETECTED NOT DETECTED Final   Staphylococcus lugdunensis NOT DETECTED NOT DETECTED Final   Streptococcus species NOT DETECTED NOT DETECTED Final   Streptococcus agalactiae NOT DETECTED NOT DETECTED Final   Streptococcus pneumoniae NOT DETECTED NOT DETECTED Final   Streptococcus pyogenes NOT DETECTED NOT DETECTED Final   A.calcoaceticus-baumannii NOT DETECTED NOT DETECTED Final   Bacteroides fragilis NOT DETECTED NOT DETECTED Final   Enterobacterales DETECTED (A) NOT DETECTED Final    Comment:  Enterobacterales represent a large order of gram negative bacteria, not a single organism. CRITICAL RESULT CALLED TO, READ BACK BY AND VERIFIED WITH: PHARMD K. AMEND [id number] @1954$  FH    Enterobacter cloacae complex DETECTED (A) NOT DETECTED Final    Comment: CRITICAL RESULT CALLED TO, READ BACK BY AND VERIFIED WITH: PHARMD K. AMEND [id number] @1954$  FH    Escherichia coli NOT DETECTED NOT DETECTED Final   Klebsiella aerogenes NOT DETECTED NOT DETECTED Final   Klebsiella oxytoca NOT DETECTED NOT DETECTED Final   Klebsiella pneumoniae NOT DETECTED NOT DETECTED Final   Proteus species NOT DETECTED NOT DETECTED Final   Salmonella species NOT DETECTED NOT DETECTED Final   Serratia marcescens NOT DETECTED NOT DETECTED Final   Haemophilus influenzae NOT DETECTED NOT DETECTED Final   Neisseria meningitidis NOT DETECTED NOT DETECTED Final   Pseudomonas aeruginosa NOT DETECTED NOT DETECTED Final   Stenotrophomonas maltophilia NOT DETECTED NOT DETECTED Final   Candida albicans NOT DETECTED NOT DETECTED Final   Candida auris NOT DETECTED NOT DETECTED Final   Candida glabrata NOT DETECTED NOT DETECTED Final   Candida krusei NOT DETECTED NOT DETECTED Final   Candida parapsilosis NOT DETECTED NOT DETECTED Final   Candida tropicalis NOT DETECTED NOT DETECTED Final   Cryptococcus neoformans/gattii NOT DETECTED NOT DETECTED Final   CTX-M ESBL NOT DETECTED NOT DETECTED Final   Carbapenem resistance IMP NOT DETECTED NOT DETECTED Final   Carbapenem resistance KPC NOT DETECTED NOT DETECTED Final   Carbapenem resistance NDM NOT DETECTED NOT DETECTED Final   Carbapenem resist OXA 48 LIKE NOT DETECTED NOT DETECTED Final   Carbapenem resistance VIM NOT DETECTED NOT DETECTED Final    Comment: Performed at [facility], [place] [address], [place], [place] [address]  Urine Culture     Status: Abnormal   Collection Time: 06/26/22  8:15 PM   Specimen: Urine, Clean Catch  Result Value Ref Range Status   Specimen  Description   Final    URINE, CLEAN CATCH Performed at [facility], [place] [address], [place], [place] [address]    Special Requests   Final    NONE Reflexed from [id number] Performed at [facility], [address] [place], [place] [address]    Culture (A)  Final    <10,000 COLONIES/mL INSIGNIFICANT GROWTH Performed at [facility], [place] [address], [place], [place] [address]    Report Status 06/27/2022 FINAL  Final  MRSA Next Gen by PCR, Nasal     Status: None  Collection Time: 06/26/22 11:51 PM   Specimen: Nasal Mucosa; Nasal Swab  Result Value Ref Range Status   MRSA by PCR Next Gen NOT DETECTED NOT DETECTED Final    Comment: (NOTE) The GeneXpert MRSA Assay (FDA approved for NASAL specimens only), is one component of a comprehensive MRSA colonization surveillance program. It is not intended to diagnose MRSA infection nor to guide or monitor treatment for MRSA infections. Test performance is not FDA approved in patients less than 61 years old. Performed at [facility], [place] [address] [place], [place] [address]   Gastrointestinal Panel by PCR , Stool     Status: None   Collection Time: 06/27/22 11:52 AM   Specimen: Stool  Result Value Ref Range Status   Campylobacter species NOT DETECTED NOT DETECTED Final   Plesimonas shigelloides NOT DETECTED NOT DETECTED Final   Salmonella species NOT DETECTED NOT DETECTED Final   Yersinia enterocolitica NOT DETECTED NOT DETECTED Final   Vibrio species NOT DETECTED NOT DETECTED Final   Vibrio cholerae NOT DETECTED NOT DETECTED Final   Enteroaggregative E coli (EAEC) NOT DETECTED NOT DETECTED Final   Enteropathogenic E coli (EPEC) NOT DETECTED NOT DETECTED Final   Enterotoxigenic E coli (ETEC) NOT DETECTED NOT DETECTED Final   Shiga like toxin producing E coli (STEC) NOT DETECTED NOT DETECTED Final   Shigella/Enteroinvasive E coli (EIEC) NOT DETECTED NOT DETECTED Final   Cryptosporidium NOT DETECTED  NOT DETECTED Final   Cyclospora cayetanensis NOT DETECTED NOT DETECTED Final   Entamoeba histolytica NOT DETECTED NOT DETECTED Final   Giardia lamblia NOT DETECTED NOT DETECTED Final   Adenovirus F40/41 NOT DETECTED NOT DETECTED Final   Astrovirus NOT DETECTED NOT DETECTED Final   Norovirus GI/GII NOT DETECTED NOT DETECTED Final   Rotavirus A NOT DETECTED NOT DETECTED Final   Sapovirus (I, II, IV, and V) NOT DETECTED NOT DETECTED Final    Comment: Performed at [facility], [address] [place], [place] [address]     Labs:  CBC: Recent Labs  Lab 06/26/22 1517 06/27/22 0014 06/27/22 0559 06/28/22 0651 06/29/22 0432 06/30/22 0314  WBC 31.3* 21.9* 15.8* 10.8* 7.4 7.1  NEUTROABS 28.5*  --   --   --  6.2  --   HGB 12.1* 9.7* 10.0* 9.4* 8.5* 8.2*  HCT 38.9* 31.6* 32.9* 30.6* 26.7* 26.9*  MCV 83.8 84.5 85.0 85.0 84.8 85.7  PLT 223 160 142* 163 131* 133*   BMP &GFR Recent Labs  Lab 06/27/22 0014 06/27/22 0559 06/28/22 0651 06/29/22 0432 06/30/22 0314  NA 131* 133* 139 134* 136  K 4.2 3.8 4.3 3.7 3.8  CL 98 98 103 101 104  CO2 22 21* 24 21* 22  GLUCOSE 117* 99 137* 91 100*  BUN 27* 27* 24* 24* 23  CREATININE 2.35* 2.21* 2.24* 2.21* 2.38*  CALCIUM 8.0* 8.2* 8.5* 8.3* 8.4*  MG 0.9* 1.8 3.0* 2.3 2.3  PHOS 3.8 3.6  --  3.2 4.0   Estimated Creatinine Clearance: 36.4 mL/min (A) (by C-G formula based on SCr of 2.38 mg/dL (H)). Liver & Pancreas: Recent Labs  Lab 06/26/22 1517 06/27/22 0014 06/29/22 0432 06/30/22 0314  AST 20 24 41  --   ALT 16 17 30  $ --   ALKPHOS 77 67 187*  --   BILITOT 2.6* 2.1* 2.7*  --   PROT 7.8 5.8* 5.8*  --   ALBUMIN 3.8 2.3* 1.9* 1.9*   No results for input(s): "LIPASE", "AMYLASE" in the last  168 hours. No results for input(s): "AMMONIA" in the last 168 hours. Diabetic: Recent Labs    06/28/22 1313  HGBA1C 5.7*   Recent Labs  Lab 06/27/22 2319 06/28/22 0313 06/28/22 0753 06/28/22 1128 06/28/22 1532  GLUCAP 126* 129*  115* 172* 128*   Cardiac Enzymes: Recent Labs  Lab 06/29/22 0432  CKTOTAL 95   No results for input(s): "PROBNP" in the last 8760 hours. Coagulation Profile: Recent Labs  Lab 06/26/22 1517  INR 2.6*   Thyroid Function Tests: Recent Labs    06/28/22 0220  TSH 1.749   Lipid Profile: No results for input(s): "CHOL", "HDL", "LDLCALC", "TRIG", "CHOLHDL", "LDLDIRECT" in the last 72 hours. Anemia Panel: Recent Labs    06/29/22 0432  VITAMINB12 723  FOLATE 18.2  FERRITIN 321  TIBC 178*  IRON 12*  RETICCTPCT 0.4   Urine analysis:    Component Value Date/Time   COLORURINE YELLOW 06/26/2022 2015   APPEARANCEUR HAZY (A) 06/26/2022 2015   LABSPEC 1.007 06/26/2022 2015   PHURINE 6.0 06/26/2022 2015   GLUCOSEU NEGATIVE 06/26/2022 2015   HGBUR MODERATE (A) 06/26/2022 2015   BILIRUBINUR NEGATIVE 06/26/2022 2015   KETONESUR NEGATIVE 06/26/2022 2015   PROTEINUR 100 (A) 06/26/2022 2015   UROBILINOGEN 0.2 04/26/2014 1212   NITRITE NEGATIVE 06/26/2022 2015   LEUKOCYTESUR SMALL (A) 06/26/2022 2015   Sepsis Labs: Invalid input(s): "PROCALCITONIN", "LACTICIDVEN"   SIGNED:  Mercy Riding, MD  [facility] 06/30/2022, 3:48 PM

## 2022-06-30 NOTE — Progress Notes (Addendum)
Pharmacy Antibiotic Note  Kyle Delacruz is a 67 y.o. male admitted on 06/26/2022 with bacteremia, likely GI source.  Initially with septic shock requiring pressor support, two sets of blood cultures positive with GNR, one showing Enterobacter cloacae.  Tmax 103.2 and WBC 15.8 initially, now afebrile and WBC wnl.  Patient was initially treated with IV Vancomycin/Flagyl/Cefepime, then de-escalated to Cefepime alone.  ID recommends completing 7 days of antibiotics through 2/19.  Pharmacy has been consulted for Ciprofloxacin dosing.  Plan: Stop IV Cefepime. Start Ciprofloxacin 500 mg PO q12h (to complete 7 days of antibiotics through 2/19 per ID).   Height: 5' 10"$  (177.8 cm) Weight: 98.2 kg (216 lb 7.9 oz) IBW/kg (Calculated) : 73  Temp (24hrs), Avg:98.7 F (37.1 C), Min:98.4 F (36.9 C), Max:98.9 F (37.2 C)  Recent Labs  Lab 06/26/22 1717 06/27/22 0014 06/27/22 0315 06/27/22 0559 06/27/22 1105 06/28/22 0651 06/28/22 0836 06/29/22 0432 06/30/22 0314  WBC  --  21.9*  --  15.8*  --  10.8*  --  7.4 7.1  CREATININE  --  2.35*  --  2.21*  --  2.24*  --  2.21* 2.38*  LATICACIDVEN 4.6* 1.9 3.3*  --  2.2*  --  1.6  --   --     Estimated Creatinine Clearance: 36.4 mL/min (A) (by C-G formula based on SCr of 2.38 mg/dL (H)).    Allergies  Allergen Reactions   Bee Venom     Other reaction(s): Unknown   Lisinopril     Other reaction(s): Unknown   Metoclopramide Other (See Comments)    Other reaction(s): shaking too bad "Sweat like crazy"    Penicillins Hives    Did it involve swelling of the face/tongue/throat, SOB, or low BP? Yes Did it involve sudden or severe rash/hives, skin peeling, or any reaction on the inside of your mouth or nose? Yes Did you need to seek medical attention at a hospital or doctor's office? No When did it last happen?  childhood      If all above answers are "NO", may proceed with cephalosporin use.     Shellfish Allergy Swelling    Antimicrobials  this admission: Vancomycin 2/13 >> 2/13 Flagyl 2/13 >> 2/15 Cefepime 2/13 >> 2/16 Ciprofloxacin 2/17 >> [2/19]  Microbiology results: 2/13 MRSA, Flu A/B: neg  2/13 UCx: < 10,000 insignificant growth  2/13 BCx: Enterobacter cloacae - pan-sensitive 2/14 GI panel: unremarkable  Thank you for allowing pharmacy to be a part of this patient's care.  Vance Peper, PharmD PGY-2 Pharmacy Resident Phone [phone] 06/30/2022 8:19 AM   Please check AMION for all [place] phone numbers After 10:00 PM, call [place] [phone]

## 2022-06-30 NOTE — TOC Transition Note (Signed)
Transition of Care [facility]) - CM/SW Discharge Note   Patient Details  Name: Kyle Delacruz MRN: [id number] Date of Birth: [birth year]  Transition of Care [facility]) CM/SW Contact:  Tom-Johnson, Renea Ee, RN Phone Number: 06/30/2022, 1:20 PM   Clinical Narrative:     Patient is scheduled for discharge today. Home health info on AVS. Outpatient f/u and instructions on AVS. Family to transport at discharge. No further TOC needs noted.            Final next level of care: Home w Home Health Services Barriers to Discharge: Barriers Resolved   Patient Goals and CMS Choice CMS Medicare.gov Compare Post Acute Care list provided to:: Patient Choice offered to / list presented to : Patient, Spouse  Discharge Placement                  Patient to be transferred to facility by: Family      Discharge Plan and Services Additional resources added to the After Visit Summary for     Discharge Planning Services: CM Consult Post Acute Care Choice: Home Health          DME Arranged: N/A DME Agency: NA       HH Arranged: PT, RN, NA (Resumption of care) [place]: [place] ([place]) Date [place]: 06/28/22 Time [place]: [id number] Representative spoke with at [place]: [place] Determinants of Health ([place]) Interventions [place]: No Food Insecurity (04/09/2022)  Housing: Low Risk  (04/09/2022)  Transportation Needs: No Transportation Needs (04/09/2022)  Utilities: Not At Risk (04/09/2022)  Tobacco Use: Medium Risk (06/26/2022)     Readmission Risk Interventions     No data to display

## 2022-06-30 NOTE — Progress Notes (Signed)
Mobility Specialist Progress Note    06/30/22 1121  Mobility  Activity Ambulated with assistance in hallway  Level of Assistance Moderate assist, patient does 50-74%  Assistive Device Front wheel walker  Distance Ambulated (ft) 120 ft  Activity Response Tolerated well  Mobility Referral Yes  $Mobility charge 1 Mobility   Pt received in bed and agreeable. C/o 6/10 groin pain from his back. ModA to stand from bed and contact guard during ambulation. Returned to bed with call bell in reach.   Hildred Alamin Mobility Specialist  Please Psychologist, sport and exercise or Rehab Office at [phone]

## 2022-06-30 NOTE — Plan of Care (Signed)

## 2022-07-02 DIAGNOSIS — Z4789 Encounter for other orthopedic aftercare: Secondary | ICD-10-CM | POA: Diagnosis not present

## 2022-07-02 DIAGNOSIS — Z432 Encounter for attention to ileostomy: Secondary | ICD-10-CM | POA: Diagnosis not present

## 2022-07-02 DIAGNOSIS — I2699 Other pulmonary embolism without acute cor pulmonale: Secondary | ICD-10-CM | POA: Diagnosis not present

## 2022-07-02 DIAGNOSIS — J4489 Other specified chronic obstructive pulmonary disease: Secondary | ICD-10-CM | POA: Diagnosis not present

## 2022-07-02 DIAGNOSIS — E119 Type 2 diabetes mellitus without complications: Secondary | ICD-10-CM | POA: Diagnosis not present

## 2022-07-02 DIAGNOSIS — I82411 Acute embolism and thrombosis of right femoral vein: Secondary | ICD-10-CM | POA: Diagnosis not present

## 2022-07-02 LAB — CULTURE, BLOOD (ROUTINE X 2): Special Requests: ADEQUATE

## 2022-07-03 DIAGNOSIS — M48062 Spinal stenosis, lumbar region with neurogenic claudication: Secondary | ICD-10-CM | POA: Diagnosis not present

## 2022-07-06 DIAGNOSIS — M5432 Sciatica, left side: Secondary | ICD-10-CM | POA: Diagnosis not present

## 2022-07-06 DIAGNOSIS — K589 Irritable bowel syndrome without diarrhea: Secondary | ICD-10-CM | POA: Diagnosis not present

## 2022-07-06 DIAGNOSIS — G8929 Other chronic pain: Secondary | ICD-10-CM | POA: Diagnosis not present

## 2022-07-06 DIAGNOSIS — I4891 Unspecified atrial fibrillation: Secondary | ICD-10-CM | POA: Diagnosis not present

## 2022-07-06 DIAGNOSIS — K219 Gastro-esophageal reflux disease without esophagitis: Secondary | ICD-10-CM | POA: Diagnosis not present

## 2022-07-06 DIAGNOSIS — Z432 Encounter for attention to ileostomy: Secondary | ICD-10-CM | POA: Diagnosis not present

## 2022-07-06 DIAGNOSIS — G4733 Obstructive sleep apnea (adult) (pediatric): Secondary | ICD-10-CM | POA: Diagnosis not present

## 2022-07-06 DIAGNOSIS — Z932 Ileostomy status: Secondary | ICD-10-CM | POA: Diagnosis not present

## 2022-07-06 DIAGNOSIS — E7800 Pure hypercholesterolemia, unspecified: Secondary | ICD-10-CM | POA: Diagnosis not present

## 2022-07-06 DIAGNOSIS — M4722 Other spondylosis with radiculopathy, cervical region: Secondary | ICD-10-CM | POA: Diagnosis not present

## 2022-07-06 DIAGNOSIS — J449 Chronic obstructive pulmonary disease, unspecified: Secondary | ICD-10-CM | POA: Diagnosis not present

## 2022-07-06 DIAGNOSIS — M48062 Spinal stenosis, lumbar region with neurogenic claudication: Secondary | ICD-10-CM | POA: Diagnosis not present

## 2022-07-06 DIAGNOSIS — N289 Disorder of kidney and ureter, unspecified: Secondary | ICD-10-CM | POA: Diagnosis not present

## 2022-07-06 DIAGNOSIS — I480 Paroxysmal atrial fibrillation: Secondary | ICD-10-CM | POA: Diagnosis not present

## 2022-07-06 DIAGNOSIS — E785 Hyperlipidemia, unspecified: Secondary | ICD-10-CM | POA: Diagnosis not present

## 2022-07-06 DIAGNOSIS — Z4789 Encounter for other orthopedic aftercare: Secondary | ICD-10-CM | POA: Diagnosis not present

## 2022-07-06 DIAGNOSIS — R58 Hemorrhage, not elsewhere classified: Secondary | ICD-10-CM | POA: Diagnosis not present

## 2022-07-06 DIAGNOSIS — I2699 Other pulmonary embolism without acute cor pulmonale: Secondary | ICD-10-CM | POA: Diagnosis not present

## 2022-07-06 DIAGNOSIS — R578 Other shock: Secondary | ICD-10-CM | POA: Diagnosis not present

## 2022-07-06 DIAGNOSIS — R251 Tremor, unspecified: Secondary | ICD-10-CM | POA: Diagnosis not present

## 2022-07-06 DIAGNOSIS — M4802 Spinal stenosis, cervical region: Secondary | ICD-10-CM | POA: Diagnosis not present

## 2022-07-06 DIAGNOSIS — R7881 Bacteremia: Secondary | ICD-10-CM | POA: Diagnosis not present

## 2022-07-06 DIAGNOSIS — I2510 Atherosclerotic heart disease of native coronary artery without angina pectoris: Secondary | ICD-10-CM | POA: Diagnosis not present

## 2022-07-06 DIAGNOSIS — R198 Other specified symptoms and signs involving the digestive system and abdomen: Secondary | ICD-10-CM | POA: Diagnosis not present

## 2022-07-06 DIAGNOSIS — M1990 Unspecified osteoarthritis, unspecified site: Secondary | ICD-10-CM | POA: Diagnosis not present

## 2022-07-06 DIAGNOSIS — M109 Gout, unspecified: Secondary | ICD-10-CM | POA: Diagnosis not present

## 2022-07-06 DIAGNOSIS — M4726 Other spondylosis with radiculopathy, lumbar region: Secondary | ICD-10-CM | POA: Diagnosis not present

## 2022-07-06 DIAGNOSIS — I10 Essential (primary) hypertension: Secondary | ICD-10-CM | POA: Diagnosis not present

## 2022-07-06 DIAGNOSIS — E119 Type 2 diabetes mellitus without complications: Secondary | ICD-10-CM | POA: Diagnosis not present

## 2022-07-06 DIAGNOSIS — I110 Hypertensive heart disease with heart failure: Secondary | ICD-10-CM | POA: Diagnosis not present

## 2022-07-06 DIAGNOSIS — I5032 Chronic diastolic (congestive) heart failure: Secondary | ICD-10-CM | POA: Diagnosis not present

## 2022-07-06 DIAGNOSIS — I82411 Acute embolism and thrombosis of right femoral vein: Secondary | ICD-10-CM | POA: Diagnosis not present

## 2022-07-06 DIAGNOSIS — M5431 Sciatica, right side: Secondary | ICD-10-CM | POA: Diagnosis not present

## 2022-07-06 DIAGNOSIS — D510 Vitamin B12 deficiency anemia due to intrinsic factor deficiency: Secondary | ICD-10-CM | POA: Diagnosis not present

## 2022-07-06 DIAGNOSIS — E039 Hypothyroidism, unspecified: Secondary | ICD-10-CM | POA: Diagnosis not present

## 2022-07-06 DIAGNOSIS — E559 Vitamin D deficiency, unspecified: Secondary | ICD-10-CM | POA: Diagnosis not present

## 2022-07-11 DIAGNOSIS — I82411 Acute embolism and thrombosis of right femoral vein: Secondary | ICD-10-CM | POA: Diagnosis not present

## 2022-07-11 DIAGNOSIS — M4726 Other spondylosis with radiculopathy, lumbar region: Secondary | ICD-10-CM | POA: Diagnosis not present

## 2022-07-11 DIAGNOSIS — Z4789 Encounter for other orthopedic aftercare: Secondary | ICD-10-CM | POA: Diagnosis not present

## 2022-07-11 DIAGNOSIS — Z432 Encounter for attention to ileostomy: Secondary | ICD-10-CM | POA: Diagnosis not present

## 2022-07-11 DIAGNOSIS — I2699 Other pulmonary embolism without acute cor pulmonale: Secondary | ICD-10-CM | POA: Diagnosis not present

## 2022-07-11 DIAGNOSIS — E119 Type 2 diabetes mellitus without complications: Secondary | ICD-10-CM | POA: Diagnosis not present

## 2022-07-12 DIAGNOSIS — E119 Type 2 diabetes mellitus without complications: Secondary | ICD-10-CM | POA: Diagnosis not present

## 2022-07-12 DIAGNOSIS — I2699 Other pulmonary embolism without acute cor pulmonale: Secondary | ICD-10-CM | POA: Diagnosis not present

## 2022-07-12 DIAGNOSIS — I82411 Acute embolism and thrombosis of right femoral vein: Secondary | ICD-10-CM | POA: Diagnosis not present

## 2022-07-12 DIAGNOSIS — Z432 Encounter for attention to ileostomy: Secondary | ICD-10-CM | POA: Diagnosis not present

## 2022-07-12 DIAGNOSIS — M4726 Other spondylosis with radiculopathy, lumbar region: Secondary | ICD-10-CM | POA: Diagnosis not present

## 2022-07-12 DIAGNOSIS — Z4789 Encounter for other orthopedic aftercare: Secondary | ICD-10-CM | POA: Diagnosis not present

## 2022-07-16 DIAGNOSIS — Z432 Encounter for attention to ileostomy: Secondary | ICD-10-CM | POA: Diagnosis not present

## 2022-07-16 DIAGNOSIS — M4726 Other spondylosis with radiculopathy, lumbar region: Secondary | ICD-10-CM | POA: Diagnosis not present

## 2022-07-16 DIAGNOSIS — Z4789 Encounter for other orthopedic aftercare: Secondary | ICD-10-CM | POA: Diagnosis not present

## 2022-07-16 DIAGNOSIS — I2699 Other pulmonary embolism without acute cor pulmonale: Secondary | ICD-10-CM | POA: Diagnosis not present

## 2022-07-16 DIAGNOSIS — E119 Type 2 diabetes mellitus without complications: Secondary | ICD-10-CM | POA: Diagnosis not present

## 2022-07-16 DIAGNOSIS — I82411 Acute embolism and thrombosis of right femoral vein: Secondary | ICD-10-CM | POA: Diagnosis not present

## 2022-07-17 DIAGNOSIS — I82411 Acute embolism and thrombosis of right femoral vein: Secondary | ICD-10-CM | POA: Diagnosis not present

## 2022-07-17 DIAGNOSIS — M4726 Other spondylosis with radiculopathy, lumbar region: Secondary | ICD-10-CM | POA: Diagnosis not present

## 2022-07-17 DIAGNOSIS — Z4789 Encounter for other orthopedic aftercare: Secondary | ICD-10-CM | POA: Diagnosis not present

## 2022-07-17 DIAGNOSIS — E119 Type 2 diabetes mellitus without complications: Secondary | ICD-10-CM | POA: Diagnosis not present

## 2022-07-17 DIAGNOSIS — Z432 Encounter for attention to ileostomy: Secondary | ICD-10-CM | POA: Diagnosis not present

## 2022-07-17 DIAGNOSIS — I2699 Other pulmonary embolism without acute cor pulmonale: Secondary | ICD-10-CM | POA: Diagnosis not present

## 2022-07-18 ENCOUNTER — Inpatient Hospital Stay: Payer: Medicare Other | Admitting: Primary Care

## 2022-07-19 ENCOUNTER — Ambulatory Visit (INDEPENDENT_AMBULATORY_CARE_PROVIDER_SITE_OTHER): Payer: Medicare Other | Admitting: Primary Care

## 2022-07-19 ENCOUNTER — Encounter: Payer: Self-pay | Admitting: Primary Care

## 2022-07-19 VITALS — BP 102/58 | HR 57 | Temp 98.1°F | Ht 69.5 in | Wt 195.0 lb

## 2022-07-19 DIAGNOSIS — J449 Chronic obstructive pulmonary disease, unspecified: Secondary | ICD-10-CM | POA: Diagnosis not present

## 2022-07-19 DIAGNOSIS — G4733 Obstructive sleep apnea (adult) (pediatric): Secondary | ICD-10-CM

## 2022-07-19 DIAGNOSIS — I480 Paroxysmal atrial fibrillation: Secondary | ICD-10-CM | POA: Diagnosis not present

## 2022-07-19 DIAGNOSIS — Z8669 Personal history of other diseases of the nervous system and sense organs: Secondary | ICD-10-CM

## 2022-07-19 DIAGNOSIS — J9601 Acute respiratory failure with hypoxia: Secondary | ICD-10-CM

## 2022-07-19 DIAGNOSIS — R0683 Snoring: Secondary | ICD-10-CM

## 2022-07-19 DIAGNOSIS — I2699 Other pulmonary embolism without acute cor pulmonale: Secondary | ICD-10-CM | POA: Diagnosis not present

## 2022-07-19 NOTE — Patient Instructions (Addendum)
We will get baseline pulmonary function testing to assess for COPD  We will check sleep study to reassess sleep apnea, after testing consider ENT referral for possible inspire device due to CPAP failure  Recommendations: Continue Eliquis '5mg'$  twice daily indefinitely d/t hx DVT/PE  Continue with physical therapy Use Albuterol 2 puffs every 4-6 hours as needed for shortness of breath/wheezing  Orders: PFTs re: former smoker, COPD  PSG re: hx OSA, loud snoring   Follow-up: Please schedule 30 mins new patient visit with Dr. Ander Slade in 8-12 weeks with 1 hour PFT prior

## 2022-07-19 NOTE — Progress Notes (Signed)
$'@Patient'S$  ID: Kyle Delacruz, male    DOB: 12/27/1955, 67 y.o.   MRN: [id number]  Chief Complaint  Patient presents with   Hospitalization Follow-up    Inpatient 06/26/22 - 06/30/22.  Hx Pulmonary embolism.  C/o SOB with exertion.    Referring provider: Prince Solian, MD  HPI: 67 year old male, former smoker.  Past medical history significant for paroxysmal A-fib, COPD, PE and DVT on Eliquis, coronary artery disease, type 2 diabetes, prostate cancer, back surgeries, colonic ileus status post colectomy and end ileostomy, hypothyroidism.  07/19/2022 Patient presents today for 3 month follow-up pulmonary embolism. He was admitted in November 2023 following lumbar surgery for acute submassive pulmonary embolism with hemorrhagic shock.  Right lower extremity DVT.  Echo/CTA showing some RV strain. Previously on anticoagulation, he was off anticoagulation for 2 months. Hospital course was complicated by colonic ileus. CT results showed large pericolonic hematoma tracking into the peritoneum. He underwent emergency ex lap with total colectomy and end ileostomy on 04/10/22. He went into afib in setting of acute illness. He saw cardiology on 2/6 and had no recurrent atrial fibrillation on his monitor. Patient was readmitted recently on June 26, 2022 for septic shock due to enterobacter cloacae bacteremia likely GI source.  He briefly required vasopressors in ED.  Sepsis physiology resolved.  TTE without significant findings.  Patient was discharged on ciprofloxacin for total of 4 days to be completed outpatient.  He remains on long term anticoagulation for hx DVT/pulmonary embolism and PAF. He is doing alright today. He has some shortness of breath with activity. Tolerating Eliquis '5mg'$  twice daily. No bleeding. He had echocardiogram on 06/27/22 that showed normal RV function and size. He has dx OSA, he did not tolerate CPAP. He is open to Zenda device.    Allergies  Allergen Reactions   Bee Venom      Other reaction(s): Unknown   Lisinopril     Other reaction(s): Unknown   Metoclopramide Other (See Comments)    Other reaction(s): shaking too bad "Sweat like crazy"    Penicillins Hives    Did it involve swelling of the face/tongue/throat, SOB, or low BP? Yes Did it involve sudden or severe rash/hives, skin peeling, or any reaction on the inside of your mouth or nose? Yes Did you need to seek medical attention at a hospital or doctor's office? No When did it last happen?  childhood      If all above answers are "NO", may proceed with cephalosporin use.     Shellfish Allergy Swelling     There is no immunization history on file for this patient.  Past Medical History:  Diagnosis Date   Allergy    Arthritis    osteo   CTS (carpal tunnel syndrome)    DJD (degenerative joint disease)    DM2 (diabetes mellitus, type 2) (HCC)    DVT (deep venous thrombosis) (HCC)    GERD (gastroesophageal reflux disease)    Gout    History of adenomatous polyps of colon 12/29/2019   History of nuclear stress test    Myoview 11/17: EF 54, no ST changes, hypertensive blood pressure response, no ischemia, low risk   Hyperlipidemia    Hypothyroidism    Nicotine addiction    OSA (obstructive sleep apnea)    Prostatitis    Reactive airway disease    Sarcocystosis    Sinus arrhythmia    Sleep apnea    cpap- not wearing   Tinea corporis  Tobacco History: Social History   Tobacco Use  Smoking Status Former   Packs/day: 0.50   Years: 45.00   Total pack years: 22.50   Types: Cigarettes   Quit date: 2020   Years since quitting: 4.1  Smokeless Tobacco Never   Counseling given: Not Answered   Outpatient Medications Prior to Visit  Medication Sig Dispense Refill   albuterol (VENTOLIN HFA) 108 (90 Base) MCG/ACT inhaler Inhale 2 puffs into the lungs every 6 (six) hours as needed for wheezing or shortness of breath. 8 g 2   allopurinol (ZYLOPRIM) 100 MG tablet Take 1 tablet (100 mg  total) by mouth at bedtime. 90 tablet 0   CRESTOR 20 MG tablet Take 20 mg by mouth daily.      ferrous sulfate 325 (65 FE) MG tablet Take 325 mg by mouth daily.     fluticasone (FLONASE) 50 MCG/ACT nasal spray INSTILL 2 SPRAYS IN EACH NOSTRIL DAILY     levothyroxine (SYNTHROID, LEVOTHROID) 50 MCG tablet Take 50 mcg by mouth daily.     loperamide (IMODIUM) 2 MG capsule Take 2 capsules (4 mg total) by mouth every 8 (eight) hours as needed for diarrhea or loose stools. 30 capsule 0   metoprolol tartrate (LOPRESSOR) 50 MG tablet Take 1 tablet (50 mg total) by mouth 2 (two) times daily. 180 tablet 3   Multiple Vitamin (MULTIVITAMIN WITH MINERALS) TABS tablet Take 1 tablet by mouth daily.     omeprazole (PRILOSEC) 40 MG capsule Take 40 mg by mouth 2 (two) times daily.     oxyCODONE (OXY IR/ROXICODONE) 5 MG immediate release tablet Take 1 tablet (5 mg total) by mouth every 6 (six) hours as needed for moderate pain, severe pain or breakthrough pain. 20 tablet 0   Probiotic Product (ALIGN) 4 MG CAPS Take 1 capsule by mouth daily.     Vitamin D, Ergocalciferol, (DRISDOL) 1.25 MG (50000 UNIT) CAPS capsule Take 50,000 Units by mouth every 7 (seven) days.     apixaban (ELIQUIS) 5 MG TABS tablet Take 1 tablet (5 mg total) by mouth 2 (two) times daily. 60 tablet 0   No facility-administered medications prior to visit.   Review of Systems  Review of Systems  Constitutional: Negative.   HENT: Negative.    Respiratory:  Positive for shortness of breath. Negative for cough, chest tightness and wheezing.   Cardiovascular: Negative.  Negative for leg swelling.   Physical Exam  BP (!) 102/58 (BP Location: Right Arm, Patient Position: Sitting, Cuff Size: Normal)   Pulse (!) 57   Temp 98.1 F (36.7 C) (Oral)   Ht 5' 9.5" (1.765 m)   Wt 195 lb (88.5 kg)   SpO2 99%   BMI 28.38 kg/m  Physical Exam Constitutional:      General: He is not in acute distress.    Appearance: Normal appearance.  HENT:      Head: Normocephalic and atraumatic.  Cardiovascular:     Rate and Rhythm: Normal rate and regular rhythm.     Comments: RRR Pulmonary:     Effort: Pulmonary effort is normal.     Breath sounds: Normal breath sounds.     Comments: CTA Abdominal:     Comments: Ileostomy  Musculoskeletal:     Cervical back: Normal range of motion and neck supple.     Comments: Ambulating with rolling walker   Skin:    General: Skin is warm and dry.  Neurological:     General: No focal deficit present.  Mental Status: He is alert and oriented to person, place, and time. Mental status is at baseline.  Psychiatric:        Mood and Affect: Mood normal.        Behavior: Behavior normal.        Thought Content: Thought content normal.        Judgment: Judgment normal.      Lab Results:  CBC    Component Value Date/Time   WBC 7.1 06/30/2022 0314   RBC 3.14 (L) 06/30/2022 0314   HGB 8.2 (L) 06/30/2022 0314   HCT 26.9 (L) 06/30/2022 0314   PLT 133 (L) 06/30/2022 0314   MCV 85.7 06/30/2022 0314   MCH 26.1 06/30/2022 0314   MCHC 30.5 06/30/2022 0314   RDW 18.4 (H) 06/30/2022 0314   LYMPHSABS 0.7 06/29/2022 0432   MONOABS 0.4 06/29/2022 0432   EOSABS 0.1 06/29/2022 0432   BASOSABS 0.0 06/29/2022 0432    BMET    Component Value Date/Time   NA 136 06/30/2022 0314   K 3.8 06/30/2022 0314   CL 104 06/30/2022 0314   CO2 22 06/30/2022 0314   GLUCOSE 100 (H) 06/30/2022 0314   BUN 23 06/30/2022 0314   CREATININE 2.38 (H) 06/30/2022 0314   CALCIUM 8.4 (L) 06/30/2022 0314   GFRNONAA 30 (L) 06/30/2022 0314   GFRAA >60 08/19/2019 1417    BNP    Component Value Date/Time   BNP 40.5 04/08/2022 1021    ProBNP    Component Value Date/Time   PROBNP <30.0 12/05/2007 0447    Imaging: US RENAL  Result Date: 06/30/2022 CLINICAL DATA:  Acute kidney injury EXAM: RENAL / URINARY TRACT ULTRASOUND COMPLETE COMPARISON:  CT 06/26/2022 FINDINGS: Right Kidney: Renal measurements: 13.8 x 7.5 x 6 cm =  volume: 321 mL. Echogenicity within normal limits. No mass or hydronephrosis visualized. Left Kidney: Renal measurements: 11.2 x 6.2 x 5.3 cm = volume: 190 mL. Echogenicity within normal limits. No mass or hydronephrosis visualized. Bladder: Appears normal for degree of bladder distention. Other: None. IMPRESSION: Negative. No hydronephrosis. Electronically Signed   By: Lucrezia Europe M.D.   On: 06/30/2022 09:07   ECHOCARDIOGRAM LIMITED  Result Date: 06/27/2022    ECHOCARDIOGRAM REPORT   Patient Name:   SIR ROTZ [facility] Date of Exam: 06/27/2022 Medical Rec #:  [id number]       Height:       70.0 in Accession #:    [id number]      Weight:       211.9 lb Date of Birth:  18-Feb-1956       BSA:          2.139 m Patient Age:    39 years        BP:           117/60 mmHg Patient Gender: M               HR:           113 bpm. Exam Location:  Inpatient Procedure: Limited Echo, Color Doppler and Cardiac Doppler Indications:    Bacteremia  History:        Patient has prior history of Echocardiogram examinations, most                 recent 04/10/2022. Sepsis and COPD; Risk Factors:Hypertension,                 Dyslipidemia, Sleep Apnea and Former Smoker.  Sonographer:  Johny Chess RDCS Referring Phys: [id number] Candee Furbish  Sonographer Comments: Image acquisition challenging due to respiratory motion. IMPRESSIONS  1. Left ventricular ejection fraction, by estimation, is 60 to 65%. The left ventricle has normal function. The left ventricle has no regional wall motion abnormalities. Left ventricular diastolic function could not be evaluated.  2. Right ventricular systolic function is normal. The right ventricular size is normal. The estimated right ventricular systolic pressure is Q000111Q mmHg.  3. The mitral valve is normal in structure. No evidence of mitral valve regurgitation. No evidence of mitral stenosis.  4. The aortic valve is tricuspid. Aortic valve regurgitation is not visualized. Aortic valve  sclerosis/calcification is present, without any evidence of aortic stenosis.  5. The inferior vena cava is normal in size with greater than 50% respiratory variability, suggesting right atrial pressure of 3 mmHg. Conclusion(s)/Recommendation(s): No evidence of valvular vegetations on this transthoracic echocardiogram. Consider a transesophageal echocardiogram to exclude infective endocarditis if clinically indicated. FINDINGS  Left Ventricle: Left ventricular ejection fraction, by estimation, is 60 to 65%. The left ventricle has normal function. The left ventricle has no regional wall motion abnormalities. The left ventricular internal cavity size was normal in size. There is  no left ventricular hypertrophy. Left ventricular diastolic function could not be evaluated. Right Ventricle: The right ventricular size is normal. No increase in right ventricular wall thickness. Right ventricular systolic function is normal. The tricuspid regurgitant velocity is 2.31 m/s, and with an assumed right atrial pressure of 3 mmHg, the estimated right ventricular systolic pressure is Q000111Q mmHg. Left Atrium: Left atrial size was normal in size. Right Atrium: Right atrial size was normal in size. Pericardium: There is no evidence of pericardial effusion. Mitral Valve: The mitral valve is normal in structure. No evidence of mitral valve regurgitation. No evidence of mitral valve stenosis. Tricuspid Valve: The tricuspid valve is normal in structure. Tricuspid valve regurgitation is trivial. No evidence of tricuspid stenosis. Aortic Valve: The aortic valve is tricuspid. Aortic valve regurgitation is not visualized. Aortic valve sclerosis/calcification is present, without any evidence of aortic stenosis. Pulmonic Valve: The pulmonic valve was normal in structure. Pulmonic valve regurgitation is not visualized. No evidence of pulmonic stenosis. Aorta: The aortic root is normal in size and structure. Venous: The inferior vena cava is normal  in size with greater than 50% respiratory variability, suggesting right atrial pressure of 3 mmHg. IAS/Shunts: No atrial level shunt detected by color flow Doppler.  IVC IVC diam: 1.20 cm TRICUSPID VALVE TR Peak grad:   21.3 mmHg TR Vmax:        231.00 cm/s Fransico Him MD Electronically signed by Fransico Him MD Signature Date/Time: 06/27/2022/5:51:06 PM    Final    CT LUMBAR SPINE WO CONTRAST  Result Date: 06/27/2022 CLINICAL DATA:  Low back pain and new right lower extremity weakness. History of prior back surgery with recent surgery in November of 2023. EXAM: CT LUMBAR SPINE WITHOUT CONTRAST TECHNIQUE: Multidetector CT imaging of the lumbar spine was performed without intravenous contrast administration. Multiplanar CT image reconstructions were also generated. RADIATION DOSE REDUCTION: This exam was performed according to the departmental dose-optimization program which includes automated exposure control, adjustment of the mA and/or kV according to patient size and/or use of iterative reconstruction technique. COMPARISON:  CT scan 04/08/2022 FINDINGS: Segmentation: There are five lumbar type vertebral bodies. The last full intervertebral disc space is labeled L5-S1. Alignment: Normal overall alignment. The facets are normally aligned. Vertebrae: No bone lesions or fractures are  identified extensive postoperative changes with wide decompression laminectomies and partial facetectomies. Paraspinal and other soft tissues: No significant retroperitoneal findings. Age advanced aortic calcifications. Renal calculi noted. Disc levels: T12-L1: No significant spinal foraminal stenosis. L1-2: No disc protrusions, spinal foraminal stenosis. L2-3: Postoperative changes with posterior and interbody fusion hardware. The pedicle screws appear normal. No osseous interbody fusion changes are demonstrated yet. L3-4: Postoperative changes with posterior and interbody fusion hardware. Single right-sided interbody fusion device.  Partial right-sided facetectomy. No obvious spinal foraminal stenosis. L4-5: Unfortunately, the interbody fusion device has become dislodged and is now in the right neural foramen and likely causing the patient's right radicular symptoms. The L4 pedicle screws are intact. Right-sided facetectomy noted. L5-S1: The right pedicle screw is coursing along the inferior cortex of the pedicle and is partly in the right neural foramen. Wide decompressive laminectomy at this level. No spinal stenosis. Possible impingement on right L5 nerve root. IMPRESSION: 1. Postoperative changes with posterior and interbody fusion hardware at L2-L5. 2. The interbody fusion device at L4-5 has become dislodged and is now in the right neural foramen and likely causing the patient's right radicular symptoms. 3. The right pedicle screw at L5 is coursing along the inferior cortex of the pedicle and is partly in the right neural foramen. Possible impingement on the right L5 nerve root. 4. Aortic atherosclerosis. Aortic Atherosclerosis (ICD10-I70.0). Electronically Signed   By: Marijo Sanes M.D.   On: 06/27/2022 14:13   DG Swallowing Func-Speech Pathology  Result Date: 06/27/2022 Table formatting from the original result was not included. Modified Barium Swallow Study Patient Details Name: NAVDEEP WIEDERHOLT MRN: [id number] Date of Birth: 01-29-1956 Today's Date: 06/27/2022 HPI/PMH: HPI: Nahiem Mahoney is a 67 yo M who presented to the ED with complaints of shortness of breath, back pain, abdominal discomfort. Admitted to ICU with sepsis.  CXR 2/13 with no active disease.  Pt with a history of GERD, hyperlipidemia, gout, diabetes, obstructive sleep apnea, DVT, status post colectomy end ileostomy. He had a posterior cervical diskectomy fusion surgery in March. Clinical Impression: Clinical Impression: Pt exhibits a primary mild pharyngoesophageal phase dysphagia exacerbated by anatomical differences. Pt's C2-4/C5 appears to have a natural occuring  fusion with osteophytes particularly at  the level of the pharyngoesophageal segment. This results in decreased distention and trace residue in this area that pt can sense. He had only flash laryngeal penetration of thin liquid x 1 that spontaneously exited the vestibule during the swallow. He cleared his throat several times during the study without material seen in vestibule or trachea. He tends to have a piecemeal transit pattern to his liquids taking 2, sometimes 3 swallows to clear his oral cavity possibly in anticipation of pharyngeal clearance. There was trace vallecular and pyriform sinus residue post swallow. Esophageal scan was unremarkable. Pt educated to alternate solids and sips to facilitate pharyngoesophageal transit, ensure meats are moist (reports difficulty with steak) and small bites/sips and masticate food thoroughly. He does not report difficulty with pills therefore recommend single pills with thin. No further ST is needed. Factors that may increase risk of adverse event in presence of aspiration ([place] 2021): No data recorded Recommendations/Plan: Swallowing Evaluation Recommendations Swallowing Evaluation Recommendations Recommendations: PO diet PO Diet Recommendation: Regular; Thin liquids (Level 0) Liquid Administration via: Cup; Straw Medication Administration: Whole meds with liquid Supervision: Patient able to self-feed Swallowing strategies  : Follow solids with liquids; Small bites/sips; Slow rate Postural changes: Position pt fully upright for meals Oral care recommendations:  Oral care BID (2x/day) Treatment Plan Treatment Plan Treatment recommendations: No treatment recommended at this time Follow-up recommendations: No SLP follow up Functional status assessment: Patient has not had a recent decline in their functional status. Recommendations Recommendations for follow up therapy are one component of a multi-disciplinary discharge planning process, led by the attending  physician.  Recommendations may be updated based on patient status, additional functional criteria and insurance authorization. Assessment: Orofacial Exam: Orofacial Exam Oral Cavity: Oral Hygiene: WFL Oral Cavity - Dentition: Adequate natural dentition Orofacial Anatomy: WFL Oral Motor/Sensory Function: WFL Anatomy: Anatomy: Presence of cervical hardware; Suspected cervical osteophytes (suspected cervical fusion) Thin Liquids: Thin Liquids (Level 0) Thin Liquids : Impaired Bolus delivery method: Cup; Spoon; Straw Thin Liquid - Impairment: Oral Impairment; Pharyngeal impairment Lip Closure: No labial escape Tongue control during bolus hold: Cohesive bolus between tongue to palatal seal Bolus transport/lingual motion: Brisk tongue motion Oral residue: Trace residue lining oral structures Location of oral residue : Tongue Initiation of swallow : Valleculae Soft palate elevation: Complete Laryngeal elevation: Partial or minimal superior movement and approximation Anterior hyoid excursion: Complete Epiglottic movement: Complete Laryngeal vestibule closure: Incomplete Pharyngeal stripping wave : Present - diminished Pharyngeal contraction (A/P view only): N/A Pharyngoesophageal segment opening: Partial distention/partial duration, partial obstruction of flow Tongue base retraction: Trace column of contrast or air between tongue base and PPW Pharyngeal residue: Trace residue within or on pharyngeal structures Location of pharyngeal residue: Valleculae; Pyriform sinuses Penetration/Aspiration Scale (PAS) score: 2.  Material enters airway, remains ABOVE vocal cords then ejected out  Mildly Thick Liquids: Mildly thick liquids (Level 2, nectar thick) Mildly thick liquids (Level 2, nectar thick): Impaired Bolus delivery method: Spoon; Cup Mildly Thick Liquid - Impairment: Oral Impairment; Pharyngeal impairment Lip Closure: No labial escape Tongue control during bolus hold: Cohesive bolus between tongue to palatal seal Bolus  transport/lingual motion: Brisk tongue motion Oral residue: Residue collection on oral structures Location of oral residue : Tongue Initiation of swallow : Pyriform sinuses Soft palate elevation: Complete Laryngeal elevation: Complete superior movement of thyroid cartilage with complete approximation of arytenoids to epiglottic petiole Anterior hyoid excursion: Complete Epiglottic movement: Complete Laryngeal vestibule closure: Complete: No air/contrast in laryngeal vestibule Pharyngeal stripping wave : Present - diminished Pharyngeal contraction (A/P view only): N/A Pharyngoesophageal segment opening: Partial distention/partial duration, partial obstruction of flow Tongue base retraction: Trace column of contrast or air between tongue base and PPW Pharyngeal residue: Trace residue within or on pharyngeal structures Location of pharyngeal residue: Valleculae; Pyriform sinuses Penetration/Aspiration Scale (PAS) score: 1.  Material does not enter airway  Moderately Thick Liquids: Moderately thick liquids (Level 3, honey thick) Moderately thick liquids (Level 3, honey thick): Impaired Bolus delivery method: Spoon Moderately Thick Liquid - Impairment: Oral Impairment; Pharyngeal impairment Lip Closure: No labial escape Tongue control during bolus hold: Cohesive bolus between tongue to palatal seal Bolus transport/lingual motion: Brisk tongue motion Oral residue: Trace residue lining oral structures Location of oral residue : Tongue Initiation of swallow : Valleculae Soft palate elevation: Complete Laryngeal elevation: Complete superior movement of thyroid cartilage with complete approximation of arytenoids to epiglottic petiole Anterior hyoid excursion: Complete Epiglottic movement: Complete Laryngeal vestibule closure: Complete: No air/contrast in laryngeal vestibule Pharyngeal stripping wave : Present - complete Pharyngeal contraction (A/P view only): N/A Pharyngoesophageal segment opening: Partial distention/partial  duration, partial obstruction of flow Tongue base retraction: No contrast between tongue base and posterior pharyngeal wall (PPW) Pharyngeal residue: Trace residue within or on pharyngeal structures Location of pharyngeal  residue: Valleculae; Pyriform sinuses Penetration/Aspiration Scale (PAS) score: 1.  Material does not enter airway  Puree: Puree Puree: Impaired Puree - Impairment: Oral Impairment; Pharyngeal impairment Lip Closure: No labial escape Bolus transport/lingual motion: Brisk tongue motion Oral residue: Trace residue lining oral structures Location of oral residue : Tongue Initiation of swallow: Pyriform sinuses Soft palate elevation: Complete Laryngeal elevation: Partial or minimal superior movement and approximation Anterior hyoid excursion: Complete Epiglottic movement: Partial Laryngeal vestibule closure: Complete: No air/contrast in laryngeal vestibule Pharyngeal stripping wave : Present - diminished Pharyngeal contraction (A/P view only): N/A Pharyngoesophageal segment opening: Partial distention/partial duration, partial obstruction of flow Tongue base retraction: No contrast between tongue base and posterior pharyngeal wall (PPW) Pharyngeal residue: Trace residue within or on pharyngeal structures Location of pharyngeal residue: Aryepiglottic folds Penetration/Aspiration Scale (PAS) score: 1.  Material does not enter airway Solid: Solid Solid: Impaired Solid - Impairment: Oral Impairment; Pharyngeal impairment Lip Closure: No labial escape Bolus preparation/mastication: Timely and efficient chewing and mashing Bolus transport/lingual motion: Brisk tongue motion Oral residue: Residue collection on oral structures Location of oral residue : Tongue Initiation of swallow: Valleculae Soft palate elevation: Complete Laryngeal elevation: Complete superior movement of thyroid cartilage with complete approximation of arytenoids to epiglottic petiole Anterior hyoid excursion: Complete Epiglottic movement:  Complete Laryngeal vestibule closure: Complete: No air/contrast in laryngeal vestibule Pharyngeal stripping wave : Present - diminished Pharyngeal contraction (A/P view only): N/A Pharyngoesophageal segment opening: Partial distention/partial duration, partial obstruction of flow Tongue base retraction: No contrast between tongue base and posterior pharyngeal wall (PPW) Pharyngeal residue: Complete pharyngeal clearance Penetration/Aspiration Scale (PAS) score: 1.  Material does not enter airway Pill: Pill Pill: Not Tested Compensatory Strategies: No data recorded  General Information: No data recorded Diet Prior to this Study: Regular; Thin liquids (Level 0)   Temperature : Normal   Respiratory Status: WFL   Supplemental O2: None (Room air)   History of Recent Intubation: No  Behavior/Cognition: Alert; Cooperative; Pleasant mood Self-Feeding Abilities: Able to self-feed Baseline vocal quality/speech: Normal Volitional Cough: Able to elicit Volitional Swallow: Able to elicit No data recorded Goal Planning: Prognosis for improved oropharyngeal function: Good No data recorded No data recorded Patient/Family Stated Goal: Comfortably drink water Consulted and agree with results and recommendations: Patient Pain: Pain Assessment Pain Assessment: Faces Pain Score: 4 Faces Pain Scale: 0 Facial Expression: 0 Body Movements: 0 Muscle Tension: 0 Compliance with ventilator (intubated pts.): N/A Vocalization (extubated pts.): 0 CPOT Total: 0 Pain Location: abdomen, low back Pain Descriptors / Indicators: Sore Pain Intervention(s): Monitored during session; Repositioned End of Session: Start Time:SLP Start Time (ACUTE ONLY): 1249 Stop Time: SLP Stop Time (ACUTE ONLY): 1302 Time Calculation:SLP Time Calculation (min) (ACUTE ONLY): 13 min Charges: SLP Evaluations $ SLP Speech Visit: 1 Visit SLP Evaluations $BSS Swallow: 1 Procedure $MBS Swallow: 1 Procedure SLP visit diagnosis: SLP Visit Diagnosis: Dysphagia, pharyngoesophageal  phase (R13.14); Dysphagia, oral phase (R13.11) Past Medical History: Past Medical History: Diagnosis Date  Allergy   Arthritis   osteo  CTS (carpal tunnel syndrome)   DJD (degenerative joint disease)   DM2 (diabetes mellitus, type 2) (HCC)   DVT (deep venous thrombosis) (HCC)   GERD (gastroesophageal reflux disease)   Gout   History of adenomatous polyps of colon 12/29/2019  History of nuclear stress test   Myoview 11/17: EF 54, no ST changes, hypertensive blood pressure response, no ischemia, low risk  Hyperlipidemia   Hypothyroidism   Nicotine addiction   OSA (obstructive sleep apnea)  Prostatitis   Reactive airway disease   Sarcocystosis   Sinus arrhythmia   Sleep apnea   cpap- not wearing  Tinea corporis  Past Surgical History: Past Surgical History: Procedure Laterality Date  BILATERAL KNEE ARTHROSCOPY    right- 1997; left 2001  COLONOSCOPY  multiple last 2013  KNEE SURGERY Right 2014  torn R medial meniscus- arthroscopy done  LAPAROTOMY N/A 04/10/2022  Procedure: EXPLORATORY LAPAROTOMY, TOTAL COLECTOMY, END ILEOSTOMY;  Surgeon: Felicie Morn, MD;  Location: [facility];  Service: General;  Laterality: N/A;  PARATHYROIDECTOMY  2005  ROTATOR CUFF REPAIR  2004  right  TONSILLECTOMY    TOTAL HIP ARTHROPLASTY Left 2009  TOTAL HIP ARTHROPLASTY Right 2006  UPPER GASTROINTESTINAL ENDOSCOPY   Houston Siren 06/27/2022, 2:11 PM  CT ABDOMEN PELVIS WO CONTRAST  Result Date: 06/26/2022 CLINICAL DATA:  Acute nonlocalized abdominal pain. Sepsis. Acute kidney injury. Back pain and shortness of breath beginning Sunday. Colon resection on 11/23. EXAM: CT ABDOMEN AND PELVIS WITHOUT CONTRAST TECHNIQUE: Multidetector CT imaging of the abdomen and pelvis was performed following the standard protocol without IV contrast. RADIATION DOSE REDUCTION: This exam was performed according to the departmental dose-optimization program which includes automated exposure control, adjustment of the mA and/or kV according to patient  size and/or use of iterative reconstruction technique. COMPARISON:  04/10/2022 FINDINGS: Lower chest: Atelectasis or consolidation in the right lung base. Minimal right pleural effusion. These changes are improved since prior study. Calcification in the coronary arteries. Hepatobiliary: No focal liver abnormality is seen. No gallstones, gallbladder wall thickening, or biliary dilatation. Pancreas: Unremarkable. No pancreatic ductal dilatation or surrounding inflammatory changes. Spleen: Normal in size without focal abnormality. Adrenals/Urinary Tract: No adrenal gland nodules. Several punctate sized stones in both kidneys, largest measuring about 2 mm diameter. No hydronephrosis or hydroureter. Bladder is decompressed but the bladder wall appears diffusely thickened. This could indicate cystitis or outlet obstruction. Stomach/Bowel: Stomach is decompressed. Gastric wall appears thickened but this could just be due to under distention. Consider possibility of gastritis. Small bowel are not abnormally distended. Postoperative changes with partial colectomy, right lower quadrant ileostomy, and sigmoid colon stump. Small bowel and remaining colon are not abnormally distended. Vascular/Lymphatic: Calcification of the aorta. No aneurysm. No significant lymphadenopathy. Reproductive: Prostate gland is diffusely enlarged. Other: Residual scarring and edema in the right lower quadrant and right pararenal spaces, likely postoperative. The previous hematoma seen in this area has been resected or resolved in the interval. Minimal residual subcapsular liver fluid collection. Resolution of previous perisplenic fluid. Musculoskeletal: Bilateral hip arthroplasties. Postoperative fixation of the lumbar spine. Degenerative changes in the lumbar spine and SI joints. IMPRESSION: 1. Interval improvement since prior study with resection or resorption of the previous right lower quadrant hematoma. Mild residual stranding and edema in this  area today is likely postoperative. 2. Small residual right pleural effusion and basilar atelectasis/consolidation, improved since prior study. 3. Multiple bilateral nonobstructing intrarenal stones. 4. Gastric wall appears thickened, possibly due to under distention or possibly gastritis. 5. Bladder wall is thickened, possibly due to cystitis or outlet obstruction. The prostate gland is enlarged. 6. Interval postoperative changes with partial colectomy and right lower quadrant ileostomy with sigmoid colon stump. 7. Aortic atherosclerosis. Electronically Signed   By: Lucienne Capers M.D.   On: 06/26/2022 17:50   DG Chest Portable 1 View  Result Date: 06/26/2022 CLINICAL DATA:  Shortness of breath EXAM: PORTABLE CHEST 1 VIEW COMPARISON:  04/17/2022, 08/19/2019 FINDINGS: Chronic elevation of the right diaphragm. Subsegmental  atelectasis right base. No acute airspace disease or pleural effusion. Small clips over the left upper mediastinum as before. Cardiac size within normal limits. No pneumothorax IMPRESSION: No active disease. Chronic elevation of the right diaphragm with subsegmental atelectasis at the right base. Electronically Signed   By: Donavan Foil M.D.   On: 06/26/2022 15:28     Assessment & Plan:   PE (pulmonary thromboembolism) ([place]) - No evidence of right heart strain on most recent echo from an admission in February 2024. Continue long term anticoagulation with Eliquis   Chronic obstructive pulmonary disease ([place]) - Former smoker. He is minimally symptomatic, has occasional dyspnea with exertion. Overall doing well respiratory wise. We will get baseline pulmonary function testing to assess for COPD  Obstructive sleep apnea (adult) (pediatric) - Hx sleep apnea, intolerant to CPAP in the past. He continues to have loud snoring symptoms. We will check sleep study to reassess sleep apnea, after testing consider ENT referral for possible inspire device due to CPAP failure  Acute  respiratory failure with hypoxia ([place]) - Resolved. No oxygen requirements   Martyn Ehrich, NP 07/23/2022

## 2022-07-21 DIAGNOSIS — M4726 Other spondylosis with radiculopathy, lumbar region: Secondary | ICD-10-CM | POA: Diagnosis not present

## 2022-07-21 DIAGNOSIS — I2699 Other pulmonary embolism without acute cor pulmonale: Secondary | ICD-10-CM | POA: Diagnosis not present

## 2022-07-21 DIAGNOSIS — Z4789 Encounter for other orthopedic aftercare: Secondary | ICD-10-CM | POA: Diagnosis not present

## 2022-07-21 DIAGNOSIS — E119 Type 2 diabetes mellitus without complications: Secondary | ICD-10-CM | POA: Diagnosis not present

## 2022-07-21 DIAGNOSIS — I82411 Acute embolism and thrombosis of right femoral vein: Secondary | ICD-10-CM | POA: Diagnosis not present

## 2022-07-21 DIAGNOSIS — Z432 Encounter for attention to ileostomy: Secondary | ICD-10-CM | POA: Diagnosis not present

## 2022-07-23 DIAGNOSIS — Z432 Encounter for attention to ileostomy: Secondary | ICD-10-CM | POA: Diagnosis not present

## 2022-07-23 DIAGNOSIS — I2699 Other pulmonary embolism without acute cor pulmonale: Secondary | ICD-10-CM | POA: Diagnosis not present

## 2022-07-23 DIAGNOSIS — E119 Type 2 diabetes mellitus without complications: Secondary | ICD-10-CM | POA: Diagnosis not present

## 2022-07-23 DIAGNOSIS — M4726 Other spondylosis with radiculopathy, lumbar region: Secondary | ICD-10-CM | POA: Diagnosis not present

## 2022-07-23 DIAGNOSIS — Z4789 Encounter for other orthopedic aftercare: Secondary | ICD-10-CM | POA: Diagnosis not present

## 2022-07-23 DIAGNOSIS — I82411 Acute embolism and thrombosis of right femoral vein: Secondary | ICD-10-CM | POA: Diagnosis not present

## 2022-07-23 NOTE — Assessment & Plan Note (Signed)
-   Former smoker. He is minimally symptomatic, has occasional dyspnea with exertion. Overall doing well respiratory wise. We will get baseline pulmonary function testing to assess for COPD

## 2022-07-23 NOTE — Assessment & Plan Note (Addendum)
-   No evidence of right heart strain on most recent echo from an admission in February 2024. Continue long term anticoagulation with Eliquis

## 2022-07-23 NOTE — Assessment & Plan Note (Signed)
-   Hx sleep apnea, intolerant to CPAP in the past. He continues to have loud snoring symptoms. We will check sleep study to reassess sleep apnea, after testing consider ENT referral for possible inspire device due to CPAP failure

## 2022-07-23 NOTE — Assessment & Plan Note (Signed)
-   Resolved. No oxygen requirements

## 2022-07-30 DIAGNOSIS — G4733 Obstructive sleep apnea (adult) (pediatric): Secondary | ICD-10-CM | POA: Diagnosis not present

## 2022-07-30 DIAGNOSIS — I110 Hypertensive heart disease with heart failure: Secondary | ICD-10-CM | POA: Diagnosis not present

## 2022-07-30 DIAGNOSIS — Z932 Ileostomy status: Secondary | ICD-10-CM | POA: Diagnosis not present

## 2022-07-30 DIAGNOSIS — R634 Abnormal weight loss: Secondary | ICD-10-CM | POA: Diagnosis not present

## 2022-07-30 DIAGNOSIS — N289 Disorder of kidney and ureter, unspecified: Secondary | ICD-10-CM | POA: Diagnosis not present

## 2022-07-30 DIAGNOSIS — Z86711 Personal history of pulmonary embolism: Secondary | ICD-10-CM | POA: Diagnosis not present

## 2022-07-30 DIAGNOSIS — D649 Anemia, unspecified: Secondary | ICD-10-CM | POA: Diagnosis not present

## 2022-07-30 DIAGNOSIS — M5416 Radiculopathy, lumbar region: Secondary | ICD-10-CM | POA: Diagnosis not present

## 2022-07-30 DIAGNOSIS — D509 Iron deficiency anemia, unspecified: Secondary | ICD-10-CM | POA: Diagnosis not present

## 2022-07-31 DIAGNOSIS — E119 Type 2 diabetes mellitus without complications: Secondary | ICD-10-CM | POA: Diagnosis not present

## 2022-07-31 DIAGNOSIS — M4726 Other spondylosis with radiculopathy, lumbar region: Secondary | ICD-10-CM | POA: Diagnosis not present

## 2022-07-31 DIAGNOSIS — M960 Pseudarthrosis after fusion or arthrodesis: Secondary | ICD-10-CM | POA: Diagnosis not present

## 2022-07-31 DIAGNOSIS — M48062 Spinal stenosis, lumbar region with neurogenic claudication: Secondary | ICD-10-CM | POA: Diagnosis not present

## 2022-07-31 DIAGNOSIS — T84296A Other mechanical complication of internal fixation device of vertebrae, initial encounter: Secondary | ICD-10-CM | POA: Diagnosis not present

## 2022-07-31 DIAGNOSIS — I82411 Acute embolism and thrombosis of right femoral vein: Secondary | ICD-10-CM | POA: Diagnosis not present

## 2022-07-31 DIAGNOSIS — I2699 Other pulmonary embolism without acute cor pulmonale: Secondary | ICD-10-CM | POA: Diagnosis not present

## 2022-07-31 DIAGNOSIS — Z432 Encounter for attention to ileostomy: Secondary | ICD-10-CM | POA: Diagnosis not present

## 2022-07-31 DIAGNOSIS — M5431 Sciatica, right side: Secondary | ICD-10-CM | POA: Diagnosis not present

## 2022-07-31 DIAGNOSIS — Z4789 Encounter for other orthopedic aftercare: Secondary | ICD-10-CM | POA: Diagnosis not present

## 2022-08-01 ENCOUNTER — Ambulatory Visit (HOSPITAL_BASED_OUTPATIENT_CLINIC_OR_DEPARTMENT_OTHER): Payer: Medicare Other | Attending: Primary Care | Admitting: Pulmonary Disease

## 2022-08-01 DIAGNOSIS — I480 Paroxysmal atrial fibrillation: Secondary | ICD-10-CM | POA: Diagnosis not present

## 2022-08-01 DIAGNOSIS — G478 Other sleep disorders: Secondary | ICD-10-CM | POA: Diagnosis not present

## 2022-08-01 DIAGNOSIS — Z8669 Personal history of other diseases of the nervous system and sense organs: Secondary | ICD-10-CM | POA: Diagnosis not present

## 2022-08-01 DIAGNOSIS — R0683 Snoring: Secondary | ICD-10-CM | POA: Diagnosis not present

## 2022-08-02 DIAGNOSIS — R0683 Snoring: Secondary | ICD-10-CM

## 2022-08-02 DIAGNOSIS — Z432 Encounter for attention to ileostomy: Secondary | ICD-10-CM | POA: Diagnosis not present

## 2022-08-02 DIAGNOSIS — Z4789 Encounter for other orthopedic aftercare: Secondary | ICD-10-CM | POA: Diagnosis not present

## 2022-08-02 DIAGNOSIS — I82411 Acute embolism and thrombosis of right femoral vein: Secondary | ICD-10-CM | POA: Diagnosis not present

## 2022-08-02 DIAGNOSIS — I2699 Other pulmonary embolism without acute cor pulmonale: Secondary | ICD-10-CM | POA: Diagnosis not present

## 2022-08-02 DIAGNOSIS — M4726 Other spondylosis with radiculopathy, lumbar region: Secondary | ICD-10-CM | POA: Diagnosis not present

## 2022-08-02 DIAGNOSIS — E119 Type 2 diabetes mellitus without complications: Secondary | ICD-10-CM | POA: Diagnosis not present

## 2022-08-02 NOTE — Procedures (Signed)
Patient Name: Kyle Delacruz, Kyle Delacruz Date: 08/01/2022 Gender: Male D.O.B: 1955/11/20 Age (years): 32 Referring Provider: Geraldo Pitter NP Height (inches): 70 Interpreting Physician: Kara Mead MD, ABSM Weight (lbs): 195 RPSGT: Laren Everts BMI: 28 MRN: [id number] Neck Size: 15.00 <br> <br> CLINICAL INFORMATION Sleep Study Type: NPSG    Indication for sleep study: COPD, Hypertension, Witnesses Apnea / Gasping During Sleep    Epworth Sleepiness Score: 4    SLEEP STUDY TECHNIQUE As per the AASM Manual for the Scoring of Sleep and Associated Events v2.3 (April 2016) with a hypopnea requiring 4% desaturations.  The channels recorded and monitored were frontal, central and occipital EEG, electrooculogram (EOG), submentalis EMG (chin), nasal and oral airflow, thoracic and abdominal wall motion, anterior tibialis EMG, snore microphone, electrocardiogram, and pulse oximetry.  MEDICATIONS Medications self-administered by patient taken the night of the study : ALLOPURINOL, Eliquis, METOPROLOL, OMEPRAZOLE  SLEEP ARCHITECTURE The study was initiated at 10:35:15 PM and ended at 4:55:39 AM.  Sleep onset time was 16.8 minutes and the sleep efficiency was 51.8%. The total sleep time was 197.1 minutes.  Stage REM latency was 122.5 minutes.  The patient spent 28.2% of the night in stage N1 sleep, 57.1% in stage N2 sleep, 0.0% in stage N3 and 14.7% in REM.  Alpha intrusion was absent.  Supine sleep was 0.00%.  RESPIRATORY PARAMETERS The overall apnea/hypopnea index (AHI) was 2.4 per hour. There were 0 total apneas, including 0 obstructive, 0 central and 0 mixed apneas. There were 8 hypopneas and 71 RERAs with RDI 24/h  The AHI during Stage REM sleep was 12.4 per hour.  AHI while supine was N/A per hour.  The mean oxygen saturation was 96.1%. The minimum SpO2 during sleep was 87.0%.  moderate snoring was noted during this study.  CARDIAC DATA The 2 lead EKG demonstrated  sinus rhythm. The mean heart rate was 59.3 beats per minute. Other EKG findings include: PVCs.   LEG MOVEMENT DATA The total PLMS were 0 with a resulting PLMS index of 0.0. Associated arousal with leg movement index was 0.6 .  IMPRESSIONS - No significant obstructive sleep apnea occurred during this study (AHI = 2.4/h) but significanr RERAs with RDI 24/h. Note that this study was performed with him sleeping in a recliner due to back issues. - Mild oxygen desaturation was noted during this study (Min O2 = 87.0%). - The patient snored with moderate snoring volume. - EKG findings include PVCs. - Clinically significant periodic limb movements did not occur during sleep. No significant associated arousals.   DIAGNOSIS - Upper airway resistance syndrome   RECOMMENDATIONS - Avoid alcohol, sedatives and other CNS depressants that may worsen sleep apnea and disrupt normal sleep architecture. - Sleep hygiene should be reviewed to assess factors that may improve sleep quality. - Weight management and regular exercise should be initiated or continued if appropriate. - Note that this study was performed with him sleeping in a recliner due to back issues. Severity may be worse if he were to lie supine in bed.    Kara Mead MD Board Certified in [place]

## 2022-08-05 DIAGNOSIS — I2699 Other pulmonary embolism without acute cor pulmonale: Secondary | ICD-10-CM | POA: Diagnosis not present

## 2022-08-05 DIAGNOSIS — M48062 Spinal stenosis, lumbar region with neurogenic claudication: Secondary | ICD-10-CM | POA: Diagnosis not present

## 2022-08-05 DIAGNOSIS — K219 Gastro-esophageal reflux disease without esophagitis: Secondary | ICD-10-CM | POA: Diagnosis not present

## 2022-08-05 DIAGNOSIS — J449 Chronic obstructive pulmonary disease, unspecified: Secondary | ICD-10-CM | POA: Diagnosis not present

## 2022-08-05 DIAGNOSIS — M5431 Sciatica, right side: Secondary | ICD-10-CM | POA: Diagnosis not present

## 2022-08-05 DIAGNOSIS — M4722 Other spondylosis with radiculopathy, cervical region: Secondary | ICD-10-CM | POA: Diagnosis not present

## 2022-08-05 DIAGNOSIS — I10 Essential (primary) hypertension: Secondary | ICD-10-CM | POA: Diagnosis not present

## 2022-08-05 DIAGNOSIS — M1990 Unspecified osteoarthritis, unspecified site: Secondary | ICD-10-CM | POA: Diagnosis not present

## 2022-08-05 DIAGNOSIS — E119 Type 2 diabetes mellitus without complications: Secondary | ICD-10-CM | POA: Diagnosis not present

## 2022-08-05 DIAGNOSIS — N179 Acute kidney failure, unspecified: Secondary | ICD-10-CM | POA: Diagnosis not present

## 2022-08-05 DIAGNOSIS — I2510 Atherosclerotic heart disease of native coronary artery without angina pectoris: Secondary | ICD-10-CM | POA: Diagnosis not present

## 2022-08-05 DIAGNOSIS — E7800 Pure hypercholesterolemia, unspecified: Secondary | ICD-10-CM | POA: Diagnosis not present

## 2022-08-05 DIAGNOSIS — M4726 Other spondylosis with radiculopathy, lumbar region: Secondary | ICD-10-CM | POA: Diagnosis not present

## 2022-08-05 DIAGNOSIS — Z87440 Personal history of urinary (tract) infections: Secondary | ICD-10-CM | POA: Diagnosis not present

## 2022-08-05 DIAGNOSIS — I480 Paroxysmal atrial fibrillation: Secondary | ICD-10-CM | POA: Diagnosis not present

## 2022-08-05 DIAGNOSIS — E559 Vitamin D deficiency, unspecified: Secondary | ICD-10-CM | POA: Diagnosis not present

## 2022-08-05 DIAGNOSIS — E039 Hypothyroidism, unspecified: Secondary | ICD-10-CM | POA: Diagnosis not present

## 2022-08-05 DIAGNOSIS — K589 Irritable bowel syndrome without diarrhea: Secondary | ICD-10-CM | POA: Diagnosis not present

## 2022-08-05 DIAGNOSIS — D638 Anemia in other chronic diseases classified elsewhere: Secondary | ICD-10-CM | POA: Diagnosis not present

## 2022-08-05 DIAGNOSIS — M109 Gout, unspecified: Secondary | ICD-10-CM | POA: Diagnosis not present

## 2022-08-05 DIAGNOSIS — Z432 Encounter for attention to ileostomy: Secondary | ICD-10-CM | POA: Diagnosis not present

## 2022-08-05 DIAGNOSIS — M5432 Sciatica, left side: Secondary | ICD-10-CM | POA: Diagnosis not present

## 2022-08-05 DIAGNOSIS — I82411 Acute embolism and thrombosis of right femoral vein: Secondary | ICD-10-CM | POA: Diagnosis not present

## 2022-08-05 DIAGNOSIS — K5981 Ogilvie syndrome: Secondary | ICD-10-CM | POA: Diagnosis not present

## 2022-08-05 DIAGNOSIS — G4733 Obstructive sleep apnea (adult) (pediatric): Secondary | ICD-10-CM | POA: Diagnosis not present

## 2022-08-07 DIAGNOSIS — I480 Paroxysmal atrial fibrillation: Secondary | ICD-10-CM | POA: Diagnosis not present

## 2022-08-07 DIAGNOSIS — J449 Chronic obstructive pulmonary disease, unspecified: Secondary | ICD-10-CM | POA: Diagnosis not present

## 2022-08-07 DIAGNOSIS — Z981 Arthrodesis status: Secondary | ICD-10-CM | POA: Diagnosis not present

## 2022-08-07 DIAGNOSIS — N179 Acute kidney failure, unspecified: Secondary | ICD-10-CM | POA: Diagnosis not present

## 2022-08-07 DIAGNOSIS — I2699 Other pulmonary embolism without acute cor pulmonale: Secondary | ICD-10-CM | POA: Diagnosis not present

## 2022-08-07 DIAGNOSIS — M48062 Spinal stenosis, lumbar region with neurogenic claudication: Secondary | ICD-10-CM | POA: Diagnosis not present

## 2022-08-07 DIAGNOSIS — E119 Type 2 diabetes mellitus without complications: Secondary | ICD-10-CM | POA: Diagnosis not present

## 2022-08-07 DIAGNOSIS — Z432 Encounter for attention to ileostomy: Secondary | ICD-10-CM | POA: Diagnosis not present

## 2022-08-08 DIAGNOSIS — E119 Type 2 diabetes mellitus without complications: Secondary | ICD-10-CM | POA: Diagnosis not present

## 2022-08-08 DIAGNOSIS — Z432 Encounter for attention to ileostomy: Secondary | ICD-10-CM | POA: Diagnosis not present

## 2022-08-08 DIAGNOSIS — N179 Acute kidney failure, unspecified: Secondary | ICD-10-CM | POA: Diagnosis not present

## 2022-08-08 DIAGNOSIS — I2699 Other pulmonary embolism without acute cor pulmonale: Secondary | ICD-10-CM | POA: Diagnosis not present

## 2022-08-08 DIAGNOSIS — I480 Paroxysmal atrial fibrillation: Secondary | ICD-10-CM | POA: Diagnosis not present

## 2022-08-08 DIAGNOSIS — J449 Chronic obstructive pulmonary disease, unspecified: Secondary | ICD-10-CM | POA: Diagnosis not present

## 2022-08-14 DIAGNOSIS — I2699 Other pulmonary embolism without acute cor pulmonale: Secondary | ICD-10-CM | POA: Diagnosis not present

## 2022-08-14 DIAGNOSIS — J449 Chronic obstructive pulmonary disease, unspecified: Secondary | ICD-10-CM | POA: Diagnosis not present

## 2022-08-14 DIAGNOSIS — E119 Type 2 diabetes mellitus without complications: Secondary | ICD-10-CM | POA: Diagnosis not present

## 2022-08-14 DIAGNOSIS — Z432 Encounter for attention to ileostomy: Secondary | ICD-10-CM | POA: Diagnosis not present

## 2022-08-14 DIAGNOSIS — N179 Acute kidney failure, unspecified: Secondary | ICD-10-CM | POA: Diagnosis not present

## 2022-08-14 DIAGNOSIS — I480 Paroxysmal atrial fibrillation: Secondary | ICD-10-CM | POA: Diagnosis not present

## 2022-08-15 DIAGNOSIS — J449 Chronic obstructive pulmonary disease, unspecified: Secondary | ICD-10-CM | POA: Diagnosis not present

## 2022-08-15 DIAGNOSIS — E119 Type 2 diabetes mellitus without complications: Secondary | ICD-10-CM | POA: Diagnosis not present

## 2022-08-15 DIAGNOSIS — N179 Acute kidney failure, unspecified: Secondary | ICD-10-CM | POA: Diagnosis not present

## 2022-08-15 DIAGNOSIS — I480 Paroxysmal atrial fibrillation: Secondary | ICD-10-CM | POA: Diagnosis not present

## 2022-08-15 DIAGNOSIS — I2699 Other pulmonary embolism without acute cor pulmonale: Secondary | ICD-10-CM | POA: Diagnosis not present

## 2022-08-15 DIAGNOSIS — Z432 Encounter for attention to ileostomy: Secondary | ICD-10-CM | POA: Diagnosis not present

## 2022-08-17 NOTE — Progress Notes (Addendum)
Fax received from [facility] on 07/31/22 for medical clearance/medication hold for revision L2-L5 fusion w/ L4-L5 TLIF to be signed by Dr. Randie Heinz. The pt needs IVC filter before surgery.  Provider signed on 08/16/22 to schedule IVC filter insertion. Georgianne Fick, RN in surgery scheduling made aware.  Called Dr. Hadassah Pais office for projected date of back surgery, no answer, lf vm.

## 2022-08-20 DIAGNOSIS — I480 Paroxysmal atrial fibrillation: Secondary | ICD-10-CM | POA: Diagnosis not present

## 2022-08-20 DIAGNOSIS — Z432 Encounter for attention to ileostomy: Secondary | ICD-10-CM | POA: Diagnosis not present

## 2022-08-20 DIAGNOSIS — J449 Chronic obstructive pulmonary disease, unspecified: Secondary | ICD-10-CM | POA: Diagnosis not present

## 2022-08-20 DIAGNOSIS — N179 Acute kidney failure, unspecified: Secondary | ICD-10-CM | POA: Diagnosis not present

## 2022-08-20 DIAGNOSIS — E119 Type 2 diabetes mellitus without complications: Secondary | ICD-10-CM | POA: Diagnosis not present

## 2022-08-20 DIAGNOSIS — I2699 Other pulmonary embolism without acute cor pulmonale: Secondary | ICD-10-CM | POA: Diagnosis not present

## 2022-08-22 ENCOUNTER — Other Ambulatory Visit: Payer: Self-pay

## 2022-08-22 ENCOUNTER — Telehealth: Payer: Self-pay

## 2022-08-22 DIAGNOSIS — Z86718 Personal history of other venous thrombosis and embolism: Secondary | ICD-10-CM

## 2022-08-22 NOTE — Telephone Encounter (Signed)
Kyle Delacruz returned call and advised filter needs to be placed 2 weeks prior. Advised her I will contact patient to schedule. She voiced understanding.    Spoke with patient and scheduled procedure for 5/6. Instructions provided and he verbalized understanding.

## 2022-08-22 NOTE — Telephone Encounter (Signed)
Spoke with Ladona Ridgel at Dr. Precious Gilding office. States patient is scheduled for surgery on 5/20. She will inquire how much in advance provider would like IVC filter placed prior to surgery and contact our office back with this information.

## 2022-08-23 DIAGNOSIS — Z432 Encounter for attention to ileostomy: Secondary | ICD-10-CM | POA: Diagnosis not present

## 2022-08-23 DIAGNOSIS — J449 Chronic obstructive pulmonary disease, unspecified: Secondary | ICD-10-CM | POA: Diagnosis not present

## 2022-08-23 DIAGNOSIS — N179 Acute kidney failure, unspecified: Secondary | ICD-10-CM | POA: Diagnosis not present

## 2022-08-23 DIAGNOSIS — E119 Type 2 diabetes mellitus without complications: Secondary | ICD-10-CM | POA: Diagnosis not present

## 2022-08-23 DIAGNOSIS — I480 Paroxysmal atrial fibrillation: Secondary | ICD-10-CM | POA: Diagnosis not present

## 2022-08-23 DIAGNOSIS — I2699 Other pulmonary embolism without acute cor pulmonale: Secondary | ICD-10-CM | POA: Diagnosis not present

## 2022-08-27 ENCOUNTER — Telehealth: Payer: Self-pay

## 2022-08-27 NOTE — Telephone Encounter (Signed)
Pt called to let us know his spine surgery date was pushed back to 11/05/22 due to his move. Per Ladona Ridgel at Dr. Hadassah Pais office, pt does not need to r/s his IVC filter date. Pt is aware and pre op instructions were reviewed; no further questions/concerns at this time.

## 2022-08-28 NOTE — Progress Notes (Signed)
Please let patient know sleep study did not show significant sleep apnea, mild oxygen desaturation. He was sleeping in recliner during study- recommend he continue to sleep with head elevated/in recliner. Avoid alcohol or sedatives prior to bedtime. Follow-up as indicated with our office

## 2022-08-29 DIAGNOSIS — J449 Chronic obstructive pulmonary disease, unspecified: Secondary | ICD-10-CM | POA: Diagnosis not present

## 2022-08-29 DIAGNOSIS — I480 Paroxysmal atrial fibrillation: Secondary | ICD-10-CM | POA: Diagnosis not present

## 2022-08-29 DIAGNOSIS — Z432 Encounter for attention to ileostomy: Secondary | ICD-10-CM | POA: Diagnosis not present

## 2022-08-29 DIAGNOSIS — N179 Acute kidney failure, unspecified: Secondary | ICD-10-CM | POA: Diagnosis not present

## 2022-08-29 DIAGNOSIS — I2699 Other pulmonary embolism without acute cor pulmonale: Secondary | ICD-10-CM | POA: Diagnosis not present

## 2022-08-29 DIAGNOSIS — E119 Type 2 diabetes mellitus without complications: Secondary | ICD-10-CM | POA: Diagnosis not present

## 2022-08-29 NOTE — Telephone Encounter (Signed)
This may because sleep study was done in recliner. Severity may be worse if he were to lie supine in bed. Positional sleep can correct sleep apnea. He can meet with me but this sleep study was accurate. No indication for cpap.

## 2022-08-31 DIAGNOSIS — Z933 Colostomy status: Secondary | ICD-10-CM | POA: Diagnosis not present

## 2022-08-31 DIAGNOSIS — Z9889 Other specified postprocedural states: Secondary | ICD-10-CM | POA: Diagnosis not present

## 2022-09-13 ENCOUNTER — Telehealth: Payer: Self-pay

## 2022-09-13 NOTE — Telephone Encounter (Signed)
Received a voicemail message from patient who is scheduled to have an IVC filter placed on 5/6 at the request of Dr. Precious Gilding, prior to his spine surgery, now on 6/24. Patient is requesting additional information regarding the procedure and risk associated. Message forwarded to Dr. Randie Heinz.

## 2022-09-14 NOTE — Telephone Encounter (Signed)
Per Dr. Randie Heinz, patient has been contacted and is agreeable to proceed.

## 2022-09-17 ENCOUNTER — Encounter (HOSPITAL_COMMUNITY)
Admission: RE | Disposition: A | Discharge status: Home / Self Care | Payer: Self-pay | Source: Home / Self Care | Attending: Vascular Surgery

## 2022-09-17 ENCOUNTER — Encounter (HOSPITAL_COMMUNITY): Payer: Self-pay | Admitting: Vascular Surgery

## 2022-09-17 ENCOUNTER — Ambulatory Visit (HOSPITAL_COMMUNITY)
Admission: RE | Admit: 2022-09-17 | Discharge: 2022-09-17 | Disposition: A | Discharge status: Home / Self Care | Payer: Medicare Other | Attending: Vascular Surgery | Admitting: Vascular Surgery

## 2022-09-17 ENCOUNTER — Other Ambulatory Visit: Payer: Self-pay

## 2022-09-17 DIAGNOSIS — Z86711 Personal history of pulmonary embolism: Secondary | ICD-10-CM | POA: Diagnosis not present

## 2022-09-17 DIAGNOSIS — Z86718 Personal history of other venous thrombosis and embolism: Secondary | ICD-10-CM | POA: Diagnosis not present

## 2022-09-17 DIAGNOSIS — Z408 Encounter for other prophylactic surgery: Secondary | ICD-10-CM | POA: Insufficient documentation

## 2022-09-17 DIAGNOSIS — M545 Low back pain, unspecified: Secondary | ICD-10-CM | POA: Diagnosis not present

## 2022-09-17 HISTORY — PX: IVC FILTER INSERTION: CATH118245

## 2022-09-17 LAB — POCT I-STAT, CHEM 8
BUN: 11 mg/dL (ref 8–23)
Calcium, Ion: 1.34 mmol/L (ref 1.15–1.40)
Chloride: 103 mmol/L (ref 98–111)
Creatinine, Ser: 1.2 mg/dL (ref 0.61–1.24)
Glucose, Bld: 108 mg/dL — ABNORMAL HIGH (ref 70–99)
HCT: 35 % — ABNORMAL LOW (ref 39.0–52.0)
Hemoglobin: 11.9 g/dL — ABNORMAL LOW (ref 13.0–17.0)
Potassium: 3.7 mmol/L (ref 3.5–5.1)
Sodium: 143 mmol/L (ref 135–145)
TCO2: 28 mmol/L (ref 22–32)

## 2022-09-17 SURGERY — IVC FILTER INSERTION
Anesthesia: LOCAL

## 2022-09-17 MED ORDER — HEPARIN (PORCINE) IN NACL 1000-0.9 UT/500ML-% IV SOLN
INTRAVENOUS | Status: DC | PRN
  Administered 2022-09-17: 500 mL

## 2022-09-17 MED ORDER — SODIUM CHLORIDE 0.9 % IV SOLN: INTRAVENOUS | Status: DC

## 2022-09-17 MED ORDER — IODIXANOL 320 MG/ML IV SOLN
INTRAVENOUS | Status: DC | PRN
  Administered 2022-09-17: 5 mL

## 2022-09-17 MED ORDER — FENTANYL CITRATE (PF) 100 MCG/2ML IJ SOLN
INTRAMUSCULAR | Status: DC | PRN
  Administered 2022-09-17: 50 ug via INTRAVENOUS

## 2022-09-17 MED ORDER — FENTANYL CITRATE (PF) 100 MCG/2ML IJ SOLN
INTRAMUSCULAR | Status: AC
  Filled 2022-09-17: qty 2

## 2022-09-17 MED ORDER — MIDAZOLAM HCL 2 MG/2ML IJ SOLN
INTRAMUSCULAR | Status: DC | PRN
  Administered 2022-09-17: 1 mg via INTRAVENOUS

## 2022-09-17 MED ORDER — APIXABAN 5 MG PO TABS: 5.0000 mg | ORAL_TABLET | Freq: Two times a day (BID) | ORAL | 0 refills | Status: AC

## 2022-09-17 MED ORDER — MIDAZOLAM HCL 2 MG/2ML IJ SOLN
INTRAMUSCULAR | Status: AC
  Filled 2022-09-17: qty 2

## 2022-09-17 MED ORDER — LIDOCAINE HCL (PF) 1 % IJ SOLN
INTRAMUSCULAR | Status: AC
  Filled 2022-09-17: qty 30

## 2022-09-17 MED ORDER — LIDOCAINE HCL (PF) 1 % IJ SOLN
INTRAMUSCULAR | Status: DC | PRN
  Administered 2022-09-17: 10 mL

## 2022-09-17 SURGICAL SUPPLY — 11 items
FILTER VC CELECT-FEMORAL (Filter) IMPLANT
KIT MICROPUNCTURE NIT STIFF (SHEATH) IMPLANT
KIT PV (KITS) ×1 IMPLANT
PROTECTION STATION PRESSURIZED (MISCELLANEOUS) ×1
SHEATH PROBE COVER 6X72 (BAG) IMPLANT
STATION PROTECTION PRESSURIZED (MISCELLANEOUS) IMPLANT
SYR MEDRAD MARK 7 150ML (SYRINGE) ×1 IMPLANT
TRANSDUCER W/STOPCOCK (MISCELLANEOUS) ×1 IMPLANT
TRAY PV CATH (CUSTOM PROCEDURE TRAY) ×1 IMPLANT
WIRE STARTER BENTSON 035X150 (WIRE) IMPLANT
WIRE TORQFLEX AUST .018X40CM (WIRE) IMPLANT

## 2022-09-17 NOTE — Op Note (Signed)
    Patient name: Kyle Delacruz MRN: [id number] DOB: [birth year] Sex: male  09/17/2022 Pre-operative Diagnosis: History of DVT with PE and planned spine surgery Post-operative diagnosis:  Same Surgeon:  Luanna Salk. Randie Heinz, MD Procedure Performed: 1.  Placement of infrarenal IVC filter from right common femoral vein approach using ultrasound guidance and IVC venogram identification of renal veins 2.  Moderate sedation with fentanyl and Versed for 9 minutes  Indications: 67 year old male with a history of DVT and PE I had initially evaluated last year for consideration of filter placement and we elected to bridge with Lovenox for any needed spinal instrumentation.  He subsequently underwent spinal surgery in November of last year complicated by DVT with PE now maintained on Eliquis.  He needs a revision surgery in June of this year and we have now discussed proceeding with IVC filter placement  Findings: The IVC was less than 20 mm in diameter the left renal vein was easily identified and the filter was placed in the infrarenal position.  Plan will be to remove the filter 3 months after patient fully recovered from surgery.   Procedure:  The patient was identified in the holding area and taken to room 8.  The patient was then placed supine on the table and prepped and draped in the usual sterile fashion.  A time out was called.  Ultrasound was used to evaluate the right common femoral vein which was noted to be patent and compressible.  Moderate sedation with fentanyl and Versed was administered vital signs were monitored throughout the case.  Common femoral vein on the right was cannulated with a micropuncture needle followed by wire and a sheath and a Bentson wire was placed followed by the introducer sheath over the wire and the wire was removed.  Central venogram was performed.  The left renal vein which was easily identifiable on the venogram was then marked on the screen and the filter was brought to  just below this and deployed.  Still shot was performed filter was noted to be intact without any significant tilt.  Sheath was removed and pressure held till hemostasis obtained.  Patient tolerated procedure without any complication.  Contrast: 5cc    Lucifer Soja C. Randie Heinz, MD [facility] Office: [phone] Pager: [phone]

## 2022-09-17 NOTE — H&P (Signed)
HPI:   Kyle Delacruz is a 67 y.o. male with spine surgery in last March and developed right lower extremity DVT and PE for which she was on Eliquis and just recently stopped after 6 months of therapy.  He is now scheduled for lumbar spine surgery and is here for clearance from an anticoagulation standpoint.  States at the time of the original clot he had significant swelling in the right lower extremity and very short distance shortness of breath when trying to walk around the block.  This is all resolved.  He has had back surgery in the past now having issues with numbness down the right leg and is scheduled for a second surgery in 6 weeks from now at [facility] regional.       Past Medical History:  Diagnosis Date   Allergy     Arthritis      osteo   CTS (carpal tunnel syndrome)     DJD (degenerative joint disease)     DM2 (diabetes mellitus, type 2) (HCC)     DVT (deep venous thrombosis) (HCC)     GERD (gastroesophageal reflux disease)     Gout     History of adenomatous polyps of colon 12/29/2019   History of nuclear stress test      Myoview 11/17: EF 54, no ST changes, hypertensive blood pressure response, no ischemia, low risk   Hyperlipidemia     Hypothyroidism     Nicotine addiction     OSA (obstructive sleep apnea)     Prostatitis     Reactive airway disease     Sarcocystosis     Sinus arrhythmia     Sleep apnea      cpap- not wearing   Tinea corporis           Family History  Problem Relation Age of Onset   Stroke Mother     Diabetes Father     Heart disease Father          sp CABG   Diabetes Paternal Grandmother     Colon cancer Neg Hx     Stomach cancer Neg Hx     Esophageal cancer Neg Hx     Rectal cancer Neg Hx     Prostate cancer Neg Hx           Past Surgical History:  Procedure Laterality Date   BILATERAL KNEE ARTHROSCOPY        right- 1997; left 2001   COLONOSCOPY   multiple last 2013   KNEE SURGERY Right 2014    torn R medial meniscus-  arthroscopy done   PARATHYROIDECTOMY   2005   ROTATOR CUFF REPAIR   2004    right   TONSILLECTOMY       TOTAL HIP ARTHROPLASTY Left 2009   TOTAL HIP ARTHROPLASTY Right 2006   UPPER GASTROINTESTINAL ENDOSCOPY          Short Social History:  Social History         Tobacco Use   Smoking status: Former      Packs/day: 0.50      Types: Cigarettes   Smokeless tobacco: Never   Tobacco comments:      Quit March 2020  Substance Use Topics   Alcohol use: Yes      Alcohol/week: 21.0 standard drinks of alcohol      Types: 21 Cans of beer per week      Comment: 2 beers per day  Allergies  Allergen Reactions   Bee Venom        Other reaction(s): Unknown   Iohexol Other (See Comments)       Code: RASH, Desc: rash 2 years ago, needs 13 hr prep for future exams     Lisinopril        Other reaction(s): Unknown   Metoclopramide Other (See Comments)      Other reaction(s): shaking too bad "Sweat like crazy"     Penicillins Hives   Shellfish Allergy Swelling            Current Outpatient Medications  Medication Sig Dispense Refill   allopurinol (ZYLOPRIM) 300 MG tablet Take 300 mg by mouth daily.        CRESTOR 20 MG tablet Take 20 mg by mouth daily.        cyclobenzaprine (FLEXERIL) 10 MG tablet Take 10 mg by mouth 3 (three) times daily as needed for muscle spasms.       fluticasone (FLONASE) 50 MCG/ACT nasal spray INSTILL 2 SPRAYS IN EACH NOSTRIL DAILY       HYDROcodone-acetaminophen (NORCO/VICODIN) 5-325 MG tablet Take 1 tablet by mouth 2 (two) times daily as needed.       levothyroxine (SYNTHROID, LEVOTHROID) 50 MCG tablet Take 50 mcg by mouth daily.       omeprazole (PRILOSEC) 40 MG capsule Take 40 mg by mouth 2 (two) times daily.        No current facility-administered medications for this visit.      Review of Systems  Constitutional:  Constitutional negative. HENT: HENT negative.  Eyes: Eyes negative.  Respiratory: Positive for shortness of breath.  GI:  Gastrointestinal negative.  Musculoskeletal: Positive for back pain, gait problem and leg pain.  Skin: Skin negative.  Hematologic: Hematologic/lymphatic negative.  Psychiatric: Psychiatric negative.          Objective:    Vitals:   09/17/22 0638  BP: (!) 99/55  Pulse: 64  Resp: 20  Temp: (!) 97.4 F (36.3 C)  SpO2: 100%      Physical Exam HENT:     Head: Normocephalic.     Nose: Nose normal.  Eyes:     Pupils: Pupils are equal, round, and reactive to light.  Cardiovascular:     Rate and Rhythm: Normal rate.     Pulses: Normal pulses.  Pulmonary:     Effort: Pulmonary effort is normal.  Abdominal:     General: Abdomen is flat.     Palpations: Abdomen is soft.  Musculoskeletal:     Cervical back: Normal range of motion and neck supple.     Right lower leg: No edema.     Left lower leg: No edema.  Skin:    General: Skin is warm.     Capillary Refill: Capillary refill takes less than 2 seconds.  Neurological:     General: No focal deficit present.     Mental Status: He is alert.  Psychiatric:        Mood and Affect: Mood normal.        Behavior: Behavior normal.        Thought Content: Thought content normal.        Judgment: Judgment normal.        Data: RIGHT    CompressibilityPhasicitySpontaneityProperties     Thrombus  Aging           +---------+---------------+---------+-----------+---------------+----------  ---+  CFV      Full           Yes      Yes                                        +---------+---------------+---------+-----------+---------------+----------  ---+  SFJ      Full                    Yes                                        +---------+---------------+---------+-----------+---------------+----------  ---+  FV Prox  Full           Yes      Yes                                         +---------+---------------+---------+-----------+---------------+----------  ---+  FV Mid   Full           Yes      Yes                                        +---------+---------------+---------+-----------+---------------+----------  ---+  FV DistalFull           Yes      Yes                                        +---------+---------------+---------+-----------+---------------+----------  ---+  POP      Partial        Yes      Yes        rigid                                                                       w/compression                   +---------+---------------+---------+-----------+---------------+----------  ---+  PTV      Full                    Yes                                        +---------+---------------+---------+-----------+---------------+----------  ---+  PERO     Full                    Yes                                        +---------+---------------+---------+-----------+---------------+----------  ---+  Gastroc  Full                                                               +---------+---------------+---------+-----------+---------------+----------  ---+  GSV      Full           Yes      Yes                                        +---------+---------------+---------+-----------+---------------+----------  ---+      +----+---------------+---------+-----------+----------+--------------+  LEFTCompressibilityPhasicitySpontaneityPropertiesThrombus Aging  +----+---------------+---------+-----------+----------+--------------+  CFV Full           Yes      Yes                                  +----+---------------+---------+-----------+----------+--------------+  SFJ Full                    Yes                                  +----+---------------+---------+-----------+----------+--------------+          Findings reported to Patient returning tomorrow with MD  visit.     Summary:  RIGHT:  - Continued thrombus in the popliteal vein with reconstitution. All other  veins appear patent.     LEFT:  - No evidence of common femoral vein obstruction.       Assessment/Plan:    67 year old male with a history of extensive DVT after spine surgery complicated by PE maintained on Eliquis.  He is now scheduled for a second spine surgery.  Given his high risk from DVT and PE plan will be for placement of IVC filter today and I did discuss this with him via telephone last Friday.  He should resume Eliquis tomorrow and we will hold at the discretion of Dr. Precious Gilding prior to his upcoming spine surgery.  We will plan to remove the filter when he has fully recovered from upcoming surgery likely in 3 months and I discussed this with him today.  He is accompanied by his wife.       Maeola Harman MD [facility]

## 2022-09-21 ENCOUNTER — Telehealth: Payer: Self-pay

## 2022-09-21 NOTE — Telephone Encounter (Signed)
Heather, RN with [facility] called requesting verbal orders for start of care on 5/13.  Reviewed pt's chart, returned call for clarification, two identifiers used. Verbal ok given. Confirmed understanding.

## 2022-09-27 DIAGNOSIS — M79671 Pain in right foot: Secondary | ICD-10-CM | POA: Diagnosis not present

## 2022-09-27 DIAGNOSIS — M79672 Pain in left foot: Secondary | ICD-10-CM | POA: Diagnosis not present

## 2022-10-13 HISTORY — PX: BACK SURGERY: SHX140

## 2022-10-26 DIAGNOSIS — T84296A Other mechanical complication of internal fixation device of vertebrae, initial encounter: Secondary | ICD-10-CM | POA: Diagnosis not present

## 2022-10-26 DIAGNOSIS — M5431 Sciatica, right side: Secondary | ICD-10-CM | POA: Diagnosis not present

## 2022-10-26 DIAGNOSIS — M48062 Spinal stenosis, lumbar region with neurogenic claudication: Secondary | ICD-10-CM | POA: Diagnosis not present

## 2022-10-30 DIAGNOSIS — R001 Bradycardia, unspecified: Secondary | ICD-10-CM | POA: Diagnosis not present

## 2022-11-05 DIAGNOSIS — M960 Pseudarthrosis after fusion or arthrodesis: Secondary | ICD-10-CM | POA: Diagnosis not present

## 2022-11-05 DIAGNOSIS — Z79899 Other long term (current) drug therapy: Secondary | ICD-10-CM | POA: Diagnosis not present

## 2022-11-05 DIAGNOSIS — M961 Postlaminectomy syndrome, not elsewhere classified: Secondary | ICD-10-CM | POA: Diagnosis not present

## 2022-11-05 DIAGNOSIS — M5431 Sciatica, right side: Secondary | ICD-10-CM | POA: Diagnosis not present

## 2022-11-05 DIAGNOSIS — Z87891 Personal history of nicotine dependence: Secondary | ICD-10-CM | POA: Diagnosis not present

## 2022-11-05 DIAGNOSIS — Z91013 Allergy to seafood: Secondary | ICD-10-CM | POA: Diagnosis not present

## 2022-11-05 DIAGNOSIS — Z86711 Personal history of pulmonary embolism: Secondary | ICD-10-CM | POA: Diagnosis not present

## 2022-11-05 DIAGNOSIS — E039 Hypothyroidism, unspecified: Secondary | ICD-10-CM | POA: Diagnosis not present

## 2022-11-05 DIAGNOSIS — E1136 Type 2 diabetes mellitus with diabetic cataract: Secondary | ICD-10-CM | POA: Diagnosis not present

## 2022-11-05 DIAGNOSIS — Z79890 Hormone replacement therapy (postmenopausal): Secondary | ICD-10-CM | POA: Diagnosis not present

## 2022-11-05 DIAGNOSIS — M48062 Spinal stenosis, lumbar region with neurogenic claudication: Secondary | ICD-10-CM | POA: Diagnosis not present

## 2022-11-05 DIAGNOSIS — Z981 Arthrodesis status: Secondary | ICD-10-CM | POA: Diagnosis not present

## 2022-11-05 DIAGNOSIS — I10 Essential (primary) hypertension: Secondary | ICD-10-CM | POA: Diagnosis not present

## 2022-11-05 DIAGNOSIS — Z880 Allergy status to penicillin: Secondary | ICD-10-CM | POA: Diagnosis not present

## 2022-11-05 DIAGNOSIS — Z8701 Personal history of pneumonia (recurrent): Secondary | ICD-10-CM | POA: Diagnosis not present

## 2022-11-05 DIAGNOSIS — Z888 Allergy status to other drugs, medicaments and biological substances status: Secondary | ICD-10-CM | POA: Diagnosis not present

## 2022-11-05 DIAGNOSIS — J449 Chronic obstructive pulmonary disease, unspecified: Secondary | ICD-10-CM | POA: Diagnosis not present

## 2022-11-05 DIAGNOSIS — T84296A Other mechanical complication of internal fixation device of vertebrae, initial encounter: Secondary | ICD-10-CM | POA: Diagnosis not present

## 2022-11-05 DIAGNOSIS — Z91030 Bee allergy status: Secondary | ICD-10-CM | POA: Diagnosis not present

## 2022-11-05 DIAGNOSIS — K219 Gastro-esophageal reflux disease without esophagitis: Secondary | ICD-10-CM | POA: Diagnosis not present

## 2022-11-05 DIAGNOSIS — Z7901 Long term (current) use of anticoagulants: Secondary | ICD-10-CM | POA: Diagnosis not present

## 2022-11-05 DIAGNOSIS — G4733 Obstructive sleep apnea (adult) (pediatric): Secondary | ICD-10-CM | POA: Diagnosis not present

## 2022-11-06 DIAGNOSIS — Z96643 Presence of artificial hip joint, bilateral: Secondary | ICD-10-CM | POA: Diagnosis not present

## 2022-11-06 DIAGNOSIS — Z981 Arthrodesis status: Secondary | ICD-10-CM | POA: Diagnosis not present

## 2022-11-06 DIAGNOSIS — I10 Essential (primary) hypertension: Secondary | ICD-10-CM | POA: Diagnosis not present

## 2022-11-06 DIAGNOSIS — E1136 Type 2 diabetes mellitus with diabetic cataract: Secondary | ICD-10-CM | POA: Diagnosis not present

## 2022-11-06 DIAGNOSIS — M48062 Spinal stenosis, lumbar region with neurogenic claudication: Secondary | ICD-10-CM | POA: Diagnosis not present

## 2022-11-06 DIAGNOSIS — T84296A Other mechanical complication of internal fixation device of vertebrae, initial encounter: Secondary | ICD-10-CM | POA: Diagnosis not present

## 2022-11-06 DIAGNOSIS — M960 Pseudarthrosis after fusion or arthrodesis: Secondary | ICD-10-CM | POA: Diagnosis not present

## 2022-11-06 DIAGNOSIS — M5431 Sciatica, right side: Secondary | ICD-10-CM | POA: Diagnosis not present

## 2022-11-23 DIAGNOSIS — R309 Painful micturition, unspecified: Secondary | ICD-10-CM | POA: Diagnosis not present

## 2022-11-27 ENCOUNTER — Other Ambulatory Visit: Payer: Self-pay

## 2022-11-27 DIAGNOSIS — Z95828 Presence of other vascular implants and grafts: Secondary | ICD-10-CM

## 2022-11-27 DIAGNOSIS — C61 Malignant neoplasm of prostate: Secondary | ICD-10-CM | POA: Diagnosis not present

## 2022-11-27 DIAGNOSIS — N5201 Erectile dysfunction due to arterial insufficiency: Secondary | ICD-10-CM | POA: Diagnosis not present

## 2022-11-27 DIAGNOSIS — D4102 Neoplasm of uncertain behavior of left kidney: Secondary | ICD-10-CM | POA: Diagnosis not present

## 2022-12-01 ENCOUNTER — Emergency Department (HOSPITAL_BASED_OUTPATIENT_CLINIC_OR_DEPARTMENT_OTHER): Payer: Medicare Other

## 2022-12-01 ENCOUNTER — Other Ambulatory Visit: Payer: Self-pay

## 2022-12-01 ENCOUNTER — Emergency Department (HOSPITAL_BASED_OUTPATIENT_CLINIC_OR_DEPARTMENT_OTHER)
Admission: EM | Admit: 2022-12-01 | Discharge: 2022-12-01 | Disposition: A | Discharge status: Home / Self Care | Payer: Medicare Other | Attending: Emergency Medicine | Admitting: Emergency Medicine

## 2022-12-01 ENCOUNTER — Encounter (HOSPITAL_BASED_OUTPATIENT_CLINIC_OR_DEPARTMENT_OTHER): Payer: Self-pay

## 2022-12-01 DIAGNOSIS — K6289 Other specified diseases of anus and rectum: Secondary | ICD-10-CM

## 2022-12-01 DIAGNOSIS — J45909 Unspecified asthma, uncomplicated: Secondary | ICD-10-CM | POA: Diagnosis not present

## 2022-12-01 DIAGNOSIS — K649 Unspecified hemorrhoids: Secondary | ICD-10-CM | POA: Diagnosis not present

## 2022-12-01 DIAGNOSIS — Z7951 Long term (current) use of inhaled steroids: Secondary | ICD-10-CM | POA: Diagnosis not present

## 2022-12-01 DIAGNOSIS — E039 Hypothyroidism, unspecified: Secondary | ICD-10-CM | POA: Insufficient documentation

## 2022-12-01 DIAGNOSIS — N400 Enlarged prostate without lower urinary tract symptoms: Secondary | ICD-10-CM | POA: Diagnosis not present

## 2022-12-01 DIAGNOSIS — M48061 Spinal stenosis, lumbar region without neurogenic claudication: Secondary | ICD-10-CM | POA: Diagnosis not present

## 2022-12-01 DIAGNOSIS — E119 Type 2 diabetes mellitus without complications: Secondary | ICD-10-CM | POA: Insufficient documentation

## 2022-12-01 DIAGNOSIS — Z7901 Long term (current) use of anticoagulants: Secondary | ICD-10-CM | POA: Insufficient documentation

## 2022-12-01 DIAGNOSIS — N200 Calculus of kidney: Secondary | ICD-10-CM | POA: Diagnosis not present

## 2022-12-01 DIAGNOSIS — Z79899 Other long term (current) drug therapy: Secondary | ICD-10-CM | POA: Diagnosis not present

## 2022-12-01 DIAGNOSIS — Z96643 Presence of artificial hip joint, bilateral: Secondary | ICD-10-CM | POA: Insufficient documentation

## 2022-12-01 DIAGNOSIS — M5410 Radiculopathy, site unspecified: Secondary | ICD-10-CM

## 2022-12-01 LAB — URINALYSIS, W/ REFLEX TO CULTURE (INFECTION SUSPECTED)
Bilirubin Urine: NEGATIVE
Glucose, UA: NEGATIVE mg/dL
Ketones, ur: NEGATIVE mg/dL
Leukocytes,Ua: NEGATIVE
Nitrite: NEGATIVE
Protein, ur: NEGATIVE mg/dL
Specific Gravity, Urine: 1.015 (ref 1.005–1.030)
pH: 6 (ref 5.0–8.0)

## 2022-12-01 LAB — COMPREHENSIVE METABOLIC PANEL
ALT: 19 U/L (ref 0–44)
AST: 19 U/L (ref 15–41)
Albumin: 4 g/dL (ref 3.5–5.0)
Alkaline Phosphatase: 73 U/L (ref 38–126)
Anion gap: 9 (ref 5–15)
BUN: 14 mg/dL (ref 8–23)
CO2: 25 mmol/L (ref 22–32)
Calcium: 9.6 mg/dL (ref 8.9–10.3)
Chloride: 102 mmol/L (ref 98–111)
Creatinine, Ser: 1.42 mg/dL — ABNORMAL HIGH (ref 0.61–1.24)
GFR, Estimated: 55 mL/min — ABNORMAL LOW (ref >60)
Glucose, Bld: 109 mg/dL — ABNORMAL HIGH (ref 70–99)
Potassium: 4.6 mmol/L (ref 3.5–5.1)
Sodium: 136 mmol/L (ref 135–145)
Total Bilirubin: 1 mg/dL (ref 0.3–1.2)
Total Protein: 7.5 g/dL (ref 6.5–8.1)

## 2022-12-01 LAB — CBC WITH DIFFERENTIAL/PLATELET
Abs Immature Granulocytes: 0.01 10*3/uL (ref 0.00–0.07)
Basophils Absolute: 0 10*3/uL (ref 0.0–0.1)
Basophils Relative: 0 %
Eosinophils Absolute: 0.4 10*3/uL (ref 0.0–0.5)
Eosinophils Relative: 5 %
HCT: 36.2 % — ABNORMAL LOW (ref 39.0–52.0)
Hemoglobin: 11.8 g/dL — ABNORMAL LOW (ref 13.0–17.0)
Immature Granulocytes: 0 %
Lymphocytes Relative: 31 %
Lymphs Abs: 2.3 10*3/uL (ref 0.7–4.0)
MCH: 29.4 pg (ref 26.0–34.0)
MCHC: 32.6 g/dL (ref 30.0–36.0)
MCV: 90 fL (ref 80.0–100.0)
Monocytes Absolute: 0.5 10*3/uL (ref 0.1–1.0)
Monocytes Relative: 7 %
Neutro Abs: 4.2 10*3/uL (ref 1.7–7.7)
Neutrophils Relative %: 57 %
Platelets: 166 10*3/uL (ref 150–400)
RBC: 4.02 MIL/uL — ABNORMAL LOW (ref 4.22–5.81)
RDW: 14.3 % (ref 11.5–15.5)
WBC: 7.5 10*3/uL (ref 4.0–10.5)
nRBC: 0 % (ref 0.0–0.2)

## 2022-12-01 LAB — LIPASE, BLOOD: Lipase: 45 U/L (ref 11–51)

## 2022-12-01 MED ORDER — HYDROMORPHONE HCL 1 MG/ML IJ SOLN
1.0000 mg | Freq: Once | INTRAMUSCULAR | Status: AC
  Administered 2022-12-01: 1 mg via INTRAVENOUS

## 2022-12-01 MED ORDER — DIAZEPAM 5 MG PO TABS: 5.0000 mg | ORAL_TABLET | Freq: Four times a day (QID) | ORAL | 0 refills | Status: DC | PRN

## 2022-12-01 MED ORDER — IOHEXOL 300 MG/ML  SOLN
80.0000 mL | Freq: Once | INTRAMUSCULAR | Status: AC | PRN
  Administered 2022-12-01: 80 mL via INTRAVENOUS

## 2022-12-01 MED ORDER — DEXAMETHASONE SODIUM PHOSPHATE 10 MG/ML IJ SOLN
10.0000 mg | Freq: Once | INTRAMUSCULAR | Status: AC
  Administered 2022-12-01: 10 mg via INTRAVENOUS
  Filled 2022-12-01: qty 1

## 2022-12-01 MED ORDER — HYDROMORPHONE HCL 1 MG/ML IJ SOLN
INTRAMUSCULAR | Status: AC
  Administered 2022-12-01: 1 mg via INTRAVENOUS
  Filled 2022-12-01: qty 1

## 2022-12-01 NOTE — ED Notes (Signed)
Received report from [place], Charity fundraiser. Patient resting quietly in stretcher, respirations even, unlabored, no acute distress noted. Denies needs at this time.  Awaiting results of imaging.

## 2022-12-01 NOTE — ED Triage Notes (Signed)
Patient had back surgery  6/24. He stated having sharp pains in his groin and rectum. He has seen his PCP and urology for same. He has a follow up with the surgeon this week. He stated he can't wait for that appointment due to pain.

## 2022-12-01 NOTE — ED Notes (Signed)
Patient ambulatory to restroom with walker.  

## 2022-12-01 NOTE — ED Provider Notes (Signed)
EMERGENCY DEPARTMENT AT [facility] Provider Note   CSN: [id number] Arrival date & time: 12/01/22  1605     History {Add pertinent medical, surgical, social history, OB history to HPI:1} Chief Complaint  Patient presents with   Groin Pain    Kyle Delacruz is a 67 y.o. male.  HPI     67 year old male with history of type 2 diabetes, hyperlipidemia, OSA, spine surgery a year ago in March after which she developed right lower extremity DVT and PE and was on Eliquis, colectomy/end ileostomy 03/2022, had IVC filter placed prior to spine surgery 6/24 (revision lumbar decompression and fusion) who presents with concern for pain in groin and rectum.  Reports the symptoms began 1 week after his back surgery.  Initially had some burning with urination following his back surgery as well as rectal pain, and was started on Cipro initially by his primary care doctor.  He was then placed on Bactrim which she is still taking now.  He reports that he saw his urologist who performed a prostate exam and felt that his symptoms were more likely secondary to his back surgery then a prostatitis, however recommend he continue the antibiotics that he is on.  He reports he is no longer having dysuria, but does feel that he is having incomplete emptying of his bladder at times.  He has an ostomy in place, no change in ostomy output.  Denies fevers.  Denies numbness, new weakness.  Reports has had chronic right foot drop which is unchanged.  The pain is becoming more severe and has not improved with him taking methocarbamol or oxycodone.  He is scheduled to see his spine surgeon early this week, but did not think he could make it due to severe pain.  Denies nausea, vomiting, chest pain, shortness of breath, leg swelling.  The pain does radiate at times from his groin down the front of his legs.  Reports that the rectal pain is constant.  Past Medical History:  Diagnosis Date   Allergy     Arthritis    osteo   CTS (carpal tunnel syndrome)    DJD (degenerative joint disease)    DM2 (diabetes mellitus, type 2) (HCC)    DVT (deep venous thrombosis) (HCC)    GERD (gastroesophageal reflux disease)    Gout    History of adenomatous polyps of colon 12/29/2019   History of nuclear stress test    Myoview 11/17: EF 54, no ST changes, hypertensive blood pressure response, no ischemia, low risk   Hyperlipidemia    Hypothyroidism    Nicotine addiction    OSA (obstructive sleep apnea)    Prostatitis    Reactive airway disease    Sarcocystosis    Sinus arrhythmia    Sleep apnea    cpap- not wearing   Tinea corporis      Past Surgical History:  Procedure Laterality Date   BILATERAL KNEE ARTHROSCOPY     right- 1997; left 2001   COLONOSCOPY  multiple last 2013   IVC FILTER INSERTION N/A 09/17/2022   Procedure: IVC FILTER INSERTION;  Surgeon: Maeola Harman, MD;  Location: [facility] INVASIVE CV LAB;  Service: Cardiovascular;  Laterality: N/A;   KNEE SURGERY Right 2014   torn R medial meniscus- arthroscopy done   LAPAROTOMY N/A 04/10/2022   Procedure: EXPLORATORY LAPAROTOMY, TOTAL COLECTOMY, END ILEOSTOMY;  Surgeon: Quentin Ore, MD;  Location: [facility];  Service: General;  Laterality: N/A;   PARATHYROIDECTOMY  2005   ROTATOR CUFF REPAIR  2004   right   TONSILLECTOMY     TOTAL HIP ARTHROPLASTY Left 2009   TOTAL HIP ARTHROPLASTY Right 2006   UPPER GASTROINTESTINAL ENDOSCOPY      Home Medications Prior to Admission medications   Medication Sig Start Date End Date Taking? Authorizing Provider  albuterol (VENTOLIN HFA) 108 (90 Base) MCG/ACT inhaler Inhale 2 puffs into the lungs every 6 (six) hours as needed for wheezing or shortness of breath. 04/22/22   Dorcas Carrow, MD  allopurinol (ZYLOPRIM) 100 MG tablet Take 1 tablet (100 mg total) by mouth at bedtime. 06/30/22   Almon Hercules, MD  apixaban (ELIQUIS) 5 MG TABS tablet Take 1 tablet (5 mg total) by mouth 2 (two)  times daily. 09/17/22 10/17/22  Maeola Harman, MD  Ascorbic Acid (VITAMIN C PO) Take 1 tablet by mouth daily. Vitafusion    [provider]  ferrous sulfate 325 (65 FE) MG tablet Take 325 mg by mouth daily. 07/09/22   [provider]  fluticasone (FLONASE) 50 MCG/ACT nasal spray Place 1 spray into both nostrils daily as needed for allergies. 12/13/21   [provider]  levothyroxine (SYNTHROID, LEVOTHROID) 50 MCG tablet Take 50 mcg by mouth daily. 02/18/15   [provider]  loperamide (IMODIUM) 2 MG capsule Take 2 capsules (4 mg total) by mouth every 8 (eight) hours as needed for diarrhea or loose stools. 04/22/22   Dorcas Carrow, MD  metoprolol tartrate (LOPRESSOR) 50 MG tablet Take 1 tablet (50 mg total) by mouth 2 (two) times daily. 06/20/22   Tereso Newcomer T, PA-C  Multiple Vitamin (MULTIVITAMIN WITH MINERALS) TABS tablet Take 1 tablet by mouth daily. Patient taking differently: Take 2 tablets by mouth daily. Alive/gummy 07/01/22   Almon Hercules, MD  omeprazole (PRILOSEC) 40 MG capsule Take 40 mg by mouth 2 (two) times daily. 01/31/21   [provider]  oxyCODONE-acetaminophen (PERCOCET) 10-325 MG tablet Take 1 tablet by mouth at bedtime as needed for pain.    [provider]  Probiotic Product (ALIGN) 4 MG CAPS Take 2 capsules by mouth daily. gummy    [provider]  rosuvastatin (CRESTOR) 40 MG tablet Take 40 mg by mouth daily. 06/30/11   [provider]  Vitamin D, Ergocalciferol, (DRISDOL) 1.25 MG (50000 UNIT) CAPS capsule Take 50,000 Units by mouth every 7 (seven) days.    [provider]      Allergies    Shellfish allergy, Bee venom, Lisinopril, Metoclopramide, and Penicillins    Review of Systems   Review of Systems  Physical Exam Updated Vital Signs BP 135/65 (BP Location: Left Arm)   Pulse (!) 58   Temp 98.3 F (36.8 C) (Oral)   Resp 16   Ht 5\' 10"  (1.778 m)   Wt 90 kg   SpO2 99%   BMI  28.47 kg/m  Physical Exam Vitals and nursing note reviewed.  Constitutional:      General: He is not in acute distress.    Appearance: He is well-developed. He is not diaphoretic.  HENT:     Head: Normocephalic and atraumatic.  Eyes:     Conjunctiva/sclera: Conjunctivae normal.  Cardiovascular:     Rate and Rhythm: Normal rate and regular rhythm.     Pulses: Normal pulses.  Pulmonary:     Effort: Pulmonary effort is normal. No respiratory distress.  Abdominal:     General: There is no distension.     Palpations:  Abdomen is soft.     Tenderness: There is abdominal tenderness (mild groin). There is no guarding.  Musculoskeletal:     Cervical back: Normal range of motion.  Skin:    General: Skin is warm and dry.  Neurological:     Mental Status: He is alert and oriented to person, place, and time.     ED Results / Procedures / Treatments   Labs (all labs ordered are listed, but only abnormal results are displayed) Labs Reviewed - No data to display  EKG None  Radiology No results found.  Procedures Procedures  {Document cardiac monitor, telemetry assessment procedure when appropriate:1}  Medications Ordered in ED Medications - No data to display  ED Course/ Medical Decision Making/ A&P   {   Click here for ABCD2, HEART and other calculatorsREFRESH Note before signing :1}                           66 year old male with history of type 2 diabetes, hyperlipidemia, OSA, spine surgery a year ago in March after which she developed right lower extremity DVT and PE and was on Eliquis, colectomy/end ileostomy 03/2022, had IVC filter placed prior to spine surgery 6/24 (revision lumbar decompression and fusion) now back on eliquis who presents with concern for pain in groin and rectum.  DDx includes prostatitis, UTI, pain radiating from lumbar surgery, intraabdominal, retroperitoneal or spinal abscess or hemorrhage, obstruction, nephrolithiasis. Normal pulses, no sign of  acute arterial thrombus, doubt aortic dissection.   Labs completed and personally evaluated and interpreted by me show ***  CT abdomen pelvis with lumbar focus performed showing ***  {Document critical care time when appropriate:1} {Document review of labs and clinical decision tools ie heart score, Chads2Vasc2 etc:1}  {Document your independent review of radiology images, and any outside records:1} {Document your discussion with family members, caretakers, and with consultants:1} {Document social determinants of health affecting pt's care:1} {Document your decision making why or why not admission, treatments were needed:1} Final Clinical Impression(s) / ED Diagnoses Final diagnoses:  None    Rx / DC Orders ED Discharge Orders     None

## 2022-12-01 NOTE — ED Notes (Signed)
Reviewed AVS with patient, patient expressed understanding of directions, denies further questions at this time. 

## 2022-12-04 DIAGNOSIS — M48062 Spinal stenosis, lumbar region with neurogenic claudication: Secondary | ICD-10-CM | POA: Diagnosis not present

## 2022-12-19 DIAGNOSIS — S92911A Unspecified fracture of right toe(s), initial encounter for closed fracture: Secondary | ICD-10-CM | POA: Diagnosis not present

## 2022-12-19 DIAGNOSIS — S99201A Unspecified physeal fracture of phalanx of right toe, initial encounter for closed fracture: Secondary | ICD-10-CM | POA: Diagnosis not present

## 2022-12-19 DIAGNOSIS — M7989 Other specified soft tissue disorders: Secondary | ICD-10-CM | POA: Diagnosis not present

## 2022-12-19 DIAGNOSIS — M79674 Pain in right toe(s): Secondary | ICD-10-CM | POA: Diagnosis not present

## 2022-12-19 DIAGNOSIS — E119 Type 2 diabetes mellitus without complications: Secondary | ICD-10-CM | POA: Diagnosis not present

## 2022-12-19 DIAGNOSIS — S9031XA Contusion of right foot, initial encounter: Secondary | ICD-10-CM | POA: Diagnosis not present

## 2022-12-27 ENCOUNTER — Other Ambulatory Visit: Payer: Self-pay | Admitting: Urology

## 2022-12-27 DIAGNOSIS — C61 Malignant neoplasm of prostate: Secondary | ICD-10-CM

## 2023-01-08 DIAGNOSIS — M1711 Unilateral primary osteoarthritis, right knee: Secondary | ICD-10-CM | POA: Diagnosis not present

## 2023-01-08 DIAGNOSIS — M7918 Myalgia, other site: Secondary | ICD-10-CM | POA: Diagnosis not present

## 2023-01-08 DIAGNOSIS — M25551 Pain in right hip: Secondary | ICD-10-CM | POA: Diagnosis not present

## 2023-01-24 DIAGNOSIS — Z932 Ileostomy status: Secondary | ICD-10-CM | POA: Diagnosis not present

## 2023-01-24 DIAGNOSIS — K631 Perforation of intestine (nontraumatic): Secondary | ICD-10-CM | POA: Diagnosis not present

## 2023-01-28 ENCOUNTER — Ambulatory Visit (HOSPITAL_COMMUNITY)
Admission: RE | Admit: 2023-01-28 | Discharge: 2023-01-28 | Disposition: A | Discharge status: Home / Self Care | Payer: Medicare PPO | Attending: Vascular Surgery | Admitting: Vascular Surgery

## 2023-01-28 ENCOUNTER — Other Ambulatory Visit: Payer: Self-pay

## 2023-01-28 ENCOUNTER — Encounter (HOSPITAL_COMMUNITY)
Admission: RE | Disposition: A | Discharge status: Home / Self Care | Payer: Self-pay | Source: Home / Self Care | Attending: Vascular Surgery

## 2023-01-28 DIAGNOSIS — Z86711 Personal history of pulmonary embolism: Secondary | ICD-10-CM | POA: Diagnosis not present

## 2023-01-28 DIAGNOSIS — Z95828 Presence of other vascular implants and grafts: Secondary | ICD-10-CM

## 2023-01-28 DIAGNOSIS — Z4689 Encounter for fitting and adjustment of other specified devices: Secondary | ICD-10-CM | POA: Insufficient documentation

## 2023-01-28 DIAGNOSIS — Z8249 Family history of ischemic heart disease and other diseases of the circulatory system: Secondary | ICD-10-CM | POA: Insufficient documentation

## 2023-01-28 DIAGNOSIS — Z7901 Long term (current) use of anticoagulants: Secondary | ICD-10-CM | POA: Diagnosis not present

## 2023-01-28 DIAGNOSIS — E119 Type 2 diabetes mellitus without complications: Secondary | ICD-10-CM | POA: Diagnosis not present

## 2023-01-28 DIAGNOSIS — Z86718 Personal history of other venous thrombosis and embolism: Secondary | ICD-10-CM | POA: Diagnosis not present

## 2023-01-28 DIAGNOSIS — Z87891 Personal history of nicotine dependence: Secondary | ICD-10-CM | POA: Diagnosis not present

## 2023-01-28 DIAGNOSIS — M1990 Unspecified osteoarthritis, unspecified site: Secondary | ICD-10-CM | POA: Insufficient documentation

## 2023-01-28 DIAGNOSIS — E785 Hyperlipidemia, unspecified: Secondary | ICD-10-CM | POA: Insufficient documentation

## 2023-01-28 DIAGNOSIS — G4733 Obstructive sleep apnea (adult) (pediatric): Secondary | ICD-10-CM | POA: Diagnosis not present

## 2023-01-28 HISTORY — PX: IVC FILTER REMOVAL: CATH118246

## 2023-01-28 LAB — POCT I-STAT, CHEM 8
BUN: 16 mg/dL (ref 8–23)
Calcium, Ion: 1.3 mmol/L (ref 1.15–1.40)
Chloride: 102 mmol/L (ref 98–111)
Creatinine, Ser: 1.2 mg/dL (ref 0.61–1.24)
Glucose, Bld: 110 mg/dL — ABNORMAL HIGH (ref 70–99)
HCT: 38 % — ABNORMAL LOW (ref 39.0–52.0)
Hemoglobin: 12.9 g/dL — ABNORMAL LOW (ref 13.0–17.0)
Potassium: 4 mmol/L (ref 3.5–5.1)
Sodium: 140 mmol/L (ref 135–145)
TCO2: 27 mmol/L (ref 22–32)

## 2023-01-28 LAB — GLUCOSE, CAPILLARY: Glucose-Capillary: 115 mg/dL — ABNORMAL HIGH (ref 70–99)

## 2023-01-28 SURGERY — IVC FILTER REMOVAL
Anesthesia: LOCAL

## 2023-01-28 MED ORDER — LIDOCAINE HCL (PF) 1 % IJ SOLN
INTRAMUSCULAR | Status: DC | PRN
  Administered 2023-01-28: 15 mL

## 2023-01-28 MED ORDER — LIDOCAINE HCL (PF) 1 % IJ SOLN
INTRAMUSCULAR | Status: AC
  Filled 2023-01-28: qty 30

## 2023-01-28 MED ORDER — SODIUM CHLORIDE 0.9 % IV SOLN: INTRAVENOUS | Status: DC

## 2023-01-28 MED ORDER — HEPARIN (PORCINE) IN NACL 1000-0.9 UT/500ML-% IV SOLN
INTRAVENOUS | Status: DC | PRN
  Administered 2023-01-28: 500 mL

## 2023-01-28 MED ORDER — IODIXANOL 320 MG/ML IV SOLN
INTRAVENOUS | Status: DC | PRN
  Administered 2023-01-28: 10 mL

## 2023-01-28 SURGICAL SUPPLY — 7 items
KIT MICROPUNCTURE NIT STIFF (SHEATH) IMPLANT
KIT PV (KITS) ×1 IMPLANT
KIT SYRINGE INJ CVI SPIKEX1 (MISCELLANEOUS) IMPLANT
SET CLOVERSNARE FLT RETRIEVAL (MISCELLANEOUS) IMPLANT
TRANSDUCER W/STOPCOCK (MISCELLANEOUS) ×1 IMPLANT
TRAY PV CATH (CUSTOM PROCEDURE TRAY) ×1 IMPLANT
WIRE BENTSON .035X145CM (WIRE) IMPLANT

## 2023-01-28 NOTE — H&P (Signed)
HPI:   Kyle Delacruz is a 67 y.o. male with spine surgery in last March and developed right lower extremity DVT and PE for which he was on Eliquis and just recently stopped after 6 months of therapy.  He is now scheduled for lumbar spine surgery and is here for clearance from an anticoagulation standpoint.  States at the time of the original clot he had significant swelling in the right lower extremity and very short distance shortness of breath when trying to walk around the block.  This is all resolved.  He has had back surgery in the past now having issues with numbness down the right leg and is scheduled for a second surgery in 6 weeks from now at [facility] regional.  He has now recovered well from most recent surgery with no further surgical plans and is indicated for filter retrieval.          Past Medical History:  Diagnosis Date   Allergy     Arthritis      osteo   CTS (carpal tunnel syndrome)     DJD (degenerative joint disease)     DM2 (diabetes mellitus, type 2) (HCC)     DVT (deep venous thrombosis) (HCC)     GERD (gastroesophageal reflux disease)     Gout     History of adenomatous polyps of colon 12/29/2019   History of nuclear stress test      Myoview 11/17: EF 54, no ST changes, hypertensive blood pressure response, no ischemia, low risk   Hyperlipidemia     Hypothyroidism     Nicotine addiction     OSA (obstructive sleep apnea)     Prostatitis     Reactive airway disease     Sarcocystosis     Sinus arrhythmia     Sleep apnea      cpap- not wearing   Tinea corporis               Family History  Problem Relation Age of Onset   Stroke Mother     Diabetes Father     Heart disease Father          sp CABG   Diabetes Paternal Grandmother     Colon cancer Neg Hx     Stomach cancer Neg Hx     Esophageal cancer Neg Hx     Rectal cancer Neg Hx     Prostate cancer Neg Hx               Past Surgical History:  Procedure Laterality Date   BILATERAL KNEE  ARTHROSCOPY        right- 1997; left 2001   COLONOSCOPY   multiple last 2013   KNEE SURGERY Right 2014    torn R medial meniscus- arthroscopy done   PARATHYROIDECTOMY   2005   ROTATOR CUFF REPAIR   2004    right   TONSILLECTOMY       TOTAL HIP ARTHROPLASTY Left 2009   TOTAL HIP ARTHROPLASTY Right 2006   UPPER GASTROINTESTINAL ENDOSCOPY          Short Social History:  Social History             Tobacco Use   Smoking status: Former      Packs/day: 0.50      Types: Cigarettes   Smokeless tobacco: Never   Tobacco comments:      Quit March 2020  Substance Use Topics   Alcohol  use: Yes      Alcohol/week: 21.0 standard drinks of alcohol      Types: 21 Cans of beer per week      Comment: 2 beers per day               Allergies  Allergen Reactions   Bee Venom        Other reaction(s): Unknown   Iohexol Other (See Comments)       Code: RASH, Desc: rash 2 years ago, needs 13 hr prep for future exams     Lisinopril        Other reaction(s): Unknown   Metoclopramide Other (See Comments)      Other reaction(s): shaking too bad "Sweat like crazy"     Penicillins Hives   Shellfish Allergy Swelling                 Current Outpatient Medications  Medication Sig Dispense Refill   allopurinol (ZYLOPRIM) 300 MG tablet Take 300 mg by mouth daily.        CRESTOR 20 MG tablet Take 20 mg by mouth daily.        cyclobenzaprine (FLEXERIL) 10 MG tablet Take 10 mg by mouth 3 (three) times daily as needed for muscle spasms.       fluticasone (FLONASE) 50 MCG/ACT nasal spray INSTILL 2 SPRAYS IN EACH NOSTRIL DAILY       HYDROcodone-acetaminophen (NORCO/VICODIN) 5-325 MG tablet Take 1 tablet by mouth 2 (two) times daily as needed.       levothyroxine (SYNTHROID, LEVOTHROID) 50 MCG tablet Take 50 mcg by mouth daily.       omeprazole (PRILOSEC) 40 MG capsule Take 40 mg by mouth 2 (two) times daily.        No current facility-administered medications for this visit.      Review of Systems   Constitutional:  Constitutional negative. HENT: HENT negative.  Eyes: Eyes negative.  Respiratory: Positive for shortness of breath.  GI: Gastrointestinal negative.  Musculoskeletal: Positive for back pain, gait problem and leg pain.  Skin: Skin negative.  Hematologic: Hematologic/lymphatic negative.  Psychiatric: Psychiatric negative.          Objective:   Vitals:   01/28/23 0629  BP: (!) 101/57  Pulse: (!) 45  Resp: 17  Temp: (!) 97.4 F (36.3 C)  SpO2: 98%      Physical Exam HENT:     Head: Normocephalic.     Nose: Nose normal.  Eyes:     Pupils: Pupils are equal, round, and reactive to light.  Cardiovascular:     Rate and Rhythm: Normal rate.     Pulses: Normal pulses.  Pulmonary:     Effort: Pulmonary effort is normal.  Abdominal:     General: Abdomen is flat.     Palpations: Abdomen is soft.  Musculoskeletal:     Cervical back: Normal range of motion and neck supple.     Right lower leg: No edema.     Left lower leg: No edema.  Skin:    General: Skin is warm.     Capillary Refill: Capillary refill takes less than 2 seconds.  Neurological:     General: No focal deficit present.     Mental Status: He is alert.  Psychiatric:        Mood and Affect: Mood normal.        Behavior: Behavior normal.        Thought Content: Thought content normal.  Judgment: Judgment normal.          Assessment/Plan:    67 year old male with a history of extensive DVT after spine surgery complicated by PE maintained on Eliquis.  He has undergone revision spine surgery and fully recovered. Plan to remove ivc filter today. We have again discussed risks, benefits and alternatives.        Maeola Harman MD [facility]

## 2023-01-28 NOTE — Op Note (Signed)
Patient name: Kyle Delacruz MRN: [id number] DOB: 11-Feb-1956 Sex: male  01/28/2023 Pre-operative Diagnosis: History of DVT and PE Post-operative diagnosis:  Same Surgeon:  Luanna Salk. Randie Heinz, MD Procedure Performed: 1.  Ultrasound-guided cannulation right internal jugular vein 2.  IVC venogram 3.  IVC filter removal  Indications: 67 year old male with a history of DVT with PE on 2 separate occasions most recently after lumbar spine surgery.  He has now completed his surgical interventions of the spine and is indicated for filter removal.  Findings: Filter was midline in the IVC and was removed and completion venogram demonstrated no active extravasation.   Procedure:  The patient was identified in the holding area and taken to room 8.  The patient was then placed supine on the table and prepped and draped in the usual sterile fashion.  A time out was called.  Ultrasound was used to evaluate the right internal jugular vein which was large, patent and compressible.  The area was anesthetized Lidocaine and cannulated with micropuncture needle followed by wire and sheath.  An ultrasound image was saved to the permanent record.  Bentson wire was placed in the IVC into the filter removal sheath was placed into the IVC under fluoroscopic guidance.  The filter was then snared on the hook and the sheath was advanced over the filter and the filter was removed and was noted to be totally intact.  Completion venogram was performed and the sheath was removed and pressure held till hemostasis obtained.  The patient tolerated procedure without any complication.  Contrast: 10 cc    Darean Rote C. Randie Heinz, MD [facility] Office: [phone] Pager: [phone]

## 2023-01-29 ENCOUNTER — Encounter (HOSPITAL_COMMUNITY): Payer: Self-pay | Admitting: Vascular Surgery

## 2023-01-30 ENCOUNTER — Other Ambulatory Visit (HOSPITAL_COMMUNITY): Payer: Self-pay

## 2023-02-19 ENCOUNTER — Encounter: Payer: Self-pay | Admitting: Urology

## 2023-02-20 ENCOUNTER — Ambulatory Visit
Admission: RE | Admit: 2023-02-20 | Discharge: 2023-02-20 | Disposition: A | Discharge status: Home / Self Care | Payer: Medicare PPO | Source: Ambulatory Visit | Attending: Urology | Admitting: Urology

## 2023-02-20 DIAGNOSIS — C61 Malignant neoplasm of prostate: Secondary | ICD-10-CM

## 2023-02-25 ENCOUNTER — Other Ambulatory Visit: Payer: Self-pay | Admitting: Urology

## 2023-02-25 DIAGNOSIS — C61 Malignant neoplasm of prostate: Secondary | ICD-10-CM

## 2023-02-25 DIAGNOSIS — R0981 Nasal congestion: Secondary | ICD-10-CM | POA: Diagnosis not present

## 2023-02-25 DIAGNOSIS — G4733 Obstructive sleep apnea (adult) (pediatric): Secondary | ICD-10-CM | POA: Diagnosis not present

## 2023-02-25 DIAGNOSIS — J309 Allergic rhinitis, unspecified: Secondary | ICD-10-CM | POA: Diagnosis not present

## 2023-02-25 DIAGNOSIS — J45998 Other asthma: Secondary | ICD-10-CM | POA: Diagnosis not present

## 2023-02-25 DIAGNOSIS — R058 Other specified cough: Secondary | ICD-10-CM | POA: Diagnosis not present

## 2023-03-14 ENCOUNTER — Ambulatory Visit (HOSPITAL_COMMUNITY)
Admission: RE | Admit: 2023-03-14 | Discharge: 2023-03-14 | Disposition: A | Discharge status: Home / Self Care | Payer: No Typology Code available for payment source | Source: Ambulatory Visit | Attending: Urology | Admitting: Urology

## 2023-03-14 DIAGNOSIS — N400 Enlarged prostate without lower urinary tract symptoms: Secondary | ICD-10-CM | POA: Diagnosis not present

## 2023-03-14 DIAGNOSIS — C61 Malignant neoplasm of prostate: Secondary | ICD-10-CM | POA: Diagnosis not present

## 2023-03-14 DIAGNOSIS — R102 Pelvic and perineal pain: Secondary | ICD-10-CM | POA: Diagnosis not present

## 2023-03-14 MED ORDER — GADOBUTROL 1 MMOL/ML IV SOLN
9.0000 mL | Freq: Once | INTRAVENOUS | Status: AC | PRN
  Administered 2023-03-14: 9 mL via INTRAVENOUS

## 2023-03-25 ENCOUNTER — Encounter: Payer: Self-pay | Admitting: Internal Medicine

## 2023-03-26 DIAGNOSIS — K631 Perforation of intestine (nontraumatic): Secondary | ICD-10-CM | POA: Diagnosis not present

## 2023-03-26 DIAGNOSIS — Z932 Ileostomy status: Secondary | ICD-10-CM | POA: Diagnosis not present

## 2023-03-31 ENCOUNTER — Other Ambulatory Visit: Payer: Medicare PPO

## 2023-04-30 DIAGNOSIS — K631 Perforation of intestine (nontraumatic): Secondary | ICD-10-CM | POA: Diagnosis not present

## 2023-04-30 DIAGNOSIS — T84296A Other mechanical complication of internal fixation device of vertebrae, initial encounter: Secondary | ICD-10-CM | POA: Diagnosis not present

## 2023-04-30 DIAGNOSIS — M4326 Fusion of spine, lumbar region: Secondary | ICD-10-CM | POA: Diagnosis not present

## 2023-04-30 DIAGNOSIS — M960 Pseudarthrosis after fusion or arthrodesis: Secondary | ICD-10-CM | POA: Diagnosis not present

## 2023-04-30 DIAGNOSIS — Z932 Ileostomy status: Secondary | ICD-10-CM | POA: Diagnosis not present

## 2023-04-30 DIAGNOSIS — M48062 Spinal stenosis, lumbar region with neurogenic claudication: Secondary | ICD-10-CM | POA: Diagnosis not present

## 2023-05-13 NOTE — Therapy (Signed)
 OUTPATIENT PHYSICAL THERAPY THORACOLUMBAR EVALUATION   Patient Name: Kyle Delacruz MRN: [id number] DOB:March 17, 1956, 67 y.o., male Today's Date: 05/23/2023  END OF SESSION:  PT End of Session - 05/23/23 1321     Visit Number 1    PT Start Time 1317    PT Stop Time 1402    PT Time Calculation (min) 45 min    Activity Tolerance Patient tolerated treatment well    Behavior During Therapy WFL for tasks assessed/performed             Past Medical History:  Diagnosis Date   Allergy    Arthritis    osteo   CTS (carpal tunnel syndrome)    DJD (degenerative joint disease)    DM2 (diabetes mellitus, type 2) (HCC)    DVT (deep venous thrombosis) (HCC)    GERD (gastroesophageal reflux disease)    Gout    History of adenomatous polyps of colon 12/29/2019   History of nuclear stress test    Myoview  11/17: EF 54, no ST changes, hypertensive blood pressure response, no ischemia, low risk   Hyperlipidemia    Hypothyroidism    Nicotine addiction    OSA (obstructive sleep apnea)    Prostatitis    Reactive airway disease    Sarcocystosis    Sinus arrhythmia    Sleep apnea    cpap- not wearing   Tinea corporis    Past Surgical History:  Procedure Laterality Date   BILATERAL KNEE ARTHROSCOPY     right- 1997; left 2001   COLONOSCOPY  multiple last 2013   IVC FILTER INSERTION N/A 09/17/2022   Procedure: IVC FILTER INSERTION;  Surgeon: Sheree Penne Bruckner, MD;  Location: [facility] INVASIVE CV LAB;  Service: Cardiovascular;  Laterality: N/A;   IVC FILTER REMOVAL N/A 01/28/2023   Procedure: IVC FILTER REMOVAL;  Surgeon: Sheree Penne Bruckner, MD;  Location: [facility] INVASIVE CV LAB;  Service: Cardiovascular;  Laterality: N/A;   KNEE SURGERY Right 2014   torn R medial meniscus- arthroscopy done   LAPAROTOMY N/A 04/10/2022   Procedure: EXPLORATORY LAPAROTOMY, TOTAL COLECTOMY, END ILEOSTOMY;  Surgeon: Lyndel Deward PARAS, MD;  Location: [facility] ORS;  Service: General;  Laterality: N/A;    PARATHYROIDECTOMY  2005   ROTATOR CUFF REPAIR  2004   right   TONSILLECTOMY     TOTAL HIP ARTHROPLASTY Left 2009   TOTAL HIP ARTHROPLASTY Right 2006   UPPER GASTROINTESTINAL ENDOSCOPY     Patient Active Problem List   Diagnosis Date Noted   History of pulmonary embolism 06/28/2022   History of DVT (deep vein thrombosis) 06/28/2022   Lumbar radiculopathy, chronic 06/28/2022   Nausea and vomiting 06/28/2022   Septic shock (HCC) 06/26/2022   SVT (supraventricular tachycardia) (HCC) 06/19/2022   PAF (paroxysmal atrial fibrillation) (HCC) 06/18/2022   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   High output ileostomy (HCC) 04/21/2022   Chronic anticoagulation 04/21/2022   Normocytic anemia 04/10/2022   Shock (HCC) 04/10/2022   Deep vein thrombosis (DVT) of femoral vein of right lower extremity (HCC) 04/09/2022   Leg DVT (deep venous thromboembolism), chronic, right (HCC) 04/08/2022   Abdominal distension 04/08/2022   Acute respiratory failure with hypoxia (HCC) 07/27/2021   PE (pulmonary thromboembolism) (HCC) 07/26/2021   Carcinoma of prostate (HCC) 07/10/2021   Chronic obstructive pulmonary disease (HCC) 07/10/2021   HLD (hyperlipidemia) 03/10/2021   Hypothyroidism 03/10/2021   Chronic bilateral back pain 03/10/2021   History of adenomatous polyps of colon 12/29/2019   Essential hypertension 06/13/2011   Obstructive  sleep apnea (adult) (pediatric) 06/13/2011   Gastro-esophageal reflux disease without esophagitis 05/02/2009    PCP: [name], [name], MD   REFERRING PROVIDER: Moffo, Katelyn Rose, PA*   REFERRING DIAG: [phone] (ICD-10-CM) - Fusion of spine of lumbar region   Rationale for Evaluation and Treatment: Rehabilitation  THERAPY DIAG:  Other low back pain  Muscle weakness (generalized)  Abnormal posture  Difficulty in walking, not elsewhere classified  ONSET DATE: Back surgery 04/04/22, PE 04/08/22, 06/26/22 Sepsis, 11/05/22 Second back surgery to remove hardware and remove IVC  filter  SUBJECTIVE:                                                                                                                                                                                           SUBJECTIVE STATEMENT: They said I need to strengthen my back muscles cause I can't stand up and walk like I want to, I know I can take a fall now, I was sitting around a fire circle and flipped back in a chair, got an x-ray and everything was good, didn't loosen the hardware, bone is healing though everything.  My neck has been a little sore from that too. Has had a lot of surgeries starting with original back surgery on 04/04/22, developed PE on 04/08/22, then developed sepsis on 06/26/22, had increased back pain starting in March 2024, cage in back came loose, had hardware removed and IVC on 11/05/22.  Can't lay flat because sleeping in recliner which also makes neck sore.  Would like to get back to swimming too.   PERTINENT HISTORY:  T2DM, osteoarthritis, history DVT, chronic anticoagulation, prostate cancer, HTN, HLD, COPD, OSA, GERD, hypothyroidism, chronic LBP, R THA 2006, L THA 2009, previous back surgery  PAIN:  Are you having pain? Yes: NPRS scale: 5/10 Pain location: low back  Pain description: ache Aggravating factors: standing and walking > 30 min  Relieving factors: sitting in recliner  PRECAUTIONS: Fall and Other: has ostomy  RED FLAGS: None   WEIGHT BEARING RESTRICTIONS: No  FALLS:  Has patient fallen in last 6 months? Yes. Number of falls 1 - fell backwards in chair and tripped on R foot, weakness in R ankle DF  LIVING ENVIRONMENT: Lives with: lives with their spouse Lives in: House/apartment Stairs: Yes: External: 6 steps; on left going up 2nd floor bonus room but does not go up those stairs, living area on 1st floor, 6 steps to garage Has following equipment at home: [employer], Grab bars, and scooter  OCCUPATION: retired from post office  PLOF: Independent and  Leisure: swimming  PATIENT GOALS: return to swimming, be able to walk  without pain, walk on treadmill, and bowl again  NEXT MD VISIT: in 5 months  OBJECTIVE:   DIAGNOSTIC FINDINGS:  11/06/22 XR Lumbar spine IMPRESSION:  1. L2-L5 posterior lumbar fusion with instrumentation and anterior  lumbar discectomy. Stable alignment.   PATIENT SURVEYS:  Modified Oswestry 22/50   COGNITION: Overall cognitive status: Within functional limits for tasks assessed     SENSATION: WFL  MUSCLE LENGTH: Hamstrings: significant tightness bilaterally  POSTURE: decreased lumbar lordosis, maintains 20 deg forward hip flexion  PALPATION: Diffuse tenderness low back   LUMBAR ROM:  lumbar AROM limited by fusion, no pain with flexion, unable to extend to neutral due to pain.   LOWER EXTREMITY ROM:   significant tightness bil hips R >L for both IR and ER.    LOWER EXTREMITY MMT:    MMT Right* eval Left* eval  Hip flexion 4 5  Hip extension    Hip abduction 4 5  Hip adduction 4+ 5  Knee flexion 5 5  Knee extension 5 5  Ankle dorsiflexion 3 5  Ankle plantarflexion 4 5   (Blank rows = not tested) * tested in sitting  LUMBAR SPECIAL TESTS:  NA   FUNCTIONAL TESTS:  5 times sit to stand: 15.6 sec    - 240'   Gait speed 0.6 m/s GAIT: Distance walked: 240' Assistive device utilized: None Level of assistance: Complete Independence Comments: wide BOS, forward flexed posture, visually slow  TODAY'S TREATMENT:                                                                                                                              DATE:   05/22/2022 EVAL  Self Care: Findings, POC, recommendations for aquatic therapy In reclined position on mat table - hip IR/ER windshield wipers x 10 - reported decreased LBP   PATIENT EDUCATION:  Education details: findings, POC, initial HEP Person educated: Patient Education method: Explanation, Demonstration, and Handouts Education  comprehension: verbalized understanding and returned demonstration  HOME EXERCISE PROGRAM: Access Code: [email or web address] URL: [email or web address] Date: 05/23/2023 Prepared by: Almarie Sprinkles  Exercises - Supine Hip Internal and External Rotation  - 1 x daily - 7 x weekly - 1 sets - 10 reps  ASSESSMENT:  CLINICAL IMPRESSION: Kyle Delacruz  is a 67 y.o. male who was seen today for physical therapy evaluation and treatment for s/p lumbar fusion surgery in June 2024 after multiple complications following original back surgery November 2023.  As a result he reports decreased activity tolerance, inability to stand straight or ambulate long distances, lay flat or sleep in bed, and now has ostomy.  His goal is to be able return to activities including swimming, treadmill walking and possibly bowling. Examination revealed patient is at risk for falls and functional decline as evidenced by the following objective test measures: Gait speed 0.6 m/sec, (33m/sec is needed for community access), 5x sit to stand of 15.6 sec (>15sec indicates increased risk for  falls and decreased BLE power), weakness in R ankle DF (increasing risk of tripping and falling), 2WMT 240 ft (mean for men 60-69 is 670ft).  Reports modified Oswestry score of 22/50 = 44% severe disability due to low back pain.  Also demonstrates significant hip flexor tightness secondary to sleeping in recliner for over year causing posture impairment and impacting gait.  Kyle Delacruz will benefit from skilled physical therapy services to help reach the maximal level of functional independence and mobility. Pt demonstrates understanding of this plan of care and is in agreement with this plan.     OBJECTIVE IMPAIRMENTS: Abnormal gait, decreased activity tolerance, decreased balance, decreased endurance, decreased mobility, difficulty walking, decreased ROM, decreased strength, impaired perceived functional ability, increased muscle spasms,  impaired flexibility, impaired sensation, postural dysfunction, and pain.   ACTIVITY LIMITATIONS: carrying, lifting, bending, standing, squatting, sleeping, stairs, transfers, bed mobility, and locomotion level  PARTICIPATION LIMITATIONS: meal prep, cleaning, laundry, driving, shopping, community activity, and occupation  PERSONAL FACTORS: Time since onset of injury/illness/exacerbation and 3+ comorbidities: T2DM, osteoarthritis, history DVT, chronic anticoagulation, prostate cancer, HTN, HLD, COPD, OSA, GERD, hypothyroidism, chronic LBP, R THA 2006, L THA 2009, previous back surgery  are also affecting patient's functional outcome.   REHAB POTENTIAL: Good  CLINICAL DECISION MAKING: Evolving/moderate complexity  EVALUATION COMPLEXITY: Moderate   GOALS: Goals reviewed with patient? Yes  SHORT TERM GOALS: Target date: 06/06/2023   Patient will be independent with initial HEP.  Baseline:  Goal status: INITIAL   LONG TERM GOALS: Target date: 07/18/2023   Patient will be independent with advanced/ongoing HEP to improve outcomes and carryover.  Baseline:  Goal status: INITIAL  2.  Patient will report 75% improvement in low back pain to improve QOL.  Baseline: 5/10 Goal status: INITIAL  3.  Patient will be able to stand erect without increased low back pain.    Baseline: stand with flexed posture maintaining ~ 20 deg hip flexion.  Goal status: INITIAL  4.  Patient will demonstrate improved functional strength as demonstrated by 4+/5 RLE strength. Baseline: see objective Goal status: INITIAL  5.  Patient will report at least 6 points improvement on modified Oswestry to demonstrate improved functional ability.  Baseline: 22/50 Goal status: INITIAL   6.  Patient will tolerate 15 min of treadmill walking to start exercising at gym. Baseline: unable Goal status: INITIAL  7.   Patient will tolerate laying flat either supine or prone in order to return to massage therapy for pain  relief.  Baseline: unable to lay in prone or supine Goal status: INITIAL  8. Patient will increase gait speed to 1.0 m/s to improve community access.  Baseline: 0.6 m/s Goal status: INITIAL  9.  Patient will be able to lift and swing 8-10 lbs without LBP in order to return to bowling.   Baseline: unable Goal status: INITIAL  10.   Patient will score at least 19/24 on DGI to demonstrate lowr risk of falls.  Baseline: NT Goal status: INITIAL   PLAN:  PT FREQUENCY: 2x/week  PT DURATION: 8 weeks  PLANNED INTERVENTIONS: 97110-Therapeutic exercises, 97530- Therapeutic activity, 97112- Neuromuscular re-education, 97535- Self Care, [id number]- Manual therapy, [phone]- Gait training, [phone]- Aquatic Therapy, 97014- Electrical stimulation (unattended), [phone]- Electrical stimulation (manual), Patient/Family education, Balance training, Stair training, Taping, Joint mobilization, Joint manipulation, Spinal manipulation, Spinal mobilization, Scar mobilization, Cryotherapy, and Moist heat.  PLAN FOR NEXT SESSION: progress HEP - start in reclined or prone over pillows, hip flexor stretches, incline walking, hip  mobs, thoracic mobs?  Needs DGI   Almarie JINNY Sprinkles, PT, DPT  05/23/2023, 4:04 PM

## 2023-05-23 ENCOUNTER — Encounter: Payer: Self-pay | Admitting: Physical Therapy

## 2023-05-23 ENCOUNTER — Ambulatory Visit: Payer: Medicare PPO | Attending: Internal Medicine | Admitting: Physical Therapy

## 2023-05-23 ENCOUNTER — Other Ambulatory Visit: Payer: Self-pay

## 2023-05-23 DIAGNOSIS — M6281 Muscle weakness (generalized): Secondary | ICD-10-CM | POA: Diagnosis not present

## 2023-05-23 DIAGNOSIS — R262 Difficulty in walking, not elsewhere classified: Secondary | ICD-10-CM | POA: Insufficient documentation

## 2023-05-23 DIAGNOSIS — M5459 Other low back pain: Secondary | ICD-10-CM | POA: Insufficient documentation

## 2023-05-23 DIAGNOSIS — R293 Abnormal posture: Secondary | ICD-10-CM | POA: Insufficient documentation

## 2023-05-27 ENCOUNTER — Ambulatory Visit: Payer: Medicare PPO | Admitting: Physical Therapy

## 2023-05-27 ENCOUNTER — Encounter: Payer: Self-pay | Admitting: Physical Therapy

## 2023-05-27 DIAGNOSIS — R262 Difficulty in walking, not elsewhere classified: Secondary | ICD-10-CM

## 2023-05-27 DIAGNOSIS — M6281 Muscle weakness (generalized): Secondary | ICD-10-CM | POA: Diagnosis not present

## 2023-05-27 DIAGNOSIS — M5459 Other low back pain: Secondary | ICD-10-CM

## 2023-05-27 DIAGNOSIS — R293 Abnormal posture: Secondary | ICD-10-CM

## 2023-05-27 NOTE — Therapy (Signed)
 OUTPATIENT PHYSICAL THERAPY THORACOLUMBAR Treatment   Patient Name: Kyle Delacruz MRN: [id number] DOB:May 16, 1955, 68 y.o., male Today's Date: 05/27/2023  END OF SESSION:  PT End of Session - 05/27/23 1316     Visit Number 2    Date for PT Re-Evaluation 07/18/23    Authorization Type [employer] MCR + BCBS ([employer] auth 8 visits, BCBS VL 50)    Authorization - Visit Number 8    Authorization - Number of Visits 2    Progress Note Due on Visit 10    PT Start Time 1317    PT Stop Time 1359    PT Time Calculation (min) 42 min    Activity Tolerance Patient tolerated treatment well    Behavior During Therapy WFL for tasks assessed/performed             Past Medical History:  Diagnosis Date   Allergy    Arthritis    osteo   CTS (carpal tunnel syndrome)    DJD (degenerative joint disease)    DM2 (diabetes mellitus, type 2) (HCC)    DVT (deep venous thrombosis) (HCC)    GERD (gastroesophageal reflux disease)    Gout    History of adenomatous polyps of colon 12/29/2019   History of nuclear stress test    Myoview  11/17: EF 54, no ST changes, hypertensive blood pressure response, no ischemia, low risk   Hyperlipidemia    Hypothyroidism    Nicotine addiction    OSA (obstructive sleep apnea)    Prostatitis    Reactive airway disease    Sarcocystosis    Sinus arrhythmia    Sleep apnea    cpap- not wearing   Tinea corporis    Past Surgical History:  Procedure Laterality Date   BILATERAL KNEE ARTHROSCOPY     right- 1997; left 2001   COLONOSCOPY  multiple last 2013   IVC FILTER INSERTION N/A 09/17/2022   Procedure: IVC FILTER INSERTION;  Surgeon: Sheree Penne Bruckner, MD;  Location: [facility] INVASIVE CV LAB;  Service: Cardiovascular;  Laterality: N/A;   IVC FILTER REMOVAL N/A 01/28/2023   Procedure: IVC FILTER REMOVAL;  Surgeon: Sheree Penne Bruckner, MD;  Location: [facility] INVASIVE CV LAB;  Service: Cardiovascular;  Laterality: N/A;   KNEE SURGERY Right 2014   torn R medial  meniscus- arthroscopy done   LAPAROTOMY N/A 04/10/2022   Procedure: EXPLORATORY LAPAROTOMY, TOTAL COLECTOMY, END ILEOSTOMY;  Surgeon: Lyndel Deward PARAS, MD;  Location: [facility];  Service: General;  Laterality: N/A;   PARATHYROIDECTOMY  2005   ROTATOR CUFF REPAIR  2004   right   TONSILLECTOMY     TOTAL HIP ARTHROPLASTY Left 2009   TOTAL HIP ARTHROPLASTY Right 2006   UPPER GASTROINTESTINAL ENDOSCOPY     Patient Active Problem List   Diagnosis Date Noted   History of pulmonary embolism 06/28/2022   History of DVT (deep vein thrombosis) 06/28/2022   Lumbar radiculopathy, chronic 06/28/2022   Nausea and vomiting 06/28/2022   Septic shock (HCC) 06/26/2022   SVT (supraventricular tachycardia) (HCC) 06/19/2022   PAF (paroxysmal atrial fibrillation) (HCC) 06/18/2022   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   High output ileostomy (HCC) 04/21/2022   Chronic anticoagulation 04/21/2022   Normocytic anemia 04/10/2022   Shock (HCC) 04/10/2022   Deep vein thrombosis (DVT) of femoral vein of right lower extremity (HCC) 04/09/2022   Leg DVT (deep venous thromboembolism), chronic, right (HCC) 04/08/2022   Abdominal distension 04/08/2022   Acute respiratory failure with hypoxia (HCC) 07/27/2021   PE (pulmonary thromboembolism) (  HCC) 07/26/2021   Carcinoma of prostate (HCC) 07/10/2021   Chronic obstructive pulmonary disease (HCC) 07/10/2021   HLD (hyperlipidemia) 03/10/2021   Hypothyroidism 03/10/2021   Chronic bilateral back pain 03/10/2021   History of adenomatous polyps of colon 12/29/2019   Essential hypertension 06/13/2011   Obstructive sleep apnea (adult) (pediatric) 06/13/2011   Gastro-esophageal reflux disease without esophagitis 05/02/2009    PCP: Janey Santos, MD   REFERRING PROVIDER: Moffo, Katelyn Rose, PA*   REFERRING DIAG: [phone] (ICD-10-CM) - Fusion of spine of lumbar region   Rationale for Evaluation and Treatment: Rehabilitation  THERAPY DIAG:  Other low back pain  Muscle  weakness (generalized)  Abnormal posture  Difficulty in walking, not elsewhere classified  ONSET DATE: Back surgery 04/04/22, PE 04/08/22, 06/26/22 Sepsis, 11/05/22 Second back surgery to remove hardware and remove IVC filter  SUBJECTIVE:                                                                                                                                                                                           SUBJECTIVE STATEMENT: 05/27/23 Sore from the exercises.  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   FROM PZ:Uyzb said I need to strengthen my back muscles cause I can't stand up and walk like I want to, I know I can take a fall now, I was sitting around a fire circle and flipped back in a chair, got an x-ray and everything was good, didn't loosen the hardware, bone is healing though everything.  My neck has been a little sore from that too. Has had a lot of surgeries starting with original back surgery on 04/04/22, developed PE on 04/08/22, then developed sepsis on 06/26/22, had increased back pain starting in March 2024, cage in back came loose, had hardware removed and IVC on 11/05/22.  Can't lay flat because sleeping in recliner which also makes neck sore.  Would like to get back to swimming too.   PERTINENT HISTORY:  T2DM, osteoarthritis, history DVT, chronic anticoagulation, prostate cancer, HTN, HLD, COPD, OSA, GERD, hypothyroidism, chronic LBP, R THA 2006, L THA 2009, previous back surgery  PAIN:  Are you having pain? Yes: NPRS scale: 4/10 Pain location: low back  Pain description: ache Aggravating factors: standing and walking > 30 min  Relieving factors: sitting in recliner  PRECAUTIONS: Fall and Other: has ostomy  RED FLAGS: None   WEIGHT BEARING RESTRICTIONS: No  FALLS:  Has patient fallen in last 6 months? Yes. Number of falls 1 - fell backwards in chair and tripped on R foot, weakness in R ankle  DF  LIVING  ENVIRONMENT: Lives with: lives with their spouse Lives in: House/apartment Stairs: Yes: External: 6 steps; on left going up 2nd floor bonus room but does not go up those stairs, living area on 1st floor, 6 steps to garage Has following equipment at home: Tour manager, Grab bars, and scooter  OCCUPATION: retired from post office  PLOF: Independent and Leisure: swimming  PATIENT GOALS: return to swimming, be able to walk without pain, walk on treadmill, and bowl again  NEXT MD VISIT: in 5 months  OBJECTIVE:   DIAGNOSTIC FINDINGS:  11/06/22 XR Lumbar spine IMPRESSION:  1. L2-L5 posterior lumbar fusion with instrumentation and anterior  lumbar discectomy. Stable alignment.   PATIENT SURVEYS:  Modified Oswestry 22/50   COGNITION: Overall cognitive status: Within functional limits for tasks assessed     SENSATION: WFL  MUSCLE LENGTH: Hamstrings: significant tightness bilaterally  POSTURE: decreased lumbar lordosis, maintains 20 deg forward hip flexion  PALPATION: Diffuse tenderness low back   LUMBAR ROM:  lumbar AROM limited by fusion, no pain with flexion, unable to extend to neutral due to pain.   LOWER EXTREMITY ROM:   significant tightness bil hips R >L for both IR and ER.    LOWER EXTREMITY MMT:    MMT Right* eval Left* eval  Hip flexion 4 5  Hip extension 3 3  Hip abduction 4 5  Hip adduction 4+ 5  Knee flexion 5 5  Knee extension 5 5  Ankle dorsiflexion 3 5  Ankle plantarflexion 4 5   (Blank rows = not tested) * tested in sitting  LUMBAR SPECIAL TESTS:  NA   FUNCTIONAL TESTS:  5 times sit to stand: 15.6 sec    - 240'   Gait speed 0.6 m/s GAIT: Distance walked: 240' Assistive device utilized: None Level of assistance: Complete Independence Comments: wide BOS, forward flexed posture, visually slow  TODAY'S TREATMENT:                                                                                                                               DATE:  05/27/2023 Therapeutic Exercise: to improve strength and mobility.  Demo, verbal and tactile cues throughout for technique. Treadmill 0.8 mph, incline 2% x 6 min bil UE support Hip extensions x 20 r/l - bent over with bil UE support on counter Standing lumbar extension at counter x 20 Reclined hip IR/ER Reclined LTR Reclined hip flexor stretch Manual Therapy: to decrease muscle spasm and pain and improve mobility Bil hip PROM, GH joint distraction and mobs. IASTM to bil hip flexors during hip flexor stretch.   05/23/2023 EVAL  Self Care: Findings, POC, recommendations for aquatic therapy In reclined position on mat table - hip IR/ER windshield wipers x 10 - reported decreased LBP   PATIENT EDUCATION:  Education details: HEP review and update Person educated: Patient Education method: Explanation, Demonstration, and Handouts Education comprehension: verbalized understanding and returned demonstration  HOME  EXERCISE PROGRAM: Access Code: [email or web address] URL: [email or web address] Date: 05/27/2023 Prepared by: Almarie Sprinkles  Exercises - Supine Hip Internal and External Rotation  - 1 x daily - 7 x weekly - 1 sets - 10 reps - Standing Hip Extension with Counter Support  - 1 x daily - 7 x weekly - 2-3 sets - 10 reps - modified to bent over on counter.  - Standing Lumbar Extension with Counter  - 1 x daily - 7 x weekly - 1 sets - 10 reps - Modified Thomas Stretch  - 1 x daily - 7 x weekly - 1 sets - 3 reps - 1 min hold  ASSESSMENT:  CLINICAL IMPRESSION: REGIS HINTON  reports soreness today, overdoing initial exercises somewhat, had difficulty getting out of modified Debby position as held position for like an hour and then needed assistance from wife.  He was able to sleep in his bed with knees bent and head of bed elevated, which was much more comfortable on neck.  Today we reviewed his exercises, cautioned not to hold modified Thomas stretch more than about a  minute or so, and to do exercises to point of tightness not pain to prevent excessive soreness.  He demonstrated extremely weak hip extensors today with hip extension while leaning on counter.  Started working on hip mobility, tolerated well.  AREK SPADAFORE continues to demonstrate potential for improvement and would benefit from continued skilled therapy to address impairments.       OBJECTIVE IMPAIRMENTS: Abnormal gait, decreased activity tolerance, decreased balance, decreased endurance, decreased mobility, difficulty walking, decreased ROM, decreased strength, impaired perceived functional ability, increased muscle spasms, impaired flexibility, impaired sensation, postural dysfunction, and pain.   ACTIVITY LIMITATIONS: carrying, lifting, bending, standing, squatting, sleeping, stairs, transfers, bed mobility, and locomotion level  PARTICIPATION LIMITATIONS: meal prep, cleaning, laundry, driving, shopping, community activity, and occupation  PERSONAL FACTORS: Time since onset of injury/illness/exacerbation and 3+ comorbidities: T2DM, osteoarthritis, history DVT, chronic anticoagulation, prostate cancer, HTN, HLD, COPD, OSA, GERD, hypothyroidism, chronic LBP, R THA 2006, L THA 2009, previous back surgery  are also affecting patient's functional outcome.   REHAB POTENTIAL: Good  CLINICAL DECISION MAKING: Evolving/moderate complexity  EVALUATION COMPLEXITY: Moderate   GOALS: Goals reviewed with patient? Yes  SHORT TERM GOALS: Target date: 06/06/2023   Patient will be independent with initial HEP.  Baseline:  Goal status: IN PROGRESS   LONG TERM GOALS: Target date: 07/18/2023   Patient will be independent with advanced/ongoing HEP to improve outcomes and carryover.  Baseline:  Goal status: IN PROGRESS  2.  Patient will report 75% improvement in low back pain to improve QOL.  Baseline: 5/10 Goal status: IN PROGRESS  3.  Patient will be able to stand erect without increased low  back pain.    Baseline: stand with flexed posture maintaining ~ 20 deg hip flexion.  Goal status: IN PROGRESS  4.  Patient will demonstrate improved functional strength as demonstrated by 4+/5 RLE strength. Baseline: see objective Goal status: IN PROGRESS  5.  Patient will report at least 6 points improvement on modified Oswestry to demonstrate improved functional ability.  Baseline: 22/50 Goal status: IN PROGRESS   6.  Patient will tolerate 15 min of treadmill walking to start exercising at gym. Baseline: unable Goal status: IN PROGRESS  7.   Patient will tolerate laying flat either supine or prone in order to return to massage therapy for pain relief.  Baseline: unable to lay in prone or supine Goal status:  IN PROGRESS  8. Patient will increase gait speed to 1.0 m/s to improve community access.  Baseline: 0.6 m/s Goal status: IN PROGRESS  9.  Patient will be able to lift and swing 8-10 lbs without LBP in order to return to bowling.   Baseline: unable Goal status: IN PROGRESS  10.   Patient will score at least 19/24 on DGI to demonstrate lowr risk of falls.  Baseline: NT Goal status: IN PROGRESS   PLAN:  PT FREQUENCY: 2x/week  PT DURATION: 8 weeks  PLANNED INTERVENTIONS: 97110-Therapeutic exercises, 97530- Therapeutic activity, 97112- Neuromuscular re-education, 97535- Self Care, [id number]- Manual therapy, [phone]- Gait training, [phone]- Aquatic Therapy, [id number]- Electrical stimulation (unattended), [phone]- Electrical stimulation (manual), Patient/Family education, Balance training, Stair training, Taping, Joint mobilization, Joint manipulation, Spinal manipulation, Spinal mobilization, Scar mobilization, Cryotherapy, and Moist heat.  PLAN FOR NEXT SESSION: progress HEP - start in reclined or prone over pillows, hip flexor stretches, incline walking, hip mobs, thoracic mobs?  Needs DGI   Almarie JINNY Sprinkles, PT, DPT  05/27/2023, 3:14 PM

## 2023-05-29 ENCOUNTER — Ambulatory Visit: Payer: Medicare PPO

## 2023-05-29 DIAGNOSIS — R293 Abnormal posture: Secondary | ICD-10-CM | POA: Diagnosis not present

## 2023-05-29 DIAGNOSIS — M5459 Other low back pain: Secondary | ICD-10-CM | POA: Diagnosis not present

## 2023-05-29 DIAGNOSIS — R262 Difficulty in walking, not elsewhere classified: Secondary | ICD-10-CM | POA: Diagnosis not present

## 2023-05-29 DIAGNOSIS — M6281 Muscle weakness (generalized): Secondary | ICD-10-CM

## 2023-05-29 NOTE — Therapy (Signed)
 OUTPATIENT PHYSICAL THERAPY THORACOLUMBAR TREATMENT   Patient Name: Kyle Delacruz MRN: [id number] DOB:October 19, 1955, 68 y.o., male Today's Date: 05/29/2023  END OF SESSION:  PT End of Session - 05/29/23 1453     Visit Number 3    Date for PT Re-Evaluation 07/18/23    Authorization Type [employer] MCR + [employer] ([employer] auth 8 visits, BCBS VL 50)    Authorization - Number of Visits 2    Progress Note Due on Visit 10    PT Start Time 1403    PT Stop Time 1445    PT Time Calculation (min) 42 min    Activity Tolerance Patient tolerated treatment well    Behavior During Therapy WFL for tasks assessed/performed              Past Medical History:  Diagnosis Date   Allergy    Arthritis    osteo   CTS (carpal tunnel syndrome)    DJD (degenerative joint disease)    DM2 (diabetes mellitus, type 2) (HCC)    DVT (deep venous thrombosis) (HCC)    GERD (gastroesophageal reflux disease)    Gout    History of adenomatous polyps of colon 12/29/2019   History of nuclear stress test    Myoview  11/17: EF 54, no ST changes, hypertensive blood pressure response, no ischemia, low risk   Hyperlipidemia    Hypothyroidism    Nicotine addiction    OSA (obstructive sleep apnea)    Prostatitis    Reactive airway disease    Sarcocystosis    Sinus arrhythmia    Sleep apnea    cpap- not wearing   Tinea corporis    Past Surgical History:  Procedure Laterality Date   BILATERAL KNEE ARTHROSCOPY     right- 1997; left 2001   COLONOSCOPY  multiple last 2013   IVC FILTER INSERTION N/A 09/17/2022   Procedure: IVC FILTER INSERTION;  Surgeon: Adine Hoof, MD;  Location: [facility] INVASIVE CV LAB;  Service: Cardiovascular;  Laterality: N/A;   IVC FILTER REMOVAL N/A 01/28/2023   Procedure: IVC FILTER REMOVAL;  Surgeon: Adine Hoof, MD;  Location: [facility] INVASIVE CV LAB;  Service: Cardiovascular;  Laterality: N/A;   KNEE SURGERY Right 2014   torn R medial meniscus- arthroscopy done    LAPAROTOMY N/A 04/10/2022   Procedure: EXPLORATORY LAPAROTOMY, TOTAL COLECTOMY, END ILEOSTOMY;  Surgeon: Junie Olds, MD;  Location: [facility];  Service: General;  Laterality: N/A;   PARATHYROIDECTOMY  2005   ROTATOR CUFF REPAIR  2004   right   TONSILLECTOMY     TOTAL HIP ARTHROPLASTY Left 2009   TOTAL HIP ARTHROPLASTY Right 2006   UPPER GASTROINTESTINAL ENDOSCOPY     Patient Active Problem List   Diagnosis Date Noted   History of pulmonary embolism 06/28/2022   History of DVT (deep vein thrombosis) 06/28/2022   Lumbar radiculopathy, chronic 06/28/2022   Nausea and vomiting 06/28/2022   Septic shock (HCC) 06/26/2022   SVT (supraventricular tachycardia) (HCC) 06/19/2022   PAF (paroxysmal atrial fibrillation) (HCC) 06/18/2022   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   High output ileostomy (HCC) 04/21/2022   Chronic anticoagulation 04/21/2022   Normocytic anemia 04/10/2022   Shock (HCC) 04/10/2022   Deep vein thrombosis (DVT) of femoral vein of right lower extremity (HCC) 04/09/2022   Leg DVT (deep venous thromboembolism), chronic, right (HCC) 04/08/2022   Abdominal distension 04/08/2022   Acute respiratory failure with hypoxia (HCC) 07/27/2021   PE (pulmonary thromboembolism) (HCC) 07/26/2021   Carcinoma of prostate (  HCC) 07/10/2021   Chronic obstructive pulmonary disease (HCC) 07/10/2021   HLD (hyperlipidemia) 03/10/2021   Hypothyroidism 03/10/2021   Chronic bilateral back pain 03/10/2021   History of adenomatous polyps of colon 12/29/2019   Essential hypertension 06/13/2011   Obstructive sleep apnea (adult) (pediatric) 06/13/2011   Gastro-esophageal reflux disease without esophagitis 05/02/2009    PCP: Lonzie Robins, MD   REFERRING PROVIDER: Moffo, Katelyn Rose, PA*   REFERRING DIAG: [phone] (ICD-10-CM) - Fusion of spine of lumbar region   Rationale for Evaluation and Treatment: Rehabilitation  THERAPY DIAG:  Other low back pain  Muscle weakness  (generalized)  Abnormal posture  Difficulty in walking, not elsewhere classified  ONSET DATE: Back surgery 04/04/22, PE 04/08/22, 06/26/22 Sepsis, 11/05/22 Second back surgery to remove hardware and remove IVC filter  SUBJECTIVE:                                                                                                                                                                                           SUBJECTIVE STATEMENT: Pt reports he is sore from last visit.   FROM XB:JYNW said I need to strengthen my back muscles cause I can't stand up and walk like I want to, I know I can take a fall now, I was sitting around a fire circle and flipped back in a chair, got an x-ray and everything was good, didn't loosen the hardware, bone is healing though everything.  My neck has been a little sore from that too. Has had a lot of surgeries starting with original back surgery on 04/04/22, developed PE on 04/08/22, then developed sepsis on 06/26/22, had increased back pain starting in March 2024, cage in back came loose, had hardware removed and IVC on 11/05/22.  Can't lay flat because sleeping in recliner which also makes neck sore.  Would like to get back to swimming too.   PERTINENT HISTORY:  T2DM, osteoarthritis, history DVT, chronic anticoagulation, prostate cancer, HTN, HLD, COPD, OSA, GERD, hypothyroidism, chronic LBP, R THA 2006, L THA 2009, previous back surgery  PAIN:  Are you having pain? Yes: NPRS scale: 5-6/10 Pain location: low back  Pain description: ache Aggravating factors: standing and walking > 30 min  Relieving factors: sitting in recliner  PRECAUTIONS: Fall and Other: has ostomy  RED FLAGS: None   WEIGHT BEARING RESTRICTIONS: No  FALLS:  Has patient fallen in last 6 months? Yes. Number of falls 1 - fell backwards in chair and tripped on R foot, weakness in R ankle DF  LIVING ENVIRONMENT: Lives with: lives with their spouse Lives in: House/apartment Stairs: Yes:  External: 6 steps; on left going  up 2nd floor bonus room but does not go up those stairs, living area on 1st floor, 6 steps to garage Has following equipment at home: Shower bench, Grab bars, and scooter  OCCUPATION: retired from post office  PLOF: Independent and Leisure: swimming  PATIENT GOALS: return to swimming, be able to walk without pain, walk on treadmill, and bowl again  NEXT MD VISIT: in 5 months  OBJECTIVE:   DIAGNOSTIC FINDINGS:  11/06/22 XR Lumbar spine IMPRESSION:  1. L2-L5 posterior lumbar fusion with instrumentation and anterior  lumbar discectomy. Stable alignment.   PATIENT SURVEYS:  Modified Oswestry 22/50   COGNITION: Overall cognitive status: Within functional limits for tasks assessed     SENSATION: WFL  MUSCLE LENGTH: Hamstrings: significant tightness bilaterally  POSTURE: decreased lumbar lordosis, maintains 20 deg forward hip flexion  PALPATION: Diffuse tenderness low back   LUMBAR ROM:  lumbar AROM limited by fusion, no pain with flexion, unable to extend to neutral due to pain.   LOWER EXTREMITY ROM:   significant tightness bil hips R >L for both IR and ER.    LOWER EXTREMITY MMT:    MMT Right* eval Left* eval  Hip flexion 4 5  Hip extension 3 3  Hip abduction 4 5  Hip adduction 4+ 5  Knee flexion 5 5  Knee extension 5 5  Ankle dorsiflexion 3 5  Ankle plantarflexion 4 5   (Blank rows = not tested) * tested in sitting  LUMBAR SPECIAL TESTS:  NA   FUNCTIONAL TESTS:  5 times sit to stand: 15.6 sec    - 240'   Gait speed 0.6 m/s GAIT: Distance walked: 240' Assistive device utilized: None Level of assistance: Complete Independence Comments: wide BOS, forward flexed posture, visually slow  TODAY'S TREATMENT:                                                                                                                              DATE:  05/29/23 Therapeutic Exercise: to improve strength and mobility.  Demo, verbal and  tactile cues throughout for technique. Nustep L5x39min UE/LE Reclined hip ER/IR x 10 bil Reclined LTR x 10 Reclined quad sets 10x5" bil  Manual Therapy: to decrease muscle spasm and pain and improve mobility  (Reclined with wedge):  PROM to Bil hips Passive stretching for hamstrings  05/27/2023 Therapeutic Exercise: to improve strength and mobility.  Demo, verbal and tactile cues throughout for technique. Treadmill 0.8 mph, incline 2% x 6 min bil UE support Hip extensions x 20 r/l - bent over with bil UE support on counter Standing lumbar extension at counter x 20 Reclined hip IR/ER Reclined LTR Reclined hip flexor stretch Manual Therapy: to decrease muscle spasm and pain and improve mobility Bil hip PROM, GH joint distraction and mobs. IASTM to bil hip flexors during hip flexor stretch.   05/23/2023 EVAL  Self Care: Findings, POC, recommendations for aquatic therapy In reclined position on mat table -  hip IR/ER windshield wipers x 10 - reported decreased LBP   PATIENT EDUCATION:  Education details: HEP review and update Person educated: Patient Education method: Explanation, Demonstration, and Handouts Education comprehension: verbalized understanding and returned demonstration  HOME EXERCISE PROGRAM: Access Code: [email or web address] URL: [email or web address] Date: 05/27/2023 Prepared by: Randa Burton  Exercises - Supine Hip Internal and External Rotation  - 1 x daily - 7 x weekly - 1 sets - 10 reps - Standing Hip Extension with Counter Support  - 1 x daily - 7 x weekly - 2-3 sets - 10 reps - modified to bent over on counter.  - Standing Lumbar Extension with Counter  - 1 x daily - 7 x weekly - 1 sets - 10 reps - Modified Thomas Stretch  - 1 x daily - 7 x weekly - 1 sets - 3 reps - 1 min hold  ASSESSMENT:  CLINICAL IMPRESSION: Pt with reports of slightly increased soreness in his low back today. Mostly focused on manual stretches as he is very tight in his hips.  Exercises focused on hip mobility as well. Will attempt more core activation interventions next visit to improve lumbar stability. MATHEWS MOW continues to demonstrate potential for improvement and would benefit from continued skilled therapy to address impairments.       OBJECTIVE IMPAIRMENTS: Abnormal gait, decreased activity tolerance, decreased balance, decreased endurance, decreased mobility, difficulty walking, decreased ROM, decreased strength, impaired perceived functional ability, increased muscle spasms, impaired flexibility, impaired sensation, postural dysfunction, and pain.   ACTIVITY LIMITATIONS: carrying, lifting, bending, standing, squatting, sleeping, stairs, transfers, bed mobility, and locomotion level  PARTICIPATION LIMITATIONS: meal prep, cleaning, laundry, driving, shopping, community activity, and occupation  PERSONAL FACTORS: Time since onset of injury/illness/exacerbation and 3+ comorbidities: T2DM, osteoarthritis, history DVT, chronic anticoagulation, prostate cancer, HTN, HLD, COPD, OSA, GERD, hypothyroidism, chronic LBP, R THA 2006, L THA 2009, previous back surgery  are also affecting patient's functional outcome.   REHAB POTENTIAL: Good  CLINICAL DECISION MAKING: Evolving/moderate complexity  EVALUATION COMPLEXITY: Moderate   GOALS: Goals reviewed with patient? Yes  SHORT TERM GOALS: Target date: 06/06/2023   Patient will be independent with initial HEP.  Baseline:  Goal status: IN PROGRESS   LONG TERM GOALS: Target date: 07/18/2023   Patient will be independent with advanced/ongoing HEP to improve outcomes and carryover.  Baseline:  Goal status: IN PROGRESS  2.  Patient will report 75% improvement in low back pain to improve QOL.  Baseline: 5/10 Goal status: IN PROGRESS  3.  Patient will be able to stand erect without increased low back pain.    Baseline: stand with flexed posture maintaining ~ 20 deg hip flexion.  Goal status: IN  PROGRESS  4.  Patient will demonstrate improved functional strength as demonstrated by 4+/5 RLE strength. Baseline: see objective Goal status: IN PROGRESS  5.  Patient will report at least 6 points improvement on modified Oswestry to demonstrate improved functional ability.  Baseline: 22/50 Goal status: IN PROGRESS   6.  Patient will tolerate 15 min of treadmill walking to start exercising at gym. Baseline: unable Goal status: IN PROGRESS  7.   Patient will tolerate laying flat either supine or prone in order to return to massage therapy for pain relief.  Baseline: unable to lay in prone or supine Goal status: IN PROGRESS  8. Patient will increase gait speed to 1.0 m/s to improve community access.  Baseline: 0.6 m/s Goal status: IN PROGRESS  9.  Patient will be able  to lift and swing 8-10 lbs without LBP in order to return to bowling.   Baseline: unable Goal status: IN PROGRESS  10.   Patient will score at least 19/24 on DGI to demonstrate lowr risk of falls.  Baseline: NT Goal status: IN PROGRESS   PLAN:  PT FREQUENCY: 2x/week  PT DURATION: 8 weeks  PLANNED INTERVENTIONS: 97110-Therapeutic exercises, 97530- Therapeutic activity, 97112- Neuromuscular re-education, 97535- Self Care, [id number]- Manual therapy, [phone]- Gait training, [phone]- Aquatic Therapy, [id number]- Electrical stimulation (unattended), [phone]- Electrical stimulation (manual), Patient/Family education, Balance training, Stair training, Taping, Joint mobilization, Joint manipulation, Spinal manipulation, Spinal mobilization, Scar mobilization, Cryotherapy, and Moist heat.  PLAN FOR NEXT SESSION: try gentle core activation; progress HEP - start in reclined or prone over pillows, hip flexor stretches, incline walking, hip mobs, thoracic mobs?  Needs DGI   Clotee Schlicker L Daishawn Lauf, PTA 05/29/2023, 2:57 PM

## 2023-05-30 DIAGNOSIS — Z932 Ileostomy status: Secondary | ICD-10-CM | POA: Diagnosis not present

## 2023-05-30 DIAGNOSIS — K631 Perforation of intestine (nontraumatic): Secondary | ICD-10-CM | POA: Diagnosis not present

## 2023-06-04 ENCOUNTER — Ambulatory Visit: Payer: Medicare PPO

## 2023-06-04 DIAGNOSIS — M5459 Other low back pain: Secondary | ICD-10-CM | POA: Diagnosis not present

## 2023-06-04 DIAGNOSIS — R262 Difficulty in walking, not elsewhere classified: Secondary | ICD-10-CM

## 2023-06-04 DIAGNOSIS — M6281 Muscle weakness (generalized): Secondary | ICD-10-CM

## 2023-06-04 DIAGNOSIS — R293 Abnormal posture: Secondary | ICD-10-CM

## 2023-06-04 NOTE — Therapy (Signed)
OUTPATIENT PHYSICAL THERAPY THORACOLUMBAR TREATMENT   Patient Name: Kyle Delacruz MRN: [id number] DOB:11/04/1955, 68 y.o., male Today's Date: 06/04/2023  END OF SESSION:  PT End of Session - 06/04/23 1446     Visit Number 4    Date for PT Re-Evaluation 07/18/23    Authorization Type [employer] MCR + [employer] ([employer] auth 8 visits, BCBS VL 50)    Authorization - Visit Number 3    Authorization - Number of Visits 8    Progress Note Due on Visit 10    PT Start Time 1400    PT Stop Time 1444    PT Time Calculation (min) 44 min    Activity Tolerance Patient tolerated treatment well    Behavior During Therapy WFL for tasks assessed/performed               Past Medical History:  Diagnosis Date   Allergy    Arthritis    osteo   CTS (carpal tunnel syndrome)    DJD (degenerative joint disease)    DM2 (diabetes mellitus, type 2) (HCC)    DVT (deep venous thrombosis) (HCC)    GERD (gastroesophageal reflux disease)    Gout    History of adenomatous polyps of colon 12/29/2019   History of nuclear stress test    Myoview 11/17: EF 54, no ST changes, hypertensive blood pressure response, no ischemia, low risk   Hyperlipidemia    Hypothyroidism    Nicotine addiction    OSA (obstructive sleep apnea)    Prostatitis    Reactive airway disease    Sarcocystosis    Sinus arrhythmia    Sleep apnea    cpap- not wearing   Tinea corporis    Past Surgical History:  Procedure Laterality Date   BILATERAL KNEE ARTHROSCOPY     right- 1997; left 2001   COLONOSCOPY  multiple last 2013   IVC FILTER INSERTION N/A 09/17/2022   Procedure: IVC FILTER INSERTION;  Surgeon: Maeola Harman, MD;  Location: [facility] INVASIVE CV LAB;  Service: Cardiovascular;  Laterality: N/A;   IVC FILTER REMOVAL N/A 01/28/2023   Procedure: IVC FILTER REMOVAL;  Surgeon: Maeola Harman, MD;  Location: [facility] INVASIVE CV LAB;  Service: Cardiovascular;  Laterality: N/A;   KNEE SURGERY Right 2014   torn R medial  meniscus- arthroscopy done   LAPAROTOMY N/A 04/10/2022   Procedure: EXPLORATORY LAPAROTOMY, TOTAL COLECTOMY, END ILEOSTOMY;  Surgeon: Quentin Ore, MD;  Location: [facility];  Service: General;  Laterality: N/A;   PARATHYROIDECTOMY  2005   ROTATOR CUFF REPAIR  2004   right   TONSILLECTOMY     TOTAL HIP ARTHROPLASTY Left 2009   TOTAL HIP ARTHROPLASTY Right 2006   UPPER GASTROINTESTINAL ENDOSCOPY     Patient Active Problem List   Diagnosis Date Noted   History of pulmonary embolism 06/28/2022   History of DVT (deep vein thrombosis) 06/28/2022   Lumbar radiculopathy, chronic 06/28/2022   Nausea and vomiting 06/28/2022   Septic shock (HCC) 06/26/2022   SVT (supraventricular tachycardia) (HCC) 06/19/2022   PAF (paroxysmal atrial fibrillation) (HCC) 06/18/2022   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   High output ileostomy (HCC) 04/21/2022   Chronic anticoagulation 04/21/2022   Normocytic anemia 04/10/2022   Shock (HCC) 04/10/2022   Deep vein thrombosis (DVT) of femoral vein of right lower extremity (HCC) 04/09/2022   Leg DVT (deep venous thromboembolism), chronic, right (HCC) 04/08/2022   Abdominal distension 04/08/2022   Acute respiratory failure with hypoxia (HCC) 07/27/2021   PE (  pulmonary thromboembolism) (HCC) 07/26/2021   Carcinoma of prostate (HCC) 07/10/2021   Chronic obstructive pulmonary disease (HCC) 07/10/2021   HLD (hyperlipidemia) 03/10/2021   Hypothyroidism 03/10/2021   Chronic bilateral back pain 03/10/2021   History of adenomatous polyps of colon 12/29/2019   Essential hypertension 06/13/2011   Obstructive sleep apnea (adult) (pediatric) 06/13/2011   Gastro-esophageal reflux disease without esophagitis 05/02/2009    PCP: Chilton Greathouse, MD   REFERRING PROVIDER: Rosendo Gros, PA*   REFERRING DIAG: [phone] (ICD-10-CM) - Fusion of spine of lumbar region   Rationale for Evaluation and Treatment: Rehabilitation  THERAPY DIAG:  Other low back pain  Muscle  weakness (generalized)  Abnormal posture  Difficulty in walking, not elsewhere classified  ONSET DATE: Back surgery 04/04/22, PE 04/08/22, 06/26/22 Sepsis, 11/05/22 Second back surgery to remove hardware and remove IVC filter  SUBJECTIVE:                                                                                                                                                                                           SUBJECTIVE STATEMENT: Pt reports he is sore, he went to a party over the weekend, did ore walking and sat in uncomfortable chairs for a long time.  FROM FI:[facility] said I need to strengthen my back muscles cause I can't stand up and walk like I want to, I know I can take a fall now, I was sitting around a fire circle and flipped back in a chair, got an x-ray and everything was good, didn't loosen the hardware, bone is healing though everything.  My neck has been a little sore from that too. Has had a lot of surgeries starting with original back surgery on 04/04/22, developed PE on 04/08/22, then developed sepsis on 06/26/22, had increased back pain starting in March 2024, cage in back came loose, had hardware removed and IVC on 11/05/22.  Can't lay flat because sleeping in recliner which also makes neck sore.  Would like to get back to swimming too.   PERTINENT HISTORY:  T2DM, osteoarthritis, history DVT, chronic anticoagulation, prostate cancer, HTN, HLD, COPD, OSA, GERD, hypothyroidism, chronic LBP, R THA 2006, L THA 2009, previous back surgery  PAIN:  Are you having pain? Yes: NPRS scale: 5/10 Pain location: low back  Pain description: ache Aggravating factors: standing and walking > 30 min  Relieving factors: sitting in recliner  PRECAUTIONS: Fall and Other: has ostomy  RED FLAGS: None   WEIGHT BEARING RESTRICTIONS: No  FALLS:  Has patient fallen in last 6 months? Yes. Number of falls 1 - fell backwards in chair and tripped on R foot, weakness  in R ankle DF  LIVING  ENVIRONMENT: Lives with: lives with their spouse Lives in: House/apartment Stairs: Yes: External: 6 steps; on left going up 2nd floor bonus room but does not go up those stairs, living area on 1st floor, 6 steps to garage Has following equipment at home: Tour manager, Grab bars, and scooter  OCCUPATION: retired from post office  PLOF: Independent and Leisure: swimming  PATIENT GOALS: return to swimming, be able to walk without pain, walk on treadmill, and bowl again  NEXT MD VISIT: in 5 months  OBJECTIVE:   DIAGNOSTIC FINDINGS:  11/06/22 XR Lumbar spine IMPRESSION:  1. L2-L5 posterior lumbar fusion with instrumentation and anterior  lumbar discectomy. Stable alignment.   PATIENT SURVEYS:  Modified Oswestry 22/50   COGNITION: Overall cognitive status: Within functional limits for tasks assessed     SENSATION: WFL  MUSCLE LENGTH: Hamstrings: significant tightness bilaterally  POSTURE: decreased lumbar lordosis, maintains 20 deg forward hip flexion  PALPATION: Diffuse tenderness low back   LUMBAR ROM:  lumbar AROM limited by fusion, no pain with flexion, unable to extend to neutral due to pain.   LOWER EXTREMITY ROM:   significant tightness bil hips R >L for both IR and ER.    LOWER EXTREMITY MMT:    MMT Right* eval Left* eval  Hip flexion 4 5  Hip extension 3 3  Hip abduction 4 5  Hip adduction 4+ 5  Knee flexion 5 5  Knee extension 5 5  Ankle dorsiflexion 3 5  Ankle plantarflexion 4 5   (Blank rows = not tested) * tested in sitting  LUMBAR SPECIAL TESTS:  NA   FUNCTIONAL TESTS:  5 times sit to stand: 15.6 sec    - 240'   Gait speed 0.6 m/s GAIT: Distance walked: 240' Assistive device utilized: None Level of assistance: Complete Independence Comments: wide BOS, forward flexed posture, visually slow  TODAY'S TREATMENT:                                                                                                                               DATE:  05/29/23 Therapeutic Exercise: to improve strength and mobility.  Demo, verbal and tactile cues throughout for technique. Nustep L5x41min UE/LE Seated ball squeeze x 10 Seated hip ABD with gait belt x 10 at knees; additional x 10 at ankles Seated marching x 10 BLE 4 way weight shift x 10 at counter  Lumbar extension at counter x 10 STM to bil glutes in sitting  05/29/23 Therapeutic Exercise: to improve strength and mobility.  Demo, verbal and tactile cues throughout for technique. Nustep L5x68min UE/LE Reclined hip ER/IR x 10 bil Reclined LTR x 10 Reclined quad sets 10x5" bil  Manual Therapy: to decrease muscle spasm and pain and improve mobility  (Reclined with wedge):  PROM to Bil hips Passive stretching for hamstrings  05/27/2023 Therapeutic Exercise: to improve strength and mobility.  Demo, verbal and tactile cues  throughout for technique. Treadmill 0.8 mph, incline 2% x 6 min bil UE support Hip extensions x 20 r/l - bent over with bil UE support on counter Standing lumbar extension at counter x 20 Reclined hip IR/ER Reclined LTR Reclined hip flexor stretch Manual Therapy: to decrease muscle spasm and pain and improve mobility Bil hip PROM, GH joint distraction and mobs. IASTM to bil hip flexors during hip flexor stretch.   05/23/2023 EVAL  Self Care: Findings, POC, recommendations for aquatic therapy In reclined position on mat table - hip IR/ER windshield wipers x 10 - reported decreased LBP   PATIENT EDUCATION:  Education details: HEP review and update Person educated: Patient Education method: Explanation, Demonstration, and Handouts Education comprehension: verbalized understanding and returned demonstration  HOME EXERCISE PROGRAM: Access Code: [email or web address] URL: [email or web address] Date: 05/27/2023 Prepared by: Harrie Foreman  Exercises - Supine Hip Internal and External Rotation  - 1 x daily - 7 x weekly - 1 sets - 10 reps - Standing Hip  Extension with Counter Support  - 1 x daily - 7 x weekly - 2-3 sets - 10 reps - modified to bent over on counter.  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 Pt tolerated the exercises well. He was pretty tight in his glutes today with STM and reports that this helped with the tightness he experiences.  Kyle Delacruz continues to demonstrate potential for improvement and would benefit from continued skilled therapy to address impairments.       OBJECTIVE IMPAIRMENTS: Abnormal gait, decreased activity tolerance, decreased balance, decreased endurance, decreased mobility, difficulty walking, decreased ROM, decreased strength, impaired perceived functional ability, increased muscle spasms, impaired flexibility, impaired sensation, postural dysfunction, and pain.   ACTIVITY LIMITATIONS: carrying, lifting, bending, standing, squatting, sleeping, stairs, transfers, bed mobility, and locomotion level  PARTICIPATION LIMITATIONS: meal prep, cleaning, laundry, driving, shopping, community activity, and occupation  PERSONAL FACTORS: Time since onset of injury/illness/exacerbation and 3+ comorbidities: T2DM, osteoarthritis, history DVT, chronic anticoagulation, prostate cancer, HTN, HLD, COPD, OSA, GERD, hypothyroidism, chronic LBP, R THA 2006, L THA 2009, previous back surgery  are also affecting patient's functional outcome.   REHAB POTENTIAL: Good  CLINICAL DECISION MAKING: Evolving/moderate complexity  EVALUATION COMPLEXITY: Moderate   GOALS: Goals reviewed with patient? Yes  SHORT TERM GOALS: Target date: 06/06/2023   Patient will be independent with initial HEP.  Baseline:  Goal status: IN PROGRESS   LONG TERM GOALS: Target date: 07/18/2023   Patient will be independent  with advanced/ongoing HEP to improve outcomes and carryover.  Baseline:  Goal status: IN PROGRESS  2.  Patient will report 75% improvement in low back pain to improve QOL.  Baseline: 5/10 Goal status: IN PROGRESS  3.  Patient will be able to stand erect without increased low back pain.    Baseline: stand with flexed posture maintaining ~ 20 deg hip flexion.  Goal status: IN PROGRESS  4.  Patient will demonstrate improved functional strength as demonstrated by 4+/5 RLE strength. Baseline: see objective Goal status: IN PROGRESS  5.  Patient will report at least 6 points improvement on modified Oswestry to demonstrate improved functional ability.  Baseline: 22/50 Goal status: IN PROGRESS   6.  Patient will tolerate  15 min of treadmill walking to start exercising at gym. Baseline: unable Goal status: IN PROGRESS  7.   Patient will tolerate laying flat either supine or prone in order to return to massage therapy for pain relief.  Baseline: unable to lay in prone or supine Goal status: IN PROGRESS  8. Patient will increase gait speed to 1.0 m/s to improve community access.  Baseline: 0.6 m/s Goal status: IN PROGRESS  9.  Patient will be able to lift and swing 8-10 lbs without LBP in order to return to bowling.   Baseline: unable Goal status: IN PROGRESS  10.   Patient will score at least 19/24 on DGI to demonstrate lowr risk of falls.  Baseline: NT Goal status: IN PROGRESS   PLAN:  PT FREQUENCY: 2x/week  PT DURATION: 8 weeks  PLANNED INTERVENTIONS: 97110-Therapeutic exercises, 97530- Therapeutic activity, 97112- Neuromuscular re-education, 97535- Self Care, 16109- Manual therapy, [phone]- Gait training, [phone]- Aquatic Therapy, [id number]- Electrical stimulation (unattended), [phone]- Electrical stimulation (manual), Patient/Family education, Balance training, Stair training, Taping, Joint mobilization, Joint manipulation, Spinal manipulation, Spinal mobilization, Scar mobilization,  Cryotherapy, and Moist heat.  PLAN FOR NEXT SESSION: try gentle core activation; progress HEP - start in reclined or prone over pillows, hip flexor stretches, incline walking, hip mobs, thoracic mobs?  Needs DGI   Darleene Cleaver, PTA 06/04/2023, 2:48 PM

## 2023-06-06 ENCOUNTER — Encounter: Payer: Self-pay | Admitting: Physical Therapy

## 2023-06-06 ENCOUNTER — Ambulatory Visit: Payer: Medicare PPO | Admitting: Physical Therapy

## 2023-06-06 DIAGNOSIS — R293 Abnormal posture: Secondary | ICD-10-CM | POA: Diagnosis not present

## 2023-06-06 DIAGNOSIS — M6281 Muscle weakness (generalized): Secondary | ICD-10-CM

## 2023-06-06 DIAGNOSIS — R262 Difficulty in walking, not elsewhere classified: Secondary | ICD-10-CM | POA: Diagnosis not present

## 2023-06-06 DIAGNOSIS — M5459 Other low back pain: Secondary | ICD-10-CM

## 2023-06-06 NOTE — Patient Instructions (Signed)

## 2023-06-06 NOTE — Therapy (Signed)
OUTPATIENT PHYSICAL THERAPY THORACOLUMBAR TREATMENT   Patient Name: SIEGFRIED MERRIN MRN: [id number] DOB:February 15, 1956, 68 y.o., male Today's Date: 06/06/2023  END OF SESSION:  PT End of Session - 06/06/23 1322     Visit Number 5    Date for PT Re-Evaluation 07/18/23    Authorization Type [employer] MCR + [employer] ([employer] auth 8 visits, BCBS VL 50)    Authorization - Visit Number 5    Authorization - Number of Visits 8    Progress Note Due on Visit 10    PT Start Time 1319    PT Stop Time 1401    PT Time Calculation (min) 42 min    Activity Tolerance Patient tolerated treatment well    Behavior During Therapy WFL for tasks assessed/performed               Past Medical History:  Diagnosis Date   Allergy    Arthritis    osteo   CTS (carpal tunnel syndrome)    DJD (degenerative joint disease)    DM2 (diabetes mellitus, type 2) (HCC)    DVT (deep venous thrombosis) (HCC)    GERD (gastroesophageal reflux disease)    Gout    History of adenomatous polyps of colon 12/29/2019   History of nuclear stress test    Myoview 11/17: EF 54, no ST changes, hypertensive blood pressure response, no ischemia, low risk   Hyperlipidemia    Hypothyroidism    Nicotine addiction    OSA (obstructive sleep apnea)    Prostatitis    Reactive airway disease    Sarcocystosis    Sinus arrhythmia    Sleep apnea    cpap- not wearing   Tinea corporis    Past Surgical History:  Procedure Laterality Date   BILATERAL KNEE ARTHROSCOPY     right- 1997; left 2001   COLONOSCOPY  multiple last 2013   IVC FILTER INSERTION N/A 09/17/2022   Procedure: IVC FILTER INSERTION;  Surgeon: Maeola Harman, MD;  [facility] INVASIVE CV LAB;  Service: Cardiovascular;  Laterality: N/A;   IVC FILTER REMOVAL N/A 01/28/2023   Procedure: IVC FILTER REMOVAL;  Surgeon: Maeola Harman, MD;  Location: [facility] INVASIVE CV LAB;  Service: Cardiovascular;  Laterality: N/A;   KNEE SURGERY Right 2014   torn R medial  meniscus- arthroscopy done   LAPAROTOMY N/A 04/10/2022   Procedure: EXPLORATORY LAPAROTOMY, TOTAL COLECTOMY, END ILEOSTOMY;  Surgeon: Quentin Ore, MD;  Location: [facility];  Service: General;  Laterality: N/A;   PARATHYROIDECTOMY  2005   ROTATOR CUFF REPAIR  2004   right   TONSILLECTOMY     TOTAL HIP ARTHROPLASTY Left 2009   TOTAL HIP ARTHROPLASTY Right 2006   UPPER GASTROINTESTINAL ENDOSCOPY     Patient Active Problem List   Diagnosis Date Noted   History of pulmonary embolism 06/28/2022   History of DVT (deep vein thrombosis) 06/28/2022   Lumbar radiculopathy, chronic 06/28/2022   Nausea and vomiting 06/28/2022   Septic shock (HCC) 06/26/2022   SVT (supraventricular tachycardia) (HCC) 06/19/2022   PAF (paroxysmal atrial fibrillation) (HCC) 06/18/2022   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   High output ileostomy (HCC) 04/21/2022   Chronic anticoagulation 04/21/2022   Normocytic anemia 04/10/2022   Shock (HCC) 04/10/2022   Deep vein thrombosis (DVT) of femoral vein of right lower extremity (HCC) 04/09/2022   Leg DVT (deep venous thromboembolism), chronic, right (HCC) 04/08/2022   Abdominal distension 04/08/2022   Acute respiratory failure with hypoxia (HCC) 07/27/2021   PE (  pulmonary thromboembolism) (HCC) 07/26/2021   Carcinoma of prostate (HCC) 07/10/2021   Chronic obstructive pulmonary disease (HCC) 07/10/2021   HLD (hyperlipidemia) 03/10/2021   Hypothyroidism 03/10/2021   Chronic bilateral back pain 03/10/2021   History of adenomatous polyps of colon 12/29/2019   Essential hypertension 06/13/2011   Obstructive sleep apnea (adult) (pediatric) 06/13/2011   Gastro-esophageal reflux disease without esophagitis 05/02/2009    PCP: Chilton Greathouse, MD   REFERRING PROVIDER: Rosendo Gros, PA*   REFERRING DIAG: [id number] (ICD-10-CM) - Fusion of spine of lumbar region   Rationale for Evaluation and Treatment: Rehabilitation  THERAPY DIAG:  Other low back pain  Muscle  weakness (generalized)  Abnormal posture  Difficulty in walking, not elsewhere classified  ONSET DATE: Back surgery 04/04/22, PE 04/08/22, 06/26/22 Sepsis, 11/05/22 Second back surgery to remove hardware and remove IVC filter  SUBJECTIVE:                                                                                                                                                                                           SUBJECTIVE STATEMENT: Glutes are sore, but back is doing well.  Thought massage last session helped.   FROM IH:KVQQ said I need to strengthen my back muscles cause I can't stand up and walk like I want to, I know I can take a fall now, I was sitting around a fire circle and flipped back in a chair, got an x-ray and everything was good, didn't loosen the hardware, bone is healing though everything.  My neck has been a little sore from that too. Has had a lot of surgeries starting with original back surgery on 04/04/22, developed PE on 04/08/22, then developed sepsis on 06/26/22, had increased back pain starting in March 2024, cage in back came loose, had hardware removed and IVC on 11/05/22.  Can't lay flat because sleeping in recliner which also makes neck sore.  Would like to get back to swimming too.   PERTINENT HISTORY:  T2DM, osteoarthritis, history DVT, chronic anticoagulation, prostate cancer, HTN, HLD, COPD, OSA, GERD, hypothyroidism, chronic LBP, R THA 2006, L THA 2009, previous back surgery  PAIN:  Are you having pain? Yes: NPRS scale: 5/10 Pain location: low back/glutes  Pain description: ache Aggravating factors: standing and walking > 30 min  Relieving factors: sitting in recliner  PRECAUTIONS: Fall and Other: has ostomy  RED FLAGS: None   WEIGHT BEARING RESTRICTIONS: No  FALLS:  Has patient fallen in last 6 months? Yes. Number of falls 1 - fell backwards in chair and tripped on R foot, weakness in R ankle DF  LIVING ENVIRONMENT: Lives with: lives  with  their spouse Lives in: House/apartment Stairs: Yes: External: 6 steps; on left going up 2nd floor bonus room but does not go up those stairs, living area on 1st floor, 6 steps to garage Has following equipment at home: Shower bench, Grab bars, and scooter  OCCUPATION: retired from post office  PLOF: Independent and Leisure: swimming  PATIENT GOALS: return to swimming, be able to walk without pain, walk on treadmill, and bowl again  NEXT MD VISIT: in 5 months  OBJECTIVE:   DIAGNOSTIC FINDINGS:  11/06/22 XR Lumbar spine IMPRESSION:  1. L2-L5 posterior lumbar fusion with instrumentation and anterior  lumbar discectomy. Stable alignment.   PATIENT SURVEYS:  Modified Oswestry 22/50   COGNITION: Overall cognitive status: Within functional limits for tasks assessed     SENSATION: WFL  MUSCLE LENGTH: Hamstrings: significant tightness bilaterally  POSTURE: decreased lumbar lordosis, maintains 20 deg forward hip flexion  PALPATION: Diffuse tenderness low back   LUMBAR ROM:  lumbar AROM limited by fusion, no pain with flexion, unable to extend to neutral due to pain.   LOWER EXTREMITY ROM:   significant tightness bil hips R >L for both IR and ER.    LOWER EXTREMITY MMT:    MMT Right* eval Left* eval  Hip flexion 4 5  Hip extension 3 3  Hip abduction 4 5  Hip adduction 4+ 5  Knee flexion 5 5  Knee extension 5 5  Ankle dorsiflexion 3 5  Ankle plantarflexion 4 5   (Blank rows = not tested) * tested in sitting  LUMBAR SPECIAL TESTS:  NA   FUNCTIONAL TESTS:  5 times sit to stand: 15.6 sec    - 240'   Gait speed 0.6 m/s GAIT: Distance walked: 240' Assistive device utilized: None Level of assistance: Complete Independence Comments: wide BOS, forward flexed posture, visually slow  TODAY'S TREATMENT:                                                                                                                              DATE:  06/06/2023 Therapeutic Exercise:  to improve strength and mobility.  Demo, verbal and tactile cues throughout for technique. Treadmill incline 3 1.0 mph x 7 min Supine hip flexor stretches - partially reclined, 1 knee bent, while other leg partially supported while gradually increasing stretch.  Prone over 4 pillows for passive hip flexor stretch - started with 2 pillows but had to increase to 4 for comfort.  Manual Therapy: to decrease muscle spasm and pain and improve mobility STM/TPR to bil glutes, piriformis, IASTM with foam roller to glutes and hamstrings, bil QL, skilled palpation and monitoring during dry needling. Trigger Point Dry Needling  Initial Treatment: Pt instructed on Dry Needling rational, procedures, and possible side effects. Pt instructed to expect mild to moderate muscle soreness later in the day and/or into the next day.  Pt instructed in methods to reduce muscle soreness. Pt instructed to continue prescribed HEP.  Patient verbalized understanding of these instructions and education.   Patient Verbal Consent Given: Yes Education Handout Provided: Yes Muscles Treated: bil glut med Electrical Stimulation Performed: No Treatment Response/Outcome: Twitch Response Elicited and Palpable Increase in Muscle Length   06/04/23 Therapeutic Exercise: to improve strength and mobility.  Demo, verbal and tactile cues throughout for technique. Nustep L5x70min UE/LE Seated ball squeeze x 10 Seated hip ABD with gait belt x 10 at knees; additional x 10 at ankles Seated marching x 10 BLE 4 way weight shift x 10 at counter  Lumbar extension at counter x 10 STM to bil glutes in sitting  05/29/23 Therapeutic Exercise: to improve strength and mobility.  Demo, verbal and tactile cues throughout for technique. Nustep L5x94min UE/LE Reclined hip ER/IR x 10 bil Reclined LTR x 10 Reclined quad sets 10x5" bil  Manual Therapy: to decrease muscle spasm and pain and improve mobility  (Reclined with wedge):  PROM to Bil  hips Passive stretching for hamstrings   PATIENT EDUCATION:  Education details: HEP review and update Person educated: Patient Education method: Explanation, Demonstration, and Handouts Education comprehension: verbalized understanding and returned demonstration  HOME EXERCISE PROGRAM: Access Code: [email or web address] URL: [email or web address] Date: 05/27/2023 Prepared by: Harrie Foreman  Exercises - Supine Hip Internal and External Rotation  - 1 x daily - 7 x weekly - 1 sets - 10 reps - Standing Hip Extension with Counter Support  - 1 x daily - 7 x weekly - 2-3 sets - 10 reps - modified to bent over on counter.  - Standing Lumbar Extension with Counter  - 1 x daily - 7 x weekly - 1 sets - 10 reps - Modified Thomas Stretch  - 1 x daily - 7 x weekly - 1 sets - 3 reps - 1 min hold  ASSESSMENT:  CLINICAL IMPRESSION: Today was able to progress to prone over pillows, needed 4 pillows but patient reported this was comfortable but still provided good passive hip flexor stretch.  Noted significant tightness/banding in bilateral glutes where he reports significant soreness.  After explanation of DN rational, procedures, outcomes and potential side effects, patient verbalized consent to DN treatment in conjunction with manual STM/DTM and TPR to reduce ttp/muscle tension. Muscles treated as indicated above. DN produced normal response with good twitches elicited resulting in palpable reduction in pain/ttp and muscle tension, with patient noting less pain upon initiation of movement following DN. Pt educated to expect mild to moderate muscle soreness for up to 24-48 hrs and instructed to continue prescribed home exercise program and current activity level with pt verbalizing understanding of theses instructions.   HUSNAIN ZAHNOW continues to demonstrate potential for improvement and would benefit from continued skilled therapy to address impairments.       OBJECTIVE IMPAIRMENTS: Abnormal  gait, decreased activity tolerance, decreased balance, decreased endurance, decreased mobility, difficulty walking, decreased ROM, decreased strength, impaired perceived functional ability, increased muscle spasms, impaired flexibility, impaired sensation, postural dysfunction, and pain.   ACTIVITY LIMITATIONS: carrying, lifting, bending, standing, squatting, sleeping, stairs, transfers, bed mobility, and locomotion level  PARTICIPATION LIMITATIONS: meal prep, cleaning, laundry, driving, shopping, community activity, and occupation  PERSONAL FACTORS: Time since onset of injury/illness/exacerbation and 3+ comorbidities: T2DM, osteoarthritis, history DVT, chronic anticoagulation, prostate cancer, HTN, HLD, COPD, OSA, GERD, hypothyroidism, chronic LBP, R THA 2006, L THA 2009, previous back surgery  are also affecting patient's functional outcome.   REHAB POTENTIAL: Good  CLINICAL DECISION MAKING: Evolving/moderate complexity  EVALUATION COMPLEXITY: Moderate   GOALS:  Goals reviewed with patient? Yes  SHORT TERM GOALS: Target date: 06/06/2023   Patient will be independent with initial HEP.  Baseline:  Goal status: MET   LONG TERM GOALS: Target date: 07/18/2023   Patient will be independent with advanced/ongoing HEP to improve outcomes and carryover.  Baseline:  Goal status: IN PROGRESS  2.  Patient will report 75% improvement in low back pain to improve QOL.  Baseline: 5/10 Goal status: IN PROGRESS  3.  Patient will be able to stand erect without increased low back pain.    Baseline: stand with flexed posture maintaining ~ 20 deg hip flexion.  Goal status: IN PROGRESS  4.  Patient will demonstrate improved functional strength as demonstrated by 4+/5 RLE strength. Baseline: see objective Goal status: IN PROGRESS  5.  Patient will report at least 6 points improvement on modified Oswestry to demonstrate improved functional ability.  Baseline: 22/50 Goal status: IN PROGRESS   6.   Patient will tolerate 15 min of treadmill walking to start exercising at gym. Baseline: unable Goal status: IN PROGRESS 06/06/23 - 7 min on treadmill  7.   Patient will tolerate laying flat either supine or prone in order to return to massage therapy for pain relief.  Baseline: unable to lay in prone or supine Goal status: IN PROGRESS 06/06/23- can lay prone over 4 pillows  8. Patient will increase gait speed to 1.0 m/s to improve community access.  Baseline: 0.6 m/s Goal status: IN PROGRESS  9.  Patient will be able to lift and swing 8-10 lbs without LBP in order to return to bowling.   Baseline: unable Goal status: IN PROGRESS  10.   Patient will score at least 19/24 on DGI to demonstrate lowr risk of falls.  Baseline: NT Goal status: IN PROGRESS   PLAN:  PT FREQUENCY: 2x/week  PT DURATION: 8 weeks  PLANNED INTERVENTIONS: 97110-Therapeutic exercises, 97530- Therapeutic activity, 97112- Neuromuscular re-education, 97535- Self Care, 08657- Manual therapy, [phone]- Gait training, [phone]- Aquatic Therapy, 97014- Electrical stimulation (unattended), [phone]- Electrical stimulation (manual), Patient/Family education, Balance training, Stair training, Taping, Joint mobilization, Joint manipulation, Spinal manipulation, Spinal mobilization, Scar mobilization, Cryotherapy, and Moist heat.  PLAN FOR NEXT SESSION: try gentle core activation; progress HEP - start in reclined or prone over pillows, hip flexor stretches, incline walking, hip mobs, thoracic mobs?  Needs DGI   Jena Gauss, PT 06/06/2023, 2:21 PM

## 2023-06-07 ENCOUNTER — Other Ambulatory Visit: Payer: Self-pay | Admitting: Physician Assistant

## 2023-06-10 ENCOUNTER — Encounter (HOSPITAL_BASED_OUTPATIENT_CLINIC_OR_DEPARTMENT_OTHER): Payer: Self-pay | Admitting: Physical Therapy

## 2023-06-10 ENCOUNTER — Ambulatory Visit (HOSPITAL_BASED_OUTPATIENT_CLINIC_OR_DEPARTMENT_OTHER): Payer: Medicare PPO | Attending: Internal Medicine | Admitting: Physical Therapy

## 2023-06-10 DIAGNOSIS — R293 Abnormal posture: Secondary | ICD-10-CM | POA: Insufficient documentation

## 2023-06-10 DIAGNOSIS — M5459 Other low back pain: Secondary | ICD-10-CM | POA: Diagnosis not present

## 2023-06-10 DIAGNOSIS — M6281 Muscle weakness (generalized): Secondary | ICD-10-CM | POA: Insufficient documentation

## 2023-06-10 DIAGNOSIS — R262 Difficulty in walking, not elsewhere classified: Secondary | ICD-10-CM | POA: Diagnosis not present

## 2023-06-10 NOTE — Therapy (Signed)
OUTPATIENT PHYSICAL THERAPY THORACOLUMBAR TREATMENT   Patient Name: Kyle Delacruz MRN: [id number] DOB:1955/10/08, 68 y.o., male Today's Date: 06/10/2023  END OF SESSION:  PT End of Session - 06/10/23 1030     Visit Number 6    Date for PT Re-Evaluation 07/18/23    Authorization Type [employer] MCR + BCBS ([employer] auth 8 visits, BCBS VL 50)    Authorization - Visit Number 6    Authorization - Number of Visits 8    Progress Note Due on Visit 10    PT Start Time 1035    PT Stop Time 1120    PT Time Calculation (min) 45 min    Behavior During Therapy WFL for tasks assessed/performed               Past Medical History:  Diagnosis Date   Allergy    Arthritis    osteo   CTS (carpal tunnel syndrome)    DJD (degenerative joint disease)    DM2 (diabetes mellitus, type 2) (HCC)    DVT (deep venous thrombosis) (HCC)    GERD (gastroesophageal reflux disease)    Gout    History of adenomatous polyps of colon 12/29/2019   History of nuclear stress test    Myoview 11/17: EF 54, no ST changes, hypertensive blood pressure response, no ischemia, low risk   Hyperlipidemia    Hypothyroidism    Nicotine addiction    OSA (obstructive sleep apnea)    Prostatitis    Reactive airway disease    Sarcocystosis    Sinus arrhythmia    Sleep apnea    cpap- not wearing   Tinea corporis    Past Surgical History:  Procedure Laterality Date   BILATERAL KNEE ARTHROSCOPY     right- 1997; left 2001   COLONOSCOPY  multiple last 2013   IVC FILTER INSERTION N/A 09/17/2022   Procedure: IVC FILTER INSERTION;  Surgeon: Maeola Harman, MD;  Location: [facility] INVASIVE CV LAB;  Service: Cardiovascular;  Laterality: N/A;   IVC FILTER REMOVAL N/A 01/28/2023   Procedure: IVC FILTER REMOVAL;  Surgeon: Maeola Harman, MD;  Location: [facility] INVASIVE CV LAB;  Service: Cardiovascular;  Laterality: N/A;   KNEE SURGERY Right 2014   torn R medial meniscus- arthroscopy done   LAPAROTOMY N/A 04/10/2022    Procedure: EXPLORATORY LAPAROTOMY, TOTAL COLECTOMY, END ILEOSTOMY;  Surgeon: Quentin Ore, MD;  Location: [facility];  Service: General;  Laterality: N/A;   PARATHYROIDECTOMY  2005   ROTATOR CUFF REPAIR  2004   right   TONSILLECTOMY     TOTAL HIP ARTHROPLASTY Left 2009   TOTAL HIP ARTHROPLASTY Right 2006   UPPER GASTROINTESTINAL ENDOSCOPY     Patient Active Problem List   Diagnosis Date Noted   History of pulmonary embolism 06/28/2022   History of DVT (deep vein thrombosis) 06/28/2022   Lumbar radiculopathy, chronic 06/28/2022   Nausea and vomiting 06/28/2022   Septic shock (HCC) 06/26/2022   SVT (supraventricular tachycardia) (HCC) 06/19/2022   PAF (paroxysmal atrial fibrillation) (HCC) 06/18/2022   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   High output ileostomy (HCC) 04/21/2022   Chronic anticoagulation 04/21/2022   Normocytic anemia 04/10/2022   Shock (HCC) 04/10/2022   Deep vein thrombosis (DVT) of femoral vein of right lower extremity (HCC) 04/09/2022   Leg DVT (deep venous thromboembolism), chronic, right (HCC) 04/08/2022   Abdominal distension 04/08/2022   Acute respiratory failure with hypoxia (HCC) 07/27/2021   PE (pulmonary thromboembolism) (HCC) 07/26/2021   Carcinoma of prostate (  HCC) 07/10/2021   Chronic obstructive pulmonary disease (HCC) 07/10/2021   HLD (hyperlipidemia) 03/10/2021   Hypothyroidism 03/10/2021   Chronic bilateral back pain 03/10/2021   History of adenomatous polyps of colon 12/29/2019   Essential hypertension 06/13/2011   Obstructive sleep apnea (adult) (pediatric) 06/13/2011   Gastro-esophageal reflux disease without esophagitis 05/02/2009    PCP: Chilton Greathouse, MD   REFERRING PROVIDER: Rosendo Gros, PA*   REFERRING DIAG: [phone] (ICD-10-CM) - Fusion of spine of lumbar region   Rationale for Evaluation and Treatment: Rehabilitation  THERAPY DIAG:  Other low back pain  Muscle weakness (generalized)  Abnormal posture  Difficulty  in walking, not elsewhere classified  ONSET DATE: Back surgery 04/04/22, PE 04/08/22, 06/26/22 Sepsis, 11/05/22 Second back surgery to remove hardware and remove IVC filter  SUBJECTIVE:                                                                                                                                                                                           SUBJECTIVE STATEMENT: Pt reports no new changes since last session.    POOL ACCESS:  Pt has membership to [facility] with silver sneakers. Pt reports he hasn't been to pool since fall 2024.   FROM GU:YQIH said I need to strengthen my back muscles cause I can't stand up and walk like I want to, I know I can take a fall now, I was sitting around a fire circle and flipped back in a chair, got an x-ray and everything was good, didn't loosen the hardware, bone is healing though everything.  My neck has been a little sore from that too. Has had a lot of surgeries starting with original back surgery on 04/04/22, developed PE on 04/08/22, then developed sepsis on 06/26/22, had increased back pain starting in March 2024, cage in back came loose, had hardware removed and IVC on 11/05/22.  Can't lay flat because sleeping in recliner which also makes neck sore.  Would like to get back to swimming too.   PERTINENT HISTORY:  T2DM, osteoarthritis, history DVT, chronic anticoagulation, prostate cancer, HTN, HLD, COPD, OSA, GERD, hypothyroidism, chronic LBP, R THA 2006, L THA 2009, previous back surgery  PAIN:  Are you having pain? Yes: NPRS scale: 4/10 Pain location: low back/glutes, neck  Pain description: ache Aggravating factors: standing and walking > 30 min  Relieving factors: sitting in recliner  PRECAUTIONS: Fall and Other: has ostomy  RED FLAGS: None   WEIGHT BEARING RESTRICTIONS: No  FALLS:  Has patient fallen in last 6 months? Yes. Number of falls 1 - fell backwards in chair and tripped on R foot, weakness  in R ankle DF  LIVING  ENVIRONMENT: Lives with: lives with their spouse Lives in: House/apartment Stairs: Yes: External: 6 steps; on left going up 2nd floor bonus room but does not go up those stairs, living area on 1st floor, 6 steps to garage Has following equipment at home: Tour manager, Grab bars, and scooter  OCCUPATION: retired from post office  PLOF: Independent and Leisure: swimming  PATIENT GOALS: return to swimming, be able to walk without pain, walk on treadmill, and bowl again  NEXT MD VISIT: in 5 months  OBJECTIVE:   DIAGNOSTIC FINDINGS:  11/06/22 XR Lumbar spine IMPRESSION:  1. L2-L5 posterior lumbar fusion with instrumentation and anterior  lumbar discectomy. Stable alignment.   PATIENT SURVEYS:  Modified Oswestry 22/50   COGNITION: Overall cognitive status: Within functional limits for tasks assessed     SENSATION: WFL  MUSCLE LENGTH: Hamstrings: significant tightness bilaterally  POSTURE: decreased lumbar lordosis, maintains 20 deg forward hip flexion  PALPATION: Diffuse tenderness low back   LUMBAR ROM:  lumbar AROM limited by fusion, no pain with flexion, unable to extend to neutral due to pain.   LOWER EXTREMITY ROM:   significant tightness bil hips R >L for both IR and ER.    LOWER EXTREMITY MMT:    MMT Right* eval Left* eval  Hip flexion 4 5  Hip extension 3 3  Hip abduction 4 5  Hip adduction 4+ 5  Knee flexion 5 5  Knee extension 5 5  Ankle dorsiflexion 3 5  Ankle plantarflexion 4 5   (Blank rows = not tested) * tested in sitting  LUMBAR SPECIAL TESTS:  NA   FUNCTIONAL TESTS:  5 times sit to stand: 15.6 sec    - 240'   Gait speed 0.6 m/s GAIT: Distance walked: 240' Assistive device utilized: None Level of assistance: Complete Independence Comments: wide BOS, forward flexed posture, visually slow  TODAY'S TREATMENT:                                                                                                                               DATE: 06/10/2023 Pt seen for aquatic therapy today.  Treatment took place in water 3.5-4.75 ft in depth at the [facility] pool. Temp of water was 91.  Pt entered/exited the pool via stairs independently with bilat rail. - introduction to aquatic therapy/ properties and principles - submerged 80%, unsupported: walking forward/ backwards x 3 laps,  side stepping with arm addct  - rainbow hand floats at side, walking forward/ backward for lower trap and core engagement  - side stepping with arm addct/ abdct with rainbow hand floats and arm addct/ abdct.  - relaxed squat with vertical trunk  - plank position with hands on bench in water x 20 sec x 3 for stretching low back/trunk to neutral - bow and arrow with step back, x 8 each side - moderate cues for technique - staggered stance with bilat  horiz abdct/ addct with rainbow hand floats x 8 each LE forward - straddling yellow  noodle with / without UE support on wall - attempted cycling, but unable to coordinate RLE.   Pt requires the buoyancy and hydrostatic pressure of water for support, and to offload joints by unweighting joint load by at least 50 % in navel deep water and by at least 75-80% in chest to neck deep water.  Viscosity of the water is needed for resistance of strengthening. Water current perturbations provides challenge to standing balance requiring increased core activation.    DATE:  06/06/2023 Therapeutic Exercise: to improve strength and mobility.  Demo, verbal and tactile cues throughout for technique. Treadmill incline 3 1.0 mph x 7 min Supine hip flexor stretches - partially reclined, 1 knee bent, while other leg partially supported while gradually increasing stretch.  Prone over 4 pillows for passive hip flexor stretch - started with 2 pillows but had to increase to 4 for comfort.  Manual Therapy: to decrease muscle spasm and pain and improve mobility STM/TPR to bil glutes, piriformis, IASTM with foam roller to  glutes and hamstrings, bil QL, skilled palpation and monitoring during dry needling. Trigger Point Dry Needling  Initial Treatment: Pt instructed on Dry Needling rational, procedures, and possible side effects. Pt instructed to expect mild to moderate muscle soreness later in the day and/or into the next day.  Pt instructed in methods to reduce muscle soreness. Pt instructed to continue prescribed HEP. Patient verbalized understanding of these instructions and education.   Patient Verbal Consent Given: Yes Education Handout Provided: Yes Muscles Treated: bil glut med Electrical Stimulation Performed: No Treatment Response/Outcome: Twitch Response Elicited and Palpable Increase in Muscle Length   06/04/23 Therapeutic Exercise: to improve strength and mobility.  Demo, verbal and tactile cues throughout for technique. Nustep L5x51min UE/LE Seated ball squeeze x 10 Seated hip ABD with gait belt x 10 at knees; additional x 10 at ankles Seated marching x 10 BLE 4 way weight shift x 10 at counter  Lumbar extension at counter x 10 STM to bil glutes in sitting  05/29/23 Therapeutic Exercise: to improve strength and mobility.  Demo, verbal and tactile cues throughout for technique. Nustep L5x41min UE/LE Reclined hip ER/IR x 10 bil Reclined LTR x 10 Reclined quad sets 10x5" bil  Manual Therapy: to decrease muscle spasm and pain and improve mobility  (Reclined with wedge):  PROM to Bil hips Passive stretching for hamstrings   PATIENT EDUCATION:  Education details: HEP review and update Person educated: Patient Education method: Explanation, Demonstration, and Handouts Education comprehension: verbalized understanding and returned demonstration  HOME EXERCISE PROGRAM: Access Code: [email or web address] URL: [email or web address] Date: 05/27/2023 Prepared by: Harrie Foreman  Exercises - Supine Hip Internal and External Rotation  - 1 x daily - 7 x weekly - 1 sets - 10 reps -  Standing Hip Extension with Counter Support  - 1 x daily - 7 x weekly - 2-3 sets - 10 reps - modified to bent over on counter.  - Standing Lumbar Extension with Counter  - 1 x daily - 7 x weekly - 1 sets - 10 reps - Modified Thomas Stretch  - 1 x daily - 7 x weekly - 1 sets - 3 reps - 1 min hold  ASSESSMENT:  CLINICAL IMPRESSION:    Kyle Delacruz demonstrates safety and independence in aquatic setting with therapsit instructing from deck. Pt demonstrates confidence in setting, moving throughout all depths easily. He is directed through  various movement patterns and trials in standing positions.  He report a good stretch along anterior core while in plank position in water. He has some difficulty keeping his (lower) face out of water due to his forward flexed posture. He reported resolution of back pain when in water, and reduction of neck pain to 2/10.  Plan to begin creating HEP for water that he can take to local pool; has silver sneakers membership to [facility].    Goals are ongoing.  Continues to demonstrate potential for improvement and would benefit from continued skilled therapy to address impairments.      OBJECTIVE IMPAIRMENTS: Abnormal gait, decreased activity tolerance, decreased balance, decreased endurance, decreased mobility, difficulty walking, decreased ROM, decreased strength, impaired perceived functional ability, increased muscle spasms, impaired flexibility, impaired sensation, postural dysfunction, and pain.   ACTIVITY LIMITATIONS: carrying, lifting, bending, standing, squatting, sleeping, stairs, transfers, bed mobility, and locomotion level  PARTICIPATION LIMITATIONS: meal prep, cleaning, laundry, driving, shopping, community activity, and occupation  PERSONAL FACTORS: Time since onset of injury/illness/exacerbation and 3+ comorbidities: T2DM, osteoarthritis, history DVT, chronic anticoagulation, prostate cancer, HTN, HLD, COPD, OSA, GERD, hypothyroidism, chronic LBP, R THA 2006,  L THA 2009, previous back surgery  are also affecting patient's functional outcome.   REHAB POTENTIAL: Good  CLINICAL DECISION MAKING: Evolving/moderate complexity  EVALUATION COMPLEXITY: Moderate   GOALS: Goals reviewed with patient? Yes  SHORT TERM GOALS: Target date: 06/06/2023   Patient will be independent with initial HEP.  Baseline:  Goal status: MET   LONG TERM GOALS: Target date: 07/18/2023   Patient will be independent with advanced/ongoing HEP to improve outcomes and carryover.  Baseline:  Goal status: IN PROGRESS  2.  Patient will report 75% improvement in low back pain to improve QOL.  Baseline: 5/10 Goal status: IN PROGRESS  3.  Patient will be able to stand erect without increased low back pain.    Baseline: stand with flexed posture maintaining ~ 20 deg hip flexion.  Goal status: IN PROGRESS  4.  Patient will demonstrate improved functional strength as demonstrated by 4+/5 RLE strength. Baseline: see objective Goal status: IN PROGRESS  5.  Patient will report at least 6 points improvement on modified Oswestry to demonstrate improved functional ability.  Baseline: 22/50 Goal status: IN PROGRESS   6.  Patient will tolerate 15 min of treadmill walking to start exercising at gym. Baseline: unable Goal status: IN PROGRESS 06/06/23 - 7 min on treadmill  7.   Patient will tolerate laying flat either supine or prone in order to return to massage therapy for pain relief.  Baseline: unable to lay in prone or supine Goal status: IN PROGRESS 06/06/23- can lay prone over 4 pillows  8. Patient will increase gait speed to 1.0 m/s to improve community access.  Baseline: 0.6 m/s Goal status: IN PROGRESS  9.  Patient will be able to lift and swing 8-10 lbs without LBP in order to return to bowling.   Baseline: unable Goal status: IN PROGRESS  10.   Patient will score at least 19/24 on DGI to demonstrate lowr risk of falls.  Baseline: NT Goal status: IN  PROGRESS   PLAN:  PT FREQUENCY: 2x/week  PT DURATION: 8 weeks  PLANNED INTERVENTIONS: 97110-Therapeutic exercises, 97530- Therapeutic activity, 97112- Neuromuscular re-education, 97535- Self Care, 16109- Manual therapy, [phone]- Gait training, [phone]- Aquatic Therapy, 97014- Electrical stimulation (unattended), [phone]- Electrical stimulation (manual), Patient/Family education, Balance training, Stair training, Taping, Joint mobilization, Joint manipulation, Spinal manipulation, Spinal mobilization, Scar mobilization,  Cryotherapy, and Moist heat.  PLAN FOR NEXT SESSION: try gentle core activation; progress HEP - start in reclined or prone over pillows, hip flexor stretches, incline walking, hip mobs, thoracic mobs?  Needs DGI

## 2023-06-14 DIAGNOSIS — J45998 Other asthma: Secondary | ICD-10-CM | POA: Diagnosis not present

## 2023-06-14 DIAGNOSIS — J309 Allergic rhinitis, unspecified: Secondary | ICD-10-CM | POA: Diagnosis not present

## 2023-06-14 DIAGNOSIS — R0981 Nasal congestion: Secondary | ICD-10-CM | POA: Diagnosis not present

## 2023-06-14 DIAGNOSIS — J069 Acute upper respiratory infection, unspecified: Secondary | ICD-10-CM | POA: Diagnosis not present

## 2023-06-14 DIAGNOSIS — Z1152 Encounter for screening for COVID-19: Secondary | ICD-10-CM | POA: Diagnosis not present

## 2023-06-14 DIAGNOSIS — R5383 Other fatigue: Secondary | ICD-10-CM | POA: Diagnosis not present

## 2023-06-14 DIAGNOSIS — R058 Other specified cough: Secondary | ICD-10-CM | POA: Diagnosis not present

## 2023-06-14 DIAGNOSIS — J09X2 Influenza due to identified novel influenza A virus with other respiratory manifestations: Secondary | ICD-10-CM | POA: Diagnosis not present

## 2023-06-18 ENCOUNTER — Ambulatory Visit (HOSPITAL_BASED_OUTPATIENT_CLINIC_OR_DEPARTMENT_OTHER): Payer: Federal, State, Local not specified - PPO | Admitting: Physical Therapy

## 2023-06-20 ENCOUNTER — Encounter (HOSPITAL_BASED_OUTPATIENT_CLINIC_OR_DEPARTMENT_OTHER): Payer: Self-pay | Admitting: Physical Therapy

## 2023-06-20 ENCOUNTER — Ambulatory Visit (HOSPITAL_BASED_OUTPATIENT_CLINIC_OR_DEPARTMENT_OTHER): Payer: Federal, State, Local not specified - PPO | Attending: Internal Medicine | Admitting: Physical Therapy

## 2023-06-20 DIAGNOSIS — M5459 Other low back pain: Secondary | ICD-10-CM | POA: Insufficient documentation

## 2023-06-20 DIAGNOSIS — R293 Abnormal posture: Secondary | ICD-10-CM | POA: Diagnosis present

## 2023-06-20 DIAGNOSIS — M6281 Muscle weakness (generalized): Secondary | ICD-10-CM | POA: Diagnosis present

## 2023-06-20 DIAGNOSIS — R262 Difficulty in walking, not elsewhere classified: Secondary | ICD-10-CM | POA: Diagnosis present

## 2023-06-20 NOTE — Therapy (Signed)
 OUTPATIENT PHYSICAL THERAPY THORACOLUMBAR TREATMENT   Patient Name: Kyle Delacruz MRN: [id number] DOB:10-07-1955, 68 y.o., male Today's Date: 06/20/2023  END OF SESSION:  PT End of Session - 06/20/23 1409     Visit Number 7    Date for PT Re-Evaluation 07/18/23    Progress Note Due on Visit 10    PT Start Time 1400    PT Stop Time 1440    PT Time Calculation (min) 40 min    Behavior During Therapy WFL for tasks assessed/performed               Past Medical History:  Diagnosis Date   Allergy    Arthritis    osteo   CTS (carpal tunnel syndrome)    DJD (degenerative joint disease)    DM2 (diabetes mellitus, type 2) (HCC)    DVT (deep venous thrombosis) (HCC)    GERD (gastroesophageal reflux disease)    Gout    History of adenomatous polyps of colon 12/29/2019   History of nuclear stress test    Myoview  11/17: EF 54, no ST changes, hypertensive blood pressure response, no ischemia, low risk   Hyperlipidemia    Hypothyroidism    Nicotine addiction    OSA (obstructive sleep apnea)    Prostatitis    Reactive airway disease    Sarcocystosis    Sinus arrhythmia    Sleep apnea    cpap- not wearing   Tinea corporis    Past Surgical History:  Procedure Laterality Date   BILATERAL KNEE ARTHROSCOPY     right- 1997; left 2001   COLONOSCOPY  multiple last 2013   IVC FILTER INSERTION N/A 09/17/2022   Procedure: IVC FILTER INSERTION;  Surgeon: Sheree Penne Bruckner, MD;  Location: [facility] INVASIVE CV LAB;  Service: Cardiovascular;  Laterality: N/A;   IVC FILTER REMOVAL N/A 01/28/2023   Procedure: IVC FILTER REMOVAL;  Surgeon: Sheree Penne Bruckner, MD;  Location: [facility] INVASIVE CV LAB;  Service: Cardiovascular;  Laterality: N/A;   KNEE SURGERY Right 2014   torn R medial meniscus- arthroscopy done   LAPAROTOMY N/A 04/10/2022   Procedure: EXPLORATORY LAPAROTOMY, TOTAL COLECTOMY, END ILEOSTOMY;  Surgeon: Lyndel Deward PARAS, MD;  Location: [facility];  Service: General;   Laterality: N/A;   PARATHYROIDECTOMY  2005   ROTATOR CUFF REPAIR  2004   right   TONSILLECTOMY     TOTAL HIP ARTHROPLASTY Left 2009   TOTAL HIP ARTHROPLASTY Right 2006   UPPER GASTROINTESTINAL ENDOSCOPY     Patient Active Problem List   Diagnosis Date Noted   History of pulmonary embolism 06/28/2022   History of DVT (deep vein thrombosis) 06/28/2022   Lumbar radiculopathy, chronic 06/28/2022   Nausea and vomiting 06/28/2022   Septic shock (HCC) 06/26/2022   SVT (supraventricular tachycardia) (HCC) 06/19/2022   PAF (paroxysmal atrial fibrillation) (HCC) 06/18/2022   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   High output ileostomy (HCC) 04/21/2022   Chronic anticoagulation 04/21/2022   Normocytic anemia 04/10/2022   Shock (HCC) 04/10/2022   Deep vein thrombosis (DVT) of femoral vein of right lower extremity (HCC) 04/09/2022   Leg DVT (deep venous thromboembolism), chronic, right (HCC) 04/08/2022   Abdominal distension 04/08/2022   Acute respiratory failure with hypoxia (HCC) 07/27/2021   PE (pulmonary thromboembolism) (HCC) 07/26/2021   Carcinoma of prostate (HCC) 07/10/2021   Chronic obstructive pulmonary disease (HCC) 07/10/2021   HLD (hyperlipidemia) 03/10/2021   Hypothyroidism 03/10/2021   Chronic bilateral back pain 03/10/2021   History of adenomatous polyps of  colon 12/29/2019   Essential hypertension 06/13/2011   Obstructive sleep apnea (adult) (pediatric) 06/13/2011   Gastro-esophageal reflux disease without esophagitis 05/02/2009    PCP: [name], [name], MD   REFERRING PROVIDER: Moffo, Katelyn Rose, PA*   REFERRING DIAG: [phone] (ICD-10-CM) - Fusion of spine of lumbar region   Rationale for Evaluation and Treatment: Rehabilitation  THERAPY DIAG:  Other low back pain  Muscle weakness (generalized)  Abnormal posture  Difficulty in walking, not elsewhere classified  ONSET DATE: Back surgery 04/04/22, PE 04/08/22, 06/26/22 Sepsis, 11/05/22 Second back surgery to remove  hardware and remove IVC filter  SUBJECTIVE:                                                                                                                                                                                           SUBJECTIVE STATEMENT: Pt reports he was sore after first aquatic session. He recently had the flu and felt like his muscles locked up.  He has not attended his fitness class on Saturdays since his illness.    POOL ACCESS:  Pt has membership to [facility] with silver sneakers. Pt reports he hasn't been to pool since fall 2024.   FROM PZ:Uyzb said I need to strengthen my back muscles cause I can't stand up and walk like I want to, I know I can take a fall now, I was sitting around a fire circle and flipped back in a chair, got an x-ray and everything was good, didn't loosen the hardware, bone is healing though everything.  My neck has been a little sore from that too. Has had a lot of surgeries starting with original back surgery on 04/04/22, developed PE on 04/08/22, then developed sepsis on 06/26/22, had increased back pain starting in March 2024, cage in back came loose, had hardware removed and IVC on 11/05/22.  Can't lay flat because sleeping in recliner which also makes neck sore.  Would like to get back to swimming too.   PERTINENT HISTORY:  T2DM, osteoarthritis, history DVT, chronic anticoagulation, prostate cancer, HTN, HLD, COPD, OSA, GERD, hypothyroidism, chronic LBP, R THA 2006, L THA 2009, previous back surgery  PAIN:  Are you having pain? Yes: NPRS scale: 3/10 Pain location: L butt bone pointing to L hip   Pain description: ache Aggravating factors: standing and walking > 30 min  Relieving factors: sitting in recliner  PRECAUTIONS: Fall and Other: has ostomy  RED FLAGS: None   WEIGHT BEARING RESTRICTIONS: No  FALLS:  Has patient fallen in last 6 months? Yes. Number of falls 1 - fell backwards in chair and tripped on R foot, weakness in R  ankle  DF  LIVING ENVIRONMENT: Lives with: lives with their spouse Lives in: House/apartment Stairs: Yes: External: 6 steps; on left going up 2nd floor bonus room but does not go up those stairs, living area on 1st floor, 6 steps to garage Has following equipment at home: Tour manager, Grab bars, and scooter  OCCUPATION: retired from post office  PLOF: Independent and Leisure: swimming  PATIENT GOALS: return to swimming, be able to walk without pain, walk on treadmill, and bowl again  NEXT MD VISIT: in 5 months  OBJECTIVE:   DIAGNOSTIC FINDINGS:  11/06/22 XR Lumbar spine IMPRESSION:  1. L2-L5 posterior lumbar fusion with instrumentation and anterior  lumbar discectomy. Stable alignment.   PATIENT SURVEYS:  Modified Oswestry 22/50   COGNITION: Overall cognitive status: Within functional limits for tasks assessed     SENSATION: WFL  MUSCLE LENGTH: Hamstrings: significant tightness bilaterally  POSTURE: decreased lumbar lordosis, maintains 20 deg forward hip flexion  PALPATION: Diffuse tenderness low back   LUMBAR ROM:  lumbar AROM limited by fusion, no pain with flexion, unable to extend to neutral due to pain.   LOWER EXTREMITY ROM:   significant tightness bil hips R >L for both IR and ER.    LOWER EXTREMITY MMT:    MMT Right* eval Left* eval  Hip flexion 4 5  Hip extension 3 3  Hip abduction 4 5  Hip adduction 4+ 5  Knee flexion 5 5  Knee extension 5 5  Ankle dorsiflexion 3 5  Ankle plantarflexion 4 5   (Blank rows = not tested) * tested in sitting  LUMBAR SPECIAL TESTS:  NA   FUNCTIONAL TESTS:  5 times sit to stand: 15.6 sec    - 240'   Gait speed 0.6 m/s GAIT: Distance walked: 240' Assistive device utilized: None Level of assistance: Complete Independence Comments: wide BOS, forward flexed posture, visually slow  TODAY'S TREATMENT:                                                                                                                               DATE: 06/20/2023 Pt seen for aquatic therapy today.  Treatment took place in water 3.5-4.75 ft in depth at the [facility] pool. Temp of water was 91.  Pt entered/exited the pool via stairs independently with bilat rail.  - submerged 80%, unsupported: walking forward/ backwards x 3 laps,   - side stepping with arm addct -> with arm addct with rainbow hand floats  - staggered stance with bilat horiz abdct/ addct with rainbow hand floats x 10 each LE forward - farmer carry with rainbow hand floats at side, marching forward/ backward for lower trap and core engagement  - UE On wall:  hip abdct/ addct x 15; hip ext to touch toe x 15 each  - plank position with hands on bench in water (yellow noodle under arms) x 10 LE hip ext each - staggered stance with noodle ->  kick board row - gentle trunk rotation with hands on noodle -TrA set with kick board submersion  -return to walking forward/ backward, unsupported   DATE: 06/10/2023 Pt seen for aquatic therapy today.  Treatment took place in water 3.5-4.75 ft in depth at the [facility] pool. Temp of water was 91.  Pt entered/exited the pool via stairs independently with bilat rail. - introduction to aquatic therapy/ properties and principles - submerged 80%, unsupported: walking forward/ backwards x 3 laps,  side stepping with arm addct  - rainbow hand floats at side, walking forward/ backward for lower trap and core engagement  - side stepping with arm addct/ abdct with rainbow hand floats and arm addct/ abdct.  - relaxed squat with vertical trunk  - plank position with hands on bench in water x 20 sec x 3 for stretching low back/trunk to neutral - bow and arrow with step back, x 8 each side - moderate cues for technique - staggered stance with bilat horiz abdct/ addct with rainbow hand floats x 8 each LE forward - straddling yellow  noodle with / without UE support on wall - attempted cycling, but unable to coordinate RLE.    Pt requires the buoyancy and hydrostatic pressure of water for support, and to offload joints by unweighting joint load by at least 50 % in navel deep water and by at least 75-80% in chest to neck deep water.  Viscosity of the water is needed for resistance of strengthening. Water current perturbations provides challenge to standing balance requiring increased core activation.    DATE:  06/06/2023 Therapeutic Exercise: to improve strength and mobility.  Demo, verbal and tactile cues throughout for technique. Treadmill incline 3 1.0 mph x 7 min Supine hip flexor stretches - partially reclined, 1 knee bent, while other leg partially supported while gradually increasing stretch.  Prone over 4 pillows for passive hip flexor stretch - started with 2 pillows but had to increase to 4 for comfort.  Manual Therapy: to decrease muscle spasm and pain and improve mobility STM/TPR to bil glutes, piriformis, IASTM with foam roller to glutes and hamstrings, bil QL, skilled palpation and monitoring during dry needling. Trigger Point Dry Needling  Initial Treatment: Pt instructed on Dry Needling rational, procedures, and possible side effects. Pt instructed to expect mild to moderate muscle soreness later in the day and/or into the next day.  Pt instructed in methods to reduce muscle soreness. Pt instructed to continue prescribed HEP. Patient verbalized understanding of these instructions and education.   Patient Verbal Consent Given: Yes Education Handout Provided: Yes Muscles Treated: bil glut med Electrical Stimulation Performed: No Treatment Response/Outcome: Twitch Response Elicited and Palpable Increase in Muscle Length   06/04/23 Therapeutic Exercise: to improve strength and mobility.  Demo, verbal and tactile cues throughout for technique. Nustep L5x61min UE/LE Seated ball squeeze x 10 Seated hip ABD with gait belt x 10 at knees; additional x 10 at ankles Seated marching x 10 BLE 4 way weight  shift x 10 at counter  Lumbar extension at counter x 10 STM to bil glutes in sitting  05/29/23 Therapeutic Exercise: to improve strength and mobility.  Demo, verbal and tactile cues throughout for technique. Nustep L5x57min UE/LE Reclined hip ER/IR x 10 bil Reclined LTR x 10 Reclined quad sets 10x5 bil  Manual Therapy: to decrease muscle spasm and pain and improve mobility  (Reclined with wedge):  PROM to Bil hips Passive stretching for hamstrings   PATIENT EDUCATION:  Education details:  aquatic exercise rationale and modifications Person educated: Patient Education method: Explanation, Demonstration, and Handouts Education comprehension: verbalized understanding and returned demonstration  HOME EXERCISE PROGRAM: Access Code: [email or web address] URL: [email or web address] Date: 05/27/2023 Prepared by: Almarie Sprinkles  Exercises - Supine Hip Internal and External Rotation  - 1 x daily - 7 x weekly - 1 sets - 10 reps - Standing Hip Extension with Counter Support  - 1 x daily - 7 x weekly - 2-3 sets - 10 reps - modified to bent over on counter.  - Standing Lumbar Extension with Counter  - 1 x daily - 7 x weekly - 1 sets - 10 reps - Modified Thomas Stretch  - 1 x daily - 7 x weekly - 1 sets - 3 reps - 1 min hold  ASSESSMENT:  CLINICAL IMPRESSION:    TYRAN HUSER reported a good stretch along anterior hips and low back while in supported plank position in water at bench.  He reported reduction of tightness in back/neck after completion of session, but pain remained unchanged.   Plan to begin creating HEP for water that he can take to local pool; has silver sneakers membership to [facility]. Wife may attend next session to assist with donning/doffing clothes (to speed it up).    Goals are ongoing.  Continues to demonstrate potential for improvement and would benefit from continued skilled therapy to address impairments.      OBJECTIVE IMPAIRMENTS: Abnormal gait, decreased  activity tolerance, decreased balance, decreased endurance, decreased mobility, difficulty walking, decreased ROM, decreased strength, impaired perceived functional ability, increased muscle spasms, impaired flexibility, impaired sensation, postural dysfunction, and pain.   ACTIVITY LIMITATIONS: carrying, lifting, bending, standing, squatting, sleeping, stairs, transfers, bed mobility, and locomotion level  PARTICIPATION LIMITATIONS: meal prep, cleaning, laundry, driving, shopping, community activity, and occupation  PERSONAL FACTORS: Time since onset of injury/illness/exacerbation and 3+ comorbidities: T2DM, osteoarthritis, history DVT, chronic anticoagulation, prostate cancer, HTN, HLD, COPD, OSA, GERD, hypothyroidism, chronic LBP, R THA 2006, L THA 2009, previous back surgery  are also affecting patient's functional outcome.   REHAB POTENTIAL: Good  CLINICAL DECISION MAKING: Evolving/moderate complexity  EVALUATION COMPLEXITY: Moderate   GOALS: Goals reviewed with patient? Yes  SHORT TERM GOALS: Target date: 06/06/2023   Patient will be independent with initial HEP.  Baseline:  Goal status: MET   LONG TERM GOALS: Target date: 07/18/2023   Patient will be independent with advanced/ongoing HEP to improve outcomes and carryover.  Baseline:  Goal status: IN PROGRESS  2.  Patient will report 75% improvement in low back pain to improve QOL.  Baseline: 5/10 Goal status: IN PROGRESS  3.  Patient will be able to stand erect without increased low back pain.    Baseline: stand with flexed posture maintaining ~ 20 deg hip flexion.  Goal status: IN PROGRESS  4.  Patient will demonstrate improved functional strength as demonstrated by 4+/5 RLE strength. Baseline: see objective Goal status: IN PROGRESS  5.  Patient will report at least 6 points improvement on modified Oswestry to demonstrate improved functional ability.  Baseline: 22/50 Goal status: IN PROGRESS   6.  Patient will  tolerate 15 min of treadmill walking to start exercising at gym. Baseline: unable Goal status: IN PROGRESS 06/06/23 - 7 min on treadmill  7.   Patient will tolerate laying flat either supine or prone in order to return to massage therapy for pain relief.  Baseline: unable to lay in prone or supine Goal status: IN PROGRESS 06/06/23- can lay prone  over 4 pillows  8. Patient will increase gait speed to 1.0 m/s to improve community access.  Baseline: 0.6 m/s Goal status: IN PROGRESS  9.  Patient will be able to lift and swing 8-10 lbs without LBP in order to return to bowling.   Baseline: unable Goal status: IN PROGRESS  10.   Patient will score at least 19/24 on DGI to demonstrate lowr risk of falls.  Baseline: NT Goal status: IN PROGRESS   PLAN:  PT FREQUENCY: 2x/week  PT DURATION: 8 weeks  PLANNED INTERVENTIONS: 97110-Therapeutic exercises, 97530- Therapeutic activity, 97112- Neuromuscular re-education, 97535- Self Care, [id number]- Manual therapy, [phone]- Gait training, [phone]- Aquatic Therapy, [id number]- Electrical stimulation (unattended), [phone]- Electrical stimulation (manual), Patient/Family education, Balance training, Stair training, Taping, Joint mobilization, Joint manipulation, Spinal manipulation, Spinal mobilization, Scar mobilization, Cryotherapy, and Moist heat.  PLAN FOR NEXT SESSION: try gentle core activation; progress HEP - start in reclined or prone over pillows, hip flexor stretches, incline walking, hip mobs, thoracic mobs?  Needs DGI  Delon Aquas, PTA 06/20/23 2:47 PM [facility] [address] [place], [place], [address] Phone: [phone]   Fax:  [phone]

## 2023-06-25 ENCOUNTER — Encounter (HOSPITAL_BASED_OUTPATIENT_CLINIC_OR_DEPARTMENT_OTHER): Payer: Self-pay | Admitting: Physical Therapy

## 2023-06-25 ENCOUNTER — Ambulatory Visit (HOSPITAL_BASED_OUTPATIENT_CLINIC_OR_DEPARTMENT_OTHER): Payer: Federal, State, Local not specified - PPO | Admitting: Physical Therapy

## 2023-06-25 DIAGNOSIS — M5459 Other low back pain: Secondary | ICD-10-CM

## 2023-06-25 DIAGNOSIS — M6281 Muscle weakness (generalized): Secondary | ICD-10-CM

## 2023-06-25 DIAGNOSIS — R293 Abnormal posture: Secondary | ICD-10-CM

## 2023-06-25 DIAGNOSIS — R262 Difficulty in walking, not elsewhere classified: Secondary | ICD-10-CM

## 2023-06-25 NOTE — Therapy (Signed)
OUTPATIENT PHYSICAL THERAPY THORACOLUMBAR TREATMENT   Patient Name: Kyle Delacruz MRN: [id number] DOB:[birth year], 68 y.o., male Today's Date: 06/25/2023  END OF SESSION:  PT End of Session - 06/25/23 1100     Visit Number 8    Date for PT Re-Evaluation 07/18/23    Progress Note Due on Visit 10    PT Start Time 1100    PT Stop Time 1153    PT Time Calculation (min) 53 min    Activity Tolerance Patient tolerated treatment well    Behavior During Therapy WFL for tasks assessed/performed               Past Medical History:  Diagnosis Date   Allergy    Arthritis    osteo   CTS (carpal tunnel syndrome)    DJD (degenerative joint disease)    DM2 (diabetes mellitus, type 2) (HCC)    DVT (deep venous thrombosis) (HCC)    GERD (gastroesophageal reflux disease)    Gout    History of adenomatous polyps of colon 12/29/2019   History of nuclear stress test    Myoview 11/17: EF 54, no ST changes, hypertensive blood pressure response, no ischemia, low risk   Hyperlipidemia    Hypothyroidism    Nicotine addiction    OSA (obstructive sleep apnea)    Prostatitis    Reactive airway disease    Sarcocystosis    Sinus arrhythmia    Sleep apnea    cpap- not wearing   Tinea corporis    Past Surgical History:  Procedure Laterality Date   BILATERAL KNEE ARTHROSCOPY     right- 1997; left 2001   COLONOSCOPY  multiple last 2013   IVC FILTER INSERTION N/A 09/17/2022   Procedure: IVC FILTER INSERTION;  Surgeon: Maeola Harman, MD;  Location: [facility] INVASIVE CV LAB;  Service: Cardiovascular;  Laterality: N/A;   IVC FILTER REMOVAL N/A 01/28/2023   Procedure: IVC FILTER REMOVAL;  Surgeon: Maeola Harman, MD;  Location: [facility] INVASIVE CV LAB;  Service: Cardiovascular;  Laterality: N/A;   KNEE SURGERY Right 2014   torn R medial meniscus- arthroscopy done   LAPAROTOMY N/A 04/10/2022   Procedure: EXPLORATORY LAPAROTOMY, TOTAL COLECTOMY, END ILEOSTOMY;  Surgeon: Quentin Ore, MD;  Location: [facility];  Service: General;  Laterality: N/A;   PARATHYROIDECTOMY  2005   ROTATOR CUFF REPAIR  2004   right   TONSILLECTOMY     TOTAL HIP ARTHROPLASTY Left 2009   TOTAL HIP ARTHROPLASTY Right 2006   UPPER GASTROINTESTINAL ENDOSCOPY     Patient Active Problem List   Diagnosis Date Noted   History of pulmonary embolism 06/28/2022   History of DVT (deep vein thrombosis) 06/28/2022   Lumbar radiculopathy, chronic 06/28/2022   Nausea and vomiting 06/28/2022   Septic shock (HCC) 06/26/2022   SVT (supraventricular tachycardia) (HCC) 06/19/2022   PAF (paroxysmal atrial fibrillation) (HCC) 06/18/2022   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   High output ileostomy (HCC) 04/21/2022   Chronic anticoagulation 04/21/2022   Normocytic anemia 04/10/2022   Shock (HCC) 04/10/2022   Deep vein thrombosis (DVT) of femoral vein of right lower extremity (HCC) 04/09/2022   Leg DVT (deep venous thromboembolism), chronic, right (HCC) 04/08/2022   Abdominal distension 04/08/2022   Acute respiratory failure with hypoxia (HCC) 07/27/2021   PE (pulmonary thromboembolism) (HCC) 07/26/2021   Carcinoma of prostate (HCC) 07/10/2021   Chronic obstructive pulmonary disease (HCC) 07/10/2021   HLD (hyperlipidemia) 03/10/2021   Hypothyroidism 03/10/2021   Chronic bilateral back  pain 03/10/2021   History of adenomatous polyps of colon 12/29/2019   Essential hypertension 06/13/2011   Obstructive sleep apnea (adult) (pediatric) 06/13/2011   Gastro-esophageal reflux disease without esophagitis 05/02/2009    PCP: Chilton Greathouse, MD   REFERRING PROVIDER: Chilton Greathouse, MD   REFERRING DIAG: M43.26 (ICD-10-CM) - Fusion of spine of lumbar region   Rationale for Evaluation and Treatment: Rehabilitation  THERAPY DIAG:  Other low back pain  Muscle weakness (generalized)  Abnormal posture  Difficulty in walking, not elsewhere classified  ONSET DATE: Back surgery 04/04/22, PE 04/08/22, 06/26/22  Sepsis, 11/05/22 Second back surgery to remove hardware and remove IVC filter  SUBJECTIVE:                                                                                                                                                                                           SUBJECTIVE STATEMENT: Pt reports generally feeling better after aqutics but does have slight increase in left sided LBP    POOL ACCESS:  Pt has membership to [employer] with silver sneakers. Pt reports he hasn't been to pool since fall 2024.   FROM ZO:XWRU said I need to strengthen my back muscles cause I can't stand up and walk like I want to, I know I can take a fall now, I was sitting around a fire circle and flipped back in a chair, got an x-ray and everything was good, didn't loosen the hardware, bone is healing though everything.  My neck has been a little sore from that too. Has had a lot of surgeries starting with original back surgery on 04/04/22, developed PE on 04/08/22, then developed sepsis on 06/26/22, had increased back pain starting in March 2024, cage in back came loose, had hardware removed and IVC on 11/05/22.  Can't lay flat because sleeping in recliner which also makes neck sore.  Would like to get back to swimming too.   PERTINENT HISTORY:  T2DM, osteoarthritis, history DVT, chronic anticoagulation, prostate cancer, HTN, HLD, COPD, OSA, GERD, hypothyroidism, chronic LBP, R THA 2006, L THA 2009, previous back surgery  PAIN:  Are you having pain? Yes: NPRS scale: 3/10 Pain location: L "butt bone" pointing to L hip   Pain description: ache Aggravating factors: standing and walking > 30 min  Relieving factors: sitting in recliner  PRECAUTIONS: Fall and Other: has ostomy  RED FLAGS: None   WEIGHT BEARING RESTRICTIONS: No  FALLS:  Has patient fallen in last 6 months? Yes. Number of falls 1 - fell backwards in chair and tripped on R foot, weakness in R ankle DF  LIVING ENVIRONMENT: Lives with: lives with  their spouse Lives in: House/apartment Stairs: Yes: External: 6 steps; on left going up 2nd floor bonus room but does not go up those stairs, living area on 1st floor, 6 steps to garage Has following equipment at home: Shower bench, Grab bars, and scooter  OCCUPATION: retired from post office  PLOF: Independent and Leisure: swimming  PATIENT GOALS: return to swimming, be able to walk without pain, walk on treadmill, and bowl again  NEXT MD VISIT: in 5 months  OBJECTIVE:   DIAGNOSTIC FINDINGS:  11/06/22 XR Lumbar spine IMPRESSION:  1. L2-L5 posterior lumbar fusion with instrumentation and anterior  lumbar discectomy. Stable alignment.   PATIENT SURVEYS:  Modified Oswestry 22/50   COGNITION: Overall cognitive status: Within functional limits for tasks assessed     SENSATION: WFL  MUSCLE LENGTH: Hamstrings: significant tightness bilaterally  POSTURE: decreased lumbar lordosis, maintains 20 deg forward hip flexion  PALPATION: Diffuse tenderness low back   LUMBAR ROM:  lumbar AROM limited by fusion, no pain with flexion, unable to extend to neutral due to pain.   LOWER EXTREMITY ROM:   significant tightness bil hips R >L for both IR and ER.    LOWER EXTREMITY MMT:    MMT Right* eval Left* eval  Hip flexion 4 5  Hip extension 3 3  Hip abduction 4 5  Hip adduction 4+ 5  Knee flexion 5 5  Knee extension 5 5  Ankle dorsiflexion 3 5  Ankle plantarflexion 4 5   (Blank rows = not tested) * tested in sitting  LUMBAR SPECIAL TESTS:  NA   FUNCTIONAL TESTS:  5 times sit to stand: 15.6 sec    - 240'   Gait speed 0.6 m/s GAIT: Distance walked: 240' Assistive device utilized: None Level of assistance: Complete Independence Comments: wide BOS, forward flexed posture, visually slow  TODAY'S TREATMENT:                                                                                                                              DATE: 06/25/2023 Pt seen for aquatic  therapy today.  Treatment took place in water 3.5-4.75 ft in depth at the [facility] pool. Temp of water was 91.  Pt entered/exited the pool via stairs independently with bilat rail.  - submerged 80%, unsupported: walking forward/ backwards  - L stretch; L stretch with tail wagging right for added Left sided LB stretch (in spasm) - 3 way hamstring stretch - side stepping with arm addct -> with arm addct with rainbow hand floats  - farmer carry with rainbow hand floats at side, marching forward/ backward and side stepping for lower trap and core engagement  - staggered stance with bilat horiz abdct/ addct with rainbow hand floats x 5 each LE forward - plank position with hands on bench in water (yellow noodle under arms) x 10 LE hip ext each; fire hydrants x 5 ea -suspended vertical then sup using yellow noodle wrapped  posteriorly across chest: cycling; hip add/abd; LAQ; hip flex/ext. Added nekdoodle for comfort.       DATE: 06/20/2023 Pt seen for aquatic therapy today.  Treatment took place in water 3.5-4.75 ft in depth at the [facility] pool. Temp of water was 91.  Pt entered/exited the pool via stairs independently with bilat rail.  - submerged 80%, unsupported: walking forward/ backwards x 3 laps,   - side stepping with arm addct -> with arm addct with rainbow hand floats  - staggered stance with bilat horiz abdct/ addct with rainbow hand floats x 10 each LE forward - farmer carry with rainbow hand floats at side, marching forward/ backward for lower trap and core engagement  - UE On wall:  hip abdct/ addct x 15; hip ext to touch toe x 15 each  - plank position with hands on bench in water (yellow noodle under arms) x 10 LE hip ext each - staggered stance with noodle -> kick board row - gentle trunk rotation with hands on noodle -TrA set with kick board submersion  -return to walking forward/ backward, unsupported   Pt requires the buoyancy and hydrostatic  pressure of water for support, and to offload joints by unweighting joint load by at least 50 % in navel deep water and by at least 75-80% in chest to neck deep water.  Viscosity of the water is needed for resistance of strengthening. Water current perturbations provides challenge to standing balance requiring increased core activation.   DATE: 06/10/2023 Pt seen for aquatic therapy today.  Treatment took place in water 3.5-4.75 ft in depth at the [facility] pool. Temp of water was 91.  Pt entered/exited the pool via stairs independently with bilat rail. - introduction to aquatic therapy/ properties and principles - submerged 80%, unsupported: walking forward/ backwards x 3 laps,  side stepping with arm addct  - rainbow hand floats at side, walking forward/ backward for lower trap and core engagement  - side stepping with arm addct/ abdct with rainbow hand floats and arm addct/ abdct.  - relaxed squat with vertical trunk  - plank position with hands on bench in water x 20 sec x 3 for stretching low back/trunk to neutral - bow and arrow with step back, x 8 each side - moderate cues for technique - staggered stance with bilat horiz abdct/ addct with rainbow hand floats x 8 each LE forward - straddling yellow  noodle with / without UE support on wall - attempted cycling, but unable to coordinate RLE.   Pt requires the buoyancy and hydrostatic pressure of water for support, and to offload joints by unweighting joint load by at least 50 % in navel deep water and by at least 75-80% in chest to neck deep water.  Viscosity of the water is needed for resistance of strengthening. Water current perturbations provides challenge to standing balance requiring increased core activation.    DATE:  06/06/2023 Therapeutic Exercise: to improve strength and mobility.  Demo, verbal and tactile cues throughout for technique. Treadmill incline 3 1.0 mph x 7 min Supine hip flexor stretches - partially  reclined, 1 knee bent, while other leg partially supported while gradually increasing stretch.  Prone over 4 pillows for passive hip flexor stretch - started with 2 pillows but had to increase to 4 for comfort.  Manual Therapy: to decrease muscle spasm and pain and improve mobility STM/TPR to bil glutes, piriformis, IASTM with foam roller to glutes and hamstrings, bil QL, skilled palpation and  monitoring during dry needling. Trigger Point Dry Needling  Initial Treatment: Pt instructed on Dry Needling rational, procedures, and possible side effects. Pt instructed to expect mild to moderate muscle soreness later in the day and/or into the next day.  Pt instructed in methods to reduce muscle soreness. Pt instructed to continue prescribed HEP. Patient verbalized understanding of these instructions and education.   Patient Verbal Consent Given: Yes Education Handout Provided: Yes Muscles Treated: bil glut med Electrical Stimulation Performed: No Treatment Response/Outcome: Twitch Response Elicited and Palpable Increase in Muscle Length   06/04/23 Therapeutic Exercise: to improve strength and mobility.  Demo, verbal and tactile cues throughout for technique. Nustep L5x37min UE/LE Seated ball squeeze x 10 Seated hip ABD with gait belt x 10 at knees; additional x 10 at ankles Seated marching x 10 BLE 4 way weight shift x 10 at counter  Lumbar extension at counter x 10 STM to bil glutes in sitting  05/29/23 Therapeutic Exercise: to improve strength and mobility.  Demo, verbal and tactile cues throughout for technique. Nustep L5x43min UE/LE Reclined hip ER/IR x 10 bil Reclined LTR x 10 Reclined quad sets 10x5" bil  Manual Therapy: to decrease muscle spasm and pain and improve mobility  (Reclined with wedge):  PROM to Bil hips Passive stretching for hamstrings   PATIENT EDUCATION:  Education details: aquatic exercise rationale and modifications Person educated: Patient Education method:  Explanation, Demonstration, and Handouts Education comprehension: verbalized understanding and returned demonstration  HOME EXERCISE PROGRAM: Access Code: [email or web address] URL: [email or web address] Date: 05/27/2023 Prepared by: Harrie Foreman  Exercises - Supine Hip Internal and External Rotation  - 1 x daily - 7 x weekly - 1 sets - 10 reps - Standing Hip Extension with Counter Support  - 1 x daily - 7 x weekly - 2-3 sets - 10 reps - modified to bent over on counter.  - Standing Lumbar Extension with Counter  - 1 x daily - 7 x weekly - 1 sets - 10 reps - Modified Thomas Stretch  - 1 x daily - 7 x weekly - 1 sets - 3 reps - 1 min hold  ASSESSMENT:  CLINICAL IMPRESSION:  Pt presents with left lb tightness/spasms.  Focused initially on stretching which reduces discomfort.  Post plank increased lb tightness and discomfort. Pt able to gain suspended position in vertical. Allowing for hip and knee free movement reduces pain.  Pt enjoys supine position able to exercise LE more efficiently. Goals ongoing     OBJECTIVE IMPAIRMENTS: Abnormal gait, decreased activity tolerance, decreased balance, decreased endurance, decreased mobility, difficulty walking, decreased ROM, decreased strength, impaired perceived functional ability, increased muscle spasms, impaired flexibility, impaired sensation, postural dysfunction, and pain.   ACTIVITY LIMITATIONS: carrying, lifting, bending, standing, squatting, sleeping, stairs, transfers, bed mobility, and locomotion level  PARTICIPATION LIMITATIONS: meal prep, cleaning, laundry, driving, shopping, community activity, and occupation  PERSONAL FACTORS: Time since onset of injury/illness/exacerbation and 3+ comorbidities: T2DM, osteoarthritis, history DVT, chronic anticoagulation, prostate cancer, HTN, HLD, COPD, OSA, GERD, hypothyroidism, chronic LBP, R THA 2006, L THA 2009, previous back surgery  are also affecting patient's functional outcome.    REHAB POTENTIAL: Good  CLINICAL DECISION MAKING: Evolving/moderate complexity  EVALUATION COMPLEXITY: Moderate   GOALS: Goals reviewed with patient? Yes  SHORT TERM GOALS: Target date: 06/06/2023   Patient will be independent with initial HEP.  Baseline:  Goal status: MET   LONG TERM GOALS: Target date: 07/18/2023   Patient will be independent with advanced/ongoing HEP to improve outcomes  and carryover.  Baseline:  Goal status: IN PROGRESS  2.  Patient will report 75% improvement in low back pain to improve QOL.  Baseline: 5/10 Goal status: IN PROGRESS  3.  Patient will be able to stand erect without increased low back pain.    Baseline: stand with flexed posture maintaining ~ 20 deg hip flexion.  Goal status: IN PROGRESS  4.  Patient will demonstrate improved functional strength as demonstrated by 4+/5 RLE strength. Baseline: see objective Goal status: IN PROGRESS  5.  Patient will report at least 6 points improvement on modified Oswestry to demonstrate improved functional ability.  Baseline: 22/50 Goal status: IN PROGRESS   6.  Patient will tolerate 15 min of treadmill walking to start exercising at gym. Baseline: unable Goal status: IN PROGRESS 06/06/23 - 7 min on treadmill  7.   Patient will tolerate laying flat either supine or prone in order to return to massage therapy for pain relief.  Baseline: unable to lay in prone or supine Goal status: IN PROGRESS 06/06/23- can lay prone over 4 pillows  8. Patient will increase gait speed to 1.0 m/s to improve community access.  Baseline: 0.6 m/s Goal status: IN PROGRESS  9.  Patient will be able to lift and swing 8-10 lbs without LBP in order to return to bowling.   Baseline: unable Goal status: IN PROGRESS  10.   Patient will score at least 19/24 on DGI to demonstrate lowr risk of falls.  Baseline: NT Goal status: IN PROGRESS   PLAN:  PT FREQUENCY: 2x/week  PT DURATION: 8 weeks  PLANNED INTERVENTIONS:  97110-Therapeutic exercises, 97530- Therapeutic activity, 97112- Neuromuscular re-education, 97535- Self Care, [id number]- Manual therapy, [phone]- Gait training, [phone]- Aquatic Therapy, 97014- Electrical stimulation (unattended), [phone]- Electrical stimulation (manual), Patient/Family education, Balance training, Stair training, Taping, Joint mobilization, Joint manipulation, Spinal manipulation, Spinal mobilization, Scar mobilization, Cryotherapy, and Moist heat.  PLAN FOR NEXT SESSION: try gentle core activation; progress HEP - start in reclined or prone over pillows, hip flexor stretches, incline walking, hip mobs, thoracic mobs?  Needs DGI  Rushie Chestnut) Mirra Basilio MPT 06/25/23 1:41 PM [facility] [address] [place], [place], [address] Phone: [phone]   Fax:  [phone]

## 2023-06-27 ENCOUNTER — Ambulatory Visit (HOSPITAL_BASED_OUTPATIENT_CLINIC_OR_DEPARTMENT_OTHER): Payer: Medicare PPO | Admitting: Physical Therapy

## 2023-06-28 ENCOUNTER — Encounter: Payer: Self-pay | Admitting: Physical Therapy

## 2023-06-28 ENCOUNTER — Ambulatory Visit: Payer: Federal, State, Local not specified - PPO | Attending: Internal Medicine | Admitting: Physical Therapy

## 2023-06-28 DIAGNOSIS — M6281 Muscle weakness (generalized): Secondary | ICD-10-CM | POA: Insufficient documentation

## 2023-06-28 DIAGNOSIS — R262 Difficulty in walking, not elsewhere classified: Secondary | ICD-10-CM | POA: Diagnosis present

## 2023-06-28 DIAGNOSIS — R293 Abnormal posture: Secondary | ICD-10-CM | POA: Insufficient documentation

## 2023-06-28 DIAGNOSIS — M5459 Other low back pain: Secondary | ICD-10-CM | POA: Diagnosis present

## 2023-06-28 NOTE — Therapy (Signed)
OUTPATIENT PHYSICAL THERAPY THORACOLUMBAR TREATMENT Progress Note Reporting Period 05/23/2023 to 06/28/2023   See note below for Objective Data and Assessment of Progress/Goals.      Patient Name: Kyle Delacruz MRN: [id number] DOB:Jul 11, 1955, 68 y.o., male Today's Date: 06/28/2023  END OF SESSION:  PT End of Session - 06/28/23 1107     Visit Number 9    Date for PT Re-Evaluation 07/18/23    Authorization Type [employer] MCR + BCBS  BCBS VL 50)    Authorization Time Period new auth needed?    Authorization - Visit Number --    Authorization - Number of Visits --    Progress Note Due on Visit 19    PT Start Time 1104    PT Stop Time 1149    PT Time Calculation (min) 45 min    Activity Tolerance Patient tolerated treatment well    Behavior During Therapy WFL for tasks assessed/performed               Past Medical History:  Diagnosis Date   Allergy    Arthritis    osteo   CTS (carpal tunnel syndrome)    DJD (degenerative joint disease)    DM2 (diabetes mellitus, type 2) (HCC)    DVT (deep venous thrombosis) (HCC)    GERD (gastroesophageal reflux disease)    Gout    History of adenomatous polyps of colon 12/29/2019   History of nuclear stress test    Myoview 11/17: EF 54, no ST changes, hypertensive blood pressure response, no ischemia, low risk   Hyperlipidemia    Hypothyroidism    Nicotine addiction    OSA (obstructive sleep apnea)    Prostatitis    Reactive airway disease    Sarcocystosis    Sinus arrhythmia    Sleep apnea    cpap- not wearing   Tinea corporis    Past Surgical History:  Procedure Laterality Date   BILATERAL KNEE ARTHROSCOPY     right- 1997; left 2001   COLONOSCOPY  multiple last 2013   IVC FILTER INSERTION N/A 09/17/2022   Procedure: IVC FILTER INSERTION;  Surgeon: Maeola Harman, MD;  Location: [facility] INVASIVE CV LAB;  Service: Cardiovascular;  Laterality: N/A;   IVC FILTER REMOVAL N/A 01/28/2023   Procedure: IVC FILTER  REMOVAL;  Surgeon: Maeola Harman, MD;  Location: [facility] INVASIVE CV LAB;  Service: Cardiovascular;  Laterality: N/A;   KNEE SURGERY Right 2014   torn R medial meniscus- arthroscopy done   LAPAROTOMY N/A 04/10/2022   Procedure: EXPLORATORY LAPAROTOMY, TOTAL COLECTOMY, END ILEOSTOMY;  Surgeon: Quentin Ore, MD;  Location: [facility];  Service: General;  Laterality: N/A;   PARATHYROIDECTOMY  2005   ROTATOR CUFF REPAIR  2004   right   TONSILLECTOMY     TOTAL HIP ARTHROPLASTY Left 2009   TOTAL HIP ARTHROPLASTY Right 2006   UPPER GASTROINTESTINAL ENDOSCOPY     Patient Active Problem List   Diagnosis Date Noted   History of pulmonary embolism 06/28/2022   History of DVT (deep vein thrombosis) 06/28/2022   Lumbar radiculopathy, chronic 06/28/2022   Nausea and vomiting 06/28/2022   Septic shock (HCC) 06/26/2022   SVT (supraventricular tachycardia) (HCC) 06/19/2022   PAF (paroxysmal atrial fibrillation) (HCC) 06/18/2022   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   High output ileostomy (HCC) 04/21/2022   Chronic anticoagulation 04/21/2022   Normocytic anemia 04/10/2022   Shock (HCC) 04/10/2022   Deep vein thrombosis (DVT) of femoral vein of right lower extremity (HCC)  04/09/2022   Leg DVT (deep venous thromboembolism), chronic, right (HCC) 04/08/2022   Abdominal distension 04/08/2022   Acute respiratory failure with hypoxia (HCC) 07/27/2021   PE (pulmonary thromboembolism) (HCC) 07/26/2021   Carcinoma of prostate (HCC) 07/10/2021   Chronic obstructive pulmonary disease (HCC) 07/10/2021   HLD (hyperlipidemia) 03/10/2021   Hypothyroidism 03/10/2021   Chronic bilateral back pain 03/10/2021   History of adenomatous polyps of colon 12/29/2019   Essential hypertension 06/13/2011   Obstructive sleep apnea (adult) (pediatric) 06/13/2011   Gastro-esophageal reflux disease without esophagitis 05/02/2009    PCP: Chilton Greathouse, MD   REFERRING PROVIDER: Rosendo Gros, PA*   REFERRING  DIAG: [phone] (ICD-10-CM) - Fusion of spine of lumbar region   Rationale for Evaluation and Treatment: Rehabilitation  THERAPY DIAG:  Other low back pain  Muscle weakness (generalized)  Abnormal posture  Difficulty in walking, not elsewhere classified  ONSET DATE: Back surgery 04/04/22, PE 04/08/22, 06/26/22 Sepsis, 11/05/22 Second back surgery to remove hardware and remove IVC filter  SUBJECTIVE:                                                                                                                                                                                           SUBJECTIVE STATEMENT: Still feeing that spot in his "left butt".  Enjoying aquatic therapy, feels great in pool but feels it afterwards.     POOL ACCESS:  Pt has membership to [facility] with silver sneakers. Pt reports he hasn't been to pool since fall 2024.   FROM QM:VHQI said I need to strengthen my back muscles cause I can't stand up and walk like I want to, I know I can take a fall now, I was sitting around a fire circle and flipped back in a chair, got an x-ray and everything was good, didn't loosen the hardware, bone is healing though everything.  My neck has been a little sore from that too. Has had a lot of surgeries starting with original back surgery on 04/04/22, developed PE on 04/08/22, then developed sepsis on 06/26/22, had increased back pain starting in March 2024, cage in back came loose, had hardware removed and IVC on 11/05/22.  Can't lay flat because sleeping in recliner which also makes neck sore.  Would like to get back to swimming too.   PERTINENT HISTORY:  T2DM, osteoarthritis, history DVT, chronic anticoagulation, prostate cancer, HTN, HLD, COPD, OSA, GERD, hypothyroidism, chronic LBP, R THA 2006, L THA 2009, previous back surgery  PAIN:  Are you having pain? Yes: NPRS scale: 3/10 Pain location: L "butt bone" pointing to L hip   Pain description: ache Aggravating  factors: standing and walking >  30 min  Relieving factors: sitting in recliner  PRECAUTIONS: Fall and Other: has ostomy  RED FLAGS: None   WEIGHT BEARING RESTRICTIONS: No  FALLS:  Has patient fallen in last 6 months? Yes. Number of falls 1 - fell backwards in chair and tripped on R foot, weakness in R ankle DF  LIVING ENVIRONMENT: Lives with: lives with their spouse Lives in: House/apartment Stairs: Yes: External: 6 steps; on left going up 2nd floor bonus room but does not go up those stairs, living area on 1st floor, 6 steps to garage Has following equipment at home: Tour manager, Grab bars, and scooter  OCCUPATION: retired from post office  PLOF: Independent and Leisure: swimming  PATIENT GOALS: return to swimming, be able to walk without pain, walk on treadmill, and bowl again  NEXT MD VISIT: in 5 months  OBJECTIVE:   DIAGNOSTIC FINDINGS:  11/06/22 XR Lumbar spine IMPRESSION:  1. L2-L5 posterior lumbar fusion with instrumentation and anterior  lumbar discectomy. Stable alignment.   PATIENT SURVEYS:  Modified Oswestry 22/50   COGNITION: Overall cognitive status: Within functional limits for tasks assessed     SENSATION: WFL  MUSCLE LENGTH: Hamstrings: significant tightness bilaterally  POSTURE: decreased lumbar lordosis, maintains 20 deg forward hip flexion  PALPATION: Diffuse tenderness low back   LUMBAR ROM:  lumbar AROM limited by fusion, no pain with flexion, unable to extend to neutral due to pain.   LOWER EXTREMITY ROM:   significant tightness bil hips R >L for both IR and ER.    LOWER EXTREMITY MMT:    MMT Right* eval Left* eval Right* 06/28/23  Hip flexion 4 5 4+  Hip extension 3 3   Hip abduction 4 5 4   Hip adduction 4+ 5 5  Knee flexion 5 5 5   Knee extension 5 5 5   Ankle dorsiflexion 3 5 3+  Ankle plantarflexion 4 5 4+   (Blank rows = not tested) * tested in sitting  LUMBAR SPECIAL TESTS:  NA   FUNCTIONAL TESTS:  5 times sit to stand: 15.6 sec    -  240'   Gait speed 0.6 m/s GAIT: Distance walked: 240' Assistive device utilized: None Level of assistance: Complete Independence Comments: wide BOS, forward flexed posture, visually slow   [facility] PT Assessment - 06/28/23 0001       Balance   Balance Assessed Yes      Standardized Balance Assessment   Standardized Balance Assessment Dynamic Gait Index      Dynamic Gait Index   Level Surface Mild Impairment    Change in Gait Speed Mild Impairment    Gait with Horizontal Head Turns Mild Impairment    Gait with Vertical Head Turns Normal    Gait and Pivot Turn Mild Impairment    Step Over Obstacle Normal    Step Around Obstacles Normal    Steps Mild Impairment    Total Score 19    DGI comment: scores 19 or less are predictive of falls in older community living adults             TODAY'S TREATMENT:  DATE:   06/28/23 Tests & Measures DGI MMT Modified Oswestry Gait Speed Therapeutic Exercise: to improve strength and mobility.  Demo, verbal and tactile cues throughout for technique. Treadmill Incline 2 speed 0.9 x 6 min for warm-up & subjective Self Care: Discussion of sleep positioning while positioned in supine with HOB elevated 15 deg and bolster under knees, importance of prioritizing sleep in bed instead of recliner for hip flexor stretching and cervical positioning.    06/25/2023 Pt seen for aquatic therapy today.  Treatment took place in water 3.5-4.75 ft in depth at the [facility] pool. Temp of water was 91.  Pt entered/exited the pool via stairs independently with bilat rail.  - submerged 80%, unsupported: walking forward/ backwards  - L stretch; L stretch with tail wagging right for added Left sided LB stretch (in spasm) - 3 way hamstring stretch - side stepping with arm addct -> with arm addct with rainbow hand floats  - farmer  carry with rainbow hand floats at side, marching forward/ backward and side stepping for lower trap and core engagement  - staggered stance with bilat horiz abdct/ addct with rainbow hand floats x 5 each LE forward - plank position with hands on bench in water (yellow noodle under arms) x 10 LE hip ext each; fire hydrants x 5 ea -suspended vertical then sup using yellow noodle wrapped posteriorly across chest: cycling; hip add/abd; LAQ; hip flex/ext. Added nekdoodle for comfort.      PATIENT EDUCATION:  Education details: aquatic exercise rationale and modifications Person educated: Patient Education method: Explanation, Demonstration, and Handouts Education comprehension: verbalized understanding and returned demonstration  HOME EXERCISE PROGRAM: Access Code: [email or web address] URL: [email or web address] Date: 05/27/2023 Prepared by: Harrie Foreman  Exercises - Supine Hip Internal and External Rotation  - 1 x daily - 7 x weekly - 1 sets - 10 reps - Standing Hip Extension with Counter Support  - 1 x daily - 7 x weekly - 2-3 sets - 10 reps - modified to bent over on counter.  - Standing Lumbar Extension with Counter  - 1 x daily - 7 x weekly - 1 sets - 10 reps - Modified Thomas Stretch  - 1 x daily - 7 x weekly - 1 sets - 3 reps - 1 min hold  ASSESSMENT:  CLINICAL IMPRESSION: RAESEAN BARTOLETTI is making good progress towards goals.  He demonstrates improved RLE strength, especially R ankle strength.  He reports back pain has resolved, just having discomfort in L buttock now.  Modified Oswestry has improved from 22/50 to 11/50.  He has now met LTG # 2, 5 and 10.  He still demonstrates LE weakness, especially in hip extensors, impaired posture, decreased gait speed and gait abnormalities, all of which place him at higher risk of falls.  DGI score of 19/24 is also indicative of increased risk of falls in community dwelling adults.  [facility] would benefit from continued skilled  therapy to reach maximal benefit and safety.    OBJECTIVE IMPAIRMENTS: Abnormal gait, decreased activity tolerance, decreased balance, decreased endurance, decreased mobility, difficulty walking, decreased ROM, decreased strength, impaired perceived functional ability, increased muscle spasms, impaired flexibility, impaired sensation, postural dysfunction, and pain.   ACTIVITY LIMITATIONS: carrying, lifting, bending, standing, squatting, sleeping, stairs, transfers, bed mobility, and locomotion level  PARTICIPATION LIMITATIONS: meal prep, cleaning, laundry, driving, shopping, community activity, and occupation  PERSONAL FACTORS: Time since onset of injury/illness/exacerbation and 3+ comorbidities: T2DM, osteoarthritis, history DVT, chronic anticoagulation, prostate cancer, HTN, HLD,  COPD, OSA, GERD, hypothyroidism, chronic LBP, R THA 2006, L THA 2009, previous back surgery  are also affecting patient's functional outcome.   REHAB POTENTIAL: Good  CLINICAL DECISION MAKING: Evolving/moderate complexity  EVALUATION COMPLEXITY: Moderate   GOALS: Goals reviewed with patient? Yes  SHORT TERM GOALS: Target date: 06/06/2023   Patient will be independent with initial HEP.  Baseline:  Goal status: MET   LONG TERM GOALS: Target date: 07/18/2023   Patient will be independent with advanced/ongoing HEP to improve outcomes and carryover.  Baseline:  Goal status: IN PROGRESS 06/28/23- met for current  2.  Patient will report 75% improvement in low back pain to improve QOL.  Baseline: 5/10 Goal status: MET 06/28/23- back pain has resolved, just having pain in L buttock now.   3.  Patient will be able to stand erect without increased low back pain.    Baseline: stand with flexed posture maintaining ~ 20 deg hip flexion.  Goal status: IN PROGRESS 06/28/23- 15 deg hip flexion in standing   4.  Patient will demonstrate improved functional strength as demonstrated by 4+/5 RLE strength. Baseline: see  objective Goal status: IN PROGRESS 06/28/23- improving strength overall, see objective  5.  Patient will report at least 6 points improvement on modified Oswestry to demonstrate improved functional ability.  Baseline: 22/50 Goal status: MET 06/28/23 11/50   6.  Patient will tolerate 15 min of treadmill walking to start exercising at gym. Baseline: unable Goal status: IN PROGRESS 06/06/23 - 7 min on treadmill  7.   Patient will tolerate laying flat either supine or prone in order to return to massage therapy for pain relief.  Baseline: unable to lay in prone or supine Goal status: IN PROGRESS 06/06/23- can lay prone over 4 pillows 06/28/23- can lay flatter with HOB raised 15 deg and bolster under knees.   8. Patient will increase gait speed to 1.0 m/s to improve community access.  Baseline: 0.6 m/s Goal status: IN PROGRESS 06/28/23- typical speed still 0.6 m/s, fast speed 0.8 m/s  9.  Patient will be able to lift and swing 8-10 lbs without LBP in order to return to bowling.   Baseline: unable Goal status: IN PROGRESS  10.   Patient will score at least 19/24 on DGI to demonstrate lower risk of falls.  Baseline: NT Goal status: MET 06/28/23 19/24   PLAN:  PT FREQUENCY: 2x/week  PT DURATION: 8 weeks  PLANNED INTERVENTIONS: 97110-Therapeutic exercises, 97530- Therapeutic activity, 97112- Neuromuscular re-education, 97535- Self Care, 78469- Manual therapy, [phone]- Gait training, [phone]- Aquatic Therapy, 97014- Electrical stimulation (unattended), [phone]- Electrical stimulation (manual), Patient/Family education, Balance training, Stair training, Taping, Joint mobilization, Joint manipulation, Spinal manipulation, Spinal mobilization, Scar mobilization, Cryotherapy, and Moist heat.  PLAN FOR NEXT SESSION: continue core and extensor strengthening.  Aquatic therapy.    Jena Gauss, PT  06/28/23 12:16 PM

## 2023-06-30 ENCOUNTER — Encounter (HOSPITAL_BASED_OUTPATIENT_CLINIC_OR_DEPARTMENT_OTHER): Payer: Self-pay | Admitting: Physical Therapy

## 2023-06-30 ENCOUNTER — Encounter (HOSPITAL_BASED_OUTPATIENT_CLINIC_OR_DEPARTMENT_OTHER): Payer: Self-pay | Admitting: Urology

## 2023-06-30 ENCOUNTER — Emergency Department (HOSPITAL_BASED_OUTPATIENT_CLINIC_OR_DEPARTMENT_OTHER)
Admission: EM | Admit: 2023-06-30 | Discharge: 2023-06-30 | Disposition: A | Discharge status: Home / Self Care | Payer: No Typology Code available for payment source | Attending: Emergency Medicine | Admitting: Emergency Medicine

## 2023-06-30 ENCOUNTER — Emergency Department (HOSPITAL_BASED_OUTPATIENT_CLINIC_OR_DEPARTMENT_OTHER): Payer: No Typology Code available for payment source

## 2023-06-30 ENCOUNTER — Other Ambulatory Visit: Payer: Self-pay

## 2023-06-30 DIAGNOSIS — J441 Chronic obstructive pulmonary disease with (acute) exacerbation: Secondary | ICD-10-CM | POA: Diagnosis not present

## 2023-06-30 DIAGNOSIS — R1084 Generalized abdominal pain: Secondary | ICD-10-CM | POA: Diagnosis present

## 2023-06-30 DIAGNOSIS — Z8548 Personal history of malignant neoplasm of epididymis: Secondary | ICD-10-CM | POA: Insufficient documentation

## 2023-06-30 LAB — COMPREHENSIVE METABOLIC PANEL
ALT: 23 U/L (ref 0–44)
AST: 27 U/L (ref 15–41)
Albumin: 4.2 g/dL (ref 3.5–5.0)
Alkaline Phosphatase: 72 U/L (ref 38–126)
Anion gap: 10 (ref 5–15)
BUN: 22 mg/dL (ref 8–23)
CO2: 25 mmol/L (ref 22–32)
Calcium: 9.6 mg/dL (ref 8.9–10.3)
Chloride: 104 mmol/L (ref 98–111)
Creatinine, Ser: 1.43 mg/dL — ABNORMAL HIGH (ref 0.61–1.24)
GFR, Estimated: 54 mL/min — ABNORMAL LOW (ref >60)
Glucose, Bld: 108 mg/dL — ABNORMAL HIGH (ref 70–99)
Potassium: 4.5 mmol/L (ref 3.5–5.1)
Sodium: 139 mmol/L (ref 135–145)
Total Bilirubin: 1.9 mg/dL — ABNORMAL HIGH (ref 0.0–1.2)
Total Protein: 8.1 g/dL (ref 6.5–8.1)

## 2023-06-30 LAB — CBC WITH DIFFERENTIAL/PLATELET
Abs Immature Granulocytes: 0.01 10*3/uL (ref 0.00–0.07)
Basophils Absolute: 0 10*3/uL (ref 0.0–0.1)
Basophils Relative: 0 %
Eosinophils Absolute: 0.1 10*3/uL (ref 0.0–0.5)
Eosinophils Relative: 1 %
HCT: 45.1 % (ref 39.0–52.0)
Hemoglobin: 14.7 g/dL (ref 13.0–17.0)
Immature Granulocytes: 0 %
Lymphocytes Relative: 16 %
Lymphs Abs: 0.9 10*3/uL (ref 0.7–4.0)
MCH: 31.1 pg (ref 26.0–34.0)
MCHC: 32.6 g/dL (ref 30.0–36.0)
MCV: 95.3 fL (ref 80.0–100.0)
Monocytes Absolute: 0.4 10*3/uL (ref 0.1–1.0)
Monocytes Relative: 8 %
Neutro Abs: 4.1 10*3/uL (ref 1.7–7.7)
Neutrophils Relative %: 75 %
Platelets: 146 10*3/uL — ABNORMAL LOW (ref 150–400)
RBC: 4.73 MIL/uL (ref 4.22–5.81)
RDW: 14.4 % (ref 11.5–15.5)
WBC: 5.4 10*3/uL (ref 4.0–10.5)
nRBC: 0 % (ref 0.0–0.2)

## 2023-06-30 LAB — LIPASE, BLOOD: Lipase: 33 U/L (ref 11–51)

## 2023-06-30 MED ORDER — ONDANSETRON HCL 4 MG PO TABS: 4.0000 mg | ORAL_TABLET | Freq: Four times a day (QID) | ORAL | 0 refills | Status: DC

## 2023-06-30 MED ORDER — IOHEXOL 300 MG/ML  SOLN
100.0000 mL | Freq: Once | INTRAMUSCULAR | Status: AC | PRN
  Administered 2023-06-30: 100 mL via INTRAVENOUS

## 2023-06-30 MED ORDER — LACTATED RINGERS IV BOLUS
1000.0000 mL | Freq: Once | INTRAVENOUS | Status: AC
  Administered 2023-06-30: 1000 mL via INTRAVENOUS

## 2023-06-30 MED ORDER — ONDANSETRON HCL 4 MG/2ML IJ SOLN
4.0000 mg | Freq: Once | INTRAMUSCULAR | Status: AC
  Administered 2023-06-30: 4 mg via INTRAVENOUS
  Filled 2023-06-30: qty 2

## 2023-06-30 NOTE — ED Provider Notes (Signed)
[place] EMERGENCY DEPARTMENT AT [facility] Provider Note   CSN: [id number] Arrival date & time: 06/30/23  1433     History Chief Complaint  Patient presents with   Abdominal Pain   Illeostomy Concern     HPI Kyle Delacruz is a 68 y.o. male presenting for abdominal pain and increased ileostomy output. History of SVT, paroxysmal atrial fibrillation, PE, prostate cancer, COPD, OSA, obesity. States that he started having some cramping all throughout his abdomen and increased ostomy output over the last 48 hours.  Very poor p.o. intake today.  Patient's recorded medical, surgical, social, medication list and allergies were reviewed in the Snapshot window as part of the initial history.   Review of Systems   Review of Systems  Constitutional:  Negative for chills and fever.  HENT:  Negative for ear pain and sore throat.   Eyes:  Negative for pain and visual disturbance.  Respiratory:  Negative for cough and shortness of breath.   Cardiovascular:  Negative for chest pain and palpitations.  Gastrointestinal:  Positive for diarrhea. Negative for abdominal pain and vomiting.  Genitourinary:  Negative for dysuria and hematuria.  Musculoskeletal:  Negative for arthralgias and back pain.  Skin:  Negative for color change and rash.  Neurological:  Negative for seizures and syncope.  All other systems reviewed and are negative.   Physical Exam Updated Vital Signs BP 106/69 (BP Location: Left Arm)   Pulse 77   Temp 98.2 F (36.8 C) (Oral)   Resp 16   Ht 5\' 9"  (1.753 m)   Wt 95.3 kg   SpO2 100%   BMI 31.03 kg/m  Physical Exam Vitals and nursing note reviewed.  Constitutional:      General: He is not in acute distress.    Appearance: He is well-developed.  HENT:     Head: Normocephalic and atraumatic.  Eyes:     Conjunctiva/sclera: Conjunctivae normal.  Cardiovascular:     Rate and Rhythm: Normal rate and regular rhythm.     Heart sounds: No murmur  heard. Pulmonary:     Effort: Pulmonary effort is normal. No respiratory distress.     Breath sounds: Normal breath sounds.  Abdominal:     Palpations: Abdomen is soft.     Tenderness: There is abdominal tenderness. There is no guarding.  Musculoskeletal:        General: No swelling.     Cervical back: Neck supple.  Skin:    General: Skin is warm and dry.     Capillary Refill: Capillary refill takes less than 2 seconds.  Neurological:     Mental Status: He is alert.  Psychiatric:        Mood and Affect: Mood normal.      ED Course/ Medical Decision Making/ A&P    Procedures Procedures   Medical Decision Making:   [name] is a 68 y.o. male who presented to the ED today with abdominal pain, detailed above.    Patient placed on continuous vitals and telemetry monitoring while in ED which was reviewed periodically.  Complete initial physical exam performed, notably the patient  was HDS in NAD.     Reviewed and confirmed nursing documentation for past medical history, family history, social history.    Initial Assessment:   With the patient's presentation of abdominal pain, most likely diagnosis is nonspecific etiology. Other diagnoses were considered including (but not limited to) gastroenteritis, colitis, small bowel obstruction, appendicitis, cholecystitis, pancreatitis, nephrolithiasis, UTI, pyleonephritis.  These are considered less likely due to history of present illness and physical exam findings.   This is most consistent with an acute life/limb threatening illness complicated by underlying chronic conditions.   Initial Plan:  CBC/CMP to evaluate for underlying infectious/metabolic etiology for patient's abdominal pain  Lipase to evaluate for pancreatitis  EKG to evaluate for cardiac source of pain  CTAB/Pelvis with contrast to evaluate for structural/surgical etiology of patients' severe abdominal pain.  Urinalysis and repeat physical assessment to evaluate for  UTI/Pyelonpehritis  Empiric management of symptoms with escalating pain control and antiemetics as needed.   Initial Study Results:   Laboratory  All laboratory results reviewed without evidence of clinically relevant pathology.    Radiology All images reviewed independently. Agree with radiology report at this time.   CT ABDOMEN PELVIS W CONTRAST Result Date: 06/30/2023 CLINICAL DATA:  Acute abdominal pain EXAM: CT ABDOMEN AND PELVIS WITH CONTRAST TECHNIQUE: Multidetector CT imaging of the abdomen and pelvis was performed using the standard protocol following bolus administration of intravenous contrast. RADIATION DOSE REDUCTION: This exam was performed according to the departmental dose-optimization program which includes automated exposure control, adjustment of the mA and/or kV according to patient size and/or use of iterative reconstruction technique. CONTRAST:  OMNIPAQUE IOHEXOL 300 MG/ML  SOLN COMPARISON:  12/01/2022 FINDINGS: Lower chest: Stable scarring in the right lower lobe is noted. Hepatobiliary: No focal liver abnormality is seen. No gallstones, gallbladder wall thickening, or biliary dilatation. Pancreas: Unremarkable. No pancreatic ductal dilatation or surrounding inflammatory changes. Spleen: Normal in size without focal abnormality. Adrenals/Urinary Tract: Adrenal glands are within normal limits. Kidneys are well visualized bilaterally. Tiny nonobstructing stones are noted bilaterally similar to that seen on the prior exam. The ureters are within normal limits. The bladder is decompressed. Stomach/Bowel: Small Hartmann's pouch is noted in the pelvis. Remainder of the colon has been surgically removed. Right lower quadrant ileostomy is seen. No small bowel obstructive changes are noted. The stomach is within normal limits. Vascular/Lymphatic: Aortic atherosclerosis. No enlarged abdominal or pelvic lymph nodes. Reproductive: Prostate is unremarkable. Other: No abdominal wall hernia  or abnormality. No abdominopelvic ascites. Musculoskeletal: Bilateral hip replacements are seen. No acute bony abnormality is noted. Postsurgical changes in the lumbar spine are noted as well. IMPRESSION: Tiny nonobstructing stones bilaterally. Changes of prior subtotal colectomy with right lower quadrant ostomy. No acute abnormality is noted. Electronically Signed   By: Alcide Clever M.D.   On: 06/30/2023 20:15   Final Reassessment and Plan:   On reassessment he is in no acute distress.  CT scan showed no focal pathology. He is not ambulatory tolerating.  Taken requesting discharge due to resolution of syndrome.  Likely viral etiology. Disposition:  I have considered need for hospitalization, however, considering all of the above, I believe this patient is stable for discharge at this time.  Patient/family educated about specific return precautions for given chief complaint and symptoms.  Patient/family educated about follow-up with PCP.     Patient/family expressed understanding of return precautions and need for follow-up. Patient spoken to regarding all imaging and laboratory results and appropriate follow up for these results. All education provided in verbal form with additional information in written form. Time was allowed for answering of patient questions. Patient discharged.    Emergency Department Medication Summary:   Medications  lactated ringers bolus 1,000 mL (0 mLs Intravenous Stopped 06/30/23 2049)  ondansetron (ZOFRAN) injection 4 mg (4 mg Intravenous Given 06/30/23 1818)  iohexol (OMNIPAQUE) 300 MG/ML  solution 100 mL (100 mLs Intravenous Contrast Given 06/30/23 1944)       Medications Ordered in ED   Medications  lactated ringers bolus 1,000 mL (0 mLs Intravenous Stopped 06/30/23 2049)  ondansetron (ZOFRAN) injection 4 mg (4 mg Intravenous Given 06/30/23 1818)  iohexol (OMNIPAQUE) 300 MG/ML solution 100 mL (100 mLs Intravenous Contrast Given 06/30/23 1944)   Clinical  Impression:  1. Generalized abdominal pain      Discharge   Final Clinical Impression(s) / ED Diagnoses Final diagnoses:  Generalized abdominal pain    Rx / DC Orders ED Discharge Orders          Ordered    ondansetron (ZOFRAN) 4 MG tablet  Every 6 hours        06/30/23 2103              Glyn Ade, MD 06/30/23 2103

## 2023-06-30 NOTE — ED Triage Notes (Signed)
Pt states pain at illeostomy x 2 days  States liquid green stool noted, nothing solid   Had FLU 2 weeks ago

## 2023-07-01 ENCOUNTER — Ambulatory Visit (HOSPITAL_BASED_OUTPATIENT_CLINIC_OR_DEPARTMENT_OTHER): Payer: Federal, State, Local not specified - PPO | Admitting: Physical Therapy

## 2023-07-01 ENCOUNTER — Encounter (HOSPITAL_BASED_OUTPATIENT_CLINIC_OR_DEPARTMENT_OTHER): Payer: Self-pay

## 2023-07-04 ENCOUNTER — Ambulatory Visit (HOSPITAL_BASED_OUTPATIENT_CLINIC_OR_DEPARTMENT_OTHER): Payer: Federal, State, Local not specified - PPO | Admitting: Physical Therapy

## 2023-07-08 ENCOUNTER — Encounter (HOSPITAL_BASED_OUTPATIENT_CLINIC_OR_DEPARTMENT_OTHER): Payer: Self-pay | Admitting: Physical Therapy

## 2023-07-08 ENCOUNTER — Ambulatory Visit (HOSPITAL_BASED_OUTPATIENT_CLINIC_OR_DEPARTMENT_OTHER): Payer: Federal, State, Local not specified - PPO | Admitting: Physical Therapy

## 2023-07-08 DIAGNOSIS — R293 Abnormal posture: Secondary | ICD-10-CM

## 2023-07-08 DIAGNOSIS — M5459 Other low back pain: Secondary | ICD-10-CM | POA: Diagnosis not present

## 2023-07-08 DIAGNOSIS — R262 Difficulty in walking, not elsewhere classified: Secondary | ICD-10-CM

## 2023-07-08 DIAGNOSIS — M6281 Muscle weakness (generalized): Secondary | ICD-10-CM

## 2023-07-08 NOTE — Therapy (Signed)
 OUTPATIENT PHYSICAL THERAPY THORACOLUMBAR TREATMENT    Patient Name: Kyle Delacruz MRN: [id number] DOB:December 22, 1955, 68 y.o., male Today's Date: 07/08/2023  END OF SESSION:  PT End of Session - 07/08/23 1148     Visit Number 10    Date for PT Re-Evaluation 07/18/23    Authorization Type [employer] MCR + BCBS  BCBS VL 50)    Authorization Time Period new auth needed?    Progress Note Due on Visit 19    PT Start Time 1149    PT Stop Time 1230    PT Time Calculation (min) 41 min    Activity Tolerance Patient tolerated treatment well    Behavior During Therapy WFL for tasks assessed/performed               Past Medical History:  Diagnosis Date   Allergy    Arthritis    osteo   CTS (carpal tunnel syndrome)    DJD (degenerative joint disease)    DM2 (diabetes mellitus, type 2) (HCC)    DVT (deep venous thrombosis) (HCC)    GERD (gastroesophageal reflux disease)    Gout    History of adenomatous polyps of colon 12/29/2019   History of nuclear stress test    Myoview 11/17: EF 54, no ST changes, hypertensive blood pressure response, no ischemia, low risk   Hyperlipidemia    Hypothyroidism    Nicotine addiction    OSA (obstructive sleep apnea)    Prostatitis    Reactive airway disease    Sarcocystosis    Sinus arrhythmia    Sleep apnea    cpap- not wearing   Tinea corporis    Past Surgical History:  Procedure Laterality Date   BILATERAL KNEE ARTHROSCOPY     right- 1997; left 2001   COLONOSCOPY  multiple last 2013   IVC FILTER INSERTION N/A 09/17/2022   Procedure: IVC FILTER INSERTION;  Surgeon: Maeola Harman, MD;  Location: [facility] CV LAB;  Service: Cardiovascular;  Laterality: N/A;   IVC FILTER REMOVAL N/A 01/28/2023   Procedure: IVC FILTER REMOVAL;  Surgeon: Maeola Harman, MD;  Location: [facility] INVASIVE CV LAB;  Service: Cardiovascular;  Laterality: N/A;   KNEE SURGERY Right 2014   torn R medial meniscus- arthroscopy done    LAPAROTOMY N/A 04/10/2022   Procedure: EXPLORATORY LAPAROTOMY, TOTAL COLECTOMY, END ILEOSTOMY;  Surgeon: Quentin Ore, MD;  Location: [facility];  Service: General;  Laterality: N/A;   PARATHYROIDECTOMY  2005   ROTATOR CUFF REPAIR  2004   right   TONSILLECTOMY     TOTAL HIP ARTHROPLASTY Left 2009   TOTAL HIP ARTHROPLASTY Right 2006   UPPER GASTROINTESTINAL ENDOSCOPY     Patient Active Problem List   Diagnosis Date Noted   History of pulmonary embolism 06/28/2022   History of DVT (deep vein thrombosis) 06/28/2022   Lumbar radiculopathy, chronic 06/28/2022   Nausea and vomiting 06/28/2022   Septic shock (HCC) 06/26/2022   SVT (supraventricular tachycardia) (HCC) 06/19/2022   PAF (paroxysmal atrial fibrillation) (HCC) 06/18/2022   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   High output ileostomy (HCC) 04/21/2022   Chronic anticoagulation 04/21/2022   Normocytic anemia 04/10/2022   Shock (HCC) 04/10/2022   Deep vein thrombosis (DVT) of femoral vein of right lower extremity (HCC) 04/09/2022   Leg DVT (deep venous thromboembolism), chronic, right (HCC) 04/08/2022   Abdominal distension 04/08/2022   Acute respiratory failure with hypoxia (HCC) 07/27/2021   PE (pulmonary thromboembolism) (HCC) 07/26/2021   Carcinoma of prostate (  HCC) 07/10/2021   Chronic obstructive pulmonary disease (HCC) 07/10/2021   HLD (hyperlipidemia) 03/10/2021   Hypothyroidism 03/10/2021   Chronic bilateral back pain 03/10/2021   History of adenomatous polyps of colon 12/29/2019   Essential hypertension 06/13/2011   Obstructive sleep apnea (adult) (pediatric) 06/13/2011   Gastro-esophageal reflux disease without esophagitis 05/02/2009    PCP: Chilton Greathouse, MD   REFERRING PROVIDER: Rosendo Gros, PA*   REFERRING DIAG: [phone] (ICD-10-CM) - Fusion of spine of lumbar region   Rationale for Evaluation and Treatment: Rehabilitation  THERAPY DIAG:  Other low back pain  Muscle weakness  (generalized)  Abnormal posture  Difficulty in walking, not elsewhere classified  ONSET DATE: Back surgery 04/04/22, PE 04/08/22, 06/26/22 Sepsis, 11/05/22 Second back surgery to remove hardware and remove IVC filter  SUBJECTIVE:                                                                                                                                                                                           SUBJECTIVE STATEMENT: "Had a virus over weekend had to go to emergent care but I am feeling better now.. "   POOL ACCESS:  Pt has membership to [facility] with silver sneakers. Pt reports he hasn't been to pool since fall 2024.   FROM VO:ZDGU said I need to strengthen my back muscles cause I can't stand up and walk like I want to, I know I can take a fall now, I was sitting around a fire circle and flipped back in a chair, got an x-ray and everything was good, didn't loosen the hardware, bone is healing though everything.  My neck has been a little sore from that too. Has had a lot of surgeries starting with original back surgery on 04/04/22, developed PE on 04/08/22, then developed sepsis on 06/26/22, had increased back pain starting in March 2024, cage in back came loose, had hardware removed and IVC on 11/05/22.  Can't lay flat because sleeping in recliner which also makes neck sore.  Would like to get back to swimming too.   PERTINENT HISTORY:  T2DM, osteoarthritis, history DVT, chronic anticoagulation, prostate cancer, HTN, HLD, COPD, OSA, GERD, hypothyroidism, chronic LBP, R THA 2006, L THA 2009, previous back surgery  PAIN:  Are you having pain? Yes: NPRS scale: 3/10 Pain location: L "butt bone" pointing to L hip   Pain description: ache Aggravating factors: standing and walking > 30 min  Relieving factors: sitting in recliner  PRECAUTIONS: Fall and Other: has ostomy  RED FLAGS: None   WEIGHT BEARING RESTRICTIONS: No  FALLS:  Has patient fallen in last 6 months? Yes. Number  of falls 1 - fell backwards in chair and tripped on R foot, weakness in R ankle DF  LIVING ENVIRONMENT: Lives with: lives with their spouse Lives in: House/apartment Stairs: Yes: External: 6 steps; on left going up 2nd floor bonus room but does not go up those stairs, living area on 1st floor, 6 steps to garage Has following equipment at home: Tour manager, Grab bars, and scooter  OCCUPATION: retired from post office  PLOF: Independent and Leisure: swimming  PATIENT GOALS: return to swimming, be able to walk without pain, walk on treadmill, and bowl again  NEXT MD VISIT: in 5 months  OBJECTIVE:   DIAGNOSTIC FINDINGS:  11/06/22 XR Lumbar spine IMPRESSION:  1. L2-L5 posterior lumbar fusion with instrumentation and anterior  lumbar discectomy. Stable alignment.   PATIENT SURVEYS:  Modified Oswestry 22/50   COGNITION: Overall cognitive status: Within functional limits for tasks assessed     SENSATION: WFL  MUSCLE LENGTH: Hamstrings: significant tightness bilaterally  POSTURE: decreased lumbar lordosis, maintains 20 deg forward hip flexion  PALPATION: Diffuse tenderness low back   LUMBAR ROM:  lumbar AROM limited by fusion, no pain with flexion, unable to extend to neutral due to pain.   LOWER EXTREMITY ROM:   significant tightness bil hips R >L for both IR and ER.    LOWER EXTREMITY MMT:    MMT Right* eval Left* eval Right* 06/28/23  Hip flexion 4 5 4+  Hip extension 3 3   Hip abduction 4 5 4   Hip adduction 4+ 5 5  Knee flexion 5 5 5   Knee extension 5 5 5   Ankle dorsiflexion 3 5 3+  Ankle plantarflexion 4 5 4+   (Blank rows = not tested) * tested in sitting  LUMBAR SPECIAL TESTS:  NA   FUNCTIONAL TESTS:  5 times sit to stand: 15.6 sec    - 240'   Gait speed 0.6 m/s GAIT: Distance walked: 240' Assistive device utilized: None Level of assistance: Complete Independence Comments: wide BOS, forward flexed posture, visually slow     TODAY'S  TREATMENT:                                                                                                                              DATE:   07/08/2023 Pt seen for aquatic therapy today.  Treatment took place in water 3.5-4.75 ft in depth at the [facility] pool. Temp of water was 91.  Pt entered/exited the pool via stairs independently with bilat rail.  - submerged 70, unsupported: walking forward/ backwards  - L stretch - farmer carry with rainbow hand floats-> yellow HB at side forward, back and side stepping. Cues for core engagement - side stepping with arm addct -> with arm addct with rainbow hand floats  - plank position with hands on bench in water (yellow noodle under arms) x 10 LE hip ext each; fire hydrants and mountain climbers x 5 ea -hip hiking bottom  step 2 x 8.  Some discomfort left buttock with descending of left hip -  yellow HB push downs - staggered stance with bilat horiz abdct/ addct with rainbow hand floats x 5 each LE forward - bow & arrow x 10 - hip flex and quad stretch on 2nd step (good stretch)  Pt requires the buoyancy and hydrostatic pressure of water for support, and to offload joints by unweighting joint load by at least 50 % in navel deep water and by at least 75-80% in chest to neck deep water.  Viscosity of the water is needed for resistance of strengthening. Water current perturbations provides challenge to standing balance requiring increased core activation.   06/28/23 Tests & Measures DGI MMT Modified Oswestry Gait Speed Therapeutic Exercise: to improve strength and mobility.  Demo, verbal and tactile cues throughout for technique. Treadmill Incline 2 speed 0.9 x 6 min for warm-up & subjective Self Care: Discussion of sleep positioning while positioned in supine with HOB elevated 15 deg and bolster under knees, importance of prioritizing sleep in bed instead of recliner for hip flexor stretching and cervical positioning.      PATIENT  EDUCATION:  Education details: aquatic exercise rationale and modifications Person educated: Patient Education method: Explanation, Demonstration, and Handouts Education comprehension: verbalized understanding and returned demonstration  HOME EXERCISE PROGRAM: Access Code: [email or web address] URL: [email or web address] Date: 05/27/2023 Prepared by: Kyle [facility]  Exercises - Supine Hip Internal and External Rotation  - 1 x daily - 7 x weekly - 1 sets - 10 reps - Standing Hip Extension with Counter Support  - 1 x daily - 7 x weekly - 2-3 sets - 10 reps - modified to bent over on counter.  - Standing Lumbar Extension with Counter  - 1 x daily - 7 x weekly - 1 sets - 10 reps - Modified Thomas Stretch  - 1 x daily - 7 x weekly - 1 sets - 3 reps - 1 min hold  ASSESSMENT:  CLINICAL IMPRESSION: Focused on post core/hip strength as well as hip flexors stretching. Good toleration.  He does report some left glut discomfort with right hip hiking but does not flare.  Hip flex stretch is reportedly "good" at steps and it does not increase LBP. Doing well.  Goals ongoing  PN: Kyle [facility] is making good progress towards goals.  He demonstrates improved RLE strength, especially R ankle strength.  He reports back pain has resolved, just having discomfort in L buttock now.  Modified Oswestry has improved from 22/50 to 11/50.  He has now met LTG # 2, 5 and 10.  He still demonstrates LE weakness, especially in hip extensors, impaired posture, decreased gait speed and gait abnormalities, all of which place him at higher risk of falls.  DGI score of 19/24 is also indicative of increased risk of falls in community dwelling adults.  Kyle [facility]acruz would benefit from continued skilled therapy to reach maximal benefit and safety.    OBJECTIVE IMPAIRMENTS: Abnormal gait, decreased activity tolerance, decreased balance, decreased endurance, decreased mobility, difficulty walking, decreased ROM, decreased  strength, impaired perceived functional ability, increased muscle spasms, impaired flexibility, impaired sensation, postural dysfunction, and pain.   ACTIVITY LIMITATIONS: carrying, lifting, bending, standing, squatting, sleeping, stairs, transfers, bed mobility, and locomotion level  PARTICIPATION LIMITATIONS: meal prep, cleaning, laundry, driving, shopping, community activity, and occupation  PERSONAL FACTORS: Time since onset of injury/illness/exacerbation and 3+ comorbidities: T2DM, osteoarthritis, history DVT, chronic anticoagulation, prostate cancer, HTN, HLD, COPD, OSA, GERD, hypothyroidism,  chronic LBP, R THA 2006, L THA 2009, previous back surgery  are also affecting patient's functional outcome.   REHAB POTENTIAL: Good  CLINICAL DECISION MAKING: Evolving/moderate complexity  EVALUATION COMPLEXITY: Moderate   GOALS: Goals reviewed with patient? Yes  SHORT TERM GOALS: Target date: 06/06/2023   Patient will be independent with initial HEP.  Baseline:  Goal status: MET   LONG TERM GOALS: Target date: 07/18/2023   Patient will be independent with advanced/ongoing HEP to improve outcomes and carryover.  Baseline:  Goal status: IN PROGRESS 06/28/23- met for current  2.  Patient will report 75% improvement in low back pain to improve QOL.  Baseline: 5/10 Goal status: MET 06/28/23- back pain has resolved, just having pain in L buttock now.   3.  Patient will be able to stand erect without increased low back pain.    Baseline: stand with flexed posture maintaining ~ 20 deg hip flexion.  Goal status: IN PROGRESS 06/28/23- 15 deg hip flexion in standing   4.  Patient will demonstrate improved functional strength as demonstrated by 4+/5 RLE strength. Baseline: see objective Goal status: IN PROGRESS 06/28/23- improving strength overall, see objective  5.  Patient will report at least 6 points improvement on modified Oswestry to demonstrate improved functional ability.  Baseline:  22/50 Goal status: MET 06/28/23 11/50   6.  Patient will tolerate 15 min of treadmill walking to start exercising at gym. Baseline: unable Goal status: IN PROGRESS 06/06/23 - 7 min on treadmill  7.   Patient will tolerate laying flat either supine or prone in order to return to massage therapy for pain relief.  Baseline: unable to lay in prone or supine Goal status: IN PROGRESS 06/06/23- can lay prone over 4 pillows 06/28/23- can lay flatter with HOB raised 15 deg and bolster under knees.   8. Patient will increase gait speed to 1.0 m/s to improve community access.  Baseline: 0.6 m/s Goal status: IN PROGRESS 06/28/23- typical speed still 0.6 m/s, fast speed 0.8 m/s  9.  Patient will be able to lift and swing 8-10 lbs without LBP in order to return to bowling.   Baseline: unable Goal status: IN PROGRESS  10.   Patient will score at least 19/24 on DGI to demonstrate lower risk of falls.  Baseline: NT Goal status: MET 06/28/23 19/24   PLAN:  PT FREQUENCY: 2x/week  PT DURATION: 8 weeks  PLANNED INTERVENTIONS: 97110-Therapeutic exercises, 97530- Therapeutic activity, 97112- Neuromuscular re-education, 97535- Self Care, 21308- Manual therapy, [phone]- Gait training, [phone]- Aquatic Therapy, [id number]- Electrical stimulation (unattended), [phone]- Electrical stimulation (manual), Patient/Family education, Balance training, Stair training, Taping, Joint mobilization, Joint manipulation, Spinal manipulation, Spinal mobilization, Scar mobilization, Cryotherapy, and Moist heat.  PLAN FOR NEXT SESSION: continue core and extensor strengthening.  Aquatic therapy.    Kyle [facility] MPT 07/08/23 12:18 PM [facility] [address] [place], [place], [address] Phone: [phone]   Fax:  [phone]

## 2023-07-09 ENCOUNTER — Encounter: Payer: Self-pay | Admitting: Pulmonary Disease

## 2023-07-11 ENCOUNTER — Ambulatory Visit: Payer: Federal, State, Local not specified - PPO | Admitting: Physical Therapy

## 2023-07-11 ENCOUNTER — Encounter: Payer: Self-pay | Admitting: Physical Therapy

## 2023-07-11 DIAGNOSIS — M5459 Other low back pain: Secondary | ICD-10-CM

## 2023-07-11 DIAGNOSIS — R262 Difficulty in walking, not elsewhere classified: Secondary | ICD-10-CM

## 2023-07-11 DIAGNOSIS — R293 Abnormal posture: Secondary | ICD-10-CM

## 2023-07-11 DIAGNOSIS — M6281 Muscle weakness (generalized): Secondary | ICD-10-CM

## 2023-07-11 NOTE — Therapy (Signed)
 OUTPATIENT PHYSICAL THERAPY THORACOLUMBAR TREATMENT    Patient Name: Kyle Delacruz MRN: [id number] DOB:Jan 20, 1956, 68 y.o., male Today's Date: 07/11/2023  END OF SESSION:  PT End of Session - 07/11/23 1314     Visit Number 11    Date for PT Re-Evaluation 07/18/23    Authorization Type [employer] MCR + [employer]BS  BCBS VL 50)    Authorization Time Period --    Progress Note Due on Visit 19    PT Start Time 1315    PT Stop Time 1400    PT Time Calculation (min) 45 min    Activity Tolerance Patient tolerated treatment well    Behavior During Therapy WFL for tasks assessed/performed               Past Medical History:  Diagnosis Date   Allergy    Arthritis    osteo   CTS (carpal tunnel syndrome)    DJD (degenerative joint disease)    DM2 (diabetes mellitus, type 2) (HCC)    DVT (deep venous thrombosis) (HCC)    GERD (gastroesophageal reflux disease)    Gout    History of adenomatous polyps of colon 12/29/2019   History of nuclear stress test    Myoview 11/17: EF 54, no ST changes, hypertensive blood pressure response, no ischemia, low risk   Hyperlipidemia    Hypothyroidism    Nicotine addiction    OSA (obstructive sleep apnea)    Prostatitis    Reactive airway disease    Sarcocystosis    Sinus arrhythmia    Sleep apnea    cpap- not wearing   Tinea corporis    Past Surgical History:  Procedure Laterality Date   BILATERAL KNEE ARTHROSCOPY     right- 1997; left 2001   COLONOSCOPY  multiple last 2013   IVC FILTER INSERTION N/A 09/17/2022   Procedure: IVC FILTER INSERTION;  Surgeon: Maeola Harman, MD;  Location: [facility] INVASIVE CV LAB;  Service: Cardiovascular;  Laterality: N/A;   IVC FILTER REMOVAL N/A 01/28/2023   Procedure: IVC FILTER REMOVAL;  Surgeon: Maeola Harman, MD;  Location: [facility] INVASIVE CV LAB;  Service: Cardiovascular;  Laterality: N/A;   KNEE SURGERY Right 2014   torn R medial meniscus- arthroscopy done   LAPAROTOMY N/A  04/10/2022   Procedure: EXPLORATORY LAPAROTOMY, TOTAL COLECTOMY, END ILEOSTOMY;  Surgeon: Quentin Ore, MD;  Location: [facility];  Service: General;  Laterality: N/A;   PARATHYROIDECTOMY  2005   ROTATOR CUFF REPAIR  2004   right   TONSILLECTOMY     TOTAL HIP ARTHROPLASTY Left 2009   TOTAL HIP ARTHROPLASTY Right 2006   UPPER GASTROINTESTINAL ENDOSCOPY     Patient Active Problem List   Diagnosis Date Noted   History of pulmonary embolism 06/28/2022   History of DVT (deep vein thrombosis) 06/28/2022   Lumbar radiculopathy, chronic 06/28/2022   Nausea and vomiting 06/28/2022   Septic shock (HCC) 06/26/2022   SVT (supraventricular tachycardia) (HCC) 06/19/2022   PAF (paroxysmal atrial fibrillation) (HCC) 06/18/2022   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   High output ileostomy (HCC) 04/21/2022   Chronic anticoagulation 04/21/2022   Normocytic anemia 04/10/2022   Shock (HCC) 04/10/2022   Deep vein thrombosis (DVT) of femoral vein of right lower extremity (HCC) 04/09/2022   Leg DVT (deep venous thromboembolism), chronic, right (HCC) 04/08/2022   Abdominal distension 04/08/2022   Acute respiratory failure with hypoxia (HCC) 07/27/2021   PE (pulmonary thromboembolism) (HCC) 07/26/2021   Carcinoma of prostate (HCC) 07/10/2021  Chronic obstructive pulmonary disease (HCC) 07/10/2021   HLD (hyperlipidemia) 03/10/2021   Hypothyroidism 03/10/2021   Chronic bilateral back pain 03/10/2021   History of adenomatous polyps of colon 12/29/2019   Essential hypertension 06/13/2011   Obstructive sleep apnea (adult) (pediatric) 06/13/2011   Gastro-esophageal reflux disease without esophagitis 05/02/2009    PCP: Chilton Greathouse, MD   REFERRING PROVIDER: Rosendo Gros, PA*   REFERRING DIAG: [phone] (ICD-10-CM) - Fusion of spine of lumbar region   Rationale for Evaluation and Treatment: Rehabilitation  THERAPY DIAG:  Other low back pain  Muscle weakness (generalized)  Abnormal  posture  Difficulty in walking, not elsewhere classified  ONSET DATE: Back surgery 04/04/22, PE 04/08/22, 06/26/22 Sepsis, 11/05/22 Second back surgery to remove hardware and remove IVC filter  SUBJECTIVE:                                                                                                                                                                                           SUBJECTIVE STATEMENT: 07/11/2023 Still has pain in "buttbone"  slept in bed last night for 6 hours, first time.     POOL ACCESS:  Pt has membership to [facility] with silver sneakers. Pt reports he hasn't been to pool since fall 2024.   FROM [facility]:YNWG said I need to strengthen my back muscles cause I can't stand up and walk like I want to, I know I can take a fall now, I was sitting around a fire circle and flipped back in a chair, got an x-ray and everything was good, didn't loosen the hardware, bone is healing though everything.  My neck has been a little sore from that too. Has had a lot of surgeries starting with original back surgery on 04/04/22, developed PE on 04/08/22, then developed sepsis on 06/26/22, had increased back pain starting in March 2024, cage in back came loose, had hardware removed and IVC on 11/05/22.  Can't lay flat because sleeping in recliner which also makes neck sore.  Would like to get back to swimming too.   PERTINENT HISTORY:  T2DM, osteoarthritis, history DVT, chronic anticoagulation, prostate cancer, HTN, HLD, COPD, OSA, GERD, hypothyroidism, chronic LBP, R THA 2006, L THA 2009, previous back surgery  PAIN:  Are you having pain? Yes: NPRS scale: 3/10 Pain location: L "butt bone" pointing to L hip   Pain description: ache Aggravating factors: standing and walking > 30 min  Relieving factors: sitting in recliner  PRECAUTIONS: Fall and Other: has ostomy  RED FLAGS: None   WEIGHT BEARING RESTRICTIONS: No  FALLS:  Has patient fallen in last 6 months? Yes. Number of falls 1 -  fell  backwards in chair and tripped on R foot, weakness in R ankle DF  LIVING ENVIRONMENT: Lives with: lives with their spouse Lives in: House/apartment Stairs: Yes: External: 6 steps; on left going up 2nd floor bonus room but does not go up those stairs, living area on 1st floor, 6 steps to garage Has following equipment at home: Tour manager, Grab bars, and scooter  OCCUPATION: retired from post office  PLOF: Independent and Leisure: swimming  PATIENT GOALS: return to swimming, be able to walk without pain, walk on treadmill, and bowl again  NEXT MD VISIT: in 5 months  OBJECTIVE:   DIAGNOSTIC FINDINGS:  11/06/22 XR Lumbar spine IMPRESSION:  1. L2-L5 posterior lumbar fusion with instrumentation and anterior  lumbar discectomy. Stable alignment.   PATIENT SURVEYS:  Modified Oswestry 22/50   COGNITION: Overall cognitive status: Within functional limits for tasks assessed     SENSATION: WFL  MUSCLE LENGTH: Hamstrings: significant tightness bilaterally  POSTURE: decreased lumbar lordosis, maintains 20 deg forward hip flexion  PALPATION: Diffuse tenderness low back   LUMBAR ROM:  lumbar AROM limited by fusion, no pain with flexion, unable to extend to neutral due to pain.   LOWER EXTREMITY ROM:   significant tightness bil hips R >L for both IR and ER.    LOWER EXTREMITY MMT:    MMT Right* eval Left* eval Right* 06/28/23  Hip flexion 4 5 4+  Hip extension 3 3   Hip abduction 4 5 4   Hip adduction 4+ 5 5  Knee flexion 5 5 5   Knee extension 5 5 5   Ankle dorsiflexion 3 5 3+  Ankle plantarflexion 4 5 4+   (Blank rows = not tested) * tested in sitting  LUMBAR SPECIAL TESTS:  NA   FUNCTIONAL TESTS:  5 times sit to stand: 15.6 sec    - 240'   Gait speed 0.6 m/s GAIT: Distance walked: 240' Assistive device utilized: None Level of assistance: Complete Independence Comments: wide BOS, forward flexed posture, visually slow     TODAY'S TREATMENT:                                                                                                                               DATE:   07/11/23  Therapeutic Exercise: to improve strength and mobility.  Demo, verbal and tactile cues throughout for technique. Treadmill Incline 2, 1.0 mph x 6 min  Hip extensions 2 x 15 bil - bent over with arms stabilized on physioball to engage core, SBA for safety Leg Press 25# x 20, single leg press 25# x 10 R/L, Ankle press 25# x 15 bil  Hamstring curls 20# x 20 Leg extensions 20# x 20  Lat pull down 25# x 10 standing (SBA needed for safety, unsteady) x 10 seated  Manual Therapy:  IASTM with Percussive device to L glute to decrease pain/spasm     07/08/2023 Pt seen for aquatic therapy today.  Treatment took place in water 3.5-4.75 ft in depth at the [facility] pool. Temp of water was 91.  Pt entered/exited the pool via stairs independently with bilat rail.  - submerged 70, unsupported: walking forward/ backwards  - L stretch - farmer Kyle with rainbow hand floats-> yellow HB at side forward, back and side stepping. Cues for core engagement - side stepping with arm addct -> with arm addct with rainbow hand floats  - plank position with hands on bench in water (yellow noodle under arms) x 10 LE hip ext each; fire hydrants and mountain climbers x 5 ea -hip hiking bottom step 2 x 8.  Some discomfort left buttock with descending of left hip -  yellow HB push downs - staggered stance with bilat horiz abdct/ addct with rainbow hand floats x 5 each LE forward - bow & arrow x 10 - hip flex and quad stretch on 2nd step (good stretch)  Pt requires the buoyancy and hydrostatic pressure of water for support, and to offload joints by unweighting joint load by at least 50 % in navel deep water and by at least 75-80% in chest to neck deep water.  Viscosity of the water is needed for resistance of strengthening. Water current perturbations provides challenge to standing  balance requiring increased core activation.   06/28/23 Tests & Measures DGI MMT Modified Oswestry Gait Speed Therapeutic Exercise: to improve strength and mobility.  Demo, verbal and tactile cues throughout for technique. Treadmill Incline 2 speed 0.9 x 6 min for warm-up & subjective Self Care: Discussion of sleep positioning while positioned in supine with HOB elevated 15 deg and bolster under knees, importance of prioritizing sleep in bed instead of recliner for hip flexor stretching and cervical positioning.      PATIENT EDUCATION:  Education details: aquatic exercise rationale and modifications Person educated: Patient Education method: Explanation, Demonstration, and Handouts Education comprehension: verbalized understanding and returned demonstration  HOME EXERCISE PROGRAM: Access Code: [email or web address] URL: [email or web address] Date: 05/27/2023 Prepared by: Harrie Foreman  Exercises - Supine Hip Internal and External Rotation  - 1 x daily - 7 x weekly - 1 sets - 10 reps - Standing Hip Extension with Counter Support  - 1 x daily - 7 x weekly - 2-3 sets - 10 reps - modified to bent over on counter.  - Standing Lumbar Extension with Counter  - 1 x daily - 7 x weekly - 1 sets - 10 reps - Modified Thomas Stretch  - 1 x daily - 7 x weekly - 1 sets - 3 reps - 1 min hold  ASSESSMENT:  CLINICAL IMPRESSION: Kyle Delacruz continues to report L glut pain.  Today focused on LE strengthening, with gym weight equipment to encourage return to Y.  He was able to perform exercises although had instability in standing with lat pull downs, able to tolerate sitting position though.  Reported decreased pain after using massage gun on L glute, he thinks he has one at home, encouraged to try this as well.  Kyle Delacruz continues to demonstrate potential for improvement and would benefit from continued skilled therapy to address impairments.     OBJECTIVE IMPAIRMENTS: Abnormal  gait, decreased activity tolerance, decreased balance, decreased endurance, decreased mobility, difficulty walking, decreased ROM, decreased strength, impaired perceived functional ability, increased muscle spasms, impaired flexibility, impaired sensation, postural dysfunction, and pain.   ACTIVITY LIMITATIONS: carrying, lifting, bending, standing, squatting, sleeping, stairs, transfers, bed mobility, and locomotion level  PARTICIPATION LIMITATIONS: meal prep,  cleaning, laundry, driving, shopping, community activity, and occupation  PERSONAL FACTORS: Time since onset of injury/illness/exacerbation and 3+ comorbidities: T2DM, osteoarthritis, history DVT, chronic anticoagulation, prostate cancer, HTN, HLD, COPD, OSA, GERD, hypothyroidism, chronic LBP, R THA 2006, L THA 2009, previous back surgery  are also affecting patient's functional outcome.   REHAB POTENTIAL: Good  CLINICAL DECISION MAKING: Evolving/moderate complexity  EVALUATION COMPLEXITY: Moderate   GOALS: Goals reviewed with patient? Yes  SHORT TERM GOALS: Target date: 06/06/2023   Patient will be independent with initial HEP.  Baseline:  Goal status: MET   LONG TERM GOALS: Target date: 07/18/2023   Patient will be independent with advanced/ongoing HEP to improve outcomes and carryover.  Baseline:  Goal status: IN PROGRESS 06/28/23- met for current  2.  Patient will report 75% improvement in low back pain to improve QOL.  Baseline: 5/10 Goal status: MET 06/28/23- back pain has resolved, just having pain in L buttock now.   3.  Patient will be able to stand erect without increased low back pain.    Baseline: stand with flexed posture maintaining ~ 20 deg hip flexion.  Goal status: IN PROGRESS 06/28/23- 15 deg hip flexion in standing   4.  Patient will demonstrate improved functional strength as demonstrated by 4+/5 RLE strength. Baseline: see objective Goal status: IN PROGRESS 06/28/23- improving strength overall, see  objective  5.  Patient will report at least 6 points improvement on modified Oswestry to demonstrate improved functional ability.  Baseline: 22/50 Goal status: MET 06/28/23 11/50   6.  Patient will tolerate 15 min of treadmill walking to start exercising at gym. Baseline: unable Goal status: IN PROGRESS 06/06/23 - 7 min on treadmill  7.   Patient will tolerate laying flat either supine or prone in order to return to massage therapy for pain relief.  Baseline: unable to lay in prone or supine Goal status: IN PROGRESS 06/06/23- can lay prone over 4 pillows 06/28/23- can lay flatter with HOB raised 15 deg and bolster under knees.   8. Patient will increase gait speed to 1.0 m/s to improve community access.  Baseline: 0.6 m/s Goal status: IN PROGRESS 06/28/23- typical speed still 0.6 m/s, fast speed 0.8 m/s  9.  Patient will be able to lift and swing 8-10 lbs without LBP in order to return to bowling.   Baseline: unable Goal status: IN PROGRESS  10.   Patient will score at least 19/24 on DGI to demonstrate lower risk of falls.  Baseline: NT Goal status: MET 06/28/23 19/24   PLAN:  PT FREQUENCY: 2x/week  PT DURATION: 8 weeks  PLANNED INTERVENTIONS: 97110-Therapeutic exercises, 97530- Therapeutic activity, 97112- Neuromuscular re-education, 97535- Self Care, 16109- Manual therapy, [phone]- Gait training, [phone]- Aquatic Therapy, 97014- Electrical stimulation (unattended), [phone]- Electrical stimulation (manual), Patient/Family education, Balance training, Stair training, Taping, Joint mobilization, Joint manipulation, Spinal manipulation, Spinal mobilization, Scar mobilization, Cryotherapy, and Moist heat.  PLAN FOR NEXT SESSION: continue core and extensor strengthening.  Aquatic therapy.    Jena Gauss, PT, DPT 07/11/23 2:09 PM

## 2023-07-15 ENCOUNTER — Ambulatory Visit: Payer: Federal, State, Local not specified - PPO | Admitting: Physical Therapy

## 2023-07-18 ENCOUNTER — Encounter (HOSPITAL_BASED_OUTPATIENT_CLINIC_OR_DEPARTMENT_OTHER): Payer: Self-pay | Admitting: Physical Therapy

## 2023-07-18 ENCOUNTER — Ambulatory Visit (HOSPITAL_BASED_OUTPATIENT_CLINIC_OR_DEPARTMENT_OTHER): Payer: Medicare PPO | Attending: Internal Medicine | Admitting: Physical Therapy

## 2023-07-18 DIAGNOSIS — M6281 Muscle weakness (generalized): Secondary | ICD-10-CM | POA: Insufficient documentation

## 2023-07-18 DIAGNOSIS — M5459 Other low back pain: Secondary | ICD-10-CM | POA: Insufficient documentation

## 2023-07-18 DIAGNOSIS — R262 Difficulty in walking, not elsewhere classified: Secondary | ICD-10-CM | POA: Diagnosis present

## 2023-07-18 DIAGNOSIS — R293 Abnormal posture: Secondary | ICD-10-CM | POA: Insufficient documentation

## 2023-07-18 NOTE — Therapy (Signed)
 OUTPATIENT PHYSICAL THERAPY THORACOLUMBAR TREATMENT    Patient Name: Kyle Delacruz MRN: [id number] DOB:28-Apr-1956, 69 y.o., male Today's Date: 07/18/2023  END OF SESSION:  PT End of Session - 07/18/23 1416     Visit Number 12    Date for PT Re-Evaluation 07/18/23    Authorization Type [employer] MCR + BCBS  BCBS VL 50)    Progress Note Due on Visit 19    PT Start Time 1402    PT Stop Time 1440    PT Time Calculation (min) 38 min    Activity Tolerance Patient tolerated treatment well    Behavior During Therapy WFL for tasks assessed/performed                Past Medical History:  Diagnosis Date   Allergy    Arthritis    osteo   CTS (carpal tunnel syndrome)    DJD (degenerative joint disease)    DM2 (diabetes mellitus, type 2) (HCC)    DVT (deep venous thrombosis) (HCC)    GERD (gastroesophageal reflux disease)    Gout    History of adenomatous polyps of colon 12/29/2019   History of nuclear stress test    Myoview 11/17: EF 54, no ST changes, hypertensive blood pressure response, no ischemia, low risk   Hyperlipidemia    Hypothyroidism    Nicotine addiction    OSA (obstructive sleep apnea)    Prostatitis    Reactive airway disease    Sarcocystosis    Sinus arrhythmia    Sleep apnea    cpap- not wearing   Tinea corporis    Past Surgical History:  Procedure Laterality Date   BILATERAL KNEE ARTHROSCOPY     right- 1997; left 2001   COLONOSCOPY  multiple last 2013   IVC FILTER INSERTION N/A 09/17/2022   Procedure: IVC FILTER INSERTION;  Surgeon: Maeola Harman, MD;  Location: [facility] INVASIVE CV LAB;  Service: Cardiovascular;  Laterality: N/A;   IVC FILTER REMOVAL N/A 01/28/2023   Procedure: IVC FILTER REMOVAL;  Surgeon: Maeola Harman, MD;  Location: [facility] INVASIVE CV LAB;  Service: Cardiovascular;  Laterality: N/A;   KNEE SURGERY Right 2014   torn R medial meniscus- arthroscopy done   LAPAROTOMY N/A 04/10/2022   Procedure: EXPLORATORY  LAPAROTOMY, TOTAL COLECTOMY, END ILEOSTOMY;  Surgeon: Quentin Ore, MD;  Location: [facility];  Service: General;  Laterality: N/A;   PARATHYROIDECTOMY  2005   ROTATOR CUFF REPAIR  2004   right   TONSILLECTOMY     TOTAL HIP ARTHROPLASTY Left 2009   TOTAL HIP ARTHROPLASTY Right 2006   UPPER GASTROINTESTINAL ENDOSCOPY     Patient Active Problem List   Diagnosis Date Noted   History of pulmonary embolism 06/28/2022   History of DVT (deep vein thrombosis) 06/28/2022   Lumbar radiculopathy, chronic 06/28/2022   Nausea and vomiting 06/28/2022   Septic shock (HCC) 06/26/2022   SVT (supraventricular tachycardia) (HCC) 06/19/2022   PAF (paroxysmal atrial fibrillation) (HCC) 06/18/2022   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   High output ileostomy (HCC) 04/21/2022   Chronic anticoagulation 04/21/2022   Normocytic anemia 04/10/2022   Shock (HCC) 04/10/2022   Deep vein thrombosis (DVT) of femoral vein of right lower extremity (HCC) 04/09/2022   Leg DVT (deep venous thromboembolism), chronic, right (HCC) 04/08/2022   Abdominal distension 04/08/2022   Acute respiratory failure with hypoxia (HCC) 07/27/2021   PE (pulmonary thromboembolism) (HCC) 07/26/2021   Carcinoma of prostate (HCC) 07/10/2021   Chronic obstructive pulmonary disease (  HCC) 07/10/2021   HLD (hyperlipidemia) 03/10/2021   Hypothyroidism 03/10/2021   Chronic bilateral back pain 03/10/2021   History of adenomatous polyps of colon 12/29/2019   Essential hypertension 06/13/2011   Obstructive sleep apnea (adult) (pediatric) 06/13/2011   Gastro-esophageal reflux disease without esophagitis 05/02/2009    PCP: Chilton Greathouse, MD   REFERRING PROVIDER: Rosendo Gros, PA*   REFERRING DIAG: [phone] (ICD-10-CM) - Fusion of spine of lumbar region   Rationale for Evaluation and Treatment: Rehabilitation  THERAPY DIAG:  Other low back pain  Muscle weakness (generalized)  Abnormal posture  Difficulty in walking, not elsewhere  classified  ONSET DATE: Back surgery 04/04/22, PE 04/08/22, 06/26/22 Sepsis, 11/05/22 Second back surgery to remove hardware and remove IVC filter  SUBJECTIVE:                                                                                                                                                                                           SUBJECTIVE STATEMENT: Pain seems to have moved up a little higher, worked with a Psychologist, educational at [employer] and it seems like I have hurt since.  Had a massage  POOL ACCESS:  Pt has membership to [facility] with silver sneakers. Pt reports he hasn't been to pool since fall 2024.   FROM IO:[facility] said I need to strengthen my back muscles cause I can't stand up and walk like I want to, I know I can take a fall now, I was sitting around a fire circle and flipped back in a chair, got an x-ray and everything was good, didn't loosen the hardware, bone is healing though everything.  My neck has been a little sore from that too. Has had a lot of surgeries starting with original back surgery on 04/04/22, developed PE on 04/08/22, then developed sepsis on 06/26/22, had increased back pain starting in March 2024, cage in back came loose, had hardware removed and IVC on 11/05/22.  Can't lay flat because sleeping in recliner which also makes neck sore.  Would like to get back to swimming too.   PERTINENT HISTORY:  T2DM, osteoarthritis, history DVT, chronic anticoagulation, prostate cancer, HTN, HLD, COPD, OSA, GERD, hypothyroidism, chronic LBP, R THA 2006, L THA 2009, previous back surgery  PAIN:  Are you having pain? Yes: NPRS scale: 4/10 Pain location: L "butt bone" pointing to L hip   Pain description: ache Aggravating factors: standing and walking > 30 min  Relieving factors: sitting in recliner  PRECAUTIONS: Fall and Other: has ostomy  RED FLAGS: None   WEIGHT BEARING RESTRICTIONS: No  FALLS:  Has patient fallen in last 6 months? Yes. Number  of falls 1 - fell backwards in  chair and tripped on R foot, weakness in R ankle DF  LIVING ENVIRONMENT: Lives with: lives with their spouse Lives in: House/apartment Stairs: Yes: External: 6 steps; on left going up 2nd floor bonus room but does not go up those stairs, living area on 1st floor, 6 steps to garage Has following equipment at home: Tour manager, Grab bars, and scooter  OCCUPATION: retired from post office  PLOF: Independent and Leisure: swimming  PATIENT GOALS: return to swimming, be able to walk without pain, walk on treadmill, and bowl again  NEXT MD VISIT: in 5 months  OBJECTIVE:   DIAGNOSTIC FINDINGS:  11/06/22 XR Lumbar spine IMPRESSION:  1. L2-L5 posterior lumbar fusion with instrumentation and anterior  lumbar discectomy. Stable alignment.   PATIENT SURVEYS:  Modified Oswestry 22/50   COGNITION: Overall cognitive status: Within functional limits for tasks assessed     SENSATION: WFL  MUSCLE LENGTH: Hamstrings: significant tightness bilaterally  POSTURE: decreased lumbar lordosis, maintains 20 deg forward hip flexion  PALPATION: Diffuse tenderness low back   LUMBAR ROM:  lumbar AROM limited by fusion, no pain with flexion, unable to extend to neutral due to pain.   LOWER EXTREMITY ROM:   significant tightness bil hips R >L for both IR and ER.    LOWER EXTREMITY MMT:    MMT Right* eval Left* eval Right* 06/28/23  Hip flexion 4 5 4+  Hip extension 3 3   Hip abduction 4 5 4   Hip adduction 4+ 5 5  Knee flexion 5 5 5   Knee extension 5 5 5   Ankle dorsiflexion 3 5 3+  Ankle plantarflexion 4 5 4+   (Blank rows = not tested) * tested in sitting  LUMBAR SPECIAL TESTS:  NA   FUNCTIONAL TESTS:  5 times sit to stand: 15.6 sec    - 240'   Gait speed 0.6 m/s GAIT: Distance walked: 240' Assistive device utilized: None Level of assistance: Complete Independence Comments: wide BOS, forward flexed posture, visually slow     TODAY'S TREATMENT:                                                                                                                               DATE:  07/18/23 Pt seen for aquatic therapy today.  Treatment took place in water 3.5-4.75 ft in depth at the [facility] pool. Temp of water was 91.  Pt entered/exited the pool via stairs independently with bilat rail.  - submerged 4.8 ft unsupported: walking forward/ backwards  - L stretch - bow & arrow x 10 -  yellow HB push downs -Scapular retraction using rider band 2 x 10 -hip hiking bottom step 2 x 8.  Some discomfort left buttock with descending of left hip -solid noodle press for TrA sets in wide stance then staggered x 1-10 - farmer carry with rainbow hand floats-> yellow HB at side forward, back and  side stepping. Cues for core engagement - hip flex stretch at 2nd step  Pt requires the buoyancy and hydrostatic pressure of water for support, and to offload joints by unweighting joint load by at least 50 % in navel deep water and by at least 75-80% in chest to neck deep water.  Viscosity of the water is needed for resistance of strengthening. Water current perturbations provides challenge to standing balance requiring increased core activation.   07/11/23  Therapeutic Exercise: to improve strength and mobility.  Demo, verbal and tactile cues throughout for technique. Treadmill Incline 2, 1.0 mph x 6 min  Hip extensions 2 x 15 bil - bent over with arms stabilized on physioball to engage core, SBA for safety Leg Press 25# x 20, single leg press 25# x 10 R/L, Ankle press 25# x 15 bil  Hamstring curls 20# x 20 Leg extensions 20# x 20  Lat pull down 25# x 10 standing (SBA needed for safety, unsteady) x 10 seated  Manual Therapy:  IASTM with Percussive device to L glute to decrease pain/spasm     07/08/2023 Pt seen for aquatic therapy today.  Treatment took place in water 3.5-4.75 ft in depth at the [facility] pool. Temp of water was 91.  Pt entered/exited the pool  via stairs independently with bilat rail.  - submerged 70, unsupported: walking forward/ backwards  - L stretch - farmer carry with rainbow hand floats-> yellow HB at side forward, back and side stepping. Cues for core engagement - side stepping with arm addct -> with arm addct with rainbow hand floats  - plank position with hands on bench in water (yellow noodle under arms) x 10 LE hip ext each; fire hydrants and mountain climbers x 5 ea -hip hiking bottom step 2 x 8.  Some discomfort left buttock with descending of left hip -  yellow HB push downs - staggered stance with bilat horiz abdct/ addct with rainbow hand floats x 5 each LE forward - bow & arrow x 10 - hip flex and quad stretch on 2nd step (good stretch)  Pt requires the buoyancy and hydrostatic pressure of water for support, and to offload joints by unweighting joint load by at least 50 % in navel deep water and by at least 75-80% in chest to neck deep water.  Viscosity of the water is needed for resistance of strengthening. Water current perturbations provides challenge to standing balance requiring increased core activation.   06/28/23 Tests & Measures DGI MMT Modified Oswestry Gait Speed Therapeutic Exercise: to improve strength and mobility.  Demo, verbal and tactile cues throughout for technique. Treadmill Incline 2 speed 0.9 x 6 min for warm-up & subjective Self Care: Discussion of sleep positioning while positioned in supine with HOB elevated 15 deg and bolster under knees, importance of prioritizing sleep in bed instead of recliner for hip flexor stretching and cervical positioning.      PATIENT EDUCATION:  Education details: aquatic exercise rationale and modifications Person educated: Patient Education method: Explanation, Demonstration, and Handouts Education comprehension: verbalized understanding and returned demonstration  HOME EXERCISE PROGRAM: Access Code: [email or web address] URL:  [email or web address] Date: 05/27/2023 Prepared by: Harrie Foreman  Exercises - Supine Hip Internal and External Rotation  - 1 x daily - 7 x weekly - 1 sets - 10 reps - Standing Hip Extension with Counter Support  - 1 x daily - 7 x weekly - 2-3 sets - 10 reps - modified to bent over on counter.  - Standing Lumbar  Extension with Counter  - 1 x daily - 7 x weekly - 1 sets - 10 reps - Modified Thomas Stretch  - 1 x daily - 7 x weekly - 1 sets - 3 reps - 1 min hold  ASSESSMENT:  CLINICAL IMPRESSION: Pt will return to primary PT next visit for re-assess/cert before returning to aquatics.  He reports today with increased left sided LBP after working with a trainer over w/e and lifting weights.  Pain is slightly higher than original pain in left SI area and is moderately TTP. Focus again on post core strength and posture.  He does report some discomfort in that left SI area but states  that it is a good hurt.  Ood session.  Goals ongoing.     OBJECTIVE IMPAIRMENTS: Abnormal gait, decreased activity tolerance, decreased balance, decreased endurance, decreased mobility, difficulty walking, decreased ROM, decreased strength, impaired perceived functional ability, increased muscle spasms, impaired flexibility, impaired sensation, postural dysfunction, and pain.   ACTIVITY LIMITATIONS: carrying, lifting, bending, standing, squatting, sleeping, stairs, transfers, bed mobility, and locomotion level  PARTICIPATION LIMITATIONS: meal prep, cleaning, laundry, driving, shopping, community activity, and occupation  PERSONAL FACTORS: Time since onset of injury/illness/exacerbation and 3+ comorbidities: T2DM, osteoarthritis, history DVT, chronic anticoagulation, prostate cancer, HTN, HLD, COPD, OSA, GERD, hypothyroidism, chronic LBP, R THA 2006, L THA 2009, previous back surgery  are also affecting patient's functional outcome.   REHAB POTENTIAL: Good  CLINICAL DECISION MAKING:  Evolving/moderate complexity  EVALUATION COMPLEXITY: Moderate   GOALS: Goals reviewed with patient? Yes  SHORT TERM GOALS: Target date: 06/06/2023   Patient will be independent with initial HEP.  Baseline:  Goal status: MET   LONG TERM GOALS: Target date: 07/18/2023   Patient will be independent with advanced/ongoing HEP to improve outcomes and carryover.  Baseline:  Goal status: IN PROGRESS 06/28/23- met for current  2.  Patient will report 75% improvement in low back pain to improve QOL.  Baseline: 5/10 Goal status: MET 06/28/23- back pain has resolved, just having pain in L buttock now.   3.  Patient will be able to stand erect without increased low back pain.    Baseline: stand with flexed posture maintaining ~ 20 deg hip flexion.  Goal status: IN PROGRESS 06/28/23- 15 deg hip flexion in standing   4.  Patient will demonstrate improved functional strength as demonstrated by 4+/5 RLE strength. Baseline: see objective Goal status: IN PROGRESS 06/28/23- improving strength overall, see objective  5.  Patient will report at least 6 points improvement on modified Oswestry to demonstrate improved functional ability.  Baseline: 22/50 Goal status: MET 06/28/23 11/50   6.  Patient will tolerate 15 min of treadmill walking to start exercising at gym. Baseline: unable Goal status: IN PROGRESS 06/06/23 - 7 min on treadmill  7.   Patient will tolerate laying flat either supine or prone in order to return to massage therapy for pain relief.  Baseline: unable to lay in prone or supine Goal status: IN PROGRESS 06/06/23- can lay prone over 4 pillows 06/28/23- can lay flatter with HOB raised 15 deg and bolster under knees.   8. Patient will increase gait speed to 1.0 m/s to improve community access.  Baseline: 0.6 m/s Goal status: IN PROGRESS 06/28/23- typical speed still 0.6 m/s, fast speed 0.8 m/s  9.  Patient will be able to lift and swing 8-10 lbs without LBP in order to return to bowling.    Baseline: unable Goal status: IN PROGRESS  10.  Patient will score at least 19/24 on DGI to demonstrate lower risk of falls.  Baseline: NT Goal status: MET 06/28/23 19/24   PLAN:  PT FREQUENCY: 2x/week  PT DURATION: 8 weeks  PLANNED INTERVENTIONS: 97110-Therapeutic exercises, 97530- Therapeutic activity, 97112- Neuromuscular re-education, 97535- Self Care, 78469- Manual therapy, [phone]- Gait training, [phone]- Aquatic Therapy, 97014- Electrical stimulation (unattended), [phone]- Electrical stimulation (manual), Patient/Family education, Balance training, Stair training, Taping, Joint mobilization, Joint manipulation, Spinal manipulation, Spinal mobilization, Scar mobilization, Cryotherapy, and Moist heat.  PLAN FOR NEXT SESSION: continue core and extensor strengthening.  Aquatic therapy.    [address] [place]) Kunaal Walkins MPT 07/18/23 2:50 PM [facility] [address] [place], [place], [address] Phone: [phone]   Fax:  [phone]

## 2023-07-24 ENCOUNTER — Ambulatory Visit: Attending: Internal Medicine | Admitting: Physical Therapy

## 2023-07-24 ENCOUNTER — Encounter: Payer: Self-pay | Admitting: Physical Therapy

## 2023-07-24 DIAGNOSIS — R293 Abnormal posture: Secondary | ICD-10-CM | POA: Insufficient documentation

## 2023-07-24 DIAGNOSIS — M6281 Muscle weakness (generalized): Secondary | ICD-10-CM | POA: Diagnosis present

## 2023-07-24 DIAGNOSIS — R262 Difficulty in walking, not elsewhere classified: Secondary | ICD-10-CM | POA: Diagnosis present

## 2023-07-24 DIAGNOSIS — M5459 Other low back pain: Secondary | ICD-10-CM | POA: Insufficient documentation

## 2023-07-24 NOTE — Therapy (Signed)
 OUTPATIENT PHYSICAL THERAPY THORACOLUMBAR TREATMENT/RECERT    Patient Name: Kyle Delacruz MRN: [id number] DOB:11-Jan-1956, 68 y.o., male Today's Date: 07/24/2023  END OF SESSION:  PT End of Session - 07/24/23 1323     Visit Number 13    Date for PT Re-Evaluation 09/04/23    Authorization Type [employer] MCR + BCBS  BCBS VL 50)    Progress Note Due on Visit 19    PT Start Time 1316    PT Stop Time 1403    PT Time Calculation (min) 47 min    Activity Tolerance Patient tolerated treatment well    Behavior During Therapy WFL for tasks assessed/performed                Past Medical History:  Diagnosis Date   Allergy    Arthritis    osteo   CTS (carpal tunnel syndrome)    DJD (degenerative joint disease)    DM2 (diabetes mellitus, type 2) (HCC)    DVT (deep venous thrombosis) (HCC)    GERD (gastroesophageal reflux disease)    Gout    History of adenomatous polyps of colon 12/29/2019   History of nuclear stress test    Myoview 11/17: EF 54, no ST changes, hypertensive blood pressure response, no ischemia, low risk   Hyperlipidemia    Hypothyroidism    Nicotine addiction    OSA (obstructive sleep apnea)    Prostatitis    Reactive airway disease    Sarcocystosis    Sinus arrhythmia    Sleep apnea    cpap- not wearing   Tinea corporis    Past Surgical History:  Procedure Laterality Date   BILATERAL KNEE ARTHROSCOPY     right- 1997; left 2001   COLONOSCOPY  multiple last 2013   IVC FILTER INSERTION N/A 09/17/2022   Procedure: IVC FILTER INSERTION;  Surgeon: Maeola Harman, MD;  Location: [facility] INVASIVE CV LAB;  Service: Cardiovascular;  Laterality: N/A;   IVC FILTER REMOVAL N/A 01/28/2023   Procedure: IVC FILTER REMOVAL;  Surgeon: Maeola Harman, MD;  Location: [facility] INVASIVE CV LAB;  Service: Cardiovascular;  Laterality: N/A;   KNEE SURGERY Right 2014   torn R medial meniscus- arthroscopy done   LAPAROTOMY N/A 04/10/2022   Procedure:  EXPLORATORY LAPAROTOMY, TOTAL COLECTOMY, END ILEOSTOMY;  Surgeon: Quentin Ore, MD;  Location: [facility]S;  Service: General;  Laterality: N/A;   PARATHYROIDECTOMY  2005   ROTATOR CUFF REPAIR  2004   right   TONSILLECTOMY     TOTAL HIP ARTHROPLASTY Left 2009   TOTAL HIP ARTHROPLASTY Right 2006   UPPER GASTROINTESTINAL ENDOSCOPY     Patient Active Problem List   Diagnosis Date Noted   History of pulmonary embolism 06/28/2022   History of DVT (deep vein thrombosis) 06/28/2022   Lumbar radiculopathy, chronic 06/28/2022   Nausea and vomiting 06/28/2022   Septic shock (HCC) 06/26/2022   SVT (supraventricular tachycardia) (HCC) 06/19/2022   PAF (paroxysmal atrial fibrillation) (HCC) 06/18/2022   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   High output ileostomy (HCC) 04/21/2022   Chronic anticoagulation 04/21/2022   Normocytic anemia 04/10/2022   Shock (HCC) 04/10/2022   Deep vein thrombosis (DVT) of femoral vein of right lower extremity (HCC) 04/09/2022   Leg DVT (deep venous thromboembolism), chronic, right (HCC) 04/08/2022   Abdominal distension 04/08/2022   Acute respiratory failure with hypoxia (HCC) 07/27/2021   PE (pulmonary thromboembolism) (HCC) 07/26/2021   Carcinoma of prostate (HCC) 07/10/2021   Chronic obstructive pulmonary disease (  HCC) 07/10/2021   HLD (hyperlipidemia) 03/10/2021   Hypothyroidism 03/10/2021   Chronic bilateral back pain 03/10/2021   History of adenomatous polyps of colon 12/29/2019   Essential hypertension 06/13/2011   Obstructive sleep apnea (adult) (pediatric) 06/13/2011   Gastro-esophageal reflux disease without esophagitis 05/02/2009    PCP: Chilton Greathouse, MD   REFERRING PROVIDER: Nadara Mode PA-C  REFERRING DIAG: [phone] (ICD-10-CM) - Fusion of spine of lumbar region   Rationale for Evaluation and Treatment: Rehabilitation  THERAPY DIAG:  Other low back pain  Muscle weakness (generalized)  Abnormal posture  Difficulty in walking, not  elsewhere classified  ONSET DATE: Back surgery 04/04/22, PE 04/08/22, 06/26/22 Sepsis, 11/05/22 Second back surgery to remove hardware and remove IVC filter  SUBJECTIVE:                                                                                                                                                                                           SUBJECTIVE STATEMENT: Having more pain/tingling down L side, worried sciatica starting up again.  Doing stretching class at church on Saturdays, mostly chair exercises.   POOL ACCESS:  Pt has membership to [facility] with silver sneakers. Pt reports he hasn't been to pool since fall 2024.   FROM YQ:MVHQ said I need to strengthen my back muscles cause I can't stand up and walk like I want to, I know I can take a fall now, I was sitting around a fire circle and flipped back in a chair, got an x-ray and everything was good, didn't loosen the hardware, bone is healing though everything.  My neck has been a little sore from that too. Has had a lot of surgeries starting with original back surgery on 04/04/22, developed PE on 04/08/22, then developed sepsis on 06/26/22, had increased back pain starting in March 2024, cage in back came loose, had hardware removed and IVC on 11/05/22.  Can't lay flat because sleeping in recliner which also makes neck sore.  Would like to get back to swimming too.   PERTINENT HISTORY:  T2DM, osteoarthritis, history DVT, chronic anticoagulation, prostate cancer, HTN, HLD, COPD, OSA, GERD, hypothyroidism, chronic LBP, R THA 2006, L THA 2009, previous back surgery  PAIN:  Are you having pain? Yes: NPRS scale: 4-5/10 Pain location: L "butt bone" pointing to L hip   Pain description: ache Aggravating factors: standing and walking > 30 min  Relieving factors: sitting in recliner  PRECAUTIONS: Fall and Other: has ostomy  RED FLAGS: None   WEIGHT BEARING RESTRICTIONS: No  FALLS:  Has patient fallen in last 6 months? Yes. Number of  falls 1 - fell  backwards in chair and tripped on R foot, weakness in R ankle DF  LIVING ENVIRONMENT: Lives with: lives with their spouse Lives in: House/apartment Stairs: Yes: External: 6 steps; on left going up 2nd floor bonus room but does not go up those stairs, living area on 1st floor, 6 steps to garage Has following equipment at home: Tour manager, Grab bars, and scooter  OCCUPATION: retired from post office  PLOF: Independent and Leisure: swimming  PATIENT GOALS: return to swimming, be able to walk without pain, walk on treadmill, and bowl again  NEXT MD VISIT: in 5 months  OBJECTIVE:   DIAGNOSTIC FINDINGS:  11/06/22 XR Lumbar spine IMPRESSION:  1. L2-L5 posterior lumbar fusion with instrumentation and anterior  lumbar discectomy. Stable alignment.   PATIENT SURVEYS:  Modified Oswestry 22/50   COGNITION: Overall cognitive status: Within functional limits for tasks assessed     SENSATION: WFL  MUSCLE LENGTH: Hamstrings: significant tightness bilaterally  POSTURE: decreased lumbar lordosis, maintains 20 deg forward hip flexion  PALPATION: Diffuse tenderness low back   LUMBAR ROM:  lumbar AROM limited by fusion, no pain with flexion, unable to extend to neutral due to pain.   LOWER EXTREMITY ROM:   significant tightness bil hips R >L for both IR and ER.    LOWER EXTREMITY MMT:    MMT Right* eval Left* eval Right* 06/28/23  Hip flexion 4 5 4+  Hip extension 3 3   Hip abduction 4 5 4   Hip adduction 4+ 5 5  Knee flexion 5 5 5   Knee extension 5 5 5   Ankle dorsiflexion 3 5 3+  Ankle plantarflexion 4 5 4+   (Blank rows = not tested) * tested in sitting  LUMBAR SPECIAL TESTS:  NA   FUNCTIONAL TESTS:  5 times sit to stand: 15.6 sec    - 240'   Gait speed 0.6 m/s GAIT: Distance walked: 240' Assistive device utilized: None Level of assistance: Complete Independence Comments: wide BOS, forward flexed posture, visually slow     TODAY'S  TREATMENT:                                                                                                                              DATE:   07/24/2023 Therapeutic Exercise: to improve strength and mobility.  Demo, verbal and tactile cues throughout for technique. Sciatic nerve glide seated Pelvic tilts seated Standing hip flexor stretches Treadmill 1.0 mph, 2 incline, x 7 min  Hip extension GTB x 20 R/L Glut med isometric standing ball press against wall 3 x 10 sec hold  Therapeutic Activity:  to progress towards functional activities like bowling Suitcase carry with 10# x 90' each side Swings with kettle ball 5# - unilateral x 10 each side, in center x 10 - cues to activate glutes but primarily using UE to raise  Manual Therapy: to decrease muscle spasm and pain and improve mobility IASTM with percussive device to L piriformis,  STM/TPR to L glut med/piriformis, skilled palpation and monitoring during dry needling. Trigger Point Dry Needling  Subsequent Treatment: Instructions provided previously at initial dry needling treatment.  Instructions reviewed, if requested by the patient, prior to subsequent dry needling treatment.   Patient Verbal Consent Given: Yes Education Handout Provided: Previously Provided Muscles Treated: L piriformis, L glut med Electrical Stimulation Performed: No Treatment Response/Outcome: Twitch Response Elicited and Palpable Increase in Muscle Length  07/18/23 Pt seen for aquatic therapy today.  Treatment took place in water 3.5-4.75 ft in depth at the [facility] pool. Temp of water was 91.  Pt entered/exited the pool via stairs independently with bilat rail.  - submerged 4.8 ft unsupported: walking forward/ backwards  - L stretch - bow & arrow x 10 -  yellow HB push downs -Scapular retraction using rider band 2 x 10 -hip hiking bottom step 2 x 8.  Some discomfort left buttock with descending of left hip -solid noodle press for TrA sets in  wide stance then staggered x 1-10 - farmer carry with rainbow hand floats-> yellow HB at side forward, back and side stepping. Cues for core engagement - hip flex stretch at 2nd step  Pt requires the buoyancy and hydrostatic pressure of water for support, and to offload joints by unweighting joint load by at least 50 % in navel deep water and by at least 75-80% in chest to neck deep water.  Viscosity of the water is needed for resistance of strengthening. Water current perturbations provides challenge to standing balance requiring increased core activation.   07/11/23  Therapeutic Exercise: to improve strength and mobility.  Demo, verbal and tactile cues throughout for technique. Treadmill Incline 2, 1.0 mph x 6 min  Hip extensions 2 x 15 bil - bent over with arms stabilized on physioball to engage core, SBA for safety Leg Press 25# x 20, single leg press 25# x 10 R/L, Ankle press 25# x 15 bil  Hamstring curls 20# x 20 Leg extensions 20# x 20  Lat pull down 25# x 10 standing (SBA needed for safety, unsteady) x 10 seated  Manual Therapy:  IASTM with Percussive device to L glute to decrease pain/spasm     07/08/2023 Pt seen for aquatic therapy today.  Treatment took place in water 3.5-4.75 ft in depth at the [facility] pool. Temp of water was 91.  Pt entered/exited the pool via stairs independently with bilat rail.  - submerged 70, unsupported: walking forward/ backwards  - L stretch - farmer carry with rainbow hand floats-> yellow HB at side forward, back and side stepping. Cues for core engagement - side stepping with arm addct -> with arm addct with rainbow hand floats  - plank position with hands on bench in water (yellow noodle under arms) x 10 LE hip ext each; fire hydrants and mountain climbers x 5 ea -hip hiking bottom step 2 x 8.  Some discomfort left buttock with descending of left hip -  yellow HB push downs - staggered stance with bilat horiz abdct/ addct with  rainbow hand floats x 5 each LE forward - bow & arrow x 10 - hip flex and quad stretch on 2nd step (good stretch)  Pt requires the buoyancy and hydrostatic pressure of water for support, and to offload joints by unweighting joint load by at least 50 % in navel deep water and by at least 75-80% in chest to neck deep water.  Viscosity of the water is needed for resistance of  strengthening. Water current perturbations provides challenge to standing balance requiring increased core activation.   06/28/23 Tests & Measures DGI MMT Modified Oswestry Gait Speed Therapeutic Exercise: to improve strength and mobility.  Demo, verbal and tactile cues throughout for technique. Treadmill Incline 2 speed 0.9 x 6 min for warm-up & subjective Self Care: Discussion of sleep positioning while positioned in supine with HOB elevated 15 deg and bolster under knees, importance of prioritizing sleep in bed instead of recliner for hip flexor stretching and cervical positioning.      PATIENT EDUCATION:  Education details: continue HEP as tolerated Person educated: Patient Education method: Explanation, Demonstration, and Handouts Education comprehension: verbalized understanding and returned demonstration  HOME EXERCISE PROGRAM: Access Code: [email or web address] URL: [email or web address] Date: 05/27/2023 Prepared by: Harrie Foreman  Exercises - Supine Hip Internal and External Rotation  - 1 x daily - 7 x weekly - 1 sets - 10 reps - Standing Hip Extension with Counter Support  - 1 x daily - 7 x weekly - 2-3 sets - 10 reps - modified to bent over on counter.  - Standing Lumbar Extension with Counter  - 1 x daily - 7 x weekly - 1 sets - 10 reps - Modified Thomas Stretch  - 1 x daily - 7 x weekly - 1 sets - 3 reps - 1 min hold  ASSESSMENT:  CLINICAL IMPRESSION: DACOTA RUBEN is making good progress towards goals but reports increasing L gluteal/piriformis pain with increased activity now.    Continues to have forward flexed posture, with poor glute activation, noted reporting more pain with activities isolating L glute medius especially.  Did not report any back pain with swinging kettle bells but primarily using arms to swing.  With palpation noted trigger points in L glut med and L piriformis, after  review of DN rational, procedures, outcomes and potential side effects, patient verbalized consent to DN treatment in conjunction with manual STM/DTM and TPR to reduce ttp/muscle tension. Muscles treated as indicated above. DN produced normal response with good twitches elicited resulting in palpable reduction in pain/ttp and muscle tension.  Umer Harig [facility] continues to demonstrate potential for improvement and would benefit from  extension of skilled therapy for additional 2x/week for 4 more weeks to address impairments and resolve L sided gluteal pain.       OBJECTIVE IMPAIRMENTS: Abnormal gait, decreased activity tolerance, decreased balance, decreased endurance, decreased mobility, difficulty walking, decreased ROM, decreased strength, impaired perceived functional ability, increased muscle spasms, impaired flexibility, impaired sensation, postural dysfunction, and pain.   ACTIVITY LIMITATIONS: carrying, lifting, bending, standing, squatting, sleeping, stairs, transfers, bed mobility, and locomotion level  PARTICIPATION LIMITATIONS: meal prep, cleaning, laundry, driving, shopping, community activity, and occupation  PERSONAL FACTORS: Time since onset of injury/illness/exacerbation and 3+ comorbidities: T2DM, osteoarthritis, history DVT, chronic anticoagulation, prostate cancer, HTN, HLD, COPD, OSA, GERD, hypothyroidism, chronic LBP, R THA 2006, L THA 2009, previous back surgery  are also affecting patient's functional outcome.   REHAB POTENTIAL: Good  CLINICAL DECISION MAKING: Evolving/moderate complexity  EVALUATION COMPLEXITY: Moderate   GOALS: Goals reviewed with patient?  Yes  SHORT TERM GOALS: Target date: 06/06/2023   Patient will be independent with initial HEP.  Baseline:  Goal status: MET   LONG TERM GOALS: Target date: 07/18/2023 extended to 08/21/23  Patient will be independent with advanced/ongoing HEP to improve outcomes and carryover.  Baseline:  Goal status: IN PROGRESS 06/28/23- met for current  2.  Patient will report 75% improvement in low back  pain to improve QOL.  Baseline: 5/10 Goal status: MET 06/28/23- back pain has resolved, just having pain in L buttock now.   3.  Patient will be able to stand erect without increased low back pain.    Baseline: stand with flexed posture maintaining ~ 20 deg hip flexion.  Goal status: IN PROGRESS 06/28/23- 15 deg hip flexion in standing  07/24/23 still flexed at hips ~ 10 deg  4.  Patient will demonstrate improved functional strength as demonstrated by 4+/5 RLE strength. Baseline: see objective Goal status: IN PROGRESS 06/28/23- improving strength overall, see objective  5.  Patient will report at least 6 points improvement on modified Oswestry to demonstrate improved functional ability.  Baseline: 22/50 Goal status: MET 06/28/23 11/50   6.  Patient will tolerate 15 min of treadmill walking to start exercising at gym. Baseline: unable Goal status: IN PROGRESS 06/06/23 - 7 min on treadmill 07/24/23- 7 min on treadmill, limited by L gluteal pain  7.   Patient will tolerate laying flat either supine or prone in order to return to massage therapy for pain relief.  Baseline: unable to lay in prone or supine Goal status: MET 06/06/23- can lay prone over 4 pillows 06/28/23- can lay flatter with HOB raised 15 deg and bolster under knees.  07/24/23- still needs pillows or HOB raised, but was able to get massage successfully.   8. Patient will increase gait speed to 1.0 m/s to improve community access.  Baseline: 0.6 m/s Goal status: IN PROGRESS 07/24/23- typical speed still 0.6 m/s, fast speed 0.8 m/s    9.   Patient will be able to lift and swing 8-10 lbs without LBP in order to return to bowling.   Baseline: unable Goal status: IN PROGRESS 07/24/23- able to carry 10#, swing 5# without pain  10.   Patient will score at least 19/24 on DGI to demonstrate lower risk of falls.  Baseline: NT Goal status: MET 06/28/23 19/24  11.  Patient will be able to complete activities without limitation from L gluteal pain Baseline: 4-5/10 L sciatic symptoms, worsening with standing/walking activities.  Goal status: INITIAL  PLAN:  PT FREQUENCY: 2x/week  PT DURATION: 4 weeks  PLANNED INTERVENTIONS: 97110-Therapeutic exercises, 97530- Therapeutic activity, 97112- Neuromuscular re-education, 97535- Self Care, 16109- Manual therapy, [phone]- Gait training, [phone]- Aquatic Therapy, [id number]- Electrical stimulation (unattended), [phone]- Electrical stimulation (manual), Patient/Family education, Balance training, Stair training, Taping, Joint mobilization, Joint manipulation, Spinal manipulation, Spinal mobilization, Scar mobilization, Cryotherapy, and Moist heat.  PLAN FOR NEXT SESSION: continue core and extensor strengthening.  Aquatic therapy.  Manual/modalities PRN for L piriformis/gluteal pain.    Jena Gauss, PT  07/24/23 2:25 PM

## 2023-07-25 ENCOUNTER — Encounter (HOSPITAL_BASED_OUTPATIENT_CLINIC_OR_DEPARTMENT_OTHER): Payer: Self-pay | Admitting: Physical Therapy

## 2023-07-25 ENCOUNTER — Ambulatory Visit (HOSPITAL_BASED_OUTPATIENT_CLINIC_OR_DEPARTMENT_OTHER): Payer: Medicare PPO | Admitting: Physical Therapy

## 2023-07-25 DIAGNOSIS — M5459 Other low back pain: Secondary | ICD-10-CM | POA: Diagnosis not present

## 2023-07-25 DIAGNOSIS — R293 Abnormal posture: Secondary | ICD-10-CM

## 2023-07-25 DIAGNOSIS — M6281 Muscle weakness (generalized): Secondary | ICD-10-CM

## 2023-07-25 NOTE — Therapy (Signed)
 OUTPATIENT PHYSICAL THERAPY THORACOLUMBAR TREATMENT     Patient Name: Kyle Delacruz MRN: [id number] DOB:1955-11-05, 68 y.o., male Today's Date: 07/25/2023  END OF SESSION:  PT End of Session - 07/25/23 1151     Visit Number 14    Date for PT Re-Evaluation 08/21/23    Authorization Type [employer] MCR + BCBS  BCBS VL 50)    Progress Note Due on Visit 19    PT Start Time 1146    PT Stop Time 1225    PT Time Calculation (min) 39 min    Activity Tolerance Patient tolerated treatment well    Behavior During Therapy WFL for tasks assessed/performed                Past Medical History:  Diagnosis Date   Allergy    Arthritis    osteo   CTS (carpal tunnel syndrome)    DJD (degenerative joint disease)    DM2 (diabetes mellitus, type 2) (HCC)    DVT (deep venous thrombosis) (HCC)    GERD (gastroesophageal reflux disease)    Gout    History of adenomatous polyps of colon 12/29/2019   History of nuclear stress test    Myoview 11/17: EF 54, no ST changes, hypertensive blood pressure response, no ischemia, low risk   Hyperlipidemia    Hypothyroidism    Nicotine addiction    OSA (obstructive sleep apnea)    Prostatitis    Reactive airway disease    Sarcocystosis    Sinus arrhythmia    Sleep apnea    cpap- not wearing   Tinea corporis    Past Surgical History:  Procedure Laterality Date   BILATERAL KNEE ARTHROSCOPY     right- 1997; left 2001   COLONOSCOPY  multiple last 2013   IVC FILTER INSERTION N/A 09/17/2022   Procedure: IVC FILTER INSERTION;  Surgeon: Maeola Harman, MD;  Location: [facility] INVASIVE CV LAB;  Service: Cardiovascular;  Laterality: N/A;   IVC FILTER REMOVAL N/A 01/28/2023   Procedure: IVC FILTER REMOVAL;  Surgeon: Maeola Harman, MD;  Location: [facility] INVASIVE CV LAB;  Service: Cardiovascular;  Laterality: N/A;   KNEE SURGERY Right 2014   torn R medial meniscus- arthroscopy done   LAPAROTOMY N/A 04/10/2022   Procedure: EXPLORATORY  LAPAROTOMY, TOTAL COLECTOMY, END ILEOSTOMY;  Surgeon: Quentin Ore, MD;  Location: [facility];  Service: General;  Laterality: N/A;   PARATHYROIDECTOMY  2005   ROTATOR CUFF REPAIR  2004   right   TONSILLECTOMY     TOTAL HIP ARTHROPLASTY Left 2009   TOTAL HIP ARTHROPLASTY Right 2006   UPPER GASTROINTESTINAL ENDOSCOPY     Patient Active Problem List   Diagnosis Date Noted   History of pulmonary embolism 06/28/2022   History of DVT (deep vein thrombosis) 06/28/2022   Lumbar radiculopathy, chronic 06/28/2022   Nausea and vomiting 06/28/2022   Septic shock (HCC) 06/26/2022   SVT (supraventricular tachycardia) (HCC) 06/19/2022   PAF (paroxysmal atrial fibrillation) (HCC) 06/18/2022   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   High output ileostomy (HCC) 04/21/2022   Chronic anticoagulation 04/21/2022   Normocytic anemia 04/10/2022   Shock (HCC) 04/10/2022   Deep vein thrombosis (DVT) of femoral vein of right lower extremity (HCC) 04/09/2022   Leg DVT (deep venous thromboembolism), chronic, right (HCC) 04/08/2022   Abdominal distension 04/08/2022   Acute respiratory failure with hypoxia (HCC) 07/27/2021   PE (pulmonary thromboembolism) (HCC) 07/26/2021   Carcinoma of prostate (HCC) 07/10/2021   Chronic obstructive pulmonary  disease (HCC) 07/10/2021   HLD (hyperlipidemia) 03/10/2021   Hypothyroidism 03/10/2021   Chronic bilateral back pain 03/10/2021   History of adenomatous polyps of colon 12/29/2019   Essential hypertension 06/13/2011   Obstructive sleep apnea (adult) (pediatric) 06/13/2011   Gastro-esophageal reflux disease without esophagitis 05/02/2009    PCP: Chilton Greathouse, MD   REFERRING PROVIDER: Nadara Mode PA-C  REFERRING DIAG: [phone] (ICD-10-CM) - Fusion of spine of lumbar region   Rationale for Evaluation and Treatment: Rehabilitation  THERAPY DIAG:  Other low back pain  Muscle weakness (generalized)  Abnormal posture  ONSET DATE: Back surgery 04/04/22, PE  04/08/22, 06/26/22 Sepsis, 11/05/22 Second back surgery to remove hardware and remove IVC filter  SUBJECTIVE:                                                                                                                                                                                           SUBJECTIVE STATEMENT: Pt reports left foot numbness today.  POOL ACCESS:  Pt has membership to [facility] with silver sneakers. Pt reports he hasn't been to pool since fall 2024.   FROM UJ:WJXB said I need to strengthen my back muscles cause I can't stand up and walk like I want to, I know I can take a fall now, I was sitting around a fire circle and flipped back in a chair, got an x-ray and everything was good, didn't loosen the hardware, bone is healing though everything.  My neck has been a little sore from that too. Has had a lot of surgeries starting with original back surgery on 04/04/22, developed PE on 04/08/22, then developed sepsis on 06/26/22, had increased back pain starting in March 2024, cage in back came loose, had hardware removed and IVC on 11/05/22.  Can't lay flat because sleeping in recliner which also makes neck sore.  Would like to get back to swimming too.   PERTINENT HISTORY:  T2DM, osteoarthritis, history DVT, chronic anticoagulation, prostate cancer, HTN, HLD, COPD, OSA, GERD, hypothyroidism, chronic LBP, R THA 2006, L THA 2009, previous back surgery  PAIN:  Are you having pain? Yes: NPRS scale: 4-5/10 Pain location: L "butt bone" pointing to L hip   Pain description: ache Aggravating factors: standing and walking > 30 min  Relieving factors: sitting in recliner  PRECAUTIONS: Fall and Other: has ostomy  RED FLAGS: None   WEIGHT BEARING RESTRICTIONS: No  FALLS:  Has patient fallen in last 6 months? Yes. Number of falls 1 - fell backwards in chair and tripped on R foot, weakness in R ankle DF  LIVING ENVIRONMENT: Lives with: lives with their spouse Lives in:  House/apartment Stairs: Yes: External: 6 steps; on left going up 2nd floor bonus room but does not go up those stairs, living area on 1st floor, 6 steps to garage Has following equipment at home: Shower bench, Grab bars, and scooter  OCCUPATION: retired from post office  PLOF: Independent and Leisure: swimming  PATIENT GOALS: return to swimming, be able to walk without pain, walk on treadmill, and bowl again  NEXT MD VISIT: in 5 months  OBJECTIVE:   DIAGNOSTIC FINDINGS:  11/06/22 XR Lumbar spine IMPRESSION:  1. L2-L5 posterior lumbar fusion with instrumentation and anterior  lumbar discectomy. Stable alignment.   PATIENT SURVEYS:  Modified Oswestry 22/50   COGNITION: Overall cognitive status: Within functional limits for tasks assessed     SENSATION: WFL  MUSCLE LENGTH: Hamstrings: significant tightness bilaterally  POSTURE: decreased lumbar lordosis, maintains 20 deg forward hip flexion  PALPATION: Diffuse tenderness low back   LUMBAR ROM:  lumbar AROM limited by fusion, no pain with flexion, unable to extend to neutral due to pain.   LOWER EXTREMITY ROM:   significant tightness bil hips R >L for both IR and ER.    LOWER EXTREMITY MMT:    MMT Right* eval Left* eval Right* 06/28/23  Hip flexion 4 5 4+  Hip extension 3 3   Hip abduction 4 5 4   Hip adduction 4+ 5 5  Knee flexion 5 5 5   Knee extension 5 5 5   Ankle dorsiflexion 3 5 3+  Ankle plantarflexion 4 5 4+   (Blank rows = not tested) * tested in sitting  LUMBAR SPECIAL TESTS:  NA   FUNCTIONAL TESTS:  5 times sit to stand: 15.6 sec    - 240'   Gait speed 0.6 m/s GAIT: Distance walked: 240' Assistive device utilized: None Level of assistance: Complete Independence Comments: wide BOS, forward flexed posture, visually slow     TODAY'S TREATMENT:                                                                                                                              DATE: 07/25/23  Pt  seen for aquatic therapy today.  Treatment took place in water 3.5-4.75 ft in depth at the [facility] pool. Temp of water was 91.  Pt entered/exited the pool via stairs independently with bilat rail.  - submerged 4.8 ft unsupported: walking forward/ backwards  - hip flex stretch at 2nd step - Old man stretch -solid noodle press for TrA sets in wide stance then staggered x 1-10 -plank on bench: fire hydrant -seated rest- mountain climbers - bow & arrow x 10 -  yellow HB push downs  Pt requires the buoyancy and hydrostatic pressure of water for support, and to offload joints by unweighting joint load by at least 50 % in navel deep water and by at least 75-80% in chest to neck deep water.  Viscosity of the water is needed for resistance of strengthening. Water  current perturbations provides challenge to standing balance requiring increased core activation.   07/24/2023 Therapeutic Exercise: to improve strength and mobility.  Demo, verbal and tactile cues throughout for technique. Sciatic nerve glide seated Pelvic tilts seated Standing hip flexor stretches Treadmill 1.0 mph, 2 incline, x 7 min  Hip extension GTB x 20 R/L Glut med isometric standing ball press against wall 3 x 10 sec hold  Therapeutic Activity:  to progress towards functional activities like bowling Suitcase carry with 10# x 90' each side Swings with kettle ball 5# - unilateral x 10 each side, in center x 10 - cues to activate glutes but primarily using UE to raise  Manual Therapy: to decrease muscle spasm and pain and improve mobility IASTM with percussive device to L piriformis, STM/TPR to L glut med/piriformis, skilled palpation and monitoring during dry needling. Trigger Point Dry Needling  Subsequent Treatment: Instructions provided previously at initial dry needling treatment.  Instructions reviewed, if requested by the patient, prior to subsequent dry needling treatment.   Patient Verbal Consent Given:  Yes Education Handout Provided: Previously Provided Muscles Treated: L piriformis, L glut med Electrical Stimulation Performed: No Treatment Response/Outcome: Twitch Response Elicited and Palpable Increase in Muscle Length    07/11/23  Therapeutic Exercise: to improve strength and mobility.  Demo, verbal and tactile cues throughout for technique. Treadmill Incline 2, 1.0 mph x 6 min  Hip extensions 2 x 15 bil - bent over with arms stabilized on physioball to engage core, SBA for safety Leg Press 25# x 20, single leg press 25# x 10 R/L, Ankle press 25# x 15 bil  Hamstring curls 20# x 20 Leg extensions 20# x 20  Lat pull down 25# x 10 standing (SBA needed for safety, unsteady) x 10 seated  Manual Therapy:  IASTM with Percussive device to L glute to decrease pain/spasm     07/08/2023 Pt seen for aquatic therapy today.  Treatment took place in water 3.5-4.75 ft in depth at the [facility] pool. Temp of water was 91.  Pt entered/exited the pool via stairs independently with bilat rail.  - submerged 70, unsupported: walking forward/ backwards  - L stretch - farmer carry with rainbow hand floats-> yellow HB at side forward, back and side stepping. Cues for core engagement - side stepping with arm addct -> with arm addct with rainbow hand floats  - plank position with hands on bench in water (yellow noodle under arms) x 10 LE hip ext each; fire hydrants and mountain climbers x 5 ea -hip hiking bottom step 2 x 8.  Some discomfort left buttock with descending of left hip -  yellow HB push downs - staggered stance with bilat horiz abdct/ addct with rainbow hand floats x 5 each LE forward - bow & arrow x 10 - hip flex and quad stretch on 2nd step (good stretch)  Pt requires the buoyancy and hydrostatic pressure of water for support, and to offload joints by unweighting joint load by at least 50 % in navel deep water and by at least 75-80% in chest to neck deep water.  Viscosity of  the water is needed for resistance of strengthening. Water current perturbations provides challenge to standing balance requiring increased core activation.   06/28/23 Tests & Measures DGI MMT Modified Oswestry Gait Speed Therapeutic Exercise: to improve strength and mobility.  Demo, verbal and tactile cues throughout for technique. Treadmill Incline 2 speed 0.9 x 6 min for warm-up & subjective Self Care: Discussion of sleep  positioning while positioned in supine with HOB elevated 15 deg and bolster under knees, importance of prioritizing sleep in bed instead of recliner for hip flexor stretching and cervical positioning.      PATIENT EDUCATION:  Education details: continue HEP as tolerated Person educated: Patient Education method: Explanation, Demonstration, and Handouts Education comprehension: verbalized understanding and returned demonstration  HOME EXERCISE PROGRAM: Access Code: [email or web address] URL: [email or web address] Date: 05/27/2023 Prepared by: Harrie Foreman  Exercises - Supine Hip Internal and External Rotation  - 1 x daily - 7 x weekly - 1 sets - 10 reps - Standing Hip Extension with Counter Support  - 1 x daily - 7 x weekly - 2-3 sets - 10 reps - modified to bent over on counter.  - Standing Lumbar Extension with Counter  - 1 x daily - 7 x weekly - 1 sets - 10 reps - Modified Thomas Stretch  - 1 x daily - 7 x weekly - 1 sets - 3 reps - 1 min hold  ASSESSMENT:  CLINICAL IMPRESSION: Pt with increased left piriformis pain.  Some relief gained from DN yesterday but new reports of left foot numbness. Attempted piriformis stretch although due to pts limited hip movement he is unable to gain proper position.  Focus today on stretching hip flex and strengthening posterior core.  Minimal toleration due to lle discomfort. Goals ongoing   Re-cert RUDDY SWIRE is making good progress towards goals but reports increasing L gluteal/piriformis pain with  increased activity now.   Continues to have forward flexed posture, with poor glute activation, noted reporting more pain with activities isolating L glute medius especially.  Did not report any back pain with swinging kettle bells but primarily using arms to swing.  With palpation noted trigger points in L glut med and L piriformis, after  review of DN rational, procedures, outcomes and potential side effects, patient verbalized consent to DN treatment in conjunction with manual STM/DTM and TPR to reduce ttp/muscle tension. Muscles treated as indicated above. DN produced normal response with good twitches elicited resulting in palpable reduction in pain/ttp and muscle tension.  Arles Rumbold [facility] [place] continues to demonstrate potential for improvement and would benefit from  extension of skilled therapy for additional 2x/week for 4 more weeks to address impairments and resolve L sided gluteal pain.       OBJECTIVE IMPAIRMENTS: Abnormal gait, decreased activity tolerance, decreased balance, decreased endurance, decreased mobility, difficulty walking, decreased ROM, decreased strength, impaired perceived functional ability, increased muscle spasms, impaired flexibility, impaired sensation, postural dysfunction, and pain.   ACTIVITY LIMITATIONS: carrying, lifting, bending, standing, squatting, sleeping, stairs, transfers, bed mobility, and locomotion level  PARTICIPATION LIMITATIONS: meal prep, cleaning, laundry, driving, shopping, community activity, and occupation  PERSONAL FACTORS: Time since onset of injury/illness/exacerbation and 3+ comorbidities: T2DM, osteoarthritis, history DVT, chronic anticoagulation, prostate cancer, HTN, HLD, COPD, OSA, GERD, hypothyroidism, chronic LBP, R THA 2006, L THA 2009, previous back surgery  are also affecting patient's functional outcome.   REHAB POTENTIAL: Good  CLINICAL DECISION MAKING: Evolving/moderate complexity  EVALUATION COMPLEXITY: Moderate   GOALS: Goals  reviewed with patient? Yes  SHORT TERM GOALS: Target date: 06/06/2023   Patient will be independent with initial HEP.  Baseline:  Goal status: MET   LONG TERM GOALS: Target date: 07/18/2023 extended to 08/21/23  Patient will be independent with advanced/ongoing HEP to improve outcomes and carryover.  Baseline:  Goal status: IN PROGRESS 06/28/23- met for current  2.  Patient will report 75% improvement in  low back pain to improve QOL.  Baseline: 5/10 Goal status: MET 06/28/23- back pain has resolved, just having pain in L buttock now.   3.  Patient will be able to stand erect without increased low back pain.    Baseline: stand with flexed posture maintaining ~ 20 deg hip flexion.  Goal status: IN PROGRESS 06/28/23- 15 deg hip flexion in standing  07/24/23 still flexed at hips ~ 10 deg  4.  Patient will demonstrate improved functional strength as demonstrated by 4+/5 RLE strength. Baseline: see objective Goal status: IN PROGRESS 06/28/23- improving strength overall, see objective  5.  Patient will report at least 6 points improvement on modified Oswestry to demonstrate improved functional ability.  Baseline: 22/50 Goal status: MET 06/28/23 11/50   6.  Patient will tolerate 15 min of treadmill walking to start exercising at gym. Baseline: unable Goal status: IN PROGRESS 06/06/23 - 7 min on treadmill 07/24/23- 7 min on treadmill, limited by L gluteal pain  7.   Patient will tolerate laying flat either supine or prone in order to return to massage therapy for pain relief.  Baseline: unable to lay in prone or supine Goal status: MET 06/06/23- can lay prone over 4 pillows 06/28/23- can lay flatter with HOB raised 15 deg and bolster under knees.  07/24/23- still needs pillows or HOB raised, but was able to get massage successfully.   8. Patient will increase gait speed to 1.0 m/s to improve community access.  Baseline: 0.6 m/s Goal status: IN PROGRESS 07/24/23- typical speed still 0.6 m/s, fast speed  0.8 m/s    9.  Patient will be able to lift and swing 8-10 lbs without LBP in order to return to bowling.   Baseline: unable Goal status: IN PROGRESS 07/24/23- able to carry 10#, swing 5# without pain  10.   Patient will score at least 19/24 on DGI to demonstrate lower risk of falls.  Baseline: NT Goal status: MET 06/28/23 19/24  11.  Patient will be able to complete activities without limitation from L gluteal pain Baseline: 4-5/10 L sciatic symptoms, worsening with standing/walking activities.  Goal status: INITIAL  PLAN:  PT FREQUENCY: 2x/week  PT DURATION: 4 weeks  PLANNED INTERVENTIONS: 97110-Therapeutic exercises, 97530- Therapeutic activity, 97112- Neuromuscular re-education, 97535- Self Care, 40981- Manual therapy, [phone]- Gait training, [phone]- Aquatic Therapy, 97014- Electrical stimulation (unattended), [phone]- Electrical stimulation (manual), Patient/Family education, Balance training, Stair training, Taping, Joint mobilization, Joint manipulation, Spinal manipulation, Spinal mobilization, Scar mobilization, Cryotherapy, and Moist heat.  PLAN FOR NEXT SESSION: continue core and extensor strengthening.  Aquatic therapy.  Manual/modalities PRN for L piriformis/gluteal pain.    Corrie [place] Natara [place] Jane) Ryley Bachtel MPT 07/25/23 11:51 AM [facility] [address] [place], [place], [address] Phone: [phone]   Fax:  [phone]

## 2023-07-29 ENCOUNTER — Ambulatory Visit (HOSPITAL_BASED_OUTPATIENT_CLINIC_OR_DEPARTMENT_OTHER): Payer: Medicare PPO | Admitting: Physical Therapy

## 2023-07-29 ENCOUNTER — Encounter (HOSPITAL_BASED_OUTPATIENT_CLINIC_OR_DEPARTMENT_OTHER): Payer: Self-pay | Admitting: Physical Therapy

## 2023-07-29 DIAGNOSIS — M6281 Muscle weakness (generalized): Secondary | ICD-10-CM

## 2023-07-29 DIAGNOSIS — R262 Difficulty in walking, not elsewhere classified: Secondary | ICD-10-CM

## 2023-07-29 DIAGNOSIS — R293 Abnormal posture: Secondary | ICD-10-CM

## 2023-07-29 DIAGNOSIS — M5459 Other low back pain: Secondary | ICD-10-CM | POA: Diagnosis not present

## 2023-07-29 NOTE — Therapy (Signed)
 OUTPATIENT PHYSICAL THERAPY THORACOLUMBAR TREATMENT  Patient Name: Kyle Delacruz MRN: [id number] DOB:07-26-1955, 68 y.o., male Today's Date: 07/29/2023  END OF SESSION:  PT End of Session - 07/29/23 1152     Visit Number 15    Date for PT Re-Evaluation 08/21/23    Authorization Type [employer] MCR + BCBS  BCBS VL 50)    Progress Note Due on Visit 19    PT Start Time 1145    PT Stop Time 1223    PT Time Calculation (min) 38 min    Behavior During Therapy WFL for tasks assessed/performed                Past Medical History:  Diagnosis Date   Allergy    Arthritis    osteo   CTS (carpal tunnel syndrome)    DJD (degenerative joint disease)    DM2 (diabetes mellitus, type 2) (HCC)    DVT (deep venous thrombosis) (HCC)    GERD (gastroesophageal reflux disease)    Gout    History of adenomatous polyps of colon 12/29/2019   History of nuclear stress test    Myoview 11/17: EF 54, no ST changes, hypertensive blood pressure response, no ischemia, low risk   Hyperlipidemia    Hypothyroidism    Nicotine addiction    OSA (obstructive sleep apnea)    Prostatitis    Reactive airway disease    Sarcocystosis    Sinus arrhythmia    Sleep apnea    cpap- not wearing   Tinea corporis    Past Surgical History:  Procedure Laterality Date   BILATERAL KNEE ARTHROSCOPY     right- 1997; left 2001   COLONOSCOPY  multiple last 2013   IVC FILTER INSERTION N/A 09/17/2022   Procedure: IVC FILTER INSERTION;  Surgeon: Maeola Harman, MD;  Location: [facility] INVASIVE CV LAB;  Service: Cardiovascular;  Laterality: N/A;   IVC FILTER REMOVAL N/A 01/28/2023   Procedure: IVC FILTER REMOVAL;  Surgeon: Maeola Harman, MD;  Location: [facility] INVASIVE CV LAB;  Service: Cardiovascular;  Laterality: N/A;   KNEE SURGERY Right 2014   torn R medial meniscus- arthroscopy done   LAPAROTOMY N/A 04/10/2022   Procedure: EXPLORATORY LAPAROTOMY, TOTAL COLECTOMY, END ILEOSTOMY;  Surgeon:  Quentin Ore, MD;  Location: [facility];  Service: General;  Laterality: N/A;   PARATHYROIDECTOMY  2005   ROTATOR CUFF REPAIR  2004   right   TONSILLECTOMY     TOTAL HIP ARTHROPLASTY Left 2009   TOTAL HIP ARTHROPLASTY Right 2006   UPPER GASTROINTESTINAL ENDOSCOPY     Patient Active Problem List   Diagnosis Date Noted   History of pulmonary embolism 06/28/2022   History of DVT (deep vein thrombosis) 06/28/2022   Lumbar radiculopathy, chronic 06/28/2022   Nausea and vomiting 06/28/2022   Septic shock (HCC) 06/26/2022   SVT (supraventricular tachycardia) (HCC) 06/19/2022   PAF (paroxysmal atrial fibrillation) (HCC) 06/18/2022   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   High output ileostomy (HCC) 04/21/2022   Chronic anticoagulation 04/21/2022   Normocytic anemia 04/10/2022   Shock (HCC) 04/10/2022   Deep vein thrombosis (DVT) of femoral vein of right lower extremity (HCC) 04/09/2022   Leg DVT (deep venous thromboembolism), chronic, right (HCC) 04/08/2022   Abdominal distension 04/08/2022   Acute respiratory failure with hypoxia (HCC) 07/27/2021   PE (pulmonary thromboembolism) (HCC) 07/26/2021   Carcinoma of prostate (HCC) 07/10/2021   Chronic obstructive pulmonary disease (HCC) 07/10/2021   HLD (hyperlipidemia) 03/10/2021   Hypothyroidism 03/10/2021  Chronic bilateral back pain 03/10/2021   History of adenomatous polyps of colon 12/29/2019   Essential hypertension 06/13/2011   Obstructive sleep apnea (adult) (pediatric) 06/13/2011   Gastro-esophageal reflux disease without esophagitis 05/02/2009    PCP: Chilton Greathouse, MD   REFERRING PROVIDER: Nadara Mode PA-C  REFERRING DIAG: [phone] (ICD-10-CM) - Fusion of spine of lumbar region   Rationale for Evaluation and Treatment: Rehabilitation  THERAPY DIAG:  Other low back pain  Muscle weakness (generalized)  Abnormal posture  Difficulty in walking, not elsewhere classified  ONSET DATE: Back surgery 04/04/22, PE  04/08/22, 06/26/22 Sepsis, 11/05/22 Second back surgery to remove hardware and remove IVC filter  SUBJECTIVE:                                                                                                                                                                                           SUBJECTIVE STATEMENT: Pt reports the hip flexor stretch last session hurt.  He reports he has had "pain in the butt" for 1.5 wks without relief. He reports that he had trouble getting up from seat at dinner on Friday because hip hurt so bad. He has not been to the [employer] pool yet.   POOL ACCESS:  Pt has membership to [facility] with silver sneakers. Pt reports he hasn't been to pool since fall 2024.   FROM LK:GMWN said I need to strengthen my back muscles cause I can't stand up and walk like I want to, I know I can take a fall now, I was sitting around a fire circle and flipped back in a chair, got an x-ray and everything was good, didn't loosen the hardware, bone is healing though everything.  My neck has been a little sore from that too. Has had a lot of surgeries starting with original back surgery on 04/04/22, developed PE on 04/08/22, then developed sepsis on 06/26/22, had increased back pain starting in March 2024, cage in back came loose, had hardware removed and IVC on 11/05/22.  Can't lay flat because sleeping in recliner which also makes neck sore.  Would like to get back to swimming too.   PERTINENT HISTORY:  T2DM, osteoarthritis, history DVT, chronic anticoagulation, prostate cancer, HTN, HLD, COPD, OSA, GERD, hypothyroidism, chronic LBP, R THA 2006, L THA 2009, previous back surgery  PAIN:  Are you having pain? Yes: NPRS scale: 4-5/10 Pain location: L "butt bone" pointing to L hip and groin   Pain description: ache Aggravating factors: standing and walking > 30 min  Relieving factors: sitting in recliner  PRECAUTIONS: Fall and Other: has ostomy  RED FLAGS: None   WEIGHT BEARING RESTRICTIONS:  No  FALLS:  Has patient fallen in last 6 months? Yes. Number of falls 1 - fell backwards in chair and tripped on R foot, weakness in R ankle DF  LIVING ENVIRONMENT: Lives with: lives with their spouse Lives in: House/apartment Stairs: Yes: External: 6 steps; on left going up 2nd floor bonus room but does not go up those stairs, living area on 1st floor, 6 steps to garage Has following equipment at home: Tour manager, Grab bars, and scooter  OCCUPATION: retired from post office  PLOF: Independent and Leisure: swimming  PATIENT GOALS: return to swimming, be able to walk without pain, walk on treadmill, and bowl again  NEXT MD VISIT: in 5 months  OBJECTIVE:   DIAGNOSTIC FINDINGS:  11/06/22 XR Lumbar spine IMPRESSION:  1. L2-L5 posterior lumbar fusion with instrumentation and anterior  lumbar discectomy. Stable alignment.   PATIENT SURVEYS:  Modified Oswestry 22/50   COGNITION: Overall cognitive status: Within functional limits for tasks assessed     SENSATION: WFL  MUSCLE LENGTH: Hamstrings: significant tightness bilaterally  POSTURE: decreased lumbar lordosis, maintains 20 deg forward hip flexion  PALPATION: Diffuse tenderness low back   LUMBAR ROM:  lumbar AROM limited by fusion, no pain with flexion, unable to extend to neutral due to pain.   LOWER EXTREMITY ROM:   significant tightness bil hips R >L for both IR and ER.    LOWER EXTREMITY MMT:    MMT Right* eval Left* eval Right* 06/28/23  Hip flexion 4 5 4+  Hip extension 3 3   Hip abduction 4 5 4   Hip adduction 4+ 5 5  Knee flexion 5 5 5   Knee extension 5 5 5   Ankle dorsiflexion 3 5 3+  Ankle plantarflexion 4 5 4+   (Blank rows = not tested) * tested in sitting  LUMBAR SPECIAL TESTS:  NA   FUNCTIONAL TESTS:  5 times sit to stand: 15.6 sec    - 240'   Gait speed 0.6 m/s GAIT: Distance walked: 240' Assistive device utilized: None Level of assistance: Complete Independence Comments: wide  BOS, forward flexed posture, visually slow     TODAY'S TREATMENT:                                                                                                                              DATE: 07/29/23  Pt seen for aquatic therapy today.  Treatment took place in water 3.5-4.75 ft in depth at the [facility] pool. Temp of water was 91.  Pt entered/exited the pool via stairs independently with bilat rail.  - submerged 4.8 ft unsupported: walking forward/ backwards multiple laps - side stepping R/L -> with arm addct/ abdct with rainbow hand floats x 4 laps - UE on wall: hip circles x 8 R/L  (for hip flexor and adductor stretch); hip abdct/ addct 2 x 5; L hip ext to toe touch x 10; single leg clam x 8 each  -  return to walking forward/ backward  -hollow noodle press for TrA sets in wide stance then staggered x 10 - staggered stance with single arm swing with light resistance bell x 10 each UE; then walking and swing (like bowling) - bow & arrow with step back (yellow hand floats) x 10 -  yellow HB push downs  DATE: 07/25/23  Pt seen for aquatic therapy today.  Treatment took place in water 3.5-4.75 ft in depth at the [facility] pool. Temp of water was 91.  Pt entered/exited the pool via stairs independently with bilat rail.  - submerged 4.8 ft unsupported: walking forward/ backwards  - hip flex stretch at 2nd step - Old man stretch -solid noodle press for TrA sets in wide stance then staggered x 1-10 -plank on bench: fire hydrant -seated rest- mountain climbers - bow & arrow x 10 -  yellow HB push downs  Pt requires the buoyancy and hydrostatic pressure of water for support, and to offload joints by unweighting joint load by at least 50 % in navel deep water and by at least 75-80% in chest to neck deep water.  Viscosity of the water is needed for resistance of strengthening. Water current perturbations provides challenge to standing balance requiring increased core  activation.   07/24/2023 Therapeutic Exercise: to improve strength and mobility.  Demo, verbal and tactile cues throughout for technique. Sciatic nerve glide seated Pelvic tilts seated Standing hip flexor stretches Treadmill 1.0 mph, 2 incline, x 7 min  Hip extension GTB x 20 R/L Glut med isometric standing ball press against wall 3 x 10 sec hold  Therapeutic Activity:  to progress towards functional activities like bowling Suitcase carry with 10# x 90' each side Swings with kettle ball 5# - unilateral x 10 each side, in center x 10 - cues to activate glutes but primarily using UE to raise  Manual Therapy: to decrease muscle spasm and pain and improve mobility IASTM with percussive device to L piriformis, STM/TPR to L glut med/piriformis, skilled palpation and monitoring during dry needling. Trigger Point Dry Needling  Subsequent Treatment: Instructions provided previously at initial dry needling treatment.  Instructions reviewed, if requested by the patient, prior to subsequent dry needling treatment.   Patient Verbal Consent Given: Yes Education Handout Provided: Previously Provided Muscles Treated: L piriformis, L glut med Electrical Stimulation Performed: No Treatment Response/Outcome: Twitch Response Elicited and Palpable Increase in Muscle Length    07/11/23  Therapeutic Exercise: to improve strength and mobility.  Demo, verbal and tactile cues throughout for technique. Treadmill Incline 2, 1.0 mph x 6 min  Hip extensions 2 x 15 bil - bent over with arms stabilized on physioball to engage core, SBA for safety Leg Press 25# x 20, single leg press 25# x 10 R/L, Ankle press 25# x 15 bil  Hamstring curls 20# x 20 Leg extensions 20# x 20  Lat pull down 25# x 10 standing (SBA needed for safety, unsteady) x 10 seated  Manual Therapy:  IASTM with Percussive device to L glute to decrease pain/spasm     07/08/2023 Pt seen for aquatic therapy today.  Treatment took place in  water 3.5-4.75 ft in depth at the [facility] pool. Temp of water was 91.  Pt entered/exited the pool via stairs independently with bilat rail.  - submerged 70, unsupported: walking forward/ backwards  - L stretch - farmer carry with rainbow hand floats-> yellow HB at side forward, back and side stepping. Cues for core engagement -  side stepping with arm addct -> with arm addct with rainbow hand floats  - plank position with hands on bench in water (yellow noodle under arms) x 10 LE hip ext each; fire hydrants and mountain climbers x 5 ea -hip hiking bottom step 2 x 8.  Some discomfort left buttock with descending of left hip -  yellow HB push downs - staggered stance with bilat horiz abdct/ addct with rainbow hand floats x 5 each LE forward - bow & arrow x 10 - hip flex and quad stretch on 2nd step (good stretch)  Pt requires the buoyancy and hydrostatic pressure of water for support, and to offload joints by unweighting joint load by at least 50 % in navel deep water and by at least 75-80% in chest to neck deep water.  Viscosity of the water is needed for resistance of strengthening. Water current perturbations provides challenge to standing balance requiring increased core activation.   06/28/23 Tests & Measures DGI MMT Modified Oswestry Gait Speed Therapeutic Exercise: to improve strength and mobility.  Demo, verbal and tactile cues throughout for technique. Treadmill Incline 2 speed 0.9 x 6 min for warm-up & subjective Self Care: Discussion of sleep positioning while positioned in supine with HOB elevated 15 deg and bolster under knees, importance of prioritizing sleep in bed instead of recliner for hip flexor stretching and cervical positioning.      PATIENT EDUCATION:  Education details: continue HEP as tolerated Person educated: Patient Education method: Explanation, Demonstration, and Handouts Education comprehension: verbalized understanding and returned  demonstration  HOME EXERCISE PROGRAM: Access Code: [email or web address] URL: [email or web address] Date: 05/27/2023 Prepared by: Harrie Foreman  Exercises - Supine Hip Internal and External Rotation  - 1 x daily - 7 x weekly - 1 sets - 10 reps - Standing Hip Extension with Counter Support  - 1 x daily - 7 x weekly - 2-3 sets - 10 reps - modified to bent over on counter.  - Standing Lumbar Extension with Counter  - 1 x daily - 7 x weekly - 1 sets - 10 reps - Modified Thomas Stretch  - 1 x daily - 7 x weekly - 1 sets - 3 reps - 1 min hold  ASSESSMENT:  CLINICAL IMPRESSION: Pt continues with increased pain in Lt buttock.  He reported gradual reduction of pain in Lt hip (groin/buttocks) by 1-2 points.  Will plan to begin creating aquatic HEP and instructing on it, to prepare for d/c to community pool.  Pt is making gradual progress towards remaining goals.    Re-cert ELMOND POEHLMAN is making good progress towards goals but reports increasing L gluteal/piriformis pain with increased activity now.   Continues to have forward flexed posture, with poor glute activation, noted reporting more pain with activities isolating L glute medius especially.  Did not report any back pain with swinging kettle bells but primarily using arms to swing.  With palpation noted trigger points in L glut med and L piriformis, after  review of DN rational, procedures, outcomes and potential side effects, patient verbalized consent to DN treatment in conjunction with manual STM/DTM and TPR to reduce ttp/muscle tension. Muscles treated as indicated above. DN produced normal response with good twitches elicited resulting in palpable reduction in pain/ttp and muscle tension.  Raoul Ciano [facility] continues to demonstrate potential for improvement and would benefit from  extension of skilled therapy for additional 2x/week for 4 more weeks to address impairments and resolve L sided gluteal pain.  OBJECTIVE IMPAIRMENTS:  Abnormal gait, decreased activity tolerance, decreased balance, decreased endurance, decreased mobility, difficulty walking, decreased ROM, decreased strength, impaired perceived functional ability, increased muscle spasms, impaired flexibility, impaired sensation, postural dysfunction, and pain.   ACTIVITY LIMITATIONS: carrying, lifting, bending, standing, squatting, sleeping, stairs, transfers, bed mobility, and locomotion level  PARTICIPATION LIMITATIONS: meal prep, cleaning, laundry, driving, shopping, community activity, and occupation  PERSONAL FACTORS: Time since onset of injury/illness/exacerbation and 3+ comorbidities: T2DM, osteoarthritis, history DVT, chronic anticoagulation, prostate cancer, HTN, HLD, COPD, OSA, GERD, hypothyroidism, chronic LBP, R THA 2006, L THA 2009, previous back surgery  are also affecting patient's functional outcome.   REHAB POTENTIAL: Good  CLINICAL DECISION MAKING: Evolving/moderate complexity  EVALUATION COMPLEXITY: Moderate   GOALS: Goals reviewed with patient? Yes  SHORT TERM GOALS: Target date: 06/06/2023   Patient will be independent with initial HEP.  Baseline:  Goal status: MET   LONG TERM GOALS: Target date: 07/18/2023 extended to 08/21/23  Patient will be independent with advanced/ongoing HEP to improve outcomes and carryover.  Baseline:  Goal status: IN PROGRESS 06/28/23- met for current  2.  Patient will report 75% improvement in low back pain to improve QOL.  Baseline: 5/10 Goal status: MET 06/28/23- back pain has resolved, just having pain in L buttock now.   3.  Patient will be able to stand erect without increased low back pain.    Baseline: stand with flexed posture maintaining ~ 20 deg hip flexion.  Goal status: IN PROGRESS 06/28/23- 15 deg hip flexion in standing  07/24/23 still flexed at hips ~ 10 deg  4.  Patient will demonstrate improved functional strength as demonstrated by 4+/5 RLE strength. Baseline: see objective Goal  status: IN PROGRESS 06/28/23- improving strength overall, see objective  5.  Patient will report at least 6 points improvement on modified Oswestry to demonstrate improved functional ability.  Baseline: 22/50 Goal status: MET 06/28/23 11/50   6.  Patient will tolerate 15 min of treadmill walking to start exercising at gym. Baseline: unable Goal status: IN PROGRESS 06/06/23 - 7 min on treadmill 07/24/23- 7 min on treadmill, limited by L gluteal pain  7.   Patient will tolerate laying flat either supine or prone in order to return to massage therapy for pain relief.  Baseline: unable to lay in prone or supine Goal status: MET 06/06/23- can lay prone over 4 pillows 06/28/23- can lay flatter with HOB raised 15 deg and bolster under knees.  07/24/23- still needs pillows or HOB raised, but was able to get massage successfully.   8. Patient will increase gait speed to 1.0 m/s to improve community access.  Baseline: 0.6 m/s Goal status: IN PROGRESS 07/24/23- typical speed still 0.6 m/s, fast speed 0.8 m/s    9.  Patient will be able to lift and swing 8-10 lbs without LBP in order to return to bowling.   Baseline: unable Goal status: IN PROGRESS 07/24/23- able to carry 10#, swing 5# without pain  10.   Patient will score at least 19/24 on DGI to demonstrate lower risk of falls.  Baseline: NT Goal status: MET 06/28/23 19/24  11.  Patient will be able to complete activities without limitation from L gluteal pain Baseline: 4-5/10 L sciatic symptoms, worsening with standing/walking activities.  Goal status: INITIAL  PLAN:  PT FREQUENCY: 2x/week  PT DURATION: 4 weeks  PLANNED INTERVENTIONS: 97110-Therapeutic exercises, 97530- Therapeutic activity, [id number]- Neuromuscular re-education, 97535- Self Care, [id number]- Manual therapy, [id number]- Gait training, [id number]- Aquatic Therapy, [id number]-  Electrical stimulation (unattended), [phone]- Electrical stimulation (manual), Patient/Family education, Balance training, Stair  training, Taping, Joint mobilization, Joint manipulation, Spinal manipulation, Spinal mobilization, Scar mobilization, Cryotherapy, and Moist heat.  PLAN FOR NEXT SESSION: continue core and extensor strengthening.  Aquatic therapy.  Manual/modalities PRN for L piriformis/gluteal pain.    Mayer Camel, PTA 07/29/23 12:33 PM [facility] [address] [place], [place], [address] Phone: [phone]   Fax:  [phone]

## 2023-07-31 ENCOUNTER — Ambulatory Visit: Payer: Medicare PPO | Admitting: Pulmonary Disease

## 2023-08-01 ENCOUNTER — Ambulatory Visit (HOSPITAL_BASED_OUTPATIENT_CLINIC_OR_DEPARTMENT_OTHER): Payer: Medicare PPO | Admitting: Physical Therapy

## 2023-08-01 ENCOUNTER — Encounter (HOSPITAL_BASED_OUTPATIENT_CLINIC_OR_DEPARTMENT_OTHER): Payer: Self-pay | Admitting: Physical Therapy

## 2023-08-01 DIAGNOSIS — M5459 Other low back pain: Secondary | ICD-10-CM | POA: Diagnosis not present

## 2023-08-01 DIAGNOSIS — R293 Abnormal posture: Secondary | ICD-10-CM

## 2023-08-01 DIAGNOSIS — M6281 Muscle weakness (generalized): Secondary | ICD-10-CM

## 2023-08-01 DIAGNOSIS — R262 Difficulty in walking, not elsewhere classified: Secondary | ICD-10-CM

## 2023-08-01 NOTE — Therapy (Signed)
 OUTPATIENT PHYSICAL THERAPY THORACOLUMBAR TREATMENT  Patient Name: Kyle Delacruz MRN: [id number] DOB:July 29, 1955, 68 y.o., male Today's Date: 08/01/2023  END OF SESSION:  PT End of Session - 08/01/23 1145     Visit Number 16    Date for PT Re-Evaluation 08/21/23    Authorization Type [employer] MCR + BCBS  BCBS VL 50)    Progress Note Due on Visit 19    PT Start Time 1145    PT Stop Time 1225    PT Time Calculation (min) 40 min    Activity Tolerance Patient tolerated treatment well    Behavior During Therapy WFL for tasks assessed/performed                Past Medical History:  Diagnosis Date   Allergy    Arthritis    osteo   CTS (carpal tunnel syndrome)    DJD (degenerative joint disease)    DM2 (diabetes mellitus, type 2) (HCC)    DVT (deep venous thrombosis) (HCC)    GERD (gastroesophageal reflux disease)    Gout    History of adenomatous polyps of colon 12/29/2019   History of nuclear stress test    Myoview 11/17: EF 54, no ST changes, hypertensive blood pressure response, no ischemia, low risk   Hyperlipidemia    Hypothyroidism    Nicotine addiction    OSA (obstructive sleep apnea)    Prostatitis    Reactive airway disease    Sarcocystosis    Sinus arrhythmia    Sleep apnea    cpap- not wearing   Tinea corporis    Past Surgical History:  Procedure Laterality Date   BILATERAL KNEE ARTHROSCOPY     right- 1997; left 2001   COLONOSCOPY  multiple last 2013   IVC FILTER INSERTION N/A 09/17/2022   Procedure: IVC FILTER INSERTION;  Surgeon: Maeola Harman, MD;  Location: [facility] INVASIVE CV LAB;  Service: Cardiovascular;  Laterality: N/A;   IVC FILTER REMOVAL N/A 01/28/2023   Procedure: IVC FILTER REMOVAL;  Surgeon: Maeola Harman, MD;  Location: [facility] INVASIVE CV LAB;  Service: Cardiovascular;  Laterality: N/A;   KNEE SURGERY Right 2014   torn R medial meniscus- arthroscopy done   LAPAROTOMY N/A 04/10/2022   Procedure: EXPLORATORY  LAPAROTOMY, TOTAL COLECTOMY, END ILEOSTOMY;  Surgeon: Quentin Ore, MD;  Location: [facility];  Service: General;  Laterality: N/A;   PARATHYROIDECTOMY  2005   ROTATOR CUFF REPAIR  2004   right   TONSILLECTOMY     TOTAL HIP ARTHROPLASTY Left 2009   TOTAL HIP ARTHROPLASTY Right 2006   UPPER GASTROINTESTINAL ENDOSCOPY     Patient Active Problem List   Diagnosis Date Noted   History of pulmonary embolism 06/28/2022   History of DVT (deep vein thrombosis) 06/28/2022   Lumbar radiculopathy, chronic 06/28/2022   Nausea and vomiting 06/28/2022   Septic shock (HCC) 06/26/2022   SVT (supraventricular tachycardia) (HCC) 06/19/2022   PAF (paroxysmal atrial fibrillation) (HCC) 06/18/2022   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   High output ileostomy (HCC) 04/21/2022   Chronic anticoagulation 04/21/2022   Normocytic anemia 04/10/2022   Shock (HCC) 04/10/2022   Deep vein thrombosis (DVT) of femoral vein of right lower extremity (HCC) 04/09/2022   Leg DVT (deep venous thromboembolism), chronic, right (HCC) 04/08/2022   Abdominal distension 04/08/2022   Acute respiratory failure with hypoxia (HCC) 07/27/2021   PE (pulmonary thromboembolism) (HCC) 07/26/2021   Carcinoma of prostate (HCC) 07/10/2021   Chronic obstructive pulmonary disease (HCC) 07/10/2021  HLD (hyperlipidemia) 03/10/2021   Hypothyroidism 03/10/2021   Chronic bilateral back pain 03/10/2021   History of adenomatous polyps of colon 12/29/2019   Essential hypertension 06/13/2011   Obstructive sleep apnea (adult) (pediatric) 06/13/2011   Gastro-esophageal reflux disease without esophagitis 05/02/2009    PCP: Chilton Greathouse, MD   REFERRING PROVIDER: Nadara Mode PA-C  REFERRING DIAG: [phone] (ICD-10-CM) - Fusion of spine of lumbar region   Rationale for Evaluation and Treatment: Rehabilitation  THERAPY DIAG:  Other low back pain  Muscle weakness (generalized)  Abnormal posture  Difficulty in walking, not elsewhere  classified  ONSET DATE: Back surgery 04/04/22, PE 04/08/22, 06/26/22 Sepsis, 11/05/22 Second back surgery to remove hardware and remove IVC filter  SUBJECTIVE:                                                                                                                                                                                           SUBJECTIVE STATEMENT: Pt received injection in left hip bursa with some relief from pain.  States it was put into his back where he feels like he wanted it to go.  Ortho wants pt to see his back surgeon if it persists.  POOL ACCESS:  Pt has membership to [employer] with silver sneakers. Pt reports he hasn't been to pool since fall 2024.   FROM GM:WNUU said I need to strengthen my back muscles cause I can't stand up and walk like I want to, I know I can take a fall now, I was sitting around a fire circle and flipped back in a chair, got an x-ray and everything was good, didn't loosen the hardware, bone is healing though everything.  My neck has been a little sore from that too. Has had a lot of surgeries starting with original back surgery on 04/04/22, developed PE on 04/08/22, then developed sepsis on 06/26/22, had increased back pain starting in March 2024, cage in back came loose, had hardware removed and IVC on 11/05/22.  Can't lay flat because sleeping in recliner which also makes neck sore.  Would like to get back to swimming too.   PERTINENT HISTORY:  T2DM, osteoarthritis, history DVT, chronic anticoagulation, prostate cancer, HTN, HLD, COPD, OSA, GERD, hypothyroidism, chronic LBP, R THA 2006, L THA 2009, previous back surgery  PAIN:  Are you having pain? Yes: NPRS scale: 3/10 Pain location: L "butt bone" pointing to L hip and groin   Pain description: ache Aggravating factors: standing and walking > 30 min  Relieving factors: sitting in recliner  PRECAUTIONS: Fall and Other: has ostomy  RED FLAGS: None   WEIGHT BEARING RESTRICTIONS: No  FALLS:  Has  patient fallen in last 6 months? Yes. Number of falls 1 - fell backwards in chair and tripped on R foot, weakness in R ankle DF  LIVING ENVIRONMENT: Lives with: lives with their spouse Lives in: House/apartment Stairs: Yes: External: 6 steps; on left going up 2nd floor bonus room but does not go up those stairs, living area on 1st floor, 6 steps to garage Has following equipment at home: Tour manager, Grab bars, and scooter  OCCUPATION: retired from post office  PLOF: Independent and Leisure: swimming  PATIENT GOALS: return to swimming, be able to walk without pain, walk on treadmill, and bowl again  NEXT MD VISIT: in 5 months  OBJECTIVE:   DIAGNOSTIC FINDINGS:  11/06/22 XR Lumbar spine IMPRESSION:  1. L2-L5 posterior lumbar fusion with instrumentation and anterior  lumbar discectomy. Stable alignment.   PATIENT SURVEYS:  Modified Oswestry 22/50   COGNITION: Overall cognitive status: Within functional limits for tasks assessed     SENSATION: WFL  MUSCLE LENGTH: Hamstrings: significant tightness bilaterally  POSTURE: decreased lumbar lordosis, maintains 20 deg forward hip flexion  PALPATION: Diffuse tenderness low back   LUMBAR ROM:  lumbar AROM limited by fusion, no pain with flexion, unable to extend to neutral due to pain.   LOWER EXTREMITY ROM:   significant tightness bil hips R >L for both IR and ER.    LOWER EXTREMITY MMT:    MMT Right* eval Left* eval Right* 06/28/23  Hip flexion 4 5 4+  Hip extension 3 3   Hip abduction 4 5 4   Hip adduction 4+ 5 5  Knee flexion 5 5 5   Knee extension 5 5 5   Ankle dorsiflexion 3 5 3+  Ankle plantarflexion 4 5 4+   (Blank rows = not tested) * tested in sitting  LUMBAR SPECIAL TESTS:  NA   FUNCTIONAL TESTS:  5 times sit to stand: 15.6 sec    - 240'   Gait speed 0.6 m/s GAIT: Distance walked: 240' Assistive device utilized: None Level of assistance: Complete Independence Comments: wide BOS, forward flexed  posture, visually slow     TODAY'S TREATMENT:                                                                                                                              DATE: 08/01/23  Pt seen for aquatic therapy today.  Treatment took place in water 3.5-4.75 ft in depth at the [facility] pool. Temp of water was 91.  Pt entered/exited the pool via stairs independently with bilat rail.  - submerged 4.8 ft unsupported: walking forward/ backwards multiple laps - side stepping R/L -> with arm addct/ abdct with rainbow hand floats x 4 laps -Farmers carry yellow HB forward and back x 2 widths ea - UE on wall: hip circles x 18 R/L  (for hip flexor and adductor stretch) CW& CCW;  -decompression position with noodle wrapped posteriorly across chest cycle (good execution);  hip add/abd cues for  extended knees for post hip engagement (reports left  toe tingling which stops with discontinued activity) *resisted row using rider band 2 x 10  Pt requires the buoyancy and hydrostatic pressure of water for support, and to offload joints by unweighting joint load by at least 50 % in navel deep water and by at least 75-80% in chest to neck deep water.  Viscosity of the water is needed for resistance of strengthening. Water current perturbations provides challenge to standing balance requiring increased core activation.  DATE: 07/29/23  Pt seen for aquatic therapy today.  Treatment took place in water 3.5-4.75 ft in depth at the [facility] pool. Temp of water was 91.  Pt entered/exited the pool via stairs independently with bilat rail.  - submerged 4.8 ft unsupported: walking forward/ backwards multiple laps - side stepping R/L -> with arm addct/ abdct with rainbow hand floats x 4 laps - UE on wall: hip circles x 8 R/L  (for hip flexor and adductor stretch); hip abdct/ addct 2 x 5; L hip ext to toe touch x 10; single leg clam x 8 each  - return to walking forward/ backward  -hollow  noodle press for TrA sets in wide stance then staggered x 10 - staggered stance with single arm swing with light resistance bell x 10 each UE; then walking and swing (like bowling) - bow & arrow with step back (yellow hand floats) x 10 -  yellow HB push downs  DATE: 07/25/23  Pt seen for aquatic therapy today.  Treatment took place in water 3.5-4.75 ft in depth at the [facility] pool. Temp of water was 91.  Pt entered/exited the pool via stairs independently with bilat rail.  - submerged 4.8 ft unsupported: walking forward/ backwards  - hip flex stretch at 2nd step - Old man stretch -solid noodle press for TrA sets in wide stance then staggered x 1-10 -plank on bench: fire hydrant -seated rest- mountain climbers - bow & arrow x 10 -  yellow HB push downs  Pt requires the buoyancy and hydrostatic pressure of water for support, and to offload joints by unweighting joint load by at least 50 % in navel deep water and by at least 75-80% in chest to neck deep water.  Viscosity of the water is needed for resistance of strengthening. Water current perturbations provides challenge to standing balance requiring increased core activation.   07/24/2023 Therapeutic Exercise: to improve strength and mobility.  Demo, verbal and tactile cues throughout for technique. Sciatic nerve glide seated Pelvic tilts seated Standing hip flexor stretches Treadmill 1.0 mph, 2 incline, x 7 min  Hip extension GTB x 20 R/L Glut med isometric standing ball press against wall 3 x 10 sec hold  Therapeutic Activity:  to progress towards functional activities like bowling Suitcase carry with 10# x 90' each side Swings with kettle ball 5# - unilateral x 10 each side, in center x 10 - cues to activate glutes but primarily using UE to raise  Manual Therapy: to decrease muscle spasm and pain and improve mobility IASTM with percussive device to L piriformis, STM/TPR to L glut med/piriformis, skilled palpation and  monitoring during dry needling. Trigger Point Dry Needling  Subsequent Treatment: Instructions provided previously at initial dry needling treatment.  Instructions reviewed, if requested by the patient, prior to subsequent dry needling treatment.   Patient Verbal Consent Given: Yes Education Handout Provided: Previously Provided Muscles Treated: L piriformis, L glut med Electrical  Stimulation Performed: No Treatment Response/Outcome: Twitch Response Elicited and Palpable Increase in Muscle Length    07/11/23  Therapeutic Exercise: to improve strength and mobility.  Demo, verbal and tactile cues throughout for technique. Treadmill Incline 2, 1.0 mph x 6 min  Hip extensions 2 x 15 bil - bent over with arms stabilized on physioball to engage core, SBA for safety Leg Press 25# x 20, single leg press 25# x 10 R/L, Ankle press 25# x 15 bil  Hamstring curls 20# x 20 Leg extensions 20# x 20  Lat pull down 25# x 10 standing (SBA needed for safety, unsteady) x 10 seated  Manual Therapy:  IASTM with Percussive device to L glute to decrease pain/spasm     07/08/2023 Pt seen for aquatic therapy today.  Treatment took place in water 3.5-4.75 ft in depth at the [facility] pool. Temp of water was 91.  Pt entered/exited the pool via stairs independently with bilat rail.  - submerged 70, unsupported: walking forward/ backwards  - L stretch - farmer carry with rainbow hand floats-> yellow HB at side forward, back and side stepping. Cues for core engagement - side stepping with arm addct -> with arm addct with rainbow hand floats  - plank position with hands on bench in water (yellow noodle under arms) x 10 LE hip ext each; fire hydrants and mountain climbers x 5 ea -hip hiking bottom step 2 x 8.  Some discomfort left buttock with descending of left hip -  yellow HB push downs - staggered stance with bilat horiz abdct/ addct with rainbow hand floats x 5 each LE forward - bow & arrow x  10 - hip flex and quad stretch on 2nd step (good stretch)  Pt requires the buoyancy and hydrostatic pressure of water for support, and to offload joints by unweighting joint load by at least 50 % in navel deep water and by at least 75-80% in chest to neck deep water.  Viscosity of the water is needed for resistance of strengthening. Water current perturbations provides challenge to standing balance requiring increased core activation.   06/28/23 Tests & Measures DGI MMT Modified Oswestry Gait Speed Therapeutic Exercise: to improve strength and mobility.  Demo, verbal and tactile cues throughout for technique. Treadmill Incline 2 speed 0.9 x 6 min for warm-up & subjective Self Care: Discussion of sleep positioning while positioned in supine with HOB elevated 15 deg and bolster under knees, importance of prioritizing sleep in bed instead of recliner for hip flexor stretching and cervical positioning.      PATIENT EDUCATION:  Education details: continue HEP as tolerated Person educated: Patient Education method: Explanation, Demonstration, and Handouts Education comprehension: verbalized understanding and returned demonstration  HOME EXERCISE PROGRAM: Access Code: [email or web address] URL: [email or web address] Date: 05/27/2023 Prepared by: Harrie Foreman  Exercises - Supine Hip Internal and External Rotation  - 1 x daily - 7 x weekly - 1 sets - 10 reps - Standing Hip Extension with Counter Support  - 1 x daily - 7 x weekly - 2-3 sets - 10 reps - modified to bent over on counter.  - Standing Lumbar Extension with Counter  - 1 x daily - 7 x weekly - 1 sets - 10 reps - Modified Thomas Stretch  - 1 x daily - 7 x weekly - 1 sets - 3 reps - 1 min hold   AQUATIC This aquatic home exercise program from Med[employer] utilizes pictures from land based exercises, but has been adapted prior to  lamination and issuance.   Access Code: [email or web address] URL:  [email or web address] Date: 08/01/2023 Prepared by: Geni Bers  Exercises - Hand Buoy Carry  - 1 x daily - 7 x weekly - 3 sets - 10 reps - Drawing Bow  - 1 x daily - 7 x weekly - 3 sets - 10 reps - Standing Shoulder Horizontal Abduction with Resistance  - 1 x daily - 7 x weekly - 3 sets - 10 reps - Noodle press  - 1 x daily - 7 x weekly - 3 sets - 10 reps - Side Stepping with Hand Floats  - 1 x daily - 7 x weekly - 3 sets - 10 reps  ASSESSMENT:  CLINICAL IMPRESSION: Some relief from steroid injection into left bursa.  Pain continues in Lt buttock. Able to gain decompression position with full relief of buttock pain while submerged. Added focus on middle trap and rhomboids to assist in standing posture.  Aquatic HEP creation initiated. Goals ongoing     Re-cert DEIDRICK RAINEY is making good progress towards goals but reports increasing L gluteal/piriformis pain with increased activity now.   Continues to have forward flexed posture, with poor glute activation, noted reporting more pain with activities isolating L glute medius especially.  Did not report any back pain with swinging kettle bells but primarily using arms to swing.  With palpation noted trigger points in L glut med and L piriformis, after  review of DN rational, procedures, outcomes and potential side effects, patient verbalized consent to DN treatment in conjunction with manual STM/DTM and TPR to reduce ttp/muscle tension. Muscles treated as indicated above. DN produced normal response with good twitches elicited resulting in palpable reduction in pain/ttp and muscle tension.  Daryll Spisak [facility] continues to demonstrate potential for improvement and would benefit from  extension of skilled therapy for additional 2x/week for 4 more weeks to address impairments and resolve L sided gluteal pain.       OBJECTIVE IMPAIRMENTS: Abnormal gait, decreased activity tolerance, decreased balance, decreased endurance,  decreased mobility, difficulty walking, decreased ROM, decreased strength, impaired perceived functional ability, increased muscle spasms, impaired flexibility, impaired sensation, postural dysfunction, and pain.   ACTIVITY LIMITATIONS: carrying, lifting, bending, standing, squatting, sleeping, stairs, transfers, bed mobility, and locomotion level  PARTICIPATION LIMITATIONS: meal prep, cleaning, laundry, driving, shopping, community activity, and occupation  PERSONAL FACTORS: Time since onset of injury/illness/exacerbation and 3+ comorbidities: T2DM, osteoarthritis, history DVT, chronic anticoagulation, prostate cancer, HTN, HLD, COPD, OSA, GERD, hypothyroidism, chronic LBP, R THA 2006, L THA 2009, previous back surgery  are also affecting patient's functional outcome.   REHAB POTENTIAL: Good  CLINICAL DECISION MAKING: Evolving/moderate complexity  EVALUATION COMPLEXITY: Moderate   GOALS: Goals reviewed with patient? Yes  SHORT TERM GOALS: Target date: 06/06/2023   Patient will be independent with initial HEP.  Baseline:  Goal status: MET   LONG TERM GOALS: Target date: 07/18/2023 extended to 08/21/23  Patient will be independent with advanced/ongoing HEP to improve outcomes and carryover.  Baseline:  Goal status: IN PROGRESS 06/28/23- met for current  2.  Patient will report 75% improvement in low back pain to improve QOL.  Baseline: 5/10 Goal status: MET 06/28/23- back pain has resolved, just having pain in L buttock now.   3.  Patient will be able to stand erect without increased low back pain.    Baseline: stand with flexed posture maintaining ~ 20 deg hip flexion.  Goal status: IN PROGRESS 06/28/23- 15 deg hip flexion in standing  07/24/23 still flexed at hips ~ 10 deg  4.  Patient will demonstrate improved functional strength as demonstrated by 4+/5 RLE strength. Baseline: see objective Goal status: IN PROGRESS 06/28/23- improving strength overall, see objective  5.  Patient  will report at least 6 points improvement on modified Oswestry to demonstrate improved functional ability.  Baseline: 22/50 Goal status: MET 06/28/23 11/50   6.  Patient will tolerate 15 min of treadmill walking to start exercising at gym. Baseline: unable Goal status: IN PROGRESS 06/06/23 - 7 min on treadmill 07/24/23- 7 min on treadmill, limited by L gluteal pain  7.   Patient will tolerate laying flat either supine or prone in order to return to massage therapy for pain relief.  Baseline: unable to lay in prone or supine Goal status: MET 06/06/23- can lay prone over 4 pillows 06/28/23- can lay flatter with HOB raised 15 deg and bolster under knees.  07/24/23- still needs pillows or HOB raised, but was able to get massage successfully.   8. Patient will increase gait speed to 1.0 m/s to improve community access.  Baseline: 0.6 m/s Goal status: IN PROGRESS 07/24/23- typical speed still 0.6 m/s, fast speed 0.8 m/s    9.  Patient will be able to lift and swing 8-10 lbs without LBP in order to return to bowling.   Baseline: unable Goal status: IN PROGRESS 07/24/23- able to carry 10#, swing 5# without pain  10.   Patient will score at least 19/24 on DGI to demonstrate lower risk of falls.  Baseline: NT Goal status: MET 06/28/23 19/24  11.  Patient will be able to complete activities without limitation from L gluteal pain Baseline: 4-5/10 L sciatic symptoms, worsening with standing/walking activities.  Goal status: INITIAL  PLAN:  PT FREQUENCY: 2x/week  PT DURATION: 4 weeks  PLANNED INTERVENTIONS: 97110-Therapeutic exercises, 97530- Therapeutic activity, 97112- Neuromuscular re-education, 97535- Self Care, 95284- Manual therapy, [phone]- Gait training, [phone]- Aquatic Therapy, [id number]- Electrical stimulation (unattended), [phone]- Electrical stimulation (manual), Patient/Family education, Balance training, Stair training, Taping, Joint mobilization, Joint manipulation, Spinal manipulation, Spinal  mobilization, Scar mobilization, Cryotherapy, and Moist heat.  PLAN FOR NEXT SESSION: continue core and extensor strengthening.  Aquatic therapy.  Manual/modalities PRN for L piriformis/gluteal pain.    Corrie Dandy Rotonda) Jiovanni Heeter MPT 08/01/23 11:46 AM [facility] [address] [place], [place], [address] Phone: [phone]   Fax:  [phone]

## 2023-08-05 ENCOUNTER — Ambulatory Visit (HOSPITAL_BASED_OUTPATIENT_CLINIC_OR_DEPARTMENT_OTHER): Payer: Medicare PPO | Admitting: Physical Therapy

## 2023-08-05 ENCOUNTER — Encounter (HOSPITAL_BASED_OUTPATIENT_CLINIC_OR_DEPARTMENT_OTHER): Payer: Self-pay | Admitting: Physical Therapy

## 2023-08-05 DIAGNOSIS — M5459 Other low back pain: Secondary | ICD-10-CM

## 2023-08-05 DIAGNOSIS — R293 Abnormal posture: Secondary | ICD-10-CM

## 2023-08-05 DIAGNOSIS — R262 Difficulty in walking, not elsewhere classified: Secondary | ICD-10-CM

## 2023-08-05 DIAGNOSIS — M6281 Muscle weakness (generalized): Secondary | ICD-10-CM

## 2023-08-05 NOTE — Therapy (Signed)
 OUTPATIENT PHYSICAL THERAPY THORACOLUMBAR TREATMENT  Patient Name: Kyle Delacruz MRN: [id number] DOB:[birth year], 68 y.o., male Today's Date: 08/05/2023  END OF SESSION:  PT End of Session - 08/05/23 1153     Visit Number 17    Date for PT Re-Evaluation 08/21/23    Authorization Type [employer] MCR + BCBS  BCBS VL 50)    Progress Note Due on Visit 19    PT Start Time 1149    PT Stop Time 1227    PT Time Calculation (min) 38 min    Behavior During Therapy WFL for tasks assessed/performed                Past Medical History:  Diagnosis Date   Allergy    Arthritis    osteo   CTS (carpal tunnel syndrome)    DJD (degenerative joint disease)    DM2 (diabetes mellitus, type 2) (HCC)    DVT (deep venous thrombosis) (HCC)    GERD (gastroesophageal reflux disease)    Gout    History of adenomatous polyps of colon 12/29/2019   History of nuclear stress test    Myoview 11/17: EF 54, no ST changes, hypertensive blood pressure response, no ischemia, low risk   Hyperlipidemia    Hypothyroidism    Nicotine addiction    OSA (obstructive sleep apnea)    Prostatitis    Reactive airway disease    Sarcocystosis    Sinus arrhythmia    Sleep apnea    cpap- not wearing   Tinea corporis    Past Surgical History:  Procedure Laterality Date   BILATERAL KNEE ARTHROSCOPY     right- 1997; left 2001   COLONOSCOPY  multiple last 2013   IVC FILTER INSERTION N/A 09/17/2022   Procedure: IVC FILTER INSERTION;  Surgeon: Maeola Harman, MD;  Location: [facility] INVASIVE CV LAB;  Service: Cardiovascular;  Laterality: N/A;   IVC FILTER REMOVAL N/A 01/28/2023   Procedure: IVC FILTER REMOVAL;  Surgeon: Maeola Harman, MD;  Location: [facility] INVASIVE CV LAB;  Service: Cardiovascular;  Laterality: N/A;   KNEE SURGERY Right 2014   torn R medial meniscus- arthroscopy done   LAPAROTOMY N/A 04/10/2022   Procedure: EXPLORATORY LAPAROTOMY, TOTAL COLECTOMY, END ILEOSTOMY;  Surgeon:  Quentin Ore, MD;  Location: [facility];  Service: General;  Laterality: N/A;   PARATHYROIDECTOMY  2005   ROTATOR CUFF REPAIR  2004   right   TONSILLECTOMY     TOTAL HIP ARTHROPLASTY Left 2009   TOTAL HIP ARTHROPLASTY Right 2006   UPPER GASTROINTESTINAL ENDOSCOPY     Patient Active Problem List   Diagnosis Date Noted   History of pulmonary embolism 06/28/2022   History of DVT (deep vein thrombosis) 06/28/2022   Lumbar radiculopathy, chronic 06/28/2022   Nausea and vomiting 06/28/2022   Septic shock (HCC) 06/26/2022   SVT (supraventricular tachycardia) (HCC) 06/19/2022   PAF (paroxysmal atrial fibrillation) (HCC) 06/18/2022   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   High output ileostomy (HCC) 04/21/2022   Chronic anticoagulation 04/21/2022   Normocytic anemia 04/10/2022   Shock (HCC) 04/10/2022   Deep vein thrombosis (DVT) of femoral vein of right lower extremity (HCC) 04/09/2022   Leg DVT (deep venous thromboembolism), chronic, right (HCC) 04/08/2022   Abdominal distension 04/08/2022   Acute respiratory failure with hypoxia (HCC) 07/27/2021   PE (pulmonary thromboembolism) (HCC) 07/26/2021   Carcinoma of prostate (HCC) 07/10/2021   Chronic obstructive pulmonary disease (HCC) 07/10/2021   HLD (hyperlipidemia) 03/10/2021   Hypothyroidism 03/10/2021  Chronic bilateral back pain 03/10/2021   History of adenomatous polyps of colon 12/29/2019   Essential hypertension 06/13/2011   Obstructive sleep apnea (adult) (pediatric) 06/13/2011   Gastro-esophageal reflux disease without esophagitis 05/02/2009    PCP: Chilton Greathouse, MD   REFERRING PROVIDER: Nadara Mode PA-C  REFERRING DIAG: [phone] (ICD-10-CM) - Fusion of spine of lumbar region   Rationale for Evaluation and Treatment: Rehabilitation  THERAPY DIAG:  Other low back pain  Muscle weakness (generalized)  Abnormal posture  Difficulty in walking, not elsewhere classified  ONSET DATE: Back surgery 04/04/22, PE  04/08/22, 06/26/22 Sepsis, 11/05/22 Second back surgery to remove hardware and remove IVC filter  SUBJECTIVE:                                                                                                                                                                                           SUBJECTIVE STATEMENT: Pt had a few days of relief with injection in hip, but pain has intensified again.   Pt is to check back with Ortho if it persists.    POOL ACCESS:  Pt has membership to [facility] with silver sneakers. Pt reports he hasn't been to pool since fall 2024.   FROM UE:AVWU said I need to strengthen my back muscles cause I can't stand up and walk like I want to, I know I can take a fall now, I was sitting around a fire circle and flipped back in a chair, got an x-ray and everything was good, didn't loosen the hardware, bone is healing though everything.  My neck has been a little sore from that too. Has had a lot of surgeries starting with original back surgery on 04/04/22, developed PE on 04/08/22, then developed sepsis on 06/26/22, had increased back pain starting in March 2024, cage in back came loose, had hardware removed and IVC on 11/05/22.  Can't lay flat because sleeping in recliner which also makes neck sore.  Would like to get back to swimming too.   PERTINENT HISTORY:  T2DM, osteoarthritis, history DVT, chronic anticoagulation, prostate cancer, HTN, HLD, COPD, OSA, GERD, hypothyroidism, chronic LBP, R THA 2006, L THA 2009, previous back surgery  PAIN:  Are you having pain? Yes: NPRS scale: 5-6/10 Pain location: L hip  Pain description: ache Aggravating factors: standing and walking > 30 min  Relieving factors: sitting in recliner  PRECAUTIONS: Fall and Other: has ostomy  RED FLAGS: None   WEIGHT BEARING RESTRICTIONS: No  FALLS:  Has patient fallen in last 6 months? Yes. Number of falls 1 - fell backwards in chair and tripped on R foot, weakness in R ankle DF  LIVING  ENVIRONMENT: Lives with: lives with their spouse Lives in: House/apartment Stairs: Yes: External: 6 steps; on left going up 2nd floor bonus room but does not go up those stairs, living area on 1st floor, 6 steps to garage Has following equipment at home: Tour manager, Grab bars, and scooter  OCCUPATION: retired from post office  PLOF: Independent and Leisure: swimming  PATIENT GOALS: return to swimming, be able to walk without pain, walk on treadmill, and bowl again  NEXT MD VISIT: in 5 months  OBJECTIVE:   DIAGNOSTIC FINDINGS:  11/06/22 XR Lumbar spine IMPRESSION:  1. L2-L5 posterior lumbar fusion with instrumentation and anterior  lumbar discectomy. Stable alignment.   PATIENT SURVEYS:  Modified Oswestry 22/50   COGNITION: Overall cognitive status: Within functional limits for tasks assessed     SENSATION: WFL  MUSCLE LENGTH: Hamstrings: significant tightness bilaterally  POSTURE: decreased lumbar lordosis, maintains 20 deg forward hip flexion  PALPATION: Diffuse tenderness low back   LUMBAR ROM:  lumbar AROM limited by fusion, no pain with flexion, unable to extend to neutral due to pain.   LOWER EXTREMITY ROM:   significant tightness bil hips R >L for both IR and ER.    LOWER EXTREMITY MMT:    MMT Right* eval Left* eval Right* 06/28/23  Hip flexion 4 5 4+  Hip extension 3 3   Hip abduction 4 5 4   Hip adduction 4+ 5 5  Knee flexion 5 5 5   Knee extension 5 5 5   Ankle dorsiflexion 3 5 3+  Ankle plantarflexion 4 5 4+   (Blank rows = not tested) * tested in sitting  LUMBAR SPECIAL TESTS:  NA   FUNCTIONAL TESTS:  5 times sit to stand: 15.6 sec    - 240'   Gait speed 0.6 m/s GAIT: Distance walked: 240' Assistive device utilized: None Level of assistance: Complete Independence Comments: wide BOS, forward flexed posture, visually slow     TODAY'S TREATMENT:                                                                                                                               DATE: 08/05/23  Pt seen for aquatic therapy today.  Treatment took place in water 3.5-4.75 ft in depth at the [facility] pool. Temp of water was 91.  Pt entered/exited the pool via stairs independently with bilat rail.  - submerged 4.8 ft unsupported: walking forward/ backwards multiple laps - side stepping R/L -> with arm addct/ abdct with yellow hand floats x 4 laps -Farmers carry with single yellow hand floats forward and back x 2 widths each - bow and arrow, with step back with yellow hand floats x 10 each side (alternating) - UE on wall: hip circles 2 x 10 L , 1 x 10 RLE  -decompression position with noodle wrapped posteriorly across chest cycle (good execution)- cycling  - TrA set with hollow long noodle pull down to  thighs x 12  UE on wall: single leg clams x 10 each ("hurts so good") - staggered stance with bilat shoulder horiz abdct/addct x 10 each - hip ext x 10 each LE - return to walking forward/ backward  DATE: 08/01/23  Pt seen for aquatic therapy today.  Treatment took place in water 3.5-4.75 ft in depth at the [facility] pool. Temp of water was 91.  Pt entered/exited the pool via stairs independently with bilat rail.  - submerged 4.8 ft unsupported: walking forward/ backwards multiple laps - side stepping R/L -> with arm addct/ abdct with rainbow hand floats x 4 laps -Farmers carry yellow HB forward and back x 2 widths ea - UE on wall: hip circles x 18 R/L  (for hip flexor and adductor stretch) CW& CCW;  -decompression position with noodle wrapped posteriorly across chest cycle (good execution); hip add/abd cues for  extended knees for post hip engagement (reports left  toe tingling which stops with discontinued activity) *resisted row using rider band 2 x 10   DATE: 07/29/23  Pt seen for aquatic therapy today.  Treatment took place in water 3.5-4.75 ft in depth at the [facility] pool. Temp of water was  91.  Pt entered/exited the pool via stairs independently with bilat rail.  - submerged 4.8 ft unsupported: walking forward/ backwards multiple laps - side stepping R/L -> with arm addct/ abdct with rainbow hand floats x 4 laps - UE on wall: hip circles x 8 R/L  (for hip flexor and adductor stretch); hip abdct/ addct 2 x 5; L hip ext to toe touch x 10; single leg clam x 8 each  - return to walking forward/ backward  -hollow noodle press for TrA sets in wide stance then staggered x 10 - staggered stance with single arm swing with light resistance bell x 10 each UE; then walking and swing (like bowling) - bow & arrow with step back (yellow hand floats) x 10 -  yellow HB push downs  DATE: 07/25/23  Pt seen for aquatic therapy today.  Treatment took place in water 3.5-4.75 ft in depth at the [facility] pool. Temp of water was 91.  Pt entered/exited the pool via stairs independently with bilat rail.  - submerged 4.8 ft unsupported: walking forward/ backwards  - hip flex stretch at 2nd step - Old man stretch -solid noodle press for TrA sets in wide stance then staggered x 1-10 -plank on bench: fire hydrant -seated rest- mountain climbers - bow & arrow x 10 -  yellow HB push downs  Pt requires the buoyancy and hydrostatic pressure of water for support, and to offload joints by unweighting joint load by at least 50 % in navel deep water and by at least 75-80% in chest to neck deep water.  Viscosity of the water is needed for resistance of strengthening. Water current perturbations provides challenge to standing balance requiring increased core activation.   07/24/2023 Therapeutic Exercise: to improve strength and mobility.  Demo, verbal and tactile cues throughout for technique. Sciatic nerve glide seated Pelvic tilts seated Standing hip flexor stretches Treadmill 1.0 mph, 2 incline, x 7 min  Hip extension GTB x 20 R/L Glut med isometric standing ball press against wall 3 x 10 sec  hold  Therapeutic Activity:  to progress towards functional activities like bowling Suitcase carry with 10# x 90' each side Swings with kettle ball 5# - unilateral x 10 each side, in center x 10 - cues to activate  glutes but primarily using UE to raise  Manual Therapy: to decrease muscle spasm and pain and improve mobility IASTM with percussive device to L piriformis, STM/TPR to L glut med/piriformis, skilled palpation and monitoring during dry needling. Trigger Point Dry Needling  Subsequent Treatment: Instructions provided previously at initial dry needling treatment.  Instructions reviewed, if requested by the patient, prior to subsequent dry needling treatment.   Patient Verbal Consent Given: Yes Education Handout Provided: Previously Provided Muscles Treated: L piriformis, L glut med Electrical Stimulation Performed: No Treatment Response/Outcome: Twitch Response Elicited and Palpable Increase in Muscle Length    07/11/23  Therapeutic Exercise: to improve strength and mobility.  Demo, verbal and tactile cues throughout for technique. Treadmill Incline 2, 1.0 mph x 6 min  Hip extensions 2 x 15 bil - bent over with arms stabilized on physioball to engage core, SBA for safety Leg Press 25# x 20, single leg press 25# x 10 R/L, Ankle press 25# x 15 bil  Hamstring curls 20# x 20 Leg extensions 20# x 20  Lat pull down 25# x 10 standing (SBA needed for safety, unsteady) x 10 seated  Manual Therapy:  IASTM with Percussive device to L glute to decrease pain/spasm     07/08/2023 Pt seen for aquatic therapy today.  Treatment took place in water 3.5-4.75 ft in depth at the [facility] pool. Temp of water was 91.  Pt entered/exited the pool via stairs independently with bilat rail.  - submerged 70, unsupported: walking forward/ backwards  - L stretch - farmer carry with rainbow hand floats-> yellow HB at side forward, back and side stepping. Cues for core engagement - side  stepping with arm addct -> with arm addct with rainbow hand floats  - plank position with hands on bench in water (yellow noodle under arms) x 10 LE hip ext each; fire hydrants and mountain climbers x 5 ea -hip hiking bottom step 2 x 8.  Some discomfort left buttock with descending of left hip -  yellow HB push downs - staggered stance with bilat horiz abdct/ addct with rainbow hand floats x 5 each LE forward - bow & arrow x 10 - hip flex and quad stretch on 2nd step (good stretch)  Pt requires the buoyancy and hydrostatic pressure of water for support, and to offload joints by unweighting joint load by at least 50 % in navel deep water and by at least 75-80% in chest to neck deep water.  Viscosity of the water is needed for resistance of strengthening. Water current perturbations provides challenge to standing balance requiring increased core activation.   06/28/23 Tests & Measures DGI MMT Modified Oswestry Gait Speed Therapeutic Exercise: to improve strength and mobility.  Demo, verbal and tactile cues throughout for technique. Treadmill Incline 2 speed 0.9 x 6 min for warm-up & subjective Self Care: Discussion of sleep positioning while positioned in supine with HOB elevated 15 deg and bolster under knees, importance of prioritizing sleep in bed instead of recliner for hip flexor stretching and cervical positioning.      PATIENT EDUCATION:  Education details: continue HEP as tolerated Person educated: Patient Education method: Explanation, Demonstration, and Handouts Education comprehension: verbalized understanding and returned demonstration  HOME EXERCISE PROGRAM: Access Code: [email or web address] URL: [email or web address] Date: 05/27/2023 Prepared by: Harrie Foreman  Exercises - Supine Hip Internal and External Rotation  - 1 x daily - 7 x weekly - 1 sets - 10 reps - Standing Hip Extension with Counter Support  -  1 x daily - 7 x weekly - 2-3 sets - 10 reps - modified  to bent over on counter.  - Standing Lumbar Extension with Counter  - 1 x daily - 7 x weekly - 1 sets - 10 reps - Modified Thomas Stretch  - 1 x daily - 7 x weekly - 1 sets - 3 reps - 1 min hold   AQUATIC This aquatic home exercise program from [employer] utilizes pictures from land based exercises, but has been adapted prior to lamination and issuance.   Access Code: [email or web address] URL: [email or web address] * not issued yet  ASSESSMENT:  CLINICAL IMPRESSION:  Continued complaint of Lt hip pain.  Improved gradually throughout session, first to 2/10 then eliminated by end of session. Reviewed HEP exercises.  HEP has been laminated but not yet issued. Plan to issue at last scheduled aquatic appt.  Encouraged pt to visit pool prior to last appt to review any questions he may have regarding independent HEP, logistics of pool set up, etc.  Pt is gradually progressing towards remaining goals.      Re-cert Kyle Delacruz is making good progress towards goals but reports increasing L gluteal/piriformis pain with increased activity now.   Continues to have forward flexed posture, with poor glute activation, noted reporting more pain with activities isolating L glute medius especially.  Did not report any back pain with swinging kettle bells but primarily using arms to swing.  With palpation noted trigger points in L glut med and L piriformis, after  review of DN rational, procedures, outcomes and potential side effects, patient verbalized consent to DN treatment in conjunction with manual STM/DTM and TPR to reduce ttp/muscle tension. Muscles treated as indicated above. DN produced normal response with good twitches elicited resulting in palpable reduction in pain/ttp and muscle tension.  Kyle Delacruz [facility] continues to demonstrate potential for improvement and would benefit from  extension of skilled therapy for additional 2x/week for 4 more weeks to address impairments and resolve L sided gluteal  pain.       OBJECTIVE IMPAIRMENTS: Abnormal gait, decreased activity tolerance, decreased balance, decreased endurance, decreased mobility, difficulty walking, decreased ROM, decreased strength, impaired perceived functional ability, increased muscle spasms, impaired flexibility, impaired sensation, postural dysfunction, and pain.   ACTIVITY LIMITATIONS: carrying, lifting, bending, standing, squatting, sleeping, stairs, transfers, bed mobility, and locomotion level  PARTICIPATION LIMITATIONS: meal prep, cleaning, laundry, driving, shopping, community activity, and occupation  PERSONAL FACTORS: Time since onset of injury/illness/exacerbation and 3+ comorbidities: T2DM, osteoarthritis, history DVT, chronic anticoagulation, prostate cancer, HTN, HLD, COPD, OSA, GERD, hypothyroidism, chronic LBP, R THA 2006, L THA 2009, previous back surgery  are also affecting patient's functional outcome.   REHAB POTENTIAL: Good  CLINICAL DECISION MAKING: Evolving/moderate complexity  EVALUATION COMPLEXITY: Moderate   GOALS: Goals reviewed with patient? Yes  SHORT TERM GOALS: Target date: 06/06/2023   Patient will be independent with initial HEP.  Baseline:  Goal status: MET   LONG TERM GOALS: Target date: 07/18/2023 extended to 08/21/23  Patient will be independent with advanced/ongoing HEP to improve outcomes and carryover.  Baseline:  Goal status: IN PROGRESS 06/28/23- met for current  2.  Patient will report 75% improvement in low back pain to improve QOL.  Baseline: 5/10 Goal status: MET 06/28/23- back pain has resolved, just having pain in L buttock now.   3.  Patient will be able to stand erect without increased low back pain.    Baseline: stand with flexed posture maintaining ~ 20  deg hip flexion.  Goal status: IN PROGRESS 06/28/23- 15 deg hip flexion in standing  07/24/23 still flexed at hips ~ 10 deg  4.  Patient will demonstrate improved functional strength as demonstrated by 4+/5 RLE  strength. Baseline: see objective Goal status: IN PROGRESS 06/28/23- improving strength overall, see objective  5.  Patient will report at least 6 points improvement on modified Oswestry to demonstrate improved functional ability.  Baseline: 22/50 Goal status: MET 06/28/23 11/50   6.  Patient will tolerate 15 min of treadmill walking to start exercising at gym. Baseline: unable Goal status: IN PROGRESS 06/06/23 - 7 min on treadmill 07/24/23- 7 min on treadmill, limited by L gluteal pain  7.   Patient will tolerate laying flat either supine or prone in order to return to massage therapy for pain relief.  Baseline: unable to lay in prone or supine Goal status: MET 06/06/23- can lay prone over 4 pillows 06/28/23- can lay flatter with HOB raised 15 deg and bolster under knees.  07/24/23- still needs pillows or HOB raised, but was able to get massage successfully.   8. Patient will increase gait speed to 1.0 m/s to improve community access.  Baseline: 0.6 m/s Goal status: IN PROGRESS 07/24/23- typical speed still 0.6 m/s, fast speed 0.8 m/s    9.  Patient will be able to lift and swing 8-10 lbs without LBP in order to return to bowling.   Baseline: unable Goal status: IN PROGRESS 07/24/23- able to carry 10#, swing 5# without pain  10.   Patient will score at least 19/24 on DGI to demonstrate lower risk of falls.  Baseline: NT Goal status: MET 06/28/23 19/24  11.  Patient will be able to complete activities without limitation from L gluteal pain Baseline: 4-5/10 L sciatic symptoms, worsening with standing/walking activities.  Goal status: INITIAL  PLAN:  PT FREQUENCY: 2x/week  PT DURATION: 4 weeks  PLANNED INTERVENTIONS: 97110-Therapeutic exercises, 97530- Therapeutic activity, 97112- Neuromuscular re-education, 97535- Self Care, 78295- Manual therapy, [phone]- Gait training, [phone]- Aquatic Therapy, 97014- Electrical stimulation (unattended), [phone]- Electrical stimulation (manual), Patient/Family  education, Balance training, Stair training, Taping, Joint mobilization, Joint manipulation, Spinal manipulation, Spinal mobilization, Scar mobilization, Cryotherapy, and Moist heat.  PLAN FOR NEXT SESSION: continue core and extensor strengthening.  Aquatic therapy.  Manual/modalities PRN for L piriformis/gluteal pain.   Mayer Camel, PTA 08/05/23 12:38 PM [facility] [address] [place], [place], [address] Phone: [phone]   Fax:  [phone]

## 2023-08-08 ENCOUNTER — Ambulatory Visit (HOSPITAL_BASED_OUTPATIENT_CLINIC_OR_DEPARTMENT_OTHER): Payer: Medicare PPO | Admitting: Physical Therapy

## 2023-08-08 ENCOUNTER — Encounter (HOSPITAL_BASED_OUTPATIENT_CLINIC_OR_DEPARTMENT_OTHER): Payer: Self-pay | Admitting: Physical Therapy

## 2023-08-08 DIAGNOSIS — M6281 Muscle weakness (generalized): Secondary | ICD-10-CM

## 2023-08-08 DIAGNOSIS — R293 Abnormal posture: Secondary | ICD-10-CM

## 2023-08-08 DIAGNOSIS — R262 Difficulty in walking, not elsewhere classified: Secondary | ICD-10-CM

## 2023-08-08 DIAGNOSIS — M5459 Other low back pain: Secondary | ICD-10-CM | POA: Diagnosis not present

## 2023-08-08 NOTE — Therapy (Signed)
 OUTPATIENT PHYSICAL THERAPY THORACOLUMBAR TREATMENT  Patient Name: Kyle Delacruz MRN: [id number] DOB:1955/12/08, 68 y.o., male Today's Date: 08/08/2023  END OF SESSION:  PT End of Session - 08/08/23 1154     Visit Number 18    Date for PT Re-Evaluation 08/21/23    Authorization Type [employer] MCR + BCBS  BCBS VL 50)    Progress Note Due on Visit 19    PT Start Time 1146    PT Stop Time 1225    PT Time Calculation (min) 39 min    Activity Tolerance Patient tolerated treatment well    Behavior During Therapy WFL for tasks assessed/performed                Past Medical History:  Diagnosis Date   Allergy    Arthritis    osteo   CTS (carpal tunnel syndrome)    DJD (degenerative joint disease)    DM2 (diabetes mellitus, type 2) (HCC)    DVT (deep venous thrombosis) (HCC)    GERD (gastroesophageal reflux disease)    Gout    History of adenomatous polyps of colon 12/29/2019   History of nuclear stress test    Myoview 11/17: EF 54, no ST changes, hypertensive blood pressure response, no ischemia, low risk   Hyperlipidemia    Hypothyroidism    Nicotine addiction    OSA (obstructive sleep apnea)    Prostatitis    Reactive airway disease    Sarcocystosis    Sinus arrhythmia    Sleep apnea    cpap- not wearing   Tinea corporis    Past Surgical History:  Procedure Laterality Date   BILATERAL KNEE ARTHROSCOPY     right- 1997; left 2001   COLONOSCOPY  multiple last 2013   IVC FILTER INSERTION N/A 09/17/2022   Procedure: IVC FILTER INSERTION;  Surgeon: Maeola Harman, MD;  Location: [facility] INVASIVE CV LAB;  Service: Cardiovascular;  Laterality: N/A;   IVC FILTER REMOVAL N/A 01/28/2023   Procedure: IVC FILTER REMOVAL;  Surgeon: Maeola Harman, MD;  Location: [facility] INVASIVE CV LAB;  Service: Cardiovascular;  Laterality: N/A;   KNEE SURGERY Right 2014   torn R medial meniscus- arthroscopy done   LAPAROTOMY N/A 04/10/2022   Procedure: EXPLORATORY  LAPAROTOMY, TOTAL COLECTOMY, END ILEOSTOMY;  Surgeon: Quentin Ore, MD;  Location: [facility];  Service: General;  Laterality: N/A;   PARATHYROIDECTOMY  2005   ROTATOR CUFF REPAIR  2004   right   TONSILLECTOMY     TOTAL HIP ARTHROPLASTY Left 2009   TOTAL HIP ARTHROPLASTY Right 2006   UPPER GASTROINTESTINAL ENDOSCOPY     Patient Active Problem List   Diagnosis Date Noted   History of pulmonary embolism 06/28/2022   History of DVT (deep vein thrombosis) 06/28/2022   Lumbar radiculopathy, chronic 06/28/2022   Nausea and vomiting 06/28/2022   Septic shock (HCC) 06/26/2022   SVT (supraventricular tachycardia) (HCC) 06/19/2022   PAF (paroxysmal atrial fibrillation) (HCC) 06/18/2022   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   High output ileostomy (HCC) 04/21/2022   Chronic anticoagulation 04/21/2022   Normocytic anemia 04/10/2022   Shock (HCC) 04/10/2022   Deep vein thrombosis (DVT) of femoral vein of right lower extremity (HCC) 04/09/2022   Leg DVT (deep venous thromboembolism), chronic, right (HCC) 04/08/2022   Abdominal distension 04/08/2022   Acute respiratory failure with hypoxia (HCC) 07/27/2021   PE (pulmonary thromboembolism) (HCC) 07/26/2021   Carcinoma of prostate (HCC) 07/10/2021   Chronic obstructive pulmonary disease (HCC) 07/10/2021  HLD (hyperlipidemia) 03/10/2021   Hypothyroidism 03/10/2021   Chronic bilateral back pain 03/10/2021   History of adenomatous polyps of colon 12/29/2019   Essential hypertension 06/13/2011   Obstructive sleep apnea (adult) (pediatric) 06/13/2011   Gastro-esophageal reflux disease without esophagitis 05/02/2009    PCP: Chilton Greathouse, MD   REFERRING PROVIDER: Nadara Mode PA-C  REFERRING DIAG: [id number] (ICD-10-CM) - Fusion of spine of lumbar region   Rationale for Evaluation and Treatment: Rehabilitation  THERAPY DIAG:  Other low back pain  Muscle weakness (generalized)  Abnormal posture  Difficulty in walking, not elsewhere  classified  ONSET DATE: Back surgery 04/04/22, PE 04/08/22, 06/26/22 Sepsis, 11/05/22 Second back surgery to remove hardware and remove IVC filter  SUBJECTIVE:                                                                                                                                                                                           SUBJECTIVE STATEMENT: Pt had a few days of relief with injection in hip, but pain has intensified again.   Pt is to check back with Ortho if it persists.    POOL ACCESS:  Pt has membership to [facility] with silver sneakers. Pt reports he hasn't been to pool since fall 2024.   FROM UE:AVWU said I need to strengthen my back muscles cause I can't stand up and walk like I want to, I know I can take a fall now, I was sitting around a fire circle and flipped back in a chair, got an x-ray and everything was good, didn't loosen the hardware, bone is healing though everything.  My neck has been a little sore from that too. Has had a lot of surgeries starting with original back surgery on 04/04/22, developed PE on 04/08/22, then developed sepsis on 06/26/22, had increased back pain starting in March 2024, cage in back came loose, had hardware removed and IVC on 11/05/22.  Can't lay flat because sleeping in recliner which also makes neck sore.  Would like to get back to swimming too.   PERTINENT HISTORY:  T2DM, osteoarthritis, history DVT, chronic anticoagulation, prostate cancer, HTN, HLD, COPD, OSA, GERD, hypothyroidism, chronic LBP, R THA 2006, L THA 2009, previous back surgery  PAIN:  Are you having pain? Yes: NPRS scale: 5-6/10 Pain location: L hip  Pain description: ache Aggravating factors: standing and walking > 30 min  Relieving factors: sitting in recliner  PRECAUTIONS: Fall and Other: has ostomy  RED FLAGS: None   WEIGHT BEARING RESTRICTIONS: No  FALLS:  Has patient fallen in last 6 months? Yes. Number of falls 1 - fell backwards in chair and  tripped on  R foot, weakness in R ankle DF  LIVING ENVIRONMENT: Lives with: lives with their spouse Lives in: House/apartment Stairs: Yes: External: 6 steps; on left going up 2nd floor bonus room but does not go up those stairs, living area on 1st floor, 6 steps to garage Has following equipment at home: Tour manager, Grab bars, and scooter  OCCUPATION: retired from post office  PLOF: Independent and Leisure: swimming  PATIENT GOALS: return to swimming, be able to walk without pain, walk on treadmill, and bowl again  NEXT MD VISIT: in 5 months  OBJECTIVE:   DIAGNOSTIC FINDINGS:  11/06/22 XR Lumbar spine IMPRESSION:  1. L2-L5 posterior lumbar fusion with instrumentation and anterior  lumbar discectomy. Stable alignment.   PATIENT SURVEYS:  Modified Oswestry 22/50   COGNITION: Overall cognitive status: Within functional limits for tasks assessed     SENSATION: WFL  MUSCLE LENGTH: Hamstrings: significant tightness bilaterally  POSTURE: decreased lumbar lordosis, maintains 20 deg forward hip flexion  PALPATION: Diffuse tenderness low back   LUMBAR ROM:  lumbar AROM limited by fusion, no pain with flexion, unable to extend to neutral due to pain.   LOWER EXTREMITY ROM:   significant tightness bil hips R >L for both IR and ER.    LOWER EXTREMITY MMT:    MMT Right* eval Left* eval Right* 06/28/23  Hip flexion 4 5 4+  Hip extension 3 3   Hip abduction 4 5 4   Hip adduction 4+ 5 5  Knee flexion 5 5 5   Knee extension 5 5 5   Ankle dorsiflexion 3 5 3+  Ankle plantarflexion 4 5 4+   (Blank rows = not tested) * tested in sitting  LUMBAR SPECIAL TESTS:  NA   FUNCTIONAL TESTS:  5 times sit to stand: 15.6 sec    - 240'   Gait speed 0.6 m/s GAIT: Distance walked: 240' Assistive device utilized: None Level of assistance: Complete Independence Comments: wide BOS, forward flexed posture, visually slow     TODAY'S TREATMENT:                                                                                                                               DATE: 08/08/23   Exercises - "suitcase carry" with single hand float at side, walking forward/ backward   - Side Stepping with Hand Floats   - Bow and Arrow with Step Back, with Hand Floats   - Hip extension with hands on pool wall   - shoulders - open and close to the front  - Pool noodle/  hand float pull down to front of thighs   - standing single leg hip circles - hold wall or hand floats   - Single Leg Clam - hold wall or hand floats  - Suspended Forward Bicycle Kick with Hand Floats    Pt requires the buoyancy and hydrostatic pressure of water for support, and to offload joints by unweighting  joint load by at least 50 % in navel deep water and by at least 75-80% in chest to neck deep water.  Viscosity of the water is needed for resistance of strengthening. Water current perturbations provides challenge to standing balance requiring increased core activation.   08/05/23 Pt seen for aquatic therapy today.  Treatment took place in water 3.5-4.75 ft in depth at the [facility] pool. Temp of water was 91.  Pt entered/exited the pool via stairs independently with bilat rail.  - submerged 4.8 ft unsupported: walking forward/ backwards multiple laps - side stepping R/L -> with arm addct/ abdct with yellow hand floats x 4 laps -Farmers carry with single yellow hand floats forward and back x 2 widths each - bow and arrow, with step back with yellow hand floats x 10 each side (alternating) - UE on wall: hip circles 2 x 10 L , 1 x 10 RLE  -decompression position with noodle wrapped posteriorly across chest cycle (good execution)- cycling  - TrA set with hollow long noodle pull down to thighs x 12  UE on wall: single leg clams x 10 each ("hurts so good") - staggered stance with bilat shoulder horiz abdct/addct x 10 each - hip ext x 10 each LE - return to walking forward/ backward  DATE: 08/01/23  Pt seen  for aquatic therapy today.  Treatment took place in water 3.5-4.75 ft in depth at the [facility] pool. Temp of water was 91.  Pt entered/exited the pool via stairs independently with bilat rail.  - submerged 4.8 ft unsupported: walking forward/ backwards multiple laps - side stepping R/L -> with arm addct/ abdct with rainbow hand floats x 4 laps -Farmers carry yellow HB forward and back x 2 widths ea - UE on wall: hip circles x 18 R/L  (for hip flexor and adductor stretch) CW& CCW;  -decompression position with noodle wrapped posteriorly across chest cycle (good execution); hip add/abd cues for  extended knees for post hip engagement (reports left  toe tingling which stops with discontinued activity) *resisted row using rider band 2 x 10   DATE: 07/29/23  Pt seen for aquatic therapy today.  Treatment took place in water 3.5-4.75 ft in depth at the [facility] pool. Temp of water was 91.  Pt entered/exited the pool via stairs independently with bilat rail.  - submerged 4.8 ft unsupported: walking forward/ backwards multiple laps - side stepping R/L -> with arm addct/ abdct with rainbow hand floats x 4 laps - UE on wall: hip circles x 8 R/L  (for hip flexor and adductor stretch); hip abdct/ addct 2 x 5; L hip ext to toe touch x 10; single leg clam x 8 each  - return to walking forward/ backward  -hollow noodle press for TrA sets in wide stance then staggered x 10 - staggered stance with single arm swing with light resistance bell x 10 each UE; then walking and swing (like bowling) - bow & arrow with step back (yellow hand floats) x 10 -  yellow HB push downs  DATE: 07/25/23  Pt seen for aquatic therapy today.  Treatment took place in water 3.5-4.75 ft in depth at the [facility] pool. Temp of water was 91.  Pt entered/exited the pool via stairs independently with bilat rail.  - submerged 4.8 ft unsupported: walking forward/ backwards  - hip flex stretch at 2nd  step - Old man stretch -solid noodle press for TrA sets in wide stance then staggered x  1-10 -plank on bench: fire hydrant -seated rest- mountain climbers - bow & arrow x 10 -  yellow HB push downs  Pt requires the buoyancy and hydrostatic pressure of water for support, and to offload joints by unweighting joint load by at least 50 % in navel deep water and by at least 75-80% in chest to neck deep water.  Viscosity of the water is needed for resistance of strengthening. Water current perturbations provides challenge to standing balance requiring increased core activation.   07/24/2023 Therapeutic Exercise: to improve strength and mobility.  Demo, verbal and tactile cues throughout for technique. Sciatic nerve glide seated Pelvic tilts seated Standing hip flexor stretches Treadmill 1.0 mph, 2 incline, x 7 min  Hip extension GTB x 20 R/L Glut med isometric standing ball press against wall 3 x 10 sec hold  Therapeutic Activity:  to progress towards functional activities like bowling Suitcase carry with 10# x 90' each side Swings with kettle ball 5# - unilateral x 10 each side, in center x 10 - cues to activate glutes but primarily using UE to raise  Manual Therapy: to decrease muscle spasm and pain and improve mobility IASTM with percussive device to L piriformis, STM/TPR to L glut med/piriformis, skilled palpation and monitoring during dry needling. Trigger Point Dry Needling  Subsequent Treatment: Instructions provided previously at initial dry needling treatment.  Instructions reviewed, if requested by the patient, prior to subsequent dry needling treatment.   Patient Verbal Consent Given: Yes Education Handout Provided: Previously Provided Muscles Treated: L piriformis, L glut med Electrical Stimulation Performed: No Treatment Response/Outcome: Twitch Response Elicited and Palpable Increase in Muscle Length    07/11/23  Therapeutic Exercise: to improve strength and mobility.   Demo, verbal and tactile cues throughout for technique. Treadmill Incline 2, 1.0 mph x 6 min  Hip extensions 2 x 15 bil - bent over with arms stabilized on physioball to engage core, SBA for safety Leg Press 25# x 20, single leg press 25# x 10 R/L, Ankle press 25# x 15 bil  Hamstring curls 20# x 20 Leg extensions 20# x 20  Lat pull down 25# x 10 standing (SBA needed for safety, unsteady) x 10 seated  Manual Therapy:  IASTM with Percussive device to L glute to decrease pain/spasm     07/08/2023 Pt seen for aquatic therapy today.  Treatment took place in water 3.5-4.75 ft in depth at the [facility] pool. Temp of water was 91.  Pt entered/exited the pool via stairs independently with bilat rail.  - submerged 70, unsupported: walking forward/ backwards  - L stretch - farmer carry with rainbow hand floats-> yellow HB at side forward, back and side stepping. Cues for core engagement - side stepping with arm addct -> with arm addct with rainbow hand floats  - plank position with hands on bench in water (yellow noodle under arms) x 10 LE hip ext each; fire hydrants and mountain climbers x 5 ea -hip hiking bottom step 2 x 8.  Some discomfort left buttock with descending of left hip -  yellow HB push downs - staggered stance with bilat horiz abdct/ addct with rainbow hand floats x 5 each LE forward - bow & arrow x 10 - hip flex and quad stretch on 2nd step (good stretch)  Pt requires the buoyancy and hydrostatic pressure of water for support, and to offload joints by unweighting joint load by at least 50 % in navel deep water and by at least 75-80% in  chest to neck deep water.  Viscosity of the water is needed for resistance of strengthening. Water current perturbations provides challenge to standing balance requiring increased core activation.   06/28/23 Tests & Measures DGI MMT Modified Oswestry Gait Speed Therapeutic Exercise: to improve strength and mobility.  Demo, verbal and  tactile cues throughout for technique. Treadmill Incline 2 speed 0.9 x 6 min for warm-up & subjective Self Care: Discussion of sleep positioning while positioned in supine with HOB elevated 15 deg and bolster under knees, importance of prioritizing sleep in bed instead of recliner for hip flexor stretching and cervical positioning.      PATIENT EDUCATION:  Education details: continue HEP as tolerated Person educated: Patient Education method: Explanation, Demonstration, and Handouts Education comprehension: verbalized understanding and returned demonstration  HOME EXERCISE PROGRAM: Access Code: [email or web address] URL: [email or web address] Date: 05/27/2023 Prepared by: Harrie Foreman  Exercises - Supine Hip Internal and External Rotation  - 1 x daily - 7 x weekly - 1 sets - 10 reps - Standing Hip Extension with Counter Support  - 1 x daily - 7 x weekly - 2-3 sets - 10 reps - modified to bent over on counter.  - Standing Lumbar Extension with Counter  - 1 x daily - 7 x weekly - 1 sets - 10 reps - Modified Thomas Stretch  - 1 x daily - 7 x weekly - 1 sets - 3 reps - 1 min hold   AQUATIC This aquatic home exercise program from [employer] utilizes pictures from land based exercises, but has been adapted prior to lamination and issuance.   Access Code: [email or web address] URL: [email or web address] Issued  This aquatic home exercise program from [employer] utilizes pictures from land based exercises, but has been adapted prior to lamination and issuance.   Access Code: [email or web address] URL: [email or web address] Date: 08/08/2023 Prepared by: Geni Bers  Exercises - "suitcase carry" with single hand float at side, walking forward/ backward  - 3 x weekly - Side Stepping with Hand Floats  - 3 x weekly - Bow and Arrow with Step Back, with Hand Floats   - 3 x weekly - 1-2 sets - 10 reps - Hip extension with hands on pool wall   - 3 x weekly - 2 sets - 10 reps -  shoulders - open and close to the front   - 3 x weekly - 3 sets - 10 reps - Pool noodle/  hand float pull down to front of thighs  - 3 x weekly - 1-2 sets - 10 reps - standing single leg hip circles - hold wall or hand floats   - 3 x weekly - 1-2 sets - 10 reps - Single Leg Clam - hold wall or hand floats  - 1 x daily - 3 x weekly - 1-2 sets - 10 reps - Suspended Forward Bicycle Kick with Hand Floats  - 3 x weekly  ASSESSMENT:  CLINICAL IMPRESSION:  Continued complaint of Lt hip pain.  Has an appt to see MD tomorrow morning.  He states pain reduces during and after aquatic therapy but is nagging by next day.  Pain in fairly new complaint over the last 2 weeks. He does demonstrate  significantly improved standing posture, may be contributing to issue. Pt is directed through final aquatic HEP which has been printed and laminated.  He is given verbal cues, demonstration and written clarification to ensure understanding.  He demonstrates indep. He is encouraged to reach out with any questions. Pt has reached his max  potential in setting and will return to land based intervention.    Re-cert Kyle Delacruz is making good progress towards goals but reports increasing L gluteal/piriformis pain with increased activity now.   Continues to have forward flexed posture, with poor glute activation, noted reporting more pain with activities isolating L glute medius especially.  Did not report any back pain with swinging kettle bells but primarily using arms to swing.  With palpation noted trigger points in L glut med and L piriformis, after  review of DN rational, procedures, outcomes and potential side effects, patient verbalized consent to DN treatment in conjunction with manual STM/DTM and TPR to reduce ttp/muscle tension. Muscles treated as indicated above. DN produced normal response with good twitches elicited resulting in palpable reduction in pain/ttp and muscle tension.  Kyle Delacruz continues to  demonstrate potential for improvement and would benefit from  extension of skilled therapy for additional 2x/week for 4 more weeks to address impairments and resolve L sided gluteal pain.       OBJECTIVE IMPAIRMENTS: Abnormal gait, decreased activity tolerance, decreased balance, decreased endurance, decreased mobility, difficulty walking, decreased ROM, decreased strength, impaired perceived functional ability, increased muscle spasms, impaired flexibility, impaired sensation, postural dysfunction, and pain.   ACTIVITY LIMITATIONS: carrying, lifting, bending, standing, squatting, sleeping, stairs, transfers, bed mobility, and locomotion level  PARTICIPATION LIMITATIONS: meal prep, cleaning, laundry, driving, shopping, community activity, and occupation  PERSONAL FACTORS: Time since onset of injury/illness/exacerbation and 3+ comorbidities: T2DM, osteoarthritis, history DVT, chronic anticoagulation, prostate cancer, HTN, HLD, COPD, OSA, GERD, hypothyroidism, chronic LBP, R THA 2006, L THA 2009, previous back surgery  are also affecting patient's functional outcome.   REHAB POTENTIAL: Good  CLINICAL DECISION MAKING: Evolving/moderate complexity  EVALUATION COMPLEXITY: Moderate   GOALS: Goals reviewed with patient? Yes  SHORT TERM GOALS: Target date: 06/06/2023   Patient will be independent with initial HEP.  Baseline:  Goal status: MET   LONG TERM GOALS: Target date: 07/18/2023 extended to 08/21/23  Patient will be independent with advanced/ongoing HEP to improve outcomes and carryover.  Baseline:  Goal status: IN PROGRESS 06/28/23- met for current/ Met in aquatics 08/08/23  2.  Patient will report 75% improvement in low back pain to improve QOL.  Baseline: 5/10 Goal status: MET 06/28/23- back pain has resolved, just having pain in L buttock now.   3.  Patient will be able to stand erect without increased low back pain.    Baseline: stand with flexed posture maintaining ~ 20 deg hip  flexion.  Goal status: IN PROGRESS 06/28/23- 15 deg hip flexion in standing  07/24/23 still flexed at hips ~ 10 deg  4.  Patient will demonstrate improved functional strength as demonstrated by 4+/5 RLE strength. Baseline: see objective Goal status: IN PROGRESS 06/28/23- improving strength overall, see objective  5.  Patient will report at least 6 points improvement on modified Oswestry to demonstrate improved functional ability.  Baseline: 22/50 Goal status: MET 06/28/23 11/50   6.  Patient will tolerate 15 min of treadmill walking to start exercising at gym. Baseline: unable Goal status: IN PROGRESS 06/06/23 - 7 min on treadmill 07/24/23- 7 min on treadmill, limited by L gluteal pain  7.   Patient will tolerate laying flat either supine or prone in order to return to massage therapy for pain relief.  Baseline: unable to lay in prone or supine Goal status: MET 06/06/23- can lay prone over 4 pillows 06/28/23- can lay flatter with HOB raised 15 deg  and bolster under knees.  07/24/23- still needs pillows or HOB raised, but was able to get massage successfully.   8. Patient will increase gait speed to 1.0 m/s to improve community access.  Baseline: 0.6 m/s Goal status: IN PROGRESS 07/24/23- typical speed still 0.6 m/s, fast speed 0.8 m/s    9.  Patient will be able to lift and swing 8-10 lbs without LBP in order to return to bowling.   Baseline: unable Goal status: IN PROGRESS 07/24/23- able to carry 10#, swing 5# without pain  10.   Patient will score at least 19/24 on DGI to demonstrate lower risk of falls.  Baseline: NT Goal status: MET 06/28/23 19/24  11.  Patient will be able to complete activities without limitation from L gluteal pain Baseline: 4-5/10 L sciatic symptoms, worsening with standing/walking activities.  Goal status: INITIAL  PLAN:  PT FREQUENCY: 2x/week  PT DURATION: 4 weeks  PLANNED INTERVENTIONS: 97110-Therapeutic exercises, 97530- Therapeutic activity, 97112-  Neuromuscular re-education, 97535- Self Care, 11914- Manual therapy, [phone]- Gait training, [phone]- Aquatic Therapy, 97014- Electrical stimulation (unattended), [phone]- Electrical stimulation (manual), Patient/Family education, Balance training, Stair training, Taping, Joint mobilization, Joint manipulation, Spinal manipulation, Spinal mobilization, Scar mobilization, Cryotherapy, and Moist heat.  PLAN FOR NEXT SESSION: continue core and extensor strengthening.  Aquatic therapy.  Manual/modalities PRN for L piriformis/gluteal pain.   Corrie Dandy Vandergrift) Jadrien Narine MPT 08/08/23 11:55 AM [facility] [address] [place], [place], [address] Phone: [phone]   Fax:  [phone]

## 2023-08-12 ENCOUNTER — Ambulatory Visit (HOSPITAL_BASED_OUTPATIENT_CLINIC_OR_DEPARTMENT_OTHER): Payer: Medicare PPO | Admitting: Physical Therapy

## 2023-08-12 ENCOUNTER — Encounter: Payer: Self-pay | Admitting: Physical Therapy

## 2023-08-12 ENCOUNTER — Ambulatory Visit: Payer: Self-pay | Admitting: Physical Therapy

## 2023-08-12 DIAGNOSIS — M6281 Muscle weakness (generalized): Secondary | ICD-10-CM

## 2023-08-12 DIAGNOSIS — M5459 Other low back pain: Secondary | ICD-10-CM

## 2023-08-12 DIAGNOSIS — R293 Abnormal posture: Secondary | ICD-10-CM

## 2023-08-12 DIAGNOSIS — R262 Difficulty in walking, not elsewhere classified: Secondary | ICD-10-CM

## 2023-08-12 NOTE — Therapy (Signed)
 OUTPATIENT PHYSICAL THERAPY TREATMENT / DISCHARGE SUMMARY   Patient Name: Kyle Delacruz MRN: [id number] DOB:18-Mar-1956, 68 y.o., male Today's Date: 08/12/2023  END OF SESSION:  PT End of Session - 08/12/23 1315     Visit Number 19    Date for PT Re-Evaluation 08/21/23    Authorization Type [employer] MCR + BCBS  BCBS VL 50)    Progress Note Due on Visit --    PT Start Time 1315    PT Stop Time 1400    PT Time Calculation (min) 45 min    Activity Tolerance Patient tolerated treatment well    Behavior During Therapy WFL for tasks assessed/performed                 Past Medical History:  Diagnosis Date   Allergy    Arthritis    osteo   CTS (carpal tunnel syndrome)    DJD (degenerative joint disease)    DM2 (diabetes mellitus, type 2) (HCC)    DVT (deep venous thrombosis) (HCC)    GERD (gastroesophageal reflux disease)    Gout    History of adenomatous polyps of colon 12/29/2019   History of nuclear stress test    Myoview 11/17: EF 54, no ST changes, hypertensive blood pressure response, no ischemia, low risk   Hyperlipidemia    Hypothyroidism    Nicotine addiction    OSA (obstructive sleep apnea)    Prostatitis    Reactive airway disease    Sarcocystosis    Sinus arrhythmia    Sleep apnea    cpap- not wearing   Tinea corporis    Past Surgical History:  Procedure Laterality Date   BILATERAL KNEE ARTHROSCOPY     right- 1997; left 2001   COLONOSCOPY  multiple last 2013   IVC FILTER INSERTION N/A 09/17/2022   Procedure: IVC FILTER INSERTION;  Surgeon: Maeola Harman, MD;  Location: [facility] INVASIVE CV LAB;  Service: Cardiovascular;  Laterality: N/A;   IVC FILTER REMOVAL N/A 01/28/2023   Procedure: IVC FILTER REMOVAL;  Surgeon: Maeola Harman, MD;  Location: [facility] INVASIVE CV LAB;  Service: Cardiovascular;  Laterality: N/A;   KNEE SURGERY Right 2014   torn R medial meniscus- arthroscopy done   LAPAROTOMY N/A 04/10/2022   Procedure:  EXPLORATORY LAPAROTOMY, TOTAL COLECTOMY, END ILEOSTOMY;  Surgeon: Quentin Ore, MD;  Location: [facility] ORS;  Service: General;  Laterality: N/A;   PARATHYROIDECTOMY  2005   ROTATOR CUFF REPAIR  2004   right   TONSILLECTOMY     TOTAL HIP ARTHROPLASTY Left 2009   TOTAL HIP ARTHROPLASTY Right 2006   UPPER GASTROINTESTINAL ENDOSCOPY     Patient Active Problem List   Diagnosis Date Noted   History of pulmonary embolism 06/28/2022   History of DVT (deep vein thrombosis) 06/28/2022   Lumbar radiculopathy, chronic 06/28/2022   Nausea and vomiting 06/28/2022   Septic shock (HCC) 06/26/2022   SVT (supraventricular tachycardia) (HCC) 06/19/2022   PAF (paroxysmal atrial fibrillation) (HCC) 06/18/2022   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   High output ileostomy (HCC) 04/21/2022   Chronic anticoagulation 04/21/2022   Normocytic anemia 04/10/2022   Shock (HCC) 04/10/2022   Deep vein thrombosis (DVT) of femoral vein of right lower extremity (HCC) 04/09/2022   Leg DVT (deep venous thromboembolism), chronic, right (HCC) 04/08/2022   Abdominal distension 04/08/2022   Acute respiratory failure with hypoxia (HCC) 07/27/2021   PE (pulmonary thromboembolism) (HCC) 07/26/2021   Carcinoma of prostate (HCC) 07/10/2021   Chronic obstructive  pulmonary disease (HCC) 07/10/2021   HLD (hyperlipidemia) 03/10/2021   Hypothyroidism 03/10/2021   Chronic bilateral back pain 03/10/2021   History of adenomatous polyps of colon 12/29/2019   Essential hypertension 06/13/2011   Obstructive sleep apnea (adult) (pediatric) 06/13/2011   Gastro-esophageal reflux disease without esophagitis 05/02/2009    PCP: Chilton Greathouse, MD   REFERRING PROVIDER: Nadara Mode PA-C  REFERRING DIAG: [phone] (ICD-10-CM) - Fusion of spine of lumbar region   RATIONALE FOR EVALUATION AND TREATMENT: Rehabilitation  THERAPY DIAG:  Other low back pain  Muscle weakness (generalized)  Abnormal posture  Difficulty in walking, not  elsewhere classified  ONSET DATE: Back surgery 04/04/22, PE 04/08/22, 06/26/22 Sepsis, 11/05/22 Second back surgery to remove hardware and remove IVC filter   SUBJECTIVE:                                                                                                                                                                                           SUBJECTIVE STATEMENT: Pt reports he completed his water PT last Thursday and plans to continue with water exercises at the [facility].  He reports he has had increased pain in L buttock for the past few weeks. Saw MD and is scheduled for an [facility] tomorrow.  POOL ACCESS:  Pt has membership to [facility] with silver sneakers. Pt reports he hasn't been to pool since fall 2024.   FROM QI:ONGE said I need to strengthen my back muscles cause I can't stand up and walk like I want to, I know I can take a fall now, I was sitting around a fire circle and flipped back in a chair, got an x-ray and everything was good, didn't loosen the hardware, bone is healing though everything.  My neck has been a little sore from that too. Has had a lot of surgeries starting with original back surgery on 04/04/22, developed PE on 04/08/22, then developed sepsis on 06/26/22, had increased back pain starting in March 2024, cage in back came loose, had hardware removed and IVC on 11/05/22.  Can't lay flat because sleeping in recliner which also makes neck sore.  Would like to get back to swimming too.   PERTINENT HISTORY:  T2DM, osteoarthritis, history DVT, chronic anticoagulation, prostate cancer, HTN, HLD, COPD, OSA, GERD, hypothyroidism, chronic LBP, R THA 2006, L THA 2009, previous back surgery  PAIN:  Are you having pain? Yes: NPRS scale: 3/10 Pain location: L buttock & down leg to foot  Pain description: tingling Aggravating factors: first standing up  Relieving factors: walking   PRECAUTIONS: Fall and Other: has ostomy  RED FLAGS: None   WEIGHT BEARING  RESTRICTIONS:  No  FALLS:  Has patient fallen in last 6 months? Yes. Number of falls 1 - fell backwards in chair and tripped on R foot, weakness in R ankle DF  LIVING ENVIRONMENT: Lives with: lives with their spouse Lives in: House/apartment Stairs: Yes: External: 6 steps; on left going up 2nd floor bonus room but does not go up those stairs, living area on 1st floor, 6 steps to garage Has following equipment at home: Tour manager, Grab bars, and scooter  OCCUPATION: retired from post office  PLOF: Independent and Leisure: swimming  PATIENT GOALS: return to swimming, be able to walk without pain, walk on treadmill, and bowl again  NEXT MD VISIT: in 5 months  OBJECTIVE:   DIAGNOSTIC FINDINGS:  11/06/22 XR Lumbar spine IMPRESSION:  1. L2-L5 posterior lumbar fusion with instrumentation and anterior  lumbar discectomy. Stable alignment.   PATIENT SURVEYS:  Modified Oswestry 22/50   COGNITION: Overall cognitive status: Within functional limits for tasks assessed     SENSATION: WFL  MUSCLE LENGTH: Hamstrings: significant tightness bilaterally  POSTURE: decreased lumbar lordosis, maintains 20 deg forward hip flexion  PALPATION: Diffuse tenderness low back   LUMBAR ROM:  lumbar AROM limited by fusion, no pain with flexion, unable to extend to neutral due to pain.   LOWER EXTREMITY ROM:   significant tightness bil hips R >L for both IR and ER.    LOWER EXTREMITY MMT:    MMT Right* eval Left* eval Right* 06/28/23 R* 08/12/23 L* 08/12/23  Hip flexion 4 5 4+ 4+ 5  Hip extension 3 3  3+ 4-  Hip abduction 4 5 4 5 5   Hip adduction 4+ 5 5 5 5   Knee flexion 5 5 5 5 5   Knee extension 5 5 5 5 5   Ankle dorsiflexion 3 5 3+ 3+ 5  Ankle plantarflexion 4 5 4+     (Blank rows = not tested) * tested in sitting  LUMBAR SPECIAL TESTS:  NA   FUNCTIONAL TESTS:  5 times sit to stand: 15.6 sec   - 240'  Gait speed 0.6 m/s  GAIT: Distance walked: 240' Assistive device utilized:  None Level of assistance: Complete Independence Comments: wide BOS, forward flexed posture, visually slow     TODAY'S TREATMENT:                                                                                                                              DATE:   08/12/23 THERAPEUTIC EXERCISE: To improve strength and endurance.  Demonstration, verbal and tactile cues throughout for technique. NuStep - L4 x 6 min Verbal HEP review  Tests & Measures MMT : 12.68 sec Gait Speed = 0.79 m/s Goal assessmnt  MANUAL THERAPY: To promote normalized muscle tension, improved flexibility, and reduced pain. Trigger Point Dry Needling: Treatment instructions/education: Subsequent Treatment: Instructions provided previously at initial dry needling treatment.  Education Handout Provided: Previously Provided Consent: Patient Verbal Consent Given:  Yes Treatment: Muscles Treated: L glute maximus, medius and minimus, L piriformis Skilled palpation and monitoring of soft tissue during DN Electrical Stimulation Performed: No Treatment Response/Outcome: Twitch response elicited, Palpable increase in muscle length, Decreased tissue resistance noted, and Improved exercise tolerance STM/DTM, manual TPR and pin & stretch to muscles addressed with DN   08/08/23 Exercises - "suitcase carry" with single hand float at side, walking forward/ backward   - Side Stepping with Hand Floats   - Bow and Arrow with Step Back, with Hand Floats   - Hip extension with hands on pool wall   - shoulders - open and close to the front  - Pool noodle/  hand float pull down to front of thighs   - standing single leg hip circles - hold wall or hand floats   - Single Leg Clam - hold wall or hand floats  - Suspended Forward Bicycle Kick with Hand Floats    Pt requires the buoyancy and hydrostatic pressure of water for support, and to offload joints by unweighting joint load by at least 50 % in navel deep water and by at  least 75-80% in chest to neck deep water.  Viscosity of the water is needed for resistance of strengthening. Water current perturbations provides challenge to standing balance requiring increased core activation.   08/05/23 Pt seen for aquatic therapy today.  Treatment took place in water 3.5-4.75 ft in depth at the [facility] pool. Temp of water was 91.  Pt entered/exited the pool via stairs independently with bilat rail.  - submerged 4.8 ft unsupported: walking forward/ backwards multiple laps - side stepping R/L -> with arm addct/ abdct with yellow hand floats x 4 laps -Farmers carry with single yellow hand floats forward and back x 2 widths each - bow and arrow, with step back with yellow hand floats x 10 each side (alternating) - UE on wall: hip circles 2 x 10 L , 1 x 10 RLE  -decompression position with noodle wrapped posteriorly across chest cycle (good execution)- cycling  - TrA set with hollow long noodle pull down to thighs x 12  UE on wall: single leg clams x 10 each ("hurts so good") - staggered stance with bilat shoulder horiz abdct/addct x 10 each - hip ext x 10 each LE - return to walking forward/ backward   PATIENT EDUCATION:  Education details: continue with current HEP and recommended frequency for ongoing HEP at discharge to prevent loss of gains achieved with PT  Person educated: Patient Education method: Explanation Education comprehension: verbalized understanding  HOME EXERCISE PROGRAM: Access Code: [email or web address] URL: [email or web address] Date: 05/27/2023 Prepared by: Harrie Foreman  Exercises - Supine Hip Internal and External Rotation  - 1 x daily - 7 x weekly - 1 sets - 10 reps - Standing Hip Extension with Counter Support  - 1 x daily - 7 x weekly - 2-3 sets - 10 reps - modified to bent over on counter.  - Standing Lumbar Extension with Counter  - 1 x daily - 7 x weekly - 1 sets - 10 reps - Modified Thomas Stretch  - 1 x daily - 7 x  weekly - 1 sets - 3 reps - 1 min hold   AQUATIC This aquatic home exercise program from MedBridge utilizes pictures from land based exercises, but has been adapted prior to lamination and issuance.   Access Code: [email or web address] URL: [email or web address] Issued  This aquatic home exercise program from [employer] utilizes pictures from land based  exercises, but has been adapted prior to lamination and issuance.   Access Code: [email or web address] URL: [email or web address] Date: 08/08/2023 Prepared by: Geni Bers  Exercises - "suitcase carry" with single hand float at side, walking forward/ backward  - 3 x weekly - Side Stepping with Hand Floats  - 3 x weekly - Bow and Arrow with Step Back, with Hand Floats   - 3 x weekly - 1-2 sets - 10 reps - Hip extension with hands on pool wall   - 3 x weekly - 2 sets - 10 reps - shoulders - open and close to the front   - 3 x weekly - 3 sets - 10 reps - Pool noodle/  hand float pull down to front of thighs  - 3 x weekly - 1-2 sets - 10 reps - standing single leg hip circles - hold wall or hand floats   - 3 x weekly - 1-2 sets - 10 reps - Single Leg Clam - hold wall or hand floats  - 1 x daily - 3 x weekly - 1-2 sets - 10 reps - Suspended Forward Bicycle Kick with Hand Floats  - 3 x weekly  ASSESSMENT:  CLINICAL IMPRESSION: Avry reports he continues to have the L glute pain, most pronounced upon initiation of movement such as sit to stand, requiring him to rely on his cane to get started with walking.  TPDN performed to L glute maximus, medius and minimus as well as L piriformis with good twitch responses elicited resulting in palpable reduction in muscle tension and patient able to stand up and walk from the treatment table w/o his can or limitation due to the buttock pain. Limited time remaining for goal assessment but patient was able to further gains in LE strength, although limited hip extension ROM and weakness still evident.  Gait speed essentially unchanged from most recent assessment at 0.79 m/s, although faster than prior to [facility]. Despite inability to fully complete goal assessment, Demorio is pleased with his progress with PT and feels confident transitioning to his HEP and the [facility] including the pool for continued aquatic exercises, therefore will proceed with D/C from PT for this episode.  He is aware that he can request further PT orders from the MD in the future if additional PT needs were to arise.   OBJECTIVE IMPAIRMENTS: Abnormal gait, decreased activity tolerance, decreased balance, decreased endurance, decreased mobility, difficulty walking, decreased ROM, decreased strength, impaired perceived functional ability, increased muscle spasms, impaired flexibility, impaired sensation, postural dysfunction, and pain.   ACTIVITY LIMITATIONS: carrying, lifting, bending, standing, squatting, sleeping, stairs, transfers, bed mobility, and locomotion level  PARTICIPATION LIMITATIONS: meal prep, cleaning, laundry, driving, shopping, community activity, and occupation  PERSONAL FACTORS: Time since onset of injury/illness/exacerbation and 3+ comorbidities: T2DM, osteoarthritis, history DVT, chronic anticoagulation, prostate cancer, HTN, HLD, COPD, OSA, GERD, hypothyroidism, chronic LBP, R THA 2006, L THA 2009, previous back surgery  are also affecting patient's functional outcome.   REHAB POTENTIAL: Good  CLINICAL DECISION MAKING: Evolving/moderate complexity  EVALUATION COMPLEXITY: Moderate   GOALS: Goals reviewed with patient? Yes  SHORT TERM GOALS: Target date: 06/06/2023   Patient will be independent with initial HEP.  Baseline:  Goal status: MET   LONG TERM GOALS: Target date: 07/18/2023 extended to 08/21/23  Patient will be independent with advanced/ongoing HEP to improve outcomes and carryover.  Baseline:  Goal status: MET - 2/31/25 - met for current/ Met in aquatics 08/08/23  2.  Patient will report 75%  improvement  in low back pain to improve QOL.  Baseline: 5/10 Goal status: MET - 06/28/23 - back pain has resolved, just having pain in L buttock now (better after [facility] 08/12/23).   3.  Patient will be able to stand erect without increased low back pain.    Baseline: stand with flexed posture maintaining ~ 20 deg hip flexion; 06/28/23 - 15 deg hip flexion in standing  07/24/23 - still flexed at hips ~ 10 deg Goal status: NOT MET - 08/12/23 - still flexed at hips ~ 10 deg  4.  Patient will demonstrate improved functional strength as demonstrated by 4+/5 RLE strength. Baseline: see objective Goal status: PARTIALLY MET - 08/12/23 - met except hip extension & ankle DF 3+/5  5.  Patient will report at least 6 points improvement on modified Oswestry to demonstrate improved functional ability.  Baseline: 22/50 Goal status: MET - 06/28/23 - 11/50   6.  Patient will tolerate 15 min of treadmill walking to start exercising at gym. Baseline: unable; 06/06/23 - 7 min on treadmill; 07/24/23 - 7 min on treadmill, limited by L gluteal pain Goal status: NOT MET - 08/12/23 - unable to assess  7.   Patient will tolerate laying flat either supine or prone in order to return to massage therapy for pain relief.  Baseline: unable to lay in prone or supine Goal status: MET 06/06/23 - can lay prone over 4 pillows.  06/28/23- can lay flatter with HOB raised 15 deg and bolster under knees.  07/24/23- still needs pillows or HOB raised, but was able to get massage successfully.   8. Patient will increase gait speed to 1.0 m/s to improve community access.  Baseline: 0.6 m/s; 07/24/23 - typical speed still 0.6 m/s, fast speed 0.8 m/s   Goal status: NOT MET - 08/12/23 - 0.79 m/s  9.  Patient will be able to lift and swing 8-10 lbs without LBP in order to return to bowling.   Baseline: unable; 07/24/23 - able to carry 10#, swing 5# without pain Goal status: NOT MET - 08/12/23 - unable to assess  10.   Patient will score at least  19/24 on DGI to demonstrate lower risk of falls.  Baseline: NT Goal status: MET - 06/28/23 - 19/24  11.  Patient will be able to complete activities without limitation from L gluteal pain Baseline: 4-5/10 L sciatic symptoms, worsening with standing/walking activities.  Goal status: PARTIALLY MET - 08/12/23 - Better after TPDN today   PLAN:  PT FREQUENCY: 2x/week  PT DURATION: 4 weeks  PLANNED INTERVENTIONS: 97110-Therapeutic exercises, 97530- Therapeutic activity, 97112- Neuromuscular re-education, 97535- Self Care, 91478- Manual therapy, [phone]- Gait training, [phone]- Aquatic Therapy, 97014- Electrical stimulation (unattended), [phone]- Electrical stimulation (manual), Patient/Family education, Balance training, Stair training, Taping, Joint mobilization, Joint manipulation, Spinal manipulation, Spinal mobilization, Scar mobilization, Cryotherapy, and Moist heat.  PLAN FOR NEXT SESSION: transition to HEP + D/C from PT   PHYSICAL THERAPY DISCHARGE SUMMARY  Visits from Start of Care: 19  Current functional level related to goals / functional outcomes: Refer to above clinical impression and goal assessment.   Remaining deficits: As above. Pt feels confident with ongoing HEP to continue to work on remaining deficits.   Education / Equipment: Land & aquatic based HEPs   Patient agrees to discharge. Patient goals were partially met. Patient is being discharged due to being pleased with the current functional level.   Marry Guan, PT 08/12/2023, 3:50 PM  [facility]  [place] [address]  [address] [place], [place], [address] Phone: [phone]   Fax:  [phone]

## 2023-10-04 ENCOUNTER — Other Ambulatory Visit (HOSPITAL_BASED_OUTPATIENT_CLINIC_OR_DEPARTMENT_OTHER): Payer: Self-pay | Admitting: *Deleted

## 2023-10-04 DIAGNOSIS — J449 Chronic obstructive pulmonary disease, unspecified: Secondary | ICD-10-CM

## 2023-10-04 DIAGNOSIS — I480 Paroxysmal atrial fibrillation: Secondary | ICD-10-CM

## 2023-10-08 ENCOUNTER — Other Ambulatory Visit (HOSPITAL_BASED_OUTPATIENT_CLINIC_OR_DEPARTMENT_OTHER): Payer: Self-pay | Admitting: *Deleted

## 2023-10-08 DIAGNOSIS — Z87891 Personal history of nicotine dependence: Secondary | ICD-10-CM

## 2023-10-10 ENCOUNTER — Encounter: Payer: Federal, State, Local not specified - PPO | Admitting: Pulmonary Disease

## 2023-10-10 ENCOUNTER — Encounter (HOSPITAL_BASED_OUTPATIENT_CLINIC_OR_DEPARTMENT_OTHER): Payer: Federal, State, Local not specified - PPO

## 2023-11-12 ENCOUNTER — Encounter: Payer: Self-pay | Admitting: Family

## 2023-11-12 ENCOUNTER — Ambulatory Visit (INDEPENDENT_AMBULATORY_CARE_PROVIDER_SITE_OTHER): Admitting: Family

## 2023-11-12 VITALS — BP 110/72 | HR 71 | Ht 69.0 in | Wt 219.0 lb

## 2023-11-12 DIAGNOSIS — E119 Type 2 diabetes mellitus without complications: Secondary | ICD-10-CM | POA: Diagnosis not present

## 2023-11-12 DIAGNOSIS — E559 Vitamin D deficiency, unspecified: Secondary | ICD-10-CM | POA: Diagnosis not present

## 2023-11-12 DIAGNOSIS — E782 Mixed hyperlipidemia: Secondary | ICD-10-CM

## 2023-11-12 DIAGNOSIS — I480 Paroxysmal atrial fibrillation: Secondary | ICD-10-CM

## 2023-11-12 DIAGNOSIS — C61 Malignant neoplasm of prostate: Secondary | ICD-10-CM

## 2023-11-12 DIAGNOSIS — I82511 Chronic embolism and thrombosis of right femoral vein: Secondary | ICD-10-CM

## 2023-11-12 DIAGNOSIS — E039 Hypothyroidism, unspecified: Secondary | ICD-10-CM

## 2023-11-12 DIAGNOSIS — J449 Chronic obstructive pulmonary disease, unspecified: Secondary | ICD-10-CM

## 2023-11-12 DIAGNOSIS — M5416 Radiculopathy, lumbar region: Secondary | ICD-10-CM

## 2023-11-12 DIAGNOSIS — Z939 Artificial opening status, unspecified: Secondary | ICD-10-CM | POA: Diagnosis not present

## 2023-11-12 DIAGNOSIS — I471 Supraventricular tachycardia, unspecified: Secondary | ICD-10-CM

## 2023-11-12 LAB — VITAMIN D 25 HYDROXY (VIT D DEFICIENCY, FRACTURES): VITD: 34.95 ng/mL (ref 30.00–100.00)

## 2023-11-12 LAB — CBC WITH DIFFERENTIAL/PLATELET
Basophils Absolute: 0 10*3/uL (ref 0.0–0.1)
Basophils Relative: 0.3 % (ref 0.0–3.0)
Eosinophils Absolute: 0 10*3/uL (ref 0.0–0.7)
Eosinophils Relative: 0.3 % (ref 0.0–5.0)
HCT: 43.2 % (ref 39.0–52.0)
Hemoglobin: 14.4 g/dL (ref 13.0–17.0)
Lymphocytes Relative: 25.5 % (ref 12.0–46.0)
Lymphs Abs: 2.2 10*3/uL (ref 0.7–4.0)
MCHC: 33.2 g/dL (ref 30.0–36.0)
MCV: 100.3 fl — ABNORMAL HIGH (ref 78.0–100.0)
Monocytes Absolute: 0.4 10*3/uL (ref 0.1–1.0)
Monocytes Relative: 4.9 % (ref 3.0–12.0)
Neutro Abs: 5.9 10*3/uL (ref 1.4–7.7)
Neutrophils Relative %: 69 % (ref 43.0–77.0)
Platelets: 136 10*3/uL — ABNORMAL LOW (ref 150.0–400.0)
RBC: 4.31 Mil/uL (ref 4.22–5.81)
RDW: 15.2 % (ref 11.5–15.5)
WBC: 8.6 10*3/uL (ref 4.0–10.5)

## 2023-11-12 LAB — COMPREHENSIVE METABOLIC PANEL WITH GFR
ALT: 18 U/L (ref 0–53)
AST: 12 U/L (ref 0–37)
Albumin: 3.9 g/dL (ref 3.5–5.2)
Alkaline Phosphatase: 57 U/L (ref 39–117)
BUN: 18 mg/dL (ref 6–23)
CO2: 33 meq/L — ABNORMAL HIGH (ref 19–32)
Calcium: 9.7 mg/dL (ref 8.4–10.5)
Chloride: 101 meq/L (ref 96–112)
Creatinine, Ser: 1.21 mg/dL (ref 0.40–1.50)
GFR: 62.43 mL/min (ref >60.00)
Glucose, Bld: 139 mg/dL — ABNORMAL HIGH (ref 70–99)
Potassium: 5.2 meq/L — ABNORMAL HIGH (ref 3.5–5.1)
Sodium: 142 meq/L (ref 135–145)
Total Bilirubin: 1.1 mg/dL (ref 0.2–1.2)
Total Protein: 6.3 g/dL (ref 6.0–8.3)

## 2023-11-12 LAB — IBC + FERRITIN
Ferritin: 467.9 ng/mL — ABNORMAL HIGH (ref 22.0–322.0)
Iron: 166 ug/dL — ABNORMAL HIGH (ref 42–165)
Saturation Ratios: 46.5 % (ref 20.0–50.0)
TIBC: 357 ug/dL (ref 250.0–450.0)
Transferrin: 255 mg/dL (ref 212.0–360.0)

## 2023-11-12 NOTE — Progress Notes (Signed)
 Kyle Delacruz is a 68 y.o. 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   High output ileostomy (HCC) 04/21/2022   Chronic anticoagulation 04/21/2022   Normocytic anemia 04/10/2022   Shock (HCC) 04/10/2022   Deep vein thrombosis (DVT) of femoral vein of right lower extremity (HCC) 04/09/2022   Leg DVT (deep venous thromboembolism), chronic, right (HCC) 04/08/2022   Abdominal distension 04/08/2022   Acute respiratory failure with hypoxia (HCC) 07/27/2021   PE (pulmonary thromboembolism) (HCC) 07/26/2021   Carcinoma of prostate (HCC) 07/10/2021   Chronic obstructive pulmonary disease (HCC) 07/10/2021   HLD (hyperlipidemia) 03/10/2021   Hypothyroidism 03/10/2021   Chronic bilateral back pain 03/10/2021   History of adenomatous polyps of colon 12/29/2019   Essential hypertension 06/13/2011   Obstructive sleep apnea (adult) (pediatric) 06/13/2011   Gastro-esophageal reflux disease without esophagitis 05/02/2009    Current Outpatient Medications  Medication Sig Dispense Refill   albuterol  (VENTOLIN  HFA) 108 (90 Base) MCG/ACT inhaler Inhale 2 puffs into the lungs every 6 (six) hours as needed for wheezing or shortness of breath. 8 g 2   apixaban  (ELIQUIS ) 5 MG TABS tablet Take 1 tablet (5 mg total) by mouth 2 (two) times daily. 60 tablet 0   levothyroxine  (SYNTHROID , LEVOTHROID) 50 MCG tablet Take 50 mcg by mouth daily.     metoprolol  tartrate (LOPRESSOR ) 50 MG tablet TAKE 1 TABLET BY MOUTH TWICE A DAY 180 tablet 3   Multiple Vitamin (MULTIVITAMIN WITH MINERALS) TABS tablet Take 1  tablet by mouth daily.     omeprazole (PRILOSEC) 40 MG capsule Take 40 mg by mouth 2 (two) times daily.     oxyCODONE -acetaminophen  (PERCOCET/ROXICET) 5-325 MG tablet Take 1-2 tablets by mouth every 4 (four) hours as needed for severe pain.     pregabalin (LYRICA) 150 MG capsule Take 150 mg by mouth 2 (two) times daily as needed (pain).     Probiotic Product (ALIGN) 4 MG CAPS Take 2 capsules by mouth daily. gummy     rosuvastatin  (CRESTOR ) 40 MG tablet Take 40 mg by mouth daily.     Vitamin D, Ergocalciferol, (DRISDOL) 1.25 MG (50000 UNIT) CAPS capsule Take 50,000 Units by mouth every Friday.     No current facility-administered medications for this visit.    Allergies: Shellfish allergy, Bee venom, Lisinopril, Metoclopramide, and Penicillins  Past Medical History:  Diagnosis Date   Allergy    Arthritis    osteo   CTS (carpal tunnel syndrome)    DJD (degenerative joint disease)    DM2 (diabetes mellitus, type 2) (HCC)    DVT (deep venous thrombosis) (HCC)    GERD (gastroesophageal reflux disease)    Gout    History of adenomatous polyps of colon 12/29/2019   History of nuclear stress test    Myoview  11/17: EF 54, no ST changes, hypertensive blood pressure response, no ischemia, low risk   Hyperlipidemia    Hypothyroidism    Nicotine addiction    OSA (obstructive sleep apnea)    Prostatitis    Reactive airway disease    Sarcocystosis    Sinus arrhythmia    Sleep  apnea    cpap- not wearing   Tinea corporis     Past Surgical History:  Procedure Laterality Date   BILATERAL KNEE ARTHROSCOPY     right- 1997; left 2001   COLONOSCOPY  multiple last 2013   IVC FILTER INSERTION N/A 09/17/2022   Procedure: IVC FILTER INSERTION;  Surgeon: Sheree Penne Bruckner, MD;  Location: [facility] INVASIVE CV LAB;  Service: Cardiovascular;  Laterality: N/A;   IVC FILTER REMOVAL N/A 01/28/2023   Procedure: IVC FILTER REMOVAL;  Surgeon: Sheree Penne Bruckner, MD;  Location: [facility] INVASIVE CV LAB;   Service: Cardiovascular;  Laterality: N/A;   KNEE SURGERY Right 2014   torn R medial meniscus- arthroscopy done   LAPAROTOMY N/A 04/10/2022   Procedure: EXPLORATORY LAPAROTOMY, TOTAL COLECTOMY, END ILEOSTOMY;  Surgeon: Lyndel Deward PARAS, MD;  Location: [facility] ORS;  Service: General;  Laterality: N/A;   PARATHYROIDECTOMY  2005   ROTATOR CUFF REPAIR  2004   right   TONSILLECTOMY     TOTAL HIP ARTHROPLASTY Left 2009   TOTAL HIP ARTHROPLASTY Right 2006   UPPER GASTROINTESTINAL ENDOSCOPY      Family History  Problem Relation Age of Onset   Stroke Mother    Diabetes Father    Heart disease Father        sp CABG   Diabetes Paternal Grandmother    Colon cancer Neg Hx    Stomach cancer Neg Hx    Esophageal cancer Neg Hx    Rectal cancer Neg Hx    Prostate cancer Neg Hx     Social History   Tobacco Use   Smoking status: Former    Current packs/day: 0.00    Average packs/day: 0.5 packs/day for 45.0 years (22.5 ttl pk-yrs)    Types: Cigarettes    Start date: 86    Quit date: 2020    Years since quitting: 5.5   Smokeless tobacco: Never  Substance Use Topics   Alcohol use: Not Currently    Alcohol/week: 21.0 standard drinks of alcohol    Types: 21 Cans of beer per week    Comment: stopped drinking 05/03/22    Subjective:   Presents today as a new patient; accompanied by wife; Does have PCP at [facility] who is managing prescriptions; did just have CPE/ labs at [facility];  Urology is through [facility]- prostate cancer;  Diet controlled diabetes- notes that Hgba1c was 6.7;  TSH was too high and Synthroid  dosage was lowered; patient thinks dosage was lowered to 25 mcg;  November 2023- history of ostomy;  History of A. Fib Dr. Melanie ( [facility])- seeing once per year;   Wants to get established with new pulmonologist- wife is seeing Dr. Alaine and patient would like to establish;    Objective:  Vitals:   11/12/23 1026  BP: 110/72  Pulse: 71  SpO2: 96%  Weight: 219 lb  (99.3 kg)  Height: 5' 9 (1.753 m)    General: Well developed, well nourished, in no acute distress  Skin : Warm and dry.  Head: Normocephalic and atraumatic  Eyes: Sclera and conjunctiva clear; pupils round and reactive to light; extraocular movements intact  Ears: External normal; canals clear; tympanic membranes normal  Oropharynx: Pink, supple. No suspicious lesions  Neck: Supple without thyromegaly, adenopathy  Lungs: Respirations unlabored; clear to auscultation bilaterally without wheeze, rales, rhonchi  CVS exam: normal rate and regular rhythm.  Neurologic: Alert and oriented; speech intact; face symmetrical; moves all extremities well; CNII-XII intact without focal  deficit    Assessment:  1. PAF (paroxysmal atrial fibrillation) (HCC)   2. SVT (supraventricular tachycardia) (HCC)   3. Diet-controlled diabetes mellitus (HCC)   4. Vitamin D deficiency   5. History of creation of ostomy (HCC)   6. Chronic deep vein thrombosis (DVT) of femoral vein of right lower extremity (HCC)   7. Lumbar radiculopathy, chronic   8. Chronic obstructive pulmonary disease, unspecified COPD type (HCC)   9. Hypothyroidism, unspecified type   10. Mixed hyperlipidemia   11. Carcinoma of prostate [facility]     Plan:  Referral back to his cardiologist; 3.   Diabetes being managed by provider at [facility]; Hgba1c last week was 6.7; 4.  Check Vitamin D level today; 5.   Referral back to general surgeon- was due for follow up there in October 2024; 6.   Referral back to his vascular provider; 7.   Referral back to his orthopedist; 8.   Referral to pulmonology- specifically requesting Dr. Alaine; 9.   Discussed need to take medication daily as prescribed; he plans to get labs re-check with [facility] in the next few weeks;  10. Stay on Crestor  as prescribed; 11. Continue with urology through [facility];   Spent 50 minutes reviewing notes and records and developing updated treatment plan  No follow-ups on file.   Orders Placed This Encounter  Procedures   IBC + Ferritin   Vitamin D (25 hydroxy)   CBC with Differential/Platelet   Comp Met (CMET)   Ambulatory referral to Cardiology    Referral Priority:   Routine    Referral Type:   Consultation    Referral Reason:   Specialty Services Required    Referred to Provider:   Lelon Glendia DASEN, PA-C    Number of Visits Requested:   1   Ambulatory referral to Vascular Surgery    Referral Priority:   Routine    Referral Type:   Surgical    Referral Reason:   Specialty Services Required    Referred to Provider:   Sheree Penne Bruckner, MD    Requested Specialty:   Vascular Surgery    Number of Visits Requested:   1   Ambulatory referral to General Surgery    Referral Priority:   Routine    Referral Type:   Surgical    Referral Reason:   Specialty Services Required    Referred to Provider:   Lyndel Deward PARAS, MD    Requested Specialty:   General Surgery    Number of Visits Requested:   1   Ambulatory referral to Orthopedic Surgery    Referral Priority:   Routine    Referral Type:   Surgical    Referral Reason:   Specialty Services Required    Referred to Provider:   Evern Donnajean Kayser, MD    Requested Specialty:   Orthopedic Surgery    Number of Visits Requested:   1   Ambulatory referral to Pulmonology    Referral Priority:   Routine    Referral Type:   Consultation    Referral Reason:   Specialty Services Required    Referred to Provider:   Alaine Vicenta NOVAK, MD    Requested Specialty:   Pulmonary Disease    Number of Visits Requested:   1    Requested Prescriptions    No prescriptions requested or ordered in this encounter

## 2023-11-12 NOTE — Patient Instructions (Addendum)
 Please talk to your [facility] provider to get your patient portal set up so we all have access to your records; ask your [facility] PCP to get you to ENT at the [facility] to discuss the [facility] treatment for sleep apnea;   I have updated the referral to cardiology and vascular surgery for you. You will also hear from general surgery and orthopedic surgery for follow up.

## 2023-11-13 ENCOUNTER — Ambulatory Visit: Payer: Self-pay | Admitting: Family

## 2023-11-13 ENCOUNTER — Other Ambulatory Visit: Payer: Self-pay | Admitting: Family

## 2023-11-13 DIAGNOSIS — R899 Unspecified abnormal finding in specimens from other organs, systems and tissues: Secondary | ICD-10-CM

## 2023-11-18 ENCOUNTER — Other Ambulatory Visit: Payer: Self-pay | Admitting: Family

## 2023-11-18 DIAGNOSIS — R899 Unspecified abnormal finding in specimens from other organs, systems and tissues: Secondary | ICD-10-CM

## 2023-11-20 ENCOUNTER — Other Ambulatory Visit: Payer: Self-pay | Admitting: Family

## 2023-11-20 ENCOUNTER — Ambulatory Visit: Payer: Self-pay | Admitting: Family

## 2023-11-20 ENCOUNTER — Other Ambulatory Visit (INDEPENDENT_AMBULATORY_CARE_PROVIDER_SITE_OTHER)

## 2023-11-20 ENCOUNTER — Other Ambulatory Visit

## 2023-11-20 DIAGNOSIS — R7989 Other specified abnormal findings of blood chemistry: Secondary | ICD-10-CM

## 2023-11-20 DIAGNOSIS — R899 Unspecified abnormal finding in specimens from other organs, systems and tissues: Secondary | ICD-10-CM | POA: Diagnosis not present

## 2023-11-20 DIAGNOSIS — R799 Abnormal finding of blood chemistry, unspecified: Secondary | ICD-10-CM

## 2023-11-20 DIAGNOSIS — I82501 Chronic embolism and thrombosis of unspecified deep veins of right lower extremity: Secondary | ICD-10-CM

## 2023-11-20 LAB — BASIC METABOLIC PANEL WITH GFR
BUN: 22 mg/dL (ref 6–23)
CO2: 29 meq/L (ref 19–32)
Calcium: 9.5 mg/dL (ref 8.4–10.5)
Chloride: 106 meq/L (ref 96–112)
Creatinine, Ser: 1.55 mg/dL — ABNORMAL HIGH (ref 0.40–1.50)
GFR: 46.38 mL/min — ABNORMAL LOW (ref >60.00)
Glucose, Bld: 138 mg/dL — ABNORMAL HIGH (ref 70–99)
Potassium: 4.6 meq/L (ref 3.5–5.1)
Sodium: 144 meq/L (ref 135–145)

## 2023-11-20 LAB — IBC + FERRITIN
Ferritin: 550 ng/mL — ABNORMAL HIGH (ref 22.0–322.0)
Iron: 167 ug/dL — ABNORMAL HIGH (ref 42–165)
Saturation Ratios: 43.7 % (ref 20.0–50.0)
TIBC: 382.2 ug/dL (ref 250.0–450.0)
Transferrin: 273 mg/dL (ref 212.0–360.0)

## 2023-11-28 ENCOUNTER — Encounter: Payer: Self-pay | Admitting: Family

## 2023-12-06 ENCOUNTER — Other Ambulatory Visit: Payer: Self-pay | Admitting: Family

## 2023-12-06 DIAGNOSIS — Z86718 Personal history of other venous thrombosis and embolism: Secondary | ICD-10-CM

## 2023-12-06 DIAGNOSIS — I82501 Chronic embolism and thrombosis of unspecified deep veins of right lower extremity: Secondary | ICD-10-CM

## 2023-12-06 DIAGNOSIS — I82411 Acute embolism and thrombosis of right femoral vein: Secondary | ICD-10-CM

## 2023-12-06 DIAGNOSIS — I2699 Other pulmonary embolism without acute cor pulmonale: Secondary | ICD-10-CM

## 2023-12-09 ENCOUNTER — Inpatient Hospital Stay (HOSPITAL_BASED_OUTPATIENT_CLINIC_OR_DEPARTMENT_OTHER): Admitting: Family

## 2023-12-09 ENCOUNTER — Encounter: Payer: Self-pay | Admitting: Family

## 2023-12-09 ENCOUNTER — Inpatient Hospital Stay: Attending: Hematology & Oncology

## 2023-12-09 VITALS — BP 114/71 | HR 61 | Temp 98.5°F | Resp 18 | Ht 70.0 in | Wt 230.8 lb

## 2023-12-09 DIAGNOSIS — E785 Hyperlipidemia, unspecified: Secondary | ICD-10-CM | POA: Insufficient documentation

## 2023-12-09 DIAGNOSIS — R7983 Abnormal findings of blood amino-acid level: Secondary | ICD-10-CM | POA: Diagnosis not present

## 2023-12-09 DIAGNOSIS — K219 Gastro-esophageal reflux disease without esophagitis: Secondary | ICD-10-CM | POA: Insufficient documentation

## 2023-12-09 DIAGNOSIS — Z86718 Personal history of other venous thrombosis and embolism: Secondary | ICD-10-CM

## 2023-12-09 DIAGNOSIS — C61 Malignant neoplasm of prostate: Secondary | ICD-10-CM | POA: Diagnosis not present

## 2023-12-09 DIAGNOSIS — R7989 Other specified abnormal findings of blood chemistry: Secondary | ICD-10-CM | POA: Diagnosis not present

## 2023-12-09 DIAGNOSIS — I82411 Acute embolism and thrombosis of right femoral vein: Secondary | ICD-10-CM

## 2023-12-09 DIAGNOSIS — Z87891 Personal history of nicotine dependence: Secondary | ICD-10-CM | POA: Insufficient documentation

## 2023-12-09 DIAGNOSIS — I82501 Chronic embolism and thrombosis of unspecified deep veins of right lower extremity: Secondary | ICD-10-CM | POA: Diagnosis present

## 2023-12-09 DIAGNOSIS — E538 Deficiency of other specified B group vitamins: Secondary | ICD-10-CM | POA: Diagnosis not present

## 2023-12-09 DIAGNOSIS — I2699 Other pulmonary embolism without acute cor pulmonale: Secondary | ICD-10-CM

## 2023-12-09 DIAGNOSIS — E119 Type 2 diabetes mellitus without complications: Secondary | ICD-10-CM | POA: Insufficient documentation

## 2023-12-09 DIAGNOSIS — J45909 Unspecified asthma, uncomplicated: Secondary | ICD-10-CM | POA: Diagnosis not present

## 2023-12-09 DIAGNOSIS — M1990 Unspecified osteoarthritis, unspecified site: Secondary | ICD-10-CM | POA: Insufficient documentation

## 2023-12-09 DIAGNOSIS — G4700 Insomnia, unspecified: Secondary | ICD-10-CM | POA: Diagnosis not present

## 2023-12-09 DIAGNOSIS — Z79890 Hormone replacement therapy (postmenopausal): Secondary | ICD-10-CM | POA: Diagnosis not present

## 2023-12-09 DIAGNOSIS — Z79899 Other long term (current) drug therapy: Secondary | ICD-10-CM | POA: Insufficient documentation

## 2023-12-09 DIAGNOSIS — Z8546 Personal history of malignant neoplasm of prostate: Secondary | ICD-10-CM | POA: Diagnosis not present

## 2023-12-09 DIAGNOSIS — Z860101 Personal history of adenomatous and serrated colon polyps: Secondary | ICD-10-CM | POA: Insufficient documentation

## 2023-12-09 DIAGNOSIS — E039 Hypothyroidism, unspecified: Secondary | ICD-10-CM | POA: Insufficient documentation

## 2023-12-09 LAB — CBC WITH DIFFERENTIAL (CANCER CENTER ONLY)
Abs Immature Granulocytes: 0.18 K/uL — ABNORMAL HIGH (ref 0.00–0.07)
Basophils Absolute: 0 K/uL (ref 0.0–0.1)
Basophils Relative: 0 %
Eosinophils Absolute: 0 K/uL (ref 0.0–0.5)
Eosinophils Relative: 0 %
HCT: 35.7 % — ABNORMAL LOW (ref 39.0–52.0)
Hemoglobin: 12.2 g/dL — ABNORMAL LOW (ref 13.0–17.0)
Immature Granulocytes: 2 %
Lymphocytes Relative: 20 %
Lymphs Abs: 2.1 K/uL (ref 0.7–4.0)
MCH: 33.7 pg (ref 26.0–34.0)
MCHC: 34.2 g/dL (ref 30.0–36.0)
MCV: 98.6 fL (ref 80.0–100.0)
Monocytes Absolute: 0.7 K/uL (ref 0.1–1.0)
Monocytes Relative: 7 %
Neutro Abs: 7.7 K/uL (ref 1.7–7.7)
Neutrophils Relative %: 71 %
Platelet Count: 195 K/uL (ref 150–400)
RBC: 3.62 MIL/uL — ABNORMAL LOW (ref 4.22–5.81)
RDW: 14.5 % (ref 11.5–15.5)
WBC Count: 10.7 K/uL — ABNORMAL HIGH (ref 4.0–10.5)
nRBC: 0.4 % — ABNORMAL HIGH (ref 0.0–0.2)

## 2023-12-09 LAB — CMP (CANCER CENTER ONLY)
ALT: 19 U/L (ref 0–44)
AST: 19 U/L (ref 15–41)
Albumin: 4 g/dL (ref 3.5–5.0)
Alkaline Phosphatase: 67 U/L (ref 38–126)
Anion gap: 12 (ref 5–15)
BUN: 27 mg/dL — ABNORMAL HIGH (ref 8–23)
CO2: 22 mmol/L (ref 22–32)
Calcium: 9.6 mg/dL (ref 8.9–10.3)
Chloride: 106 mmol/L (ref 98–111)
Creatinine: 1.23 mg/dL (ref 0.61–1.24)
GFR, Estimated: >60 mL/min (ref >60)
Glucose, Bld: 132 mg/dL — ABNORMAL HIGH (ref 70–99)
Potassium: 4.1 mmol/L (ref 3.5–5.1)
Sodium: 140 mmol/L (ref 135–145)
Total Bilirubin: 0.6 mg/dL (ref 0.0–1.2)
Total Protein: 6.4 g/dL — ABNORMAL LOW (ref 6.5–8.1)

## 2023-12-09 LAB — ANTITHROMBIN III: AntiThromb III Func: 120 % (ref 75–120)

## 2023-12-09 NOTE — Progress Notes (Signed)
 Hematology/Oncology Consultation   Name: Kyle Delacruz      MRN: [id number]    Location: Room/bed info not found  Date: 12/09/2023 Time:1:10 PM   REFERRING PHYSICIAN:  Leita Eliza Elbe, FNP  REASON FOR CONSULT: Elevated ferritin, Leg DVT, chronic right    DIAGNOSIS:  Elevated ferritin History of right lower extremity DVT and PE  HISTORY OF PRESENT ILLNESS: Kyle Delacruz is a very pleasant 68 yo African American gentleman with history of right lower extremity DVT as well as pulmonary embolism diagnosed in 2023 and most recently had an elevated ferritin level.  Iron saturation was 43% and ferritin 550 a couple weeks ago. A year ago he was noted to be iron deficient with an iron saturation of 7%.  No known familial history of hemochromatosis.  He developed a right lower extremity DVT and submassive PE with right heart strain in November 2023 after having spine surgery in March 2023. He is on lifelong anticoagulation with Eliquis .  No hyper coag panel found in the system.  During his admission for the DVT/PE he required a total colectomy with ileostomy secondary to hemoperitoneum with colonic ileus.  He also had a temporary IVC filter placed when he had to back in for spine surgery revision in Summer 2024 and then had it removed.  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 This broke a portion of his left leg and caused rotation of his hips. He has had chronic issues in the past requiring multiple surgeries as a result. He states that he never had any issues with surgery or recovery until his back surgery in 2023.  He has significant arthritis throughout the back pelvis and legs. He starts PT again next week.  He ambulates with an cane for added support.  No falls or syncope reported.  In June 2018 he was diagnosed with prostate cancer. On biopsy he had two foci, 3+3 Gleason score adenocarcinoma, 5%  of the left base and 3% of the left mid prostate with high-grade pin. He states that he has not required surgery, chemo or radiation and is followed closely by Urology.  He states that his most recent PSA was 3.4.  He states that his mother passed away from a stroke and his maternal first cousin has colon cancer and recently developed PE.  No history of diabetes. No thyroid  disease.  No fever, chills, n/v, cough, rash, dizziness, SOB, chest pain, palpitations, abdominal pain or changes in bladder habits.  Ostomy is working well per patient. No issues.  No blood loss noted. No abnormal bruising, bo petechiae.  He quit smoking 6 years ago.  He enjoys a few beers socially in the summer and the occasional liquor drink socially in the winter.  No recreational drug use.  Appetite and hydration are good. Weight is described as stable at 230 lbs.   ROS: All other 10 point review of systems is negative.   PAST MEDICAL HISTORY:   Past Medical History:  Diagnosis Date   Allergy    Arthritis    osteo   CTS (carpal tunnel syndrome)    DJD (degenerative joint disease)    DM2 (diabetes mellitus, type 2) (HCC)    DVT (deep venous thrombosis) (HCC)    GERD (gastroesophageal reflux disease)    Gout    History of adenomatous polyps of colon 12/29/2019   History of nuclear  stress test    Myoview  11/17: EF 54, no ST changes, hypertensive blood pressure response, no ischemia, low risk   Hyperlipidemia    Hypothyroidism    Nicotine addiction    OSA (obstructive sleep apnea)    Prostatitis    Reactive airway disease    Sarcocystosis    Sinus arrhythmia    Sleep apnea    cpap- not wearing   Tinea corporis     ALLERGIES: Allergies  Allergen Reactions   Shellfish Allergy Anaphylaxis and Swelling   Bee Venom Swelling   Lisinopril Other (See Comments)    Unknown   Metoclopramide Other (See Comments)    Causes shaking. Sweat like crazy    Penicillins Hives      MEDICATIONS:  Current  Outpatient Medications on File Prior to Visit  Medication Sig Dispense Refill   albuterol  (VENTOLIN  HFA) 108 (90 Base) MCG/ACT inhaler Inhale 2 puffs into the lungs every 6 (six) hours as needed for wheezing or shortness of breath. 8 g 2   apixaban  (ELIQUIS ) 5 MG TABS tablet Take 1 tablet (5 mg total) by mouth 2 (two) times daily. 60 tablet 0   levothyroxine  (SYNTHROID , LEVOTHROID) 50 MCG tablet Take 50 mcg by mouth daily.     metoprolol  tartrate (LOPRESSOR ) 50 MG tablet TAKE 1 TABLET BY MOUTH TWICE A DAY 180 tablet 3   Multiple Vitamin (MULTIVITAMIN WITH MINERALS) TABS tablet Take 1 tablet by mouth daily.     omeprazole (PRILOSEC) 40 MG capsule Take 40 mg by mouth 2 (two) times daily.     oxyCODONE -acetaminophen  (PERCOCET/ROXICET) 5-325 MG tablet Take 1-2 tablets by mouth every 4 (four) hours as needed for severe pain.     pregabalin (LYRICA) 150 MG capsule Take 150 mg by mouth 2 (two) times daily as needed (pain).     Probiotic Product (ALIGN) 4 MG CAPS Take 2 capsules by mouth daily. gummy     rosuvastatin  (CRESTOR ) 40 MG tablet Take 40 mg by mouth daily.     Vitamin D , Ergocalciferol , (DRISDOL) 1.25 MG (50000 UNIT) CAPS capsule Take 50,000 Units by mouth every Friday.     No current facility-administered medications on file prior to visit.     PAST SURGICAL HISTORY Past Surgical History:  Procedure Laterality Date   BILATERAL KNEE ARTHROSCOPY     right- 1997; left 2001   COLONOSCOPY  multiple last 2013   IVC FILTER INSERTION N/A 09/17/2022   Procedure: IVC FILTER INSERTION;  Surgeon: Sheree Penne Bruckner, MD;  [facility] INVASIVE CV LAB;  Service: Cardiovascular;  Laterality: N/A;   IVC FILTER REMOVAL N/A 01/28/2023   Procedure: IVC FILTER REMOVAL;  Surgeon: Sheree Penne Bruckner, MD;  Location: [facility] INVASIVE CV LAB;  Service: Cardiovascular;  Laterality: N/A;   KNEE SURGERY Right 2014   torn R medial meniscus- arthroscopy done   LAPAROTOMY N/A 04/10/2022   Procedure:  EXPLORATORY LAPAROTOMY, TOTAL COLECTOMY, END ILEOSTOMY;  Surgeon: Lyndel Deward PARAS, MD;  Location: [facility] ORS;  Service: General;  Laterality: N/A;   PARATHYROIDECTOMY  2005   ROTATOR CUFF REPAIR  2004   right   TONSILLECTOMY     TOTAL HIP ARTHROPLASTY Left 2009   TOTAL HIP ARTHROPLASTY Right 2006   UPPER GASTROINTESTINAL ENDOSCOPY      FAMILY HISTORY: Family History  Problem Relation Age of Onset   Stroke Mother    Diabetes Father    Heart disease Father        sp CABG   Diabetes Paternal Grandmother  Colon cancer Neg Hx    Stomach cancer Neg Hx    Esophageal cancer Neg Hx    Rectal cancer Neg Hx    Prostate cancer Neg Hx     SOCIAL HISTORY:  reports that he quit smoking about 5 years ago. His smoking use included cigarettes. He started smoking about 50 years ago. He has a 22.5 pack-year smoking history. He has never used smokeless tobacco. He reports that he does not currently use alcohol after a past usage of about 21.0 standard drinks of alcohol per week. He reports that he does not use drugs.  PERFORMANCE STATUS: The patient's performance status is 1 - Symptomatic but completely ambulatory  PHYSICAL EXAM: Most Recent Vital Signs: Blood pressure 114/71, pulse 61, temperature 98.5 F (36.9 C), temperature source Oral, resp. rate 18, height 5' 10 (1.778 m), weight 230 lb 12.8 oz (104.7 kg), SpO2 96%. BP 114/71 (BP Location: Right Arm, Patient Position: Sitting)   Pulse 61   Temp 98.5 F (36.9 C) (Oral)   Resp 18   Ht 5' 10 (1.778 m)   Wt 230 lb 12.8 oz (104.7 kg)   SpO2 96%   BMI 33.12 kg/m   General Appearance:    Alert, cooperative, no distress, appears stated age  Head:    Normocephalic, without obvious abnormality, atraumatic  Eyes:    PERRL, conjunctiva/corneas clear, EOM's intact, fundi    benign, both eyes             Throat:   Lips, mucosa, and tongue normal; teeth and gums normal  Neck:   Supple, symmetrical, trachea midline, no adenopathy;        thyroid :  No enlargement/tenderness/nodules; no carotid   bruit or JVD  Back:     Symmetric, no curvature, ROM normal, no CVA tenderness  Lungs:     Clear to auscultation bilaterally, respirations unlabored  Chest wall:    No tenderness or deformity  Heart:    Regular rate and rhythm, S1 and S2 normal, no murmur, rub   or gallop  Abdomen:     Soft, non-tender, bowel sounds active all four quadrants,    no masses, no organomegaly        Extremities:   Extremities normal, atraumatic, no cyanosis or edema  Pulses:   2+ and symmetric all extremities  Skin:   Skin color, texture, turgor normal, no rashes or lesions  Lymph nodes:   Cervical, supraclavicular, and axillary nodes normal  Neurologic:   CNII-XII intact. Normal strength, sensation and reflexes      throughout    LABORATORY DATA:  Results for orders placed or performed in visit on 12/09/23 (from the past 48 hours)  CMP (Cancer Center only)     Status: Abnormal   Collection Time: 12/09/23 11:44 AM  Result Value Ref Range   Sodium 140 135 - 145 mmol/L   Potassium 4.1 3.5 - 5.1 mmol/L   Chloride 106 98 - 111 mmol/L   CO2 22 22 - 32 mmol/L   Glucose, Bld 132 (H) 70 - 99 mg/dL    Comment: Glucose reference range applies only to samples taken after fasting for at least 8 hours.   BUN 27 (H) 8 - 23 mg/dL   Creatinine 8.76 9.38 - 1.24 mg/dL   Calcium  9.6 8.9 - 10.3 mg/dL   Total Protein 6.4 (L) 6.5 - 8.1 g/dL   Albumin  4.0 3.5 - 5.0 g/dL   AST 19 15 - 41 U/L   ALT  19 0 - 44 U/L   Alkaline Phosphatase 67 38 - 126 U/L   Total Bilirubin 0.6 0.0 - 1.2 mg/dL   GFR, Estimated >39 >39 mL/min    Comment: (NOTE) Calculated using the CKD-EPI Creatinine Equation (2021)    Anion gap 12 5 - 15    Comment: Performed at [facility], [address] [place], [place] [address]  CBC with Differential (Cancer Center Only)     Status: Abnormal   Collection Time: 12/09/23 11:44 AM  Result Value Ref Range   WBC Count 10.7 (H) 4.0 -  10.5 K/uL   RBC 3.62 (L) 4.22 - 5.81 MIL/uL   Hemoglobin 12.2 (L) 13.0 - 17.0 g/dL   HCT 64.2 (L) 60.9 - 47.9 %   MCV 98.6 80.0 - 100.0 fL   MCH 33.7 26.0 - 34.0 pg   MCHC 34.2 30.0 - 36.0 g/dL   RDW 85.4 88.4 - 84.4 %   Platelet Count 195 150 - 400 K/uL   nRBC 0.4 (H) 0.0 - 0.2 %   Neutrophils Relative % 71 %   Neutro Abs 7.7 1.7 - 7.7 K/uL   Lymphocytes Relative 20 %   Lymphs Abs 2.1 0.7 - 4.0 K/uL   Monocytes Relative 7 %   Monocytes Absolute 0.7 0.1 - 1.0 K/uL   Eosinophils Relative 0 %   Eosinophils Absolute 0.0 0.0 - 0.5 K/uL   Basophils Relative 0 %   Basophils Absolute 0.0 0.0 - 0.1 K/uL   Immature Granulocytes 2 %   Abs Immature Granulocytes 0.18 (H) 0.00 - 0.07 K/uL    Comment: Performed at [facility], [address] [place], [place] [address]      RADIOGRAPHY: No results found.     PATHOLOGY: None  ASSESSMENT/PLAN: Mr. Elmore is a very pleasant 68 yo African American gentleman with history of right lower extremity DVT as well as pulmonary embolism diagnosed in 2023 and most recently had an elevated ferritin level.  We drew a hyper coag panel as well as hemochromatosis DNA today.  Results are pending. We will schedule follow-up once results are available.   All questions were answered. The patient knows to call the clinic with any problems, questions or concerns. We can certainly see the patient much sooner if necessary.   Lauraine Pepper, NP

## 2023-12-10 ENCOUNTER — Ambulatory Visit

## 2023-12-10 LAB — HOMOCYSTEINE: Homocysteine: 25.7 umol/L — ABNORMAL HIGH (ref 0.0–17.2)

## 2023-12-10 LAB — DRVVT CONFIRM: dRVVT Confirm: 1 ratio (ref 0.8–1.2)

## 2023-12-10 LAB — BETA-2-GLYCOPROTEIN I ABS, IGG/M/A
Beta-2 Glyco I IgG: <9 GPI IgG units (ref 0–20)
Beta-2-Glycoprotein I IgA: <9 GPI IgA units (ref 0–25)
Beta-2-Glycoprotein I IgM: <9 GPI IgM units (ref 0–32)

## 2023-12-10 LAB — LUPUS ANTICOAGULANT PANEL
DRVVT: 58.8 s — ABNORMAL HIGH (ref 0.0–47.0)
PTT Lupus Anticoagulant: 29.9 s (ref 0.0–43.5)

## 2023-12-10 LAB — PROTEIN S ACTIVITY: Protein S Activity: 225 % — ABNORMAL HIGH (ref 63–140)

## 2023-12-10 LAB — PROTEIN S, TOTAL: Protein S Ag, Total: 190 % — ABNORMAL HIGH (ref 60–150)

## 2023-12-10 LAB — DRVVT MIX: dRVVT Mix: 47.8 s — ABNORMAL HIGH (ref 0.0–40.4)

## 2023-12-10 LAB — PROTEIN C ACTIVITY: Protein C Activity: 159 % (ref 73–180)

## 2023-12-11 ENCOUNTER — Ambulatory Visit: Attending: Internal Medicine | Admitting: Rehabilitation

## 2023-12-11 ENCOUNTER — Encounter: Payer: Self-pay | Admitting: Rehabilitation

## 2023-12-11 ENCOUNTER — Other Ambulatory Visit: Payer: Self-pay

## 2023-12-11 DIAGNOSIS — R2689 Other abnormalities of gait and mobility: Secondary | ICD-10-CM | POA: Insufficient documentation

## 2023-12-11 DIAGNOSIS — M25551 Pain in right hip: Secondary | ICD-10-CM | POA: Insufficient documentation

## 2023-12-11 DIAGNOSIS — M25552 Pain in left hip: Secondary | ICD-10-CM | POA: Diagnosis present

## 2023-12-11 DIAGNOSIS — M6281 Muscle weakness (generalized): Secondary | ICD-10-CM | POA: Insufficient documentation

## 2023-12-11 LAB — FACTOR 5 LEIDEN

## 2023-12-11 NOTE — Therapy (Signed)
 OUTPATIENT PHYSICAL THERAPY THORACOLUMBAR EVALUATION   Patient Name: Kyle Delacruz MRN: [id number] DOB:[birth year], 68 y.o., male Today's Date: 12/11/2023  END OF SESSION:  PT End of Session - 12/11/23 1404     Visit Number 1    Date for PT Re-Evaluation 02/05/24    Authorization Type [employer]/ 75 visit limit PT/OT/SLP    PT Start Time 1405    PT Stop Time 1450    PT Time Calculation (min) 45 min    Activity Tolerance Patient tolerated treatment well    Behavior During Therapy WFL for tasks assessed/performed          Past Medical History:  Diagnosis Date   Allergy    Arthritis    osteo   CTS (carpal tunnel syndrome)    DJD (degenerative joint disease)    DM2 (diabetes mellitus, type 2) (HCC)    DVT (deep venous thrombosis) (HCC)    GERD (gastroesophageal reflux disease)    Gout    History of adenomatous polyps of colon 12/29/2019   History of nuclear stress test    Myoview  11/17: EF 54, no ST changes, hypertensive blood pressure response, no ischemia, low risk   Hyperlipidemia    Hypothyroidism    Nicotine addiction    OSA (obstructive sleep apnea)    Prostatitis    Reactive airway disease    Sarcocystosis    Sinus arrhythmia    Sleep apnea    cpap- not wearing   Tinea corporis    Past Surgical History:  Procedure Laterality Date   BILATERAL KNEE ARTHROSCOPY     right- 1997; left 2001   COLONOSCOPY  multiple last 2013   IVC FILTER INSERTION N/A 09/17/2022   Procedure: IVC FILTER INSERTION;  Surgeon: Sheree Penne Bruckner, MD;  Location: [facility] INVASIVE CV LAB;  Service: Cardiovascular;  Laterality: N/A;   IVC FILTER REMOVAL N/A 01/28/2023   Procedure: IVC FILTER REMOVAL;  Surgeon: Sheree Penne Bruckner, MD;  Location: [facility] INVASIVE CV LAB;  Service: Cardiovascular;  Laterality: N/A;   KNEE SURGERY Right 2014   torn R medial meniscus- arthroscopy done   LAPAROTOMY N/A 04/10/2022   Procedure: EXPLORATORY LAPAROTOMY, TOTAL COLECTOMY, END ILEOSTOMY;   Surgeon: Lyndel Deward PARAS, MD;  Location: [facility];  Service: General;  Laterality: N/A;   PARATHYROIDECTOMY  2005   ROTATOR CUFF REPAIR  2004   right   TONSILLECTOMY     TOTAL HIP ARTHROPLASTY Left 2009   TOTAL HIP ARTHROPLASTY Right 2006   UPPER GASTROINTESTINAL ENDOSCOPY     Patient Active Problem List   Diagnosis Date Noted   History of pulmonary embolism 06/28/2022   History of DVT (deep vein thrombosis) 06/28/2022   Lumbar radiculopathy, chronic 06/28/2022   Nausea and vomiting 06/28/2022   Septic shock (HCC) 06/26/2022   SVT (supraventricular tachycardia) (HCC) 06/19/2022   PAF (paroxysmal atrial fibrillation) (HCC) 06/18/2022   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   High output ileostomy (HCC) 04/21/2022   Chronic anticoagulation 04/21/2022   Normocytic anemia 04/10/2022   Shock (HCC) 04/10/2022   Deep vein thrombosis (DVT) of femoral vein of right lower extremity (HCC) 04/09/2022   Leg DVT (deep venous thromboembolism), chronic, right (HCC) 04/08/2022   Abdominal distension 04/08/2022   Acute respiratory failure with hypoxia (HCC) 07/27/2021   PE (pulmonary thromboembolism) (HCC) 07/26/2021   Carcinoma of prostate (HCC) 07/10/2021   Chronic obstructive pulmonary disease (HCC) 07/10/2021   HLD (hyperlipidemia) 03/10/2021   Hypothyroidism 03/10/2021   Chronic bilateral back pain 03/10/2021  History of adenomatous polyps of colon 12/29/2019   Essential hypertension 06/13/2011   Obstructive sleep apnea (adult) (pediatric) 06/13/2011   Gastro-esophageal reflux disease without esophagitis 05/02/2009    PCP: Jason Leita Repine, FNP   REFERRING PROVIDER: Moffo, Katelyn Rose, [place]*   REFERRING DIAG: [phone] (ICD-10-CM) - Fusion of spine, lumbar region M25.551 (ICD-10-CM) - Pain in right hip M25.552 (ICD-10-CM) - Pain in left hip  THERAPY DIAG:  Pain in left hip  Pain in right hip  Other abnormalities of gait and mobility  Muscle weakness (generalized)  RATIONALE FOR  EVALUATION AND TREATMENT: Rehabilitation  ONSET DATE: 1 month ago  NEXT MD VISIT: unknown   SUBJECTIVE:                                                                                                                                                                                                         SUBJECTIVE STATEMENT: 68 y/o male referred to PT from spine surgeon at [facility] for B hip pain and muscular back pain.  Patient Has had a lot of surgeries starting with original back surgery on 04/04/22, developed PE on 04/08/22, then developed sepsis on 06/26/22, had increased back pain starting in March 2024, cage in back came loose, had hardware removed and IVC on 11/05/22.   Came here for PT and was just d/c in 3/25.    States didn't keep doing his home exercises.  States started having intermittent bilateral hip pain about 1 month ago for no apparent reason.   States pain is R posterior hip and L flank pain along the iliac crest.   States he had injections into the R buttock and L hip last Friday by Lloyd Beers and they are helping some.  States pain has caused him to have to start using his cane again. States was going to go to [employer] with family and do some pool exercise after last episode of care with our clinic, but never followed through.  Really can't give a reason for it, other than just didn't do it.  States hasn't been doing his HEP.   States he wants to be able to help out with [employer] football this fall and wants to be able to walk better  PAIN: Are you having pain? 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 No  LIVING ENVIRONMENT: Lives with: lives with their family Lives in: House/apartment Stairs: Yes: External: 6 steps; can reach both Has following equipment at home: Single point cane  OCCUPATION: retired Sports coach  PLOF: Independent with gait  PATIENT GOALS: be able to stand up straight, not hurt   OBJECTIVE: (objective measures completed at initial evaluation unless otherwise dated)  DIAGNOSTIC FINDINGS:  11/06/22 XR Lumbar spine IMPRESSION:  1. L2-L5 posterior lumbar fusion with instrumentation and anterior  lumbar discectomy. Stable alignment.  PATIENT SURVEYS:   ODI = 15/50 LEFS = 31-80  SCREENING FOR RED FLAGS: Bowel or bladder incontinence: No Spinal tumors: No Cauda equina syndrome: No Compression fracture: No Abdominal aneurysm: No  COGNITION:  Overall cognitive status: Within functional limits for tasks assessed    SENSATION: WFL  POSTURE:  Flexed at the back and hips 30 degrees  PALPATION: TTP over the L Quadratus lumborum and along the flank above the iliac crest;  some TTP over the R piriformis  LUMBAR ROM:   Active  Eval  Flexion Mid shin; ham pull  Extension -20 deg; p!  Right lateral flexion To mild thigh  Left lateral flexion To mid thigh; L flank p!  Right rotation 30% ROM  Left rotation 20% ROM  (Blank rows = not tested)  MUSCLE LENGTH: Hamstrings: Right SLR 74 deg; Left 80 deg Thomas test: Right -20 deg; Left -15 deg Hamstrings: 90/90 RLE = -35;  LLE = -20  LOWER EXTREMITY ROM:     Active  Right eval Left eval  Hip flexion    Hip extension -15 -10  Hip abduction    Hip adduction    Hip internal rotation 15 10  Hip external rotation    Knee flexion    Knee extension -5 -3  Ankle dorsiflexion    Ankle plantarflexion    Ankle inversion    Ankle eversion    (Blank rows = not tested)  LOWER EXTREMITY MMT:     MMT Right eval Left eval  Hip flexion 4 4-  Hip extension    Hip abduction 4+ 4+  Hip adduction    Hip internal rotation    Hip external rotation 4+ 4  Knee flexion 4 4  Knee extension 5 5  Ankle dorsiflexion 4- 2  Ankle plantarflexion    Ankle inversion    Ankle eversion     (Blank rows = not tested)  LUMBAR SPECIAL TESTS:  Straight leg raise test: Negative, Slump test: Negative, Gaenslen's test: Negative, and Thomas test: Negative  FUNCTIONAL TESTS:  DGI = 14 5x STS = 14.93 sec GAIT: Distance walked: 200 feet into clinic Assistive device utilized: Single point cane Level of assistance: Complete Independence Gait pattern: decreased arm swing- Right, decreased arm swing- Left, decreased step length- Right, decreased step length- Left, decreased stance time- Right, decreased stance time- Left, decreased stride length, knee flexed in stance- Right, knee flexed in stance- Left, and poor foot clearance- Left Comments: flexed posture, flexed hips, short shuffling gait LLE 12.43 sec/6.7m = .49 m/s   TODAY'S TREATMENT:  12/11/23 SELF CARE: Provided education to improve safety with use of SPC for assistive device, to reduce fall risk, and to prevent loss of gains achieved with physical therapyy, initial HEP instruction  PATIENT EDUCATION:  Education details: PT eval findings, anticipated POC, and initial HEP  Person educated: Patient Education method: Explanation, Demonstration, Verbal cues, Tactile cues, and MedBridgeGO app access provided Education comprehension: verbalized understanding, verbal cues required, and tactile cues required  HOME EXERCISE PROGRAM: Access Code: [facility] URL: [email or web address] Date: 12/11/2023 Prepared by: Garnette Montclair  Exercises - Hip Flexor Stretch at [facility] of Bed  - 1 x daily - 7 x weekly - 1 sets - 2 reps - 1 min hold - Supine Lower Trunk Rotation  - 1 x daily - 7 x weekly - 3 sets - 10 reps - Seated Quadratus  Lumborum Stretch in Chair  - 1 x daily - 7 x weekly - 1 sets - 2 reps - 1 min hold - Wall Squat  - 1 x daily - 7 x weekly - 3 sets - 10 reps   ASSESSMENT:  CLINICAL IMPRESSION: Kyle Delacruz is a 68 y.o. male who was referred to physical therapy for evaluation and treatment for LBP and B hip pain.  HE is well known to us  from recent episode of care ending in 3/25.   He was given a comprehensive home exercise program at that time and unfortunately did not continue with it.   We recommended that he go to the pool as well, which he did not do.   He is c/o increased hip pain BLE and not so much back pain.     Patient reports onset of B hip pain beginning 1 month ago. Pain is worse with bending/lifting.  He has significant foot neuropathy with inability to do much dorsiflexion on the LLE.   HE also has flexed posture with hip and back flexion contractures.  Patient has deficits in B hip ROM, B LE flexibility, BLE strength, abnormal posture, and TTP with abnormal muscle tension the L quadratus lumborum which are interfering with ADLs and are impacting quality of life.  On Modified Oswestry patient scored 15/50 demonstrating 30% disability.  Kyle Delacruz will benefit from skilled PT to address above deficits to improve mobility and activity tolerance with decreased pain interference.     OBJECTIVE IMPAIRMENTS: Abnormal gait, decreased balance, decreased mobility, difficulty walking, decreased ROM, decreased strength, impaired flexibility, impaired sensation, and pain.   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   REHAB  POTENTIAL: Good  CLINICAL DECISION MAKING: Evolving/moderate complexity  EVALUATION COMPLEXITY: Moderate   GOALS: Goals reviewed with patient? Yes  SHORT TERM GOALS: Target date:  01/08/2024  Patient will be independent with initial HEP to improve outcomes and carryover.  Baseline: assist required for all HEP Goal status: INITIAL  2.  Patient will report 25% improvement in low back pain to improve QOL. Baseline: 3/10 Goal status: INITIAL  3.  Patient will improve in gait speed to 0.9 m/s to reduce fall risk Baseline: 0.49 m/s Goal status: INITIAL   LONG TERM GOALS: Target date: 02/05/2024  Patient will be independent with ongoing/advanced HEP for self-management at home.  Baseline: no advanced HEP Goal status: INITIAL  2.  Patient will report 50-75% improvement in low back pain to improve QOL.  Baseline: 3/10 Goal status: INITIAL  3.  Patient to demonstrate ability to  achieve and maintain good spinal alignment/posturing and body mechanics needed for daily activities. Baseline: -30 degrees back extension Goal status: INITIAL  4.  Patient will demonstrate -10 degrees lumbar extension to perform ADLs.   Baseline: Refer to above lumbar ROM table Goal status: INITIAL  5.  Patient will demonstrate improved BLE strength to >/= 4 to 4+/5 for improved stability and ease of mobility. Baseline: Refer to above LE MMT table Goal status: INITIAL  6. Patient will report </= 2% on Modified Oswestry (MCID = 12%) , and to 50/80 on LEFS to demonstrate improved functional ability with decreased pain interference. Baseline: 2/50 ODI;  31/80 LEFS Goal status: INITIAL  7.  Patient will tolerate 30 min of (standing/sitting/walking) w/o increased pain to allow for  improved mobility and activity tolerance. Baseline: 10 Goal status: INITIAL  PLAN:  PT FREQUENCY: 1-2x/week  PT DURATION: 8 weeks  PLANNED INTERVENTIONS: 97164- PT Re-evaluation, 97750- Physical Performance Testing,  97110-Therapeutic exercises, 97530- Therapeutic activity, [id number]- Neuromuscular re-education, 97535- Self Care, [id number]- Manual therapy, [phone]- Gait training, [phone]- Electrical stimulation (unattended), 97016- Vasopneumatic device, [id number]- Ultrasound, [id number]- Ionotophoresis 4mg /ml Dexamethasone , [id number] (1-2 muscles), [id number] (3+ muscles)- Dry Needling, Patient/Family education, Balance training, Stair training, Taping, Joint mobilization, Cryotherapy, and Moist heat  PLAN FOR NEXT SESSION: Progress stretching and strengthening HEP for lumbar spine and B hips/knees;  MFR to L quadratus PRN   Janayia Burggraf, PT 12/11/2023, 9:01 PM

## 2023-12-12 LAB — PROTEIN C, TOTAL: Protein C, Total: 130 % (ref 60–150)

## 2023-12-12 LAB — CARDIOLIPIN ANTIBODIES, IGG, IGM, IGA
Anticardiolipin IgA: <9 U/mL (ref 0–11)
Anticardiolipin IgG: <9 GPL U/mL (ref 0–14)
Anticardiolipin IgM: <9 [MPL'U]/mL (ref 0–12)

## 2023-12-12 LAB — HEMOCHROMATOSIS DNA-PCR(C282Y,H63D)

## 2023-12-13 LAB — PROTHROMBIN GENE MUTATION

## 2023-12-16 DIAGNOSIS — E785 Hyperlipidemia, unspecified: Secondary | ICD-10-CM | POA: Diagnosis not present

## 2023-12-16 DIAGNOSIS — R7989 Other specified abnormal findings of blood chemistry: Secondary | ICD-10-CM | POA: Diagnosis not present

## 2023-12-16 DIAGNOSIS — C61 Malignant neoplasm of prostate: Secondary | ICD-10-CM | POA: Diagnosis not present

## 2023-12-18 ENCOUNTER — Ambulatory Visit: Payer: Self-pay

## 2023-12-18 ENCOUNTER — Other Ambulatory Visit: Payer: Self-pay | Admitting: Family

## 2023-12-18 ENCOUNTER — Other Ambulatory Visit (HOSPITAL_COMMUNITY): Payer: Self-pay

## 2023-12-18 DIAGNOSIS — E7211 Homocystinuria: Secondary | ICD-10-CM

## 2023-12-18 DIAGNOSIS — I2699 Other pulmonary embolism without acute cor pulmonale: Secondary | ICD-10-CM

## 2023-12-18 DIAGNOSIS — I82411 Acute embolism and thrombosis of right femoral vein: Secondary | ICD-10-CM

## 2023-12-18 MED ORDER — FOLIC ACID 1 MG PO TABS
1.0000 mg | ORAL_TABLET | Freq: Every day | ORAL | 11 refills | Status: AC
  Filled 2023-12-18: qty 30, 30d supply, fill #0

## 2023-12-19 ENCOUNTER — Ambulatory Visit: Payer: Self-pay | Admitting: Allergy & Immunology

## 2023-12-23 ENCOUNTER — Encounter: Payer: Self-pay | Admitting: Rehabilitation

## 2023-12-23 ENCOUNTER — Ambulatory Visit: Attending: Internal Medicine | Admitting: Rehabilitation

## 2023-12-23 DIAGNOSIS — M25551 Pain in right hip: Secondary | ICD-10-CM | POA: Insufficient documentation

## 2023-12-23 DIAGNOSIS — M6281 Muscle weakness (generalized): Secondary | ICD-10-CM | POA: Insufficient documentation

## 2023-12-23 DIAGNOSIS — M5459 Other low back pain: Secondary | ICD-10-CM | POA: Diagnosis not present

## 2023-12-23 DIAGNOSIS — R2689 Other abnormalities of gait and mobility: Secondary | ICD-10-CM | POA: Insufficient documentation

## 2023-12-23 DIAGNOSIS — M25552 Pain in left hip: Secondary | ICD-10-CM | POA: Diagnosis not present

## 2023-12-23 NOTE — Therapy (Signed)
 OUTPATIENT PHYSICAL THERAPY THORACOLUMBAR EVALUATION   Patient Name: Kyle Delacruz MRN: [id number] DOB:04/25/1956, 67 y.o., male Today's Date: 12/23/2023  END OF SESSION:  PT End of Session - 12/23/23 1317     Visit Number 2    Date for PT Re-Evaluation 02/05/24    Authorization Type [employer]/ 75 visit limit PT/OT/SLP    PT Start Time 1315    PT Stop Time 1405    PT Time Calculation (min) 50 min    Activity Tolerance Patient tolerated treatment well    Behavior During Therapy WFL for tasks assessed/performed          Past Medical History:  Diagnosis Date   Allergy    Arthritis    osteo   CTS (carpal tunnel syndrome)    DJD (degenerative joint disease)    DM2 (diabetes mellitus, type 2) (HCC)    DVT (deep venous thrombosis) (HCC)    GERD (gastroesophageal reflux disease)    Gout    History of adenomatous polyps of colon 12/29/2019   History of nuclear stress test    Myoview  11/17: EF 54, no ST changes, hypertensive blood pressure response, no ischemia, low risk   Hyperlipidemia    Hypothyroidism    Nicotine addiction    OSA (obstructive sleep apnea)    Prostatitis    Reactive airway disease    Sarcocystosis    Sinus arrhythmia    Sleep apnea    cpap- not wearing   Tinea corporis    Past Surgical History:  Procedure Laterality Date   BILATERAL KNEE ARTHROSCOPY     right- 1997; left 2001   COLONOSCOPY  multiple last 2013   IVC FILTER INSERTION N/A 09/17/2022   Procedure: IVC FILTER INSERTION;  Surgeon: Sheree Penne Bruckner, MD;  Location: [facility] INVASIVE CV LAB;  Service: Cardiovascular;  Laterality: N/A;   IVC FILTER REMOVAL N/A 01/28/2023   Procedure: IVC FILTER REMOVAL;  Surgeon: Sheree Penne Bruckner, MD;  Location: [facility] INVASIVE CV LAB;  Service: Cardiovascular;  Laterality: N/A;   KNEE SURGERY Right 2014   torn R medial meniscus- arthroscopy done   LAPAROTOMY N/A 04/10/2022   Procedure: EXPLORATORY LAPAROTOMY, TOTAL COLECTOMY, END ILEOSTOMY;   Surgeon: Lyndel Deward PARAS, MD;  Location: [facility];  Service: General;  Laterality: N/A;   PARATHYROIDECTOMY  2005   ROTATOR CUFF REPAIR  2004   right   TONSILLECTOMY     TOTAL HIP ARTHROPLASTY Left 2009   TOTAL HIP ARTHROPLASTY Right 2006   UPPER GASTROINTESTINAL ENDOSCOPY     Patient Active Problem List   Diagnosis Date Noted   History of pulmonary embolism 06/28/2022   History of DVT (deep vein thrombosis) 06/28/2022   Lumbar radiculopathy, chronic 06/28/2022   Nausea and vomiting 06/28/2022   Septic shock (HCC) 06/26/2022   SVT (supraventricular tachycardia) (HCC) 06/19/2022   PAF (paroxysmal atrial fibrillation) (HCC) 06/18/2022   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   High output ileostomy (HCC) 04/21/2022   Chronic anticoagulation 04/21/2022   Normocytic anemia 04/10/2022   Shock (HCC) 04/10/2022   Deep vein thrombosis (DVT) of femoral vein of right lower extremity (HCC) 04/09/2022   Leg DVT (deep venous thromboembolism), chronic, right (HCC) 04/08/2022   Abdominal distension 04/08/2022   Acute respiratory failure with hypoxia (HCC) 07/27/2021   PE (pulmonary thromboembolism) (HCC) 07/26/2021   Carcinoma of prostate (HCC) 07/10/2021   Chronic obstructive pulmonary disease (HCC) 07/10/2021   HLD (hyperlipidemia) 03/10/2021   Hypothyroidism 03/10/2021   Chronic bilateral back pain 03/10/2021  History of adenomatous polyps of colon 12/29/2019   Essential hypertension 06/13/2011   Obstructive sleep apnea (adult) (pediatric) 06/13/2011   Gastro-esophageal reflux disease without esophagitis 05/02/2009    PCP: Jason Leita Repine, FNP   REFERRING PROVIDER: Moffo, Katelyn Rose, [place]*   REFERRING DIAG: [phone] (ICD-10-CM) - Fusion of spine, lumbar region M25.551 (ICD-10-CM) - Pain in right hip M25.552 (ICD-10-CM) - Pain in left hip  THERAPY DIAG:  Pain in left hip  Pain in right hip  Other abnormalities of gait and mobility  Muscle weakness (generalized)  RATIONALE FOR  EVALUATION AND TREATMENT: Rehabilitation  ONSET DATE: 1 month ago  NEXT MD VISIT: unknown   SUBJECTIVE:                                                                                                                                                                                                         SUBJECTIVE STATEMENT: 12/23/23:  Patient reports he is doing his HEP and going to [facility] getting in the pool now.    68 y/o male referred to PT from spine surgeon at [facility] for B hip pain and muscular back pain.  Patient Has had a lot of surgeries starting with original back surgery on 04/04/22, developed PE on 04/08/22, then developed sepsis on 06/26/22, had increased back pain starting in March 2024, cage in back came loose, had hardware removed and IVC on 11/05/22.   Came here for PT and was just d/c in 3/25.    States didn't keep doing his home exercises.  States started having intermittent bilateral hip pain about 1 month ago for no apparent reason.   States pain is R posterior hip and L flank pain along the iliac crest.   States he had injections into the R buttock and L hip last Friday by Lloyd Beers and they are helping some.  States pain has caused him to have to start using his cane again. States was going to go to [employer] with family and do some pool exercise after last episode of care with our clinic, but never followed through.  Really can't give a reason for it, other than just didn't do it.  States hasn't been doing his HEP.   States he wants to be able to help out with [employer] football this fall and wants to be able to walk better  PAIN: Are you having pain? 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 No  LIVING ENVIRONMENT: Lives with: lives with their family Lives in: House/apartment Stairs: Yes: External: 6 steps; can reach both Has following equipment at home: Single point cane  OCCUPATION: retired Sports coach  PLOF: Independent with gait  PATIENT GOALS: be able to stand up straight, not hurt   OBJECTIVE: (objective measures completed at initial evaluation unless otherwise dated)  DIAGNOSTIC FINDINGS:  11/06/22 XR Lumbar spine IMPRESSION:  1. L2-L5 posterior lumbar fusion with instrumentation and anterior  lumbar discectomy. Stable alignment.  PATIENT SURVEYS:   ODI = 15/50 LEFS = 31-80  SCREENING FOR RED FLAGS: Bowel or bladder incontinence: No Spinal tumors: No Cauda equina syndrome: No Compression fracture: No Abdominal aneurysm: No  COGNITION:  Overall cognitive status: Within functional limits for tasks assessed    SENSATION: WFL  POSTURE:  Flexed at the back and hips 30 degrees  PALPATION: TTP over the L Quadratus lumborum and along the flank above the iliac crest;  some TTP over the R piriformis  LUMBAR ROM:   Active  Eval  Flexion Mid shin; ham pull  Extension -20 deg; p!  Right lateral flexion To mild thigh  Left lateral flexion To mid thigh; L flank p!  Right rotation 30% ROM  Left rotation 20% ROM  (Blank rows = not tested)  MUSCLE LENGTH: Hamstrings: Right SLR 74 deg; Left 80 deg Thomas test: Right -20 deg; Left -15 deg Hamstrings: 90/90 RLE = -35;  LLE = -20  LOWER EXTREMITY ROM:     Active  Right eval Left eval  Hip flexion    Hip extension -15 -10  Hip abduction    Hip adduction    Hip internal rotation 15 10  Hip external rotation    Knee flexion    Knee extension -5 -3  Ankle dorsiflexion    Ankle  plantarflexion    Ankle inversion    Ankle eversion    (Blank rows = not tested)  LOWER EXTREMITY MMT:    MMT Right eval Left eval  Hip flexion 4 4-  Hip extension    Hip abduction 4+ 4+  Hip adduction    Hip internal rotation    Hip external rotation 4+ 4  Knee flexion 4 4  Knee extension 5 5  Ankle dorsiflexion 4- 2  Ankle plantarflexion    Ankle inversion    Ankle eversion     (Blank rows = not tested)  LUMBAR SPECIAL TESTS:  Straight leg raise test: Negative, Slump test: Negative, Gaenslen's test: Negative, and Thomas test: Negative  FUNCTIONAL TESTS:  DGI = 14 5x STS = 14.93 sec GAIT: Distance walked: 200 feet into clinic Assistive device utilized: Single point cane Level of assistance: Complete Independence Gait pattern: decreased arm swing- Right, decreased arm swing- Left, decreased step length- Right, decreased step length- Left, decreased stance time- Right, decreased stance time- Left, decreased stride length, knee flexed in stance- Right, knee flexed in stance- Left, and poor foot clearance- Left Comments: flexed posture, flexed hips, short shuffling gait LLE 12.43 sec/6.12m = .49 m/s   TODAY'S TREATMENT:  12/23/23 THERAPEUTIC EXERCISE: To improve strength.  Demonstration, verbal and tactile  cues throughout for technique. NuStep L4 x 8'  THERAPEUTIC ACTIVITIES: To improve functional performance.  Demonstration, verbal and tactile cues throughout for technique. Seated hip flexor stretch x 1' x 2 BLE Seated ham stretch x 1' x 2 BLE Seated R SB/L Quadratus stretch x 1'  Doorway pec stretch Standing counter back extension stretch x 1' Standing hip ext x 15 BLE Supine bridging x 20 Supine LTR x 20 Supine hip flexor stretch x 1' RLE  NEUROMUSCULAR RE-EDUCATION: To improve balance. Forward lunges x 10 BLE Lateral lunges x 10 BLE Sidestepping GTB x 3 laps  MANUAL THERAPY: To promote reduced pain utilizing myofascial release. L Quadratus MFR in partial R SB  over bolster/leg prop  12/11/23 SELF CARE: Provided education to improve safety with use of SPC for assistive device, to reduce fall risk, and to prevent loss of gains achieved with physical therapyy, initial HEP instruction  PATIENT EDUCATION:  Education details: HEP review and HEP update  Person educated: Patient Education method: Explanation, Demonstration, Verbal cues, Tactile cues, and MedBridgeGO app access provided Education comprehension: verbalized understanding, verbal cues required, and tactile cues required  HOME EXERCISE PROGRAM: Access Code: [facility] URL: [email or web address] Date: 12/11/2023 Prepared by: Garnette Montclair  Exercises - Hip Flexor Stretch at [facility] of Bed  - 1 x daily - 7 x weekly - 1 sets - 2 reps - 1 min hold - Supine Lower Trunk Rotation  - 1 x daily - 7 x weekly - 3 sets - 10 reps - Seated Quadratus Lumborum Stretch in Chair  - 1 x daily - 7 x weekly - 1 sets - 2 reps - 1 min hold - Wall Squat  - 1 x daily - 7 x weekly - 3 sets - 10 reps   ASSESSMENT:  CLINICAL IMPRESSION: 12/23/23:  Patient remains very stiff with his hip flexors and back limiting him from upright posture.  [facility].His HEP is reviewed today and updated.  He tolerates progression well.  His medbridge Go app is updated.  PT remains necessary for HEP progression, ROM deficits, balance/postural deficits.  Continue per POC  EVAL:  Kyle Delacruz is a 68 y.o. male who was referred to physical therapy for evaluation and treatment for LBP and B hip pain.  HE is well known to us  from recent episode of care ending in 3/25.   He was given a comprehensive home exercise program at that time and unfortunately did not continue with it.   We recommended that he go to the pool as well, which he did not do.   He is c/o increased hip pain BLE and not so much back pain.     Patient reports onset of B hip pain beginning 1 month ago. Pain is  worse with bending/lifting.  He has significant foot neuropathy with inability to do much dorsiflexion on the LLE.   HE also has flexed posture with hip and back flexion contractures.  Patient has deficits in B hip ROM, B LE flexibility, BLE strength, abnormal posture, and TTP with abnormal muscle tension the L quadratus lumborum which are interfering with ADLs and are impacting quality of life.  On Modified Oswestry patient scored 15/50 demonstrating 30% disability.  Rodel will benefit from skilled PT to address above deficits to improve mobility and activity tolerance with decreased pain interference.     OBJECTIVE IMPAIRMENTS: Abnormal gait, decreased balance, decreased mobility, difficulty walking, decreased ROM, decreased strength,  impaired flexibility, impaired sensation, and pain.   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   REHAB POTENTIAL: Good  CLINICAL DECISION MAKING: Evolving/moderate complexity  EVALUATION COMPLEXITY: Moderate   GOALS: Goals reviewed with patient? Yes  SHORT TERM GOALS: Target date:  01/08/2024  Patient will be independent with initial HEP to improve outcomes and carryover.  Baseline: assist required for all HEP 12/23/23:  Can teach back all HEP Goal status: MET  2.  Patient will report 25% improvement in low back pain to improve QOL. Baseline: 3/10 Goal status: INITIAL  3.  Patient will improve in gait speed to 0.9 m/s to reduce fall risk Baseline: 0.49 m/s Goal status: INITIAL   LONG TERM GOALS: Target date: 02/05/2024  Patient will be independent with  ongoing/advanced HEP for self-management at home.  Baseline: no advanced HEP Goal status: INITIAL  2.  Patient will report 50-75% improvement in low back pain to improve QOL.  Baseline: 3/10 Goal status: INITIAL  3.  Patient to demonstrate ability to achieve and maintain good spinal alignment/posturing and body mechanics needed for daily activities. Baseline: -30 degrees back extension Goal status: INITIAL  4.  Patient will demonstrate -10 degrees lumbar extension to perform ADLs.   Baseline: Refer to above lumbar ROM table Goal status: INITIAL  5.  Patient will demonstrate improved BLE strength to >/= 4 to 4+/5 for improved stability and ease of mobility. Baseline: Refer to above LE MMT table Goal status: INITIAL  6. Patient will report </= 2% on Modified Oswestry (MCID = 12%) , and to 50/80 on LEFS to demonstrate improved functional ability with decreased pain interference. Baseline: 2/50 ODI;  31/80 LEFS Goal status: INITIAL  7.  Patient will tolerate 30 min of (standing/sitting/walking) w/o increased pain to allow for  improved mobility and activity tolerance. Baseline: 10 Goal status: INITIAL  PLAN:  PT FREQUENCY: 1-2x/week  PT DURATION: 8 weeks  PLANNED INTERVENTIONS: 02835- PT Re-evaluation, 97750- Physical Performance Testing, 97110-Therapeutic exercises, 97530- Therapeutic activity, [id number]- Neuromuscular re-education, 97535- Self Care, 02859- Manual therapy, [phone]- Gait training, [phone]- Electrical stimulation (unattended), 97016- Vasopneumatic device, [id number]- Ultrasound, [id number]- Ionotophoresis 4mg /ml Dexamethasone , 79439 (1-2 muscles), 20561 (3+ muscles)- Dry Needling, Patient/Family education, Balance training, Stair training, Taping, Joint mobilization, Cryotherapy, and Moist heat  PLAN FOR NEXT SESSION: Assess if MFR to L quadratus helped any;  add balance exercise with postural strength components; single leg wall pushups, etc   Hazyl Marseille, PT 12/23/2023, 3:36  PM

## 2023-12-25 ENCOUNTER — Ambulatory Visit: Admitting: Rehabilitation

## 2023-12-25 DIAGNOSIS — M25552 Pain in left hip: Secondary | ICD-10-CM | POA: Diagnosis not present

## 2023-12-25 DIAGNOSIS — M6281 Muscle weakness (generalized): Secondary | ICD-10-CM

## 2023-12-25 DIAGNOSIS — R2689 Other abnormalities of gait and mobility: Secondary | ICD-10-CM

## 2023-12-25 DIAGNOSIS — M25551 Pain in right hip: Secondary | ICD-10-CM

## 2023-12-25 NOTE — Therapy (Signed)
 OUTPATIENT PHYSICAL THERAPY THORACOLUMBAR EVALUATION   Patient Name: Kyle Delacruz MRN: [id number] DOB:27-Dec-1955, 68 y.o., male Today's Date: 12/25/2023  END OF SESSION:  PT End of Session - 12/25/23 1317     Visit Number 3    Date for PT Re-Evaluation 02/05/24    Authorization Type [employer]/ 75 visit limit PT/OT/SLP    PT Start Time 1315    PT Stop Time 1403    PT Time Calculation (min) 48 min    Activity Tolerance Patient tolerated treatment well    Behavior During Therapy WFL for tasks assessed/performed          Past Medical History:  Diagnosis Date   Allergy    Arthritis    osteo   CTS (carpal tunnel syndrome)    DJD (degenerative joint disease)    DM2 (diabetes mellitus, type 2) (HCC)    DVT (deep venous thrombosis) (HCC)    GERD (gastroesophageal reflux disease)    Gout    History of adenomatous polyps of colon 12/29/2019   History of nuclear stress test    Myoview  11/17: EF 54, no ST changes, hypertensive blood pressure response, no ischemia, low risk   Hyperlipidemia    Hypothyroidism    Nicotine addiction    OSA (obstructive sleep apnea)    Prostatitis    Reactive airway disease    Sarcocystosis    Sinus arrhythmia    Sleep apnea    cpap- not wearing   Tinea corporis    Past Surgical History:  Procedure Laterality Date   BILATERAL KNEE ARTHROSCOPY     right- 1997; left 2001   COLONOSCOPY  multiple last 2013   IVC FILTER INSERTION N/A 09/17/2022   Procedure: IVC FILTER INSERTION;  Surgeon: Sheree Penne Bruckner, MD;  Location: [facility] INVASIVE CV LAB;  Service: Cardiovascular;  Laterality: N/A;   IVC FILTER REMOVAL N/A 01/28/2023   Procedure: IVC FILTER REMOVAL;  Surgeon: Sheree Penne Bruckner, MD;  Location: [facility] INVASIVE CV LAB;  Service: Cardiovascular;  Laterality: N/A;   KNEE SURGERY Right 2014   torn R medial meniscus- arthroscopy done   LAPAROTOMY N/A 04/10/2022   Procedure: EXPLORATORY LAPAROTOMY, TOTAL COLECTOMY, END ILEOSTOMY;   Surgeon: Lyndel Deward PARAS, MD;  Location: [facility];  Service: General;  Laterality: N/A;   PARATHYROIDECTOMY  2005   ROTATOR CUFF REPAIR  2004   right   TONSILLECTOMY     TOTAL HIP ARTHROPLASTY Left 2009   TOTAL HIP ARTHROPLASTY Right 2006   UPPER GASTROINTESTINAL ENDOSCOPY     Patient Active Problem List   Diagnosis Date Noted   History of pulmonary embolism 06/28/2022   History of DVT (deep vein thrombosis) 06/28/2022   Lumbar radiculopathy, chronic 06/28/2022   Nausea and vomiting 06/28/2022   Septic shock (HCC) 06/26/2022   SVT (supraventricular tachycardia) (HCC) 06/19/2022   PAF (paroxysmal atrial fibrillation) (HCC) 06/18/2022   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   High output ileostomy (HCC) 04/21/2022   Chronic anticoagulation 04/21/2022   Normocytic anemia 04/10/2022   Shock (HCC) 04/10/2022   Deep vein thrombosis (DVT) of femoral vein of right lower extremity (HCC) 04/09/2022   Leg DVT (deep venous thromboembolism), chronic, right (HCC) 04/08/2022   Abdominal distension 04/08/2022   Acute respiratory failure with hypoxia (HCC) 07/27/2021   PE (pulmonary thromboembolism) (HCC) 07/26/2021   Carcinoma of prostate (HCC) 07/10/2021   Chronic obstructive pulmonary disease (HCC) 07/10/2021   HLD (hyperlipidemia) 03/10/2021   Hypothyroidism 03/10/2021   Chronic bilateral back pain 03/10/2021  History of adenomatous polyps of colon 12/29/2019   Essential hypertension 06/13/2011   Obstructive sleep apnea (adult) (pediatric) 06/13/2011   Gastro-esophageal reflux disease without esophagitis 05/02/2009    PCP: Jason Leita Repine, FNP   REFERRING PROVIDER: Moffo, Katelyn Rose, [place]*   REFERRING DIAG: [phone] (ICD-10-CM) - Fusion of spine, lumbar region M25.551 (ICD-10-CM) - Pain in right hip M25.552 (ICD-10-CM) - Pain in left hip  THERAPY DIAG:  Pain in left hip  Pain in right hip  Other abnormalities of gait and mobility  Muscle weakness (generalized)  RATIONALE FOR  EVALUATION AND TREATMENT: Rehabilitation  ONSET DATE: 1 month ago  NEXT MD VISIT: unknown   SUBJECTIVE:                                                                                                                                                                                                         SUBJECTIVE STATEMENT: 8/13:  States is sore in his hamstrings, but his lower back feels better after MFR last visit.  States went to [facility] pool yesterday and did some weights with DB in the water.  Rates pain today 1-2/10   68 y/o male referred to PT from spine surgeon at [facility] for B hip pain and muscular back pain.  Patient Has had a lot of surgeries starting with original back surgery on 04/04/22, developed PE on 04/08/22, then developed sepsis on 06/26/22, had increased back pain starting in March 2024, cage in back came loose, had hardware removed and IVC on 11/05/22.   Came here for PT and was just d/c in 3/25.    States didn't keep doing his home exercises.  States started having intermittent bilateral hip pain about 1 month ago for no apparent reason.   States pain is R posterior hip and L flank pain along the iliac crest.   States he had injections into the R buttock and L hip last Friday by Lloyd Beers and they are helping some.  States pain has caused him to have to start using his cane again. States was going to go to [employer] with family and do some pool exercise after last episode of care with our clinic, but never followed through.  Really can't give a reason for it, other than just didn't do it.  States hasn't been doing his HEP.   States he wants to be able to help out with [employer] football this fall and wants to be able to walk better  PAIN: Are you having pain? 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 No  LIVING ENVIRONMENT: Lives with: lives with their family Lives in: House/apartment Stairs: Yes: External: 6 steps; can reach both Has following equipment at home: Single point cane  OCCUPATION: retired Sports coach  PLOF: Independent with gait  PATIENT GOALS: be able to stand up straight, not hurt   OBJECTIVE: (objective measures completed at initial evaluation unless otherwise dated)  DIAGNOSTIC FINDINGS:  11/06/22 XR Lumbar spine IMPRESSION:  1. L2-L5 posterior lumbar fusion with instrumentation and anterior  lumbar discectomy. Stable alignment.  PATIENT SURVEYS:   ODI = 15/50 LEFS = 31-80  SCREENING FOR RED FLAGS: Bowel or bladder incontinence: No Spinal tumors: No Cauda equina syndrome: No Compression fracture: No Abdominal aneurysm: No  COGNITION:  Overall cognitive status: Within functional limits for tasks assessed    SENSATION: WFL  POSTURE:  Flexed at the back and hips 30 degrees  PALPATION: TTP over the L Quadratus lumborum and along the flank above the iliac crest;  some TTP over the R piriformis  LUMBAR ROM:   Active  Eval  Flexion Mid shin; ham pull  Extension -20 deg; p!  Right lateral flexion To mild thigh  Left lateral flexion To mid thigh; L flank p!  Right rotation 30% ROM  Left rotation 20% ROM  (Blank rows = not tested)  MUSCLE LENGTH: Hamstrings: Right SLR 74 deg; Left 80 deg Thomas test: Right -20 deg; Left -15 deg Hamstrings: 90/90 RLE = -35;  LLE = -20  LOWER EXTREMITY ROM:     Active  Right eval Left eval  Hip flexion    Hip extension -15 -10  Hip abduction    Hip adduction    Hip internal rotation 15  10  Hip external rotation    Knee flexion    Knee extension -5 -3  Ankle dorsiflexion    Ankle plantarflexion    Ankle inversion    Ankle eversion    (Blank rows = not tested)  LOWER EXTREMITY MMT:    MMT Right eval Left eval  Hip flexion 4 4-  Hip extension    Hip abduction 4+ 4+  Hip adduction    Hip internal rotation    Hip external rotation 4+ 4  Knee flexion 4 4  Knee extension 5 5  Ankle dorsiflexion 4- 2  Ankle plantarflexion    Ankle inversion    Ankle eversion     (Blank rows = not tested)  LUMBAR SPECIAL TESTS:  Straight leg raise test: Negative, Slump test: Negative, Gaenslen's test: Negative, and Thomas test: Negative  FUNCTIONAL TESTS:  DGI = 14 5x STS = 14.93 sec GAIT: Distance walked: 200 feet into clinic Assistive device utilized: Single point cane Level of assistance: Complete Independence Gait pattern: decreased arm swing- Right, decreased arm swing- Left, decreased step length- Right, decreased step length- Left, decreased stance time- Right, decreased stance time- Left, decreased stride length, knee flexed in stance- Right, knee flexed in stance- Left, and poor foot clearance- Left Comments: flexed posture, flexed hips, short shuffling gait LLE 12.43  sec/6.30m = .49 m/s   TODAY'S TREATMENT:  12/25/23 THERAPEUTIC EXERCISE: To improve strength.  Demonstration, verbal and tactile cues throughout for technique. Bike L4 x 8'  THERAPEUTIC ACTIVITIES: To improve functional performance.  Demonstration, verbal and tactile cues throughout for technique. Seated hip flexor stretch x 1' x 2 BLE Seated ham stretch x 1' x 2 BLE Seated R SB/L Quadratus stretch x 1'  Standing counter back extension stretch x 1' Standing hip ext GTB x 15 BLE Standing hip flexor stretch at counter x 30 sec x 3 BLE Standing at counter cabinet w/ LUE LT raise TRX L quadratus stretch x 1' x 3   NEUROMUSCULAR RE-EDUCATION: To improve balance. Forward lunges x 10 BLE Lateral  lunges x 10 BLE Sidestepping GTB at feet x 3 laps  MANUAL THERAPY: To promote reduced pain utilizing myofascial release. L Quadratus MFR in partial R SB over bolster/leg prop  12/23/23 THERAPEUTIC EXERCISE: To improve strength.  Demonstration, verbal and tactile cues throughout for technique. NuStep L4 x 8'  THERAPEUTIC ACTIVITIES: To improve functional performance.  Demonstration, verbal and tactile cues throughout for technique. Seated hip flexor stretch x 1' x 2 BLE Seated ham stretch x 1' x 2 BLE TRX Quadratus stretch x 1'  Doorway pec stretch Standing counter back extension stretch x 1' Standing hip ext RTB x 15 BLE Supine bridging x 20 Supine LTR x 20 Supine hip flexor stretch x 1' RLE  NEUROMUSCULAR RE-EDUCATION: To improve balance. Forward lunges x 10 BLE Lateral lunges x 10 BLE Sidestepping GTB  knees x 3 laps  MANUAL THERAPY: To promote reduced pain utilizing myofascial release. L Quadratus MFR in partial R SB over bolster/leg prop  12/11/23 SELF CARE: Provided education to improve safety with use of SPC for assistive device, to reduce fall risk, and to prevent loss of gains achieved with physical therapyy, initial HEP instruction  PATIENT EDUCATION:  Education details: HEP review and HEP update  Person educated: Patient Education method: Explanation, Demonstration, Verbal cues, Tactile cues, and MedBridgeGO app access provided Education comprehension: verbalized understanding, verbal cues required, and tactile cues required  HOME EXERCISE PROGRAM: Access Code: [facility] URL: [email or web address] Date: 12/11/2023 Prepared by: Garnette Montclair  Exercises - Hip Flexor Stretch at [facility] of Bed  - 1 x daily - 7 x weekly - 1 sets - 2 reps - 1 min hold - Supine Lower Trunk Rotation  - 1 x daily - 7 x weekly - 3 sets - 10 reps - Seated Quadratus Lumborum Stretch in Chair  - 1 x daily - 7 x weekly - 1 sets - 2 reps - 1 min hold - Wall Squat  - 1 x daily -  7 x weekly - 3 sets - 10 reps   ASSESSMENT:  CLINICAL IMPRESSION: 12/25/23: Patient is able to progress to some increased resistance today with hip   EVAL:  Kyle Delacruz is a 68 y.o. male who was referred to physical therapy for evaluation and treatment for LBP and B hip pain.  HE is well known to us  from recent episode of care ending in 3/25.   He was given a comprehensive home exercise program at that time and unfortunately did not continue with it.   We recommended that he go to the pool as well, which he did not do.   He is c/o increased hip pain BLE and not so much back pain.     Patient reports onset of B hip pain beginning 1 month ago.  Pain is worse with bending/lifting.  He has significant foot neuropathy with inability to do much dorsiflexion on the LLE.   HE also has flexed posture with hip and back flexion contractures.  Patient has deficits in B hip ROM, B LE flexibility, BLE strength, abnormal posture, and TTP with abnormal muscle tension the L quadratus lumborum which are interfering with ADLs and are impacting quality of life.  On Modified Oswestry patient scored 15/50 demonstrating 30% disability.  Trek will benefit from skilled PT to address above deficits to improve mobility and activity tolerance with decreased pain interference.     OBJECTIVE IMPAIRMENTS: Abnormal gait, decreased balance, decreased mobility, difficulty walking, decreased ROM, decreased strength, impaired flexibility, impaired sensation, and pain.   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   REHAB POTENTIAL:  Good  CLINICAL DECISION MAKING: Evolving/moderate complexity  EVALUATION COMPLEXITY: Moderate   GOALS: Goals reviewed with patient? Yes  SHORT TERM GOALS: Target date:  01/08/2024  Patient will be independent with initial HEP to improve outcomes and carryover.  Baseline: assist required for all HEP 12/23/23:  Can teach back all HEP Goal status: MET  2.  Patient will report 25% improvement in low back pain to improve QOL. Baseline: 3/10 12/25/23:  1-2/10 Goal status: MET  3.  Patient will improve in gait speed to 0.9 m/s to reduce fall risk Baseline: 0.49 m/s Goal status: INITIAL   LONG TERM GOALS: Target date: 02/05/2024  Patient will be independent with ongoing/advanced HEP for self-management at home.  Baseline: no advanced HEP Goal status: INITIAL  2.  Patient will report 50-75% improvement in low back pain to improve QOL.  Baseline: 3/10 Goal status: INITIAL  3.  Patient to demonstrate ability to achieve and maintain good spinal alignment/posturing and body mechanics needed for daily activities. Baseline: -30 degrees back extension Goal status: INITIAL  4.  Patient will demonstrate -10 degrees lumbar extension to perform ADLs.   Baseline: Refer to above lumbar ROM table Goal status: INITIAL  5.  Patient will demonstrate improved BLE strength to >/= 4 to 4+/5 for improved stability and ease of mobility. Baseline: Refer to above LE MMT table Goal status: INITIAL  6. Patient will report </= 2% on Modified Oswestry (MCID = 12%) , and to 50/80 on LEFS to demonstrate improved functional ability with decreased pain interference. Baseline: 2/50 ODI;  31/80 LEFS Goal status: INITIAL  7.  Patient will tolerate 30 min of (standing/sitting/walking) w/o increased pain to allow for  improved mobility and activity tolerance. Baseline: 10 Goal status: INITIAL  PLAN:  PT FREQUENCY: 1-2x/week  PT DURATION: 8 weeks  PLANNED INTERVENTIONS: 97164- PT Re-evaluation, 97750-  Physical Performance Testing, 97110-Therapeutic exercises, 97530- Therapeutic activity, [id number]- Neuromuscular re-education, 97535- Self Care, [id number]- Manual therapy, [phone]- Gait training, [phone]- Electrical stimulation (unattended), 97016- Vasopneumatic device, [id number]- Ultrasound, [id number]- Ionotophoresis 4mg /ml Dexamethasone , [id number] (1-2 muscles), [id number] (3+ muscles)- Dry Needling, Patient/Family education, Balance training, Stair training, Taping, Joint mobilization, Cryotherapy, and Moist heat  PLAN FOR NEXT SESSION: Continue MFR to L QL/paraspinals; single leg stance wall pushups, continue hip stretching and strengthening  Debe Anfinson, PT 12/25/2023, 3:08 PM

## 2023-12-30 ENCOUNTER — Ambulatory Visit: Admitting: Rehabilitation

## 2023-12-30 ENCOUNTER — Encounter: Payer: Self-pay | Admitting: Rehabilitation

## 2023-12-30 DIAGNOSIS — M25552 Pain in left hip: Secondary | ICD-10-CM

## 2023-12-30 DIAGNOSIS — M6281 Muscle weakness (generalized): Secondary | ICD-10-CM

## 2023-12-30 DIAGNOSIS — M25551 Pain in right hip: Secondary | ICD-10-CM

## 2023-12-30 DIAGNOSIS — M5459 Other low back pain: Secondary | ICD-10-CM

## 2023-12-30 DIAGNOSIS — R2689 Other abnormalities of gait and mobility: Secondary | ICD-10-CM

## 2023-12-30 NOTE — Therapy (Signed)
 OUTPATIENT PHYSICAL THERAPY THORACOLUMBAR EVALUATION   Patient Name: Kyle Delacruz MRN: [id number] DOB:04/29/1956, 68 y.o., male Today's Date: 12/30/2023  END OF SESSION:  PT End of Session - 12/30/23 1325     Visit Number 4    Date for PT Re-Evaluation 02/05/24    Authorization Type [employer]/ 75 visit limit PT/OT/SLP    PT Start Time 1313    PT Stop Time 1400    PT Time Calculation (min) 47 min    Activity Tolerance Patient tolerated treatment well    Behavior During Therapy WFL for tasks assessed/performed          Past Medical History:  Diagnosis Date   Allergy    Arthritis    osteo   CTS (carpal tunnel syndrome)    DJD (degenerative joint disease)    DM2 (diabetes mellitus, type 2) (HCC)    DVT (deep venous thrombosis) (HCC)    GERD (gastroesophageal reflux disease)    Gout    History of adenomatous polyps of colon 12/29/2019   History of nuclear stress test    Myoview  11/17: EF 54, no ST changes, hypertensive blood pressure response, no ischemia, low risk   Hyperlipidemia    Hypothyroidism    Nicotine addiction    OSA (obstructive sleep apnea)    Prostatitis    Reactive airway disease    Sarcocystosis    Sinus arrhythmia    Sleep apnea    cpap- not wearing   Tinea corporis    Past Surgical History:  Procedure Laterality Date   BILATERAL KNEE ARTHROSCOPY     right- 1997; left 2001   COLONOSCOPY  multiple last 2013   IVC FILTER INSERTION N/A 09/17/2022   Procedure: IVC FILTER INSERTION;  Surgeon: Sheree Penne Bruckner, MD;  Location: [facility] INVASIVE CV LAB;  Service: Cardiovascular;  Laterality: N/A;   IVC FILTER REMOVAL N/A 01/28/2023   Procedure: IVC FILTER REMOVAL;  Surgeon: Sheree Penne Bruckner, MD;  Location: [facility] INVASIVE CV LAB;  Service: Cardiovascular;  Laterality: N/A;   KNEE SURGERY Right 2014   torn R medial meniscus- arthroscopy done   LAPAROTOMY N/A 04/10/2022   Procedure: EXPLORATORY LAPAROTOMY, TOTAL COLECTOMY, END ILEOSTOMY;   Surgeon: Lyndel Deward PARAS, MD;  Location: [facility];  Service: General;  Laterality: N/A;   PARATHYROIDECTOMY  2005   ROTATOR CUFF REPAIR  2004   right   TONSILLECTOMY     TOTAL HIP ARTHROPLASTY Left 2009   TOTAL HIP ARTHROPLASTY Right 2006   UPPER GASTROINTESTINAL ENDOSCOPY     Patient Active Problem List   Diagnosis Date Noted   History of pulmonary embolism 06/28/2022   History of DVT (deep vein thrombosis) 06/28/2022   Lumbar radiculopathy, chronic 06/28/2022   Nausea and vomiting 06/28/2022   Septic shock (HCC) 06/26/2022   SVT (supraventricular tachycardia) (HCC) 06/19/2022   PAF (paroxysmal atrial fibrillation) (HCC) 06/18/2022   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   High output ileostomy (HCC) 04/21/2022   Chronic anticoagulation 04/21/2022   Normocytic anemia 04/10/2022   Shock (HCC) 04/10/2022   Deep vein thrombosis (DVT) of femoral vein of right lower extremity (HCC) 04/09/2022   Leg DVT (deep venous thromboembolism), chronic, right (HCC) 04/08/2022   Abdominal distension 04/08/2022   Acute respiratory failure with hypoxia (HCC) 07/27/2021   PE (pulmonary thromboembolism) (HCC) 07/26/2021   Carcinoma of prostate (HCC) 07/10/2021   Chronic obstructive pulmonary disease (HCC) 07/10/2021   HLD (hyperlipidemia) 03/10/2021   Hypothyroidism 03/10/2021   Chronic bilateral back pain 03/10/2021  History of adenomatous polyps of colon 12/29/2019   Essential hypertension 06/13/2011   Obstructive sleep apnea (adult) (pediatric) 06/13/2011   Gastro-esophageal reflux disease without esophagitis 05/02/2009    PCP: Jason Leita Repine, FNP   REFERRING PROVIDER: Moffo, Katelyn Rose, Kyle*   REFERRING DIAG: [phone] (ICD-10-CM) - Fusion of spine, lumbar region M25.551 (ICD-10-CM) - Pain in right hip M25.552 (ICD-10-CM) - Pain in left hip  THERAPY DIAG:  Pain in left hip  Pain in right hip  Other abnormalities of gait and mobility  Muscle weakness (generalized)  Other low back  pain  RATIONALE FOR EVALUATION AND TREATMENT: Rehabilitation  ONSET DATE: 1 month ago  NEXT MD VISIT: unknown   SUBJECTIVE:                                                                                                                                                                                                         SUBJECTIVE STATEMENT: 12/30/23:  Patient reports having increased L posterior hip and posterior thigh pain today.  States the massage last treatment really helped.  States he went to the [facility] next day and was in pool doing exercises and twisted to far to the left and started having pain in the L posterior hip.  68 y/o male referred to PT from spine surgeon at [facility] for B hip pain and muscular back pain.  Patient Has had a lot of surgeries starting with original back surgery on 04/04/22, developed PE on 04/08/22, then developed sepsis on 06/26/22, had increased back pain starting in March 2024, cage in back came loose, had hardware removed and IVC on 11/05/22.   Came here for PT and was just d/c in 3/25.    States didn't keep doing his home exercises.  States started having intermittent bilateral hip pain about 1 month ago for no apparent reason.   States pain is R posterior hip and L flank pain along the iliac crest.   States he had injections into the R buttock and L hip last Friday by Lloyd Beers and they are helping some.  States pain has caused him to have to start using his cane again. States was going to go to [employer] with family and do some pool exercise after last episode of care with our clinic, but never followed through.  Really can't give a reason for it, other than just didn't do it.  States hasn't been doing his HEP.   States he wants to be able to help out with [employer] football this fall and  wants to be able to walk better  PAIN: Are you having pain? 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 No  LIVING ENVIRONMENT: Lives with: lives with their family Lives in: House/apartment Stairs: Yes: External: 6 steps; can reach both Has following equipment at home: Single point cane  OCCUPATION: retired Sports coach  PLOF: Independent with gait  PATIENT GOALS: be able to stand up straight, not hurt   OBJECTIVE: (objective measures completed at initial evaluation unless otherwise dated)  DIAGNOSTIC FINDINGS:  11/06/22 XR Lumbar spine IMPRESSION:  1. L2-L5 posterior lumbar fusion with instrumentation and anterior  lumbar discectomy. Stable alignment.  PATIENT SURVEYS:   ODI = 15/50 LEFS = 31-80  SCREENING FOR RED FLAGS: Bowel or bladder incontinence: No Spinal tumors: No Cauda equina syndrome: No Compression fracture: No Abdominal aneurysm: No  COGNITION:  Overall cognitive status: Within functional limits for tasks assessed    SENSATION: WFL  POSTURE:  Flexed at the back and hips 30 degrees  PALPATION: TTP over the L Quadratus lumborum and along the flank above the iliac crest;  some TTP over the R piriformis  LUMBAR ROM:   Active  Eval  Flexion Mid shin; ham pull  Extension -20 deg; p!  Right lateral flexion To mild thigh  Left lateral flexion To mid thigh; L flank p!  Right rotation 30% ROM  Left rotation 20% ROM  (Blank rows = not tested)  MUSCLE LENGTH: Hamstrings: Right SLR 74 deg; Left 80 deg Thomas test: Right -20 deg; Left -15 deg Hamstrings: 90/90 RLE = -35;  LLE = -20  LOWER EXTREMITY ROM:     Active  Right eval  Left eval  Hip flexion    Hip extension -15 -10  Hip abduction    Hip adduction    Hip internal rotation 15 10  Hip external rotation    Knee flexion    Knee extension -5 -3  Ankle dorsiflexion    Ankle plantarflexion    Ankle inversion    Ankle eversion    (Blank rows = not tested)  LOWER EXTREMITY MMT:    MMT Right eval Left eval  Hip flexion 4 4-  Hip extension    Hip abduction 4+ 4+  Hip adduction    Hip internal rotation    Hip external rotation 4+ 4  Knee flexion 4 4  Knee extension 5 5  Ankle dorsiflexion 4- 2  Ankle plantarflexion    Ankle inversion    Ankle eversion     (Blank rows = not tested)  LUMBAR SPECIAL TESTS:  Straight leg raise test: Negative, Slump test: Negative, Gaenslen's test: Negative, and Thomas test: Negative  FUNCTIONAL TESTS:  DGI = 14 5x STS = 14.93 sec GAIT: Distance walked: 200 feet into clinic Assistive device utilized: Single point cane Level of assistance: Complete Independence Gait pattern: decreased arm swing- Right, decreased arm swing- Left, decreased step length- Right, decreased step length- Left, decreased stance time- Right, decreased stance time- Left, decreased stride length, knee flexed in stance- Right, knee flexed  in stance- Left, and poor foot clearance- Left Comments: flexed posture, flexed hips, short shuffling gait LLE 12.43 sec/6.56m = .49 m/s   TODAY'S TREATMENT:  12/30/23 NuStep L4 x 8'  THERAPEUTIC ACTIVITIES: To improve functional performance.  Demonstration, verbal and tactile cues throughout for technique. Seated hip flexor stretch x 1' x 2 BLE Seated ham stretch x 1' x 2 BLE SL clams x 25 BLE SL fire hydrants x 2/10 BLE Supine modified/partial FABER piriformis stretch to L hip with PT assist to hold LLE In correct position  NEUROMUSCULAR RE-EDUCATION: To improve balance and posture. Counter pushups w/ SLS x 2/10 BLE Monster walk GTB x 6 laps at counter  MANUAL THERAPY: To promote reduced pain  utilizing myofascial release. Manual MFR to L piriformis;  Theragun to L piriformis  12/25/23 THERAPEUTIC EXERCISE: To improve strength.  Demonstration, verbal and tactile cues throughout for technique. Bike L4 x 8'  THERAPEUTIC ACTIVITIES: To improve functional performance.  Demonstration, verbal and tactile cues throughout for technique. Seated hip flexor stretch x 1' x 2 BLE Seated ham stretch x 1' x 2 BLE Seated R SB/L Quadratus stretch x 1'  Standing counter back extension stretch x 1' Standing hip ext GTB x 15 BLE Standing hip flexor stretch at counter x 30 sec x 3 BLE Standing at counter cabinet w/ LUE LT raise TRX L quadratus stretch x 1' x 3   NEUROMUSCULAR RE-EDUCATION: To improve balance. Forward lunges x 10 BLE Lateral lunges x 10 BLE Sidestepping GTB at feet x 3 laps  MANUAL THERAPY: To promote reduced pain utilizing myofascial release. L Quadratus MFR in partial R SB over bolster/leg prop  12/23/23 THERAPEUTIC EXERCISE: To improve strength.  Demonstration, verbal and tactile cues throughout for technique. NuStep L4 x 8'  THERAPEUTIC ACTIVITIES: To improve functional performance.  Demonstration, verbal and tactile cues throughout for technique. Seated hip flexor stretch x 1' x 2 BLE Seated ham stretch x 1' x 2 BLE TRX Quadratus stretch x 1'  Doorway pec stretch Standing counter back extension stretch x 1' Standing hip ext RTB x 15 BLE Supine bridging x 20 Supine LTR x 20 Supine hip flexor stretch x 1' RLE  NEUROMUSCULAR RE-EDUCATION: To improve balance. Forward lunges x 10 BLE Lateral lunges x 10 BLE Sidestepping GTB  knees x 3 laps  MANUAL THERAPY: To promote reduced pain utilizing myofascial release. L Quadratus MFR in partial R SB over bolster/leg prop  12/11/23 SELF CARE: Provided education to improve safety with use of SPC for assistive device, to reduce fall risk, and to prevent loss of gains achieved with physical therapyy, initial HEP  instruction  PATIENT EDUCATION:  Education details: HEP review and HEP update  Person educated: Patient Education method: Explanation, Demonstration, Verbal cues, Tactile cues, and MedBridgeGO app access provided Education comprehension: verbalized understanding, verbal cues required, and tactile cues required  HOME EXERCISE PROGRAM: Access Code: [facility] URL: [email or web address] Date: 12/11/2023 Prepared by: Garnette Montclair  Exercises - Hip Flexor Stretch at [facility] of Bed  - 1 x daily - 7 x weekly - 1 sets - 2 reps - 1 min hold - Supine Lower Trunk Rotation  - 1 x daily - 7 x weekly - 3 sets - 10 reps - Seated Quadratus Lumborum Stretch in Chair  - 1 x daily - 7 x weekly - 1 sets - 2 reps - 1 min hold - Wall Squat  - 1 x daily - 7 x weekly - 3 sets - 10 reps  ASSESSMENT:  CLINICAL IMPRESSION: 12/25/23: Patient is able to progress to some increased resistance today with hip   EVAL:  DEONE LEIFHEIT is a 68 y.o. male who was referred to physical therapy for evaluation and treatment for LBP and B hip pain.  HE is well known to us  from recent episode of care ending in 3/25.   He was given a comprehensive home exercise program at that time and unfortunately did not continue with it.   We recommended that he go to the pool as well, which he did not do.   He is c/o increased hip pain BLE and not so much back pain.     Patient reports onset of B hip pain beginning 1 month ago. Pain is worse with bending/lifting.  He has significant foot neuropathy with inability to do much dorsiflexion on the LLE.   HE also has flexed posture with hip and back flexion contractures.  Patient has deficits in B hip ROM, B LE flexibility, BLE strength, abnormal posture, and TTP with abnormal muscle tension the L quadratus lumborum which are interfering with ADLs and are impacting quality of life.  On Modified Oswestry patient scored 15/50 demonstrating 30% disability.  Kyle Delacruz will benefit from skilled  PT to address above deficits to improve mobility and activity tolerance with decreased pain interference.     OBJECTIVE IMPAIRMENTS: Abnormal gait, decreased balance, decreased mobility, difficulty walking, decreased ROM, decreased strength, impaired flexibility, impaired sensation, and pain.   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   REHAB POTENTIAL: Good  CLINICAL DECISION MAKING: Evolving/moderate complexity  EVALUATION COMPLEXITY: Moderate   GOALS: Goals reviewed with patient? Yes  SHORT TERM GOALS: Target date:  01/08/2024  Patient will be independent with initial HEP to improve outcomes and carryover.  Baseline: assist required for all HEP 12/23/23:  Can teach back all HEP Goal status: MET  2.  Patient will report 25% improvement in low back pain to improve QOL. Baseline: 3/10 12/25/23:  1-2/10 Goal status: MET  3.  Patient will improve in gait speed to 0.9 m/s to reduce fall risk Baseline: 0.49 m/s Goal status: INITIAL   LONG TERM GOALS: Target date: 02/05/2024  Patient will be independent with ongoing/advanced HEP for self-management at home.  Baseline: no advanced HEP 12/30/23:  HEP is being advanced to more difficult activities Goal status: IN PROGRESS  2.  Patient will report 50-75% improvement in low back pain to improve QOL.  Baseline: 3/10 12/30/23:  5/10 L hip Goal status: IN PROGRESS  3.  Patient to demonstrate ability to achieve and maintain good spinal alignment/posturing and body mechanics needed for daily activities. Baseline: -30 degrees back  extension 12/30/23:  -15 degrees today Goal status: IN PROGRESS  4.  Patient will demonstrate -10 degrees lumbar extension to perform ADLs.   Baseline: Refer to above lumbar ROM table 12/30/23:  -15 degrees today Goal status: IN PROGRESS  5.  Patient will demonstrate improved BLE strength to >/= 4 to 4+/5 for improved stability and ease of mobility. Baseline: Refer to above LE MMT table Goal status: INITIAL  6. Patient will report </= 2% on Modified Oswestry (MCID = 12%) , and to 50/80 on  LEFS to demonstrate improved functional ability with decreased pain interference. Baseline: 2/50 ODI;  31/80 LEFS Goal status: INITIAL  7.  Patient will tolerate 30 min of (standing/sitting/walking) w/o increased pain to allow for  improved mobility and activity tolerance. Baseline: 10 12/30/23:  15' went to 2 church services yesterday Goal status: IN PROGRESS  PLAN:  PT FREQUENCY: 1-2x/week  PT DURATION: 8 weeks  PLANNED INTERVENTIONS: 97164- PT Re-evaluation, 97750- Physical Performance Testing, 97110-Therapeutic exercises, 97530- Therapeutic activity, [id number]- Neuromuscular re-education, 97535- Self Care, [id number]- Manual therapy, [phone]- Gait training, [phone]- Electrical stimulation (unattended), 97016- Vasopneumatic device, [id number]- Ultrasound, [id number]- Ionotophoresis 4mg /ml Dexamethasone , 79439 (1-2 muscles), 20561 (3+ muscles)- Dry Needling, Patient/Family education, Balance training, Stair training, Taping, Joint mobilization, Cryotherapy, and Moist heat  PLAN FOR NEXT SESSION: Assess how pirformis MFR affected pain level;  continue to progress to closed chain strengthening of hips/lumbar stability Kyle Delacruz, PT 12/30/2023, 4:11 PM

## 2024-01-01 ENCOUNTER — Ambulatory Visit

## 2024-01-01 DIAGNOSIS — M25552 Pain in left hip: Secondary | ICD-10-CM

## 2024-01-01 DIAGNOSIS — R2689 Other abnormalities of gait and mobility: Secondary | ICD-10-CM

## 2024-01-01 DIAGNOSIS — M25551 Pain in right hip: Secondary | ICD-10-CM

## 2024-01-01 DIAGNOSIS — M5459 Other low back pain: Secondary | ICD-10-CM

## 2024-01-01 DIAGNOSIS — M6281 Muscle weakness (generalized): Secondary | ICD-10-CM

## 2024-01-01 NOTE — Therapy (Signed)
 OUTPATIENT PHYSICAL THERAPY THORACOLUMBAR TREATMENT   Patient Name: Kyle Delacruz MRN: [id number] DOB:10/26/1955, 68 y.o., male Today's Date: 01/01/2024  END OF SESSION:  PT End of Session - 01/01/24 1339     Visit Number 5    Date for PT Re-Evaluation 02/05/24    Authorization Type [employer]/ 75 visit limit PT/OT/SLP    PT Start Time 1315    PT Stop Time 1404    PT Time Calculation (min) 49 min    Activity Tolerance Patient tolerated treatment well    Behavior During Therapy WFL for tasks assessed/performed           Past Medical History:  Diagnosis Date   Allergy    Arthritis    osteo   CTS (carpal tunnel syndrome)    DJD (degenerative joint disease)    DM2 (diabetes mellitus, type 2) (HCC)    DVT (deep venous thrombosis) (HCC)    GERD (gastroesophageal reflux disease)    Gout    History of adenomatous polyps of colon 12/29/2019   History of nuclear stress test    Myoview  11/17: EF 54, no ST changes, hypertensive blood pressure response, no ischemia, low risk   Hyperlipidemia    Hypothyroidism    Nicotine addiction    OSA (obstructive sleep apnea)    Prostatitis    Reactive airway disease    Sarcocystosis    Sinus arrhythmia    Sleep apnea    cpap- not wearing   Tinea corporis    Past Surgical History:  Procedure Laterality Date   BILATERAL KNEE ARTHROSCOPY     right- 1997; left 2001   COLONOSCOPY  multiple last 2013   IVC FILTER INSERTION N/A 09/17/2022   Procedure: IVC FILTER INSERTION;  Surgeon: Kyle Penne Bruckner, MD;  Location: [facility] INVASIVE CV LAB;  Service: Cardiovascular;  Laterality: N/A;   IVC FILTER REMOVAL N/A 01/28/2023   Procedure: IVC FILTER REMOVAL;  Surgeon: Kyle Penne Bruckner, MD;  Location: [facility] INVASIVE CV LAB;  Service: Cardiovascular;  Laterality: N/A;   KNEE SURGERY Right 2014   torn R medial meniscus- arthroscopy done   LAPAROTOMY N/A 04/10/2022   Procedure: EXPLORATORY LAPAROTOMY, TOTAL COLECTOMY, END ILEOSTOMY;   Surgeon: Kyle Deward PARAS, MD;  Location: [facility];  Service: General;  Laterality: N/A;   PARATHYROIDECTOMY  2005   ROTATOR CUFF REPAIR  2004   right   TONSILLECTOMY     TOTAL HIP ARTHROPLASTY Left 2009   TOTAL HIP ARTHROPLASTY Right 2006   UPPER GASTROINTESTINAL ENDOSCOPY     Patient Active Problem List   Diagnosis Date Noted   History of pulmonary embolism 06/28/2022   History of DVT (deep vein thrombosis) 06/28/2022   Lumbar radiculopathy, chronic 06/28/2022   Nausea and vomiting 06/28/2022   Septic shock (HCC) 06/26/2022   SVT (supraventricular tachycardia) (HCC) 06/19/2022   PAF (paroxysmal atrial fibrillation) (HCC) 06/18/2022   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   High output ileostomy (HCC) 04/21/2022   Chronic anticoagulation 04/21/2022   Normocytic anemia 04/10/2022   Shock (HCC) 04/10/2022   Deep vein thrombosis (DVT) of femoral vein of right lower extremity (HCC) 04/09/2022   Leg DVT (deep venous thromboembolism), chronic, right (HCC) 04/08/2022   Abdominal distension 04/08/2022   Acute respiratory failure with hypoxia (HCC) 07/27/2021   PE (pulmonary thromboembolism) (HCC) 07/26/2021   Carcinoma of prostate (HCC) 07/10/2021   Chronic obstructive pulmonary disease (HCC) 07/10/2021   HLD (hyperlipidemia) 03/10/2021   Hypothyroidism 03/10/2021   Chronic bilateral back pain 03/10/2021  History of adenomatous polyps of colon 12/29/2019   Essential hypertension 06/13/2011   Obstructive sleep apnea (adult) (pediatric) 06/13/2011   Gastro-esophageal reflux disease without esophagitis 05/02/2009    PCP: Kyle Leita Repine, FNP   REFERRING PROVIDER: Moffo, Kyle Delacruz, [place]*   REFERRING DIAG: [phone] (ICD-10-CM) - Fusion of spine, lumbar region M25.551 (ICD-10-CM) - Pain in right hip M25.552 (ICD-10-CM) - Pain in left hip  THERAPY DIAG:  Pain in left hip  Pain in right hip  Other abnormalities of gait and mobility  Muscle weakness (generalized)  Other low back  pain  RATIONALE FOR EVALUATION AND TREATMENT: Rehabilitation  ONSET DATE: 1 month ago  NEXT MD VISIT: unknown   SUBJECTIVE:                                                                                                                                                                                                         SUBJECTIVE STATEMENT: Pt reports he is sore today, he does feel like the massage helped. His neck still hurting since massage 2 weeks ago at [facility]  68 y/o male referred to PT from spine surgeon at [facility] for B hip pain and muscular back pain.  Patient Has had a lot of surgeries starting with original back surgery on 04/04/22, developed PE on 04/08/22, then developed sepsis on 06/26/22, had increased back pain starting in March 2024, cage in back came loose, had hardware removed and IVC on 11/05/22.   Came here for PT and was just d/c in 3/25.    States didn't keep doing his home exercises.  States started having intermittent bilateral hip pain about 1 month ago for no apparent reason.   States pain is R posterior hip and L flank pain along the iliac crest.   States he had injections into the R buttock and L hip last Friday by Kyle Delacruz and they are helping some.  States pain has caused him to have to start using his cane again. States was going to go to [employer] with family and do some pool exercise after last episode of care with our clinic, but never followed through.  Really can't give a reason for it, other than just didn't do it.  States hasn't been doing his HEP.   States he wants to be able to help out with [employer] football this fall and wants to be able to walk better  PAIN: Are you having pain? 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 No  LIVING ENVIRONMENT: Lives with: lives with their family Lives in: House/apartment Stairs: Yes: External: 6 steps; can reach both Has following equipment at home: Single point cane  OCCUPATION: retired Sports coach  PLOF: Independent with gait  PATIENT GOALS: be able to stand up straight, not hurt   OBJECTIVE: (objective measures completed at initial evaluation unless otherwise dated)  DIAGNOSTIC FINDINGS:  11/06/22 XR Lumbar spine IMPRESSION:  1. L2-L5 posterior lumbar fusion with instrumentation and anterior  lumbar discectomy. Stable alignment.  PATIENT SURVEYS:   ODI = 15/50 LEFS = 31-80  SCREENING FOR RED FLAGS: Bowel or bladder incontinence: No Spinal tumors: No Cauda equina syndrome: No Compression fracture: No Abdominal aneurysm: No  COGNITION:  Overall cognitive status: Within functional limits for tasks assessed    SENSATION: WFL  POSTURE:  Flexed at the back and hips 30 degrees  PALPATION: TTP over the L Quadratus lumborum and along the flank above the iliac crest;  some TTP over the R piriformis  LUMBAR ROM:   Active  Eval  Flexion Mid shin; ham pull  Extension -20 deg; p!  Right lateral flexion To mild thigh  Left lateral flexion To mid thigh; L flank p!  Right rotation 30% ROM  Left rotation 20% ROM  (Blank rows = not tested)  MUSCLE LENGTH: Hamstrings: Right SLR 74 deg; Left 80 deg Thomas test: Right -20 deg; Left -15 deg Hamstrings: 90/90 RLE = -35;  LLE = -20  LOWER EXTREMITY ROM:     Active  Right eval Left eval  Hip flexion    Hip extension -15 -10  Hip abduction    Hip adduction    Hip internal rotation 15 10  Hip external rotation    Knee flexion     Knee extension -5 -3  Ankle dorsiflexion    Ankle plantarflexion    Ankle inversion    Ankle eversion    (Blank rows = not tested)  LOWER EXTREMITY MMT:    MMT Right eval Left eval  Hip flexion 4 4-  Hip extension    Hip abduction 4+ 4+  Hip adduction    Hip internal rotation    Hip external rotation 4+ 4  Knee flexion 4 4  Knee extension 5 5  Ankle dorsiflexion 4- 2  Ankle plantarflexion    Ankle inversion    Ankle eversion     (Blank rows = not tested)  LUMBAR SPECIAL TESTS:  Straight leg raise test: Negative, Slump test: Negative, Gaenslen's test: Negative, and Thomas test: Negative  FUNCTIONAL TESTS:  DGI = 14 5x STS = 14.93 sec GAIT: Distance walked: 200 feet into clinic Assistive device utilized: Single point cane Level of assistance: Complete Independence Gait pattern: decreased arm swing- Right, decreased arm swing- Left, decreased step length- Right, decreased step length- Left, decreased stance time- Right, decreased stance time- Left, decreased stride length, knee flexed in stance- Right, knee flexed in stance- Left, and poor foot clearance- Left Comments: flexed posture, flexed hips, short shuffling gait LLE 12.43 sec/6.39m = .49 m/s   TODAY'S TREATMENT:  01/01/24  NuStep L4 x 10'  THERAPEUTIC ACTIVITIES: To improve functional performance.  Demonstration, verbal and tactile cues throughout for technique. Standing heel raise and toe raise 2 x 10 Standing marching x 15 B Push ups with ladder x 10 SLS B Sidesteps GTB at ankles 6x no support along mat table- fatigued after this Seated ham stretch x 1' x 2 BLE  MANUAL THERAPY: To promote reduced pain utilizing myofascial release. Manual MFR to L piriformis;  Theragun to L piriformis  12/30/23 NuStep L4 x 8'  THERAPEUTIC ACTIVITIES: To improve functional performance.  Demonstration, verbal and tactile cues throughout for technique. Seated hip flexor stretch x 1' x 2 BLE Seated ham stretch x 1' x 2 BLE SL  clams x 25 BLE SL fire hydrants x 2/10 BLE Supine modified/partial FABER piriformis stretch to L hip with PT assist to hold LLE In correct position  NEUROMUSCULAR RE-EDUCATION: To improve balance and posture. Counter pushups w/ SLS x 2/10 BLE Monster walk GTB x 6 laps at counter  MANUAL THERAPY: To promote reduced pain utilizing myofascial release. Manual MFR to L piriformis;  Theragun to L piriformis  12/25/23 THERAPEUTIC EXERCISE: To improve strength.  Demonstration, verbal and tactile cues throughout for technique. Bike L4 x 8'  THERAPEUTIC ACTIVITIES: To improve functional performance.  Demonstration, verbal and tactile cues throughout for technique. Seated hip flexor stretch x 1' x 2 BLE Seated ham stretch x 1' x 2 BLE Seated R SB/L Quadratus stretch x 1'  Standing counter back extension stretch x 1' Standing hip ext GTB x 15 BLE Standing hip flexor stretch at counter x 30 sec x 3 BLE Standing at counter cabinet w/ LUE LT raise TRX L quadratus stretch x 1' x 3   NEUROMUSCULAR RE-EDUCATION: To improve balance. Forward lunges x 10 BLE Lateral lunges x 10 BLE Sidestepping GTB at feet x 3 laps  MANUAL THERAPY: To promote reduced pain utilizing myofascial release. L Quadratus MFR in partial R SB over bolster/leg prop  12/23/23 THERAPEUTIC EXERCISE: To improve strength.  Demonstration, verbal and tactile cues throughout for technique. NuStep L4 x 8'  THERAPEUTIC ACTIVITIES: To improve functional performance.  Demonstration, verbal and tactile cues throughout for technique. Seated hip flexor stretch x 1' x 2 BLE Seated ham stretch x 1' x 2 BLE TRX Quadratus stretch x 1'  Doorway pec stretch Standing counter back extension stretch x 1' Standing hip ext RTB x 15 BLE Supine bridging x 20 Supine LTR x 20 Supine hip flexor stretch x 1' RLE  NEUROMUSCULAR RE-EDUCATION: To improve balance. Forward lunges x 10 BLE Lateral lunges x 10 BLE Sidestepping GTB  knees x 3  laps  MANUAL THERAPY: To promote reduced pain utilizing myofascial release. L Quadratus MFR in partial R SB over bolster/leg prop  12/11/23 SELF CARE: Provided education to improve safety with use of SPC for assistive device, to reduce fall risk, and to prevent loss of gains achieved with physical therapyy, initial HEP instruction  PATIENT EDUCATION:  Education details: HEP review and HEP update  Person educated: Patient Education method: Explanation, Demonstration, Verbal cues, Tactile cues, and MedBridgeGO app access provided Education comprehension: verbalized understanding, verbal cues required, and tactile cues required  HOME EXERCISE PROGRAM: Access Code: [facility] URL: [email or web address] Date: 12/11/2023 Prepared by: Garnette Montclair  Exercises - Hip Flexor Stretch at [facility] of Bed  - 1 x daily - 7 x weekly - 1 sets - 2 reps - 1 min hold - Supine Lower Trunk Rotation  - 1  x daily - 7 x weekly - 3 sets - 10 reps - Seated Quadratus Lumborum Stretch in Chair  - 1 x daily - 7 x weekly - 1 sets - 2 reps - 1 min hold - Wall Squat  - 1 x daily - 7 x weekly - 3 sets - 10 reps   ASSESSMENT:  CLINICAL IMPRESSION: Progressed with standing activities to improve tolerance for standing and functional performance. Some fatigue noted with sidestepping towards the end. Pt very tender with taut bands found in L piriformis.  EVAL:  Kyle Delacruz is a 68 y.o. male who was referred to physical therapy for evaluation and treatment for LBP and B hip pain.  HE is well known to us  from recent episode of care ending in 3/25.   He was given a comprehensive home exercise program at that time and unfortunately did not continue with it.   We recommended that he go to the pool as well, which he did not do.   He is c/o increased hip pain BLE and not so much back pain.     Patient reports onset of B hip pain beginning 1 month ago. Pain is worse with bending/lifting.  He has significant foot  neuropathy with inability to do much dorsiflexion on the LLE.   HE also has flexed posture with hip and back flexion contractures.  Patient has deficits in B hip ROM, B LE flexibility, BLE strength, abnormal posture, and TTP with abnormal muscle tension the L quadratus lumborum which are interfering with ADLs and are impacting quality of life.  On Modified Oswestry patient scored 15/50 demonstrating 30% disability.  Wyman will benefit from skilled PT to address above deficits to improve mobility and activity tolerance with decreased pain interference.     OBJECTIVE IMPAIRMENTS: Abnormal gait, decreased balance, decreased mobility, difficulty walking, decreased ROM, decreased strength, impaired flexibility, impaired sensation, and pain.   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   REHAB POTENTIAL: Good  CLINICAL DECISION MAKING: Evolving/moderate complexity  EVALUATION COMPLEXITY: Moderate   GOALS: Goals reviewed with patient? Yes  SHORT TERM GOALS: Target date:  01/08/2024  Patient will be independent with initial HEP to improve outcomes and carryover.  Baseline: assist required for all HEP 12/23/23:  Can teach back all HEP Goal status: MET  2.  Patient will report 25% improvement in low back pain to improve QOL. Baseline: 3/10 12/25/23:  1-2/10 Goal status: MET  3.  Patient will improve in gait speed to 0.9 m/s to reduce fall risk Baseline: 0.49 m/s Goal status: INITIAL   LONG TERM GOALS: Target date: 02/05/2024  Patient will be independent with ongoing/advanced HEP for self-management at  home.  Baseline: no advanced HEP 12/30/23:  HEP is being advanced to more difficult activities Goal status: IN PROGRESS  2.  Patient will report 50-75% improvement in low back pain to improve QOL.  Baseline: 3/10 12/30/23:  5/10 L hip Goal status: IN PROGRESS- 01/01/24 35% improvement  3.  Patient to demonstrate ability to achieve and maintain good spinal alignment/posturing and body mechanics needed for daily activities. Baseline: -30 degrees back extension 12/30/23:  -15 degrees today Goal status: IN PROGRESS  4.  Patient will demonstrate -10 degrees lumbar extension to perform ADLs.   Baseline: Refer to above lumbar ROM table 12/30/23:  -15 degrees today Goal status: IN PROGRESS  5.  Patient will demonstrate improved BLE strength to >/= 4 to 4+/5 for improved stability and ease of mobility. Baseline: Refer to above LE MMT table Goal status: INITIAL  6. Patient will report </= 2% on Modified Oswestry (MCID = 12%) , and to 50/80 on LEFS to demonstrate improved functional ability with decreased pain interference. Baseline: 2/50 ODI;  31/80 LEFS Goal status: INITIAL  7.  Patient will tolerate 30 min of (standing/sitting/walking) w/o increased pain to allow for  improved mobility and activity tolerance. Baseline: 10 12/30/23:  15' went to 2 church services yesterday Goal status: IN PROGRESS  PLAN:  PT FREQUENCY: 1-2x/week  PT DURATION: 8 weeks  PLANNED INTERVENTIONS: 97164- PT Re-evaluation, 97750- Physical Performance Testing, 97110-Therapeutic exercises, 97530- Therapeutic activity, [id number]- Neuromuscular re-education, 97535- Self Care, [id number]- Manual therapy, [phone]- Gait training, [phone]- Electrical stimulation (unattended), 97016- Vasopneumatic device, [id number]- Ultrasound, [id number]- Ionotophoresis 4mg /ml Dexamethasone , 79439 (1-2 muscles), 20561 (3+ muscles)- Dry Needling, Patient/Family education, Balance training, Stair training, Taping, Joint mobilization, Cryotherapy, and Moist  heat  PLAN FOR NEXT SESSION: Assess how pirformis MFR affected pain level;  continue to progress to closed chain strengthening of hips/lumbar stability Sol LITTIE Gaskins, PTA 01/01/2024, 2:22 PM

## 2024-01-06 ENCOUNTER — Encounter: Payer: Self-pay | Admitting: Rehabilitation

## 2024-01-06 ENCOUNTER — Ambulatory Visit: Admitting: Rehabilitation

## 2024-01-06 DIAGNOSIS — R2689 Other abnormalities of gait and mobility: Secondary | ICD-10-CM

## 2024-01-06 DIAGNOSIS — M25551 Pain in right hip: Secondary | ICD-10-CM

## 2024-01-06 DIAGNOSIS — M25552 Pain in left hip: Secondary | ICD-10-CM

## 2024-01-06 DIAGNOSIS — M6281 Muscle weakness (generalized): Secondary | ICD-10-CM

## 2024-01-06 NOTE — Therapy (Signed)
 OUTPATIENT PHYSICAL THERAPY THORACOLUMBAR TREATMENT   Patient Name: ABB GOBERT MRN: [id number] DOB:08-01-1955, 68 y.o., male Today's Date: 01/06/2024  END OF SESSION:  PT End of Session - 01/06/24 1456     Visit Number 6    Date for PT Re-Evaluation 02/05/24    Authorization Type [employer]/ 75 visit limit PT/OT/SLP    PT Start Time 1448    PT Stop Time 1518    PT Time Calculation (min) 30 min    Activity Tolerance Patient tolerated treatment well    Behavior During Therapy WFL for tasks assessed/performed           Past Medical History:  Diagnosis Date   Allergy    Arthritis    osteo   CTS (carpal tunnel syndrome)    DJD (degenerative joint disease)    DM2 (diabetes mellitus, type 2) (HCC)    DVT (deep venous thrombosis) (HCC)    GERD (gastroesophageal reflux disease)    Gout    History of adenomatous polyps of colon 12/29/2019   History of nuclear stress test    Myoview  11/17: EF 54, no ST changes, hypertensive blood pressure response, no ischemia, low risk   Hyperlipidemia    Hypothyroidism    Nicotine addiction    OSA (obstructive sleep apnea)    Prostatitis    Reactive airway disease    Sarcocystosis    Sinus arrhythmia    Sleep apnea    cpap- not wearing   Tinea corporis    Past Surgical History:  Procedure Laterality Date   BILATERAL KNEE ARTHROSCOPY     right- 1997; left 2001   COLONOSCOPY  multiple last 2013   IVC FILTER INSERTION N/A 09/17/2022   Procedure: IVC FILTER INSERTION;  Surgeon: Sheree Penne Bruckner, MD;  Location: [facility] INVASIVE CV LAB;  Service: Cardiovascular;  Laterality: N/A;   IVC FILTER REMOVAL N/A 01/28/2023   Procedure: IVC FILTER REMOVAL;  Surgeon: Sheree Penne Bruckner, MD;  Location: [facility] INVASIVE CV LAB;  Service: Cardiovascular;  Laterality: N/A;   KNEE SURGERY Right 2014   torn R medial meniscus- arthroscopy done   LAPAROTOMY N/A 04/10/2022   Procedure: EXPLORATORY LAPAROTOMY, TOTAL COLECTOMY, END ILEOSTOMY;   Surgeon: Lyndel Deward PARAS, MD;  Location: [facility];  Service: General;  Laterality: N/A;   PARATHYROIDECTOMY  2005   ROTATOR CUFF REPAIR  2004   right   TONSILLECTOMY     TOTAL HIP ARTHROPLASTY Left 2009   TOTAL HIP ARTHROPLASTY Right 2006   UPPER GASTROINTESTINAL ENDOSCOPY     Patient Active Problem List   Diagnosis Date Noted   History of pulmonary embolism 06/28/2022   History of DVT (deep vein thrombosis) 06/28/2022   Lumbar radiculopathy, chronic 06/28/2022   Nausea and vomiting 06/28/2022   Septic shock (HCC) 06/26/2022   SVT (supraventricular tachycardia) (HCC) 06/19/2022   PAF (paroxysmal atrial fibrillation) (HCC) 06/18/2022   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   High output ileostomy (HCC) 04/21/2022   Chronic anticoagulation 04/21/2022   Normocytic anemia 04/10/2022   Shock (HCC) 04/10/2022   Deep vein thrombosis (DVT) of femoral vein of right lower extremity (HCC) 04/09/2022   Leg DVT (deep venous thromboembolism), chronic, right (HCC) 04/08/2022   Abdominal distension 04/08/2022   Acute respiratory failure with hypoxia (HCC) 07/27/2021   PE (pulmonary thromboembolism) (HCC) 07/26/2021   Carcinoma of prostate (HCC) 07/10/2021   Chronic obstructive pulmonary disease (HCC) 07/10/2021   HLD (hyperlipidemia) 03/10/2021   Hypothyroidism 03/10/2021   Chronic bilateral back pain 03/10/2021  History of adenomatous polyps of colon 12/29/2019   Essential hypertension 06/13/2011   Obstructive sleep apnea (adult) (pediatric) 06/13/2011   Gastro-esophageal reflux disease without esophagitis 05/02/2009    PCP: Jason Leita Repine, FNP   REFERRING PROVIDER: Moffo, Katelyn Rose, [place]*   REFERRING DIAG: [phone] (ICD-10-CM) - Fusion of spine, lumbar region M25.551 (ICD-10-CM) - Pain in right hip M25.552 (ICD-10-CM) - Pain in left hip  THERAPY DIAG:  Pain in left hip  Other abnormalities of gait and mobility  Muscle weakness (generalized)  Pain in right hip  RATIONALE FOR  EVALUATION AND TREATMENT: Rehabilitation  ONSET DATE: 1 month ago  NEXT MD VISIT: unknown   SUBJECTIVE:                                                                                                                                                                                                         SUBJECTIVE STATEMENT: States having more pain today and doesn't think he can do any exercise other than Nustep or bike.   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   He is requesting manual treatment only today.     68 y/o male referred to PT from spine surgeon at [facility] for B hip pain and muscular back pain.  Patient Has had a lot of surgeries starting with original back surgery on 04/04/22, developed PE on 04/08/22, then developed sepsis on 06/26/22, had increased back pain starting in March 2024, cage in back came loose, had hardware removed and IVC on 11/05/22.   Came here for PT and was just d/c in 3/25.    States didn't keep doing his home exercises.  States started having intermittent bilateral hip pain about 1 month ago for no apparent reason.   States pain is R posterior hip and L flank pain along the iliac crest.   States he had injections into the R buttock and L hip last Friday by Lloyd Beers and they are helping some.  States pain has caused him to have to start using his cane again. States was going to go to [employer] with family and do some pool exercise after last episode of care with our clinic, but never followed through.  Really can't give a reason for it, other than just didn't do it.  States hasn't been doing his HEP.   States he wants to be able to help out with [employer] football this fall and wants to be able to walk better  PAIN: Are you having pain? 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 No  LIVING ENVIRONMENT: Lives with: lives with their family Lives in: House/apartment Stairs: Yes: External: 6 steps; can reach both Has following equipment at home: Single point cane  OCCUPATION: retired Sports coach  PLOF: Independent with gait  PATIENT GOALS: be able to stand up straight, not hurt   OBJECTIVE: (objective measures completed at initial evaluation unless otherwise dated)  DIAGNOSTIC FINDINGS:  11/06/22 XR Lumbar spine IMPRESSION:  1. L2-L5 posterior lumbar fusion with instrumentation and anterior  lumbar discectomy. Stable alignment.  PATIENT SURVEYS:   ODI = 15/50 LEFS = 31-80  SCREENING FOR RED FLAGS: Bowel or bladder incontinence: No Spinal tumors: No Cauda equina syndrome: No Compression fracture: No Abdominal aneurysm: No  COGNITION:  Overall cognitive status: Within functional limits for tasks assessed    SENSATION: WFL  POSTURE:  Flexed at the back and hips 30 degrees  PALPATION: TTP over the L Quadratus lumborum and along the flank above the iliac crest;  some TTP over the R piriformis  LUMBAR ROM:   Active  Eval  Flexion Mid shin; ham pull  Extension -20 deg; p!  Right lateral flexion To mild thigh  Left lateral flexion To mid thigh; L flank p!  Right rotation 30% ROM  Left rotation 20% ROM  (Blank rows = not tested)  MUSCLE LENGTH: Hamstrings: Right SLR 74 deg; Left 80 deg Thomas test: Right -20 deg; Left -15 deg Hamstrings: 90/90 RLE = -35;  LLE = -20  LOWER EXTREMITY ROM:     Active  Right eval Left eval   Hip flexion    Hip extension -15 -10  Hip abduction    Hip adduction    Hip internal rotation 15 10  Hip external rotation    Knee flexion    Knee extension -5 -3  Ankle dorsiflexion    Ankle plantarflexion    Ankle inversion    Ankle eversion    (Blank rows = not tested)  LOWER EXTREMITY MMT:    MMT Right eval Left eval  Hip flexion 4 4-  Hip extension    Hip abduction 4+ 4+  Hip adduction    Hip internal rotation    Hip external rotation 4+ 4  Knee flexion 4 4  Knee extension 5 5  Ankle dorsiflexion 4- 2  Ankle plantarflexion    Ankle inversion    Ankle eversion     (Blank rows = not tested)  LUMBAR SPECIAL TESTS:  Straight leg raise test: Negative, Slump test: Negative, Gaenslen's test: Negative, and Thomas test: Negative  FUNCTIONAL TESTS:  DGI = 14 5x STS = 14.93 sec GAIT: Distance walked: 200 feet into clinic Assistive device utilized: Single point cane Level of assistance: Complete Independence Gait pattern: decreased arm swing- Right, decreased arm swing- Left, decreased step length- Right, decreased step length- Left, decreased stance time- Right, decreased stance time- Left, decreased stride length, knee flexed  in stance- Right, knee flexed in stance- Left, and poor foot clearance- Left Comments: flexed posture, flexed hips, short shuffling gait LLE 12.43 sec/6.76m = .49 m/s   TODAY'S TREATMENT:  01/06/24 THERAPEUTIC EXERCISE: To improve strength.  Demonstration, verbal and tactile cues throughout for technique. Bike L4 x 10'  MANUAL THERAPY: To promote improved flexibility and reduced pain utilizing manual TP therapy and myofascial release. R sidelying:  MFR to quadratus lumborum cross hand technique, muscle stripping, deep pressure over piriformis followed by theragun to QL and piriformis  01/01/24 NuStep L4 x 10'  THERAPEUTIC ACTIVITIES: To improve functional performance.  Demonstration, verbal and tactile cues throughout for technique. Standing  heel raise and toe raise 2 x 10 Standing marching x 15 B Push ups with ladder x 10 SLS B Sidesteps GTB at ankles 6x no support along mat table- fatigued after this Seated ham stretch x 1' x 2 BLE  MANUAL THERAPY: To promote reduced pain utilizing myofascial release. Manual MFR to L piriformis;  Theragun to L piriformis  12/30/23 NuStep L4 x 8'  THERAPEUTIC ACTIVITIES: To improve functional performance.  Demonstration, verbal and tactile cues throughout for technique. Seated hip flexor stretch x 1' x 2 BLE Seated ham stretch x 1' x 2 BLE SL clams x 25 BLE SL fire hydrants x 2/10 BLE Supine modified/partial FABER piriformis stretch to L hip with PT assist to hold LLE In correct position  NEUROMUSCULAR RE-EDUCATION: To improve balance and posture. Counter pushups w/ SLS x 2/10 BLE Monster walk GTB x 6 laps at counter  MANUAL THERAPY: To promote reduced pain utilizing myofascial release. Manual MFR to L piriformis;  Theragun to L piriformis  12/25/23 THERAPEUTIC EXERCISE: To improve strength.  Demonstration, verbal and tactile cues throughout for technique. Bike L4 x 8'  THERAPEUTIC ACTIVITIES: To improve functional performance.  Demonstration, verbal and tactile cues throughout for technique. Seated hip flexor stretch x 1' x 2 BLE Seated ham stretch x 1' x 2 BLE Seated R SB/L Quadratus stretch x 1'  Standing counter back extension stretch x 1' Standing hip ext GTB x 15 BLE Standing hip flexor stretch at counter x 30 sec x 3 BLE Standing at counter cabinet w/ LUE LT raise TRX L quadratus stretch x 1' x 3   NEUROMUSCULAR RE-EDUCATION: To improve balance. Forward lunges x 10 BLE Lateral lunges x 10 BLE Sidestepping GTB at feet x 3 laps  MANUAL THERAPY: To promote reduced pain utilizing myofascial release. L Quadratus MFR in partial R SB over bolster/leg prop  12/23/23 THERAPEUTIC EXERCISE: To improve strength.  Demonstration, verbal and tactile cues throughout for  technique. NuStep L4 x 8'  THERAPEUTIC ACTIVITIES: To improve functional performance.  Demonstration, verbal and tactile cues throughout for technique. Seated hip flexor stretch x 1' x 2 BLE Seated ham stretch x 1' x 2 BLE TRX Quadratus stretch x 1'  Doorway pec stretch Standing counter back extension stretch x 1' Standing hip ext RTB x 15 BLE Supine bridging x 20 Supine LTR x 20 Supine hip flexor stretch x 1' RLE  NEUROMUSCULAR RE-EDUCATION: To improve balance. Forward lunges x 10 BLE Lateral lunges x 10 BLE Sidestepping GTB  knees x 3 laps  MANUAL THERAPY: To promote reduced pain utilizing myofascial release. L Quadratus MFR in partial R SB over bolster/leg prop  12/11/23 SELF CARE: Provided education to improve safety with use of SPC for assistive device, to reduce fall risk, and to prevent loss of gains achieved with physical therapyy, initial  HEP instruction  PATIENT EDUCATION:  Education details: standing posture, using stool PRN to sit in situations where it is possible; wearing brace below the colostomy for support for his back; standing with L foot propped on something to relieve pressure on L QL  Person educated: Patient Education method: Explanation, Demonstration, Verbal cues, Tactile cues, and MedBridgeGO app access provided Education comprehension: verbalized understanding, verbal cues required, and tactile cues required  HOME EXERCISE PROGRAM: Access Code: [facility] URL: [email or web address] Date: 12/11/2023 Prepared by: Garnette Montclair  Exercises - Hip Flexor Stretch at [facility] of Bed  - 1 x daily - 7 x weekly - 1 sets - 2 reps - 1 min hold - Supine Lower Trunk Rotation  - 1 x daily - 7 x weekly - 3 sets - 10 reps - Seated Quadratus Lumborum Stretch in Chair  - 1 x daily - 7 x weekly - 1 sets - 2 reps - 1 min hold - Wall Squat  - 1 x daily - 7 x weekly - 3 sets - 10 reps   ASSESSMENT:  CLINICAL IMPRESSION: Patient unable to complete any  therex/therapeutic activity today due to increased pain over the L hip/L low back.  He responds well to manual therapy.  However, he realizes that next week will be his final week of therapy and that he will need to continue with his hep at home.  He is going to the [facility] several days/week for pool activitty/exercise.  Plan to d/c after next visit.  EVAL:  ELUZER HOWDESHELL is a 68 y.o. male who was referred to physical therapy for evaluation and treatment for LBP and B hip pain.  HE is well known to us  from recent episode of care ending in 3/25.   He was given a comprehensive home exercise program at that time and unfortunately did not continue with it.   We recommended that he go to the pool as well, which he did not do.   He is c/o increased hip pain BLE and not so much back pain.     Patient reports onset of B hip pain beginning 1 month ago. Pain is worse with bending/lifting.  He has significant foot neuropathy with inability to do much dorsiflexion on the LLE.   HE also has flexed posture with hip and back flexion contractures.  Patient has deficits in B hip ROM, B LE flexibility, BLE strength, abnormal posture, and TTP with abnormal muscle tension the L quadratus lumborum which are interfering with ADLs and are impacting quality of life.  On Modified Oswestry patient scored 15/50 demonstrating 30% disability.  [place]aguire will benefit from skilled PT to address above deficits to improve mobility and activity tolerance with decreased pain interference.     OBJECTIVE IMPAIRMENTS: Abnormal gait, decreased balance, decreased mobility, difficulty walking, decreased ROM, decreased strength, impaired flexibility, impaired sensation, and pain.   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   REHAB POTENTIAL: Good  CLINICAL DECISION MAKING: Evolving/moderate complexity  EVALUATION COMPLEXITY: Moderate   GOALS: Goals reviewed with patient? Yes  SHORT TERM GOALS: Target date:  01/08/2024  Patient will be independent with initial HEP to improve outcomes and carryover.  Baseline: assist required for  all HEP 12/23/23:  Can teach back all HEP Goal status: MET  2.  Patient will report 25% improvement in low back pain to improve QOL. Baseline: 3/10 12/25/23:  1-2/10 Goal status: MET  3.  Patient will improve in gait speed to 0.9 m/s to reduce fall risk Baseline: 0.49 m/s Goal status: INITIAL   LONG TERM GOALS: Target date: 02/05/2024  Patient will be independent with ongoing/advanced HEP for self-management at home.  Baseline: no advanced HEP 12/30/23:  HEP is being advanced to more difficult activities Goal status: IN PROGRESS  2.  Patient will report 50-75% improvement in low back pain to improve QOL.  Baseline: 3/10 12/30/23:  5/10 L hip Goal status: IN PROGRESS- 01/01/24 35% improvement  3.  Patient to demonstrate ability to achieve and maintain good spinal alignment/posturing and body mechanics needed for daily activities. Baseline: -30 degrees back extension 12/30/23:  -15 degrees today Goal status: IN PROGRESS  4.  Patient will demonstrate -10 degrees lumbar extension to perform ADLs.   Baseline: Refer to above lumbar ROM table 12/30/23:  -15 degrees today Goal status: IN PROGRESS  5.  Patient will demonstrate improved BLE strength to >/= 4 to 4+/5 for improved stability and ease of mobility. Baseline: Refer to above LE MMT table Goal status: INITIAL  6. Patient will report </= 2% on Modified Oswestry (MCID = 12%) , and to 50/80 on LEFS to demonstrate improved functional ability with  decreased pain interference. Baseline: 2/50 ODI;  31/80 LEFS Goal status: INITIAL  7.  Patient will tolerate 30 min of (standing/sitting/walking) w/o increased pain to allow for  improved mobility and activity tolerance. 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 and did not have any pain while doing it (only had pain next day)_  PLAN:  PT FREQUENCY: 1-2x/week  PT DURATION: 8 weeks  PLANNED INTERVENTIONS: 97164- PT Re-evaluation, 97750- Physical Performance Testing, 97110-Therapeutic exercises, 97530- Therapeutic activity, 97112- Neuromuscular re-education, 97535- Self Care, 02859- Manual therapy, [id number]- Gait training, [id number]- Electrical stimulation (unattended), 97016- Vasopneumatic device, [id number]- Ultrasound, [id number]- Ionotophoresis 4mg /ml Dexamethasone , 79439 (1-2 muscles), 20561 (3+ muscles)- Dry Needling, Patient/Family education, Balance training, Stair training, Taping, Joint mobilization, Cryotherapy, and Moist heat  PLAN FOR NEXT SESSION:   Review HEP, recheck strength, ROM, and  ODI,  and D/C to HEP  Coury Grieger, PT 01/06/2024, 3:19 PM

## 2024-01-07 ENCOUNTER — Other Ambulatory Visit: Payer: Self-pay

## 2024-01-07 ENCOUNTER — Encounter: Payer: Self-pay | Admitting: Allergy & Immunology

## 2024-01-07 ENCOUNTER — Ambulatory Visit (INDEPENDENT_AMBULATORY_CARE_PROVIDER_SITE_OTHER): Payer: Self-pay | Admitting: Allergy & Immunology

## 2024-01-07 VITALS — BP 92/70 | HR 61 | Temp 98.4°F | Ht 67.5 in | Wt 229.9 lb

## 2024-01-07 DIAGNOSIS — T7802XD Anaphylactic reaction due to shellfish (crustaceans), subsequent encounter: Secondary | ICD-10-CM

## 2024-01-07 DIAGNOSIS — J310 Chronic rhinitis: Secondary | ICD-10-CM

## 2024-01-07 DIAGNOSIS — J4520 Mild intermittent asthma, uncomplicated: Secondary | ICD-10-CM

## 2024-01-07 DIAGNOSIS — T7802XA Anaphylactic reaction due to shellfish (crustaceans), initial encounter: Secondary | ICD-10-CM | POA: Diagnosis not present

## 2024-01-07 MED ORDER — EPINEPHRINE 0.3 MG/0.3ML IJ SOAJ: 0.3000 mg | Freq: Once | INTRAMUSCULAR | 1 refills | Status: AC

## 2024-01-07 NOTE — Patient Instructions (Addendum)
 1. Anaphylactic shock due to shellfish - We are going to get some blood work to look for a shellfish allergy. - EpiPen  training provided. - Emergency Action Plan provided.   - We will call you in 1-2 weeks with the results of the testing.  - Maybe we can get these back into the diet.  - We may consider Xolair in the future if needed.   2. Intermittent asthma, uncomplicated  - Lung testing not indicated. - Continue with albuterol  as needed.   3. Chronic rhinitis - We will defer testing since your symptoms are not too severe.   4. Return in about 1 year (around 01/06/2025). You can have the follow up appointment with Dr. Iva or a Nurse Practicioner (our Nurse Practitioners are excellent and always have Physician oversight!).    Please inform us  of any Emergency Department visits, hospitalizations, or changes in symptoms. Call us  before going to the ED for breathing or allergy symptoms since we might be able to fit you in for a sick visit. Feel free to contact us  anytime with any questions, problems, or concerns.  It was a pleasure to meet you today!  Websites that have reliable patient information: 1. American Academy of Asthma, Allergy, and Immunology: www.aaaai.org 2. Food Allergy Research and Education (FARE): foodallergy.org 3. Mothers of Asthmatics: http://www.asthmacommunitynetwork.org 4. American College of Allergy, Asthma, and Immunology: www.acaai.org      "Like" us  on Facebook and Instagram for our latest updates!      A healthy democracy works best when [employer] participate! Make sure you are registered to vote! If you have moved or changed any of your contact information, you will need to get this updated before voting! Scan the QR codes below to learn more!

## 2024-01-07 NOTE — Progress Notes (Unsigned)
 NEW PATIENT  Date of Service/Encounter:  01/07/24  Consult requested by: Jason Leita Repine, FNP   Assessment:   Mild intermittent asthma, uncomplicated  Anaphylactic shock due to shellfish, initial encounter - Plan: Allergen Profile, Shellfish, IgE  Chronic rhinitis  Intermittent asthma, uncomplicated  Plan/Recommendations:   Patient Instructions  1. Anaphylactic shock due to shellfish - We are going to get some blood work to look for a shellfish allergy. - EpiPen  training provided. - Emergency Action Plan provided.   - We will call you in 1-2 weeks with the results of the testing.  - Maybe we can get these back into the diet.  - We may consider Xolair in the future if needed.   2. Intermittent asthma, uncomplicated  - Lung testing not indicated. - Continue with albuterol  as needed.   3. Chronic rhinitis - We will defer testing since your symptoms are not too severe.   4. Return in about 1 year (around 01/06/2025). You can have the follow up appointment with Dr. Iva or a Nurse Practicioner (our Nurse Practitioners are excellent and always have Physician oversight!).    Please inform us  of any Emergency Department visits, hospitalizations, or changes in symptoms. Call us  before going to the ED for breathing or allergy symptoms since we might be able to fit you in for a sick visit. Feel free to contact us  anytime with any questions, problems, or concerns.  It was a pleasure to meet you today!  Websites that have reliable patient information: 1. American Academy of Asthma, Allergy, and Immunology: www.aaaai.org 2. Food Allergy Research and Education (FARE): foodallergy.org 3. Mothers of Asthmatics: http://www.asthmacommunitynetwork.org 4. American College of Allergy, Asthma, and Immunology: www.acaai.org      "Like" us  on Facebook and Instagram for our latest updates!      A healthy democracy works best when [employer] participate! Make sure you are  registered to vote! If you have moved or changed any of your contact information, you will need to get this updated before voting! Scan the QR codes below to learn more!              {Blank single:19197::This note in its entirety was forwarded to the Provider who requested this consultation.}  Subjective:   BHARGAV BARBARO is a 68 y.o. male presenting today for evaluation of  Chief Complaint  Patient presents with  . Allergic Reaction    Shellfish allergy, requesting epipen  and allergy management.     JEDIAH HORGER has a history of the following: Patient Active Problem List   Diagnosis Date Noted  . History of pulmonary embolism 06/28/2022  . History of DVT (deep vein thrombosis) 06/28/2022  . Lumbar radiculopathy, chronic 06/28/2022  . Nausea and vomiting 06/28/2022  . Septic shock (HCC) 06/26/2022  . SVT (supraventricular tachycardia) (HCC) 06/19/2022  . PAF (paroxysmal atrial fibrillation) (HCC) 06/18/2022  . 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  . High output ileostomy (HCC) 04/21/2022  . Chronic anticoagulation 04/21/2022  . Normocytic anemia 04/10/2022  . Shock (HCC) 04/10/2022  . Deep vein thrombosis (DVT) of femoral vein of right lower extremity (HCC) 04/09/2022  . Leg DVT (deep venous thromboembolism), chronic, right (HCC) 04/08/2022  . Abdominal distension 04/08/2022  . Acute respiratory failure with hypoxia (HCC) 07/27/2021  . PE (pulmonary thromboembolism) (HCC) 07/26/2021  . Carcinoma of prostate (HCC) 07/10/2021  . Chronic obstructive pulmonary disease (HCC) 07/10/2021  . HLD (hyperlipidemia) 03/10/2021  . Hypothyroidism 03/10/2021  . Chronic bilateral back pain 03/10/2021  .  History of adenomatous polyps of colon 12/29/2019  . Essential hypertension 06/13/2011  . Obstructive sleep apnea (adult) (pediatric) 06/13/2011  . Gastro-esophageal reflux disease without esophagitis 05/02/2009    History obtained from: chart review and {Persons; PED relatives  w/patient:19415::patient}.  Discussed the use of AI scribe software for clinical note transcription with the patient and/or guardian, who gave verbal consent to proceed.  Ysabel Cowgill [facility] was referred by Jason Leita Repine, FNP.     Jetson is a 68 y.o. male presenting for {Blank single:19197::a food challenge,a drug challenge,skin testing,a sick visit,an evaluation of ***,a follow up visit}.    Asthma/Respiratory Symptom History: ***  Allergic Rhinitis Symptom History: ***  Food Allergy Symptom History: ***  Skin Symptom History: ***  GERD Symptom History: ***  Infection Symptom History: ***  ***Otherwise, there is no history of other atopic diseases, including {Blank multiple:19196:o:asthma,food allergies,drug allergies,environmental allergies,stinging insect allergies,eczema,urticaria,contact dermatitis}. There is no significant infectious history. ***Vaccinations are up to date.    Past Medical History: Patient Active Problem List   Diagnosis Date Noted  . History of pulmonary embolism 06/28/2022  . History of DVT (deep vein thrombosis) 06/28/2022  . Lumbar radiculopathy, chronic 06/28/2022  . Nausea and vomiting 06/28/2022  . Septic shock (HCC) 06/26/2022  . SVT (supraventricular tachycardia) (HCC) 06/19/2022  . PAF (paroxysmal atrial fibrillation) (HCC) 06/18/2022  . 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  . High output ileostomy (HCC) 04/21/2022  . Chronic anticoagulation 04/21/2022  . Normocytic anemia 04/10/2022  . Shock (HCC) 04/10/2022  . Deep vein thrombosis (DVT) of femoral vein of right lower extremity (HCC) 04/09/2022  . Leg DVT (deep venous thromboembolism), chronic, right (HCC) 04/08/2022  . Abdominal distension 04/08/2022  . Acute respiratory failure with hypoxia (HCC) 07/27/2021  . PE (pulmonary thromboembolism) (HCC) 07/26/2021  . Carcinoma of prostate (HCC) 07/10/2021  . Chronic obstructive pulmonary disease (HCC) 07/10/2021  .  HLD (hyperlipidemia) 03/10/2021  . Hypothyroidism 03/10/2021  . Chronic bilateral back pain 03/10/2021  . History of adenomatous polyps of colon 12/29/2019  . Essential hypertension 06/13/2011  . Obstructive sleep apnea (adult) (pediatric) 06/13/2011  . Gastro-esophageal reflux disease without esophagitis 05/02/2009    Medication List:  Allergies as of 01/07/2024       Reactions   Shellfish Allergy Anaphylaxis, Swelling   Shellfish-derived Products Anaphylaxis, Swelling   Metoclopramide Other (See Comments)   Causes shaking. Sweat like crazy Sweat like crazy    Causes shaking. Sweat like crazy   Penicillins Hives   Bee Venom Swelling   Lisinopril Other (See Comments)   Unknown Other Reaction(s): Unknown        Medication List        Accurate as of January 07, 2024  3:59 PM. If you have any questions, ask your nurse or doctor.          albuterol  108 (90 Base) MCG/ACT inhaler Commonly known as: VENTOLIN  HFA Inhale 2 puffs into the lungs every 6 (six) hours as needed for wheezing or shortness of breath.   Align 4 MG Caps Take 2 capsules by mouth daily. gummy   apixaban  5 MG Tabs tablet Commonly known as: ELIQUIS  Take 1 tablet (5 mg total) by mouth 2 (two) times daily.   cyclobenzaprine  10 MG tablet Commonly known as: FLEXERIL  Take 10 mg by mouth as needed.   fluticasone 50 MCG/ACT nasal spray Commonly known as: FLONASE Place 2 sprays into both nostrils daily.   folic acid  1 MG tablet Commonly known as: FOLVITE  Take 1 tablet (1  mg total) by mouth daily.   Iron (Ferrous Sulfate ) 325 (65 Fe) MG Tabs Take 1 tablet by mouth daily.   levothyroxine  50 MCG tablet Commonly known as: SYNTHROID  Take 50 mcg by mouth daily.   metoprolol  tartrate 50 MG tablet Commonly known as: LOPRESSOR  TAKE 1 TABLET BY MOUTH TWICE A DAY   multivitamin with minerals Tabs tablet Take 1 tablet by mouth daily.   omeprazole 40 MG capsule Commonly known as: PRILOSEC Take  40 mg by mouth 2 (two) times daily.   oxyCODONE -acetaminophen  5-325 MG tablet Commonly known as: PERCOCET/ROXICET Take 1-2 tablets by mouth every 4 (four) hours as needed for severe pain.   pregabalin 150 MG capsule Commonly known as: LYRICA Take 150 mg by mouth 2 (two) times daily as needed (pain).   rosuvastatin  40 MG tablet Commonly known as: CRESTOR  Take 40 mg by mouth daily.   Vitamin D  (Ergocalciferol ) 1.25 MG (50000 UNIT) Caps capsule Commonly known as: DRISDOL Take 50,000 Units by mouth every Friday.        Birth History: {Blank single:19197::non-contributory,born premature and spent time in the NICU,born at term without complications}  Developmental History: Zedric has met all milestones on time. He has required no {Blank multiple:19196:a:speech therapy,occupational therapy,physical therapy}. ***non-contributory  Past Surgical History: Past Surgical History:  Procedure Laterality Date  . BACK SURGERY  10/2022  . BILATERAL KNEE ARTHROSCOPY     right- 1997; left 2001  . COLONOSCOPY  multiple last 2013  . ILEOSTOMY  03/2022   Placement  . IVC FILTER INSERTION N/A 09/17/2022   Procedure: IVC FILTER INSERTION;  Surgeon: Sheree Penne Bruckner, MD;  Location: [facility] INVASIVE CV LAB;  Service: Cardiovascular;  Laterality: N/A;  . IVC FILTER REMOVAL N/A 01/28/2023   Procedure: IVC FILTER REMOVAL;  Surgeon: Sheree Penne Bruckner, MD;  Location: [facility] INVASIVE CV LAB;  Service: Cardiovascular;  Laterality: N/A;  . KNEE SURGERY Right 2014   torn R medial meniscus- arthroscopy done  . LAPAROTOMY N/A 04/10/2022   Procedure: EXPLORATORY LAPAROTOMY, TOTAL COLECTOMY, END ILEOSTOMY;  Surgeon: Lyndel Deward PARAS, MD;  Location: [facility] ORS;  Service: General;  Laterality: N/A;  . PARATHYROIDECTOMY  2005  . ROTATOR CUFF REPAIR  2004   right  . TONSILLECTOMY    . TOTAL HIP ARTHROPLASTY Left 2009  . TOTAL HIP ARTHROPLASTY Right 2006  . UPPER GASTROINTESTINAL ENDOSCOPY        Family History: Family History  Problem Relation Age of Onset  . Stroke Mother   . Diabetes Father   . Heart disease Father        sp CABG  . Healthy Sister   . Healthy Brother   . Healthy Maternal Aunt   . Healthy Maternal Uncle   . Healthy Paternal Aunt   . Healthy Paternal Uncle   . Healthy Maternal Grandmother   . Healthy Maternal Grandfather   . Diabetes Paternal Grandmother   . Healthy Paternal Grandfather   . Healthy Other   . Colon cancer Neg Hx   . Stomach cancer Neg Hx   . Esophageal cancer Neg Hx   . Rectal cancer Neg Hx   . Prostate cancer Neg Hx   . Allergic rhinitis Neg Hx   . Angioedema Neg Hx   . Asthma Neg Hx   . Atopy Neg Hx   . Eczema Neg Hx   . Immunodeficiency Neg Hx   . Urticaria Neg Hx      Social History: Damond lives at home with ***.  Review of systems otherwise negative other than that mentioned in the HPI.    Objective:   Blood pressure 92/70, pulse 61, temperature 98.4 F (36.9 C), temperature source Temporal, height 5' 7.5 (1.715 m), weight 229 lb 14.4 oz (104.3 kg), SpO2 96%. Body mass index is 35.48 kg/m.     Physical Exam   Diagnostic studies: {Blank single:19197::none,deferred due to recent antihistamine use,deferred due to insurance stipulations that require a separate visit for testing,labs sent instead, }  Spirometry: {Blank single:19197::results normal (FEV1: ***%, FVC: ***%, FEV1/FVC: ***%),results abnormal (FEV1: ***%, FVC: ***%, FEV1/FVC: ***%)}.    {Blank single:19197::Spirometry consistent with mild obstructive disease,Spirometry consistent with moderate obstructive disease,Spirometry consistent with severe obstructive disease,Spirometry consistent with possible restrictive disease,Spirometry consistent with mixed obstructive and restrictive disease,Spirometry uninterpretable due to technique,Spirometry consistent with normal pattern}. {Blank single:19197::Albuterol /Atrovent  nebulizer,Xopenex/Atrovent nebulizer,Albuterol  nebulizer,Albuterol  four puffs via MDI,Xopenex four puffs via MDI} treatment given in clinic with {Blank single:19197::significant improvement in FEV1 per ATS criteria,significant improvement in FVC per ATS criteria,significant improvement in FEV1 and FVC per ATS criteria,improvement in FEV1, but not significant per ATS criteria,improvement in FVC, but not significant per ATS criteria,improvement in FEV1 and FVC, but not significant per ATS criteria,no improvement}.  Allergy Studies: {Blank single:19197::none,deferred due to recent antihistamine use,deferred due to insurance stipulations that require a separate visit for testing,labs sent instead, }    {Blank single:19197::Allergy testing results were read and interpreted by myself, documented by clinical staff., }         Marty Shaggy, MD [facility] 

## 2024-01-08 ENCOUNTER — Ambulatory Visit: Admitting: Rehabilitation

## 2024-01-08 DIAGNOSIS — M25552 Pain in left hip: Secondary | ICD-10-CM | POA: Diagnosis not present

## 2024-01-08 DIAGNOSIS — M5459 Other low back pain: Secondary | ICD-10-CM

## 2024-01-08 DIAGNOSIS — M6281 Muscle weakness (generalized): Secondary | ICD-10-CM

## 2024-01-08 DIAGNOSIS — R2689 Other abnormalities of gait and mobility: Secondary | ICD-10-CM

## 2024-01-08 DIAGNOSIS — M25551 Pain in right hip: Secondary | ICD-10-CM

## 2024-01-08 NOTE — Therapy (Signed)
 OUTPATIENT PHYSICAL THERAPY THORACOLUMBAR DISCHARGE   Patient Name: Kyle Delacruz MRN: [id number] DOB:[birth year], 68 y.o., male Today's Date: 01/08/2024  END OF SESSION:  PT End of Session - 01/08/24 1321     Visit Number 7    Date for PT Re-Evaluation 02/05/24    Authorization Type [employer]/ 75 visit limit PT/OT/SLP    PT Start Time 1319    PT Stop Time 1400    PT Time Calculation (min) 41 min    Activity Tolerance Patient tolerated treatment well    Behavior During Therapy WFL for tasks assessed/performed           Past Medical History:  Diagnosis Date   Allergy    Arthritis    osteo   CTS (carpal tunnel syndrome)    DJD (degenerative joint disease)    DM2 (diabetes mellitus, type 2) (HCC)    DVT (deep venous thrombosis) (HCC)    GERD (gastroesophageal reflux disease)    Gout    History of adenomatous polyps of colon 12/29/2019   History of nuclear stress test    Myoview  11/17: EF 54, no ST changes, hypertensive blood pressure response, no ischemia, low risk   Hyperlipidemia    Hypothyroidism    Nicotine addiction    OSA (obstructive sleep apnea)    Prostatitis    Reactive airway disease    Sarcocystosis    Sinus arrhythmia    Sleep apnea    cpap- not wearing   Tinea corporis    Past Surgical History:  Procedure Laterality Date   BACK SURGERY  10/2022   BILATERAL KNEE ARTHROSCOPY     right- 1997; left 2001   COLONOSCOPY  multiple last 2013   ILEOSTOMY  03/2022   Placement   IVC FILTER INSERTION N/A 09/17/2022   Procedure: IVC FILTER INSERTION;  Surgeon: Sheree Penne Bruckner, MD;  Location: [facility] INVASIVE CV LAB;  Service: Cardiovascular;  Laterality: N/A;   IVC FILTER REMOVAL N/A 01/28/2023   Procedure: IVC FILTER REMOVAL;  Surgeon: Sheree Penne Bruckner, MD;  Location: [facility] INVASIVE CV LAB;  Service: Cardiovascular;  Laterality: N/A;   KNEE SURGERY Right 2014   torn R medial meniscus- arthroscopy done   LAPAROTOMY N/A 04/10/2022    Procedure: EXPLORATORY LAPAROTOMY, TOTAL COLECTOMY, END ILEOSTOMY;  Surgeon: Lyndel Deward PARAS, MD;  Location: [facility];  Service: General;  Laterality: N/A;   PARATHYROIDECTOMY  2005   ROTATOR CUFF REPAIR  2004   right   TONSILLECTOMY     TOTAL HIP ARTHROPLASTY Left 2009   TOTAL HIP ARTHROPLASTY Right 2006   UPPER GASTROINTESTINAL ENDOSCOPY     Patient Active Problem List   Diagnosis Date Noted   History of pulmonary embolism 06/28/2022   History of DVT (deep vein thrombosis) 06/28/2022   Lumbar radiculopathy, chronic 06/28/2022   Nausea and vomiting 06/28/2022   Septic shock (HCC) 06/26/2022   SVT (supraventricular tachycardia) (HCC) 06/19/2022   PAF (paroxysmal atrial fibrillation) (HCC) 06/18/2022   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   High output ileostomy (HCC) 04/21/2022   Chronic anticoagulation 04/21/2022   Normocytic anemia 04/10/2022   Shock (HCC) 04/10/2022   Deep vein thrombosis (DVT) of femoral vein of right lower extremity (HCC) 04/09/2022   Leg DVT (deep venous thromboembolism), chronic, right (HCC) 04/08/2022   Abdominal distension 04/08/2022   Acute respiratory failure with hypoxia (HCC) 07/27/2021   PE (pulmonary thromboembolism) (HCC) 07/26/2021   Carcinoma of prostate (HCC) 07/10/2021   Chronic obstructive pulmonary disease (HCC) 07/10/2021  HLD (hyperlipidemia) 03/10/2021   Hypothyroidism 03/10/2021   Chronic bilateral back pain 03/10/2021   History of adenomatous polyps of colon 12/29/2019   Essential hypertension 06/13/2011   Obstructive sleep apnea (adult) (pediatric) 06/13/2011   Gastro-esophageal reflux disease without esophagitis 05/02/2009    PCP: Janey Santos, MD   REFERRING PROVIDER: Moffo, Katelyn Rose, PA*   REFERRING DIAG: [phone] (ICD-10-CM) - Fusion of spine, lumbar region M25.551 (ICD-10-CM) - Pain in right hip M25.552 (ICD-10-CM) - Pain in left hip  THERAPY DIAG:  Pain in left hip  Other abnormalities of gait and mobility  Muscle  weakness (generalized)  Other low back pain  Pain in right hip  RATIONALE FOR EVALUATION AND TREATMENT: Rehabilitation  ONSET DATE: 1 month ago  NEXT MD VISIT: unknown   SUBJECTIVE:                                                                                                                                                                                                         SUBJECTIVE STATEMENT: States feels a little better, but still having the increased L hip pain.  Rates pain 4/10 today.  [facility] his pain.  States feels about 45% improved over initial visit    68 y/o male referred to PT from spine surgeon at [facility] for B hip pain and muscular back pain.  Patient Has had a lot of surgeries starting with original back surgery on 04/04/22, developed PE on 04/08/22, then developed sepsis on 06/26/22, had increased back pain starting in March 2024, cage in back came loose, had hardware removed and IVC on 11/05/22.   Came here for PT and was just d/c in 3/25.    States didn't keep doing his home exercises.  States started having intermittent bilateral hip pain about 1 month ago for no apparent reason.   States pain is R posterior hip and L flank pain along the iliac crest.   States he had injections into the R buttock and L hip last Friday by Lloyd Beers and they are helping some.  States pain has caused him to have to start using his cane again. States was going to go to [employer] with family and do some pool exercise after last episode of care with our clinic, but never followed through.  Really can't give a reason for it, other than just didn't do it.  States hasn't been doing his HEP.   States he wants to be able to help out with [employer] football this fall  and wants to be able to walk better  PAIN: Are you having pain? 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 No  LIVING ENVIRONMENT: Lives with: lives with their family Lives in: House/apartment Stairs: Yes: External: 6 steps; can reach both Has following equipment at home: Single point cane  OCCUPATION: retired Sports coach  PLOF: Independent with gait  PATIENT GOALS: be able to stand up straight, not hurt   OBJECTIVE: (objective measures completed at initial evaluation unless otherwise dated)  DIAGNOSTIC FINDINGS:  11/06/22 XR Lumbar spine IMPRESSION:  1. L2-L5 posterior lumbar fusion with instrumentation and anterior  lumbar discectomy. Stable alignment.  PATIENT SURVEYS:   ODI = 15/50 LEFS = 31-80  SCREENING FOR RED FLAGS: Bowel or bladder incontinence: No Spinal tumors: No Cauda equina syndrome: No Compression fracture: No Abdominal aneurysm: No  COGNITION:  Overall cognitive status: Within functional limits for tasks assessed    SENSATION: WFL  POSTURE:  Flexed at the back and hips 30 degrees  PALPATION: TTP over the L Quadratus lumborum and along the flank above the iliac crest;  some TTP over the R piriformis  LUMBAR ROM:   Active  Eval  Flexion Mid shin; ham pull  Extension -20 deg; p!  Right lateral flexion To mild thigh  Left lateral flexion To mid thigh; L flank p!  Right rotation 30% ROM  Left rotation 20% ROM  (Blank rows = not tested)  MUSCLE LENGTH: Hamstrings: Right SLR 74 deg; Left 80 deg Thomas test: Right -20 deg; Left -15 deg Hamstrings: 90/90 RLE = -35;  LLE = -20  LOWER EXTREMITY ROM:     Active  Right eval Left eval   Hip flexion    Hip extension -15 -10  Hip abduction    Hip adduction    Hip internal rotation 15 10  Hip external rotation    Knee flexion    Knee extension -5 -3  Ankle dorsiflexion    Ankle plantarflexion    Ankle inversion    Ankle eversion    (Blank rows = not tested)  LOWER EXTREMITY MMT:    MMT Right eval Left eval RLE 8/27 LLE 8/27  Hip flexion 4 4- 4- 5  Hip extension      Hip abduction 4+ 4+ 4+ 4+  Hip adduction      Hip internal rotation   4+ 4+  Hip external rotation 4+ 4 4- 4-  Knee flexion 4 4 5 5   Knee extension 5 5 5 5   Ankle dorsiflexion 4- 2 2+ 4+  Ankle plantarflexion      Ankle inversion      Ankle eversion       (Blank rows = not tested)  LUMBAR SPECIAL TESTS:  Straight leg raise test: Negative, Slump test: Negative, Gaenslen's test: Negative, and Thomas test: Negative  FUNCTIONAL TESTS:  DGI = 14 5x STS = 14.93 sec GAIT: Distance walked: 200 feet into clinic Assistive device utilized: Single point cane Level of assistance: Complete Independence Gait pattern: decreased arm swing- Right, decreased  arm swing- Left, decreased step length- Right, decreased step length- Left, decreased stance time- Right, decreased stance time- Left, decreased stride length, knee flexed in stance- Right, knee flexed in stance- Left, and poor foot clearance- Left Comments: flexed posture, flexed hips, short shuffling gait LLE 12.43 sec/6.64m = .49 m/s   TODAY'S TREATMENT:  01/08/24 THERAPEUTIC EXERCISE: To improve strength.  Demonstration, verbal and tactile cues throughout for technique. Bike L4 x 10'  THERAPEUTIC ACTIVITIES: To improve functional performance.  Demonstration, verbal and tactile cues throughout for technique. Gait speed 2.38 ft/sec  Back extension measured at 0 degrees today Counter back extension x 15 reps SL hip abd x 20 LLE SL hip clams x 20 LLE  MANUAL THERAPY: To promote reduced pain utilizing manual TP therapy and myofascial  release. Manual and theragun STM/MFR/tp release to L quadratus, L piriformis, L gluts in R SL   01/06/24 THERAPEUTIC EXERCISE: To improve strength.  Demonstration, verbal and tactile cues throughout for technique. Bike L4 x 10'  MANUAL THERAPY: To promote improved flexibility and reduced pain utilizing manual TP therapy and myofascial release. R sidelying:  MFR to quadratus lumborum cross hand technique, muscle stripping, deep pressure over piriformis followed by theragun to QL and piriformis  01/01/24 NuStep L4 x 10'  THERAPEUTIC ACTIVITIES: To improve functional performance.  Demonstration, verbal and tactile cues throughout for technique. Standing heel raise and toe raise 2 x 10 Standing marching x 15 B Push ups with ladder x 10 SLS B Sidesteps GTB at ankles 6x no support along mat table- fatigued after this Seated ham stretch x 1' x 2 BLE  MANUAL THERAPY: To promote reduced pain utilizing myofascial release. Manual MFR to L piriformis;  Theragun to L piriformis  PATIENT EDUCATION:  Education details: standing posture, using stool PRN to sit in situations where it is possible; wearing brace below the colostomy for support for his back; standing with L foot propped on something to relieve pressure on L QL  Person educated: Patient Education method: Explanation, Demonstration, Verbal cues, Tactile cues, and MedBridgeGO app access provided Education comprehension: verbalized understanding, verbal cues required, and tactile cues required  HOME EXERCISE PROGRAM: Access Code: [facility] URL: [email or web address] Date: 12/11/2023 Prepared by: Garnette Montclair  Exercises - Hip Flexor Stretch at [facility] of Bed  - 1 x daily - 7 x weekly - 1 sets - 2 reps - 1 min hold - Supine Lower Trunk Rotation  - 1 x daily - 7 x weekly - 3 sets - 10 reps - Seated Quadratus Lumborum Stretch in Chair  - 1 x daily - 7 x weekly - 1 sets - 2 reps - 1 min hold - Wall Squat  - 1 x daily - 7 x  weekly - 3 sets - 10 reps   ASSESSMENT:  CLINICAL IMPRESSION: Patient has been seen x 7 PT visits for chronic low back and hip pain.   He initially made good progress and could see improvement, but now is having increased pain again in the L hip area from the L quadratus to the piriformis.   He has a good home exercise program that he should continue doing daily.  He has also started going to the [facility] 2 days/week doing some pool walking and exercise which we highly recommend that he continue.   He has extreme stiffness in his hips with little to no hip extension or  internal rotation and it has been this way since his THAs.  This a barrier to his  progress and he is advised to continue with his stretching program.  He has met all of his goals for PT with respect to strength and ROM improvements.  ODI has improved from 15 to 17/50.  He is independent with his HEP and is to continue daily as tolerated and call us  with any further questions.  He verbalizes understanding and agreement.  D/C PT.  EVAL:  Kyle Delacruz is a 68 y.o. male who was referred to physical therapy for evaluation and treatment for LBP and B hip pain.  HE is well known to us  from recent episode of care ending in 3/25.   He was given a comprehensive home exercise program at that time and unfortunately did not continue with it.   We recommended that he go to the pool as well, which he did not do.   He is c/o increased hip pain BLE and not so much back pain.     Patient reports onset of B hip pain beginning 1 month ago. Pain is worse with bending/lifting.  He has significant foot neuropathy with inability to do much dorsiflexion on the LLE.   HE also has flexed posture with hip and back flexion contractures.  Patient has deficits in B hip ROM, B LE flexibility, BLE strength, abnormal posture, and TTP with abnormal muscle tension the L quadratus lumborum which are interfering with ADLs and are impacting quality of life.  On Modified Oswestry  patient scored 15/50 demonstrating 30% disability.  Alexxander will benefit from skilled PT to address above deficits to improve mobility and activity tolerance with decreased pain interference.     OBJECTIVE IMPAIRMENTS: Abnormal gait, decreased balance, decreased mobility, difficulty walking, decreased ROM, decreased strength, impaired flexibility, impaired sensation, and pain.   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   REHAB POTENTIAL: Good  CLINICAL DECISION MAKING: Evolving/moderate complexity  EVALUATION COMPLEXITY: Moderate   GOALS: Goals reviewed with patient? Yes  SHORT TERM GOALS: Target date:  01/08/2024  Patient will be independent with initial HEP to improve outcomes and carryover.  Baseline: assist required for all HEP 12/23/23:  Can teach back all HEP Goal status: MET  2.  Patient will report 25% improvement in low back pain to improve QOL. Baseline: 3/10 12/25/23:  1-2/10 Goal status: MET  3.  Patient will improve in gait speed to 0.9 m/s to reduce fall risk Baseline: 0.49 m/s 8/27:  2.38 Goal status: MET   LONG TERM GOALS: Target date: 02/05/2024  Patient will be independent with ongoing/advanced HEP for self-management at home.  Baseline: no advanced HEP 12/30/23:  HEP is being advanced to more difficult activities 8/27:  can complete HEP independently Goal status: MET  2.  Patient will report 50-75% improvement in low back pain to improve QOL.  Baseline: 3/10 //12/30/23:  5/10 L hip   8/27:  4/10 L hip   Goal status: NOT MET- 01/01/24 35% improvement  3.  Patient  to demonstrate ability to achieve and maintain good spinal alignment/posturing and body mechanics needed for daily activities. Baseline: -30 degrees back extension 12/30/23:  -15 degrees today 8/27:  0 degrees with some knee flexion Goal status: MET  4.  Patient will  demonstrate -10 degrees lumbar extension to perform ADLs.   Baseline: Refer to above lumbar ROM table 12/30/23:  -15 degrees today 8/27:  0 degrees Goal status: MET  5.  Patient will demonstrate improved BLE strength to >/= 4 to 4+/5 for improved stability and ease of mobility. Baseline: Refer to above LE MMT table Goal status: MET except for R ankle DF which is chronically weak from nerve damage/foot drop  6. Patient will report </= 2% on Modified Oswestry (MCID = 12%) , and to 50/80 on LEFS to demonstrate improved functional ability with decreased pain interference. Baseline: 2/50 ODI;  31/80 LEFS 8/27: 17 / 50 = 34.0 %  Goal status: MET  7.  Patient will tolerate 30 min of (standing/sitting/walking) w/o increased pain to allow for  improved mobility and activity tolerance. 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 and did not have any pain while doing it (only had pain next day)_  PLAN:  PT FREQUENCY: 1-2x/week  PT DURATION: 8 weeks  PLANNED INTERVENTIONS: 97164- PT Re-evaluation, 97750- Physical Performance Testing, 97110-Therapeutic exercises, 97530- Therapeutic activity, [id number]- Neuromuscular re-education, 97535- Self Care, 02859- Manual therapy, [id number]- Gait training, [phone]- Electrical stimulation (unattended), 97016- Vasopneumatic device, [id number]- Ultrasound, [id number]- Ionotophoresis 4mg /ml Dexamethasone , 79439 (1-2 muscles), 20561 (3+ muscles)- Dry Needling, Patient/Family education, Balance training, Stair training, Taping, Joint mobilization, Cryotherapy, and Moist heat  PLAN FOR NEXT SESSION:   D/C TO  HEP  Mckinzey Entwistle, PT 01/08/2024, 2:10 PM  PHYSICAL THERAPY DISCHARGE SUMMARY  Visits from Start of Care: 7\  Current functional level related to goals / functional outcomes: Ambulating independently with SPC with improved gait speed to 2.38 ft/sec, ODI improved from 15/50 to 17/50 for 34%rating;  back extension has improved from -15 to 0 degrees   Remaining deficits: L hip/flank pain   Education / Equipment: Independent HEP   Patient agrees to discharge. Patient goals were met. Patient is being discharged due to maximized rehab potential.

## 2024-01-09 ENCOUNTER — Encounter: Payer: Self-pay | Admitting: Allergy & Immunology

## 2024-01-10 ENCOUNTER — Ambulatory Visit: Payer: Self-pay | Admitting: Allergy & Immunology

## 2024-01-10 LAB — ALLERGEN PROFILE, SHELLFISH
Clam IgE: <0.1 kU/L
F023-IgE Crab: 0.13 kU/L — AB
F080-IgE Lobster: 0.22 kU/L — AB
F290-IgE Oyster: <0.1 kU/L
Scallop IgE: <0.1 kU/L
Shrimp IgE: 0.28 kU/L — AB

## 2024-01-10 LAB — IGE: IgE (Immunoglobulin E), Serum: 3 [IU]/mL — ABNORMAL LOW (ref 6–495)

## 2024-01-14 DIAGNOSIS — Z87891 Personal history of nicotine dependence: Secondary | ICD-10-CM | POA: Diagnosis not present

## 2024-01-14 DIAGNOSIS — G479 Sleep disorder, unspecified: Secondary | ICD-10-CM | POA: Diagnosis not present

## 2024-01-14 DIAGNOSIS — M25552 Pain in left hip: Secondary | ICD-10-CM | POA: Diagnosis not present

## 2024-01-14 DIAGNOSIS — G4730 Sleep apnea, unspecified: Secondary | ICD-10-CM | POA: Diagnosis not present

## 2024-01-14 DIAGNOSIS — J449 Chronic obstructive pulmonary disease, unspecified: Secondary | ICD-10-CM | POA: Diagnosis not present

## 2024-01-14 DIAGNOSIS — M4326 Fusion of spine, lumbar region: Secondary | ICD-10-CM | POA: Diagnosis not present

## 2024-01-14 DIAGNOSIS — R0683 Snoring: Secondary | ICD-10-CM | POA: Diagnosis not present

## 2024-01-14 DIAGNOSIS — M47816 Spondylosis without myelopathy or radiculopathy, lumbar region: Secondary | ICD-10-CM | POA: Diagnosis not present

## 2024-01-14 DIAGNOSIS — M25551 Pain in right hip: Secondary | ICD-10-CM | POA: Diagnosis not present

## 2024-01-14 DIAGNOSIS — M791 Myalgia, unspecified site: Secondary | ICD-10-CM | POA: Diagnosis not present

## 2024-01-14 DIAGNOSIS — M961 Postlaminectomy syndrome, not elsewhere classified: Secondary | ICD-10-CM | POA: Diagnosis not present

## 2024-01-17 DIAGNOSIS — Z433 Encounter for attention to colostomy: Secondary | ICD-10-CM | POA: Diagnosis not present

## 2024-01-24 DIAGNOSIS — K631 Perforation of intestine (nontraumatic): Secondary | ICD-10-CM | POA: Diagnosis not present

## 2024-01-24 DIAGNOSIS — Z932 Ileostomy status: Secondary | ICD-10-CM | POA: Diagnosis not present

## 2024-04-01 ENCOUNTER — Ambulatory Visit: Admitting: Family Medicine

## 2024-04-01 ENCOUNTER — Encounter: Payer: Self-pay | Admitting: Family Medicine

## 2024-04-01 ENCOUNTER — Ambulatory Visit: Payer: Self-pay

## 2024-04-01 VITALS — BP 116/64 | HR 64 | Ht 67.5 in | Wt 236.2 lb

## 2024-04-01 DIAGNOSIS — R8290 Unspecified abnormal findings in urine: Secondary | ICD-10-CM | POA: Diagnosis not present

## 2024-04-01 DIAGNOSIS — R3129 Other microscopic hematuria: Secondary | ICD-10-CM | POA: Diagnosis not present

## 2024-04-01 LAB — POCT URINALYSIS DIP (MANUAL ENTRY)
Bilirubin, UA: NEGATIVE
Glucose, UA: NEGATIVE mg/dL
Nitrite, UA: NEGATIVE
Protein Ur, POC: 100 mg/dL — AB
Spec Grav, UA: 1.01 (ref 1.010–1.025)
Urobilinogen, UA: 0.2 U/dL
pH, UA: 6 (ref 5.0–8.0)

## 2024-04-01 MED ORDER — SULFAMETHOXAZOLE-TRIMETHOPRIM 800-160 MG PO TABS: 1.0000 | ORAL_TABLET | Freq: Two times a day (BID) | ORAL | 0 refills | Status: AC

## 2024-04-01 NOTE — Patient Instructions (Addendum)
 Good to see you today!  Your urine dip test showed some blood in your urine.  This may mean in infection, but we don't know for sure just yet. I sent your urine for a culture and will also get some blood tests today In the meantime I will start you on antibiotics- septra - to treat possible UTI Please let me know if you are getting worse or not seeing improvement in the next 24 hours or so!

## 2024-04-01 NOTE — Telephone Encounter (Signed)
 FYI Only or Action Required?: FYI only for provider: appointment scheduled on 11/19.  Patient was last seen in primary care on 11/12/2023 by Jason Leita Repine, FNP.  Called Nurse Triage reporting Urinary Frequency.  Symptoms began yesterday.  Interventions attempted: Rest, hydration, or home remedies.  Symptoms are: gradually worsening.  Triage Disposition: See Physician Within 24 Hours  Patient/caregiver understands and will follow disposition?: Yes  Copied from CRM #[id number]. Topic: Clinical - Red Word Triage >> Apr 01, 2024 12:12 PM Shereese L wrote: [facility] that prompted transfer to Nurse Triage: UTI, Rust colored urine and back pains Reason for Disposition  MODERATE pain (e.g., interferes with normal activities or awakens from sleep)  Answer Assessment - Initial Assessment Questions 1. SYMPTOM: What's the main symptom you're concerned about? (e.g., frequency, incontinence)     Rust colored urine- might not be very hydrated 2. ONSET: When did the  rust colored urine start?     Yesterday  3. PAIN: Is there any pain? If Yes, ask: How bad is it? (Scale: 1-10; mild, moderate, severe)     5/10 flank/ back pain- Tylenol  -- worst 7/10 4. CAUSE: What do you think is causing the symptoms?     UTI  5. OTHER SYMPTOMS: Do you have any other symptoms? (e.g., blood in urine, fever, flank pain, pain with urination)     Frequency, flank pain  Answer Assessment - Initial Assessment Questions 1. LOCATION: Where does it hurt? (e.g., left, right)     Bilateral flank pain R>L 2. ONSET: When did the pain start?     Yesterday  3. SEVERITY: How bad is the pain? (e.g., Scale 1-10; mild, moderate, or severe)     5/10 with tylenol , and 7/10 at worst 4. PATTERN: Does the pain come and go, or is it constant?      Comes and goes  5. CAUSE: What do you think is causing the pain?     UTI 6. OTHER SYMPTOMS:  Do you have any other symptoms? (e.g., fever, abdomen pain,  vomiting, leg weakness, burning with urination, blood in urine)     Frequency, rust colored urine  Protocols used: Urinary Symptoms-A-AH, Flank Pain-A-AH

## 2024-04-01 NOTE — Progress Notes (Addendum)
 [facility] [address] [place], [place] [address] [phone]  Date:  04/01/2024   Name:  Kyle Delacruz   DOB:  23-Oct-1955   MRN:  [id number]  PCP:  Janey Santos, MD    Chief Complaint: Urinary Tract Infection (Onset 03/31/2024 Marita urine and lower back pain )   History of Present Illness:  Kyle Delacruz is a 68 y.o. very pleasant male patient who presents with the following:  Patient seen today with concern of possible urinary tract infection.  He is a primary patient of Dr. Freddie have not seen him myself previously History of hypertension, DVT, PE, A-fib, SVT, sleep apnea, ileostomy, history of sepsis, COPD, prostate cancer He recently established care with pulmonology, Dr.McQuaid  He gets some of his care at the [facility] Quit smoking in 2020 He got really sick about 2 years ago and needed a total colectomy, followed by septic shock and a submassive PE  Metoprolol  Eliquis  Levothyroxine  Crestor  Lyrica  Discussed the use of AI scribe software for clinical note transcription with the patient, who gave verbal consent to proceed.  History of Present Illness Kyle Delacruz is a 68 year old male who presents with symptoms suggestive of a urinary tract infection.  He has been experiencing back pain and cloudy, brown urine for the past day and a half. The back pain occurs with certain movements, described as a sudden 'stick' when he turns a certain way. No stomach pain or pain during urination is reported. He notes increased urinary frequency, urinating at 11 AM, 1 PM, and 2 PM without significant fluid intake.  He has a history of a colectomy performed a few years ago but reports normal urinary function since then. He denies any history of kidney stones, although he recalls being told years ago that he might have had one, which resolved on its own.  Two weeks ago, he was treated at the [facility] for a serious cough  with antibiotics, cough medicine, and congestion pills. He experienced itching and swelling of the hands after taking the medication, leading him to discontinue it. He is allergic to penicillin.   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   High output ileostomy (HCC) 04/21/2022   Chronic anticoagulation 04/21/2022   Normocytic anemia 04/10/2022   Shock (HCC) 04/10/2022   Deep vein thrombosis (DVT) of femoral vein of right lower extremity (HCC) 04/09/2022   Leg DVT (deep venous thromboembolism), chronic, right (HCC) 04/08/2022   Abdominal distension 04/08/2022   Acute respiratory failure with hypoxia (HCC) 07/27/2021   PE (pulmonary thromboembolism) (HCC) 07/26/2021   Carcinoma of prostate (HCC) 07/10/2021   Chronic obstructive pulmonary disease (HCC) 07/10/2021   HLD (hyperlipidemia) 03/10/2021   Hypothyroidism 03/10/2021   Chronic bilateral back pain 03/10/2021   History of adenomatous polyps of colon 12/29/2019   Essential hypertension 06/13/2011   Obstructive sleep apnea (adult) (pediatric) 06/13/2011   Gastro-esophageal reflux disease without esophagitis 05/02/2009    Past Medical History:  Diagnosis Date   Allergy    Arthritis    osteo   CTS (carpal tunnel syndrome)    DJD (  degenerative joint disease)    DM2 (diabetes mellitus, type 2) (HCC)    DVT (deep venous thrombosis) (HCC)    GERD (gastroesophageal reflux disease)    Gout    History of adenomatous polyps of colon 12/29/2019   History of nuclear stress test    Myoview  11/17: EF 54, no ST changes, hypertensive blood pressure response, no ischemia, low risk    Hyperlipidemia    Hypothyroidism    Nicotine addiction    OSA (obstructive sleep apnea)    Prostatitis    Reactive airway disease    Sarcocystosis    Sinus arrhythmia    Sleep apnea    cpap- not wearing   Tinea corporis     Past Surgical History:  Procedure Laterality Date   BACK SURGERY  10/2022   BILATERAL KNEE ARTHROSCOPY     right- 1997; left 2001   COLONOSCOPY  multiple last 2013   ILEOSTOMY  03/2022   Placement   IVC FILTER INSERTION N/A 09/17/2022   Procedure: IVC FILTER INSERTION;  Surgeon: Sheree Penne Bruckner, MD;  Location: [facility] INVASIVE CV LAB;  Service: Cardiovascular;  Laterality: N/A;   IVC FILTER REMOVAL N/A 01/28/2023   Procedure: IVC FILTER REMOVAL;  Surgeon: Sheree Penne Bruckner, MD;  Location: [facility] INVASIVE CV LAB;  Service: Cardiovascular;  Laterality: N/A;   KNEE SURGERY Right 2014   torn R medial meniscus- arthroscopy done   LAPAROTOMY N/A 04/10/2022   Procedure: EXPLORATORY LAPAROTOMY, TOTAL COLECTOMY, END ILEOSTOMY;  Surgeon: Lyndel Deward PARAS, MD;  Location: [facility] ORS;  Service: General;  Laterality: N/A;   PARATHYROIDECTOMY  2005   ROTATOR CUFF REPAIR  2004   right   TONSILLECTOMY     TOTAL HIP ARTHROPLASTY Left 2009   TOTAL HIP ARTHROPLASTY Right 2006   UPPER GASTROINTESTINAL ENDOSCOPY      Social History   Tobacco Use   Smoking status: Former    Current packs/day: 0.00    Average packs/day: 0.5 packs/day for 45.0 years (22.5 ttl pk-yrs)    Types: Cigarettes    Start date: 83    Quit date: 2020    Years since quitting: 5.8   Smokeless tobacco: Never  Vaping Use   Vaping status: Never Used  Substance Use Topics   Alcohol use: Not Currently    Alcohol/week: 21.0 standard drinks of alcohol    Types: 21 Cans of beer per week    Comment: stopped drinking 05/03/22   Drug use: No    Family History  Problem Relation Age of Onset   Stroke Mother    Diabetes Father    Heart disease Father        sp CABG   Healthy Sister     Healthy Brother    Healthy Maternal Aunt    Healthy Maternal Uncle    Healthy Paternal Aunt    Healthy Paternal Uncle    Healthy Maternal Grandmother    Healthy Maternal Grandfather    Diabetes Paternal Grandmother    Healthy Paternal Grandfather    Healthy Other    Colon cancer Neg Hx    Stomach cancer Neg Hx    Esophageal cancer Neg Hx    Rectal cancer Neg Hx    Prostate cancer Neg Hx    Allergic rhinitis Neg Hx    Angioedema Neg Hx    Asthma Neg Hx    Atopy Neg Hx    Eczema Neg Hx    Immunodeficiency Neg Hx    Urticaria Neg Hx  Allergies  Allergen Reactions   Shellfish Allergy Anaphylaxis and Swelling   Shellfish Protein-Containing Drug Products Anaphylaxis and Swelling   Metoclopramide Other (See Comments)    Causes shaking. Sweat like crazy  Sweat like crazy    Causes shaking. Sweat like crazy   Penicillins Hives   Bee Venom Swelling   Lisinopril Other (See Comments)    Unknown  Other Reaction(s): Unknown    Medication list has been reviewed and updated.  Current Outpatient Medications on File Prior to Visit  Medication Sig Dispense Refill   albuterol  (VENTOLIN  HFA) 108 (90 Base) MCG/ACT inhaler Inhale 2 puffs into the lungs every 6 (six) hours as needed for wheezing or shortness of breath. 8 g 2   apixaban  (ELIQUIS ) 5 MG TABS tablet Take 1 tablet (5 mg total) by mouth 2 (two) times daily. 60 tablet 0   cyclobenzaprine  (FLEXERIL ) 10 MG tablet Take 10 mg by mouth as needed.     fluticasone (FLONASE) 50 MCG/ACT nasal spray Place 2 sprays into both nostrils daily.     folic acid  (FOLVITE ) 1 MG tablet Take 1 tablet (1 mg total) by mouth daily. 30 tablet 11   Iron, Ferrous Sulfate , 325 (65 Fe) MG TABS Take 1 tablet by mouth daily.     levothyroxine  (SYNTHROID , LEVOTHROID) 50 MCG tablet Take 50 mcg by mouth daily.     metoprolol  tartrate (LOPRESSOR ) 50 MG tablet TAKE 1 TABLET BY MOUTH TWICE A DAY 180 tablet 3   Multiple Vitamin (MULTIVITAMIN WITH  MINERALS) TABS tablet Take 1 tablet by mouth daily.     omeprazole (PRILOSEC) 40 MG capsule Take 40 mg by mouth 2 (two) times daily.     oxyCODONE -acetaminophen  (PERCOCET/ROXICET) 5-325 MG tablet Take 1-2 tablets by mouth every 4 (four) hours as needed for severe pain.     pregabalin (LYRICA) 150 MG capsule Take 150 mg by mouth 2 (two) times daily as needed (pain).     Probiotic Product (ALIGN) 4 MG CAPS Take 2 capsules by mouth daily. gummy     rosuvastatin  (CRESTOR ) 40 MG tablet Take 40 mg by mouth daily.     Vitamin D , Ergocalciferol , (DRISDOL) 1.25 MG (50000 UNIT) CAPS capsule Take 50,000 Units by mouth every Friday.     No current facility-administered medications on file prior to visit.    Review of Systems:  As per HPI- otherwise negative.   Physical Examination: Vitals:   04/01/24 1525  BP: 116/64  Pulse: 64  SpO2: 95%   Vitals:   04/01/24 1525  Weight: 236 lb 3.2 oz (107.1 kg)  Height: 5' 7.5 (1.715 m)   Body mass index is 36.45 kg/m. Ideal Body Weight: Weight in (lb) to have BMI = 25: 161.7  GEN: no acute distress. HEENT: Atraumatic, Normocephalic.  Ears and Nose: No external deformity. CV: RRR, No M/G/R. No JVD. No thrill. No extra heart sounds. PULM: CTA B, no wheezes, crackles, rhonchi. No retractions. No resp. distress. No accessory muscle use. ABD: S, NT, ND, +BS. No rebound. No HSM. EXTR: No c/c/e PSYCH: Normally interactive. Conversant.   Colostomy in place Belly benign, no CVA tenderness  Assessment and Plan: No diagnosis found.  Assessment & Plan Urinary tract infection with microscopic hematuria Symptoms suggest UTI; nephrolithiasis less likely due to absence of renal colic. Considered penicillin allergy and recent antibiotic reaction. - Ordered urinalysis for hematuria and infection. - Ordered urine culture to confirm infection. - Ordered blood work for liver and kidney function. - Prescribed Ceptra due  to penicillin allergy. - Instructed to  take one antibiotic tonight, then twice daily starting tomorrow. - Advised to report if symptoms worsen or persist.  Signed Harlene Schroeder, MD  Received labs as below, 11/20.  Message to patient Results for orders placed or performed in visit on 04/01/24  CBC   Collection Time: 04/01/24  3:58 PM  Result Value Ref Range   WBC 8.0 4.0 - 10.5 K/uL   RBC 4.32 4.22 - 5.81 Mil/uL   Platelets 142.0 (L) 150.0 - 400.0 K/uL   Hemoglobin 14.0 13.0 - 17.0 g/dL   HCT 57.7 60.9 - 47.9 %   MCV 97.7 78.0 - 100.0 fl   MCHC 33.1 30.0 - 36.0 g/dL   RDW 85.2 88.4 - 84.4 %  Comprehensive metabolic panel with GFR   Collection Time: 04/01/24  3:58 PM  Result Value Ref Range   Sodium 141 135 - 145 mEq/L   Potassium 3.9 3.5 - 5.1 mEq/L   Chloride 104 96 - 112 mEq/L   CO2 29 19 - 32 mEq/L   Glucose, Bld 111 (H) 70 - 99 mg/dL   BUN 14 6 - 23 mg/dL   Creatinine, Ser 8.38 (H) 0.40 - 1.50 mg/dL   Total Bilirubin 1.6 (H) 0.2 - 1.2 mg/dL   Alkaline Phosphatase 57 39 - 117 U/L   AST 16 0 - 37 U/L   ALT 14 0 - 53 U/L   Total Protein 6.4 6.0 - 8.3 g/dL   Albumin  4.1 3.5 - 5.2 g/dL   GFR 55.79 (L) >39.99 mL/min   Calcium  9.1 8.4 - 10.5 mg/dL  POCT urinalysis dipstick   Collection Time: 04/01/24  4:27 PM  Result Value Ref Range   Color, UA yellow yellow   Clarity, UA clear clear   Glucose, UA negative negative mg/dL   Bilirubin, UA negative negative   Ketones, POC UA trace (5) (A) negative mg/dL   Spec Grav, UA 8.989 8.989 - 1.025   Blood, UA large (A) negative   pH, UA 6.0 5.0 - 8.0   Protein Ur, POC =100 (A) negative mg/dL   Urobilinogen, UA 0.2 0.2 or 1.0 E.U./dL   Nitrite, UA Negative Negative   Leukocytes, UA Trace (A) Negative

## 2024-04-02 ENCOUNTER — Encounter: Payer: Self-pay | Admitting: Family Medicine

## 2024-04-02 LAB — COMPREHENSIVE METABOLIC PANEL WITH GFR
ALT: 14 U/L (ref 0–53)
AST: 16 U/L (ref 0–37)
Albumin: 4.1 g/dL (ref 3.5–5.2)
Alkaline Phosphatase: 57 U/L (ref 39–117)
BUN: 14 mg/dL (ref 6–23)
CO2: 29 meq/L (ref 19–32)
Calcium: 9.1 mg/dL (ref 8.4–10.5)
Chloride: 104 meq/L (ref 96–112)
Creatinine, Ser: 1.61 mg/dL — ABNORMAL HIGH (ref 0.40–1.50)
GFR: 44.2 mL/min — ABNORMAL LOW (ref >60.00)
Glucose, Bld: 111 mg/dL — ABNORMAL HIGH (ref 70–99)
Potassium: 3.9 meq/L (ref 3.5–5.1)
Sodium: 141 meq/L (ref 135–145)
Total Bilirubin: 1.6 mg/dL — ABNORMAL HIGH (ref 0.2–1.2)
Total Protein: 6.4 g/dL (ref 6.0–8.3)

## 2024-04-02 LAB — CBC
HCT: 42.2 % (ref 39.0–52.0)
Hemoglobin: 14 g/dL (ref 13.0–17.0)
MCHC: 33.1 g/dL (ref 30.0–36.0)
MCV: 97.7 fl (ref 78.0–100.0)
Platelets: 142 K/uL — ABNORMAL LOW (ref 150.0–400.0)
RBC: 4.32 Mil/uL (ref 4.22–5.81)
RDW: 14.7 % (ref 11.5–15.5)
WBC: 8 K/uL (ref 4.0–10.5)

## 2024-04-02 LAB — URINE CULTURE
MICRO NUMBER:: 17256777
SPECIMEN QUALITY:: ADEQUATE

## 2024-04-03 ENCOUNTER — Ambulatory Visit: Payer: Self-pay

## 2024-04-03 ENCOUNTER — Encounter: Payer: Self-pay | Admitting: Family Medicine

## 2024-04-03 NOTE — Telephone Encounter (Signed)
 FYI Only or Action Required?: Action required by provider: update on patient condition and [facility] due to no openings in PCP office.  Patient was last seen in primary care on 04/01/2024 by Copland, Harlene BROCKS, MD.  Called Nurse Triage reporting Groin Pain.  Symptoms began last night.  Interventions attempted: Nothing.  Symptoms are: unchanged.  Triage Disposition: Go to ED Now (Notify PCP)  Patient/caregiver understands and will follow disposition?: Unsure--patient states he is going to call the [facility] to try and get in there               Copied from CRM #[id number]. Topic: Clinical - Red Word Triage >> Apr 03, 2024 10:14 AM Willma R wrote: [facility] that prompted transfer to Nurse Triage: Patient states he thinks he had prostatitis, has had it before, had an aching pain on both sides of his groin. Was seen on 11/19 for a possible UTI but the symptom changed last night. Reason for Disposition . [1] Constant pain in scrotum or testicle AND [2] present > 1 hour  Answer Assessment - Initial Assessment Questions Seen on 04/01/2024 Patient said the cloudy urine has cleared up but patient states that last night he had this feeling on both sides of groin area--aching feeling & has had this in the past and had been prescribed Cipro  for prostatitis 4 months ago patient went to the [facility] in [place] and everything was okay at that time  Patient denies fever, testicle swelling/redness Patient denies any actual pain with his testicles--he states that it feels like it is inside in that area  He did mention later in triage that when he has had prostatitis it usually comes in gradually but this came on suddenly last night When patient mentioned this--this RN advised him that it is recommended that he be seen today for further evaluation When recommending Urgent Care or the ER, patient states that he is going to call the [facility]  to see about getting in there. He states that they are usually very good about getting him in to be seen. He wanted his PCP here Dr Watt to be aware of this situation as well.  Patient is advised to call us  back if anything changes or with any further questions/concerns. Patient is advised that if anything worsens to go to the Emergency Room. Patient verbalized understanding.  Protocols used: Scrotal Pain-A-AH

## 2024-04-03 NOTE — Telephone Encounter (Signed)
 Noted: patient [facility] due to no openings in PCP office.

## 2024-04-04 ENCOUNTER — Telehealth: Admitting: Family Medicine

## 2024-04-04 DIAGNOSIS — N419 Inflammatory disease of prostate, unspecified: Secondary | ICD-10-CM

## 2024-04-04 MED ORDER — CIPROFLOXACIN HCL 500 MG PO TABS: 500.0000 mg | ORAL_TABLET | Freq: Two times a day (BID) | ORAL | 0 refills | Status: AC

## 2024-04-04 NOTE — Patient Instructions (Signed)
 Wadie LELON Persons, thank you for joining Roosvelt Mater, PA-C for today's virtual visit.  While this provider is not your primary care provider (PCP), if your PCP is located in our provider database this encounter information will be shared with them immediately following your visit.   A [facility] MyChart account gives you access to today's visit and all your visits, tests, and labs performed at [facility]  click here if you don't have a [facility] MyChart account or go to [email or web address]  Consent: (Patient) Kyle Delacruz [facility] provided verbal consent for this virtual visit at the beginning of the encounter.  Current Medications:  Current Outpatient Medications:    ciprofloxacin  (CIPRO ) 500 MG tablet, Take 1 tablet (500 mg total) by mouth 2 (two) times daily for 5 days., Disp: 10 tablet, Rfl: 0   albuterol  (VENTOLIN  HFA) 108 (90 Base) MCG/ACT inhaler, Inhale 2 puffs into the lungs every 6 (six) hours as needed for wheezing or shortness of breath., Disp: 8 g, Rfl: 2   apixaban  (ELIQUIS ) 5 MG TABS tablet, Take 1 tablet (5 mg total) by mouth 2 (two) times daily., Disp: 60 tablet, Rfl: 0   cyclobenzaprine  (FLEXERIL ) 10 MG tablet, Take 10 mg by mouth as needed., Disp: , Rfl:    fluticasone (FLONASE) 50 MCG/ACT nasal spray, Place 2 sprays into both nostrils daily., Disp: , Rfl:    folic acid  (FOLVITE ) 1 MG tablet, Take 1 tablet (1 mg total) by mouth daily., Disp: 30 tablet, Rfl: 11   Iron, Ferrous Sulfate , 325 (65 Fe) MG TABS, Take 1 tablet by mouth daily., Disp: , Rfl:    levothyroxine  (SYNTHROID , LEVOTHROID) 50 MCG tablet, Take 50 mcg by mouth daily., Disp: , Rfl:    metoprolol  tartrate (LOPRESSOR ) 50 MG tablet, TAKE 1 TABLET BY MOUTH TWICE A DAY, Disp: 180 tablet, Rfl: 3   Multiple Vitamin (MULTIVITAMIN WITH MINERALS) TABS tablet, Take 1 tablet by mouth daily., Disp: , Rfl:    omeprazole (PRILOSEC) 40 MG capsule, Take 40 mg by mouth 2 (two) times daily., Disp: , Rfl:     oxyCODONE -acetaminophen  (PERCOCET/ROXICET) 5-325 MG tablet, Take 1-2 tablets by mouth every 4 (four) hours as needed for severe pain., Disp: , Rfl:    pregabalin (LYRICA) 150 MG capsule, Take 150 mg by mouth 2 (two) times daily as needed (pain)., Disp: , Rfl:    Probiotic Product (ALIGN) 4 MG CAPS, Take 2 capsules by mouth daily. gummy, Disp: , Rfl:    rosuvastatin  (CRESTOR ) 40 MG tablet, Take 40 mg by mouth daily., Disp: , Rfl:    sulfamethoxazole -trimethoprim  (BACTRIM  DS) 800-160 MG tablet, Take 1 tablet by mouth 2 (two) times daily., Disp: 14 tablet, Rfl: 0   Vitamin D , Ergocalciferol , (DRISDOL) 1.25 MG (50000 UNIT) CAPS capsule, Take 50,000 Units by mouth every Friday., Disp: , Rfl:    Medications ordered in this encounter:  Meds ordered this encounter  Medications   ciprofloxacin  (CIPRO ) 500 MG tablet    Sig: Take 1 tablet (500 mg total) by mouth 2 (two) times daily for 5 days.    Dispense:  10 tablet    Refill:  0     *If you need refills on other medications prior to your next appointment, please contact your pharmacy*  Follow-Up: Call back or seek an in-person evaluation if the symptoms worsen or if the condition fails to improve as anticipated.  [facility] [phone]  Other Instructions Prostatitis  Prostatitis is swelling of the prostate gland, also  called the prostate. This gland is about 1.5 inches wide and 1 inch high, and it helps to make a fluid called semen. The prostate is below a man's bladder, in front of the butt (rectum). There are different types of prostatitis. What are the causes? One type of prostatitis is caused by an infection from germs (bacteria). Another type is not caused by germs. It may be caused by: Things having to do with the nervous system. This system includes thebrain, spinal cord, and nerves. An autoimmune response. This happens when the body's disease-fighting system attacks healthy tissue in the body by mistake. Psychological  factors. These have to do with how the mind works. The causes of other types of prostatitis are normally not known. What are the signs or symptoms? Symptoms of this condition depend on the type of prostatitis you have. If your condition is caused by germs: You may feel pain or burning when you pee (urinate). You may pee often and all of a sudden. You may have problems starting to pee. You may have trouble emptying your bladder when you pee. You may have fever or chills. You may feel pain in your muscles, joints, low back, or lower belly. If you have other types of prostatitis: You may pee often or all of a sudden. You may have trouble starting to pee. You may have a weak flow when you pee. You may leak pee after using the bathroom. You may have other problems, such as: Abnormal fluid coming from the penis. Pain in the testicles or penis. Pain between the butt and the testicles. Pain when fluid comes out of the penis during sex. How is this treated? Treatment for this condition depends on the type of prostatitis. Treatment may include: Medicines. These may treat pain or swelling, or they may help relax muscles. Exercises to help you move better or get stronger (physical therapy). Heat therapy. Techniques to help you control some of the ways that your body works. Exercises to help you relax. Antibiotic medicine, if your condition is caused by germs. Warm water baths (sitz baths) to relax muscles. Follow these instructions at home: Medicines Take over-the-counter and prescription medicines only as told by your doctor. If you were prescribed an antibiotic medicine, take it as told by your doctor. Do not stop using the antibiotic even if you start to feel better. Managing pain and swelling  Take sitz baths as told by your doctor. For a sitz bath, sit in warm water that is deep enough to cover your hips and butt. If told, put heat on the painful area. Do this as often as told by your  doctor. Use the heat source that your doctor recommends, such as a moist heat pack or a heating pad. Place a towel between your skin and the heat source. Leave the heat on for 20-30 minutes. Take off the heat if your skin turns bright red. This is very important if you are unable to feel pain, heat, or cold. You may have a greater risk of getting burned. General instructions Do exercises as told by your doctor, if your doctor prescribed them. Keep all follow-up visits as told by your doctor. This is important. Where to find more information [employer] of Diabetes and Digestive and Kidney Diseases: lowapproval.se Contact a doctor if: Your symptoms get worse. You have a fever. Get help right away if: You have chills. You feel light-headed. You feel like you may faint. You cannot pee. You have blood or clumps  of blood (blood clots) in your pee. Summary Prostatitis is swelling of the prostate gland. There are different types of prostatitis. Treatment depends on the type that you have. Take over-the-counter and prescription medicines only as told by your doctor. Get help right away of you have chills, feel light-headed, or feel like you may faint. Also get help right away if you cannot pee or you have blood or clumps of blood in your pee. This information is not intended to replace advice given to you by your health care provider. Make sure you discuss any questions you have with your health care provider. Document Revised: 03/15/2022 Document Reviewed: 03/15/2022 Elsevier Patient Education  2024 Elsevier Inc.   If you have been instructed to have an in-person evaluation today at a local Urgent Care facility, please use the link below. It will take you to a list of all of our available [facility], including address, phone number and hours of operation. Please do not delay care.  [facility]  If you or a family member do not have a primary care  provider, use the link below to schedule a visit and establish care. When you choose a [facility] primary care physician or advanced practice provider, you gain a long-term partner in health. Find a Primary Care Provider  Learn more about [facility]'s in-office and virtual care options: [facility] - Get Care Now

## 2024-04-04 NOTE — Progress Notes (Signed)
 Virtual Visit Consent   [name] [facility], you are scheduled for a virtual visit with a [facility] provider today. Just as with appointments in the office, your consent must be obtained to participate. Your consent will be active for this visit and any virtual visit you may have with one of our providers in the next 365 days. If you have a MyChart account, a copy of this consent can be sent to you electronically.  As this is a virtual visit, video technology does not allow for your provider to perform a traditional examination. This may limit your provider's ability to fully assess your condition. If your provider identifies any concerns that need to be evaluated in person or the need to arrange testing (such as labs, EKG, etc.), we will make arrangements to do so. Although advances in technology are sophisticated, we cannot ensure that it will always work on either your end or our end. If the connection with a video visit is poor, the visit may have to be switched to a telephone visit. With either a video or telephone visit, we are not always able to ensure that we have a secure connection.  By engaging in this virtual visit, you consent to the provision of healthcare and authorize for your insurance to be billed (if applicable) for the services provided during this visit. Depending on your insurance coverage, you may receive a charge related to this service.  I need to obtain your verbal consent now. Are you willing to proceed with your visit today? Kyle Delacruz has provided verbal consent on 04/04/2024 for a virtual visit (video or telephone). Kyle Delacruz, [place]  Date: 04/04/2024 12:25 PM   Virtual Visit via Video Note   I, Kyle Delacruz, connected with  Kyle Delacruz  ([id number], 10-19-1955) on 04/04/24 at 12:15 PM EST by a video-enabled telemedicine application and verified that I am speaking with the correct person using two identifiers.  Location: Patient: Virtual Visit Location Patient:  Home Provider: Virtual Visit Location Provider: Home Office   I discussed the limitations of evaluation and management by telemedicine and the availability of in person appointments. The patient expressed understanding and agreed to proceed.    History of Present Illness: Kyle Delacruz is a 68 y.o. who identifies as a male who was assigned male at birth, and is being seen today for c/o having prostatitis.  Pt states he felt like it was creeping up again. Pt states he was prescribed Bactrim  two days ago but it is not helpful.  Pt states he was prescribed Cipro  in the past and this was more helpful. Pt states he feels the symptoms when he sits he has a bothersome feeling when he sits down. Pt denies burning with urination or penile discharge.  [facility] and was told there was no availability until March 2026.  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   High output ileostomy (HCC) 04/21/2022   Chronic anticoagulation 04/21/2022   Normocytic anemia 04/10/2022   Shock (HCC) 04/10/2022   Deep vein thrombosis (DVT) of femoral vein of right  lower extremity (HCC) 04/09/2022   Leg DVT (deep venous thromboembolism), chronic, right (HCC) 04/08/2022   Abdominal distension 04/08/2022   Acute respiratory failure with hypoxia (HCC) 07/27/2021   PE (pulmonary thromboembolism) (HCC) 07/26/2021   Carcinoma of prostate (HCC) 07/10/2021   Chronic obstructive pulmonary disease (HCC) 07/10/2021   HLD (hyperlipidemia) 03/10/2021   Hypothyroidism 03/10/2021   Chronic bilateral back pain  03/10/2021   History of adenomatous polyps of colon 12/29/2019   Essential hypertension 06/13/2011   Obstructive sleep apnea (adult) (pediatric) 06/13/2011   Gastro-esophageal reflux disease without esophagitis 05/02/2009    Allergies:  Allergies  Allergen Reactions   Shellfish Allergy Anaphylaxis and Swelling   Shellfish Protein-Containing Drug Products Anaphylaxis and Swelling   Metoclopramide Other (See Comments)    Causes shaking. Sweat like crazy  Sweat like crazy    Causes shaking. Sweat like crazy   Penicillins Hives   Bee Venom Swelling   Lisinopril Other (See Comments)    Unknown  Other Reaction(s): Unknown   Medications:  Current Outpatient Medications:    ciprofloxacin  (CIPRO ) 500 MG tablet, Take 1 tablet (500 mg total) by mouth 2 (two) times daily for 5 days., Disp: 10 tablet, Rfl: 0   albuterol  (VENTOLIN  HFA) 108 (90 Base) MCG/ACT inhaler, Inhale 2 puffs into the lungs every 6 (six) hours as needed for wheezing or shortness of breath., Disp: 8 g, Rfl: 2   apixaban  (ELIQUIS ) 5 MG TABS tablet, Take 1 tablet (5 mg total) by mouth 2 (two) times daily., Disp: 60 tablet, Rfl: 0   cyclobenzaprine  (FLEXERIL ) 10 MG tablet, Take 10 mg by mouth as needed., Disp: , Rfl:    fluticasone (FLONASE) 50 MCG/ACT nasal spray, Place 2 sprays into both nostrils daily., Disp: , Rfl:    folic acid  (FOLVITE ) 1 MG tablet, Take 1 tablet (1 mg total) by mouth daily., Disp: 30 tablet, Rfl: 11   Iron, Ferrous Sulfate , 325 (65 Fe) MG TABS, Take 1 tablet by mouth daily., Disp: , Rfl:    levothyroxine  (SYNTHROID , LEVOTHROID) 50 MCG tablet, Take 50 mcg by mouth daily., Disp: , Rfl:    metoprolol  tartrate (LOPRESSOR ) 50 MG tablet, TAKE 1 TABLET BY MOUTH TWICE A DAY, Disp: 180 tablet, Rfl: 3   Multiple Vitamin (MULTIVITAMIN WITH MINERALS) TABS tablet, Take 1 tablet by mouth daily., Disp: , Rfl:    omeprazole (PRILOSEC) 40 MG capsule, Take 40 mg by mouth 2 (two) times daily., Disp: , Rfl:     oxyCODONE -acetaminophen  (PERCOCET/ROXICET) 5-325 MG tablet, Take 1-2 tablets by mouth every 4 (four) hours as needed for severe pain., Disp: , Rfl:    pregabalin (LYRICA) 150 MG capsule, Take 150 mg by mouth 2 (two) times daily as needed (pain)., Disp: , Rfl:    Probiotic Product (ALIGN) 4 MG CAPS, Take 2 capsules by mouth daily. gummy, Disp: , Rfl:    rosuvastatin  (CRESTOR ) 40 MG tablet, Take 40 mg by mouth daily., Disp: , Rfl:    sulfamethoxazole -trimethoprim  (BACTRIM  DS) 800-160 MG tablet, Take 1 tablet by mouth 2 (two) times daily., Disp: 14 tablet, Rfl: 0   Vitamin D , Ergocalciferol , (DRISDOL) 1.25 MG (50000 UNIT) CAPS capsule, Take 50,000 Units by mouth every Friday., Disp: , Rfl:   Observations/Objective: Patient is well-developed, well-nourished in no acute distress.  Resting comfortably at home.  Head is normocephalic, atraumatic.  No labored breathing. Speech is clear and coherent with logical content.  Patient is alert and oriented at baseline.    Assessment and Plan: 1. Prostatitis,  unspecified prostatitis type (Primary) - ciprofloxacin  (CIPRO ) 500 MG tablet; Take 1 tablet (500 mg total) by mouth 2 (two) times daily for 5 days.  Dispense: 10 tablet; Refill: 0  -Stop Bactrim  and Start Ciprofloxacin  -Pt was advised to start a probiotic to avoid possible infections -Pt advised to continue follow up appointment with urology   Follow Up Instructions: I discussed the assessment and treatment plan with the patient. The patient was provided an opportunity to ask questions and all were answered. The patient agreed with the plan and demonstrated an understanding of the instructions.  A copy of instructions were sent to the patient via MyChart unless otherwise noted below.    The patient was advised to call back or seek an in-person evaluation if the symptoms worsen or if the condition fails to improve as anticipated.    Kyle Mater, PA-C

## 2024-04-20 ENCOUNTER — Encounter: Payer: Self-pay | Admitting: Family Medicine

## 2024-04-27 ENCOUNTER — Encounter (HOSPITAL_BASED_OUTPATIENT_CLINIC_OR_DEPARTMENT_OTHER): Payer: Self-pay | Admitting: Emergency Medicine

## 2024-04-27 ENCOUNTER — Other Ambulatory Visit: Payer: Self-pay

## 2024-04-27 ENCOUNTER — Emergency Department (HOSPITAL_BASED_OUTPATIENT_CLINIC_OR_DEPARTMENT_OTHER)
Admission: EM | Admit: 2024-04-27 | Discharge: 2024-04-27 | Disposition: A | Discharge status: Home / Self Care | Attending: Emergency Medicine | Admitting: Emergency Medicine

## 2024-04-27 DIAGNOSIS — N411 Chronic prostatitis: Secondary | ICD-10-CM | POA: Diagnosis not present

## 2024-04-27 DIAGNOSIS — N50811 Right testicular pain: Secondary | ICD-10-CM | POA: Diagnosis present

## 2024-04-27 DIAGNOSIS — Z7901 Long term (current) use of anticoagulants: Secondary | ICD-10-CM | POA: Diagnosis not present

## 2024-04-27 DIAGNOSIS — K6289 Other specified diseases of anus and rectum: Secondary | ICD-10-CM | POA: Diagnosis not present

## 2024-04-27 LAB — URINALYSIS, W/ REFLEX TO CULTURE (INFECTION SUSPECTED)
Bilirubin Urine: NEGATIVE
Glucose, UA: NEGATIVE mg/dL
Ketones, ur: NEGATIVE mg/dL
Leukocytes,Ua: NEGATIVE
Nitrite: NEGATIVE
Protein, ur: 30 mg/dL — AB
Specific Gravity, Urine: >=1.03 (ref 1.005–1.030)
pH: 5.5 (ref 5.0–8.0)

## 2024-04-27 MED ORDER — DIAZEPAM 5 MG PO TABS: 5.0000 mg | ORAL_TABLET | Freq: Two times a day (BID) | ORAL | 0 refills | Status: AC

## 2024-04-27 MED ORDER — DIAZEPAM 5 MG PO TABS: 5.0000 mg | ORAL_TABLET | Freq: Once | ORAL | Status: DC

## 2024-04-27 NOTE — ED Triage Notes (Signed)
 Bilateral scrotum pain and rectal pain , Hx prostatitis .  Also Hx multiple kidney stones shown on CT done last week .  Denies dysuria , yet do have difficulty urinating and emptying his bladder ,.

## 2024-04-27 NOTE — ED Provider Notes (Signed)
 [place] [facility] Provider Note   CSN: [id number] Arrival date & time: 04/27/24  [id number]     Patient presents with: Testicle Pain (bilateral)   Kyle Delacruz is a 68 y.o. male.   68 yo M with a chief complaint of sensation of not feeling well in his rectum.  He says this feels similar to when he has prostatitis.  He is currently being treated for the same.  He is on ciprofloxacin .  Says usually this makes it better.  Denies fevers.  Has had chronic low back pain he thinks is unchanged.  He has difficulty urinating at baseline.  He recently had a CT scan done at the [place].  He brought the report with him.  Shows nephrolithiasis without ureterolithiasis, no obvious acute finding.  He tells me that he was seen in the ED here about a year ago and was prescribed a medication that made his symptoms better.   Testicle Pain       Prior to Admission medications  Medication Sig Start Date End Date Taking? Authorizing Provider  diazepam  (VALIUM ) 5 MG tablet Take 1 tablet (5 mg total) by mouth 2 (two) times daily. 04/27/24  Yes Emil Share, DO  EPINEPHrine  0.3 mg/0.3 mL IJ SOAJ injection Inject 0.3 mg into the muscle as needed. 01/07/24  Yes [provider]  albuterol  (VENTOLIN  HFA) 108 (90 Base) MCG/ACT inhaler Inhale 2 puffs into the lungs every 6 (six) hours as needed for wheezing or shortness of breath. 04/22/22   Raenelle Coria, MD  apixaban  (ELIQUIS ) 5 MG TABS tablet Take 1 tablet (5 mg total) by mouth 2 (two) times daily. 09/17/22 04/01/24  Sheree Penne Bruckner, MD  cyclobenzaprine  (FLEXERIL ) 10 MG tablet Take 10 mg by mouth as needed. 10/21/23   [provider]  fluticasone (FLONASE) 50 MCG/ACT nasal spray Place 2 sprays into both nostrils daily. 10/28/23   [provider]  folic acid  (FOLVITE ) 1 MG tablet Take 1 tablet (1 mg total) by mouth daily. 12/18/23   Franchot Lauraine HERO, NP  Iron, Ferrous Sulfate , 325 (65 Fe) MG TABS Take 1  tablet by mouth daily.    [provider]  levothyroxine  (SYNTHROID , LEVOTHROID) 50 MCG tablet Take 50 mcg by mouth daily. 02/18/15   [provider]  metoprolol  tartrate (LOPRESSOR ) 50 MG tablet TAKE 1 TABLET BY MOUTH TWICE A DAY 06/07/23   Lelon Hamilton T, PA-C  Multiple Vitamin (MULTIVITAMIN WITH MINERALS) TABS tablet Take 1 tablet by mouth daily. 07/01/22   Gonfa, Taye T, MD  omeprazole (PRILOSEC) 40 MG capsule Take 40 mg by mouth 2 (two) times daily. 01/31/21   [provider]  oxyCODONE -acetaminophen  (PERCOCET/ROXICET) 5-325 MG tablet Take 1-2 tablets by mouth every 4 (four) hours as needed for severe pain.    [provider]  pregabalin (LYRICA) 150 MG capsule Take 150 mg by mouth 2 (two) times daily as needed (pain).    [provider]  Probiotic Product (ALIGN) 4 MG CAPS Take 2 capsules by mouth daily. gummy    [provider]  rosuvastatin  (CRESTOR ) 40 MG tablet Take 40 mg by mouth daily. 06/30/11   [provider]  sulfamethoxazole -trimethoprim  (BACTRIM  DS) 800-160 MG tablet Take 1 tablet by mouth 2 (two) times daily. 04/01/24   Copland, Harlene BROCKS, MD  Vitamin D , Ergocalciferol , (DRISDOL) 1.25 MG (50000 UNIT) CAPS capsule Take 50,000 Units by mouth every Friday.    [provider]    Allergies:  Shellfish allergy, Shellfish protein-containing drug products, Metoclopramide, Penicillins, Bee venom, and Lisinopril    Review of Systems  Genitourinary:  Positive for testicular pain.    Updated Vital Signs BP 110/75   Pulse 67   Temp 98 F (36.7 C)   Resp 16   Wt 104.3 kg   SpO2 97%   BMI 35.49 kg/m   Physical Exam Vitals and nursing note reviewed.  Constitutional:      Appearance: He is well-developed.  HENT:     Head: Normocephalic and atraumatic.  Eyes:     Pupils: Pupils are equal, round, and reactive to light.  Neck:     Vascular: No JVD.  Cardiovascular:     Rate and Rhythm: Normal rate and regular  rhythm.     Heart sounds: No murmur heard.    No friction rub. No gallop.  Pulmonary:     Effort: No respiratory distress.     Breath sounds: No wheezing.  Abdominal:     General: There is no distension.     Tenderness: There is no abdominal tenderness. There is no guarding or rebound.  Genitourinary:    Comments: No obvious testicular pain or edema.  Cremasteric reflex intact bilaterally.  Deferring rectal exam.  No external hemorrhoids. Musculoskeletal:        General: Normal range of motion.     Cervical back: Normal range of motion and neck supple.  Skin:    Coloration: Skin is not pale.     Findings: No rash.  Neurological:     Mental Status: He is alert and oriented to person, place, and time.  Psychiatric:        Behavior: Behavior normal.     (all labs ordered are listed, but only abnormal results are displayed) Labs Reviewed  URINALYSIS, W/ REFLEX TO CULTURE (INFECTION SUSPECTED) - Abnormal; Notable for the following components:      Result Value   Hgb urine dipstick MODERATE (*)    Protein, ur 30 (*)    Bacteria, UA FEW (*)    All other components within normal limits    EKG: None  Radiology: No results found.   Procedures   Medications Ordered in the ED - No data to display                                  Medical Decision Making Risk Prescription drug management.   68 yo M with a chief complaint of rectal pain.  Tells me it feels similar to when he had prostatitis in the past.  Currently on ciprofloxacin .  He tells me he was given a medication about a year ago that helped him with his symptoms.  On record review the patient was seen and prescribed Valium .  I confirmed this with the patient.  I will prescribe him a short course of this.  Obtain a UA here send off for culture.  Would defer to urology to change his antibiotic choice for prostatitis as this been an ongoing and recurrent issue for him.  UA without obvious infection.   9:18 AM:  I have  discussed the diagnosis/risks/treatment options with the patient.  Evaluation and diagnostic testing in the emergency department does not suggest an emergent condition requiring admission or immediate intervention beyond what has been performed at this time.  They will follow up with PCP. We also discussed returning to the ED immediately if new or worsening  sx occur. We discussed the sx which are most concerning (e.g., sudden worsening pain, fever, inability to tolerate by mouth) that necessitate immediate return. Medications administered to the patient during their visit and any new prescriptions provided to the patient are listed below.  Medications given during this visit Medications - No data to display   The patient appears reasonably screen and/or stabilized for discharge and I doubt any other medical condition or other [facility] requiring further screening, evaluation, or treatment in the ED at this time prior to discharge.       Final diagnoses:  Chronic prostatitis    ED Discharge Orders          Ordered    diazepam  (VALIUM ) 5 MG tablet  2 times daily        04/27/24 0839               Emil Share, DO 04/27/24 [phone]

## 2024-04-27 NOTE — Discharge Instructions (Signed)
 I would have you try to reach out to your urologist and let them know that you are having continued symptoms despite being on antibiotic therapy.  It looks like the last time he came here and had a probably at this send had improvement it started you on Valium .  This is a medicine that sometimes is used for muscle spasms.  You should not drive a car or operate heavy machinery or climb to tall heights while taking this medication.

## 2024-06-09 ENCOUNTER — Other Ambulatory Visit: Payer: Self-pay | Admitting: Family

## 2024-06-09 DIAGNOSIS — I2699 Other pulmonary embolism without acute cor pulmonale: Secondary | ICD-10-CM

## 2024-06-09 DIAGNOSIS — I82411 Acute embolism and thrombosis of right femoral vein: Secondary | ICD-10-CM

## 2024-06-09 DIAGNOSIS — E7211 Homocystinuria: Secondary | ICD-10-CM

## 2024-06-10 ENCOUNTER — Inpatient Hospital Stay: Admitting: Family

## 2024-06-10 ENCOUNTER — Other Ambulatory Visit: Payer: Self-pay

## 2024-06-10 ENCOUNTER — Inpatient Hospital Stay

## 2024-06-12 ENCOUNTER — Inpatient Hospital Stay: Attending: Hematology & Oncology

## 2024-06-12 ENCOUNTER — Other Ambulatory Visit: Payer: Self-pay | Admitting: Family

## 2024-06-12 ENCOUNTER — Inpatient Hospital Stay: Admitting: Family

## 2024-06-12 VITALS — BP 99/66 | HR 57 | Temp 97.7°F | Resp 19 | Ht 70.0 in | Wt 236.1 lb

## 2024-06-12 DIAGNOSIS — I2699 Other pulmonary embolism without acute cor pulmonale: Secondary | ICD-10-CM

## 2024-06-12 DIAGNOSIS — E7211 Homocystinuria: Secondary | ICD-10-CM

## 2024-06-12 DIAGNOSIS — I82411 Acute embolism and thrombosis of right femoral vein: Secondary | ICD-10-CM

## 2024-06-12 LAB — CMP (CANCER CENTER ONLY)
ALT: 11 U/L (ref 0–44)
AST: 16 U/L (ref 15–41)
Albumin: 4.3 g/dL (ref 3.5–5.0)
Alkaline Phosphatase: 66 U/L (ref 38–126)
Anion gap: 12 (ref 5–15)
BUN: 18 mg/dL (ref 8–23)
CO2: 26 mmol/L (ref 22–32)
Calcium: 10 mg/dL (ref 8.9–10.3)
Chloride: 105 mmol/L (ref 98–111)
Creatinine: 1.68 mg/dL — ABNORMAL HIGH (ref 0.61–1.24)
GFR, Estimated: 45 mL/min — ABNORMAL LOW
Glucose, Bld: 124 mg/dL — ABNORMAL HIGH (ref 70–99)
Potassium: 5.7 mmol/L — ABNORMAL HIGH (ref 3.5–5.1)
Sodium: 142 mmol/L (ref 135–145)
Total Bilirubin: 0.8 mg/dL (ref 0.0–1.2)
Total Protein: 7.2 g/dL (ref 6.5–8.1)

## 2024-06-12 LAB — CBC WITH DIFFERENTIAL (CANCER CENTER ONLY)
Abs Immature Granulocytes: 0.01 10*3/uL (ref 0.00–0.07)
Basophils Absolute: 0 10*3/uL (ref 0.0–0.1)
Basophils Relative: 0 %
Eosinophils Absolute: 0.2 10*3/uL (ref 0.0–0.5)
Eosinophils Relative: 2 %
HCT: 40.6 % (ref 39.0–52.0)
Hemoglobin: 13.3 g/dL (ref 13.0–17.0)
Immature Granulocytes: 0 %
Lymphocytes Relative: 32 %
Lymphs Abs: 2.1 10*3/uL (ref 0.7–4.0)
MCH: 30.8 pg (ref 26.0–34.0)
MCHC: 32.8 g/dL (ref 30.0–36.0)
MCV: 94 fL (ref 80.0–100.0)
Monocytes Absolute: 0.5 10*3/uL (ref 0.1–1.0)
Monocytes Relative: 7 %
Neutro Abs: 3.8 10*3/uL (ref 1.7–7.7)
Neutrophils Relative %: 59 %
Platelet Count: 144 10*3/uL — ABNORMAL LOW (ref 150–400)
RBC: 4.32 MIL/uL (ref 4.22–5.81)
RDW: 14.7 % (ref 11.5–15.5)
WBC Count: 6.6 10*3/uL (ref 4.0–10.5)
nRBC: 0 % (ref 0.0–0.2)

## 2024-06-12 NOTE — Progress Notes (Signed)
 " Hematology and Oncology Follow Up Visit  Kyle Delacruz [id number] [birth year] 69 y.o. 06/12/2024   Principle Diagnosis:  History of right lower extremity DVT and PE Hyperhomocysteine level  Elevated ferritin, reactive, hemochromatosis DNA negative   Current Therapy:   Eliquis  5 mg PO BID Folic acid  1 mg PO daily    Interim History:  [name] is here today for follow-up. He is in a great deal of pain in the right grain secondary to hip issues as well as trying to pass 3 kidney stones.  He is followed closely by urology and has had some blood in his urine due to stones.  No other blood loss noted.  No fever, chills, n/v, cough, rash, dizziness, SOB, chest pain, palpitations or changes in bowel habits.  No swelling, numbness or tingling in his extremities.  No falls or syncope.  Appetite and hydration are good. Weight is stable at 236 lbs.   ECOG Performance Status: 1 - Symptomatic but completely ambulatory  Medications:  Allergies as of 06/12/2024       Reactions   Shellfish Allergy Anaphylaxis, Swelling   Shellfish Protein-containing Drug Products Anaphylaxis, Swelling   Metoclopramide Other (See Comments)   Causes shaking. Sweat like crazy Sweat like crazy    Causes shaking. Sweat like crazy   Penicillins Hives   Bee Venom Swelling   Lisinopril Other (See Comments)   Unknown Other Reaction(s): Unknown        Medication List        Accurate as of June 12, 2024  2:07 PM. If you have any questions, ask your nurse or doctor.          albuterol  108 (90 Base) MCG/ACT inhaler Commonly known as: VENTOLIN  HFA Inhale 2 puffs into the lungs every 6 (six) hours as needed for wheezing or shortness of breath.   Align 4 MG Caps Take 2 capsules by mouth daily. gummy   apixaban  5 MG Tabs tablet Commonly known as: ELIQUIS  Take 1 tablet (5 mg total) by mouth 2 (two) times daily.   cyclobenzaprine  10 MG tablet Commonly known as: FLEXERIL  Take 10 mg by  mouth as needed.   diazepam  5 MG tablet Commonly known as: VALIUM  Take 1 tablet (5 mg total) by mouth 2 (two) times daily.   EPINEPHrine  0.3 mg/0.3 mL Soaj injection Commonly known as: EPI-PEN Inject 0.3 mg into the muscle as needed.   fluticasone 50 MCG/ACT nasal spray Commonly known as: FLONASE Place 2 sprays into both nostrils daily.   folic acid  1 MG tablet Commonly known as: FOLVITE  Take 1 tablet (1 mg total) by mouth daily.   Iron (Ferrous Sulfate ) 325 (65 Fe) MG Tabs Take 1 tablet by mouth daily.   levothyroxine  50 MCG tablet Commonly known as: SYNTHROID  Take 50 mcg by mouth daily.   metoprolol  tartrate 50 MG tablet Commonly known as: LOPRESSOR  TAKE 1 TABLET BY MOUTH TWICE A DAY   multivitamin with minerals Tabs tablet Take 1 tablet by mouth daily.   omeprazole 40 MG capsule Commonly known as: PRILOSEC Take 40 mg by mouth 2 (two) times daily.   oxyCODONE -acetaminophen  5-325 MG tablet Commonly known as: PERCOCET/ROXICET Take 1-2 tablets by mouth every 4 (four) hours as needed for severe pain.   pregabalin 150 MG capsule Commonly known as: LYRICA Take 150 mg by mouth 2 (two) times daily as needed (pain).   rosuvastatin  40 MG tablet Commonly known as: CRESTOR  Take 40 mg by mouth daily.   sulfamethoxazole -trimethoprim  800-160  MG tablet Commonly known as: BACTRIM  DS Take 1 tablet by mouth 2 (two) times daily.   Vitamin D  (Ergocalciferol ) 1.25 MG (50000 UNIT) Caps capsule Commonly known as: DRISDOL Take 50,000 Units by mouth every Friday.        Allergies: Allergies[1]  Past Medical History, Surgical history, Social history, and Family History were reviewed and updated.  Review of Systems: All other 10 point review of systems is negative.   Physical Exam:  vitals were not taken for this visit.   Wt Readings from Last 3 Encounters:  04/27/24 230 lb (104.3 kg)  04/01/24 236 lb 3.2 oz (107.1 kg)  01/07/24 229 lb 14.4 oz (104.3 kg)    Ocular:  Sclerae unicteric, pupils equal, round and reactive to light Ear-nose-throat: Oropharynx clear, dentition fair Lymphatic: No cervical or supraclavicular adenopathy Lungs no rales or rhonchi, good excursion bilaterally Heart regular rate and rhythm, no murmur appreciated Abd soft, nontender, positive bowel sounds MSK no focal spinal tenderness, no joint edema Neuro: non-focal, well-oriented, appropriate affect Breasts: Deferred   Lab Results  Component Value Date   WBC 6.6 06/12/2024   HGB 13.3 06/12/2024   HCT 40.6 06/12/2024   MCV 94.0 06/12/2024   PLT PENDING 06/12/2024   Lab Results  Component Value Date   FERRITIN 550.0 (H) 11/20/2023   IRON 167 (H) 11/20/2023   TIBC 382.2 11/20/2023   UIBC 166 06/29/2022   IRONPCTSAT 43.7 11/20/2023   Lab Results  Component Value Date   RETICCTPCT 0.4 06/29/2022   RBC 4.32 06/12/2024   No results found for: KPAFRELGTCHN, LAMBDASER, KAPLAMBRATIO No results found for: IGGSERUM, IGA, IGMSERUM No results found for: STEPHANY CARLOTA BENSON MARKEL EARLA JOANNIE DOC VICK, SPEI   Chemistry      Component Value Date/Time   NA 141 04/01/2024 1558   K 3.9 04/01/2024 1558   CL 104 04/01/2024 1558   CO2 29 04/01/2024 1558   BUN 14 04/01/2024 1558   CREATININE 1.61 (H) 04/01/2024 1558   CREATININE 1.23 12/09/2023 1144      Component Value Date/Time   CALCIUM  9.1 04/01/2024 1558   ALKPHOS 57 04/01/2024 1558   AST 16 04/01/2024 1558   AST 19 12/09/2023 1144   ALT 14 04/01/2024 1558   ALT 19 12/09/2023 1144   BILITOT 1.6 (H) 04/01/2024 1558   BILITOT 0.6 12/09/2023 1144       Impression and Plan: [name] is a very pleasant 69 yo African American gentleman with history of right lower extremity DVT as well as pulmonary embolism diagnosed in 2023 and most recently had an elevated ferritin level felt to be reactive, hemochromatosis DNA negative.  Homocysteine level was high with hyper coag  panel.  He is doing well on daily Folic acid  and he is taking his Eliquis  BID as prescribed.  Follow-up in 4 months.   Lauraine Pepper, NP 1/30/20262:07 PM     [1]  Allergies Allergen Reactions   Shellfish Allergy Anaphylaxis and Swelling   Shellfish Protein-Containing Drug Products Anaphylaxis and Swelling   Metoclopramide Other (See Comments)    Causes shaking. Sweat like crazy  Sweat like crazy    Causes shaking. Sweat like crazy   Penicillins Hives   Bee Venom Swelling   Lisinopril Other (See Comments)    Unknown  Other Reaction(s): Unknown   "

## 2024-06-13 LAB — HOMOCYSTEINE: Homocysteine: 34.3 umol/L — ABNORMAL HIGH (ref 0.0–17.2)

## 2024-06-15 MED ORDER — METOPROLOL TARTRATE 50 MG PO TABS: 50.0000 mg | ORAL_TABLET | Freq: Two times a day (BID) | ORAL | 0 refills | Status: AC

## 2024-10-09 ENCOUNTER — Inpatient Hospital Stay

## 2024-10-09 ENCOUNTER — Inpatient Hospital Stay: Admitting: Family
